# Patient Record
Sex: Male | Born: 1965 | Race: White | Hispanic: No | Marital: Single | Smoking: Former smoker
Health system: Southern US, Community
[De-identification: ages and names within clinical notes are randomized; demographics above are authoritative.]

## PROBLEM LIST (undated history)

## (undated) DIAGNOSIS — I10 Essential (primary) hypertension: Secondary | ICD-10-CM

## (undated) DIAGNOSIS — E119 Type 2 diabetes mellitus without complications: Secondary | ICD-10-CM

## (undated) DIAGNOSIS — J449 Chronic obstructive pulmonary disease, unspecified: Secondary | ICD-10-CM

## (undated) DIAGNOSIS — G4730 Sleep apnea, unspecified: Secondary | ICD-10-CM

## (undated) DIAGNOSIS — E785 Hyperlipidemia, unspecified: Secondary | ICD-10-CM

## (undated) DIAGNOSIS — K279 Peptic ulcer, site unspecified, unspecified as acute or chronic, without hemorrhage or perforation: Secondary | ICD-10-CM

## (undated) DIAGNOSIS — I2510 Atherosclerotic heart disease of native coronary artery without angina pectoris: Secondary | ICD-10-CM

## (undated) DIAGNOSIS — G629 Polyneuropathy, unspecified: Secondary | ICD-10-CM

## (undated) DIAGNOSIS — G5600 Carpal tunnel syndrome, unspecified upper limb: Secondary | ICD-10-CM

## (undated) HISTORY — DX: Type 2 diabetes mellitus without complications: E11.9

## (undated) HISTORY — DX: Hyperlipidemia, unspecified: E78.5

---

## 2004-11-05 ENCOUNTER — Emergency Department: Payer: Self-pay | Admitting: Emergency Medicine

## 2016-01-06 ENCOUNTER — Inpatient Hospital Stay: Payer: BLUE CROSS/BLUE SHIELD

## 2016-01-06 ENCOUNTER — Inpatient Hospital Stay
Admission: EM | Admit: 2016-01-06 | Discharge: 2016-01-09 | DRG: 300 | Disposition: A | Discharge status: Home / Self Care | Payer: BLUE CROSS/BLUE SHIELD | Attending: Internal Medicine | Admitting: Internal Medicine

## 2016-01-06 ENCOUNTER — Encounter: Payer: Self-pay | Admitting: Emergency Medicine

## 2016-01-06 DIAGNOSIS — L03116 Cellulitis of left lower limb: Secondary | ICD-10-CM | POA: Diagnosis present

## 2016-01-06 DIAGNOSIS — E785 Hyperlipidemia, unspecified: Secondary | ICD-10-CM | POA: Diagnosis present

## 2016-01-06 DIAGNOSIS — Z91013 Allergy to seafood: Secondary | ICD-10-CM

## 2016-01-06 DIAGNOSIS — Z6841 Body Mass Index (BMI) 40.0 and over, adult: Secondary | ICD-10-CM

## 2016-01-06 DIAGNOSIS — Z7951 Long term (current) use of inhaled steroids: Secondary | ICD-10-CM

## 2016-01-06 DIAGNOSIS — Z885 Allergy status to narcotic agent status: Secondary | ICD-10-CM | POA: Diagnosis not present

## 2016-01-06 DIAGNOSIS — Z833 Family history of diabetes mellitus: Secondary | ICD-10-CM

## 2016-01-06 DIAGNOSIS — Z8711 Personal history of peptic ulcer disease: Secondary | ICD-10-CM

## 2016-01-06 DIAGNOSIS — J449 Chronic obstructive pulmonary disease, unspecified: Secondary | ICD-10-CM | POA: Diagnosis present

## 2016-01-06 DIAGNOSIS — E662 Morbid (severe) obesity with alveolar hypoventilation: Secondary | ICD-10-CM | POA: Diagnosis present

## 2016-01-06 DIAGNOSIS — Z800 Family history of malignant neoplasm of digestive organs: Secondary | ICD-10-CM

## 2016-01-06 DIAGNOSIS — R609 Edema, unspecified: Secondary | ICD-10-CM | POA: Diagnosis not present

## 2016-01-06 DIAGNOSIS — Z7982 Long term (current) use of aspirin: Secondary | ICD-10-CM

## 2016-01-06 DIAGNOSIS — I10 Essential (primary) hypertension: Secondary | ICD-10-CM | POA: Diagnosis present

## 2016-01-06 DIAGNOSIS — I872 Venous insufficiency (chronic) (peripheral): Principal | ICD-10-CM | POA: Diagnosis present

## 2016-01-06 DIAGNOSIS — L0390 Cellulitis, unspecified: Secondary | ICD-10-CM

## 2016-01-06 DIAGNOSIS — R0600 Dyspnea, unspecified: Secondary | ICD-10-CM | POA: Diagnosis not present

## 2016-01-06 DIAGNOSIS — I2510 Atherosclerotic heart disease of native coronary artery without angina pectoris: Secondary | ICD-10-CM | POA: Diagnosis present

## 2016-01-06 DIAGNOSIS — F17210 Nicotine dependence, cigarettes, uncomplicated: Secondary | ICD-10-CM | POA: Diagnosis present

## 2016-01-06 HISTORY — DX: Chronic obstructive pulmonary disease, unspecified: J44.9

## 2016-01-06 HISTORY — DX: Atherosclerotic heart disease of native coronary artery without angina pectoris: I25.10

## 2016-01-06 HISTORY — DX: Essential (primary) hypertension: I10

## 2016-01-06 HISTORY — DX: Sleep apnea, unspecified: G47.30

## 2016-01-06 HISTORY — DX: Peptic ulcer, site unspecified, unspecified as acute or chronic, without hemorrhage or perforation: K27.9

## 2016-01-06 LAB — CBC WITH DIFFERENTIAL/PLATELET
Basophils Absolute: 0.1 10*3/uL (ref 0–0.1)
Basophils Relative: 1 %
EOS ABS: 0.3 10*3/uL (ref 0–0.7)
Eosinophils Relative: 3 %
HCT: 46.4 % (ref 40.0–52.0)
HEMOGLOBIN: 16 g/dL (ref 13.0–18.0)
LYMPHS ABS: 1.5 10*3/uL (ref 1.0–3.6)
Lymphocytes Relative: 14 %
MCH: 29.6 pg (ref 26.0–34.0)
MCHC: 34.4 g/dL (ref 32.0–36.0)
MCV: 86.2 fL (ref 80.0–100.0)
MONO ABS: 0.8 10*3/uL (ref 0.2–1.0)
MONOS PCT: 7 %
NEUTROS PCT: 75 %
Neutro Abs: 7.9 10*3/uL — ABNORMAL HIGH (ref 1.4–6.5)
Platelets: 218 10*3/uL (ref 150–440)
RBC: 5.39 MIL/uL (ref 4.40–5.90)
RDW: 14.6 % — AB (ref 11.5–14.5)
WBC: 10.5 10*3/uL (ref 3.8–10.6)

## 2016-01-06 LAB — MRSA PCR SCREENING: MRSA by PCR: NEGATIVE

## 2016-01-06 LAB — BASIC METABOLIC PANEL
Anion gap: 8 (ref 5–15)
BUN: 17 mg/dL (ref 6–20)
CALCIUM: 9.3 mg/dL (ref 8.9–10.3)
CHLORIDE: 102 mmol/L (ref 101–111)
CO2: 25 mmol/L (ref 22–32)
CREATININE: 0.96 mg/dL (ref 0.61–1.24)
GFR calc Af Amer: >60 mL/min (ref >60)
GFR calc non Af Amer: >60 mL/min (ref >60)
Glucose, Bld: 110 mg/dL — ABNORMAL HIGH (ref 65–99)
Potassium: 4.5 mmol/L (ref 3.5–5.1)
SODIUM: 135 mmol/L (ref 135–145)

## 2016-01-06 MED ORDER — MORPHINE SULFATE (PF) 2 MG/ML IV SOLN
2.0000 mg | INTRAVENOUS | Status: DC | PRN
Start: 2016-01-06 — End: 2016-01-08
  Administered 2016-01-06: 2 mg via INTRAVENOUS
  Filled 2016-01-06 (×2): qty 1

## 2016-01-06 MED ORDER — HEPARIN SODIUM (PORCINE) 5000 UNIT/ML IJ SOLN
5000.0000 [IU] | Freq: Three times a day (TID) | INTRAMUSCULAR | Status: DC
  Administered 2016-01-06 – 2016-01-07 (×3): 5000 [IU] via SUBCUTANEOUS
  Filled 2016-01-06 (×3): qty 1

## 2016-01-06 MED ORDER — LOSARTAN POTASSIUM 50 MG PO TABS
50.0000 mg | ORAL_TABLET | Freq: Every day | ORAL | Status: DC
  Administered 2016-01-07 – 2016-01-09 (×3): 50 mg via ORAL
  Filled 2016-01-06 (×3): qty 1

## 2016-01-06 MED ORDER — BUPROPION HCL ER (SR) 150 MG PO TB12
150.0000 mg | ORAL_TABLET | Freq: Two times a day (BID) | ORAL | Status: DC
  Administered 2016-01-06 – 2016-01-09 (×6): 150 mg via ORAL
  Filled 2016-01-06 (×8): qty 1

## 2016-01-06 MED ORDER — LOSARTAN POTASSIUM-HCTZ 50-12.5 MG PO TABS: 1.0000 | ORAL_TABLET | Freq: Every day | ORAL | Status: DC

## 2016-01-06 MED ORDER — ASPIRIN-ACETAMINOPHEN-CAFFEINE 250-250-65 MG PO TABS: 1.0000 | ORAL_TABLET | Freq: Four times a day (QID) | ORAL | Status: DC | PRN

## 2016-01-06 MED ORDER — ATORVASTATIN CALCIUM 20 MG PO TABS
20.0000 mg | ORAL_TABLET | Freq: Every day | ORAL | Status: DC
  Administered 2016-01-07 – 2016-01-09 (×3): 20 mg via ORAL
  Filled 2016-01-06 (×3): qty 1

## 2016-01-06 MED ORDER — OXYBUTYNIN CHLORIDE ER 5 MG PO TB24
10.0000 mg | ORAL_TABLET | Freq: Every day | ORAL | Status: DC
  Administered 2016-01-06 – 2016-01-08 (×3): 10 mg via ORAL
  Filled 2016-01-06 (×3): qty 2

## 2016-01-06 MED ORDER — PIPERACILLIN-TAZOBACTAM 3.375 G IVPB 30 MIN
3.3750 g | Freq: Once | INTRAVENOUS | Status: AC
Start: 2016-01-06 — End: 2016-01-06
  Administered 2016-01-06: 3.375 g via INTRAVENOUS
  Filled 2016-01-06: qty 50

## 2016-01-06 MED ORDER — UMECLIDINIUM BROMIDE 62.5 MCG/INH IN AEPB
1.0000 | INHALATION_SPRAY | Freq: Every day | RESPIRATORY_TRACT | Status: DC
  Administered 2016-01-06 – 2016-01-09 (×4): 1 via RESPIRATORY_TRACT
  Filled 2016-01-06: qty 7

## 2016-01-06 MED ORDER — LORATADINE 10 MG PO TABS
10.0000 mg | ORAL_TABLET | Freq: Every day | ORAL | Status: DC
  Administered 2016-01-07 – 2016-01-09 (×3): 10 mg via ORAL
  Filled 2016-01-06 (×3): qty 1

## 2016-01-06 MED ORDER — VANCOMYCIN HCL IN DEXTROSE 1-5 GM/200ML-% IV SOLN
1000.0000 mg | Freq: Once | INTRAVENOUS | Status: AC
  Administered 2016-01-06: 1000 mg via INTRAVENOUS
  Filled 2016-01-06: qty 200

## 2016-01-06 MED ORDER — MORPHINE SULFATE (PF) 4 MG/ML IV SOLN
4.0000 mg | Freq: Once | INTRAVENOUS | Status: AC
Start: 2016-01-06 — End: 2016-01-06
  Administered 2016-01-06: 4 mg via INTRAVENOUS
  Filled 2016-01-06: qty 1

## 2016-01-06 MED ORDER — PIPERACILLIN-TAZOBACTAM 3.375 G IVPB
3.3750 g | Freq: Three times a day (TID) | INTRAVENOUS | Status: DC
  Administered 2016-01-06 – 2016-01-07 (×2): 3.375 g via INTRAVENOUS
  Filled 2016-01-06 (×2): qty 50

## 2016-01-06 MED ORDER — HYDROCHLOROTHIAZIDE 12.5 MG PO CAPS
12.5000 mg | ORAL_CAPSULE | Freq: Every day | ORAL | Status: DC
  Administered 2016-01-07 – 2016-01-08 (×2): 12.5 mg via ORAL
  Filled 2016-01-06 (×2): qty 1

## 2016-01-06 MED ORDER — ONDANSETRON HCL 4 MG/2ML IJ SOLN
4.0000 mg | Freq: Once | INTRAMUSCULAR | Status: AC
  Administered 2016-01-06: 4 mg via INTRAVENOUS
  Filled 2016-01-06: qty 2

## 2016-01-06 MED ORDER — VANCOMYCIN HCL 10 G IV SOLR
1250.0000 mg | Freq: Three times a day (TID) | INTRAVENOUS | Status: DC
  Administered 2016-01-06 – 2016-01-07 (×2): 1250 mg via INTRAVENOUS
  Filled 2016-01-06 (×4): qty 1250

## 2016-01-06 MED ORDER — ASPIRIN EC 325 MG PO TBEC
325.0000 mg | DELAYED_RELEASE_TABLET | Freq: Every day | ORAL | Status: DC
  Administered 2016-01-07 – 2016-01-09 (×3): 325 mg via ORAL
  Filled 2016-01-06 (×3): qty 1

## 2016-01-06 MED ORDER — OXYCODONE-ACETAMINOPHEN 5-325 MG PO TABS
1.0000 | ORAL_TABLET | ORAL | Status: DC | PRN
  Administered 2016-01-06 – 2016-01-08 (×6): 1 via ORAL
  Filled 2016-01-06 (×6): qty 1

## 2016-01-06 MED ORDER — PANTOPRAZOLE SODIUM 40 MG PO TBEC
40.0000 mg | DELAYED_RELEASE_TABLET | Freq: Two times a day (BID) | ORAL | Status: DC
  Administered 2016-01-06 – 2016-01-09 (×6): 40 mg via ORAL
  Filled 2016-01-06 (×6): qty 1

## 2016-01-06 MED ORDER — ACETAMINOPHEN 325 MG PO TABS: 650.0000 mg | ORAL_TABLET | Freq: Four times a day (QID) | ORAL | Status: DC | PRN

## 2016-01-06 MED ORDER — MOMETASONE FURO-FORMOTEROL FUM 200-5 MCG/ACT IN AERO
2.0000 | INHALATION_SPRAY | Freq: Two times a day (BID) | RESPIRATORY_TRACT | Status: DC
  Administered 2016-01-06 – 2016-01-09 (×6): 2 via RESPIRATORY_TRACT
  Filled 2016-01-06: qty 8.8

## 2016-01-06 MED ORDER — MORPHINE SULFATE (PF) 2 MG/ML IV SOLN
INTRAVENOUS | Status: DC
Start: 2016-01-06 — End: 2016-01-06
  Filled 2016-01-06: qty 1

## 2016-01-06 NOTE — ED Provider Notes (Signed)
[facility] Emergency Department Provider Note  ____________________________________________   First MD Initiated Contact with Patient 01/06/16 1142     (approximate)  I have reviewed the triage vital signs and the nursing notes.   HISTORY  Chief Complaint Leg Swelling   HPI [name] is a 50 y.o. male With a history of CAD as well as obesity, hypertension and venous insufficiency was presenting with left lower extremity swelling over the past 3-4 days. He says that the issue started with weeping from an anterior and posterior wound 3 days ago. Since then he has had erythema which is expanded up past his knee on the left. He denies fever. He says that there is a burning type pain. Says that he has had swelling in his lower extremities for years but has never had an issue quite as bad as this before.   Past Medical History:  Diagnosis Date  . COPD (chronic obstructive pulmonary disease) ([place])   . Coronary artery disease   . Hypertension     There are no active problems to display for this patient.   History reviewed. No pertinent surgical history.  Prior to Admission medications   Not on File    Allergies Shellfish allergy and Codeine  History reviewed. No pertinent family history.  Social History Social History  Substance Use Topics  . Smoking status: Current Every Day Smoker    Packs/day: 1.00    Years: 30.00    Types: Cigarettes  . Smokeless tobacco: Never Used  . Alcohol use No    Review of Systems Constitutional: No fever/chills Eyes: No visual changes. ENT: No sore throat. Cardiovascular: Denies chest pain. Respiratory: Denies shortness of breath. Gastrointestinal: No abdominal pain.  No nausea, no vomiting.  No diarrhea.  No constipation. Genitourinary: Negative for dysuria. Musculoskeletal: Negative for back pain. Skin: as above Neurological: Negative for headaches, focal weakness or numbness.  10-point ROS  otherwise negative.  ____________________________________________   PHYSICAL EXAM:  VITAL SIGNS: ED Triage Vitals  Enc Vitals Group     BP 01/06/16 1146 (!) 177/117     Pulse Rate 01/06/16 1146 93     Resp 01/06/16 1146 (!) 21     Temp 01/06/16 1146 97.9 F (36.6 C)     Temp Source 01/06/16 1146 Oral     SpO2 01/06/16 1146 99 %     Weight 01/06/16 1145 (!) 316 lb (143.3 kg)     Height 01/06/16 1145 5\' 9"  (1.753 m)     Head Circumference --      Peak Flow --      Pain Score 01/06/16 1146 7     Pain Loc --      Pain Edu? --      Excl. in [place]? --     Constitutional: Alert and oriented. Well appearing and in no acute distress. Eyes: Conjunctivae are normal. PERRL. EOMI. Head: Atraumatic. Nose: No congestion/rhinnorhea. Mouth/Throat: Mucous membranes are moist.   Neck: No stridor.   Cardiovascular: Normal rate, regular rhythm. Grossly normal heart sounds.  Good peripheral circulation equal, intact bilateral dorsalis pedis pulses. Respiratory: Normal respiratory effort.  No retractions. Lungs CTAB. Gastrointestinal: Soft and nontender. No distention.  Musculoskeletal:  Moderate right lower extremity edema.Moderate to severe left lower extremity edema with erythema from the left foot to just proximal to left knee. There is a weeping patch of skin that appears to be weeping pus that is anterior over the tibia just proximal to the Right  lower extremity just proximal to the ankle and about 5 x 6 cm square. There is also a posterior patch that appears similarly that is 10 x 12 cm and also weeping. There is no crepitus. Pain is not out of proportion to exam. Neurologic:  Normal speech and language. No gross focal neurologic deficits are appreciated. No gait instability. Skin:  Skin is warm, dry and intact. No rash noted. Psychiatric: Mood and affect are normal. Speech and behavior are normal.  ____________________________________________   LABS (all labs ordered are listed, but only  abnormal results are displayed)  Labs Reviewed  CBC WITH DIFFERENTIAL/PLATELET - Abnormal; Notable for the following:       Result Value   RDW 14.6 (*)    Neutro Abs 7.9 (*)    All other components within normal limits  BASIC METABOLIC PANEL - Abnormal; Notable for the following:    Glucose, Bld 110 (*)    All other components within normal limits  CULTURE, BLOOD (ROUTINE X 2)  CULTURE, BLOOD (ROUTINE X 2)   ____________________________________________  EKG   ____________________________________________  RADIOLOGY   ____________________________________________   PROCEDURES  Procedure(s) performed:   Procedures  Critical Care performed:   ____________________________________________   INITIAL IMPRESSION / ASSESSMENT AND PLAN / ED COURSE  Pertinent labs & imaging results that were available during my care of the patient were reviewed by me and considered in my medical decision making (see chart for details).   Clinical Course   ----------------------------------------- 12:59 PM on 01/06/2016 -----------------------------------------  Patient signed out to Dr. Anselm Jungling. Likely cellulitis. However, normal white blood cell count. We  We'll test for blood clot as well. Patient were made for admission. Likely cellulitis is greater than 50% of the left lower extremity.   ____________________________________________   FINAL CLINICAL IMPRESSION(S) / ED DIAGNOSES  Cellulitis.    NEW MEDICATIONS STARTED DURING THIS VISIT:  New Prescriptions   No medications on file     Note:  This document was prepared using Dragon voice recognition software and may include unintentional dictation errors.    Orbie Pyo, MD 01/06/16 1300

## 2016-01-06 NOTE — ED Notes (Signed)
Patient transported to Ultrasound 

## 2016-01-06 NOTE — ED Notes (Signed)
This nurse attempted to start an IV x2 for patient unsuccessfully.

## 2016-01-06 NOTE — H&P (Addendum)
[facility] [place] NAME: [name]    MR#:  [id number]  DATE OF BIRTH:  Jun 12, 1965  DATE OF ADMISSION:  01/06/2016  PRIMARY CARE PHYSICIAN: No PCP Per Patient   REQUESTING/REFERRING PHYSICIAN: [place]  CHIEF COMPLAINT:   Chief Complaint  Patient presents with  . Leg Swelling    HISTORY OF PRESENT ILLNESS: [name]  is a 50 y.o. male with a known history of COPD, coronary artery disease, hypertension, peptic ulcer disease, sleep apnea- was having swelling on his legs for few days but for last 3 days his left leg started getting red and it was tender so he decided to come to emergency room with that. He was noted to have redness up to his knee on left leg and there was some superficial abscess on that leg so he is given his admission to hospitalist team.  PAST MEDICAL HISTORY:   Past Medical History:  Diagnosis Date  . COPD (chronic obstructive pulmonary disease) ([place])   . Coronary artery disease   . Hypertension   . Peptic ulcer   . Sleep apnea     PAST SURGICAL HISTORY: History reviewed. No pertinent surgical history.  SOCIAL HISTORY:  Social History  Substance Use Topics  . Smoking status: Current Every Day Smoker    Packs/day: 1.00    Years: 30.00    Types: Cigarettes  . Smokeless tobacco: Never Used  . Alcohol use No    FAMILY HISTORY:  Family History  Problem Relation Age of Onset  . Diabetes Mother   . Diabetes Father   . Stomach cancer Father     DRUG ALLERGIES:  Allergies  Allergen Reactions  . Shellfish Allergy Swelling  . Codeine Rash    REVIEW OF SYSTEMS:   CONSTITUTIONAL: No fever, fatigue or weakness.  EYES: No blurred or double vision.  EARS, NOSE, AND THROAT: No tinnitus or ear pain.  RESPIRATORY: No cough, shortness of breath, wheezing or hemoptysis.  CARDIOVASCULAR: No chest pain, orthopnea, edema.  GASTROINTESTINAL: No nausea, vomiting, diarrhea or abdominal pain.   GENITOURINARY: No dysuria, hematuria.  ENDOCRINE: No polyuria, nocturia,  HEMATOLOGY: No anemia, easy bruising or bleeding SKIN: No rash or lesion. MUSCULOSKELETAL: No joint pain or arthritis.  He has pain in left leg. NEUROLOGIC: No tingling, numbness, weakness.  PSYCHIATRY: No anxiety or depression.   MEDICATIONS AT HOME:  Prior to Admission medications   Medication Sig Start Date End Date Taking? Authorizing Provider  aspirin 325 MG EC tablet Take 325 mg by mouth daily.   Yes Historical Provider, MD  aspirin-acetaminophen-caffeine (EXCEDRIN MIGRAINE) [phone] MG tablet Take by mouth every 6 (six) hours as needed for headache.   Yes Historical Provider, MD  atorvastatin (LIPITOR) 20 MG tablet Take 20 mg by mouth daily.   Yes Historical Provider, MD  buPROPion (WELLBUTRIN SR) 150 MG 12 hr tablet Take 150 mg by mouth 2 (two) times daily.   Yes Historical Provider, MD  Fluticasone-Salmeterol (ADVAIR DISKUS) 250-50 MCG/DOSE AEPB Inhale 1 puff into the lungs 2 (two) times daily.   Yes Historical Provider, MD  loratadine (CLARITIN) 10 MG tablet Take 10 mg by mouth daily.   Yes Historical Provider, MD  losartan-hydrochlorothiazide (HYZAAR) 50-12.5 MG tablet Take 1 tablet by mouth daily.   Yes Historical Provider, MD  omeprazole (PRILOSEC) 20 MG capsule Take 20 mg by mouth 2 (two) times daily.   Yes Historical Provider, MD  oxybutynin (DITROPAN-XL) 10 MG 24 hr tablet Take  10 mg by mouth at bedtime.   Yes Historical Provider, MD  Potassium Gluconate 550 MG TABS Take 1 tablet by mouth daily.   Yes Historical Provider, MD  umeclidinium bromide (INCRUSE ELLIPTA) 62.5 MCG/INH AEPB Inhale 1 puff into the lungs daily.   Yes Historical Provider, MD      PHYSICAL EXAMINATION:   VITAL SIGNS: Blood pressure 137/78, pulse 96, temperature 97.8 F (36.6 C), temperature source Oral, resp. rate (!) 24, height 5\' 9"  (1.753 m), weight (!) 143.3 kg (316 lb), SpO2 97 %.  GENERAL:  50 y.o.-year-old Obese  patient lying in the bed with no acute distress.  EYES: Pupils equal, round, reactive to light and accommodation. No scleral icterus. Extraocular muscles intact.  HEENT: Head atraumatic, normocephalic. Oropharynx and nasopharynx clear.  NECK:  Supple, no jugular venous distention. No thyroid enlargement, no tenderness.  LUNGS: Normal breath sounds bilaterally, no wheezing, rales,rhonchi or crepitation. No use of accessory muscles of respiration.  CARDIOVASCULAR: S1, S2 normal. No murmurs, rubs, or gallops.  ABDOMEN: Soft, nontender, nondistended. Bowel sounds present. No organomegaly or mass.  EXTREMITIES: Bilateral pedal edema, no cyanosis, left leg has redness up to his knee with some superficial skin breakdown in anterior part of middle part of her leg with open ulcer-like area and superficial yellowish discharge in the size of 5 x 6 cm.  NEUROLOGIC: Cranial nerves II through XII are intact. Muscle strength 5/5 in all extremities. Sensation intact. Gait not checked.  PSYCHIATRIC: The patient is alert and oriented x 3.  SKIN: No obvious rash, lesion, or ulcer.   LABORATORY PANEL:   CBC  Recent Labs Lab 01/06/16 1205  WBC 10.5  HGB 16.0  HCT 46.4  PLT 218  MCV 86.2  MCH 29.6  MCHC 34.4  RDW 14.6*  LYMPHSABS 1.5  MONOABS 0.8  EOSABS 0.3  BASOSABS 0.1   ------------------------------------------------------------------------------------------------------------------  Chemistries   Recent Labs Lab 01/06/16 1205  NA 135  K 4.5  CL 102  CO2 25  GLUCOSE 110*  BUN 17  CREATININE 0.96  CALCIUM 9.3   ------------------------------------------------------------------------------------------------------------------ estimated creatinine clearance is 129.8 mL/min (by C-G formula based on SCr of 0.96 mg/dL). ------------------------------------------------------------------------------------------------------------------ No results for input(s): TSH, T4TOTAL, T3FREE, THYROIDAB  in the last 72 hours.  Invalid input(s): FREET3   Coagulation profile No results for input(s): INR, PROTIME in the last 168 hours. ------------------------------------------------------------------------------------------------------------------- No results for input(s): DDIMER in the last 72 hours. -------------------------------------------------------------------------------------------------------------------  Cardiac Enzymes No results for input(s): CKMB, TROPONINI, MYOGLOBIN in the last 168 hours.  Invalid input(s): CK ------------------------------------------------------------------------------------------------------------------ Invalid input(s): POCBNP  ---------------------------------------------------------------------------------------------------------------  Urinalysis No results found for: COLORURINE, APPEARANCEUR, LABSPEC, PHURINE, GLUCOSEU, HGBUR, BILIRUBINUR, KETONESUR, PROTEINUR, UROBILINOGEN, NITRITE, LEUKOCYTESUR   RADIOLOGY: US Venous Img Lower Unilateral Left  Result Date: 01/06/2016 CLINICAL DATA:  Left lower extremity swelling and erythema since last night EXAM: LEFT LOWER EXTREMITY VENOUS DUPLEX ULTRASOUND TECHNIQUE: Doppler venous assessment of the left lower extremity deep venous system was performed, including characterization of spectral flow, compressibility, and phasicity. COMPARISON:  None. FINDINGS: There is complete compressibility of the left common femoral, femoral, and popliteal veins. Doppler analysis demonstrates respiratory phasicity and augmentation of flow with calf compression. No obvious superficial vein or calf vein thrombosis. IMPRESSION: No evidence of left lower extremity DVT. Electronically Signed   By: Marybelle Killings M.D.   On: 01/06/2016 15:02    EKG: No orders found for this or any previous visit.  IMPRESSION AND PLAN:  * Cellulitis   IV vancomycin and  Zosyn for now.   Check for MRSA screen.    Check ultrasound DVT  studies.  *  COPD   No active exacerbation   Continue inhalers.  * Hypertension   Continue home medication.  * Hyperlipidemia   Continue atorvastatin.  * Active smoking   Counseled to quit smoking for 4 minutes and offered nicotine patch.  All the records are reviewed and case discussed with ED provider. Management plans discussed with the patient, family and they are in agreement.  CODE STATUS:Full code     Code Status Orders        Start     Ordered   01/06/16 1526  Full code  Continuous     01/06/16 1525    Code Status History    Date Active Date Inactive Code Status Order ID Comments User Context   This patient has a current code status but no historical code status.     Patient's sister was present in the room during my visit.   TOTAL TIME TAKING CARE OF THIS PATIENT: 50 min.    Vaughan Basta M.D on 01/06/2016   Between 7am to 6pm - Pager - [phone]  After 6pm go to [email or web address] - password [email or web address] [place] [facility]  Office  [phone]  CC: Primary care physician; No PCP Per Patient   Note: This dictation was prepared with Dragon dictation along with smaller phrase technology. Any transcriptional errors that result from this process are unintentional.

## 2016-01-06 NOTE — ED Triage Notes (Signed)
Pt presents to ED c/o left leg swelling, weeping since last night. Warm to touch with redness; oozing present from site. Denies fever.

## 2016-01-06 NOTE — Progress Notes (Signed)
Pharmacy Antibiotic Note  [name] is a 50 y.o. male admitted on 01/06/2016 with  cellulitis.  Pharmacy has been consulted for Vancomycin and Zosyn dosing. Patient received Vancomycin 1gm IV x1 and Zosyn 3.375 IV x 1 in ED.   Plan: DW: 100kg  Ke: 0.104  Vd:70   T1/2: 6.7  Will start patient on Vancomycin 1250mg  IV every 8 hours with 6 hours stack dosing. Calculated trough at Css 14. Trough ordered prior to 5th dose. Will monitor renal function and adjust dose as needed.    Will start patient on Zosyn 3.375 IV EI every 8 hours.   Height: 5\' 9"  ([id number] cm) Weight: (!) 316 lb (143.3 kg) IBW/kg (Calculated) : 70.7  Temp (24hrs), Avg:97.9 F (36.6 C), Min:97.9 F (36.6 C), Max:97.9 F (36.6 C)   Recent Labs Lab 01/06/16 1205  WBC 10.5  CREATININE 0.96    Estimated Creatinine Clearance: 129.8 mL/min (by C-G formula based on SCr of 0.96 mg/dL).    Allergies  Allergen Reactions  . Shellfish Allergy Swelling  . Codeine Rash    Antimicrobials this admission: 12/25 Vancomycin  >>  12/25 Zosyn  >>   Dose adjustments this admission:  Microbiology results: 12/25 BCx:   Thank you for allowing pharmacy to be a part of this patient's care.  Pernell Dupre, PharmD, BCPS Clinical Pharmacist 01/06/2016 2:28 PM

## 2016-01-07 LAB — BASIC METABOLIC PANEL
Anion gap: 6 (ref 5–15)
BUN: 14 mg/dL (ref 6–20)
CO2: 29 mmol/L (ref 22–32)
CREATININE: 1.15 mg/dL (ref 0.61–1.24)
Calcium: 9 mg/dL (ref 8.9–10.3)
Chloride: 103 mmol/L (ref 101–111)
GFR calc Af Amer: >60 mL/min (ref >60)
Glucose, Bld: 113 mg/dL — ABNORMAL HIGH (ref 65–99)
Potassium: 4.8 mmol/L (ref 3.5–5.1)
Sodium: 138 mmol/L (ref 135–145)

## 2016-01-07 LAB — CBC
HCT: 43.7 % (ref 40.0–52.0)
Hemoglobin: 14.8 g/dL (ref 13.0–18.0)
MCH: 29.1 pg (ref 26.0–34.0)
MCHC: 33.7 g/dL (ref 32.0–36.0)
MCV: 86.1 fL (ref 80.0–100.0)
PLATELETS: 209 10*3/uL (ref 150–440)
RBC: 5.08 MIL/uL (ref 4.40–5.90)
RDW: 14.8 % — AB (ref 11.5–14.5)
WBC: 9.2 10*3/uL (ref 3.8–10.6)

## 2016-01-07 MED ORDER — DEXTROSE 5 % IV SOLN
1.0000 g | INTRAVENOUS | Status: DC
  Filled 2016-01-07: qty 10

## 2016-01-07 MED ORDER — VANCOMYCIN HCL 10 G IV SOLR
1500.0000 mg | Freq: Two times a day (BID) | INTRAVENOUS | Status: DC
  Administered 2016-01-07 – 2016-01-08 (×2): 1500 mg via INTRAVENOUS
  Filled 2016-01-07 (×3): qty 1500

## 2016-01-07 MED ORDER — ASPIRIN-ACETAMINOPHEN-CAFFEINE 250-250-65 MG PO TABS
2.0000 | ORAL_TABLET | Freq: Four times a day (QID) | ORAL | Status: DC | PRN
  Administered 2016-01-07 – 2016-01-09 (×3): 2 via ORAL
  Filled 2016-01-07 (×6): qty 2

## 2016-01-07 MED ORDER — CEFTRIAXONE SODIUM-DEXTROSE 1-3.74 GM-% IV SOLR
1.0000 g | INTRAVENOUS | Status: DC
  Administered 2016-01-07: 1 g via INTRAVENOUS
  Filled 2016-01-07 (×2): qty 50

## 2016-01-07 MED ORDER — NICOTINE 21 MG/24HR TD PT24
21.0000 mg | MEDICATED_PATCH | Freq: Every day | TRANSDERMAL | Status: DC
  Administered 2016-01-07: 21 mg via TRANSDERMAL
  Filled 2016-01-07 (×2): qty 1

## 2016-01-07 MED ORDER — ENOXAPARIN SODIUM 40 MG/0.4ML ~~LOC~~ SOLN
40.0000 mg | Freq: Two times a day (BID) | SUBCUTANEOUS | Status: DC
  Administered 2016-01-07 – 2016-01-09 (×4): 40 mg via SUBCUTANEOUS
  Filled 2016-01-07 (×4): qty 0.4

## 2016-01-07 NOTE — Progress Notes (Signed)
Pharmacy Antibiotic Note  [name] is a 50 y.o. male admitted on 01/06/2016 with  cellulitis.  Pharmacy has been consulted for Vancomycin and Zosyn dosing. Patient received Vancomycin 1gm IV x1 and Zosyn 3.375 IV x 1 in ED.   Plan: DW: 100kg  Ke: 0.104  Vd:70   T1/2: 6.7 Will start patient on Vancomycin 1250mg  IV every 8 hours with 6 hours stack dosing. Calculated trough at Css 14. Trough ordered prior to 5th dose. Will monitor renal function and adjust dose as needed.     12/26: SCr trending up leading to significant change in CrCl, pt at risk for dose accumulation, and current dose suggests trough level >20. Will adjust dose to vancomycin 1500 mg IV q12h. Trough before 3rd dose 12/27 at 1630 to see where we are with pt previously receiving q8h dosing (will not be at Css). Ke 0.069, half life 10 h, Vd 70 L.    Will continue Zosyn 3.375 IV EI every 8 hours.    Height: 5\' 9"  (175.3 cm) Weight: (!) 316 lb (143.3 kg) IBW/kg (Calculated) : 70.7  Temp (24hrs), Avg:98.1 F (36.7 C), Min:97.8 F (36.6 C), Max:98.4 F (36.9 C)   Recent Labs Lab 01/06/16 1205 01/07/16 0500  WBC 10.5 9.2  CREATININE 0.96 1.15    Estimated Creatinine Clearance: 108.4 mL/min (by C-G formula based on SCr of 1.15 mg/dL).    Allergies  Allergen Reactions  . Shellfish Allergy Swelling  . Codeine Rash    Antimicrobials this admission: 12/25 Vancomycin  >>  12/25 Zosyn  >>   Dose adjustments this admission:  Microbiology results: 12/25 BCx: NGTD  Thank you for allowing pharmacy to be a part of this patient's care.  Rocky Morel, PharmD, BCPS Clinical Pharmacist 01/07/2016 11:04 AM

## 2016-01-07 NOTE — Progress Notes (Signed)
Patient ID: [name], male   DOB: Jun 05, 1965, 50 y.o.   MRN: [id number]  [employer] [facility] PROGRESS NOTE  SEVAG DIEBEL [id number] DOB: [birth year] DOA: 01/06/2016 PCP: No PCP Per Patient  HPI/Subjective: Patient feeling okay. Had severe burning and pain in his legs. He noticed his legs cracking and then infection happened in his left leg. He had a lot of drainage prior to coming into the hospital. This morning there was less drainage. His legs were covered with Unna boots when I saw him.  Objective: Vitals:   01/07/16 0501 01/07/16 1253  BP: 125/73 134/71  Pulse: 97 94  Resp:  (!) 21  Temp: 98.4 F (36.9 C) 98.3 F (36.8 C)    Filed Weights   01/06/16 1145  Weight: (!) 143.3 kg (316 lb)    ROS: Review of Systems  Constitutional: Negative for chills and fever.  Eyes: Negative for blurred vision.  Respiratory: Positive for shortness of breath. Negative for cough.   Cardiovascular: Negative for chest pain.  Gastrointestinal: Negative for abdominal pain, constipation, diarrhea, nausea and vomiting.  Genitourinary: Negative for dysuria.  Musculoskeletal: Positive for myalgias. Negative for joint pain.  Neurological: Negative for dizziness and headaches.   Exam: Physical Exam  Constitutional: He is oriented to person, place, and time.  HENT:  Nose: No mucosal edema.  Mouth/Throat: No oropharyngeal exudate or posterior oropharyngeal edema.  Eyes: Conjunctivae, EOM and lids are normal. Pupils are equal, round, and reactive to light.  Neck: No JVD present. Carotid bruit is not present. No edema present. No thyroid mass and no thyromegaly present.  Cardiovascular: S1 normal and S2 normal.  Exam reveals no gallop.   No murmur heard. Pulses:      Dorsalis pedis pulses are 2+ on the right side, and 2+ on the left side.  Respiratory: No respiratory distress. He has decreased breath sounds in the right lower field and the left lower field. He has no wheezes. He has no  rhonchi. He has no rales.  GI: Soft. Bowel sounds are normal. There is no tenderness.  Musculoskeletal:       Right ankle: He exhibits swelling.       Left ankle: He exhibits swelling.  Lymphadenopathy:    He has no cervical adenopathy.  Neurological: He is alert and oriented to person, place, and time. No cranial nerve deficit.  Skin: Skin is warm. Nails show no clubbing.  Legs covered by Louretta Parma boot  Psychiatric: He has a normal mood and affect.      Data Reviewed: Basic Metabolic Panel:  Recent Labs Lab 01/06/16 1205 01/07/16 0500  NA 135 138  K 4.5 4.8  CL 102 103  CO2 25 29  GLUCOSE 110* 113*  BUN 17 14  CREATININE 0.96 1.15  CALCIUM 9.3 9.0  CBC:  Recent Labs Lab 01/06/16 1205 01/07/16 0500  WBC 10.5 9.2  NEUTROABS 7.9*  --   HGB 16.0 14.8  HCT 46.4 43.7  MCV 86.2 86.1  PLT 218 209     Recent Results (from the past 240 hour(s))  Blood culture (routine x 2)     Status: None (Preliminary result)   Collection Time: 01/06/16 12:06 PM  Result Value Ref Range Status   Specimen Description BLOOD LEFT ANTECUBITAL  Final   Special Requests   Final    BOTTLES DRAWN AEROBIC AND ANAEROBIC  AER 10CC ANA 8CC   Culture NO GROWTH < 24 HOURS  Final   Report Status PENDING  Incomplete  Blood culture (routine x 2)     Status: None (Preliminary result)   Collection Time: 01/06/16 12:25 PM  Result Value Ref Range Status   Specimen Description BLOOD RIGHT HAND  Final   Special Requests   Final    BOTTLES DRAWN AEROBIC AND ANAEROBIC  AER [place] ANA 11CC   Culture NO GROWTH < 24 HOURS  Final   Report Status PENDING  Incomplete  MRSA PCR Screening     Status: None   Collection Time: 01/06/16  2:18 PM  Result Value Ref Range Status   MRSA by PCR NEGATIVE NEGATIVE Final    Comment:        The GeneXpert MRSA Assay (FDA approved for NASAL specimens only), is one component of a comprehensive MRSA colonization surveillance program. It is not intended to diagnose  MRSA infection nor to guide or monitor treatment for MRSA infections.      Studies: US Venous Img Lower Unilateral Left  Result Date: 01/06/2016 CLINICAL DATA:  Left lower extremity swelling and erythema since last night EXAM: LEFT LOWER EXTREMITY VENOUS DUPLEX ULTRASOUND TECHNIQUE: Doppler venous assessment of the left lower extremity deep venous system was performed, including characterization of spectral flow, compressibility, and phasicity. COMPARISON:  None. FINDINGS: There is complete compressibility of the left common femoral, femoral, and popliteal veins. Doppler analysis demonstrates respiratory phasicity and augmentation of flow with calf compression. No obvious superficial vein or calf vein thrombosis. IMPRESSION: No evidence of left lower extremity DVT. Electronically Signed   By: Marybelle Killings M.D.   On: 01/06/2016 15:02    Scheduled Meds: . aspirin  325 mg Oral Daily  . atorvastatin  20 mg Oral Daily  . buPROPion  150 mg Oral BID  . cefTRIAXone  1 g Intravenous Q24H  . enoxaparin (LOVENOX) injection  40 mg Subcutaneous Q12H  . losartan  50 mg Oral Daily   And  . hydrochlorothiazide  12.5 mg Oral Daily  . loratadine  10 mg Oral Daily  . mometasone-formoterol  2 puff Inhalation BID  . oxybutynin  10 mg Oral QHS  . pantoprazole  40 mg Oral BID  . umeclidinium bromide  1 puff Inhalation Daily  . vancomycin  1,500 mg Intravenous Q12H    Assessment/Plan:  1. 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 It's covered by Louretta Parma boot can even evaluate today. Patient on vancomycin and Zosyn and I will switch the Zosyn over to Rocephin. Need to evaluate this prior to disposition whether I take the Unna boots off tomorrow the next day will depend on clinical course. 2. History of COPD. Respiratory status stable 3. Essential hypertension continue usual medications 4. Morbid obesity weight loss needed  Code Status:     Code Status Orders        Start     Ordered    01/06/16 1526  Full code  Continuous     01/06/16 1525    Code Status History    Date Active Date Inactive Code Status Order ID Comments User Context   This patient has a current code status but no historical code status.      Disposition Plan: To be determined  Antibiotics:  Vancomycin  Rocephin  Time spent: 28 minutes  Loletha Grayer  [employer]

## 2016-01-07 NOTE — Consult Note (Signed)
[facility] Nurse wound consult note Reason for Consult: Cellulitis with edema from toes to below knee.  History of mixed venous and arterial disease. Positive pedal pulses present bilaterally.   Has worn compression garments in the past.  Will apply Unnas boots today to relieve edema.  Boil to right inner thigh near groin.  On vancomycin.  Will keep clean and covered.  Wound type:Infectious Pressure Ulcer POA: N/A Measurement:Right thigh 1 cm x 0.5 cm x 0.2 cm purulent drainage.  Left anterior lower leg 4 cm x 3 cm x 0.1 cm  LEft posterior leg 3 cm x 3 cm x 0.1 cm  Right anterior lower leg 2 cm x 2 cm blistering.  Wound [id number] discoloration to both legs.  Hemosiderin staining is present.  Drainage (amount, consistency, odor) Minimal serous weeping.  Periwound:Edema, erythema, tender to touch. Dry skin Dressing procedure/placement/frequency:Cleanse bilateral legs with soap and water.  Calcium alginate to open blistering.  ZInc layer secured with self adherent Coban.  Will remove prior to discharge.  Will not follow at this time.  Please re-consult if needed.  Domenic Moras RN BSN [place] Pager [phone]

## 2016-01-07 NOTE — Progress Notes (Signed)
Per Dr. Leslye Peer okay to place order for nicotine patch 21mg  daily

## 2016-01-07 NOTE — Progress Notes (Signed)
Pt alert and sitting up. 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 [facility] is available.   01/07/16 1130  Clinical Encounter Type  Visited With Patient  Visit Type Initial  Referral From Nurse  Spiritual Encounters  Spiritual Needs Emotional  Stress Factors  Patient Stress Factors Health changes

## 2016-01-07 NOTE — Plan of Care (Signed)
Problem: Safety: Goal: Ability to remain free from injury will improve Outcome: Progressing Re-enforced the need for the patient to call for assistance when getting out of bed, he indicated understanding

## 2016-01-08 ENCOUNTER — Inpatient Hospital Stay: Admit: 2016-01-08 | Payer: BLUE CROSS/BLUE SHIELD

## 2016-01-08 ENCOUNTER — Inpatient Hospital Stay (HOSPITAL_COMMUNITY)
Admit: 2016-01-08 | Discharge: 2016-01-08 | Disposition: A | Discharge status: Home / Self Care | Payer: BLUE CROSS/BLUE SHIELD | Attending: Internal Medicine | Admitting: Internal Medicine

## 2016-01-08 DIAGNOSIS — R0600 Dyspnea, unspecified: Secondary | ICD-10-CM

## 2016-01-08 MED ORDER — MORPHINE SULFATE (PF) 4 MG/ML IV SOLN
2.0000 mg | INTRAVENOUS | Status: DC | PRN
  Administered 2016-01-08 – 2016-01-09 (×2): 2 mg via INTRAVENOUS
  Filled 2016-01-08 (×2): qty 1

## 2016-01-08 MED ORDER — FUROSEMIDE 40 MG PO TABS
40.0000 mg | ORAL_TABLET | Freq: Every day | ORAL | Status: DC
  Administered 2016-01-08 – 2016-01-09 (×2): 40 mg via ORAL
  Filled 2016-01-08 (×2): qty 1

## 2016-01-08 MED ORDER — PERFLUTREN LIPID MICROSPHERE
1.0000 mL | INTRAVENOUS | Status: AC | PRN
  Administered 2016-01-08: 2 mL via INTRAVENOUS
  Filled 2016-01-08: qty 10

## 2016-01-08 MED ORDER — CEFAZOLIN IN D5W 1 GM/50ML IV SOLN
1.0000 g | Freq: Three times a day (TID) | INTRAVENOUS | Status: DC
  Administered 2016-01-08 – 2016-01-09 (×3): 1 g via INTRAVENOUS
  Filled 2016-01-08 (×7): qty 50

## 2016-01-08 NOTE — Progress Notes (Signed)
Pt alert and sitting in recliner. 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 [facility] is available.   01/08/16 1100  Clinical Encounter Type  Visited With Patient  Visit Type Follow-up  Referral From Chaplain  Spiritual Encounters  Spiritual Needs Emotional  Stress Factors  Patient Stress Factors None identified

## 2016-01-08 NOTE — Progress Notes (Signed)
Patient ID: [name], male   DOB: September 27, 1965, 50 y.o.   MRN: [id number]   [facility] PROGRESS NOTE  [name] [id number] DOB: July 15, 1965 DOA: 01/06/2016 PCP: No PCP Per Patient  HPI/Subjective: I had the nurse take off the Unna boots so I can evaluate his cellulitis. The patient states it started bleeding when they took it off on his left leg. He feels less pain in his calves at this point. Still has some redness on his left calf. He does have some chronic discoloration bilateral shins.  Objective: Vitals:   01/08/16 0755 01/08/16 1322  BP: (!) 119/51 138/75  Pulse: 91 84  Resp: 18 18  Temp:  98.3 F (36.8 C)    Filed Weights   01/06/16 1145  Weight: (!) 143.3 kg (316 lb)    ROS: Review of Systems  Constitutional: Negative for chills and fever.  Eyes: Negative for blurred vision.  Respiratory: Positive for shortness of breath. Negative for cough.   Cardiovascular: Negative for chest pain.  Gastrointestinal: Negative for abdominal pain, constipation, diarrhea, nausea and vomiting.  Genitourinary: Negative for dysuria.  Musculoskeletal: Positive for myalgias. Negative for joint pain.  Neurological: Negative for dizziness and headaches.   Exam: Physical Exam  Constitutional: He is oriented to person, place, and time.  HENT:  Nose: No mucosal edema.  Mouth/Throat: No oropharyngeal exudate or posterior oropharyngeal edema.  Eyes: Conjunctivae, EOM and lids are normal. Pupils are equal, round, and reactive to light.  Neck: No JVD present. Carotid bruit is not present. No edema present. No thyroid mass and no thyromegaly present.  Cardiovascular: S1 normal and S2 normal.  Exam reveals no gallop.   No murmur heard. Pulses:      Dorsalis pedis pulses are 2+ on the right side, and 2+ on the left side.  Respiratory: No respiratory distress. He has decreased breath sounds in the right lower field and the left lower field. He has no wheezes. He has no  rhonchi. He has no rales.  GI: Soft. Bowel sounds are normal. There is no tenderness.  Musculoskeletal:       Right ankle: He exhibits swelling.       Left ankle: He exhibits swelling.  Lymphadenopathy:    He has no cervical adenopathy.  Neurological: He is alert and oriented to person, place, and time. No cranial nerve deficit.  Skin: Skin is warm. Nails show no clubbing.  Right leg is all chronic lower extremity discoloration. Left leg has 2 areas of ulceration 1 in the back with slight irritation from bleeding. The one in the front is starting to form a scab with a greenish type discoloration. The patient does have surrounding erythema on his calf and shin. The patient thinks it's better than it was when he came in.  Psychiatric: He has a normal mood and affect.      Data Reviewed: Basic Metabolic Panel:  Recent Labs Lab 01/06/16 1205 01/07/16 0500  NA 135 138  K 4.5 4.8  CL 102 103  CO2 25 29  GLUCOSE 110* 113*  BUN 17 14  CREATININE 0.96 1.15  CALCIUM 9.3 9.0  CBC:  Recent Labs Lab 01/06/16 1205 01/07/16 0500  WBC 10.5 9.2  NEUTROABS 7.9*  --   HGB 16.0 14.8  HCT 46.4 43.7  MCV 86.2 86.1  PLT 218 209     Recent Results (from the past 240 hour(s))  Blood culture (routine x 2)     Status: None (Preliminary result)  Collection Time: 01/06/16 12:06 PM  Result Value Ref Range Status   Specimen Description BLOOD LEFT ANTECUBITAL  Final   Special Requests   Final    BOTTLES DRAWN AEROBIC AND ANAEROBIC  AER 10CC ANA 8CC   Culture NO GROWTH 2 DAYS  Final   Report Status PENDING  Incomplete  Blood culture (routine x 2)     Status: None (Preliminary result)   Collection Time: 01/06/16 12:25 PM  Result Value Ref Range Status   Specimen Description BLOOD RIGHT HAND  Final   Special Requests   Final    BOTTLES DRAWN AEROBIC AND ANAEROBIC  AER [place] ANA 11CC   Culture NO GROWTH 2 DAYS  Final   Report Status PENDING  Incomplete  MRSA PCR Screening     Status: None    Collection Time: 01/06/16  2:18 PM  Result Value Ref Range Status   MRSA by PCR NEGATIVE NEGATIVE Final    Comment:        The GeneXpert MRSA Assay (FDA approved for NASAL specimens only), is one component of a comprehensive MRSA colonization surveillance program. It is not intended to diagnose MRSA infection nor to guide or monitor treatment for MRSA infections.      Studies: US Venous Img Lower Unilateral Left  Result Date: 01/06/2016 CLINICAL DATA:  Left lower extremity swelling and erythema since last night EXAM: LEFT LOWER EXTREMITY VENOUS DUPLEX ULTRASOUND TECHNIQUE: Doppler venous assessment of the left lower extremity deep venous system was performed, including characterization of spectral flow, compressibility, and phasicity. COMPARISON:  None. FINDINGS: There is complete compressibility of the left common femoral, femoral, and popliteal veins. Doppler analysis demonstrates respiratory phasicity and augmentation of flow with calf compression. No obvious superficial vein or calf vein thrombosis. IMPRESSION: No evidence of left lower extremity DVT. Electronically Signed   By: Marybelle Killings M.D.   On: 01/06/2016 15:02    Scheduled Meds: . aspirin  325 mg Oral Daily  . atorvastatin  20 mg Oral Daily  . buPROPion  150 mg Oral BID  .  ceFAZolin (ANCEF) IV  1 g Intravenous Q8H  . enoxaparin (LOVENOX) injection  40 mg Subcutaneous Q12H  . furosemide  40 mg Oral Daily  . loratadine  10 mg Oral Daily  . losartan  50 mg Oral Daily  . mometasone-formoterol  2 puff Inhalation BID  . nicotine  21 mg Transdermal Daily  . oxybutynin  10 mg Oral QHS  . pantoprazole  40 mg Oral BID  . umeclidinium bromide  1 puff Inhalation Daily    Assessment/Plan:  1. 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 I changed antibiotic to Ancef. Reevaluate tomorrow for potential disposition 2. Lower extremity edema. Obtain an echocardiogram start low-dose Lasix. 3. History of COPD.  Respiratory status stable 4. Essential hypertension continue usual medications 5. Morbid obesity weight loss needed  Code Status:     Code Status Orders        Start     Ordered   01/06/16 1526  Full code  Continuous     01/06/16 1525    Code Status History    Date Active Date Inactive Code Status Order ID Comments User Context   This patient has a current code status but no historical code status.      Disposition Plan: To be determined  Antibiotics:  Ancef  Time spent: 24 minutes  Loletha Grayer  [employer]

## 2016-01-09 LAB — ECHOCARDIOGRAM COMPLETE
HEIGHTINCHES: 69 in
WEIGHTICAEL: 5056 [oz_av]

## 2016-01-09 MED ORDER — FUROSEMIDE 40 MG PO TABS: 40.0000 mg | ORAL_TABLET | Freq: Every day | ORAL | 0 refills | Status: DC

## 2016-01-09 MED ORDER — NICOTINE 21 MG/24HR TD PT24: 21.0000 mg | MEDICATED_PATCH | Freq: Every day | TRANSDERMAL | 0 refills | Status: DC

## 2016-01-09 MED ORDER — CEPHALEXIN 500 MG PO CAPS: 500.0000 mg | ORAL_CAPSULE | Freq: Four times a day (QID) | ORAL | 0 refills | Status: DC

## 2016-01-09 MED ORDER — LOSARTAN POTASSIUM 50 MG PO TABS: 50.0000 mg | ORAL_TABLET | Freq: Every day | ORAL | 0 refills | Status: DC

## 2016-01-09 MED ORDER — OXYCODONE-ACETAMINOPHEN 5-325 MG PO TABS: 1.0000 | ORAL_TABLET | Freq: Four times a day (QID) | ORAL | 0 refills | Status: DC | PRN

## 2016-01-09 NOTE — Progress Notes (Signed)
Patient ID: [name], male   DOB: 03-May-1965, 50 y.o.   MRN: [id number] [place] at [place] was admitted to the [facility] on 01/06/2016 and Discharged  01/09/2016 and should be excused from work/school   for 10 days starting 01/06/2016 , may return to work/school without any restrictions.  Loletha Grayer M.D on 01/09/2016,at 9:20 AM  [place] at [place]

## 2016-01-09 NOTE — Discharge Instructions (Signed)
Keep wound coverd with nonstick dressing and cover with kerlex  Cellulitis, Adult Introduction Cellulitis is a skin infection. The infected area is usually red and sore. This condition occurs most often in the arms and lower legs. It is very important to get treated for this condition. Follow these instructions at home:  Take over-the-counter and prescription medicines only as told by your doctor.  If you were prescribed an antibiotic medicine, take it as told by your doctor. Do not stop taking the antibiotic even if you start to feel better.  Drink enough fluid to keep your pee (urine) clear or pale yellow.  Do not touch or rub the infected area.  Raise (elevate) the infected area above the level of your heart while you are sitting or lying down.  Place warm or cold wet cloths (warm or cold compresses) on the infected area. Do this as told by your doctor.  Keep all follow-up visits as told by your doctor. This is important. These visits let your doctor make sure your infection is not getting worse. Contact a doctor if:  You have a fever.  Your symptoms do not get better after 1-2 days of treatment.  Your bone or joint under the infected area starts to hurt after the skin has healed.  Your infection comes back. This can happen in the same area or another area.  You have a swollen bump in the infected area.  You have new symptoms.  You feel ill and also have muscle aches and pains. Get help right away if:  Your symptoms get worse.  You feel very sleepy.  You throw up (vomit) or have watery poop (diarrhea) for a long time.  There are red streaks coming from the infected area.  Your red area gets larger.  Your red area turns darker. This information is not intended to replace advice given to you by your health care provider. Make sure you discuss any questions you have with your health care provider. Document Released: 06/17/2007 Document Revised: 06/06/2015 Document  Reviewed: 11/07/2014  2017 Elsevier

## 2016-01-09 NOTE — Discharge Summary (Signed)
[place] at [place] NAME: [name]    MR#:  [id number]  DATE OF BIRTH:  1965-08-02  DATE OF ADMISSION:  01/06/2016 ADMITTING PHYSICIAN: Vaughan Basta, MD  DATE OF DISCHARGE: 01/09/2016  1:43 PM  PRIMARY CARE PHYSICIAN: Out of State   ADMISSION DIAGNOSIS:  Swelling [R60.9] Cellulitis of left lower extremity [id number]  DISCHARGE DIAGNOSIS:  Principal Problem:   Cellulitis   SECONDARY DIAGNOSIS:   Past Medical History:  Diagnosis Date  . COPD (chronic obstructive pulmonary disease) ([place])   . Coronary artery disease   . Hypertension   . Peptic ulcer   . Sleep apnea     HOSPITAL COURSE:   1. 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 Patient initially was placed on aggressive antibiotics and Unna boots were placed. I changed antibiotics to Ancef yesterday. Erythema has settled down. Area in the front of his shin is starting to get smaller. Area posterior calf still had areas of bleeding where they placed an Haematologist. Must keep leg covered until healed. Referral to wound care center. The patient's PCP is in [place] but he does live here in [place]. 2. Lower extremity edema start low-dose Lasix. Echo shows a normal EF. 3. History of COPD respiratory status stable continue his usual medications 4. Essential hypertension continue losartan and switch hydrochlorothiazide over to Lasix 5. Morbid obesity weight loss needed 6. Tobacco abuse nicotine patch prescribed  DISCHARGE CONDITIONS:   Satisfactory  CONSULTS OBTAINED:   none  DRUG ALLERGIES:   Allergies  Allergen Reactions  . Shellfish Allergy Swelling  . Codeine Rash    DISCHARGE MEDICATIONS:   Discharge Medication List as of 01/09/2016 10:52 AM    START taking these medications   Details  cephALEXin (KEFLEX) 500 MG capsule Take 1 capsule (500 mg total) by mouth 4 (four) times daily., Starting Thu 01/09/2016, Print    furosemide (LASIX)  40 MG tablet Take 1 tablet (40 mg total) by mouth daily., Starting Thu 01/09/2016, Print    losartan (COZAAR) 50 MG tablet Take 1 tablet (50 mg total) by mouth daily., Starting Thu 01/09/2016, Print    nicotine (NICODERM CQ - DOSED IN MG/24 HOURS) 21 mg/24hr patch Place 1 patch (21 mg total) onto the skin daily., Starting Thu 01/09/2016, Print    oxyCODONE-acetaminophen (PERCOCET/ROXICET) 5-325 MG tablet Take 1 tablet by mouth every 6 (six) hours as needed for moderate pain., Starting Thu 01/09/2016, Print      CONTINUE these medications which have NOT CHANGED   Details  aspirin 325 MG EC tablet Take 325 mg by mouth daily., Historical Med    aspirin-acetaminophen-caffeine (EXCEDRIN MIGRAINE) 250-250-65 MG tablet Take by mouth every 6 (six) hours as needed for headache., Historical Med    atorvastatin (LIPITOR) 20 MG tablet Take 20 mg by mouth daily., Historical Med    buPROPion (WELLBUTRIN SR) 150 MG 12 hr tablet Take 150 mg by mouth 2 (two) times daily., Historical Med    Fluticasone-Salmeterol (ADVAIR DISKUS) 250-50 MCG/DOSE AEPB Inhale 1 puff into the lungs 2 (two) times daily., Historical Med    loratadine (CLARITIN) 10 MG tablet Take 10 mg by mouth daily., Historical Med    omeprazole (PRILOSEC) 20 MG capsule Take 20 mg by mouth 2 (two) times daily., Historical Med    oxybutynin (DITROPAN-XL) 10 MG 24 hr tablet Take 10 mg by mouth at bedtime., Historical Med    umeclidinium bromide (INCRUSE ELLIPTA) 62.5 MCG/INH AEPB Inhale 1 puff into  the lungs daily., Historical Med      STOP taking these medications     losartan-hydrochlorothiazide (HYZAAR) 50-12.5 MG tablet      Potassium Gluconate 550 MG TABS          DISCHARGE INSTRUCTIONS:   Follow-up with PMD as soon as he get back to [place] Follow-up with the wound care center here  If you experience worsening of your admission symptoms, develop shortness of breath, life threatening emergency, suicidal or homicidal  thoughts you must seek medical attention immediately by calling 911 or calling your MD immediately  if symptoms less severe.  You Must read complete instructions/literature along with all the possible adverse reactions/side effects for all the Medicines you take and that have been prescribed to you. Take any new Medicines after you have completely understood and accept all the possible adverse reactions/side effects.   Please note  You were cared for by a hospitalist during your hospital stay. If you have any questions about your discharge medications or the care you received while you were in the hospital after you are discharged, you can call the unit and asked to speak with the hospitalist on call if the hospitalist that took care of you is not available. Once you are discharged, your primary care physician will handle any further medical issues. Please note that NO REFILLS for any discharge medications will be authorized once you are discharged, as it is imperative that you return to your primary care physician (or establish a relationship with a primary care physician if you do not have one) for your aftercare needs so that they can reassess your need for medications and monitor your lab values.    Today   CHIEF COMPLAINT:   Chief Complaint  Patient presents with  . Leg Swelling    HISTORY OF PRESENT ILLNESS:  [name]  is a 50 y.o. male presented with leg swelling and some drainage   VITAL SIGNS:  Blood pressure (!) 143/81, pulse 90, temperature 97.8 F (36.6 C), temperature source Oral, resp. rate 16, height 5\' 9"  (1.753 m), weight (!) 143.3 kg (316 lb), SpO2 97 %.    PHYSICAL EXAMINATION:  GENERAL:  50 y.o.-year-old patient lying in the bed with no acute distress.  EYES: Pupils equal, round, reactive to light and accommodation. No scleral icterus. Extraocular muscles intact.  HEENT: Head atraumatic, normocephalic. Oropharynx and nasopharynx clear.  NECK:  Supple, no  jugular venous distention. No thyroid enlargement, no tenderness.  LUNGS: Normal breath sounds bilaterally, no wheezing, rales,rhonchi or crepitation. No use of accessory muscles of respiration.  CARDIOVASCULAR: S1, S2 normal. No murmurs, rubs, or gallops.  ABDOMEN: Soft, non-tender, non-distended. Bowel sounds present. No organomegaly or mass.  EXTREMITIES: 2+ pedal edema, cyanosis, or clubbing.  NEUROLOGIC: Cranial nerves II through XII are intact. Muscle strength 5/5 in all extremities. Sensation intact. Gait not checked.  PSYCHIATRIC: The patient is alert and oriented x 3.  SKIN: Bilateral chronic lower extremity shin erythema. Left lower extremity does have a large area of scab that starting to get smaller. On the posterior calf does have areas where bleeding where the Unna boot was taken off. The erythema has improved on the calf and leg.  DATA REVIEW:   CBC  Recent Labs Lab 01/07/16 0500  WBC 9.2  HGB 14.8  HCT 43.7  PLT 209    Chemistries   Recent Labs Lab 01/07/16 0500  NA 138  K 4.8  CL 103  CO2 29  GLUCOSE  113*  BUN 14  CREATININE 1.15  CALCIUM 9.0   Microbiology Results  Results for orders placed or performed during the hospital encounter of 01/06/16  Blood culture (routine x 2)     Status: None (Preliminary result)   Collection Time: 01/06/16 12:06 PM  Result Value Ref Range Status   Specimen Description BLOOD LEFT ANTECUBITAL  Final   Special Requests   Final    BOTTLES DRAWN AEROBIC AND ANAEROBIC  AER 10CC ANA 8CC   Culture NO GROWTH 3 DAYS  Final   Report Status PENDING  Incomplete  Blood culture (routine x 2)     Status: None (Preliminary result)   Collection Time: 01/06/16 12:25 PM  Result Value Ref Range Status   Specimen Description BLOOD RIGHT HAND  Final   Special Requests   Final    BOTTLES DRAWN AEROBIC AND ANAEROBIC  AER [place] ANA 11CC   Culture NO GROWTH 3 DAYS  Final   Report Status PENDING  Incomplete  MRSA PCR Screening     Status: None    Collection Time: 01/06/16  2:18 PM  Result Value Ref Range Status   MRSA by PCR NEGATIVE NEGATIVE Final    Comment:        The GeneXpert MRSA Assay (FDA approved for NASAL specimens only), is one component of a comprehensive MRSA colonization surveillance program. It is not intended to diagnose MRSA infection nor to guide or monitor treatment for MRSA infections.     Management plans discussed with the patient, And he is in agreement.  CODE STATUS:  Code Status History    Date Active Date Inactive Code Status Order ID Comments User Context   01/06/2016  3:25 PM 01/09/2016  4:48 PM Full Code [id number]  Vaughan Basta, MD Inpatient      TOTAL TIME TAKING CARE OF THIS PATIENT: 35 minutes.    Loletha Grayer M.D on 01/09/2016 at 5:49 PM  Between 7am to 6pm - Pager - [phone]  After 6pm go to [email or web address] - password [email or web address] [facility]  [facility] Office  [phone]  CC: Primary care physician; out of state

## 2016-01-09 NOTE — Progress Notes (Signed)
Patient A&O,VSS.  Medicated x2 for pain with good relief.  Dressing changed prior to discharge and teaching performed regarding same to patient and sister who will be assisting.  Discharge instructions reviewed with patient and sister.  Understanding was verbalized and all questions were answered.  Patient discharged home via wheelchair in stable condition escorted by nursing staff.

## 2016-01-11 LAB — CULTURE, BLOOD (ROUTINE X 2)
CULTURE: NO GROWTH
Culture: NO GROWTH

## 2016-01-16 ENCOUNTER — Encounter: Payer: BLUE CROSS/BLUE SHIELD | Attending: Surgery | Admitting: Surgery

## 2016-01-16 DIAGNOSIS — F329 Major depressive disorder, single episode, unspecified: Secondary | ICD-10-CM | POA: Diagnosis not present

## 2016-01-16 DIAGNOSIS — G4730 Sleep apnea, unspecified: Secondary | ICD-10-CM | POA: Insufficient documentation

## 2016-01-16 DIAGNOSIS — F17218 Nicotine dependence, cigarettes, with other nicotine-induced disorders: Secondary | ICD-10-CM | POA: Diagnosis not present

## 2016-01-16 DIAGNOSIS — I2510 Atherosclerotic heart disease of native coronary artery without angina pectoris: Secondary | ICD-10-CM | POA: Diagnosis not present

## 2016-01-16 DIAGNOSIS — I890 Lymphedema, not elsewhere classified: Secondary | ICD-10-CM | POA: Diagnosis not present

## 2016-01-16 DIAGNOSIS — I110 Hypertensive heart disease with heart failure: Secondary | ICD-10-CM | POA: Diagnosis not present

## 2016-01-16 DIAGNOSIS — G40909 Epilepsy, unspecified, not intractable, without status epilepticus: Secondary | ICD-10-CM | POA: Diagnosis not present

## 2016-01-16 DIAGNOSIS — J449 Chronic obstructive pulmonary disease, unspecified: Secondary | ICD-10-CM | POA: Insufficient documentation

## 2016-01-16 DIAGNOSIS — Z6841 Body Mass Index (BMI) 40.0 and over, adult: Secondary | ICD-10-CM | POA: Insufficient documentation

## 2016-01-16 DIAGNOSIS — Z885 Allergy status to narcotic agent status: Secondary | ICD-10-CM | POA: Diagnosis not present

## 2016-01-16 DIAGNOSIS — Z7982 Long term (current) use of aspirin: Secondary | ICD-10-CM | POA: Insufficient documentation

## 2016-01-16 DIAGNOSIS — I509 Heart failure, unspecified: Secondary | ICD-10-CM | POA: Insufficient documentation

## 2016-01-16 DIAGNOSIS — L97222 Non-pressure chronic ulcer of left calf with fat layer exposed: Secondary | ICD-10-CM | POA: Insufficient documentation

## 2016-01-17 NOTE — Progress Notes (Signed)
STEBAN, MADDEN ([id number]) Visit Report for 01/16/2016 Chief Complaint Document Details Patient Name: [name], [name] Date of Service: 01/16/2016 9:00 AM Medical Record Number: [id number] Patient Account Number: [id number] Date of Birth/Sex: Jan 01, 1966 (51 y.o. Male) Treating RN: Carolyne Fiscal, Debi Primary Care Physician: PATIENT, NO Other Clinician: Referring Physician: Loletha Grayer Treating Physician/Extender: Frann Rider in Treatment: 0 Information Obtained from: Patient Chief Complaint Patient presents to the wound care center for a consult due non healing wound for bilateral lower extremity edema which she's had for several months Electronic Signature(s) Signed: 01/16/2016 10:16:41 AM By: Christin Fudge MD, FACS Entered By: Christin Fudge on 01/16/2016 10:16:41 [name], [name] ([id number]) -------------------------------------------------------------------------------- HPI Details Patient Name: [name], [name] Date of Service: 01/16/2016 9:00 AM Medical Record Number: [id number] Patient Account Number: [id number] Date of Birth/Sex: December 21, 1965 (52 y.o. Male) Treating RN: Carolyne Fiscal, Debi Primary Care Physician: PATIENT, NO Other Clinician: Referring Physician: Loletha Grayer Treating Physician/Extender: Frann Rider in Treatment: 0 History of Present Illness Location: a lateral lower extremity edema with swelling and infection of the left lower extremity Quality: Patient reports experiencing a sharp pain to affected area(s). Severity: Patient states wound are getting worse. Duration: Patient has had the wound for > 3 months prior to seeking treatment at the wound center Timing: Pain in wound is constant (hurts all the time) Context: The wound appeared gradually over time Modifying Factors: Consults to this date include:was admitted to the hospital for IV antibiotics and treatment Associated Signs and Symptoms: Patient reports having increase  discharge. HPI Description: 51 year old gentleman who is a current every day smoker and smokes a pack of cigarettes a day for the last 30 years also is known to have had a recent admission to the hospital between December 25 and 01/09/2016 and was admitted for swelling and cellulitis of the left lower extremity. he has a past medical history of COPD, coronary artery disease, hypertension, peptic ulcer and sleep apnea. His admission he was known to have cellulitis the left lower extremity and bilateral lower extremity lymphedema, and was placed on aggressive antibiotics( vancomycin and Zosyn) and Unna boot and change to Ancef later. He was also treated for COPD, essential hypertension and nicotine abuse. An echo done and showed a normal ejection fraction and was started on low-dose Lasix. he was discharged home on Keflex. during the admission of a duplex study was done for possible DVT and there was no evidence of this found in the left lower extremity. 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 In the process of finding a PCP here Electronic Signature(s) Signed: 01/16/2016 10:17:50 AM By: Christin Fudge MD, FACS Previous Signature: 01/16/2016 9:44:31 AM Version By: Christin Fudge MD, FACS Previous Signature: 01/16/2016 9:44:21 AM Version By: Christin Fudge MD, FACS Entered By: Christin Fudge on 01/16/2016 10:17:49 [name] ([id number]) -------------------------------------------------------------------------------- Physical Exam Details Patient Name: [name], [name] Date of Service: 01/16/2016 9:00 AM Medical Record Number: [id number] Patient Account Number: [id number] Date of Birth/Sex: [birth year] (51 y.o. Male) Treating RN: Carolyne Fiscal, Debi Primary Care Physician: PATIENT, NO Other Clinician: Referring Physician: Loletha Grayer Treating Physician/Extender: Frann Rider in Treatment: 0 Constitutional . Pulse regular.  Respirations normal and unlabored. Afebrile. . Eyes Nonicteric. Reactive to light. Ears, Nose, Mouth, and Throat Lips, teeth, and gums WNL.Marland Kitchen Moist mucosa without lesions. Neck supple and nontender. No palpable supraclavicular or cervical adenopathy. Normal sized without goiter. Respiratory WNL. No retractions.. Cardiovascular Pedal Pulses  WNL. ABI on the left is 0.98 and on the right is 0.74. bilateral lymphedema left worse than right. Chest Breasts symmetical and no nipple discharge.. Breast tissue WNL, no masses, lumps, or tenderness.. Gastrointestinal (GI) Abdomen without masses or tenderness.. No liver or spleen enlargement or tenderness.. Lymphatic No adneopathy. No adenopathy. No adenopathy. Musculoskeletal Adexa without tenderness or enlargement.. Digits and nails w/o clubbing, cyanosis, infection, petechiae, ischemia, or inflammatory conditions.. Integumentary (Hair, Skin) No suspicious lesions. No crepitus or fluctuance. No peri-wound warmth or erythema. No masses.Marland Kitchen Psychiatric Judgement and insight Intact.. No evidence of depression, anxiety, or agitation.. Notes his right leg has stage II lymphedema but no open ulcerations. The left lower extremity has ulceration both anteriorly and posteriorly and they're weeping quite significantly. No sharp debridement was required today. Electronic Signature(s) Signed: 01/16/2016 10:18:41 AM By: Christin Fudge MD, FACS Entered By: Christin Fudge on 01/16/2016 10:18:41 [name] ([id number]) [name] ([id number]) -------------------------------------------------------------------------------- Physician Orders Details Patient Name: [name], [name] Date of Service: 01/16/2016 9:00 AM Medical Record Number: [id number] Patient Account Number: [id number] Date of Birth/Sex: [birth year] (51 y.o. Male) Treating RN: Carolyne Fiscal, Debi Primary Care Physician: PATIENT, NO Other Clinician: Referring Physician: Loletha Grayer Treating Physician/Extender: Frann Rider in Treatment: 0 Verbal / Phone Orders: Yes Clinician: Pinkerton, Debi Read Back and Verified: Yes Diagnosis Coding Wound Cleansing Wound #1 Left,Anterior Lower Leg o Clean wound with Normal Saline. o Cleanse wound with mild soap and water - when changing dressing Wound #2 Left,Posterior Lower Leg o Clean wound with Normal Saline. o Cleanse wound with mild soap and water - when changing dressing Anesthetic Wound #1 Left,Anterior Lower Leg o Topical Lidocaine 4% cream applied to wound bed prior to debridement - clinic use Wound #2 Left,Posterior Lower Leg o Topical Lidocaine 4% cream applied to wound bed prior to debridement - clinic use Primary Wound Dressing Wound #1 Left,Anterior Lower Leg o Aquacel Ag - or equivalent to Wound #2 Left,Posterior Lower Leg o Aquacel Ag - or equivalent to Secondary Dressing Wound #1 Left,Anterior Lower Leg o ABD pad o XtraSorb Wound #2 Left,Posterior Lower Leg o ABD pad o XtraSorb Dressing Change Frequency Wound #1 Left,Anterior Lower Leg o Three times weekly - HHRN to change Mondays and Wednesdays and pt will come to wound care clinic on Fridays. [name] ([id number]) Wound #2 Left,Posterior Lower Leg o Three times weekly - HHRN to change Mondays and Wednesdays and pt will come to wound care clinic on Fridays. Follow-up Appointments Wound #1 Left,Anterior Lower Leg o Return Appointment in 1 week. Wound #2 Left,Posterior Lower Leg o Return Appointment in 1 week. Edema Control Wound #1 Left,Anterior Lower Leg o 3 Layer Compression System - Left Lower Extremity - unna to achhor o Elevate legs to the level of the heart and pump ankles as often as possible Wound #2 Left,Posterior Lower Leg o 3 Layer Compression System - Left Lower Extremity - unna to achhor o Elevate legs to the level of the heart and pump ankles as often as  possible Additional Orders / Instructions Wound #1 Left,Anterior Lower Leg o Stop Smoking o Increase protein intake. o Other: - work note for missing work Wound #2 Left,Posterior Lower Leg o Stop Smoking o Increase protein intake. o Other: - work note for missing work [place] [facility] [place] for [place] may visit PRN to address patientos wound care needs. o FACE TO FACE ENCOUNTER: MEDICARE and  MEDICAID PATIENTS: I certify that this patient is under my care and that I had a face-to-face encounter that meets the physician face-to-face encounter requirements with this patient on this date. The encounter with the patient was in whole or in part for the following MEDICAL CONDITION: (primary reason for [place]) MEDICAL NECESSITY: I certify, that based on my findings, NURSING services are a medically necessary home health service. HOME BOUND STATUS: I certify that my clinical findings support that this patient is homebound (i.e., Due to illness or injury, pt requires aid of supportive devices such as crutches, cane, wheelchairs, walkers, the use of special transportation or the assistance of another person to leave their place of residence. There is a normal inability to leave the home and doing so requires considerable and taxing effort. Other absences are for medical reasons / religious services and are infrequent or of short duration when for other reasons). NICKALAUS, SHERFIELD ([id number]) o If current dressing causes regression in wound condition, may D/C ordered dressing product/s and apply Normal Saline Moist Dressing daily until next [place] / Other MD appointment. [place] of regression in wound condition at [phone]. o Please direct any NON-WOUND related issues/requests for orders to patient's Primary Care Physician Wound #2 [place] for [place] Nurse may visit PRN to address patientos wound care needs. o FACE TO FACE ENCOUNTER: MEDICARE and MEDICAID PATIENTS: I certify that this patient is under my care and that I had a face-to-face encounter that meets the physician face-to-face encounter requirements with this patient on this date. The encounter with the patient was in whole or in part for the following MEDICAL CONDITION: (primary reason for [place]) MEDICAL NECESSITY: I certify, that based on my findings, NURSING services are a medically necessary home health service. HOME BOUND STATUS: I certify that my clinical findings support that this patient is homebound (i.e., Due to illness or injury, pt requires aid of supportive devices such as crutches, cane, wheelchairs, walkers, the use of special transportation or the assistance of another person to leave their place of residence. There is a normal inability to leave the home and doing so requires considerable and taxing effort. Other absences are for medical reasons / religious services and are infrequent or of short duration when for other reasons). o If current dressing causes regression in wound condition, may D/C ordered dressing product/s and apply Normal Saline Moist Dressing daily until next [place] / Other MD appointment. [place] of regression in wound condition at [phone]. o Please direct any NON-WOUND related issues/requests for orders to patient's Primary Care Physician Medications-please add to medication list. Wound #1 Left,Anterior Lower Leg o Other: - Vitamin C, Zinc, Multivitamin Wound #2 Left,Posterior Lower Leg o Other: - Vitamin C, Zinc, Multivitamin Services and Therapies o Arterial Studies- Bilateral - AVVS o Venous Studies -Bilateral - AVVS Electronic Signature(s) Signed: 01/16/2016 3:27:10 PM By: Christin Fudge MD, FACS Signed:  01/16/2016 3:49:33 PM By: Alric Quan Entered By: Alric Quan on 01/16/2016 11:14:50 Kettles, [name] ([id number]) -------------------------------------------------------------------------------- Problem List Details Patient Name: Brizuela, Jerimyah P. Date of Service: 01/16/2016 9:00 AM Medical Record Number: [id number] Patient Account Number: [id number] Date of Birth/Sex: 1965/09/05 (51 y.o. 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 ([id number]) -------------------------------------------------------------------------------- Progress Note Details Patient Name: Brookens, Colburn P. Date of Service: 01/16/2016 9:00 AM Medical Record Number: [id number] Patient Account Number: [id number] Date of Birth/Sex: 01-26-1965 (51 y.o. Male) Treating RN: Carolyne Fiscal, Debi Primary Care Physician: PATIENT, NO Other Clinician: Referring Physician: Loletha Grayer Treating Physician/Extender: Frann Rider in Treatment: 0 Subjective Chief Complaint Information obtained from Patient Patient presents to the wound care center for a consult due non healing wound for bilateral lower extremity edema which she's had for several months History of Present Illness (HPI) The following HPI elements were  documented for the patient's wound: Location: a lateral lower extremity edema with swelling and infection of the left lower extremity Quality: Patient reports experiencing a sharp pain to affected area(s). Severity: Patient states wound are getting worse. Duration: Patient has had the wound for > 3 months prior to seeking treatment at the wound center Timing: Pain in wound is constant (hurts all the time) Context: The wound appeared gradually over time Modifying Factors: Consults to this date include:was admitted to the hospital for IV antibiotics and treatment Associated Signs and Symptoms: Patient reports having increase discharge. 51 year old gentleman who is a current every day smoker and smokes a pack of cigarettes a day for the last 30 years also is known to have had a recent admission to the hospital between December 25 and 01/09/2016 and was admitted for swelling and cellulitis of the left lower extremity. he has a past medical history of COPD, coronary artery disease, hypertension, peptic ulcer and sleep apnea. His admission he was known to have cellulitis the left lower extremity and bilateral lower extremity lymphedema, and was placed on aggressive antibiotics( vancomycin and Zosyn) and Unna boot and change to Ancef later. He was also treated for COPD, essential hypertension and nicotine abuse. An echo done and showed a normal ejection fraction and was started on low-dose Lasix. he was discharged home on Keflex. during the admission of a duplex study was done for possible DVT and there was no evidence of this found in the left lower extremity. 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 In the process of finding a PCP here Wound History Patient presents with 2 open wounds that have been present for approximately 3 weeks. Patient has been treating wounds in the following manner: neosporin. Laboratory tests have not been  performed in the last month. Patient reportedly has not tested positive for an antibiotic resistant organism. Patient reportedly has not tested positive for osteomyelitis. Patient reportedly has not had testing performed to evaluate circulation in the legs. Patient experiences the following problems associated with their wounds: infection, swelling. [name] ([id number]) Patient History Information obtained from Patient. Allergies codeine, lisinopril, sea food Family History Cancer - Father, Diabetes - Mother, Father, Hypertension - Mother, Kidney Disease - Maternal Grandparents, Lung Disease - Maternal Grandparents, Mother, No family history of Heart Disease, Hereditary Spherocytosis, Seizures, Stroke, Thyroid Problems, Tuberculosis. Social History Current some day smoker - less than a pack a day, Marital Status - Single, Alcohol Use - Never, Drug Use - No  History, Caffeine Use - Daily. Medical History Respiratory Patient has history of Chronic Obstructive Pulmonary Disease (COPD), Sleep Apnea Cardiovascular Patient has history of Congestive Heart Failure, Coronary Artery Disease Neurologic Patient has history of Seizure Disorder - pt states that he has seizures Review of Systems (ROS) Constitutional Symptoms (General Health) The patient has no complaints or symptoms. Eyes The patient has no complaints or symptoms. Ear/Nose/Mouth/Throat The patient has no complaints or symptoms. Hematologic/Lymphatic The patient has no complaints or symptoms. Gastrointestinal peptic ulcer Endocrine The patient has no complaints or symptoms. Genitourinary The patient has no complaints or symptoms. Immunological The patient has no complaints or symptoms. Integumentary (Skin) Complains or has symptoms of Wounds. Musculoskeletal The patient has no complaints or symptoms. Oncologic The patient has no complaints or symptoms. Psychiatric depression [name]  ([id number]) Medications aspirin 325 mg tablet oral tablet oral Excedrin Migraine 250 mg-250 mg-65 mg tablet oral tablet oral Advair Diskus 250 mcg-50 mcg/dose powder for inhalation inhalation blister with device inhalation Objective Constitutional Pulse regular. Respirations normal and unlabored. Afebrile. Vitals Time Taken: 9:17 AM, Height: 69 in, Source: Stated, Weight: 316 lbs, Source: Measured, BMI: 46.7, Temperature: 97.9 F, Pulse: 102 bpm, Respiratory Rate: 18 breaths/min, Blood Pressure: 139/84 mmHg. Eyes Nonicteric. Reactive to light. Ears, Nose, Mouth, and Throat Lips, teeth, and gums WNL.Marland Kitchen Moist mucosa without lesions. Neck supple and nontender. No palpable supraclavicular or cervical adenopathy. Normal sized without goiter. Respiratory WNL. No retractions.. Cardiovascular Pedal Pulses WNL. ABI on the left is 0.98 and on the right is 0.74. bilateral lymphedema left worse than right. Chest Breasts symmetical and no nipple discharge.. Breast tissue WNL, no masses, lumps, or tenderness.. Gastrointestinal (GI) Abdomen without masses or tenderness.. No liver or spleen enlargement or tenderness.. Lymphatic No adneopathy. No adenopathy. No adenopathy. Musculoskeletal Adexa without tenderness or enlargement.. Digits and nails w/o clubbing, cyanosis, infection, petechiae, [place], Tara P. ([id number]) ischemia, or inflammatory conditions.Marland Kitchen Psychiatric Judgement and insight Intact.. No evidence of depression, anxiety, or agitation.. General Notes: his right leg has stage II lymphedema but no open ulcerations. The left lower extremity has ulceration both anteriorly and posteriorly and they're weeping quite significantly. No sharp debridement was required today. Integumentary (Hair, Skin) No suspicious lesions. No crepitus or fluctuance. No peri-wound warmth or erythema. No masses.. Wound #1 status is Open. Original cause of wound was Gradually Appeared. The wound is located on  the Left,Anterior Lower Leg. The wound measures 6cm length x 5.5cm width x 0.1cm depth; 25.918cm^2 area and 2.592cm^3 volume. The wound is limited to skin breakdown. There is no tunneling or undermining noted. There is a large amount of serous drainage noted. The wound margin is distinct with the outline attached to the wound base. There is no granulation within the wound bed. There is a large (67-100%) amount of necrotic tissue within the wound bed including Adherent Slough. The periwound skin appearance exhibited: Localized Edema, Maceration, Moist, Erythema. The surrounding wound skin color is noted with erythema which is circumferential. Periwound temperature was noted as No Abnormality. The periwound has tenderness on palpation. Wound #2 status is Open. Original cause of wound was Gradually Appeared. The wound is located on the Left,Posterior Lower Leg. The wound measures 10.7cm length x 10.5cm width x 0.1cm depth; 88.239cm^2 area and 8.824cm^3 volume. The wound is limited to skin breakdown. There is no tunneling or undermining noted. There is a large amount of serous drainage noted. The wound margin is distinct with the outline attached to the wound base. There is  no granulation within the wound bed. There is a large (67-100%) amount of necrotic tissue within the wound bed including Adherent Slough. The periwound skin appearance exhibited: Localized Edema, Maceration, Moist, Erythema. The surrounding wound skin color is noted with erythema which is circumferential. Periwound temperature was noted as No Abnormality. The periwound has tenderness on palpation. Assessment Active Problems ICD-10 I89.0 - Lymphedema, not elsewhere classified L97.222 - Non-pressure chronic ulcer of left calf with fat layer exposed E66.01 - Morbid (severe) obesity due to excess calories F17.218 - Nicotine dependence, cigarettes, with other nicotine-induced disorders [name] ([id number]) 51 year old  morbidly obese gentleman with significant lymphedema recently diagnosed with cellulitis and treated appropriately in hospital. After review I have recommended: 1. Silver alginate, DrawTex and a 3 layer Profore wrap to the left lower extremity. 2. arterial duplex and venous duplex studies for reflux both lower extremities 3. If he is going to be following up here he needs to find a PCP to help control his medical issues 4. I have spent over 3 minutes discussing with him the need to completely give up smoking have discussed the risks benefits and alternatives and all the possible complications 5. Regular visits to the wound center Plan Wound Cleansing: Wound #1 Left,Anterior Lower Leg: Clean wound with Normal Saline. Cleanse wound with mild soap and water - when changing dressing Wound #2 Left,Posterior Lower Leg: Clean wound with Normal Saline. Cleanse wound with mild soap and water - when changing dressing Anesthetic: Wound #1 Left,Anterior Lower Leg: Topical Lidocaine 4% cream applied to wound bed prior to debridement - clinic use Wound #2 Left,Posterior Lower Leg: Topical Lidocaine 4% cream applied to wound bed prior to debridement - clinic use Primary Wound Dressing: Wound #1 Left,Anterior Lower Leg: Aquacel Ag - or equivalent to Wound #2 Left,Posterior Lower Leg: Aquacel Ag - or equivalent to Secondary Dressing: Wound #1 Left,Anterior Lower Leg: ABD pad XtraSorb Wound #2 Left,Posterior Lower Leg: ABD pad XtraSorb Dressing Change Frequency: Wound #1 Left,Anterior Lower Leg: Three times weekly - HHRN to change Mondays and Wednesdays and pt will come to wound care clinic on Fridays. Wound #2 Left,Posterior Lower Leg: Three times weekly - HHRN to change Mondays and Wednesdays and pt will come to wound care clinic on Fridays. Follow-up Appointments: Wound #1 Left,Anterior Lower Leg: Return Appointment in 1 week. Wound #2 Left,Posterior Lower Leg: Return Appointment in 1  week. Edema Control: Wound #1 Left,Anterior Lower Leg: [name]. ([id number]) 3 Layer Compression System - Left Lower Extremity - unna to achhor Elevate legs to the level of the heart and pump ankles as often as possible Wound #2 Left,Posterior Lower Leg: 3 Layer Compression System - Left Lower Extremity - unna to achhor Elevate legs to the level of the heart and pump ankles as often as possible Additional Orders / Instructions: Wound #1 Left,Anterior Lower Leg: Stop Smoking Increase protein intake. Other: - work note for missing work Wound #2 Left,Posterior Lower Leg: Stop Smoking Increase protein intake. Other: - work note for missing work Home Health: Wound #1 Left,Anterior Lower Leg: [place] for [place] Nurse may visit PRN to address patient s wound care needs. FACE TO FACE ENCOUNTER: MEDICARE and MEDICAID PATIENTS: I certify that this patient is under my care and that I had a face-to-face encounter that meets the physician face-to-face encounter requirements with this patient on this date. The encounter with the patient was in whole or in part for the following MEDICAL CONDITION: (primary reason  for Home Healthcare) MEDICAL NECESSITY: I certify, that based on my findings, NURSING services are a medically necessary home health service. HOME BOUND STATUS: I certify that my clinical findings support that this patient is homebound (i.e., Due to illness or injury, pt requires aid of supportive devices such as crutches, cane, wheelchairs, walkers, the use of special transportation or the assistance of another person to leave their place of residence. There is a normal inability to leave the home and doing so requires considerable and taxing effort. Other absences are for medical reasons / religious services and are infrequent or of short duration when for other reasons). If current dressing causes regression in wound condition, may D/C ordered  dressing product/s and apply Normal Saline Moist Dressing daily until next [place] / Other MD appointment. [place] of regression in wound condition at [phone]. Please direct any NON-WOUND related issues/requests for orders to patient's Primary Care Physician Wound #2 Left,Posterior Lower Leg: [place] for [place] Nurse may visit PRN to address patient s wound care needs. FACE TO FACE ENCOUNTER: MEDICARE and MEDICAID PATIENTS: I certify that this patient is under my care and that I had a face-to-face encounter that meets the physician face-to-face encounter requirements with this patient on this date. The encounter with the patient was in whole or in part for the following MEDICAL CONDITION: (primary reason for [place]) MEDICAL NECESSITY: I certify, that based on my findings, NURSING services are a medically necessary home health service. HOME BOUND STATUS: I certify that my clinical findings support that this patient is homebound (i.e., Due to illness or injury, pt requires aid of supportive devices such as crutches, cane, wheelchairs, walkers, the use of special transportation or the assistance of another person to leave their place of residence. There is a normal inability to leave the home and doing so requires considerable and taxing effort. Other absences are for medical reasons / religious services and are infrequent or of short duration when for other reasons). If current dressing causes regression in wound condition, may D/C ordered dressing product/s and apply Normal Saline Moist Dressing daily until next [place] / Other MD appointment. [place] of regression in wound condition at [phone]. Please direct any NON-WOUND related issues/requests for orders to patient's Primary Care Physician Medications-please add to medication list.: Wound #1 Left,Anterior Lower Leg: ONESIMUS, FOUSEK ([id number]) Other: - Vitamin C, Zinc, Multivitamin Wound #2 Left,Posterior Lower Leg: Other: - Vitamin C, Zinc, Multivitamin Services and Therapies ordered were: Arterial Studies- Bilateral - [facility], Venous Studies -Bilateral - [facility] 51 year old morbidly obese gentleman with significant lymphedema recently diagnosed with cellulitis and treated appropriately in hospital. After review I have recommended: 1. Silver alginate, DrawTex and a 3 layer Profore wrap to the left lower extremity. 2. arterial duplex and venous duplex studies for reflux both lower extremities 3. If he is going to be following up here he needs to find a PCP to help control his medical issues 4. I have spent over 3 minutes discussing with him the need to completely give up smoking have discussed the risks benefits and alternatives and all the possible complications 5. Regular visits to the wound center Electronic Signature(s) Signed: 01/16/2016 3:30:15 PM By: Christin Fudge MD, FACS Previous Signature: 01/16/2016 10:20:55 AM Version By: Christin Fudge MD, FACS Entered By: Christin Fudge on 01/16/2016 15:30:15 Cotterman, Malaky Mamie Bell ([id number]) -------------------------------------------------------------------------------- ROS/PFSH Details Patient Name: Cherne, Gerritt P. Date of Service: 01/16/2016  9:00 AM Medical Record Number: [id number] Patient Account Number: [id number] Date of Birth/Sex: [birth year] (51 y.o. Male) Treating RN: Carolyne Fiscal, Debi Primary Care Physician: PATIENT, NO Other Clinician: Referring Physician: Loletha Grayer Treating Physician/Extender: Frann Rider in Treatment: 0 Information Obtained From Patient Wound History Do you currently have one or more open woundso Yes How many open wounds do you currently haveo 2 Approximately how long have you had your woundso 3 weeks How have you been treating your wound(s) until nowo neosporin Has your wound(s) ever healed and then re-openedo No Have  you had any lab work done in the past montho No Have you tested positive for an antibiotic resistant organism (MRSA, VRE)o No Have you tested positive for osteomyelitis (bone infection)o No Have you had any tests for circulation on your legso No Have you had other problems associated with your woundso Infection, Swelling Integumentary (Skin) Complaints and Symptoms: Positive for: Wounds Constitutional Symptoms (General Health) Complaints and Symptoms: No Complaints or Symptoms Eyes Complaints and Symptoms: No Complaints or Symptoms Ear/Nose/Mouth/Throat Complaints and Symptoms: No Complaints or Symptoms Hematologic/Lymphatic Complaints and Symptoms: No Complaints or Symptoms Respiratory [place], Dimitris P. ([id number]) Medical History: Positive for: Chronic Obstructive Pulmonary Disease (COPD); Sleep Apnea Cardiovascular Medical History: Positive for: Congestive Heart Failure; Coronary Artery Disease Gastrointestinal Complaints and Symptoms: Review of System Notes: peptic ulcer Endocrine Complaints and Symptoms: No Complaints or Symptoms Genitourinary Complaints and Symptoms: No Complaints or Symptoms Immunological Complaints and Symptoms: No Complaints or Symptoms Musculoskeletal Complaints and Symptoms: No Complaints or Symptoms Neurologic Medical History: Positive for: Seizure Disorder - pt states that he has seizures Oncologic Complaints and Symptoms: No Complaints or Symptoms Psychiatric Complaints and Symptoms: Review of System Notes: depression Immunizations Pneumococcal Vaccine: BROEDY, MOGG ([id number]) Received Pneumococcal Vaccination: No Family and Social History Cancer: Yes - Father; Diabetes: Yes - Mother, Father; Heart Disease: No; Hereditary Spherocytosis: No; Hypertension: Yes - Mother; Kidney Disease: Yes - Maternal Grandparents; Lung Disease: Yes - Maternal Grandparents, Mother; Seizures: No; Stroke: No; Thyroid Problems: No;  Tuberculosis: No; Current some day smoker - less than a pack a day; Marital Status - Single; Alcohol Use: Never; Drug Use: No History; Caffeine Use: Daily; Financial Concerns: No; Food, Clothing or Shelter Needs: No; Support System Lacking: No; Transportation Concerns: No; Advanced Directives: No; Patient does not want information on Advanced Directives; Do not resuscitate: No; Living Will: No; Medical Power of Attorney: No Physician Affirmation I have reviewed and agree with the above information. Electronic Signature(s) Signed: 01/16/2016 3:27:10 PM By: Christin Fudge MD, FACS Signed: 01/16/2016 3:49:33 PM By: Alric Quan Entered By: Christin Fudge on 01/16/2016 10:15:06 Vlachos, Bufford P. ([id number]) -------------------------------------------------------------------------------- SuperBill Details Patient Name: Ucci, Lynk P. Date of Service: 01/16/2016 Medical Record Number: [id number] Patient Account Number: [id number] Date of Birth/Sex: [birth year] (51 y.o. Male) Treating RN: Carolyne Fiscal, Debi Primary Care Physician: PATIENT, NO Other Clinician: Referring Physician: Loletha Grayer Treating Physician/Extender: Frann Rider in Treatment: 0 Diagnosis Coding ICD-10 Codes Code Description I89.0 Lymphedema, not elsewhere classified L97.222 Non-pressure chronic ulcer of left calf with fat layer exposed E66.01 Morbid (severe) obesity due to excess calories F17.218 Nicotine dependence, cigarettes, with other nicotine-induced disorders Facility Procedures CPT4: Description Modifier Quantity Code [id number] - WOUND CARE VISIT-LEV 5 EST PT 1 CPT4: [id number]-[id number] CESSATION 3-10MINS 1 ICD-10 Description Diagnosis I89.0 Lymphedema, not elsewhere classified L97.222 Non-pressure chronic ulcer of left calf with fat layer exposed F17.218 Nicotine dependence, cigarettes, with other  nicotine-induced disorders E66.01 Morbid (severe) obesity due to excess  calories CPT4: [id number]  (Facility Use Only) [phone] - [place] LWR LT 1 LEG Physician Procedures CPT4 Code Description: [id number] - WC PHYS LEVEL 4 - NEW PT ICD-10 Description Diagnosis I89.0 Lymphedema, not elsewhere classified L97.222 Non-pressure chronic ulcer of left calf with fat l E66.01 Morbid (severe) obesity due to excess calories  F17.218 Nicotine dependence, cigarettes, with other nicoti Modifier: ayer exposed ne-induced di Quantity: 1 sorders CPT4 Code Description: [id number]- SMOKING CESSATION 3-10 MINS Description UWAIS, BOMMER ([id number]) Modifier: Quantity: 1 Electronic Signature(s) Signed: 01/16/2016 3:30:22 PM By: Christin Fudge MD, FACS Previous Signature: 01/16/2016 3:27:10 PM Version By: Christin Fudge MD, FACS Previous Signature: 01/16/2016 10:21:26 AM Version By: Christin Fudge MD, FACS Entered By: Christin Fudge on 01/16/2016 15:30:22

## 2016-01-17 NOTE — Progress Notes (Signed)
[name] ([id number]) Visit Report for 01/16/2016 Allergy List Details Patient Name: [name], [name] Date of Service: 01/16/2016 9:00 AM Medical Record Number: [id number] Patient Account Number: [id number] Date of Birth/Sex: 1965-08-30 (51 y.o. Male) Treating RN: Carolyne Fiscal, Debi Primary Care Physician: PATIENT, NO Other Clinician: Referring Physician: Loletha Grayer Treating Physician/Extender: Frann Rider in Treatment: 0 Allergies Active Allergies codeine lisinopril sea food Allergy Notes Electronic Signature(s) Signed: 01/16/2016 3:49:33 PM By: Alric Quan Entered By: Alric Quan on 01/16/2016 09:22:32 Smeal, Trashawn P. ([id number]) -------------------------------------------------------------------------------- Arrival Information Details Patient Name: [name], [name] Date of Service: 01/16/2016 9:00 AM Medical Record Number: [id number] Patient Account Number: [id number] Date of Birth/Sex: 26-Jul-1965 (51 y.o. Male) Treating RN: Carolyne Fiscal, Debi Primary Care Physician: PATIENT, NO Other Clinician: Referring Physician: Loletha Grayer Treating Physician/Extender: Frann Rider in Treatment: 0 Visit Information Patient Arrived: Ambulatory Arrival Time: 09:15 Accompanied By: sister Transfer Assistance: None Patient Identification Verified: Yes Secondary Verification Process Yes Completed: Patient Requires Transmission-Based No Precautions: Patient Has Alerts: Yes Patient Alerts: ASA Electronic Signature(s) Signed: 01/16/2016 3:49:33 PM By: Alric Quan Entered By: Alric Quan on 01/16/2016 09:17:01 Yoakum, [place]. ([id number]) -------------------------------------------------------------------------------- Clinic Level of Care Assessment Details Patient Name: [name], [name] Date of Service: 01/16/2016 9:00 AM Medical Record Number: [id number] Patient Account Number: [id number] Date of Birth/Sex: [birth year] (51 y.o.  Male) Treating RN: Carolyne Fiscal, Debi Primary Care Physician: PATIENT, NO Other Clinician: Referring Physician: Loletha Grayer Treating Physician/Extender: Frann Rider in Treatment: 0 Clinic Level of Care Assessment Items TOOL 2 Quantity Score X - Use when only an EandM is performed on the INITIAL visit 1 0 ASSESSMENTS - Nursing Assessment / Reassessment X - General Physical Exam (combine w/ comprehensive assessment (listed just 1 20 below) when performed on new pt. evals) X - Comprehensive Assessment (HX, ROS, Risk Assessments, Wounds Hx, etc.) 1 25 ASSESSMENTS - Wound and Skin Assessment / Reassessment []  - Simple Wound Assessment / Reassessment - one wound 0 X - Complex Wound Assessment / Reassessment - multiple wounds 2 5 []  - Dermatologic / Skin Assessment (not related to wound area) 0 ASSESSMENTS - Ostomy and/or Continence Assessment and Care []  - Incontinence Assessment and Management 0 []  - Ostomy Care Assessment and Management (repouching, etc.) 0 PROCESS - Coordination of Care []  - Simple Patient / Family Education for ongoing care 0 X - Complex (extensive) Patient / Family Education for ongoing care 1 20 X - Staff obtains Programmer, systems, Records, Test Results / Process Orders 1 10 X - Staff telephones HHA, Nursing Homes / Clarify orders / etc 1 10 []  - Routine Transfer to another Facility (non-emergent condition) 0 []  - Routine Hospital Admission (non-emergent condition) 0 X - New Admissions / Biomedical engineer / Ordering NPWT, Apligraf, etc. 1 15 []  - Emergency Hospital Admission (emergent condition) 0 X - Simple Discharge Coordination 1 10 Shed, Buford P. ([id number]) []  - Complex (extensive) Discharge Coordination 0 PROCESS - Special Needs []  - Pediatric / Minor Patient Management 0 []  - Isolation Patient Management 0 []  - Hearing / Language / Visual special needs 0 []  - Assessment of Community assistance (transportation, D/C planning, etc.) 0 []  -  Additional assistance / Altered mentation 0 []  - Support Surface(s) Assessment (bed, cushion, seat, etc.) 0 INTERVENTIONS - Wound Cleansing / Measurement X - Wound Imaging (photographs - any number of wounds) 1 5 []  - Wound Tracing (instead of photographs) 0 []  - Simple Wound Measurement - one wound 0 X - Complex Wound Measurement - multiple  wounds 2 5 []  - Simple Wound Cleansing - one wound 0 X - Complex Wound Cleansing - multiple wounds 2 5 INTERVENTIONS - Wound Dressings []  - Small Wound Dressing one or multiple wounds 0 []  - Medium Wound Dressing one or multiple wounds 0 X - Large Wound Dressing one or multiple wounds 1 20 []  - Application of Medications - injection 0 INTERVENTIONS - Miscellaneous []  - External ear exam 0 []  - Specimen Collection (cultures, biopsies, blood, body fluids, etc.) 0 []  - Specimen(s) / Culture(s) sent or taken to Lab for analysis 0 []  - Patient Transfer (multiple staff / Harrel Lemon Lift / Similar devices) 0 []  - Simple Staple / Suture removal (25 or less) 0 []  - Complex Staple / Suture removal (26 or more) 0 Jacko, Elridge P. ([id number]) []  - Hypo / Hyperglycemic Management (close monitor of Blood Glucose) 0 X - Ankle / Brachial Index (ABI) - do not check if billed separately 1 15 Has the patient been seen at the hospital within the last three years: Yes Total Score: 180 Level Of Care: New/Established - Level 5 Electronic Signature(s) Signed: 01/16/2016 3:49:33 PM By: Alric Quan Entered By: Alric Quan on 01/16/2016 15:06:28 Vanderzee, Benjie P. ([id number]) -------------------------------------------------------------------------------- Encounter Discharge Information Details Patient Name: [name], [name] Date of Service: 01/16/2016 9:00 AM Medical Record Number: [id number] Patient Account Number: [id number] Date of Birth/Sex: 1965-05-02 (51 y.o. Male) Treating RN: Carolyne Fiscal, Debi Primary Care Physician: PATIENT, NO Other Clinician: Referring  Physician: Loletha Grayer Treating Physician/Extender: Frann Rider in Treatment: 0 Encounter Discharge Information Items Discharge Pain Level: 3 Discharge Condition: Stable Ambulatory Status: Ambulatory Discharge Destination: Home Transportation: Private Auto Accompanied By: sister Schedule Follow-up Appointment: Yes Medication Reconciliation completed No and provided to Patient/Care Shalayah Beagley: Provided on Clinical Summary of Care: 01/16/2016 Form Type Recipient Electronic Patient RB Electronic Signature(s) Signed: 01/16/2016 10:37:23 AM By: Ruthine Dose Entered By: Ruthine Dose on 01/16/2016 10:37:23 Payette, Carsin Mamie Nick ([id number]) -------------------------------------------------------------------------------- Lower Extremity Assessment Details Patient Name: Grape, Sherman P. Date of Service: 01/16/2016 9:00 AM Medical Record Number: [id number] Patient Account Number: [id number] Date of Birth/Sex: 18-Aug-1965 (51 y.o. Male) Treating RN: Carolyne Fiscal, Debi Primary Care Physician: PATIENT, NO Other Clinician: Referring Physician: Loletha Grayer Treating Physician/Extender: Frann Rider in Treatment: 0 Edema Assessment Assessed: [Left: No] [Right: No] Edema: [Left: Yes] [Right: Yes] Calf Left: Right: Point of Measurement: 34 cm From Medial Instep 52.2 cm 47.8 cm Ankle Left: Right: Point of Measurement: 11 cm From Medial Instep 29.8 cm 28.4 cm Vascular Assessment Pulses: Dorsalis Pedis Palpable: [Left:Yes] [Right:Yes] Posterior Tibial Extremity colors, hair growth, and conditions: Extremity Color: [Left:Red] [Right:Red] Temperature of Extremity: [Left:Warm] [Right:Warm] Capillary Refill: [Left:< 3 seconds] [Right:< 3 seconds] Blood Pressure: Brachial: [Left:122] [Right:122] Dorsalis Pedis: 120 [Left:Dorsalis Pedis: 90] Ankle: Posterior Tibial: 118 [Left:Posterior Tibial: 90 0.98] [Right:0.74] Toe Nail Assessment Left: Right: Thick: Yes  Yes Discolored: Yes Yes Deformed: No No Improper Length and Hygiene: Yes Yes Electronic Signature(s) Signed: 01/16/2016 3:49:33 PM By: Tonita Phoenix, Wendie Chess ([id number]) Entered By: Alric Quan on 01/16/2016 09:50:05 Vanderschaaf, Rue P. ([id number]) -------------------------------------------------------------------------------- Multi Wound Chart Details Patient Name: Etsitty, Jhoan P. Date of Service: 01/16/2016 9:00 AM Medical Record Number: [id number] Patient Account Number: [id number] Date of Birth/Sex: [birth year] (51 y.o. Male) Treating RN: Carolyne Fiscal, Debi Primary Care Physician: PATIENT, NO Other Clinician: Referring Physician: Loletha Grayer Treating Physician/Extender: Frann Rider in Treatment: 0 Vital Signs Height(in): 69 Pulse(bpm): 102 Weight(lbs): 316 Blood Pressure 139/84 (mmHg): Body Mass Index(BMI): 47 Temperature(F): 97.9 Respiratory  Rate 18 (breaths/min): Photos: [1:No Photos] [2:No Photos] [N/A:N/A] Wound Location: [1:Left Lower Leg - Anterior] [2:Left Lower Leg - Posterior] [N/A:N/A] Wounding Event: [1:Gradually Appeared] [2:Gradually Appeared] [N/A:N/A] Primary Etiology: [1:Venous Leg Ulcer] [2:Venous Leg Ulcer] [N/A:N/A] Comorbid History: [1:Chronic Obstructive Pulmonary Disease (COPD), Sleep Apnea, Congestive Heart Failure, Coronary Artery Disease, Seizure Disorder] [2:Chronic Obstructive Pulmonary Disease (COPD), Sleep Apnea, Congestive Heart Failure, Coronary Artery  Disease, Seizure Disorder] [N/A:N/A] Date Acquired: [1:12/26/2015] [2:12/26/2015] [N/A:N/A] Weeks of Treatment: [1:0] [2:0] [N/A:N/A] Wound Status: [1:Open] [2:Open] [N/A:N/A] Measurements L x W x D 6x5.5x0.1 [2:10.7x10.5x0.1] [N/A:N/A] (cm) Area (cm) : [1:25.918] [2:88.239] [N/A:N/A] Volume (cm) : [1:2.592] [4:0.086] [N/A:N/A] Classification: [1:Partial Thickness] [2:Partial Thickness] [N/A:N/A] Exudate Amount: [1:Large] [2:Large] [N/A:N/A] Exudate Type:  [1:Serous] [2:Serous] [N/A:N/A] Exudate Color: [1:amber] [2:amber] [N/A:N/A] Wound Margin: [1:Distinct, outline attached] [2:Distinct, outline attached] [N/A:N/A] Granulation Amount: [1:None Present (0%)] [2:None Present (0%)] [N/A:N/A] Necrotic Amount: [1:Large (67-100%)] [2:Large (67-100%)] [N/A:N/A] Exposed Structures: [1:Fascia: No Fat: No Tendon: No Muscle: No Joint: No Bone: No] [2:Fascia: No Fat: No Tendon: No Muscle: No Joint: No Bone: No] [N/A:N/A] Limited to Skin Limited to Skin Breakdown Breakdown Epithelialization: None None N/A Periwound Skin Texture: Edema: Yes Edema: Yes N/A Periwound Skin Maceration: Yes Maceration: Yes N/A Moisture: Moist: Yes Moist: Yes Periwound Skin Color: Erythema: Yes Erythema: Yes N/A Erythema Location: Circumferential Circumferential N/A Temperature: No Abnormality No Abnormality N/A Tenderness on Yes Yes N/A Palpation: Wound Preparation: Ulcer Cleansing: Ulcer Cleansing: N/A Rinsed/Irrigated with Rinsed/Irrigated with Saline, Other: soap and Saline, Other: soap and water water Topical Anesthetic Topical Anesthetic Applied: Other: lidocaine Applied: Other: lidocaine 4% 4% Treatment Notes Electronic Signature(s) Signed: 01/16/2016 10:16:16 AM By: Christin Fudge MD, FACS Entered By: Christin Fudge on 01/16/2016 10:16:16 Zelaya, Heidi Mamie Nick ([id number]) -------------------------------------------------------------------------------- [place] Details Patient Name: Seats, Udell P. Date of Service: 01/16/2016 9:00 AM Medical Record Number: [id number] Patient Account Number: [id number] Date of Birth/Sex: Sep 24, 1965 (51 y.o. Male) Treating RN: Carolyne Fiscal, Debi Primary Care Physician: PATIENT, NO Other Clinician: Referring Physician: Loletha Grayer Treating Physician/Extender: Frann Rider in Treatment: 0 Active Inactive Abuse / Safety / Falls / Self Care Management Nursing Diagnoses: Potential for  falls Goals: Patient will remain injury free Date Initiated: 01/16/2016 Goal Status: Active Interventions: Assess fall risk on admission and as needed Assess self care needs on admission and as needed Notes: Nutrition Nursing Diagnoses: Imbalanced nutrition Potential for alteratiion in Nutrition/Potential for imbalanced nutrition Goals: Patient/caregiver agrees to and verbalizes understanding of need to use nutritional supplements and/or vitamins as prescribed Date Initiated: 01/16/2016 Goal Status: Active Interventions: Assess patient nutrition upon admission and as needed per policy Notes: Orientation to the Wound Care Program Nursing Diagnoses: Knowledge deficit related to the wound healing center program AKHIL, PISCOPO ([id number]) Goals: Patient/caregiver will verbalize understanding of the [place] Date Initiated: 01/16/2016 Goal Status: Active Interventions: Provide education on orientation to the wound center Notes: Pain, Acute or Chronic Nursing Diagnoses: Pain, acute or chronic: actual or potential Potential alteration in comfort, pain Goals: Patient will verbalize adequate pain control and receive pain control interventions during procedures as needed Date Initiated: 01/16/2016 Goal Status: Active Patient/caregiver will verbalize adequate pain control between visits Date Initiated: 01/16/2016 Goal Status: Active Patient/caregiver will verbalize comfort level met Date Initiated: 01/16/2016 Goal Status: Active Interventions: Assess comfort goal upon admission Complete pain assessment as per visit requirements Notes: Venous Leg Ulcer Nursing Diagnoses: Actual venous Insuffiency (use after diagnosis is confirmed) Knowledge deficit related to disease process and management Potential for venous  Insuffiency (use before diagnosis confirmed) Goals: Patient will maintain optimal edema control Date Initiated: 01/16/2016 Goal Status:  Active Patient/caregiver will verbalize understanding of disease process and disease management ZAVON, HYSON ([id number]) Date Initiated: 01/16/2016 Goal Status: Active Interventions: Assess peripheral edema status every visit. Provide education on venous insufficiency Notes: Wound/Skin Impairment Nursing Diagnoses: Impaired tissue integrity Knowledge deficit related to smoking impact on wound healing Knowledge deficit related to ulceration/compromised skin integrity Goals: Ulcer/skin breakdown will have a volume reduction of 30% by week 4 Date Initiated: 01/16/2016 Goal Status: Active Ulcer/skin breakdown will have a volume reduction of 50% by week 8 Date Initiated: 01/16/2016 Goal Status: Active Ulcer/skin breakdown will have a volume reduction of 80% by week 12 Date Initiated: 01/16/2016 Goal Status: Active Interventions: Assess patient/caregiver ability to perform ulcer/skin care regimen upon admission and as needed Assess ulceration(s) every visit Provide education on smoking Provide education on ulcer and skin care Notes: Electronic Signature(s) Signed: 01/16/2016 3:49:33 PM By: Alric Quan Entered By: Alric Quan on 01/16/2016 10:06:48 Liebman, Mikal P. ([id number]) -------------------------------------------------------------------------------- Pain Assessment Details Patient Name: Carre, Joahan P. Date of Service: 01/16/2016 9:00 AM Medical Record Number: [id number] Patient Account Number: [id number] Date of Birth/Sex: July 26, 1965 (51 y.o. Male) Treating RN: Carolyne Fiscal, Debi Primary Care Physician: PATIENT, NO Other Clinician: Referring Physician: Loletha Grayer Treating Physician/Extender: Frann Rider in Treatment: 0 Active Problems Location of Pain Severity and Description of Pain Patient Has Paino Yes Site Locations Pain Location: Pain in Ulcers With Dressing Change: Yes Duration of the Pain. Constant / Intermittento Constant Rate the  pain. Current Pain Level: 5 Worst Pain Level: 10 Least Pain Level: 4 Character of Pain Describe the Pain: Burning, Throbbing Pain Management and Medication Current Pain Management: Electronic Signature(s) Signed: 01/16/2016 3:49:33 PM By: Alric Quan Entered By: Alric Quan on 01/16/2016 09:17:36 Helseth, Tramayne Mamie Nick ([id number]) -------------------------------------------------------------------------------- Patient/Caregiver Education Details Patient Name: Ingalls, Swan P. Date of Service: 01/16/2016 9:00 AM Medical Record Number: [id number] Patient Account Number: [id number] Date of Birth/Gender: 1965/06/25 (51 y.o. Male) Treating RN: Carolyne Fiscal, Debi Primary Care Physician: PATIENT, NO Other Clinician: Referring Physician: Loletha Grayer Treating Physician/Extender: Frann Rider in Treatment: 0 Education Assessment Education Provided To: Patient Education Topics Provided Smoking and Wound Healing: Handouts: Smoking and Wound Healing Methods: Explain/Verbal Responses: State content correctly Venous: Handouts: Controlling Swelling with Multilayered Compression Wraps Methods: Demonstration, Explain/Verbal Responses: State content correctly Welcome To [place]: Handouts: Welcome To [place] Methods: Explain/Verbal Responses: State content correctly Wound/Skin Impairment: Handouts: Other: change dressing as ordered Methods: Demonstration, Explain/Verbal Responses: State content correctly Electronic Signature(s) Signed: 01/16/2016 3:49:33 PM By: Alric Quan Entered By: Alric Quan on 01/16/2016 10:49:26 Quach, Beau P. ([id number]) -------------------------------------------------------------------------------- Wound Assessment Details Patient Name: Arvizu, Camren P. Date of Service: 01/16/2016 9:00 AM Medical Record Number: [id number] Patient Account Number: [id number] Date of Birth/Sex: 1965/09/22 (51 y.o.  Male) Treating RN: Carolyne Fiscal, Debi Primary Care Physician: PATIENT, NO Other Clinician: Referring Physician: Loletha Grayer Treating Physician/Extender: Frann Rider in Treatment: 0 Wound Status Wound Number: 1 Primary Venous Leg Ulcer Etiology: Wound Location: Left Lower Leg - Anterior Wound Open Wounding Event: Gradually Appeared Status: Date Acquired: 12/26/2015 Comorbid Chronic Obstructive Pulmonary Disease Weeks Of Treatment: 0 History: (COPD), Sleep Apnea, Congestive Clustered Wound: No Heart Failure, Coronary Artery Disease, Seizure Disorder Photos Photo Uploaded By: Alric Quan on 01/16/2016 11:35:10 Wound Measurements Length: (cm) 6 Width: (cm) 5.5 Depth: (cm) 0.1 Area: (cm) 25.918 Volume: (cm) 2.592 % Reduction in Area: % Reduction  in Volume: Epithelialization: None Tunneling: No Undermining: No Wound Description Classification: Partial Thickness Wound Margin: Distinct, outline attached Exudate Amount: Large Exudate Type: Serous Exudate Color: amber Foul Odor After Cleansing: No Wound Bed Granulation Amount: None Present (0%) Exposed Structure Necrotic Amount: Large (67-100%) Fascia Exposed: No Necrotic Quality: Adherent Slough Fat Layer Exposed: No Lorincz, Ison P. ([id number]) Tendon Exposed: No Muscle Exposed: No Joint Exposed: No Bone Exposed: No Limited to Skin Breakdown Periwound Skin Texture Texture Color No Abnormalities Noted: No No Abnormalities Noted: No Localized Edema: Yes Erythema: Yes Erythema Location: Circumferential Moisture No Abnormalities Noted: No Temperature / Pain Maceration: Yes Temperature: No Abnormality Moist: Yes Tenderness on Palpation: Yes Wound Preparation Ulcer Cleansing: Rinsed/Irrigated with Saline, Other: soap and water, Topical Anesthetic Applied: Other: lidocaine 4%, Treatment Notes Wound #1 (Left, Anterior Lower Leg) 1. Cleansed with: Clean wound with Normal Saline Cleanse  wound with antibacterial soap and water 2. Anesthetic Topical Lidocaine 4% cream to wound bed prior to debridement 4. Dressing Applied: Aquacel Ag 5. Secondary Dressing Applied ABD Pad 7. Secured with Tape 3 Layer Compression System - Left Lower Extremity Notes xtrasorb, unna to Engineer, production) Signed: 01/16/2016 3:49:33 PM By: Alric Quan Entered By: Alric Quan on 01/16/2016 09:53:09 Wambold, Buren P. ([id number]) -------------------------------------------------------------------------------- Wound Assessment Details Patient Name: Mauger, Mikie P. Date of Service: 01/16/2016 9:00 AM Medical Record Number: [id number] Patient Account Number: [id number] Date of Birth/Sex: 1965-06-17 (51 y.o. Male) Treating RN: Carolyne Fiscal, Debi Primary Care Physician: PATIENT, NO Other Clinician: Referring Physician: Loletha Grayer Treating Physician/Extender: Frann Rider in Treatment: 0 Wound Status Wound Number: 2 Primary Venous Leg Ulcer Etiology: Wound Location: Left Lower Leg - Posterior Wound Open Wounding Event: Gradually Appeared Status: Date Acquired: 12/26/2015 Comorbid Chronic Obstructive Pulmonary Disease Weeks Of Treatment: 0 History: (COPD), Sleep Apnea, Congestive Clustered Wound: No Heart Failure, Coronary Artery Disease, Seizure Disorder Photos Photo Uploaded By: Alric Quan on 01/16/2016 11:35:10 Wound Measurements Length: (cm) 10.7 Width: (cm) 10.5 Depth: (cm) 0.1 Area: (cm) 88.239 Volume: (cm) 8.824 % Reduction in Area: % Reduction in Volume: Epithelialization: None Tunneling: No Undermining: No Wound Description Classification: Partial Thickness Wound Margin: Distinct, outline attached Exudate Amount: Large Exudate Type: Serous Exudate Color: amber Foul Odor After Cleansing: No Wound Bed Granulation Amount: None Present (0%) Exposed Structure Necrotic Amount: Large (67-100%) Fascia Exposed: No Necrotic  Quality: Adherent Slough Fat Layer Exposed: No Duba, Jancarlo P. ([id number]) Tendon Exposed: No Muscle Exposed: No Joint Exposed: No Bone Exposed: No Limited to Skin Breakdown Periwound Skin Texture Texture Color No Abnormalities Noted: No No Abnormalities Noted: No Localized Edema: Yes Erythema: Yes Erythema Location: Circumferential Moisture No Abnormalities Noted: No Temperature / Pain Maceration: Yes Temperature: No Abnormality Moist: Yes Tenderness on Palpation: Yes Wound Preparation Ulcer Cleansing: Rinsed/Irrigated with Saline, Other: soap and water, Topical Anesthetic Applied: Other: lidocaine 4%, Treatment Notes Wound #2 (Left, Posterior Lower Leg) 1. Cleansed with: Clean wound with Normal Saline Cleanse wound with antibacterial soap and water 2. Anesthetic Topical Lidocaine 4% cream to wound bed prior to debridement 4. Dressing Applied: Aquacel Ag 5. Secondary Dressing Applied ABD Pad 7. Secured with Tape 3 Layer Compression System - Left Lower Extremity Notes xtrasorb, unna to Engineer, production) Signed: 01/16/2016 3:49:33 PM By: Alric Quan Entered By: Alric Quan on 01/16/2016 09:56:11 Apachito, Rhet P. ([id number]) -------------------------------------------------------------------------------- Vitals Details Patient Name: Barrasso, Jheremy P. Date of Service: 01/16/2016 9:00 AM Medical Record Number: [id number] Patient Account Number: [id number] Date of Birth/Sex: 1965/07/11 (51 y.o. Male) Treating  RN: Ahmed Prima Primary Care Physician: PATIENT, NO Other Clinician: Referring Physician: Loletha Grayer Treating Physician/Extender: Frann Rider in Treatment: 0 Vital Signs Time Taken: 09:17 Temperature (F): 97.9 Height (in): 69 Pulse (bpm): 102 Source: Stated Respiratory Rate (breaths/min): 18 Weight (lbs): 316 Blood Pressure (mmHg): 139/84 Source: Measured Reference Range: 80 - 120 mg / dl Body Mass Index  (BMI): 46.7 Electronic Signature(s) Signed: 01/16/2016 3:49:33 PM By: Alric Quan Entered By: Alric Quan on 01/16/2016 09:19:53

## 2016-01-17 NOTE — Progress Notes (Signed)
DAR, BAILE ([id number]) Visit Report for 01/16/2016 Abuse/Suicide Risk Screen Details Patient Name: [name], [name] Date of Service: 01/16/2016 9:00 AM Medical Record Number: [id number] Patient Account Number: [id number] Date of Birth/Sex: 23-Sep-1965 (52 y.o. Male) Treating RN: Carolyne Fiscal, Debi Primary Care Physician: PATIENT, NO Other Clinician: Referring Physician: Loletha Grayer Treating Physician/Extender: Frann Rider in Treatment: 0 Abuse/Suicide Risk Screen Items Answer ABUSE/SUICIDE RISK SCREEN: Has anyone close to you tried to hurt or harm you recentlyo No Do you feel uncomfortable with anyone in your familyo No Has anyone forced you do things that you didnot want to doo No Do you have any thoughts of harming yourselfo No Patient displays signs or symptoms of abuse and/or neglect. No Electronic Signature(s) Signed: 01/16/2016 3:49:33 PM By: Alric Quan Entered By: Alric Quan on 01/16/2016 09:28:47 [name], [name] ([id number]) -------------------------------------------------------------------------------- Activities of Daily Living Details Patient Name: [name], [name] Date of Service: 01/16/2016 9:00 AM Medical Record Number: [id number] Patient Account Number: [id number] Date of Birth/Sex: September 21, 1965 (51 y.o. Male) Treating RN: Carolyne Fiscal, Debi Primary Care Physician: PATIENT, NO Other Clinician: Referring Physician: Loletha Grayer Treating Physician/Extender: Frann Rider in Treatment: 0 Activities of Daily Living Items Answer Activities of Daily Living (Please select one for each item) Drive Automobile Completely Able Take Medications Completely Able Use Telephone Completely Able Care for Appearance Completely Able Use Toilet Completely Able Bath / Shower Completely Able Dress Self Completely Able Feed Self Completely Able Walk Completely Able Get In / Out Bed Completely Able Housework Completely Able Prepare Meals  Completely Able Handle Money Completely Able Shop for Self Completely Able Electronic Signature(s) Signed: 01/16/2016 3:49:33 PM By: Alric Quan Entered By: Alric Quan on 01/16/2016 09:29:12 [name], [name] ([id number]) -------------------------------------------------------------------------------- Education Assessment Details Patient Name: [name], [name] Date of Service: 01/16/2016 9:00 AM Medical Record Number: [id number] Patient Account Number: [id number] Date of Birth/Sex: 1965/09/19 (51 y.o. Male) Treating RN: Carolyne Fiscal, Debi Primary Care Physician: PATIENT, NO Other Clinician: Referring Physician: Loletha Grayer Treating Physician/Extender: Frann Rider in Treatment: 0 Primary Learner Assessed: Patient Learning Preferences/Education Level/Primary Language Learning Preference: Explanation, Printed Material Highest Education Level: College or Above Preferred Language: English Cognitive Barrier Assessment/Beliefs Language Barrier: No Translator Needed: No Memory Deficit: No Emotional Barrier: No Cultural/Religious Beliefs Affecting Medical No Care: Physical Barrier Assessment Impaired Vision: No Impaired Hearing: No Decreased Hand dexterity: No Knowledge/Comprehension Assessment Knowledge Level: High Comprehension Level: High Ability to understand written High instructions: Ability to understand verbal High instructions: Motivation Assessment Anxiety Level: Calm Cooperation: Cooperative Education Importance: Acknowledges Need Interest in Health Problems: Asks Questions Perception: Coherent Willingness to Engage in Self- High Management Activities: Readiness to Engage in Self- High Management Activities: Electronic Signature(s) [name], [name] ([id number]) Signed: 01/16/2016 3:49:33 PM By: Alric Quan Entered By: Alric Quan on 01/16/2016 09:29:32 [name], [name]  ([id number]) -------------------------------------------------------------------------------- Fall Risk Assessment Details Patient Name: [name], [name] Date of Service: 01/16/2016 9:00 AM Medical Record Number: [id number] Patient Account Number: [id number] Date of Birth/Sex: 1965-12-16 (51 y.o. Male) Treating RN: Carolyne Fiscal, Debi Primary Care Physician: PATIENT, NO Other Clinician: Referring Physician: Loletha Grayer Treating Physician/Extender: Frann Rider in Treatment: 0 Fall Risk Assessment Items Have you had 2 or more falls in the last 12 monthso 0 No Have you had any fall that resulted in injury in the last 12 monthso 0 No FALL RISK ASSESSMENT: History of falling - immediate or within 3 months 0 No Secondary diagnosis 0 No Ambulatory aid None/bed rest/wheelchair/nurse 0 No Crutches/cane/walker 0 No  Furniture 0 No IV Access/Saline Lock 0 No Gait/Training Normal/bed rest/immobile 0 No Weak 10 Yes Impaired 0 No Mental Status Oriented to own ability 0 Yes Electronic Signature(s) Signed: 01/16/2016 3:49:33 PM By: Alric Quan Entered By: Alric Quan on 01/16/2016 09:30:00 [name], [name] ([id number]) -------------------------------------------------------------------------------- Foot Assessment Details Patient Name: [name], [name] Date of Service: 01/16/2016 9:00 AM Medical Record Number: [id number] Patient Account Number: [id number] Date of Birth/Sex: March 13, 1965 (51 y.o. Male) Treating RN: Carolyne Fiscal, Debi Primary Care Physician: PATIENT, NO Other Clinician: Referring Physician: Loletha Grayer Treating Physician/Extender: Frann Rider in Treatment: 0 Foot Assessment Items Site Locations + = Sensation present, - = Sensation absent, C = Callus, U = Ulcer R = Redness, W = Warmth, M = Maceration, PU = Pre-ulcerative lesion F = Fissure, S = Swelling, D = Dryness Assessment Right: Left: Other Deformity: No No Prior Foot Ulcer: No No Prior  Amputation: No No Charcot Joint: No No Ambulatory Status: Ambulatory Without Help Gait: Steady Electronic Signature(s) Signed: 01/16/2016 3:49:33 PM By: Alric Quan Entered By: Alric Quan on 01/16/2016 09:37:27 [name], [name] ([id number]) -------------------------------------------------------------------------------- Nutrition Risk Assessment Details Patient Name: [name], [name] Date of Service: 01/16/2016 9:00 AM Medical Record Number: [id number] Patient Account Number: [id number] Date of Birth/Sex: [birth year] (51 y.o. Male) Treating RN: Carolyne Fiscal, Debi Primary Care Physician: PATIENT, NO Other Clinician: Referring Physician: Loletha Grayer Treating Physician/Extender: Frann Rider in Treatment: 0 Height (in): 69 Weight (lbs): 316 Body Mass Index (BMI): 46.7 Nutrition Risk Assessment Items NUTRITION RISK SCREEN: I have an illness or condition that made me change the kind and/or 2 Yes amount of food I eat I eat fewer than two meals per day 0 No I eat few fruits and vegetables, or milk products 0 No I have three or more drinks of beer, liquor or wine almost every day 0 No I have tooth or mouth problems that make it hard for me to eat 0 No I don't always have enough money to buy the food I need 0 No I eat alone most of the time 1 Yes I take three or more different prescribed or over-the-counter drugs a 1 Yes day Without wanting to, I have lost or gained 10 pounds in the last six 0 No months I am not always physically able to shop, cook and/or feed myself 0 No Nutrition Protocols Good Risk Protocol Moderate Risk Protocol Electronic Signature(s) Signed: 01/16/2016 3:49:33 PM By: Alric Quan Entered By: Alric Quan on 01/16/2016 09:30:25

## 2016-01-23 ENCOUNTER — Encounter: Payer: BLUE CROSS/BLUE SHIELD | Admitting: Surgery

## 2016-01-23 DIAGNOSIS — I890 Lymphedema, not elsewhere classified: Secondary | ICD-10-CM | POA: Diagnosis not present

## 2016-01-23 NOTE — Progress Notes (Addendum)
DAKARRI, COMPHER ([id number]) Visit Report for 01/23/2016 Chief Complaint Document Details Patient Name: [name], [name] Date of Service: 01/23/2016 12:45 PM Medical Record Number: [id number] Patient Account Number: [id number] Date of Birth/Sex: [birth year] (51 y.o. Male) Treating RN: Carolyne Fiscal, Debi Primary Care Physician: PATIENT, NO Other Clinician: Referring Physician: Loletha Grayer Treating Physician/Extender: Frann Rider in Treatment: 1 Information Obtained from: Patient Chief Complaint Patient presents to the wound care center for a consult due non healing wound for bilateral lower extremity edema which she's had for several months Electronic Signature(s) Signed: 01/23/2016 1:14:40 PM By: Christin Fudge MD, FACS Entered By: Christin Fudge on 01/23/2016 13:14:39 [place], Treavor P. ([id number]) -------------------------------------------------------------------------------- HPI Details Patient Name: [name], [name] Date of Service: 01/23/2016 12:45 PM Medical Record Number: [id number] Patient Account Number: [id number] Date of Birth/Sex: [birth year] (51 y.o. Male) Treating RN: Carolyne Fiscal, Debi Primary Care Physician: PATIENT, NO Other Clinician: Referring Physician: Loletha Grayer Treating Physician/Extender: Frann Rider in Treatment: 1 History of Present Illness Location: a lateral lower extremity edema with swelling and infection of the left lower extremity Quality: Patient reports experiencing a sharp pain to affected area(s). Severity: Patient states wound are getting worse. Duration: Patient has had the wound for > 3 months prior to seeking treatment at the wound center Timing: Pain in wound is constant (hurts all the time) Context: The wound appeared gradually over time Modifying Factors: Consults to this date include:was admitted to the hospital for IV antibiotics and treatment Associated Signs and Symptoms: Patient reports having increase  discharge. HPI Description: 50 year old gentleman who is a current every day smoker and smokes a pack of cigarettes a day for the last 30 years also is known to have had a recent admission to the hospital between December 25 and 01/09/2016 and was admitted for swelling and cellulitis of the left lower extremity. he has a past medical history of COPD, coronary artery disease, hypertension, peptic ulcer and sleep apnea. His admission he was known to have cellulitis the left lower extremity and bilateral lower extremity lymphedema, and was placed on aggressive antibiotics( vancomycin and Zosyn) and Unna boot and change to Ancef later. He was also treated for COPD, essential hypertension and nicotine abuse. An echo done and showed a normal ejection fraction and was started on low-dose Lasix. he was discharged home on Keflex. during the admission of a duplex study was done for possible DVT and there was no evidence of this found in the left lower extremity. 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 In the process of finding a PCP here. 01/23/2016 -- his vascular workup is later this month and his PCP appointment is not too early February. Electronic Signature(s) Signed: 01/23/2016 1:15:09 PM By: Christin Fudge MD, FACS Entered By: Christin Fudge on 01/23/2016 13:15:09 [name], [name] ([id number]) -------------------------------------------------------------------------------- Physical Exam Details Patient Name: [name], [name] Date of Service: 01/23/2016 12:45 PM Medical Record Number: [id number] Patient Account Number: [id number] Date of Birth/Sex: [birth year] (51 y.o. Male) Treating RN: Carolyne Fiscal, Debi Primary Care Physician: PATIENT, NO Other Clinician: Referring Physician: Loletha Grayer Treating Physician/Extender: Frann Rider in Treatment: 1 Constitutional . Pulse regular. Respirations normal and unlabored. Afebrile.  . Eyes Nonicteric. Reactive to light. Ears, Nose, Mouth, and Throat Lips, teeth, and gums WNL.Marland Kitchen Moist mucosa without lesions. Neck supple and nontender. No palpable supraclavicular or cervical adenopathy. Normal sized without goiter. Respiratory WNL. No retractions.. Breath sounds WNL, No rubs, rales, rhonchi,  or wheeze.. Cardiovascular Heart rhythm and rate regular, no murmur or gallop.. Pedal Pulses WNL. No clubbing, cyanosis or edema. Chest Breasts symmetical and no nipple discharge.. Breast tissue WNL, no masses, lumps, or tenderness.. Lymphatic No adneopathy. No adenopathy. No adenopathy. Musculoskeletal Adexa without tenderness or enlargement.. Digits and nails w/o clubbing, cyanosis, infection, petechiae, ischemia, or inflammatory conditions.. Integumentary (Hair, Skin) No suspicious lesions. No crepitus or fluctuance. No peri-wound warmth or erythema. No masses.Marland Kitchen Psychiatric Judgement and insight Intact.. No evidence of depression, anxiety, or agitation.. Notes The left lower extremity continues to have significant amount of debris around the lower anterior lateral part, but the weeping and lymphedema is less. He has mild cellulitis and I will continue to treat him with local care and oral antibiotics Electronic Signature(s) Signed: 01/23/2016 1:16:28 PM By: Christin Fudge MD, FACS Entered By: Christin Fudge on 01/23/2016 13:16:28 [name], [name] ([id number]) -------------------------------------------------------------------------------- Physician Orders Details Patient Name: [name], [name] Date of Service: 01/23/2016 12:45 PM Medical Record Number: [id number] Patient Account Number: [id number] Date of Birth/Sex: 12/15/1965 (51 y.o. Male) Treating RN: Carolyne Fiscal, Debi Primary Care Physician: PATIENT, NO Other Clinician: Referring Physician: Loletha Grayer Treating Physician/Extender: Frann Rider in Treatment: 1 Verbal / Phone Orders: Yes Clinician:  Carolyne Fiscal, Debi Read Back and Verified: Yes Diagnosis Coding ICD-10 Coding Code Description I89.0 Lymphedema, not elsewhere classified L97.222 Non-pressure chronic ulcer of left calf with fat layer exposed E66.01 Morbid (severe) obesity due to excess calories F17.218 Nicotine dependence, cigarettes, with other nicotine-induced disorders L03.116 Cellulitis of left lower limb Wound Cleansing Wound #1 Left,Anterior Lower Leg o Clean wound with Normal Saline. o Cleanse wound with mild soap and water - HHRN to clean with soap and water when changing wraps. Wound #2 Left,Posterior Lower Leg o Clean wound with Normal Saline. o Cleanse wound with mild soap and water - HHRN to clean with soap and water when changing wraps. Anesthetic Wound #1 Left,Anterior Lower Leg o Topical Lidocaine 4% cream applied to wound bed prior to debridement - for clinic use Wound #2 Left,Posterior Lower Leg o Topical Lidocaine 4% cream applied to wound bed prior to debridement - for clinic use Primary Wound Dressing Wound #1 Left,Anterior Lower Leg o Aquacel Ag Wound #2 Left,Posterior Lower Leg o Aquacel Ag Secondary Dressing Wound #1 Left,Anterior Lower Leg [place], Jeyren P. ([id number]) o ABD pad o XtraSorb Wound #2 Left,Posterior Lower Leg o ABD pad o XtraSorb Dressing Change Frequency Wound #1 Left,Anterior Lower Leg o Three times weekly - HHRN to change Mondays and Wednesdays. Pt will come to wound care clinic on Fridays to have it changed there. Wound #2 Left,Posterior Lower Leg o Three times weekly - HHRN to change Mondays and Wednesdays. Pt will come to wound care clinic on Fridays to have it changed there. Follow-up Appointments Wound #1 Left,Anterior Lower Leg o Return Appointment in 1 week. Wound #2 Left,Posterior Lower Leg o Return Appointment in 1 week. Edema Control Wound #1 Left,Anterior Lower Leg o 3 Layer Compression System - Left Lower Extremity  - unna to anchor o Elevate legs to the level of the heart and pump ankles as often as possible Wound #2 Left,Posterior Lower Leg o 3 Layer Compression System - Left Lower Extremity - unna to anchor o Elevate legs to the level of the heart and pump ankles as often as possible Additional Orders / Instructions Wound #1 Left,Anterior Lower Leg o Stop Smoking o Increase protein intake. Wound #2 Left,Posterior Lower Leg o Stop Smoking o Increase protein intake.  Home Health Wound #1 [name] Visits - [facility] to change Mondays and Wednesdays. Pt will come to wound care clinic on Fridays to have it changed there. o Home Health Nurse may visit PRN to address patientos wound care needs. BYSON, GAYLORD ([id number]) o FACE TO FACE ENCOUNTER: MEDICARE and MEDICAID PATIENTS: I certify that this patient is under my care and that I had a face-to-face encounter that meets the physician face-to-face encounter requirements with this patient on this date. The encounter with the patient was in whole or in part for the following MEDICAL CONDITION: (primary reason for [place]) MEDICAL NECESSITY: I certify, that based on my findings, NURSING services are a medically necessary home health service. HOME BOUND STATUS: I certify that my clinical findings support that this patient is homebound (i.e., Due to illness or injury, pt requires aid of supportive devices such as crutches, cane, wheelchairs, walkers, the use of special transportation or the assistance of another person to leave their place of residence. There is a normal inability to leave the home and doing so requires considerable and taxing effort. Other absences are for medical reasons / religious services and are infrequent or of short duration when for other reasons). o If current dressing causes regression in wound condition, may D/C ordered dressing product/s and apply Normal Saline  Moist Dressing daily until next [place] / Other MD appointment. [place] of regression in wound condition at [phone]. o Please direct any NON-WOUND related issues/requests for orders to patient's Primary Care Physician Wound #2 [place] Visits - [facility] to change Mondays and Wednesdays. Pt will come to wound care clinic on Fridays to have it changed there. o Home Health Nurse may visit PRN to address patientos wound care needs. o FACE TO FACE ENCOUNTER: MEDICARE and MEDICAID PATIENTS: I certify that this patient is under my care and that I had a face-to-face encounter that meets the physician face-to-face encounter requirements with this patient on this date. The encounter with the patient was in whole or in part for the following MEDICAL CONDITION: (primary reason for [place]) MEDICAL NECESSITY: I certify, that based on my findings, NURSING services are a medically necessary home health service. HOME BOUND STATUS: I certify that my clinical findings support that this patient is homebound (i.e., Due to illness or injury, pt requires aid of supportive devices such as crutches, cane, wheelchairs, walkers, the use of special transportation or the assistance of another person to leave their place of residence. There is a normal inability to leave the home and doing so requires considerable and taxing effort. Other absences are for medical reasons / religious services and are infrequent or of short duration when for other reasons). o If current dressing causes regression in wound condition, may D/C ordered dressing product/s and apply Normal Saline Moist Dressing daily until next [place] / Other MD appointment. [place] of regression in wound condition at [phone]. o Please direct any NON-WOUND related issues/requests for orders to patient's Primary  Care Physician Medications-please add to medication list. Wound #1 Left,Anterior Lower Leg o BellO. Antibiotics - Doxycycline o Other: - Vitamin C, Zinc, Multivitamin Wound #2 Left,Posterior Lower Leg o BellO. Antibiotics - Doxycycline o Other: - Vitamin C, Zinc, Multivitamin Bodenheimer, [name] ([id number]) Patient Medications Allergies: codeine, lisinopril, sea food Notifications Medication Indication Start End doxycycline hyclate 01/23/2016 DOSE 1 - oral 100 mg capsule -  1 capsule oral bid Electronic Signature(s) Signed: 01/23/2016 3:54:10 PM By: Christin Fudge MD, FACS Signed: 01/23/2016 5:33:09 PM By: Alric Quan Previous Signature: 01/23/2016 1:19:14 PM Version By: Christin Fudge MD, FACS Entered By: Alric Quan on 01/23/2016 13:35:09 Pherigo, Phong P. ([id number]) -------------------------------------------------------------------------------- Problem List Details Patient Name: [name], [name] Date of Service: 01/23/2016 12:45 PM Medical Record Number: [id number] Patient Account Number: [id number] Date of Birth/Sex: Jul 24, 1965 (51 y.o. Male) Treating RN: Carolyne Fiscal, Debi Primary Care Physician: PATIENT, NO Other Clinician: Referring Physician: Loletha Grayer Treating Physician/Extender: Frann Rider in Treatment: 1 Active Problems ICD-10 Encounter Code Description Active Date Diagnosis I89.0 Lymphedema, not elsewhere classified 01/16/2016 Yes L97.222 Non-pressure chronic ulcer of left calf with fat layer 01/16/2016 Yes exposed E66.01 Morbid (severe) obesity due to excess calories 01/16/2016 Yes F17.218 Nicotine dependence, cigarettes, with other nicotine- 01/16/2016 Yes induced disorders L03.116 Cellulitis of left lower limb 01/23/2016 Yes Inactive Problems Resolved Problems Electronic Signature(s) Signed: 01/23/2016 1:14:26 PM By: Christin Fudge MD, FACS Entered By: Christin Fudge on 01/23/2016 13:14:26 Portell, [name]  ([id number]) -------------------------------------------------------------------------------- Progress Note Details Patient Name: [name], [name] Date of Service: 01/23/2016 12:45 PM Medical Record Number: [id number] Patient Account Number: [id number] Date of Birth/Sex: [birth year] (51 y.o. Male) Treating RN: Carolyne Fiscal, Debi Primary Care Physician: PATIENT, NO Other Clinician: Referring Physician: Loletha Grayer Treating Physician/Extender: Frann Rider in Treatment: 1 Subjective Chief Complaint Information obtained from Patient Patient presents to the wound care center for a consult due non healing wound for bilateral lower extremity edema which she's had for several months History of Present Illness (HPI) The following HPI elements were documented for the patient's wound: Location: a lateral lower extremity edema with swelling and infection of the left lower extremity Quality: Patient reports experiencing a sharp pain to affected area(s). Severity: Patient states wound are getting worse. Duration: Patient has had the wound for > 3 months prior to seeking treatment at the wound center Timing: Pain in wound is constant (hurts all the time) Context: The wound appeared gradually over time Modifying Factors: Consults to this date include:was admitted to the hospital for IV antibiotics and treatment Associated Signs and Symptoms: Patient reports having increase discharge. 51 year old gentleman who is a current every day smoker and smokes a pack of cigarettes a day for the last 30 years also is known to have had a recent admission to the hospital between December 25 and 01/09/2016 and was admitted for swelling and cellulitis of the left lower extremity. he has a past medical history of COPD, coronary artery disease, hypertension, peptic ulcer and sleep apnea. His admission he was known to have cellulitis the left lower extremity and bilateral lower extremity lymphedema, and was  placed on aggressive antibiotics( vancomycin and Zosyn) and Unna boot and change to Ancef later. He was also treated for COPD, essential hypertension and nicotine abuse. An echo done and showed a normal ejection fraction and was started on low-dose Lasix. he was discharged home on Keflex. during the admission of a duplex study was done for possible DVT and there was no evidence of this found in the left lower extremity. 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 In the process of finding a PCP here. 01/23/2016 -- his vascular workup is later this month and his PCP appointment is not too early February. Objective [name], [name] ([id number]) Constitutional Pulse regular. Respirations normal and unlabored. Afebrile. Vitals Time Taken: 12:46 PM, Height: 69  in, Weight: 316 lbs, BMI: 46.7, Temperature: 97.8 F, Pulse: 98 bpm, Respiratory Rate: 18 breaths/min, Blood Pressure: 160/82 mmHg. Eyes Nonicteric. Reactive to light. Ears, Nose, Mouth, and Throat Lips, teeth, and gums WNL.Marland Kitchen Moist mucosa without lesions. Neck supple and nontender. No palpable supraclavicular or cervical adenopathy. Normal sized without goiter. Respiratory WNL. No retractions.. Breath sounds WNL, No rubs, rales, rhonchi, or wheeze.. Cardiovascular Heart rhythm and rate regular, no murmur or gallop.. Pedal Pulses WNL. No clubbing, cyanosis or edema. Chest Breasts symmetical and no nipple discharge.. Breast tissue WNL, no masses, lumps, or tenderness.. Lymphatic No adneopathy. No adenopathy. No adenopathy. Musculoskeletal Adexa without tenderness or enlargement.. Digits and nails w/o clubbing, cyanosis, infection, petechiae, ischemia, or inflammatory conditions.Marland Kitchen Psychiatric Judgement and insight Intact.. No evidence of depression, anxiety, or agitation.. General Notes: The left lower extremity continues to have significant amount of debris around the  lower anterior lateral part, but the weeping and lymphedema is less. He has mild cellulitis and I will continue to treat him with local care and oral antibiotics Integumentary (Hair, Skin) No suspicious lesions. No crepitus or fluctuance. No peri-wound warmth or erythema. No masses.. Wound #1 status is Open. Original cause of wound was Gradually Appeared. The wound is located on the Left,Anterior Lower Leg. The wound measures 4cm length x 5cm width x 0.1cm depth; 15.708cm^2 area and 1.571cm^3 volume. The wound is limited to skin breakdown. There is no tunneling or undermining noted. There is a large amount of serous drainage noted. The wound margin is distinct with the outline attached to the wound base. There is no granulation within the wound bed. There is a large (67-100%) amount of necrotic tissue within the wound bed including Adherent Slough. The periwound skin appearance exhibited: Localized Edema, Maceration, Moist, Erythema. The surrounding wound skin color is noted with [name], [name] ([id number]) erythema which is circumferential. Periwound temperature was noted as No Abnormality. The periwound has tenderness on palpation. Wound #2 status is Open. Original cause of wound was Gradually Appeared. The wound is located on the Left,Posterior Lower Leg. The wound measures 9.5cm length x 12cm width x 0.1cm depth; 89.535cm^2 area and 8.954cm^3 volume. The wound is limited to skin breakdown. There is no tunneling or undermining noted. There is a large amount of serous drainage noted. The wound margin is distinct with the outline attached to the wound base. There is no granulation within the wound bed. There is a large (67-100%) amount of necrotic tissue within the wound bed including Adherent Slough. The periwound skin appearance exhibited: Localized Edema, Maceration, Moist, Erythema. The surrounding wound skin color is noted with erythema which is circumferential. Periwound temperature  was noted as No Abnormality. The periwound has tenderness on palpation. Assessment Active Problems ICD-10 I89.0 - Lymphedema, not elsewhere classified L97.222 - Non-pressure chronic ulcer of left calf with fat layer exposed E66.01 - Morbid (severe) obesity due to excess calories F17.218 - Nicotine dependence, cigarettes, with other nicotine-induced disorders L03.116 - Cellulitis of left lower limb Plan Wound Cleansing: Wound #1 Left,Anterior Lower Leg: Clean wound with Normal Saline. Cleanse wound with mild soap and water - HHRN to clean with soap and water when changing wraps. Wound #2 Left,Posterior Lower Leg: Clean wound with Normal Saline. Cleanse wound with mild soap and water - HHRN to clean with soap and water when changing wraps. Anesthetic: Wound #1 Left,Anterior Lower Leg: Topical Lidocaine 4% cream applied to wound bed prior to debridement - for clinic use Wound #2 Left,Posterior Lower Leg: Topical Lidocaine  4% cream applied to wound bed prior to debridement - for clinic use Primary Wound Dressing: Wound #1 Left,Anterior Lower Leg: Aquacel Ag Wound #2 Left,Posterior Lower Leg: [name], [name]. ([id number]) Aquacel Ag Secondary Dressing: Wound #1 Left,Anterior Lower Leg: ABD pad XtraSorb Wound #2 Left,Posterior Lower Leg: ABD pad XtraSorb Dressing Change Frequency: Wound #1 Left,Anterior Lower Leg: Three times weekly - HHRN to change Mondays and Wednesdays. Pt will come to wound care clinic on Fridays to have it changed there. Wound #2 Left,Posterior Lower Leg: Three times weekly - HHRN to change Mondays and Wednesdays. Pt will come to wound care clinic on Fridays to have it changed there. Follow-up Appointments: Wound #1 Left,Anterior Lower Leg: Return Appointment in 1 week. Wound #2 Left,Posterior Lower Leg: Return Appointment in 1 week. Edema Control: Wound #1 Left,Anterior Lower Leg: 3 Layer Compression System - Left Lower Extremity - unna to  anchor Elevate legs to the level of the heart and pump ankles as often as possible Wound #2 Left,Posterior Lower Leg: 3 Layer Compression System - Left Lower Extremity - unna to anchor Elevate legs to the level of the heart and pump ankles as often as possible Additional Orders / Instructions: Wound #1 Left,Anterior Lower Leg: Stop Smoking Increase protein intake. Wound #2 Left,Posterior Lower Leg: Stop Smoking Increase protein intake. Home Health: Wound #1 Left,Anterior Lower Leg: Continue Home Health Visits - [facility] to change Mondays and Wednesdays. Pt will come to wound care clinic on Fridays to have it changed there. Home Health Nurse may visit PRN to address patient s wound care needs. FACE TO FACE ENCOUNTER: MEDICARE and MEDICAID PATIENTS: I certify that this patient is under my care and that I had a face-to-face encounter that meets the physician face-to-face encounter requirements with this patient on this date. The encounter with the patient was in whole or in part for the following MEDICAL CONDITION: (primary reason for [place]) MEDICAL NECESSITY: I certify, that based on my findings, NURSING services are a medically necessary home health service. HOME BOUND STATUS: I certify that my clinical findings support that this patient is homebound (i.e., Due to illness or injury, pt requires aid of supportive devices such as crutches, cane, wheelchairs, walkers, the use of special transportation or the assistance of another person to leave their place of residence. There is a normal inability to leave the home and doing so requires considerable and taxing effort. Other absences are for medical reasons / religious services and are infrequent or of short duration when for other reasons). If current dressing causes regression in wound condition, may D/C ordered dressing product/s and apply Normal Saline Moist Dressing daily until next [place] / Other MD appointment.  Notify Wound Vollrath, Rorey P. ([id number]) [place] of regression in wound condition at [phone]. Please direct any NON-WOUND related issues/requests for orders to patient's Primary Care Physician Wound #2 Left,Posterior Lower Leg: [place] Visits - [facility] to change Mondays and Wednesdays. Pt will come to wound care clinic on Fridays to have it changed there. Home Health Nurse may visit PRN to address patient s wound care needs. FACE TO FACE ENCOUNTER: MEDICARE and MEDICAID PATIENTS: I certify that this patient is under my care and that I had a face-to-face encounter that meets the physician face-to-face encounter requirements with this patient on this date. The encounter with the patient was in whole or in part for the following MEDICAL CONDITION: (primary reason for [place]) MEDICAL NECESSITY: I certify, that based on  my findings, NURSING services are a medically necessary home health service. HOME BOUND STATUS: I certify that my clinical findings support that this patient is homebound (i.e., Due to illness or injury, pt requires aid of supportive devices such as crutches, cane, wheelchairs, walkers, the use of special transportation or the assistance of another person to leave their place of residence. There is a normal inability to leave the home and doing so requires considerable and taxing effort. Other absences are for medical reasons / religious services and are infrequent or of short duration when for other reasons). If current dressing causes regression in wound condition, may D/C ordered dressing product/s and apply Normal Saline Moist Dressing daily until next [facility] / Other MD appointment. [place] of regression in wound condition at [phone]. Please direct any NON-WOUND related issues/requests for orders to patient's Primary Care Physician Medications-please add to medication list.: Wound #1 Left,Anterior Lower  Leg: BellO. Antibiotics - Doxycycline Other: - Vitamin C, Zinc, Multivitamin Wound #2 Left,Posterior Lower Leg: BellO. Antibiotics - Doxycycline Other: - Vitamin C, Zinc, Multivitamin The following medication(s) was prescribed: doxycycline hyclate oral 100 mg capsule 1 1 capsule oral bid starting 01/23/2016 His antibiotic course is completed and he doesn't have his PCP appointment yet and hence I will prolong his course with some doxycycline. After review I have recommended: 1. Silver alginate, DrawTex and a 3 layer Profore wrap to the left lower extremity. 2. arterial duplex and venous duplex studies for reflux both lower extremities -- appointment pending 3. If he is going to be following up here he needs to find a PCP to help control his medical issues -- appointment in first week of February 4. Doxycycline 100 mg twice a day for 14 days. If he develops symptomatic cellulitis with signs of sepsis he should report to the ER immediately 5. I have spent over 3 minutes discussing with him the need to completely give up smoking have discussed the risks benefits and alternatives and all the possible complications 6. Regular visits to the wound center JOSEFRANCISCO, SHERROW ([id number]) Electronic Signature(s) Signed: 01/23/2016 3:57:04 PM By: Christin Fudge MD, FACS Previous Signature: 01/23/2016 1:20:45 PM Version By: Christin Fudge MD, FACS Entered By: Christin Fudge on 01/23/2016 15:57:04 Pricer, Kejuan P. ([id number]) -------------------------------------------------------------------------------- SuperBill Details Patient Name: Olmeda, Emmette P. Date of Service: 01/23/2016 Medical Record Number: [id number] Patient Account Number: [id number] Date of Birth/Sex: 1965-03-06 (51 y.o. Male) Treating RN: Carolyne Fiscal, Debi Primary Care Physician: PATIENT, NO Other Clinician: Referring Physician: Loletha Grayer Treating Physician/Extender: Frann Rider in Treatment: 1 Diagnosis Coding ICD-10  Codes Code Description I89.0 Lymphedema, not elsewhere classified L97.222 Non-pressure chronic ulcer of left calf with fat layer exposed E66.01 Morbid (severe) obesity due to excess calories F17.218 Nicotine dependence, cigarettes, with other nicotine-induced disorders L03.116 Cellulitis of left lower limb Facility Procedures CPT4 Code: [id number] Description: 99214 - WOUND CARE VISIT-LEV 4 EST PT Modifier: Quantity: 1 Physician Procedures CPT4 Code: [id number] Description: [id number] - WC PHYS LEVEL 3 - EST PT ICD-10 Description Diagnosis I89.0 Lymphedema, not elsewhere classified 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

## 2016-01-24 NOTE — Progress Notes (Signed)
BURTON, GAHAN ([id number]) Visit Report for 01/23/2016 Arrival Information Details Patient Name: [name], [name]. Date of Service: 01/23/2016 12:45 PM Medical Record Number: [id number] Patient Account Number: [id number] Date of Birth/Sex: February 08, 1965 (51 y.o. Male) Treating RN: Carolyne Fiscal, Debi Primary Care Physician: PATIENT, NO Other Clinician: Referring Physician: Loletha Grayer Treating Physician/Extender: Frann Rider in Treatment: 1 Visit Information History Since Last Visit All ordered tests and consults were completed: No Patient Arrived: Ambulatory Added or deleted any medications: No Arrival Time: 12:44 Any new allergies or adverse reactions: No Accompanied By: sister Had a fall or experienced change in No Transfer Assistance: None activities of daily living that may affect Patient Identification Verified: Yes risk of falls: Secondary Verification Process Yes Signs or symptoms of abuse/neglect since last No Completed: visito Patient Requires Transmission-Based No Hospitalized since last visit: No Precautions: Pain Present Now: Yes Patient Has Alerts: Yes Patient Alerts: ASA Electronic Signature(s) Signed: 01/23/2016 5:33:09 PM By: Alric Quan Entered By: Alric Quan on 01/23/2016 12:45:47 Herrman, Emile P. ([id number]) -------------------------------------------------------------------------------- Clinic Level of Care Assessment Details Patient Name: [name], [name] Date of Service: 01/23/2016 12:45 PM Medical Record Number: [id number] Patient Account Number: [id number] Date of Birth/Sex: 1965/11/21 (51 y.o. Male) Treating RN: Carolyne Fiscal, Debi Primary Care Physician: PATIENT, NO Other Clinician: Referring Physician: Loletha Grayer Treating Physician/Extender: Frann Rider in Treatment: 1 Clinic Level of Care Assessment Items TOOL 4 Quantity Score X - Use when only an EandM is performed on FOLLOW-UP visit 1 0 ASSESSMENTS -  Nursing Assessment / Reassessment X - Reassessment of Co-morbidities (includes updates in patient status) 1 10 X - Reassessment of Adherence to Treatment Plan 1 5 ASSESSMENTS - Wound and Skin Assessment / Reassessment _0  - Simple Wound Assessment / Reassessment - one wound 0 X - Complex Wound Assessment / Reassessment - multiple wounds 2 5 _1  - Dermatologic / Skin Assessment (not related to wound area) 0 ASSESSMENTS - Focused Assessment X - Circumferential Edema Measurements - multi extremities 1 5 _2  - Nutritional Assessment / Counseling / Intervention 0 _3  - Lower Extremity Assessment (monofilament, tuning fork, pulses) 0 _4  - Peripheral Arterial Disease Assessment (using hand held doppler) 0 ASSESSMENTS - Ostomy and/or Continence Assessment and Care _5  - Incontinence Assessment and Management 0 _6  - Ostomy Care Assessment and Management (repouching, etc.) 0 PROCESS - Coordination of Care _7  - Simple Patient / Family Education for ongoing care 0 X - Complex (extensive) Patient / Family Education for ongoing care 1 20 X - Staff obtains Programmer, systems, Records, Test Results / Process Orders 1 10 X - Staff telephones HHA, Nursing Homes / Clarify orders / etc 1 10 _8  - Routine Transfer to another Facility (non-emergent condition) 0 [name], [name] ([id number]) _9  - Routine Hospital Admission (non-emergent condition) 0 _10  - New Admissions / Biomedical engineer / Ordering NPWT, Apligraf, etc. 0 _11  - Emergency Hospital Admission (emergent condition) 0 X - Simple Discharge Coordination 1 10 _12  - Complex (extensive) Discharge Coordination 0 PROCESS - Special Needs _13  - Pediatric / Minor Patient Management 0 _14  - Isolation Patient Management 0 _15  - Hearing / Language / Visual special needs 0 _16  - Assessment of Community assistance (transportation, D/C planning, etc.) 0 _17  - Additional assistance / Altered mentation 0 _18  - Support Surface(s) Assessment (bed, cushion, seat, etc.)  0 INTERVENTIONS - Wound Cleansing / Measurement _19  - Simple Wound Cleansing - one wound 0 X - Complex Wound Cleansing - multiple wounds 2 5 X - Wound Imaging (photographs - any  number of wounds) 1 5 _0  - Wound Tracing (instead of photographs) 0 _1  - Simple Wound Measurement - one wound 0 X - Complex Wound Measurement - multiple wounds 2 5 INTERVENTIONS - Wound Dressings _2  - Small Wound Dressing one or multiple wounds 0 _3  - Medium Wound Dressing one or multiple wounds 0 X - Large Wound Dressing one or multiple wounds 1 20 X - Application of Medications - topical 1 5 <[id number]>_6<\/SAYTKZSWFUXNATFT>_7  - Application of Medications - injection 0 INTERVENTIONS - Miscellaneous _5  - External ear exam 0 Linnemann, Kiegan P. ([id number]) _6  - Specimen Collection (cultures, biopsies, blood, body fluids, etc.) 0 _7  - Specimen(s) / Culture(s) sent or taken to Lab for analysis 0 _8  - Patient Transfer (multiple staff / Harrel Lemon Lift / Similar devices) 0 _9  - Simple Staple / Suture removal (25 or less) 0 _10  - Complex Staple / Suture removal (26 or more) 0 _11  - Hypo / Hyperglycemic Management (close monitor of Blood Glucose) 0 _12  - Ankle / Brachial Index (ABI) - do not check if billed separately 0 X - Vital Signs 1 5 Has the patient been seen at the hospital within the last three years: Yes Total Score: 135 Level Of Care: New/Established - Level 4 Electronic Signature(s) Signed: 01/23/2016 5:33:09 PM By: Alric Quan Entered By: Alric Quan on 01/23/2016 15:54:40 Crompton, Kahmari P. ([id number]) -------------------------------------------------------------------------------- Encounter Discharge Information Details Patient Name: [name], [name] Date of Service: 01/23/2016 12:45 PM Medical Record Number: [id number] Patient Account Number: [id number] Date of Birth/Sex: [birth year] (51 y.o. Male) Treating RN: Carolyne Fiscal, Debi Primary Care Physician: PATIENT, NO Other Clinician: Referring Physician: Loletha Grayer Treating  Physician/Extender: Frann Rider in Treatment: 1 Encounter Discharge Information Items Discharge Pain Level: 7 Discharge Condition: Stable Ambulatory Status: Ambulatory Discharge Destination: Home Transportation: Private Auto Accompanied By: sister Schedule Follow-up Appointment: Yes Medication Reconciliation completed and provided to Patient/Care Yes Lindsay Straka: Provided on Clinical Summary of Care: 01/23/2016 Form Type Recipient Paper Patient RB Electronic Signature(s) Signed: 01/23/2016 5:33:09 PM By: Alric Quan Previous Signature: 01/23/2016 1:30:04 PM Version By: Ruthine Dose Entered By: Alric Quan on 01/23/2016 13:35:45 Tome, Erin P. ([id number]) -------------------------------------------------------------------------------- Lower Extremity Assessment Details Patient Name: [name], [name] Date of Service: 01/23/2016 12:45 PM Medical Record Number: [id number] Patient Account Number: [id number] Date of Birth/Sex: [birth year] (51 y.o. Male) Treating RN: Carolyne Fiscal, Debi Primary Care Physician: PATIENT, NO Other Clinician: Referring Physician: Loletha Grayer Treating Physician/Extender: Frann Rider in Treatment: 1 Edema Assessment Assessed: [Left: Yes] [Right: No] Edema: [Left: Ye] [Right: s] Calf Left: Right: Point of Measurement: 34 cm From Medial Instep 48.6 cm cm Ankle Left: Right: Point of Measurement: 11 cm From Medial Instep 28.7 cm cm Vascular Assessment Pulses: Dorsalis Pedis Palpable: [Left:Yes] Posterior Tibial Extremity colors, hair growth, and conditions: Extremity Color: [Left:Red] Temperature of Extremity: [Left:Warm] Capillary Refill: [Left:< 3 seconds] Toe Nail Assessment Left: Right: Thick: Yes Discolored: Yes Deformed: No Improper Length and Hygiene: Yes Electronic Signature(s) Signed: 01/23/2016 5:33:09 PM By: Alric Quan Entered By: Alric Quan on 01/23/2016 13:02:16 Dalgleish, Ciaran P.  ([id number]) -------------------------------------------------------------------------------- Multi Wound Chart Details Patient Name: [name], [name] Date of Service: 01/23/2016 12:45 PM Medical Record Number: [id number] Patient Account Number: [id number] Date of Birth/Sex: [birth year] (51 y.o. Male) Treating RN: Carolyne Fiscal, Debi Primary Care Physician: PATIENT, NO Other Clinician: Referring Physician: Loletha Grayer Treating Physician/Extender: Frann Rider in Treatment: 1 Vital Signs Height(in): 69 Pulse(bpm): 98 Weight(lbs): 316 Blood Pressure 160/82 (mmHg): Body Mass Index(BMI): 47 Temperature(F): 97.8 Respiratory Rate 18 (breaths/min):  Photos: [1:No Photos] [2:No Photos] [N/A:N/A] Wound Location: [1:Left Lower Leg - Anterior] [2:Left Lower Leg - Posterior] [N/A:N/A] Wounding Event: [1:Gradually Appeared] [2:Gradually Appeared] [N/A:N/A] Primary Etiology: [1:Venous Leg Ulcer] [2:Venous Leg Ulcer] [N/A:N/A] Comorbid History: [1:Chronic Obstructive Pulmonary Disease (COPD), Sleep Apnea, Congestive Heart Failure, Coronary Artery Disease, Seizure Disorder] [2:Chronic Obstructive Pulmonary Disease (COPD), Sleep Apnea, Congestive Heart Failure, Coronary Artery  Disease, Seizure Disorder] [N/A:N/A] Date Acquired: [1:12/26/2015] [2:12/26/2015] [N/A:N/A] Weeks of Treatment: [1:1] [2:1] [N/A:N/A] Wound Status: [1:Open] [2:Open] [N/A:N/A] Measurements L x W x D 4x5x0.1 [2:9.5x12x0.1] [N/A:N/A] (cm) Area (cm) : [1:15.708] [2:89.535] [N/A:N/A] Volume (cm) : [1:1.571] [1:9.758] [N/A:N/A] % Reduction in Area: [1:39.40%] [2:-1.50%] [N/A:N/A] % Reduction in Volume: 39.40% [2:-1.50%] [N/A:N/A] Classification: [1:Partial Thickness] [2:Partial Thickness] [N/A:N/A] Exudate Amount: [1:Large] [2:Large] [N/A:N/A] Exudate Type: [1:Serous] [2:Serous] [N/A:N/A] Exudate Color: [1:amber] [2:amber] [N/A:N/A] Wound Margin: [1:Distinct, outline attached] [2:Distinct, outline attached]  [N/A:N/A] Granulation Amount: [1:None Present (0%)] [2:None Present (0%)] [N/A:N/A] Necrotic Amount: [1:Large (67-100%)] [2:Large (67-100%)] [N/A:N/A] Exposed Structures: [1:Fascia: No Fat: No Tendon: No Muscle: No] [2:Fascia: No Fat: No Tendon: No Muscle: No] [N/A:N/A] Joint: No Joint: No Bone: No Bone: No Limited to Skin Limited to Skin Breakdown Breakdown Epithelialization: None None N/A Periwound Skin Texture: Edema: Yes Edema: Yes N/A Periwound Skin Maceration: Yes Maceration: Yes N/A Moisture: Moist: Yes Moist: Yes Periwound Skin Color: Erythema: Yes Erythema: Yes N/A Erythema Location: Circumferential Circumferential N/A Temperature: No Abnormality No Abnormality N/A Tenderness on Yes Yes N/A Palpation: Wound Preparation: Ulcer Cleansing: Ulcer Cleansing: N/A Rinsed/Irrigated with Rinsed/Irrigated with Saline, Other: soap and Saline, Other: soap and water water Topical Anesthetic Topical Anesthetic Applied: Other: lidocaine Applied: Other: lidocaine 4% 4% Treatment Notes Electronic Signature(s) Signed: 01/23/2016 1:14:33 PM By: Christin Fudge MD, FACS Entered By: Christin Fudge on 01/23/2016 13:14:33 Mormile, Erinn Mamie Bell ([id number]) -------------------------------------------------------------------------------- Multi-Disciplinary Care Plan Details Patient Name: [name], [name] Date of Service: 01/23/2016 12:45 PM Medical Record Number: [id number] Patient Account Number: [id number] Date of Birth/Sex: 01/20/1965 (51 y.o. Male) Treating RN: Carolyne Fiscal, Debi Primary Care Physician: PATIENT, NO Other Clinician: Referring Physician: Loletha Grayer Treating Physician/Extender: Frann Rider in Treatment: 1 Active Inactive Abuse / Safety / Falls / Self Care Management Nursing Diagnoses: Potential for falls Goals: Patient will remain injury free Date Initiated: 01/16/2016 Goal Status: Active Interventions: Assess fall risk on admission and as needed Assess  self care needs on admission and as needed Notes: Nutrition Nursing Diagnoses: Imbalanced nutrition Potential for alteratiion in Nutrition/Potential for imbalanced nutrition Goals: Patient/caregiver agrees to and verbalizes understanding of need to use nutritional supplements and/or vitamins as prescribed Date Initiated: 01/16/2016 Goal Status: Active Interventions: Assess patient nutrition upon admission and as needed per policy Notes: Orientation to the Wound Care Program Nursing Diagnoses: Knowledge deficit related to the wound healing center program [name], [name] ([id number]) Goals: Patient/caregiver will verbalize understanding of the [facility] Date Initiated: 01/16/2016 Goal Status: Active Interventions: Provide education on orientation to the wound center Notes: Pain, Acute or Chronic Nursing Diagnoses: Pain, acute or chronic: actual or potential Potential alteration in comfort, pain Goals: Patient will verbalize adequate pain control and receive pain control interventions during procedures as needed Date Initiated: 01/16/2016 Goal Status: Active Patient/caregiver will verbalize adequate pain control between visits Date Initiated: 01/16/2016 Goal Status: Active Patient/caregiver will verbalize comfort level met Date Initiated: 01/16/2016 Goal Status: Active Interventions: Assess comfort goal upon admission Complete pain assessment as per visit requirements Notes: Venous Leg Ulcer Nursing Diagnoses: Actual venous Insuffiency (use after diagnosis is confirmed)  Knowledge deficit related to disease process and management Potential for venous Insuffiency (use before diagnosis confirmed) Goals: Patient will maintain optimal edema control Date Initiated: 01/16/2016 Goal Status: Active Patient/caregiver will verbalize understanding of disease process and disease management [name], [name] ([id number]) Date Initiated: 01/16/2016 Goal Status:  Active Interventions: Assess peripheral edema status every visit. Provide education on venous insufficiency Notes: Wound/Skin Impairment Nursing Diagnoses: Impaired tissue integrity Knowledge deficit related to smoking impact on wound healing Knowledge deficit related to ulceration/compromised skin integrity Goals: Ulcer/skin breakdown will have a volume reduction of 30% by week 4 Date Initiated: 01/16/2016 Goal Status: Active Ulcer/skin breakdown will have a volume reduction of 50% by week 8 Date Initiated: 01/16/2016 Goal Status: Active Ulcer/skin breakdown will have a volume reduction of 80% by week 12 Date Initiated: 01/16/2016 Goal Status: Active Interventions: Assess patient/caregiver ability to perform ulcer/skin care regimen upon admission and as needed Assess ulceration(s) every visit Provide education on smoking Provide education on ulcer and skin care Notes: Electronic Signature(s) Signed: 01/23/2016 5:33:09 PM By: Alric Quan Entered By: Alric Quan on 01/23/2016 13:10:55 [name], [name] ([id number]) -------------------------------------------------------------------------------- Pain Assessment Details Patient Name: [name], [name] Date of Service: 01/23/2016 12:45 PM Medical Record Number: [id number] Patient Account Number: [id number] Date of Birth/Sex: 1965/10/07 (51 y.o. Male) Treating RN: Carolyne Fiscal, Debi Primary Care Physician: PATIENT, NO Other Clinician: Referring Physician: Loletha Grayer Treating Physician/Extender: Frann Rider in Treatment: 1 Active Problems Location of Pain Severity and Description of Pain Patient Has Paino Yes Site Locations Pain Location: Pain in Ulcers With Dressing Change: Yes Duration of the Pain. Constant / Intermittento Constant Rate the pain. Current Pain Level: 9 Worst Pain Level: 10 Least Pain Level: 6 Character of Pain Describe the Pain: Aching, Burning Pain Management and Medication Current  Pain Management: Electronic Signature(s) Signed: 01/23/2016 5:33:09 PM By: Alric Quan Entered By: Alric Quan on 01/23/2016 12:46:11 [name], [name] ([id number]) -------------------------------------------------------------------------------- Patient/Caregiver Education Details Patient Name: Hidrogo, Malcomb P. Date of Service: 01/23/2016 12:45 PM Medical Record Number: [id number] Patient Account Number: [id number] Date of Birth/Gender: 10/15/1965 (51 y.o. Male) Treating RN: Carolyne Fiscal, Debi Primary Care Physician: PATIENT, NO Other Clinician: Referring Physician: Loletha Grayer Treating Physician/Extender: Frann Rider in Treatment: 1 Education Assessment Education Provided To: Patient Education Topics Provided Wound/Skin Impairment: Handouts: Other: change dressing as ordered, do not get wrap wet Methods: Demonstration, Explain/Verbal Responses: State content correctly Electronic Signature(s) Signed: 01/23/2016 5:33:09 PM By: Alric Quan Entered By: Alric Quan on 01/23/2016 13:36:03 [name], Tahjae P. ([id number]) -------------------------------------------------------------------------------- Wound Assessment Details Patient Name: [place], Grier P. Date of Service: 01/23/2016 12:45 PM Medical Record Number: [id number] Patient Account Number: [id number] Date of Birth/Sex: January 22, 1965 (51 y.o. Male) Treating RN: Carolyne Fiscal, Debi Primary Care Physician: PATIENT, NO Other Clinician: Referring Physician: Loletha Grayer Treating Physician/Extender: Frann Rider in Treatment: 1 Wound Status Wound Number: 1 Primary Venous Leg Ulcer Etiology: Wound Location: Left Lower Leg - Anterior Wound Open Wounding Event: Gradually Appeared Status: Date Acquired: 12/26/2015 Comorbid Chronic Obstructive Pulmonary Disease Weeks Of Treatment: 1 History: (COPD), Sleep Apnea, Congestive Clustered Wound: No Heart Failure, Coronary Artery Disease, Seizure  Disorder Photos Photo Uploaded By: Alric Quan on 01/23/2016 15:57:50 Wound Measurements Length: (cm) 4 Width: (cm) 5 Depth: (cm) 0.1 Area: (cm) 15.708 Volume: (cm) 1.571 % Reduction in Area: 39.4% % Reduction in Volume: 39.4% Epithelialization: None Tunneling: No Undermining: No Wound Description Classification: Partial Thickness Wound Margin: Distinct, outline attached Exudate Amount: Large Exudate Type: Serous Exudate Color: amber Foul Odor After Cleansing:  No Wound Bed Granulation Amount: None Present (0%) Exposed Structure Necrotic Amount: Large (67-100%) Fascia Exposed: No Necrotic Quality: Adherent Slough Fat Layer Exposed: No Sabala, Adnan P. ([id number]) Tendon Exposed: No Muscle Exposed: No Joint Exposed: No Bone Exposed: No Limited to Skin Breakdown Periwound Skin Texture Texture Color No Abnormalities Noted: No No Abnormalities Noted: No Localized Edema: Yes Erythema: Yes Erythema Location: Circumferential Moisture No Abnormalities Noted: No Temperature / Pain Maceration: Yes Temperature: No Abnormality Moist: Yes Tenderness on Palpation: Yes Wound Preparation Ulcer Cleansing: Rinsed/Irrigated with Saline, Other: soap and water, Topical Anesthetic Applied: Other: lidocaine 4%, Treatment Notes Wound #1 (Left, Anterior Lower Leg) 1. Cleansed with: Clean wound with Normal Saline Cleanse wound with antibacterial soap and water 2. Anesthetic Topical Lidocaine 4% cream to wound bed prior to debridement 4. Dressing Applied: Aquacel Ag 5. Secondary Dressing Applied ABD Pad 7. Secured with Tape 3 Layer Compression System - Left Lower Extremity Notes xtrasorb, unna to Engineer, production) Signed: 01/23/2016 5:33:09 PM By: Alric Quan Entered By: Alric Quan on 01/23/2016 12:58:17 Alicea, Darshawn P. ([id number]) -------------------------------------------------------------------------------- Wound Assessment  Details Patient Name: Basich, Esias P. Date of Service: 01/23/2016 12:45 PM Medical Record Number: [id number] Patient Account Number: [id number] Date of Birth/Sex: 1965/03/10 (51 y.o. Male) Treating RN: Carolyne Fiscal, Debi Primary Care Physician: PATIENT, NO Other Clinician: Referring Physician: Loletha Grayer Treating Physician/Extender: Frann Rider in Treatment: 1 Wound Status Wound Number: 2 Primary Venous Leg Ulcer Etiology: Wound Location: Left Lower Leg - Posterior Wound Open Wounding Event: Gradually Appeared Status: Date Acquired: 12/26/2015 Comorbid Chronic Obstructive Pulmonary Disease Weeks Of Treatment: 1 History: (COPD), Sleep Apnea, Congestive Clustered Wound: No Heart Failure, Coronary Artery Disease, Seizure Disorder Photos Photo Uploaded By: Alric Quan on 01/23/2016 15:57:51 Wound Measurements Length: (cm) 9.5 Width: (cm) 12 Depth: (cm) 0.1 Area: (cm) 89.535 Volume: (cm) 8.954 % Reduction in Area: -1.5% % Reduction in Volume: -1.5% Epithelialization: None Tunneling: No Undermining: No Wound Description Classification: Partial Thickness Wound Margin: Distinct, outline attached Exudate Amount: Large Exudate Type: Serous Exudate Color: amber Foul Odor After Cleansing: No Wound Bed Granulation Amount: None Present (0%) Exposed Structure Necrotic Amount: Large (67-100%) Fascia Exposed: No Necrotic Quality: Adherent Slough Fat Layer Exposed: No Wiley, Westly P. ([id number]) Tendon Exposed: No Muscle Exposed: No Joint Exposed: No Bone Exposed: No Limited to Skin Breakdown Periwound Skin Texture Texture Color No Abnormalities Noted: No No Abnormalities Noted: No Localized Edema: Yes Erythema: Yes Erythema Location: Circumferential Moisture No Abnormalities Noted: No Temperature / Pain Maceration: Yes Temperature: No Abnormality Moist: Yes Tenderness on Palpation: Yes Wound Preparation Ulcer Cleansing: Rinsed/Irrigated  with Saline, Other: soap and water, Topical Anesthetic Applied: Other: lidocaine 4%, Treatment Notes Wound #2 (Left, Posterior Lower Leg) 1. Cleansed with: Clean wound with Normal Saline Cleanse wound with antibacterial soap and water 2. Anesthetic Topical Lidocaine 4% cream to wound bed prior to debridement 4. Dressing Applied: Aquacel Ag 5. Secondary Dressing Applied ABD Pad 7. Secured with Tape 3 Layer Compression System - Left Lower Extremity Notes xtrasorb, unna to Engineer, production) Signed: 01/23/2016 5:33:09 PM By: Alric Quan Entered By: Alric Quan on 01/23/2016 12:59:13 Whisner, Shakeel P. ([id number]) -------------------------------------------------------------------------------- Vitals Details Patient Name: Hillery, Jaivon P. Date of Service: 01/23/2016 12:45 PM Medical Record Number: [id number] Patient Account Number: [id number] Date of Birth/Sex: 01/13/1965 (51 y.o. Male) Treating RN: Carolyne Fiscal, Debi Primary Care Physician: PATIENT, NO Other Clinician: Referring Physician: Loletha Grayer Treating Physician/Extender: Frann Rider in Treatment: 1 Vital Signs Time Taken: 12:46 Temperature (F):  97.8 Height (in): 69 Pulse (bpm): 98 Weight (lbs): 316 Respiratory Rate (breaths/min): 18 Body Mass Index (BMI): 46.7 Blood Pressure (mmHg): 160/82 Reference Range: 80 - 120 mg / dl Electronic Signature(s) Signed: 01/23/2016 5:33:09 PM By: Alric Quan Entered By: Alric Quan on 01/23/2016 12:49:19

## 2016-01-31 ENCOUNTER — Encounter: Payer: BLUE CROSS/BLUE SHIELD | Admitting: Surgery

## 2016-01-31 DIAGNOSIS — I890 Lymphedema, not elsewhere classified: Secondary | ICD-10-CM | POA: Diagnosis not present

## 2016-02-01 NOTE — Progress Notes (Signed)
[name], [name] ([id number]) Visit Report for 01/31/2016 Chief Complaint Document Details Patient Name: [name], [name] Date of Service: 01/31/2016 2:30 PM Medical Record Number: [id number] Patient Account Number: [id number] Date of Birth/Sex: Mar 30, 1965 (51 y.o. Male) Treating RN: Carolyne Fiscal, Debi Primary Care Physician: PATIENT, NO Other Clinician: Referring Physician: Loletha Grayer Treating Physician/Extender: Frann Rider in Treatment: 2 Information Obtained from: Patient Chief Complaint Patient presents to the wound care center for a consult due non healing wound for bilateral lower extremity edema which she's had for several months Electronic Signature(s) Signed: 01/31/2016 3:18:21 PM By: Christin Fudge MD, FACS Entered By: Christin Fudge on 01/31/2016 15:18:21 [name], [name] ([id number]) -------------------------------------------------------------------------------- HPI Details Patient Name: [name], [name] Date of Service: 01/31/2016 2:30 PM Medical Record Number: [id number] Patient Account Number: [id number] Date of Birth/Sex: 1965-01-28 (51 y.o. Male) Treating RN: Carolyne Fiscal, Debi Primary Care Physician: PATIENT, NO Other Clinician: Referring Physician: Loletha Grayer Treating Physician/Extender: Frann Rider in Treatment: 2 History of Present Illness Location: a lateral lower extremity edema with swelling and infection of the left lower extremity Quality: Patient reports experiencing a sharp pain to affected area(s). Severity: Patient states wound are getting worse. Duration: Patient has had the wound for > 3 months prior to seeking treatment at the wound center Timing: Pain in wound is constant (hurts all the time) Context: The wound appeared gradually over time Modifying Factors: Consults to this date include:was admitted to the hospital for IV antibiotics and treatment Associated Signs and Symptoms: Patient reports having increase  discharge. HPI Description: 51 year old gentleman who is a current every day smoker and smokes a pack of cigarettes a day for the last 30 years also is known to have had a recent admission to the hospital between December 25 and 01/09/2016 and was admitted for swelling and cellulitis of the left lower extremity. he has a past medical history of COPD, coronary artery disease, hypertension, peptic ulcer and sleep apnea. His admission he was known to have cellulitis the left lower extremity and bilateral lower extremity lymphedema, and was placed on aggressive antibiotics( vancomycin and Zosyn) and Unna boot and change to Ancef later. He was also treated for COPD, essential hypertension and nicotine abuse. An echo done and showed a normal ejection fraction and was started on low-dose Lasix. he was discharged home on Keflex. during the admission of a duplex study was done for possible DVT and there was no evidence of this found in the left lower extremity. 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 In the process of finding a PCP here. 01/23/2016 -- his vascular workup is later this month and his PCP appointment is not too early February. Electronic Signature(s) Signed: 01/31/2016 3:18:30 PM By: Christin Fudge MD, FACS Entered By: Christin Fudge on 01/31/2016 15:18:28 [name], [name] ([id number]) -------------------------------------------------------------------------------- Physical Exam Details Patient Name: [name], [name] Date of Service: 01/31/2016 2:30 PM Medical Record Number: [id number] Patient Account Number: [id number] Date of Birth/Sex: 03-Apr-1965 (51 y.o. Male) Treating RN: Carolyne Fiscal, Debi Primary Care Physician: PATIENT, NO Other Clinician: Referring Physician: Loletha Grayer Treating Physician/Extender: Frann Rider in Treatment: 2 Constitutional . Pulse regular. Respirations normal and unlabored. Afebrile.  . Eyes Nonicteric. Reactive to light. Ears, Nose, Mouth, and Throat Lips, teeth, and gums WNL.Marland Kitchen Moist mucosa without lesions. Neck supple and nontender. No palpable supraclavicular or cervical adenopathy. Normal sized without goiter. Respiratory WNL. No retractions.. Cardiovascular Pedal Pulses WNL. No clubbing, cyanosis  or edema. Lymphatic No adneopathy. No adenopathy. No adenopathy. Musculoskeletal Adexa without tenderness or enlargement.. Digits and nails w/o clubbing, cyanosis, infection, petechiae, ischemia, or inflammatory conditions.. Integumentary (Hair, Skin) No suspicious lesions. No crepitus or fluctuance. No peri-wound warmth or erythema. No masses.Marland Kitchen Psychiatric Judgement and insight Intact.. No evidence of depression, anxiety, or agitation.. Notes the lymphedema and the weeping from the posterior part of his wound continues but overall there has been improvement. Electronic Signature(s) Signed: 01/31/2016 3:19:06 PM By: Christin Fudge MD, FACS Entered By: Christin Fudge on 01/31/2016 15:19:06 [name], [name] ([id number]) -------------------------------------------------------------------------------- Physician Orders Details Patient Name: [name], [name] Date of Service: 01/31/2016 2:30 PM Medical Record Number: [id number] Patient Account Number: [id number] Date of Birth/Sex: 08-Aug-1965 (51 y.o. Male) Treating RN: Cornell Barman Primary Care Physician: PATIENT, NO Other Clinician: Referring Physician: Loletha Grayer Treating Physician/Extender: Frann Rider in Treatment: 2 Verbal / Phone Orders: No Diagnosis Coding Wound Cleansing Wound #1 Left,Anterior Lower Leg o Clean wound with Normal Saline. o Cleanse wound with mild soap and water - HHRN to clean with soap and water when changing wraps. Wound #2 Left,Posterior Lower Leg o Clean wound with Normal Saline. o Cleanse wound with mild soap and water - HHRN to clean with soap and water when  changing wraps. Primary Wound Dressing Wound #1 Left,Anterior Lower Leg o Aquacel Ag - or Equal Wound #2 Left,Posterior Lower Leg o Aquacel Ag - or Equal Secondary Dressing Wound #1 Left,Anterior Lower Leg o ABD pad Wound #2 Left,Posterior Lower Leg o ABD pad Dressing Change Frequency Wound #1 Left,Anterior Lower Leg o Three times weekly - HHRN to change Mondays and Wednesdays. Pt will come to wound care clinic on Fridays to have it changed there. Wound #2 Left,Posterior Lower Leg o Three times weekly - HHRN to change Mondays and Wednesdays. Pt will come to wound care clinic on Fridays to have it changed there. Follow-up Appointments Wound #1 Left,Anterior Lower Leg [place], Alejandro P. ([id number]) o Return Appointment in 1 week. Wound #2 Left,Posterior Lower Leg o Return Appointment in 1 week. Edema Control Wound #1 Left,Anterior Lower Leg o 3 Layer Compression System - Left Lower Extremity - Wrap from base of toes to 3cm below knee. o Elevate legs to the level of the heart and pump ankles as often as possible Wound #2 Left,Posterior Lower Leg o 3 Layer Compression System - Left Lower Extremity - Wrap from base of toes to 3cm below knee. o Elevate legs to the level of the heart and pump ankles as often as possible Additional Orders / Instructions Wound #1 Left,Anterior Lower Leg o Stop Smoking o Increase protein intake. Wound #2 Left,Posterior Lower Leg o Stop Smoking o Increase protein intake. Home Health Wound #1 [place] Visits - [facility] to change Mondays and Wednesdays. Pt will come to wound care clinic on Fridays to have it changed there. o Home Health Nurse may visit PRN to address patientos wound care needs. o FACE TO FACE ENCOUNTER: MEDICARE and MEDICAID PATIENTS: I certify that this patient is under my care and that I had a face-to-face encounter that meets the physician  face-to-face encounter requirements with this patient on this date. The encounter with the patient was in whole or in part for the following MEDICAL CONDITION: (primary reason for [place]) MEDICAL NECESSITY: I certify, that based on my findings, NURSING services are a medically necessary home health service. HOME BOUND STATUS: I certify that my clinical findings support that  this patient is homebound (i.e., Due to illness or injury, pt requires aid of supportive devices such as crutches, cane, wheelchairs, walkers, the use of special transportation or the assistance of another person to leave their place of residence. There is a normal inability to leave the home and doing so requires considerable and taxing effort. Other absences are for medical reasons / religious services and are infrequent or of short duration when for other reasons). o If current dressing causes regression in wound condition, may D/C ordered dressing product/s and apply Normal Saline Moist Dressing daily until next [place] / Other MD appointment. [place] of regression in wound condition at [phone]. o Please direct any NON-WOUND related issues/requests for orders to patient's Primary Care Physician Wound #2 Left,Posterior Lower Leg TEL, CATTON ([id number]) o [place] Visits - [facility] to change Mondays and Wednesdays. Pt will come to wound care clinic on Fridays to have it changed there. o Home Health Nurse may visit PRN to address patientos wound care needs. o FACE TO FACE ENCOUNTER: MEDICARE and MEDICAID PATIENTS: I certify that this patient is under my care and that I had a face-to-face encounter that meets the physician face-to-face encounter requirements with this patient on this date. The encounter with the patient was in whole or in part for the following MEDICAL CONDITION: (primary reason for [place]) MEDICAL NECESSITY: I certify, that  based on my findings, NURSING services are a medically necessary home health service. HOME BOUND STATUS: I certify that my clinical findings support that this patient is homebound (i.e., Due to illness or injury, pt requires aid of supportive devices such as crutches, cane, wheelchairs, walkers, the use of special transportation or the assistance of another person to leave their place of residence. There is a normal inability to leave the home and doing so requires considerable and taxing effort. Other absences are for medical reasons / religious services and are infrequent or of short duration when for other reasons). o If current dressing causes regression in wound condition, may D/C ordered dressing product/s and apply Normal Saline Moist Dressing daily until next [place] / Other MD appointment. [place] of regression in wound condition at [phone]. o Please direct any NON-WOUND related issues/requests for orders to patient's Primary Care Physician Medications-please add to medication list. Wound #1 Left,Anterior Lower Leg o BellO. Antibiotics - Complete Doxycycline o Other: - Vitamin C, Zinc, Multivitamin Wound #2 Left,Posterior Lower Leg o BellO. Antibiotics - Complete Doxycycline o Other: - Vitamin C, Zinc, Multivitamin Electronic Signature(s) Signed: 01/31/2016 3:30:06 PM By: Christin Fudge MD, FACS Signed: 01/31/2016 4:17:53 PM By: Gretta Cool RN, BSN, Kim RN, BSN Entered By: Gretta Cool, RN, BSN, Kim on 01/31/2016 15:15:27 Fannin, Suhas PMarland Kitchen ([id number]) -------------------------------------------------------------------------------- Problem List Details Patient Name: Winfield, Deric P. Date of Service: 01/31/2016 2:30 PM Medical Record Number: [id number] Patient Account Number: [id number] Date of Birth/Sex: [birth year] (51 y.o. 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 Date of Service: 01/31/2016 2:30 PM Medical Record Number: [id number] Patient Account Number: [id number] Date of Birth/Sex: May 15, 1965 (51 y.o. Male) Treating RN: Carolyne Fiscal, Debi Primary Care Physician: PATIENT, NO Other Clinician: Referring Physician: Loletha Grayer Treating Physician/Extender: Frann Rider in Treatment: 2 Subjective Chief Complaint Information obtained from Patient Patient presents to the wound care center for a consult due non healing wound for bilateral lower extremity edema which she's had for several months History of Present Illness (HPI) The following HPI elements were documented for the patient's wound: Location: a lateral lower extremity edema with swelling and infection of the left lower extremity Quality: Patient reports experiencing a sharp pain to affected area(s). Severity: Patient states wound are getting worse. Duration: Patient has had the wound for > 3 months prior to seeking treatment at the wound center Timing: Pain in wound is constant (hurts all the time) Context: The wound appeared gradually over time Modifying  Factors: Consults to this date include:was admitted to the hospital for IV antibiotics and treatment Associated Signs and Symptoms: Patient reports having increase discharge. 51 year old gentleman who is a current every day smoker and smokes a pack of cigarettes a day for the last 30 years also is known to have had a recent admission to the hospital between December 25 and 01/09/2016 and was admitted for swelling and cellulitis of the left lower extremity. he has a past medical history of COPD, coronary artery disease, hypertension, peptic ulcer and sleep apnea. His admission he was known to have cellulitis the left lower extremity and bilateral lower extremity lymphedema, and was placed on aggressive antibiotics( vancomycin and Zosyn) and Unna boot and change to Ancef later. He was also treated for COPD, essential hypertension and nicotine abuse. An echo done and showed a normal ejection fraction and was started on low-dose Lasix. he was discharged home on Keflex. during the admission of a duplex study was done for possible DVT and there was no evidence of this found in the left lower extremity. 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 In the process of finding a PCP here. 01/23/2016 -- his vascular workup is later this month and his PCP appointment is not too early February. Objective [name], [name] ([id number]) Constitutional Pulse regular. Respirations normal and unlabored. Afebrile. Vitals Time Taken: 2:46 PM, Height: 69 in, Weight: 316 lbs, BMI: 46.7, Temperature: 97.9 F, Pulse: 98 bpm, Respiratory Rate: 16 breaths/min, Blood Pressure: 128/83 mmHg. Eyes Nonicteric. Reactive to light. Ears, Nose, Mouth, and Throat Lips, teeth, and gums WNL.Marland Kitchen Moist mucosa without lesions. Neck supple and nontender. No palpable supraclavicular or cervical adenopathy. Normal sized without goiter. Respiratory WNL. No  retractions.. Cardiovascular Pedal Pulses WNL. No clubbing, cyanosis or edema. Lymphatic No adneopathy. No adenopathy. No adenopathy. Musculoskeletal Adexa without tenderness or enlargement.. Digits and nails w/o clubbing, cyanosis, infection, petechiae, ischemia, or inflammatory conditions.Marland Kitchen Psychiatric Judgement and insight Intact.. No evidence of depression, anxiety, or agitation.. General Notes: the lymphedema and the weeping from the posterior part of his wound continues but overall there has been improvement. Integumentary (Hair, Skin) No suspicious lesions. No crepitus or fluctuance. No peri-wound warmth or erythema. No masses.. Wound #1 status is Open. Original cause of wound was Gradually Appeared. The wound is located on the Left,Anterior Lower Leg. The wound measures 4cm length x  4.5cm width x 0.1cm depth; 14.137cm^2 area and 1.414cm^3 volume. Wound #2 status is Open. Original cause of wound was Gradually Appeared. The wound is located on the Left,Posterior Lower Leg. The wound measures 7.5cm length x 10cm width x 0.1cm depth; 58.905cm^2 area and 5.89cm^3 volume. [name], [name] ([id number]) Assessment Active Problems ICD-10 I89.0 - Lymphedema, not elsewhere classified L97.222 - Non-pressure chronic ulcer of left calf with fat layer exposed E66.01 - Morbid (severe) obesity due to excess calories F17.218 - Nicotine dependence, cigarettes, with other nicotine-induced disorders L03.116 - Cellulitis of left lower limb Procedures Wound #1 Wound #1 is a Venous Leg Ulcer located on the Left,Anterior Lower Leg . There was a Three Layer Compression Therapy Procedure with a pre-treatment ABI of 1 by Cornell Barman, RN. Post procedure Diagnosis Wound #1: Same as Pre-Procedure Plan Wound Cleansing: Wound #1 Left,Anterior Lower Leg: Clean wound with Normal Saline. Cleanse wound with mild soap and water - HHRN to clean with soap and water when changing wraps. Wound #2 Left,Posterior  Lower Leg: Clean wound with Normal Saline. Cleanse wound with mild soap and water - HHRN to clean with soap and water when changing wraps. Primary Wound Dressing: Wound #1 Left,Anterior Lower Leg: Aquacel Ag - or Equal Wound #2 Left,Posterior Lower Leg: Aquacel Ag - or Equal Secondary Dressing: Wound #1 Left,Anterior Lower Leg: ABD pad Wound #2 Left,Posterior Lower Leg: ABD pad Dressing Change Frequency: Wound #1 Left,Anterior Lower Leg: [name], DEBAKER. ([id number]) Three times weekly - HHRN to change Mondays and Wednesdays. Pt will come to wound care clinic on Fridays to have it changed there. Wound #2 Left,Posterior Lower Leg: Three times weekly - HHRN to change Mondays and Wednesdays. Pt will come to wound care clinic on Fridays to have it changed there. Follow-up Appointments: Wound #1 Left,Anterior Lower Leg: Return Appointment in 1 week. Wound #2 Left,Posterior Lower Leg: Return Appointment in 1 week. Edema Control: Wound #1 Left,Anterior Lower Leg: 3 Layer Compression System - Left Lower Extremity - Wrap from base of toes to 3cm below knee. Elevate legs to the level of the heart and pump ankles as often as possible Wound #2 Left,Posterior Lower Leg: 3 Layer Compression System - Left Lower Extremity - Wrap from base of toes to 3cm below knee. Elevate legs to the level of the heart and pump ankles as often as possible Additional Orders / Instructions: Wound #1 Left,Anterior Lower Leg: Stop Smoking Increase protein intake. Wound #2 Left,Posterior Lower Leg: Stop Smoking Increase protein intake. Home Health: Wound #1 Left,Anterior Lower Leg: Continue Home Health Visits - [facility] to change Mondays and Wednesdays. Pt will come to wound care clinic on Fridays to have it changed there. Home Health Nurse may visit PRN to address patient s wound care needs. FACE TO FACE ENCOUNTER: MEDICARE and MEDICAID PATIENTS: I certify that this patient is under my care and that I had a  face-to-face encounter that meets the physician face-to-face encounter requirements with this patient on this date. The encounter with the patient was in whole or in part for the following MEDICAL CONDITION: (primary reason for [place]) MEDICAL NECESSITY: I certify, that based on my findings, NURSING services are a medically necessary home health service. HOME BOUND STATUS: I certify that my clinical findings support that this patient is homebound (i.e., Due to illness or injury, pt requires aid of supportive devices such as crutches, cane, wheelchairs, walkers, the use of special transportation or the assistance of another person to leave their place of  residence. There is a normal inability to leave the home and doing so requires considerable and taxing effort. Other absences are for medical reasons / religious services and are infrequent or of short duration when for other reasons). If current dressing causes regression in wound condition, may D/C ordered dressing product/s and apply Normal Saline Moist Dressing daily until next [place] / Other MD appointment. [place] of regression in wound condition at [phone]. Please direct any NON-WOUND related issues/requests for orders to patient's Primary Care Physician Wound #2 Left,Posterior Lower Leg: [place] Visits - [facility] to change Mondays and Wednesdays. Pt will come to wound care clinic on Fridays to have it changed there. Home Health Nurse may visit PRN to address patient s wound care needs. FACE TO FACE ENCOUNTER: MEDICARE and MEDICAID PATIENTS: I certify that this patient is under my care and that I had a face-to-face encounter that meets the physician face-to-face encounter requirements with this patient on this date. The encounter with the patient was in whole or in part for the following MEDICAL CONDITION: (primary reason for [place]) MEDICAL NECESSITY: I certify, [place], [place] ([id number]) that based on my findings, NURSING services are a medically necessary home health service. HOME BOUND STATUS: I certify that my clinical findings support that this patient is homebound (i.e., Due to illness or injury, pt requires aid of supportive devices such as crutches, cane, wheelchairs, walkers, the use of special transportation or the assistance of another person to leave their place of residence. There is a normal inability to leave the home and doing so requires considerable and taxing effort. Other absences are for medical reasons / religious services and are infrequent or of short duration when for other reasons). If current dressing causes regression in wound condition, may D/C ordered dressing product/s and apply Normal Saline Moist Dressing daily until next [place] / Other MD appointment. [place] of regression in wound condition at [phone]. Please direct any NON-WOUND related issues/requests for orders to patient's Primary Care Physician Medications-please add to medication list.: Wound #1 Left,Anterior Lower Leg: BellO. Antibiotics - Complete Doxycycline Other: - Vitamin C, Zinc, Multivitamin Wound #2 Left,Posterior Lower Leg: BellO. Antibiotics - Complete Doxycycline Other: - Vitamin C, Zinc, Multivitamin I have recommended: 1. Silver alginate, DrawTex and a 3 layer Profore wrap to the left lower extremity. 2. arterial duplex and venous duplex studies for reflux both lower extremities -- appointment pending 3. If he is going to be following up here he needs to find a PCP to help control his medical issues -- appointment in first week of February 4. again discussed the need to completely give up smoking have discussed the risks benefits and alternatives and all the possible complications 5. Regular visits to the wound center Electronic Signature(s) Signed: 01/31/2016 3:20:11 PM By: Christin Fudge MD, FACS Entered By:  Christin Fudge on 01/31/2016 15:20:11 Esther, Sevan P. ([id number]) -------------------------------------------------------------------------------- SuperBill Details Patient Name: Moorer, Yahmir P. Date of Service: 01/31/2016 Medical Record Number: [id number] Patient Account Number: [id number] Date of Birth/Sex: 1965/05/16 (51 y.o. Male) Treating RN: Cornell Barman Primary Care Physician: PATIENT, NO Other Clinician: Referring Physician: Loletha Grayer Treating Physician/Extender: Frann Rider in Treatment: 2 Diagnosis Coding ICD-10 Codes Code Description I89.0 Lymphedema, not elsewhere classified L97.222 Non-pressure chronic ulcer of left calf with fat layer exposed E66.01 Morbid (severe) obesity due to excess calories F17.218 Nicotine dependence, cigarettes, with other nicotine-induced disorders L03.116 Cellulitis of left lower limb  Facility Procedures CPT4: Description Modifier Quantity Code [id number] (Facility Use Only) [phone] - [place] [facility] LT 1 LEG Physician Procedures CPT4 Code: [id number] Description: [id number] - WC PHYS LEVEL 3 - EST PT ICD-10 Description Diagnosis I89.0 Lymphedema, not elsewhere classified 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

## 2016-02-01 NOTE — Progress Notes (Signed)
[name] ([id number]) Visit Report for 01/31/2016 Arrival Information Details Patient Name: [name], [name]. Date of Service: 01/31/2016 2:30 PM Medical Record Number: [id number] Patient Account Number: [id number] Date of Birth/Sex: 07/16/1965 (51 y.o. Male) Treating RN: Cornell Barman Primary Care Physician: PATIENT, NO Other Clinician: Referring Physician: Loletha Grayer Treating Physician/Extender: Frann Rider in Treatment: 2 Visit Information History Since Last Visit Added or deleted any medications: No Patient Arrived: Ambulatory Any new allergies or adverse reactions: No Arrival Time: 14:45 Had a fall or experienced change in No Accompanied By: sister activities of daily living that may affect Transfer Assistance: None risk of falls: Patient Identification Verified: Yes Signs or symptoms of abuse/neglect since last No Secondary Verification Process Yes visito Completed: Hospitalized since last visit: No Patient Requires Transmission-Based No Has Dressing in Place as Prescribed: Yes Precautions: Has Compression in Place as Prescribed: Yes Patient Has Alerts: Yes Pain Present Now: No Patient Alerts: ASA Electronic Signature(s) Signed: 01/31/2016 4:17:53 PM By: Gretta Cool, RN, BSN, Kim RN, BSN Entered By: Gretta Cool, RN, BSN, Kim on 01/31/2016 14:46:07 [name] ([id number]) -------------------------------------------------------------------------------- Compression Therapy Details Patient Name: [name], [name] Date of Service: 01/31/2016 2:30 PM Medical Record Number: [id number] Patient Account Number: [id number] Date of Birth/Sex: [birth year] (51 y.o. Male) Treating RN: Cornell Barman Primary Care Physician: PATIENT, NO Other Clinician: Referring Physician: Loletha Grayer Treating Physician/Extender: Frann Rider in Treatment: 2 Compression Therapy Performed for Wound Wound #1 Left,Anterior Lower Leg Assessment: Performed By: Clinician Cornell Barman, RN Compression Type: Three Layer Pre Treatment ABI: 1 Post Procedure Diagnosis Same as Pre-procedure Electronic Signature(s) Signed: 01/31/2016 4:17:53 PM By: Gretta Cool, RN, BSN, Kim RN, BSN Entered By: Gretta Cool, RN, BSN, Kim on 01/31/2016 15:13:25 [name] ([id number]) -------------------------------------------------------------------------------- Encounter Discharge Information Details Patient Name: [name], [name] Date of Service: 01/31/2016 2:30 PM Medical Record Number: [id number] Patient Account Number: [id number] Date of Birth/Sex: 07/04/1965 (51 y.o. Male) Treating RN: Carolyne Fiscal, Debi Primary Care Physician: PATIENT, NO Other Clinician: Referring Physician: Loletha Grayer Treating Physician/Extender: Frann Rider in Treatment: 2 Encounter Discharge Information Items Discharge Pain Level: 3 Discharge Condition: Stable Ambulatory Status: Ambulatory Discharge Destination: Home Transportation: Private Auto Accompanied By: self Schedule Follow-up Appointment: Yes Medication Reconciliation completed and provided to Patient/Care Yes : Provided on Clinical Summary of Care: 01/31/2016 Form Type Recipient Paper Patient RB Electronic Signature(s) Signed: 01/31/2016 4:17:53 PM By: Gretta Cool RN, BSN, Kim RN, BSN Previous Signature: 01/31/2016 3:17:50 PM Version By: Ruthine Dose Entered By: Gretta Cool RN, BSN, Kim on 01/31/2016 15:22:45 [name] ([id number]) -------------------------------------------------------------------------------- Lower Extremity Assessment Details Patient Name: [name], [name] Date of Service: 01/31/2016 2:30 PM Medical Record Number: [id number] Patient Account Number: [id number] Date of Birth/Sex: 1965/08/03 (51 y.o. Male) Treating RN: Cornell Barman Primary Care Physician: PATIENT, NO Other Clinician: Referring Physician: Loletha Grayer Treating Physician/Extender: Frann Rider in Treatment: 2 Edema  Assessment Assessed: [Left: No] [Right: No] E[Left: dema] [Right: :] Calf Left: Right: Point of Measurement: 34 cm From Medial Instep 52.5 cm cm Ankle Left: Right: Point of Measurement: 11 cm From Medial Instep 28.8 cm cm Vascular Assessment Claudication: Claudication Assessment [Left:None] Pulses: Dorsalis Pedis Palpable: [Left:Yes] Posterior Tibial Extremity colors, hair growth, and conditions: Extremity Color: [Left:Red] Hair Growth on Extremity: [Left:Yes] Temperature of Extremity: [Left:Warm] Capillary Refill: [Left:< 3 seconds] Dependent Rubor: [Left:Yes] Blanched when Elevated: [Left:No] Lipodermatosclerosis: [Left:No] Toe Nail Assessment Left: Right: Thick: Yes Discolored: Yes Deformed: Yes Improper Length and Hygiene: Yes [name] ([id number]) Electronic Signature(s) Signed: 01/31/2016  4:17:53 PM By: Gretta Cool, RN, BSN, Kim RN, BSN Entered By: Gretta Cool, RN, BSN, Kim on 01/31/2016 15:00:48 [name] ([id number]) -------------------------------------------------------------------------------- Multi Wound Chart Details Patient Name: [name], [name] Date of Service: 01/31/2016 2:30 PM Medical Record Number: [id number] Patient Account Number: [id number] Date of Birth/Sex: Jun 03, 1965 (51 y.o. Male) Treating RN: Cornell Barman Primary Care Physician: PATIENT, NO Other Clinician: Referring Physician: Loletha Grayer Treating Physician/Extender: Frann Rider in Treatment: 2 Vital Signs Height(in): 69 Pulse(bpm): 98 Weight(lbs): 316 Blood Pressure 128/83 (mmHg): Body Mass Index(BMI): 47 Temperature(F): 97.9 Respiratory Rate 16 (breaths/min): Photos: [1:No Photos] [2:No Photos] [N/A:N/A] Wound Location: [1:Left, Anterior Lower Leg] [2:Left, Posterior Lower Leg] [N/A:N/A] Wounding Event: [1:Gradually Appeared] [2:Gradually Appeared] [N/A:N/A] Primary Etiology: [1:Venous Leg Ulcer] [2:Venous Leg Ulcer] [N/A:N/A] Date Acquired: [1:12/26/2015]  [2:12/26/2015] [N/A:N/A] Weeks of Treatment: [1:2] [2:2] [N/A:N/A] Wound Status: [1:Open] [2:Open] [N/A:N/A] Measurements L x W x D 4x4.5x0.1 [2:7.5x10x0.1] [N/A:N/A] (cm) Area (cm) : [1:14.137] [2:58.905] [N/A:N/A] Volume (cm) : [1:1.414] [2:5.89] [N/A:N/A] % Reduction in Area: [1:45.50%] [2:33.20%] [N/A:N/A] % Reduction in Volume: 45.40% [2:33.30%] [N/A:N/A] Classification: [1:Partial Thickness] [2:Partial Thickness] [N/A:N/A] Periwound Skin Texture: No Abnormalities Noted [2:No Abnormalities Noted] [N/A:N/A] Periwound Skin [1:No Abnormalities Noted] [2:No Abnormalities Noted] [N/A:N/A] Moisture: Periwound Skin Color: No Abnormalities Noted [2:No Abnormalities Noted] [N/A:N/A] Tenderness on [1:No] [2:No] [N/A:N/A] Palpation: Procedures Performed: Compression Therapy [2:N/A] [N/A:N/A] Treatment Notes Wound #1 (Left, Anterior Lower Leg) 1. Cleansed with: Clean wound with Normal Saline 2. Anesthetic Topical Lidocaine 4% cream to wound bed prior to debridement 4. Dressing Applied: NEFTALY, SWISS. ([id number]) Aquacel Ag 5. Secondary Dressing Applied ABD Pad 7. Secured with 3 Layer Compression System - Left Lower Extremity Wound #2 (Left, Posterior Lower Leg) 1. Cleansed with: Clean wound with Normal Saline 2. Anesthetic Topical Lidocaine 4% cream to wound bed prior to debridement 4. Dressing Applied: Aquacel Ag 5. Secondary Dressing Applied ABD Pad 7. Secured with 3 Layer Compression System - Left Lower Extremity Electronic Signature(s) Signed: 01/31/2016 3:18:16 PM By: Christin Fudge MD, FACS Entered By: Christin Fudge on 01/31/2016 15:18:16 Scheurich, Jahlon Mamie [name] ([id number]) -------------------------------------------------------------------------------- [place] Details Patient Name: [name], [name] Date of Service: 01/31/2016 2:30 PM Medical Record Number: [id number] Patient Account Number: [id number] Date of Birth/Sex: 1965/03/09 (51 y.o.  Male) Treating RN: Cornell Barman Primary Care Physician: PATIENT, NO Other Clinician: Referring Physician: Loletha Grayer Treating Physician/Extender: Frann Rider in Treatment: 2 Active Inactive Abuse / Safety / Falls / Self Care Management Nursing Diagnoses: Potential for falls Goals: Patient will remain injury free Date Initiated: 01/16/2016 Goal Status: Active Interventions: Assess fall risk on admission and as needed Assess self care needs on admission and as needed Notes: Nutrition Nursing Diagnoses: Imbalanced nutrition Potential for alteratiion in Nutrition/Potential for imbalanced nutrition Goals: Patient/caregiver agrees to and verbalizes understanding of need to use nutritional supplements and/or vitamins as prescribed Date Initiated: 01/16/2016 Goal Status: Active Interventions: Assess patient nutrition upon admission and as needed per policy Notes: Orientation to the Wound Care Program Nursing Diagnoses: Knowledge deficit related to the wound healing center program [name], [name] ([id number]) Goals: Patient/caregiver will verbalize understanding of the [place] Date Initiated: 01/16/2016 Goal Status: Active Interventions: Provide education on orientation to the wound center Notes: Pain, Acute or Chronic Nursing Diagnoses: Pain, acute or chronic: actual or potential Potential alteration in comfort, pain Goals: Patient will verbalize adequate pain control and receive pain control interventions during procedures as needed Date Initiated: 01/16/2016 Goal Status: Active Patient/caregiver will verbalize adequate pain control  between visits Date Initiated: 01/16/2016 Goal Status: Active Patient/caregiver will verbalize comfort level met Date Initiated: 01/16/2016 Goal Status: Active Interventions: Assess comfort goal upon admission Complete pain assessment as per visit requirements Notes: Venous Leg Ulcer Nursing Diagnoses: Actual  venous Insuffiency (use after diagnosis is confirmed) Knowledge deficit related to disease process and management Potential for venous Insuffiency (use before diagnosis confirmed) Goals: Patient will maintain optimal edema control Date Initiated: 01/16/2016 Goal Status: Active Patient/caregiver will verbalize understanding of disease process and disease management [name], [name] ([id number]) Date Initiated: 01/16/2016 Goal Status: Active Interventions: Assess peripheral edema status every visit. Provide education on venous insufficiency Notes: Wound/Skin Impairment Nursing Diagnoses: Impaired tissue integrity Knowledge deficit related to smoking impact on wound healing Knowledge deficit related to ulceration/compromised skin integrity Goals: Ulcer/skin breakdown will have a volume reduction of 30% by week 4 Date Initiated: 01/16/2016 Goal Status: Active Ulcer/skin breakdown will have a volume reduction of 50% by week 8 Date Initiated: 01/16/2016 Goal Status: Active Ulcer/skin breakdown will have a volume reduction of 80% by week 12 Date Initiated: 01/16/2016 Goal Status: Active Interventions: Assess patient/caregiver ability to perform ulcer/skin care regimen upon admission and as needed Assess ulceration(s) every visit Provide education on smoking Provide education on ulcer and skin care Notes: Electronic Signature(s) Signed: 01/31/2016 4:17:53 PM By: Gretta Cool, RN, BSN, Kim RN, BSN Entered By: Gretta Cool, RN, BSN, Kim on 01/31/2016 14:58:47 Mose, Hewitt Mamie [name] ([id number]) -------------------------------------------------------------------------------- Pain Assessment Details Patient Name: [name], [name] Date of Service: 01/31/2016 2:30 PM Medical Record Number: [id number] Patient Account Number: [id number] Date of Birth/Sex: August 16, 1965 (51 y.o. Male) Treating RN: Cornell Barman Primary Care Physician: PATIENT, NO Other Clinician: Referring Physician: Loletha Grayer Treating  Physician/Extender: Frann Rider in Treatment: 2 Active Problems Location of Pain Severity and Description of Pain Patient Has Paino No Site Locations With Dressing Change: No Pain Management and Medication Current Pain Management: Electronic Signature(s) Signed: 01/31/2016 4:17:53 PM By: Gretta Cool, RN, BSN, Kim RN, BSN Entered By: Gretta Cool, RN, BSN, Kim on 01/31/2016 14:46:15 Beers, Wendie [name] ([id number]) -------------------------------------------------------------------------------- Patient/Caregiver Education Details Patient Name: [name], [name] Date of Service: 01/31/2016 2:30 PM Medical Record Number: [id number] Patient Account Number: [id number] Date of Birth/Gender: 03/17/1965 (51 y.o. Male) Treating RN: Cornell Barman Primary Care Physician: PATIENT, NO Other Clinician: Referring Physician: Loletha Grayer Treating Physician/Extender: Frann Rider in Treatment: 2 Education Assessment Education Provided To: Caregiver Education Topics Provided Wound/Skin Impairment: Handouts: Caring for Your Ulcer Methods: Demonstration, Explain/Verbal Responses: State content correctly Electronic Signature(s) Signed: 01/31/2016 4:17:53 PM By: Gretta Cool, RN, BSN, Kim RN, BSN Entered By: Gretta Cool, RN, BSN, Kim on 01/31/2016 15:23:00 Eckerman, Rodolphe Mamie [name] ([id number]) -------------------------------------------------------------------------------- Wound Assessment Details Patient Name: [name], [name] Date of Service: 01/31/2016 2:30 PM Medical Record Number: [id number] Patient Account Number: [id number] Date of Birth/Sex: [birth year] (51 y.o. Male) Treating RN: Cornell Barman Primary Care Physician: PATIENT, NO Other Clinician: Referring Physician: Loletha Grayer Treating Physician/Extender: Frann Rider in Treatment: 2 Wound Status Wound Number: 1 Primary Etiology: Venous Leg Ulcer Wound Location: Left, Anterior Lower Leg Wound Status: Open Wounding Event: Gradually  Appeared Date Acquired: 12/26/2015 Weeks Of Treatment: 2 Clustered Wound: No Wound Measurements Length: (cm) 4 Width: (cm) 4.5 Depth: (cm) 0.1 Area: (cm) 14.137 Volume: (cm) 1.414 % Reduction in Area: 45.5% % Reduction in Volume: 45.4% Wound Description Classification: Partial Thickness Periwound Skin Texture Texture Color No Abnormalities Noted: No No Abnormalities Noted: No Moisture No Abnormalities Noted: No Treatment Notes Wound #1 (Left, Anterior Lower Leg) 1. Cleansed  with: Clean wound with Normal Saline 2. Anesthetic Topical Lidocaine 4% cream to wound bed prior to debridement 4. Dressing Applied: Aquacel Ag 5. Secondary Dressing Applied ABD Pad 7. Secured with 3 Layer Compression System - Left Lower Extremity Electronic Signature(s) BRAYDON, KULLMAN ([id number]) Signed: 01/31/2016 4:17:53 PM By: Gretta Cool, RN, BSN, Kim RN, BSN Entered By: Gretta Cool, RN, BSN, Kim on 01/31/2016 14:56:21 Gashi, Wendie [name] ([id number]) -------------------------------------------------------------------------------- Wound Assessment Details Patient Name: [name], [name] Date of Service: 01/31/2016 2:30 PM Medical Record Number: [id number] Patient Account Number: [id number] Date of Birth/Sex: [birth year] (51 y.o. Male) Treating RN: Cornell Barman Primary Care Physician: PATIENT, NO Other Clinician: Referring Physician: Loletha Grayer Treating Physician/Extender: Frann Rider in Treatment: 2 Wound Status Wound Number: 2 Primary Etiology: Venous Leg Ulcer Wound Location: Left, Posterior Lower Leg Wound Status: Open Wounding Event: Gradually Appeared Date Acquired: 12/26/2015 Weeks Of Treatment: 2 Clustered Wound: No Wound Measurements Length: (cm) 7.5 Width: (cm) 10 Depth: (cm) 0.1 Area: (cm) 58.905 Volume: (cm) 5.89 % Reduction in Area: 33.2% % Reduction in Volume: 33.3% Wound Description Classification: Partial Thickness Periwound Skin Texture Texture  Color No Abnormalities Noted: No No Abnormalities Noted: No Moisture No Abnormalities Noted: No Treatment Notes Wound #2 (Left, Posterior Lower Leg) 1. Cleansed with: Clean wound with Normal Saline 2. Anesthetic Topical Lidocaine 4% cream to wound bed prior to debridement 4. Dressing Applied: Aquacel Ag 5. Secondary Dressing Applied ABD Pad 7. Secured with 3 Layer Compression System - Left Lower Extremity Electronic Signature(s) FAYEZ, STURGELL ([id number]) Signed: 01/31/2016 4:17:53 PM By: Gretta Cool, RN, BSN, Kim RN, BSN Entered By: Gretta Cool, RN, BSN, Kim on 01/31/2016 14:56:22 Punt, Wendie [name] ([id number]) -------------------------------------------------------------------------------- Vitals Details Patient Name: [name], [name] Date of Service: 01/31/2016 2:30 PM Medical Record Number: [id number] Patient Account Number: [id number] Date of Birth/Sex: [birth year] (51 y.o. Male) Treating RN: Cornell Barman Primary Care Physician: PATIENT, NO Other Clinician: Referring Physician: Loletha Grayer Treating Physician/Extender: Frann Rider in Treatment: 2 Vital Signs Time Taken: 14:46 Temperature (F): 97.9 Height (in): 69 Pulse (bpm): 98 Weight (lbs): 316 Respiratory Rate (breaths/min): 16 Body Mass Index (BMI): 46.7 Blood Pressure (mmHg): 128/83 Reference Range: 80 - 120 mg / dl Electronic Signature(s) Signed: 01/31/2016 4:17:53 PM By: Gretta Cool, RN, BSN, Kim RN, BSN Entered By: Gretta Cool, RN, BSN, Kim on 01/31/2016 14:48:58

## 2016-02-06 ENCOUNTER — Other Ambulatory Visit (INDEPENDENT_AMBULATORY_CARE_PROVIDER_SITE_OTHER): Payer: BLUE CROSS/BLUE SHIELD

## 2016-02-06 ENCOUNTER — Encounter (INDEPENDENT_AMBULATORY_CARE_PROVIDER_SITE_OTHER): Payer: Self-pay

## 2016-02-06 ENCOUNTER — Ambulatory Visit: Payer: BLUE CROSS/BLUE SHIELD | Admitting: Surgery

## 2016-02-06 ENCOUNTER — Other Ambulatory Visit: Payer: Self-pay | Admitting: Surgery

## 2016-02-06 DIAGNOSIS — L97222 Non-pressure chronic ulcer of left calf with fat layer exposed: Secondary | ICD-10-CM | POA: Diagnosis not present

## 2016-02-07 ENCOUNTER — Encounter: Payer: BLUE CROSS/BLUE SHIELD | Admitting: Surgery

## 2016-02-07 DIAGNOSIS — I890 Lymphedema, not elsewhere classified: Secondary | ICD-10-CM | POA: Diagnosis not present

## 2016-02-08 NOTE — Progress Notes (Signed)
Jonathan, [name] ([id number]) Visit Report for 02/07/2016 Arrival Information Details Patient Name: [name], [name]. Date of Service: 02/07/2016 3:30 PM Medical Record Number: [id number] Patient Account Number: [id number] Date of Birth/Sex: January 30, 1965 (51 y.o. Male) Treating RN: Carolyne Fiscal, Debi Primary Care : PATIENT, NO Other Clinician: Referring : Loletha Grayer Treating /Extender: Frann Rider in Treatment: 3 Visit Information History Since Last Visit All ordered tests and consults were completed: No Patient Arrived: Ambulatory Added or deleted any medications: No Arrival Time: 15:39 Any new allergies or adverse reactions: No Accompanied By: sister Had a fall or experienced change in No Transfer Assistance: None activities of daily living that may affect Patient Identification Verified: Yes risk of falls: Secondary Verification Process Yes Signs or symptoms of abuse/neglect since last No Completed: visito Patient Requires Transmission-Based No Hospitalized since last visit: No Precautions: Has Dressing in Place as Prescribed: Yes Patient Has Alerts: Yes Has Compression in Place as Prescribed: Yes Patient Alerts: ASA Pain Present Now: Yes Electronic Signature(s) Signed: 02/07/2016 4:32:35 PM By: Alric Quan Entered By: Alric Quan on 02/07/2016 15:39:43 Lindquist, Davone P. ([id number]) -------------------------------------------------------------------------------- Encounter Discharge Information Details Patient Name: Jonathan, Lovelle P. Date of Service: 02/07/2016 3:30 PM Medical Record Number: [id number] Patient Account Number: [id number] Date of Birth/Sex: 02/03/1965 (51 y.o. Male) Treating RN: Carolyne Fiscal, Debi Primary Care : PATIENT, NO Other Clinician: Referring : Loletha Grayer Treating /Extender: Frann Rider in Treatment: 3 Encounter Discharge Information Items Discharge Pain Level:  0 Discharge Condition: Stable Ambulatory Status: Ambulatory Discharge Destination: Home Private Transportation: Auto Accompanied By: daughter Schedule Follow-up Appointment: Yes Medication Reconciliation completed and No provided to Patient/Care : Clinical Summary of Care: Electronic Signature(s) Signed: 02/07/2016 4:32:35 PM By: Alric Quan Previous Signature: 02/07/2016 4:05:58 PM Version By: Ruthine Dose Entered By: Alric Quan on 02/07/2016 16:06:15 Mandelbaum, Aidon P. ([id number]) -------------------------------------------------------------------------------- Lower Extremity Assessment Details Patient Name: [name], [name] Date of Service: 02/07/2016 3:30 PM Medical Record Number: [id number] Patient Account Number: [id number] Date of Birth/Sex: 06-23-1965 (51 y.o. Male) Treating RN: Carolyne Fiscal, Debi Primary Care : PATIENT, NO Other Clinician: Referring : Loletha Grayer Treating /Extender: Frann Rider in Treatment: 3 Edema Assessment Assessed: [Left: No] [Right: No] E[Left: dema] [Right: :] Calf Left: Right: Point of Measurement: 34 cm From Medial Instep 52 cm cm Ankle Left: Right: Point of Measurement: 11 cm From Medial Instep 28.4 cm cm Vascular Assessment Pulses: Dorsalis Pedis Palpable: [Left:Yes] Doppler Audible: [Left:Yes] Posterior Tibial Extremity colors, hair growth, and conditions: Extremity Color: [Left:Hyperpigmented] Hair Growth on Extremity: [Left:No] Temperature of Extremity: [Left:Warm] Capillary Refill: [Left:< 3 seconds] Blood Pressure: Brachial: [Right:110] Dorsalis Pedis: [Left:Dorsalis Pedis: 116] Ankle: Posterior Tibial: [Left:Posterior Tibial: 120] [Right:1.09] Toe Nail Assessment Left: Right: Thick: Yes Discolored: Yes Deformed: No Improper Length and Hygiene: Yes Electronic Signature(s) KAULIN, CHAVES ([id number]) Signed: 02/07/2016 4:32:35 PM By: Alric Quan Entered  By: Alric Quan on 02/07/2016 15:47:59 Stfleur, Anselm P. ([id number]) -------------------------------------------------------------------------------- Multi Wound Chart Details Patient Name: [name], [name] Date of Service: 02/07/2016 3:30 PM Medical Record Number: [id number] Patient Account Number: [id number] Date of Birth/Sex: [birth year] (51 y.o. Male) Treating RN: Carolyne Fiscal, Debi Primary Care : PATIENT, NO Other Clinician: Referring : Loletha Grayer Treating /Extender: Frann Rider in Treatment: 3 Vital Signs Height(in): 69 Pulse(bpm): 93 Weight(lbs): 316 Blood Pressure 119/54 (mmHg): Body Mass Index(BMI): 47 Temperature(F): 98.1 Respiratory Rate 18 (breaths/min): Photos: [1:No Photos] [2:No Photos] [N/A:N/A] Wound Location: [1:Left Lower Leg - Anterior] [2:Left Lower Leg - Posterior] [N/A:N/A] Wounding Event: [1:Gradually Appeared] [2:Gradually Appeared] [  N/A:N/A] Primary Etiology: [1:Venous Leg Ulcer] [2:Venous Leg Ulcer] [N/A:N/A] Comorbid History: [1:Chronic Obstructive Pulmonary Disease (COPD), Sleep Apnea, Congestive Heart Failure, Coronary Artery Disease, Seizure Disorder] [2:Chronic Obstructive Pulmonary Disease (COPD), Sleep Apnea, Congestive Heart Failure, Coronary Artery  Disease, Seizure Disorder] [N/A:N/A] Date Acquired: [1:12/26/2015] [2:12/26/2015] [N/A:N/A] Weeks of Treatment: [1:3] [2:3] [N/A:N/A] Wound Status: [1:Open] [2:Open] [N/A:N/A] Measurements L x W x D 3.3x4.3x0.1 [2:2x2x0.1] [N/A:N/A] (cm) Area (cm) : [1:11.145] [2:3.142] [N/A:N/A] Volume (cm) : [1:1.114] [2:0.314] [N/A:N/A] % Reduction in Area: [1:57.00%] [2:96.40%] [N/A:N/A] % Reduction in Volume: 57.00% [2:96.40%] [N/A:N/A] Classification: [1:Partial Thickness] [2:Partial Thickness] [N/A:N/A] Exudate Amount: [1:Large] [2:Large] [N/A:N/A] Exudate Type: [1:Serous] [2:Serous] [N/A:N/A] Exudate Color: [1:amber] [2:amber] [N/A:N/A] Wound Margin:  [1:Flat and Intact] [2:Flat and Intact] [N/A:N/A] Granulation Amount: [1:Medium (34-66%)] [2:Large (67-100%)] [N/A:N/A] Granulation Quality: [1:Pink] [2:Red] [N/A:N/A] Necrotic Amount: [1:Medium (34-66%)] [2:None Present (0%)] [N/A:N/A] Exposed Structures: [1:Fascia: No Fat Layer (Subcutaneous Tissue) Exposed: No] [2:Fascia: No Fat Layer (Subcutaneous Tissue) Exposed: No] [N/A:N/A] Tendon: No Tendon: No Muscle: No Muscle: No Joint: No Joint: No Bone: No Bone: No Limited to Skin Limited to Skin Breakdown Breakdown Epithelialization: Small (1-33%) Large (67-100%) N/A Periwound Skin Texture: Excoriation: No Excoriation: No N/A Induration: No Induration: No Callus: No Callus: No Crepitus: No Crepitus: No Rash: No Rash: No Scarring: No Scarring: No Periwound Skin Maceration: No Maceration: No N/A Moisture: Dry/Scaly: No Dry/Scaly: No Periwound Skin Color: Atrophie Blanche: No Atrophie Blanche: No N/A Cyanosis: No Cyanosis: No Ecchymosis: No Ecchymosis: No Erythema: No Erythema: No Hemosiderin Staining: No Hemosiderin Staining: No Mottled: No Mottled: No Pallor: No Pallor: No Rubor: No Rubor: No Temperature: No Abnormality No Abnormality N/A Tenderness on Yes Yes N/A Palpation: Wound Preparation: Ulcer Cleansing: Other: Ulcer Cleansing: Other: N/A soap and water soap and water Topical Anesthetic Topical Anesthetic Applied: Other: lidocaine Applied: Other: lidocaine 4% 4% Treatment Notes Electronic Signature(s) Signed: 02/07/2016 4:01:34 PM By: Christin Fudge MD, FACS Entered By: Christin Fudge on 02/07/2016 16:01:34 Cimini, Rudi Mamie Nick ([id number]) -------------------------------------------------------------------------------- [place] Details Patient Name: Hari, Donivin P. Date of Service: 02/07/2016 3:30 PM Medical Record Number: [id number] Patient Account Number: [id number] Date of Birth/Sex: 26-Jun-1965 (51 y.o. Male) Treating RN:  Carolyne Fiscal, Debi Primary Care : PATIENT, NO Other Clinician: Referring : Loletha Grayer Treating /Extender: Frann Rider in Treatment: 3 Active Inactive ` Abuse / Safety / Falls / Self Care Management Nursing Diagnoses: Potential for falls Goals: Patient will remain injury free Date Initiated: 01/16/2016 Target Resolution Date: 03/11/2016 Goal Status: Active Interventions: Assess fall risk on admission and as needed Assess self care needs on admission and as needed Notes: ` Nutrition Nursing Diagnoses: Imbalanced nutrition Potential for alteratiion in Nutrition/Potential for imbalanced nutrition Goals: Patient/caregiver agrees to and verbalizes understanding of need to use nutritional supplements and/or vitamins as prescribed Date Initiated: 01/16/2016 Target Resolution Date: 03/11/2016 Goal Status: Active Interventions: Assess patient nutrition upon admission and as needed per policy Notes: ` Orientation to the [place], Hyatt P. ([id number]) Nursing Diagnoses: Knowledge deficit related to the wound healing center program Goals: Patient/caregiver will verbalize understanding of the [facility] Date Initiated: 01/16/2016 Target Resolution Date: 03/11/2016 Goal Status: Active Interventions: Provide education on orientation to the wound center Notes: ` Pain, Acute or Chronic Nursing Diagnoses: Pain, acute or chronic: actual or potential Potential alteration in comfort, pain Goals: Patient will verbalize adequate pain control and receive pain control interventions during procedures as needed Date Initiated: 01/16/2016 Target Resolution Date: 03/11/2016 Goal Status: Active Patient/caregiver will verbalize  adequate pain control between visits Date Initiated: 01/16/2016 Target Resolution Date: 03/11/2016 Goal Status: Active Patient/caregiver will verbalize comfort level met Date Initiated: 01/16/2016 Target  Resolution Date: 03/11/2016 Goal Status: Active Interventions: Assess comfort goal upon admission Complete pain assessment as per visit requirements Notes: ` Venous Leg Ulcer Nursing Diagnoses: Actual venous Insuffiency (use after diagnosis is confirmed) Knowledge deficit related to disease process and management Potential for venous Insuffiency (use before diagnosis confirmed) ALEXES, LAMARQUE ([id number]) Goals: Patient will maintain optimal edema control Date Initiated: 01/16/2016 Target Resolution Date: 03/11/2016 Goal Status: Active Patient/caregiver will verbalize understanding of disease process and disease management Date Initiated: 01/16/2016 Target Resolution Date: 03/11/2016 Goal Status: Active Interventions: Assess peripheral edema status every visit. Provide education on venous insufficiency Notes: ` Wound/Skin Impairment Nursing Diagnoses: Impaired tissue integrity Knowledge deficit related to smoking impact on wound healing Knowledge deficit related to ulceration/compromised skin integrity Goals: Ulcer/skin breakdown will have a volume reduction of 30% by week 4 Date Initiated: 01/16/2016 Target Resolution Date: 03/11/2016 Goal Status: Active Ulcer/skin breakdown will have a volume reduction of 50% by week 8 Date Initiated: 01/16/2016 Target Resolution Date: 03/11/2016 Goal Status: Active Ulcer/skin breakdown will have a volume reduction of 80% by week 12 Date Initiated: 01/16/2016 Target Resolution Date: 03/11/2016 Goal Status: Active Interventions: Assess patient/caregiver ability to perform ulcer/skin care regimen upon admission and as needed Assess ulceration(s) every visit Provide education on smoking Provide education on ulcer and skin care Notes: Electronic Signature(s) Signed: 02/07/2016 4:32:35 PM By: Alric Quan Entered By: Alric Quan on 02/07/2016 15:54:29 Emond, Fredrico P. ([id number]) Song, Arafat P.  ([id number]) -------------------------------------------------------------------------------- Pain Assessment Details Patient Name: Preis, Serge P. Date of Service: 02/07/2016 3:30 PM Medical Record Number: [id number] Patient Account Number: [id number] Date of Birth/Sex: 1965/04/24 (51 y.o. Male) Treating RN: Carolyne Fiscal, Debi Primary Care : PATIENT, NO Other Clinician: Referring : Loletha Grayer Treating /Extender: Frann Rider in Treatment: 3 Active Problems Location of Pain Severity and Description of Pain Patient Has Paino Yes Site Locations With Dressing Change: Yes Rate the pain. Current Pain Level: 3 Character of Pain Describe the Pain: Aching, Burning Pain Management and Medication Current Pain Management: Electronic Signature(s) Signed: 02/07/2016 4:32:35 PM By: Alric Quan Entered By: Alric Quan on 02/07/2016 15:39:57 Hakanson, Toben P. ([id number]) -------------------------------------------------------------------------------- Patient/Caregiver Education Details Patient Name: Schmierer, Matther P. Date of Service: 02/07/2016 3:30 PM Medical Record Number: [id number] Patient Account Number: [id number] Date of Birth/Gender: February 26, 1965 (51 y.o. Male) Treating RN: Carolyne Fiscal, Debi Primary Care Physician: PATIENT, NO Other Clinician: Referring Physician: Loletha Grayer Treating Physician/Extender: Frann Rider in Treatment: 3 Education Assessment Education Provided To: Patient Education Topics Provided Wound/Skin Impairment: Handouts: Other: do not get wrap wet Methods: Demonstration, Explain/Verbal Responses: State content correctly Electronic Signature(s) Signed: 02/07/2016 4:32:35 PM By: Alric Quan Entered By: Alric Quan on 02/07/2016 16:06:30 Robak, Brycen P. ([id number]) -------------------------------------------------------------------------------- Wound Assessment Details Patient Name: Tokarczyk,  Eleuterio P. Date of Service: 02/07/2016 3:30 PM Medical Record Number: [id number] Patient Account Number: [id number] Date of Birth/Sex: 06-Apr-1965 (51 y.o. Male) Treating RN: Carolyne Fiscal, Debi Primary Care : PATIENT, NO Other Clinician: Referring : Loletha Grayer Treating /Extender: Frann Rider in Treatment: 3 Wound Status Wound Number: 1 Primary Venous Leg Ulcer Etiology: Wound Location: Left Lower Leg - Anterior Wound Open Wounding Event: Gradually Appeared Status: Date Acquired: 12/26/2015 Comorbid Chronic Obstructive Pulmonary Disease Weeks Of Treatment: 3 History: (COPD), Sleep Apnea, Congestive Clustered Wound: No Heart Failure, Coronary Artery Disease, Seizure Disorder Wound Measurements Length: (cm) 3.3 Width: (  cm) 4.3 Depth: (cm) 0.1 Area: (cm) 11.145 Volume: (cm) 1.114 % Reduction in Area: 57% % Reduction in Volume: 57% Epithelialization: Small (1-33%) Tunneling: No Undermining: No Wound Description Classification: Partial Thickness Wound Margin: Flat and Intact Exudate Amount: Large Exudate Type: Serous Exudate Color: amber Foul Odor After Cleansing: No Slough/Fibrino Yes Wound Bed Granulation Amount: Medium (34-66%) Exposed Structure Granulation Quality: Pink Fascia Exposed: No Necrotic Amount: Medium (34-66%) Fat Layer (Subcutaneous Tissue) Exposed: No Necrotic Quality: Adherent Slough Tendon Exposed: No Muscle Exposed: No Joint Exposed: No Bone Exposed: No Limited to Skin Breakdown Periwound Skin Texture Texture Color No Abnormalities Noted: No No Abnormalities Noted: No Callus: No Atrophie Blanche: No Crepitus: No Cyanosis: No Bertsch, Wilmer P. ([id number]) Excoriation: No Ecchymosis: No Induration: No Erythema: No Rash: No Hemosiderin Staining: No Scarring: No Mottled: No Pallor: No Moisture Rubor: No No Abnormalities Noted: No Dry / Scaly: No Temperature / Pain Maceration: No Temperature: No  Abnormality Tenderness on Palpation: Yes Wound Preparation Ulcer Cleansing: Other: soap and water, Topical Anesthetic Applied: Other: lidocaine 4%, Treatment Notes Wound #1 (Left, Anterior Lower Leg) 1. Cleansed with: Clean wound with Normal Saline Cleanse wound with antibacterial soap and water 2. Anesthetic Topical Lidocaine 4% cream to wound bed prior to debridement 4. Dressing Applied: Aquacel Ag 5. Secondary Dressing Applied ABD Pad 7. Secured with Tape 3 Layer Compression System - Left Lower Extremity Electronic Signature(s) Signed: 02/07/2016 4:32:35 PM By: Alric Quan Entered By: Alric Quan on 02/07/2016 15:52:48 Schneider, Karsyn P. ([id number]) -------------------------------------------------------------------------------- Wound Assessment Details Patient Name: Crochet, Tadarrius P. Date of Service: 02/07/2016 3:30 PM Medical Record Number: [id number] Patient Account Number: [id number] Date of Birth/Sex: 1965/02/07 (51 y.o. Male) Treating RN: Carolyne Fiscal, Debi Primary Care Shawni Volkov: PATIENT, NO Other Clinician: Referring Candis Kabel: Loletha Grayer Treating Sabriya Yono/Extender: Frann Rider in Treatment: 3 Wound Status Wound Number: 2 Primary Venous Leg Ulcer Etiology: Wound Location: Left Lower Leg - Posterior Wound Open Wounding Event: Gradually Appeared Status: Date Acquired: 12/26/2015 Comorbid Chronic Obstructive Pulmonary Disease Weeks Of Treatment: 3 History: (COPD), Sleep Apnea, Congestive Clustered Wound: No Heart Failure, Coronary Artery Disease, Seizure Disorder Wound Measurements Length: (cm) 2 Width: (cm) 2 Depth: (cm) 0.1 Area: (cm) 3.142 Volume: (cm) 0.314 % Reduction in Area: 96.4% % Reduction in Volume: 96.4% Epithelialization: Large (67-100%) Tunneling: No Undermining: No Wound Description Classification: Partial Thickness Wound Margin: Flat and Intact Exudate Amount: Large Exudate Type: Serous Exudate Color:  amber Foul Odor After Cleansing: No Slough/Fibrino No Wound Bed Granulation Amount: Large (67-100%) Exposed Structure Granulation Quality: Red Fascia Exposed: No Necrotic Amount: None Present (0%) Fat Layer (Subcutaneous Tissue) Exposed: No Tendon Exposed: No Muscle Exposed: No Joint Exposed: No Bone Exposed: No Limited to Skin Breakdown Periwound Skin Texture Texture Color No Abnormalities Noted: No No Abnormalities Noted: No Callus: No Atrophie Blanche: No Crepitus: No Cyanosis: No Sainz, Aimar P. ([id number]) Excoriation: No Ecchymosis: No Induration: No Erythema: No Rash: No Hemosiderin Staining: No Scarring: No Mottled: No Pallor: No Moisture Rubor: No No Abnormalities Noted: No Dry / Scaly: No Temperature / Pain Maceration: No Temperature: No Abnormality Tenderness on Palpation: Yes Wound Preparation Ulcer Cleansing: Other: soap and water, Topical Anesthetic Applied: Other: lidocaine 4%, Treatment Notes Wound #2 (Left, Posterior Lower Leg) 1. Cleansed with: Clean wound with Normal Saline Cleanse wound with antibacterial soap and water 2. Anesthetic Topical Lidocaine 4% cream to wound bed prior to debridement 4. Dressing Applied: Aquacel Ag 5. Secondary Dressing Applied ABD Pad 7. Secured with Tape 3 Layer Compression  System - Left Lower Extremity Electronic Signature(s) Signed: 02/07/2016 4:32:35 PM By: Alric Quan Entered By: Alric Quan on 02/07/2016 15:53:36 Thompson, Elijah P. ([id number]) -------------------------------------------------------------------------------- Vitals Details Patient Name: Matlock, Saahil P. Date of Service: 02/07/2016 3:30 PM Medical Record Number: [id number] Patient Account Number: [id number] Date of Birth/Sex: [birth year] (51 y.o. Male) Treating RN: Carolyne Fiscal, Debi Primary Care : PATIENT, NO Other Clinician: Referring : Loletha Grayer Treating /Extender: Frann Rider in  Treatment: 3 Vital Signs Time Taken: 15:40 Temperature (F): 98.1 Height (in): 69 Pulse (bpm): 93 Weight (lbs): 316 Respiratory Rate (breaths/min): 18 Body Mass Index (BMI): 46.7 Blood Pressure (mmHg): 119/54 Reference Range: 80 - 120 mg / dl Electronic Signature(s) Signed: 02/07/2016 4:32:35 PM By: Alric Quan Entered By: Alric Quan on 02/07/2016 15:41:13

## 2016-02-08 NOTE — Progress Notes (Signed)
MAXAMUS, PERIC ([id number]) Visit Report for 02/07/2016 Chief Complaint Document Details Patient Name: [name] [name] [name] [name]. Date of Service: 02/07/2016 3:30 PM Medical Record Number: [id number] Patient Account Number: [id number] Date of Birth/Sex: 27-Jan-1965 (51 y.o. Male) Treating RN: [name] [name], [name] Primary Care Provider: PATIENT, NO Other Clinician: Referring Provider: Loletha [name] Treating Provider/Extender: [name] [name] in Treatment: 3 Information Obtained from: Patient Chief Complaint Patient presents to the wound care center for a consult due non healing wound for bilateral lower extremity edema which she's had for several months Electronic Signature(s) Signed: 02/07/2016 4:01:39 PM By: [name] Fudge MD, FACS Entered By: [name] [name] on 02/07/2016 16:01:39 [name] [name] [name]. ([id number]) -------------------------------------------------------------------------------- HPI Details Patient Name: [name] [name] [name]. Date of Service: 02/07/2016 3:30 PM Medical Record Number: [id number] Patient Account Number: [id number] Date of Birth/Sex: Dec 15, 1965 (51 y.o. Male) Treating RN: [name] [name], [name] Primary Care Provider: PATIENT, NO Other Clinician: Referring Provider: Loletha [name] Treating Provider/Extender: [name] [name] in Treatment: 3 History of Present Illness Location: a lateral lower extremity edema with swelling and infection of the left lower extremity Quality: Patient reports experiencing a sharp pain to affected area(s). Severity: Patient states wound are getting worse. Duration: Patient has had the wound for > 3 months prior to seeking treatment at the wound center Timing: Pain in wound is constant (hurts all the time) Context: The wound appeared gradually over time Modifying Factors: Consults to this date include:was admitted to the hospital for IV antibiotics and treatment Associated Signs and Symptoms: Patient reports having increase discharge. HPI  Description: 51 year old gentleman who is a current every day smoker and smokes a pack of cigarettes a day for the last 30 years also is known to have had a recent admission to the hospital between December 25 and 01/09/2016 and was admitted for swelling and cellulitis of the left lower extremity. he has a past medical history of COPD, coronary artery disease, hypertension, peptic ulcer and sleep apnea. His admission he was known to have cellulitis the left lower extremity and bilateral lower extremity lymphedema, and was placed on aggressive antibiotics( vancomycin and Zosyn) and Unna boot and change to Ancef later. He was also treated for COPD, essential hypertension and nicotine abuse. An echo done and showed a normal ejection fraction and was started on low-dose Lasix. he was discharged home on Keflex. during the admission of a duplex study was done for possible DVT and there was no evidence of this found in the left lower extremity. 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 In the process of finding a PCP here. 01/23/2016 -- his vascular workup is later this month and his PCP appointment is not too early February. 02/07/2016 -- venous duplex study was done yesterday and has his arterial workup to be done this coming Tuesday. Electronic Signature(s) Signed: 02/07/2016 4:02:02 PM By: [name] Fudge MD, FACS Entered By: [name] [name] on 02/07/2016 16:02:02 [name] [name] ([id number]) -------------------------------------------------------------------------------- Physical Exam Details Patient Name: [name] [name] [name]. Date of Service: 02/07/2016 3:30 PM Medical Record Number: [id number] Patient Account Number: [id number] Date of Birth/Sex: 09/13/1965 (51 y.o. Male) Treating RN: [name] [name] Primary Care Provider: PATIENT, NO Other Clinician: Referring Provider: Loletha [name] Treating Provider/Extender: [name] [name]  in Treatment: 3 Constitutional . Pulse regular. Respirations normal and unlabored. Afebrile. . Eyes Nonicteric. Reactive to light. Ears, Nose, Mouth, and Throat Lips, teeth, and gums WNL.Marland Kitchen Moist mucosa without lesions. Neck supple and nontender. No palpable  supraclavicular or cervical adenopathy. Normal sized without goiter. Respiratory WNL. No retractions.. Breath sounds WNL, No rubs, rales, rhonchi, or wheeze.. Cardiovascular Heart rhythm and rate regular, no murmur or gallop.. Pedal Pulses WNL. No clubbing, cyanosis or edema. Chest Breasts symmetical and no nipple discharge.. Breast tissue WNL, no masses, lumps, or tenderness.. Lymphatic No adneopathy. No adenopathy. No adenopathy. Musculoskeletal Adexa without tenderness or enlargement.. Digits and nails w/o clubbing, cyanosis, infection, petechiae, ischemia, or inflammatory conditions.. Integumentary (Hair, Skin) No suspicious lesions. No crepitus or fluctuance. No peri-wound warmth or erythema. No masses.Marland Kitchen Psychiatric Judgement and insight Intact.. No evidence of depression, anxiety, or agitation.. Notes the lymphedema continues to improve but he still has open ulceration and minimal wheezing from the left lower extremity Electronic Signature(s) Signed: 02/07/2016 4:02:25 PM By: [name] Fudge MD, FACS Entered By: [name] [name] on 02/07/2016 16:02:24 [name] [name] ([id number]) -------------------------------------------------------------------------------- Physician Orders Details Patient Name: [name] [name] [name]. Date of Service: 02/07/2016 3:30 PM Medical Record Number: [id number] Patient Account Number: [id number] Date of Birth/Sex: Sep 28, 1965 (51 y.o. Male) Treating RN: [name] [name], [name] Primary Care Provider: PATIENT, NO Other Clinician: Referring Provider: Loletha [name] Treating Provider/Extender: [name] [name] in Treatment: 3 Verbal / Phone Orders: Yes Clinician: Pinkerton, [name] Read Back and Verified:  Yes Diagnosis Coding Wound Cleansing Wound #1 Left,Anterior Lower Leg o Clean wound with Normal Saline. o Cleanse wound with mild soap and water - HHRN to clean with soap and water when changing wraps. Wound #2 Left,Posterior Lower Leg o Clean wound with Normal Saline. o Cleanse wound with mild soap and water - HHRN to clean with soap and water when changing wraps. Primary Wound Dressing Wound #1 Left,Anterior Lower Leg o Aquacel Ag - or Equal Wound #2 Left,Posterior Lower Leg o Aquacel Ag - or Equal Secondary Dressing Wound #1 Left,Anterior Lower Leg o ABD pad Wound #2 Left,Posterior Lower Leg o ABD pad Dressing Change Frequency Wound #1 Left,Anterior Lower Leg o Three times weekly - HHRN to change Mondays and Wednesdays. Pt will come to wound care clinic on Fridays to have it changed there. Wound #2 Left,Posterior Lower Leg o Three times weekly - HHRN to change Mondays and Wednesdays. Pt will come to wound care clinic on Fridays to have it changed there. Follow-up Appointments Wound #1 Left,Anterior Lower Leg [name] [name] [name]. ([id number]) o Return Appointment in 1 week. Wound #2 Left,Posterior Lower Leg o Return Appointment in 1 week. Edema Control Wound #1 Left,Anterior Lower Leg o 3 Layer Compression System - Left Lower Extremity - Wrap from base of toes to 3cm below knee. o Elevate legs to the level of the heart and pump ankles as often as possible Wound #2 Left,Posterior Lower Leg o 3 Layer Compression System - Left Lower Extremity - Wrap from base of toes to 3cm below knee. o Elevate legs to the level of the heart and pump ankles as often as possible Additional Orders / Instructions Wound #1 Left,Anterior Lower Leg o Stop Smoking o Increase protein intake. Wound #2 Left,Posterior Lower Leg o Stop Smoking o Increase protein intake. Home Health Wound #1 [name] [name] to  change Mondays and Wednesdays. Pt will come to wound care clinic on Fridays to have it changed there. o Home Health Nurse may visit PRN to address patientos wound care needs. o FACE TO FACE ENCOUNTER: MEDICARE and MEDICAID PATIENTS: I certify that this patient is under my care and that I had a face-to-face encounter that meets the  physician face-to-face encounter requirements with this patient on this date. The encounter with the patient was in whole or in part for the following MEDICAL CONDITION: (primary reason for [place]) MEDICAL NECESSITY: I certify, that based on my findings, NURSING services are a medically necessary home health service. HOME BOUND STATUS: I certify that my clinical findings support that this patient is homebound (i.e., Due to illness or injury, pt requires aid of supportive devices such as crutches, cane, wheelchairs, walkers, the use of special transportation or the assistance of another person to leave their place of residence. There is a normal inability to leave the home and doing so requires considerable and taxing effort. Other absences are for medical reasons / religious services and are infrequent or of short duration when for other reasons). o If current dressing causes regression in wound condition, may D/C ordered dressing product/s and apply Normal Saline Moist Dressing daily until next [place] / Other MD appointment. [place] of regression in wound condition at [phone]. o Please direct any NON-WOUND related issues/requests for orders to patient's Primary Care Physician Wound #2 Left,Posterior Lower Leg [name] [name] [name] [name] ([id number]) o [place] [facility]its - [facility] to change Mondays and Wednesdays. Pt will come to wound care clinic on Fridays to have it changed there. o Home Health Nurse may visit PRN to address patientos wound care needs. o FACE TO FACE ENCOUNTER: MEDICARE and MEDICAID  PATIENTS: I certify that this patient is under my care and that I had a face-to-face encounter that meets the physician face-to-face encounter requirements with this patient on this date. The encounter with the patient was in whole or in part for the following MEDICAL CONDITION: (primary reason for [place]) MEDICAL NECESSITY: I certify, that based on my findings, NURSING services are a medically necessary home health service. HOME BOUND STATUS: I certify that my clinical findings support that this patient is homebound (i.e., Due to illness or injury, pt requires aid of supportive devices such as crutches, cane, wheelchairs, walkers, the use of special transportation or the assistance of another person to leave their place of residence. There is a normal inability to leave the home and doing so requires considerable and taxing effort. Other absences are for medical reasons / religious services and are infrequent or of short duration when for other reasons). o If current dressing causes regression in wound condition, may D/C ordered dressing product/s and apply Normal Saline Moist Dressing daily until next [place] / Other MD appointment. [place] of regression in wound condition at [phone]. o Please direct any NON-WOUND related issues/requests for orders to patient's Primary Care Physician Medications-please add to medication list. Wound #1 Left,Anterior Lower Leg o [name].O. Antibiotics - Complete Doxycycline o Other: - Vitamin C, Zinc, Multivitamin Wound #2 Left,Posterior Lower Leg o [name].O. Antibiotics - Complete Doxycycline o Other: - Vitamin C, Zinc, Multivitamin Electronic Signature(s) Signed: 02/07/2016 4:12:12 PM By: [name] Fudge MD, FACS Signed: 02/07/2016 4:32:35 PM By: Alric Quan Entered By: Alric Quan on 02/07/2016 16:00:24 [name] [name] [name].  ([id number]) -------------------------------------------------------------------------------- Problem List Details Patient Name: Cunningham, Teshaun [name]. Date of Service: 02/07/2016 3:30 PM Medical Record Number: [id number] Patient Account Number: [id number] Date of Birth/Sex: [birth year] (51 y.o. 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 ([id number]) -------------------------------------------------------------------------------- Progress Note Details Patient Name: Vincent, [name] [name]. Date of Service: 02/07/2016 3:30 PM Medical Record Number: [id number] Patient Account Number: [id number] Date of Birth/Sex: [birth year] (51 y.o. Male) Treating RN: [name] [name], [name] Primary Care Provider: PATIENT, NO Other Clinician: Referring Provider: Loletha [name] Treating Provider/Extender: [name] [name] in Treatment: 3 Subjective Chief Complaint Information obtained from Patient Patient presents to the wound care center for a consult due non healing wound for bilateral lower extremity edema which she's had for several months History of Present Illness (HPI) The following HPI elements were documented for the patient's wound: Location: a lateral lower  extremity edema with swelling and infection of the left lower extremity Quality: Patient reports experiencing a sharp pain to affected area(s). Severity: Patient states wound are getting worse. Duration: Patient has had the wound for > 3 months prior to seeking treatment at the wound center Timing: Pain in wound is constant (hurts all the time) Context: The wound appeared gradually over time Modifying Factors: Consults to this date include:was admitted to the hospital for IV antibiotics and treatment Associated Signs and Symptoms: Patient reports having increase discharge. 51 year old gentleman who is a current every day smoker and smokes a pack of cigarettes a day for the last 30 years also is known to have had a recent admission to the hospital between December 25 and 01/09/2016 and was admitted for swelling and cellulitis of the left lower extremity. he has a past medical history of COPD, coronary artery disease, hypertension, peptic ulcer and sleep apnea. His admission he was known to have cellulitis the left lower extremity and bilateral lower extremity lymphedema, and was placed on aggressive antibiotics( vancomycin and Zosyn) and Unna boot and change to Ancef later. He was also treated for COPD, essential hypertension and nicotine abuse. An echo done and showed a normal ejection fraction and was started on low-dose Lasix. he was discharged home on Keflex. during the admission of a duplex study was done for possible DVT and there was no evidence of this found in the left lower extremity. 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 In the process of finding a PCP here. 01/23/2016 -- his vascular workup is later this month and his PCP appointment is not too early February. 02/07/2016 -- venous duplex study was done yesterday and has his arterial workup to be done this coming Tuesday. [name] [name] [name] [name]  ([id number]) Objective Constitutional Pulse regular. Respirations normal and unlabored. Afebrile. Vitals Time Taken: 3:40 PM, Height: 69 in, Weight: 316 lbs, BMI: 46.7, Temperature: 98.1 F, Pulse: 93 bpm, Respiratory Rate: 18 breaths/min, Blood Pressure: 119/54 mmHg. Eyes Nonicteric. Reactive to light. Ears, Nose, Mouth, and Throat Lips, teeth, and gums WNL.Marland Kitchen Moist mucosa without lesions. Neck supple and nontender. No palpable supraclavicular or cervical adenopathy. Normal sized without goiter. Respiratory WNL. No retractions.. Breath sounds WNL, No rubs, rales, rhonchi, or wheeze.. Cardiovascular Heart rhythm and rate regular, no murmur or gallop.. Pedal Pulses WNL. No clubbing, cyanosis or edema. Chest Breasts symmetical and no nipple discharge.. Breast tissue WNL, no  masses, lumps, or tenderness.. Lymphatic No adneopathy. No adenopathy. No adenopathy. Musculoskeletal Adexa without tenderness or enlargement.. Digits and nails w/o clubbing, cyanosis, infection, petechiae, ischemia, or inflammatory conditions.Marland Kitchen Psychiatric Judgement and insight Intact.. No evidence of depression, anxiety, or agitation.. General Notes: the lymphedema continues to improve but he still has open ulceration and minimal wheezing from the left lower extremity Integumentary (Hair, Skin) No suspicious lesions. No crepitus or fluctuance. No peri-wound warmth or erythema. No masses.. Wound #1 status is Open. Original cause of wound was Gradually Appeared. The wound is located on the Left,Anterior Lower Leg. The wound measures 3.3cm length x 4.3cm width x 0.1cm depth; 11.145cm^2 area and 1.114cm^3 volume. The wound is limited to skin breakdown. There is no tunneling or undermining noted. There is a large amount of serous drainage noted. The wound margin is flat and intact. There is medium (34-66%) pink granulation within the wound bed. There is a medium (34-66%) amount of necrotic Dougal, Ivie [name].  ([id number]) tissue within the wound bed including Adherent Slough. The periwound skin appearance did not exhibit: Callus, Crepitus, Excoriation, Induration, Rash, Scarring, Dry/Scaly, Maceration, Atrophie Blanche, Cyanosis, Ecchymosis, Hemosiderin Staining, Mottled, Pallor, Rubor, Erythema. Periwound temperature was noted as No Abnormality. The periwound has tenderness on palpation. Wound #2 status is Open. Original cause of wound was Gradually Appeared. The wound is located on the Left,Posterior Lower Leg. The wound measures 2cm length x 2cm width x 0.1cm depth; 3.142cm^2 area and 0.314cm^3 volume. The wound is limited to skin breakdown. There is no tunneling or undermining noted. There is a large amount of serous drainage noted. The wound margin is flat and intact. There is large (67-100%) red granulation within the wound bed. There is no necrotic tissue within the wound bed. The periwound skin appearance did not exhibit: Callus, Crepitus, Excoriation, Induration, Rash, Scarring, Dry/Scaly, Maceration, Atrophie Blanche, Cyanosis, Ecchymosis, Hemosiderin Staining, Mottled, Pallor, Rubor, Erythema. Periwound temperature was noted as No Abnormality. The periwound has tenderness on palpation. Assessment Active Problems ICD-10 I89.0 - Lymphedema, not elsewhere classified L97.222 - Non-pressure chronic ulcer of left calf with fat layer exposed E66.01 - Morbid (severe) obesity due to excess calories F17.218 - Nicotine dependence, cigarettes, with other nicotine-induced disorders L03.116 - Cellulitis of left lower limb Plan Wound Cleansing: Wound #1 Left,Anterior Lower Leg: Clean wound with Normal Saline. Cleanse wound with mild soap and water - HHRN to clean with soap and water when changing wraps. Wound #2 Left,Posterior Lower Leg: Clean wound with Normal Saline. Cleanse wound with mild soap and water - HHRN to clean with soap and water when changing wraps. Primary Wound Dressing: Wound #1  Left,Anterior Lower Leg: Aquacel Ag - or Equal Wound #2 Left,Posterior Lower Leg: Aquacel Ag - or Equal Secondary Dressing: Wound #1 Left,Anterior Lower Leg: Stemler, Samaj [name]. ([id number]) ABD pad Wound #2 Left,Posterior Lower Leg: ABD pad Dressing Change Frequency: Wound #1 Left,Anterior Lower Leg: Three times weekly - HHRN to change Mondays and Wednesdays. Pt will come to wound care clinic on Fridays to have it changed there. Wound #2 Left,Posterior Lower Leg: Three times weekly - HHRN to change Mondays and Wednesdays. Pt will come to wound care clinic on Fridays to have it changed there. Follow-up Appointments: Wound #1 Left,Anterior Lower Leg: Return Appointment in 1 week. Wound #2 Left,Posterior Lower Leg: Return Appointment in 1 week. Edema Control: Wound #1 Left,Anterior Lower Leg: 3 Layer Compression System - Left Lower Extremity - Wrap from base of toes to 3cm below knee. Elevate legs to  the level of the heart and pump ankles as often as possible Wound #2 Left,Posterior Lower Leg: 3 Layer Compression System - Left Lower Extremity - Wrap from base of toes to 3cm below knee. Elevate legs to the level of the heart and pump ankles as often as possible Additional Orders / Instructions: Wound #1 Left,Anterior Lower Leg: Stop Smoking Increase protein intake. Wound #2 Left,Posterior Lower Leg: Stop Smoking Increase protein intake. Home Health: Wound #1 Left,Anterior Lower Leg: Continue Home Health Visits - [facility] to change Mondays and Wednesdays. Pt will come to wound care clinic on Fridays to have it changed there. Home Health Nurse may visit PRN to address patient s wound care needs. FACE TO FACE ENCOUNTER: MEDICARE and MEDICAID PATIENTS: I certify that this patient is under my care and that I had a face-to-face encounter that meets the physician face-to-face encounter requirements with this patient on this date. The encounter with the patient was in whole or in part for  the following MEDICAL CONDITION: (primary reason for [place]) MEDICAL NECESSITY: I certify, that based on my findings, NURSING services are a medically necessary home health service. HOME BOUND STATUS: I certify that my clinical findings support that this patient is homebound (i.e., Due to illness or injury, pt requires aid of supportive devices such as crutches, cane, wheelchairs, walkers, the use of special transportation or the assistance of another person to leave their place of residence. There is a normal inability to leave the home and doing so requires considerable and taxing effort. Other absences are for medical reasons / religious services and are infrequent or of short duration when for other reasons). If current dressing causes regression in wound condition, may D/C ordered dressing product/s and apply Normal Saline Moist Dressing daily until next [place] / Other MD appointment. [place] of regression in wound condition at [phone]. Please direct any NON-WOUND related issues/requests for orders to patient's Primary Care Physician Wound #2 Left,Posterior Lower Leg: [place] Visits - [facility] to change Mondays and Wednesdays. Pt will come to wound care clinic on Fridays to have it changed there. TYRRELL, PASZTOR ([id number]) Home Health Nurse may visit PRN to address patient s wound care needs. FACE TO FACE ENCOUNTER: MEDICARE and MEDICAID PATIENTS: I certify that this patient is under my care and that I had a face-to-face encounter that meets the physician face-to-face encounter requirements with this patient on this date. The encounter with the patient was in whole or in part for the following MEDICAL CONDITION: (primary reason for [place]) MEDICAL NECESSITY: I certify, that based on my findings, NURSING services are a medically necessary home health service. HOME BOUND STATUS: I certify that my clinical findings support  that this patient is homebound (i.e., Due to illness or injury, pt requires aid of supportive devices such as crutches, cane, wheelchairs, walkers, the use of special transportation or the assistance of another person to leave their place of residence. There is a normal inability to leave the home and doing so requires considerable and taxing effort. Other absences are for medical reasons / religious services and are infrequent or of short duration when for other reasons). If current dressing causes regression in wound condition, may D/C ordered dressing product/s and apply Normal Saline Moist Dressing daily until next [place] / Other MD appointment. [place] of regression in wound condition at [phone]. Please direct any NON-WOUND related issues/requests for orders to patient's Primary Care Physician  Medications-please add to medication list.: Wound #1 Left,Anterior Lower Leg: [name].O. Antibiotics - Complete Doxycycline Other: - Vitamin C, Zinc, Multivitamin Wound #2 Left,Posterior Lower Leg: [name].O. Antibiotics - Complete Doxycycline Other: - Vitamin C, Zinc, Multivitamin I have recommended: 1. Silver alginate, DrawTex and a 3 layer Profore wrap to the left lower extremity. 2. arterial duplex and venous duplex studies for reflux both lower extremities -- reports pending 3. If he is going to be following up here he needs to find a PCP to help control his medical issues -- appointment in first week of February 4. again discussed the need to completely give up smoking have discussed the risks benefits and alternatives and all the possible complications 5. Regular visits to the wound center Electronic Signature(s) Signed: 02/07/2016 4:02:56 PM By: [name] Fudge MD, FACS Entered By: [name] [name] on 02/07/2016 16:02:56 Madore, Cy [name]. ([id number]) -------------------------------------------------------------------------------- SuperBill Details Patient Name:  Macwilliams, Eaden [name]. Date of Service: 02/07/2016 Medical Record Number: [id number] Patient Account Number: [id number] Date of Birth/Sex: Sep 09, 1965 (52 y.o. Male) Treating RN: [name] [name], [name] Primary Care Provider: PATIENT, NO Other Clinician: Referring Provider: Loletha [name] Treating Provider/Extender: [name] [name] in Treatment: 3 Diagnosis Coding ICD-10 Codes Code Description I89.0 Lymphedema, not elsewhere classified L97.222 Non-pressure chronic ulcer of left calf with fat layer exposed E66.01 Morbid (severe) obesity due to excess calories F17.218 Nicotine dependence, cigarettes, with other nicotine-induced disorders L03.116 Cellulitis of left lower limb Physician Procedures CPT4 Code Description: [id number] - WC PHYS LEVEL 3 - EST PT ICD-10 Description Diagnosis I89.0 Lymphedema, not elsewhere classified L97.222 Non-pressure chronic ulcer of left calf with fat E66.01 Morbid (severe) obesity due to excess calories F17.218  Nicotine dependence, cigarettes, with other nicot Modifier: layer exposed ine-induced di Quantity: 1 sorders Electronic Signature(s) Signed: 02/07/2016 4:03:12 PM By: [name] Fudge MD, FACS Entered By: [name] [name] on 02/07/2016 16:03:12

## 2016-02-11 ENCOUNTER — Ambulatory Visit (INDEPENDENT_AMBULATORY_CARE_PROVIDER_SITE_OTHER): Payer: BLUE CROSS/BLUE SHIELD

## 2016-02-11 ENCOUNTER — Ambulatory Visit (INDEPENDENT_AMBULATORY_CARE_PROVIDER_SITE_OTHER): Payer: BLUE CROSS/BLUE SHIELD | Admitting: Vascular Surgery

## 2016-02-11 ENCOUNTER — Other Ambulatory Visit: Payer: Self-pay | Admitting: Surgery

## 2016-02-11 ENCOUNTER — Encounter (INDEPENDENT_AMBULATORY_CARE_PROVIDER_SITE_OTHER): Payer: Self-pay

## 2016-02-11 ENCOUNTER — Encounter (INDEPENDENT_AMBULATORY_CARE_PROVIDER_SITE_OTHER): Payer: Self-pay | Admitting: Vascular Surgery

## 2016-02-11 VITALS — BP 160/97 | HR 87 | Resp 18 | Ht 69.0 in | Wt 322.6 lb

## 2016-02-11 DIAGNOSIS — L97222 Non-pressure chronic ulcer of left calf with fat layer exposed: Secondary | ICD-10-CM | POA: Diagnosis not present

## 2016-02-11 DIAGNOSIS — I890 Lymphedema, not elsewhere classified: Secondary | ICD-10-CM | POA: Diagnosis not present

## 2016-02-11 DIAGNOSIS — I10 Essential (primary) hypertension: Secondary | ICD-10-CM | POA: Diagnosis not present

## 2016-02-11 DIAGNOSIS — Z6841 Body Mass Index (BMI) 40.0 and over, adult: Secondary | ICD-10-CM

## 2016-02-11 DIAGNOSIS — F17200 Nicotine dependence, unspecified, uncomplicated: Secondary | ICD-10-CM | POA: Diagnosis not present

## 2016-02-11 DIAGNOSIS — L97322 Non-pressure chronic ulcer of left ankle with fat layer exposed: Secondary | ICD-10-CM | POA: Diagnosis not present

## 2016-02-11 DIAGNOSIS — E785 Hyperlipidemia, unspecified: Secondary | ICD-10-CM | POA: Diagnosis not present

## 2016-02-11 DIAGNOSIS — E669 Obesity, unspecified: Secondary | ICD-10-CM | POA: Insufficient documentation

## 2016-02-11 DIAGNOSIS — IMO0001 Reserved for inherently not codable concepts without codable children: Secondary | ICD-10-CM

## 2016-02-11 NOTE — Assessment & Plan Note (Signed)
We had a discussion for approximately 3-4 minutes regarding the absolute need for smoking cessation due to the deleterious nature of tobacco on the vascular system. We discussed the tobacco use would diminish patency of any intervention, and likely significantly worsen progressio of disease. We discussed multiple agents for quitting including replacement therapy or medications to reduce cravings such as Chantix. The patient voices their understanding of the importance of smoking cessation.  

## 2016-02-11 NOTE — Assessment & Plan Note (Signed)
To evaluate his blood flow for wound healing both arterial and venous studies have been performed in our office recently. A venous duplex was performed which demonstrated no evidence of deep venous thrombosis, superficial thrombophlebitis, or significant superficial venous reflux in either lower extremity. ABIs were performed today which demonstrated normal triphasic waveforms throughout both lower extremities with a right ABI of 1.06 and a left ABI of 1.13 consistent with no arterial insufficiency. It does not appear that vascular causes are likely a primary issue for his nonhealing wound. Continue to get the excellent local wound care he has been receiving

## 2016-02-11 NOTE — Assessment & Plan Note (Signed)
Discussed that weight loss would be very beneficial for wound healing and leg swelling.

## 2016-02-11 NOTE — Progress Notes (Signed)
Patient ID: [name], male   DOB: Sep 13, 1965, 51 y.o.   MRN: [id number]  Chief Complaint  Patient presents with  . New Patient (Initial Visit)    HPI [name] is a 51 y.o. male.  I am asked to see the patient by Dr. Con Memos for evaluation of non-healing wounds.  The patient reports Bilateral lower extremity swelling which has been present for years. He reports an episode of cellulitis and ulceration on the right leg a couple of years ago after trying to wear compression stockings. His ulcer this side is on his left calf and ankle area. This has been present for 2-3 months now. There was no clear inciting event or causative factor that started the symptoms. He has been getting excellent local wound care under the direction of the wound care center for about 4-5 weeks now. Nonetheless, the ulcer persists. It is locally painful and his legs are heavy and swollen pretty much all of the time. Despite the [employer], his leg is quite swollen today. He continues to smoke and is morbidly obese. He does not currently have any fevers or chills or signs systemic infection. To evaluate his blood flow for wound healing both arterial and venous studies have been performed in our office recently. A venous duplex was performed which demonstrated no evidence of deep venous thrombosis, superficial thrombophlebitis, or significant superficial venous reflux in either lower extremity. ABIs were performed today which demonstrated normal triphasic waveforms throughout both lower extremities with a right ABI of 1.06 and a left ABI of 1.13 consistent with no arterial insufficiency.   Past Medical History:  Diagnosis Date  . COPD (chronic obstructive pulmonary disease) ([place])   . Coronary artery disease   . Hypertension   . Peptic ulcer   . Sleep apnea     No past surgical history on file.  Family History  Problem Relation Age of Onset  . Diabetes Mother   . Diabetes Father   . Stomach cancer  Father   No bleeding or clotting disorders  Social History Social History  Substance Use Topics  . Smoking status: Current Every Day Smoker    Packs/day: 1.00    Years: 30.00    Types: Cigarettes  . Smokeless tobacco: Never Used  . Alcohol use No  No IVDU  Allergies  Allergen Reactions  . Shellfish Allergy Swelling  . Codeine Rash  . Lisinopril Cough    Current Outpatient Prescriptions  Medication Sig Dispense Refill  . aspirin 325 MG EC tablet Take 325 mg by mouth daily.    Marland Kitchen aspirin-acetaminophen-caffeine (EXCEDRIN MIGRAINE) 250-250-65 MG tablet Take by mouth every 6 (six) hours as needed for headache.    Marland Kitchen atorvastatin (LIPITOR) 20 MG tablet Take 20 mg by mouth daily.    Marland Kitchen buPROPion (WELLBUTRIN SR) 150 MG 12 hr tablet Take 150 mg by mouth 2 (two) times daily.    . Fluticasone-Salmeterol (ADVAIR DISKUS) 250-50 MCG/DOSE AEPB Inhale 1 puff into the lungs 2 (two) times daily.    . furosemide (LASIX) 40 MG tablet Take 1 tablet (40 mg total) by mouth daily. 30 tablet 0  . loratadine (CLARITIN) 10 MG tablet Take 10 mg by mouth daily.    Marland Kitchen losartan (COZAAR) 50 MG tablet Take 1 tablet (50 mg total) by mouth daily. 30 tablet 0  . nicotine (NICODERM CQ - DOSED IN MG/24 HOURS) 21 mg/24hr patch Place 1 patch (21 mg total) onto the skin daily. 28 patch 0  .  omeprazole (PRILOSEC) 20 MG capsule Take 20 mg by mouth 2 (two) times daily.    Marland Kitchen oxybutynin (DITROPAN-XL) 10 MG 24 hr tablet Take 10 mg by mouth at bedtime.    Marland Kitchen umeclidinium bromide (INCRUSE ELLIPTA) 62.5 MCG/INH AEPB Inhale 1 puff into the lungs daily.    . cephALEXin (KEFLEX) 500 MG capsule Take 1 capsule (500 mg total) by mouth 4 (four) times daily. (Patient not taking: Reported on 02/11/2016) 40 capsule 0  . oxyCODONE-acetaminophen (PERCOCET/ROXICET) 5-325 MG tablet Take 1 tablet by mouth every 6 (six) hours as needed for moderate pain. (Patient not taking: Reported on 02/11/2016) 15 tablet 0   No current facility-administered  medications for this visit.       REVIEW OF SYSTEMS (Negative unless checked)  Constitutional: [] Weight loss  [] Fever  [] Chills Cardiac: [] Chest pain   [] Chest pressure   [] Palpitations   [] Shortness of breath when laying flat   [] Shortness of breath at rest   [x] Shortness of breath with exertion. Vascular:  [] Pain in legs with walking   [x] Pain in legs at rest   [] Pain in legs when laying flat   [] Claudication   [] Pain in feet when walking  [] Pain in feet at rest  [] Pain in feet when laying flat   [] History of DVT   [] Phlebitis   [x] Swelling in legs   [] Varicose veins   [x] Non-healing ulcers Pulmonary:   [] Uses home oxygen   [] Productive cough   [] Hemoptysis   [] Wheeze  [x] COPD   [] Asthma Neurologic:  [] Dizziness  [] Blackouts   [] Seizures   [] History of stroke   [] History of TIA  [] Aphasia   [] Temporary blindness   [] Dysphagia   [] Weakness or numbness in arms   [] Weakness or numbness in legs Musculoskeletal:  [] Arthritis   [] Joint swelling   [x] Joint pain   [] Low back pain Hematologic:  [] Easy bruising  [] Easy bleeding   [] Hypercoagulable state   [] Anemic  [] Hepatitis Gastrointestinal:  [] Blood in stool   [] Vomiting blood  [] Gastroesophageal reflux/heartburn   [] Abdominal pain Genitourinary:  [] Chronic kidney disease   [] Difficult urination  [] Frequent urination  [] Burning with urination   [] Hematuria Skin:  [] Rashes   [x] Ulcers   [x] Wounds Psychological:  [] History of anxiety   []  History of major depression.    Physical Exam BP (!) 160/97   Pulse 87   Resp 18   Ht 5' 9"  (1.753 m)   Wt (!) 322 lb 9.6 oz (146.3 kg)   BMI 47.64 kg/m  Gen:  WD/WN, NAD. Morbidly obese Head: [place]/AT, No temporalis wasting. Prominent temp pulse not noted. Ear/Nose/Throat: Hearing grossly intact, nares w/o erythema or drainage, oropharynx w/o Erythema/Exudate Eyes: Conjunctiva clear, sclera non-icteric  Neck: trachea midline.  No JVD.  Pulmonary:  Good air movement, respirations not labored, no use of  accessory muscles.  Cardiac: RRR, normal S1, S2. Vascular:  Vessel Right Left  Radial Palpable Palpable  Ulnar Palpable Palpable  Brachial Palpable Palpable  Carotid Palpable, without bruit Palpable, without bruit  Aorta Not palpable N/A  Femoral Palpable Palpable  Popliteal Not Palpable Not Palpable  PT Not Palpable Not Palpable  DP Palpable 1+ Palpable   Gastrointestinal: soft, non-tender/non-distended. No guarding/reflex. No masses, surgical incisions, or scars. Musculoskeletal: M/S 5/5 throughout. Marked stasis changes are present on the right leg. Left leg is wrapped in an Haematologist.  No deformity or atrophy. 1-2+ right lower extremity edema, 3+ left lower extremity edema. Neurologic: Sensation grossly intact in extremities.  Symmetrical.  Speech is fluent.  Motor exam as listed above. Psychiatric: Judgment intact, Mood & affect appropriate for pt's clinical situation. Dermatologic: Wound left medial ankle currently dressed Lymph : No Cervical, Axillary, or Inguinal lymphadenopathy.   Radiology No results found.  Labs Recent Results (from the past 2160 hour(s))  CBC with Differential     Status: Abnormal   Collection Time: 01/06/16 12:05 PM  Result Value Ref Range   WBC 10.5 3.8 - 10.6 K/uL   RBC 5.39 4.40 - 5.90 MIL/uL   Hemoglobin 16.0 13.0 - 18.0 g/dL   HCT 46.4 40.0 - 52.0 %   MCV 86.2 80.0 - 100.0 fL   MCH 29.6 26.0 - 34.0 pg   MCHC 34.4 32.0 - 36.0 g/dL   RDW 14.6 (H) 11.5 - 14.5 %   Platelets 218 150 - 440 K/uL   Neutrophils Relative % 75 %   Neutro Abs 7.9 (H) 1.4 - 6.5 K/uL   Lymphocytes Relative 14 %   Lymphs Abs 1.5 1.0 - 3.6 K/uL   Monocytes Relative 7 %   Monocytes Absolute 0.8 0.2 - 1.0 K/uL   Eosinophils Relative 3 %   Eosinophils Absolute 0.3 0 - 0.7 K/uL   Basophils Relative 1 %   Basophils Absolute 0.1 0 - 0.1 K/uL  Basic metabolic panel     Status: Abnormal   Collection Time: 01/06/16 12:05 PM  Result Value Ref Range   Sodium 135 135 - 145  mmol/L   Potassium 4.5 3.5 - 5.1 mmol/L   Chloride 102 101 - 111 mmol/L   CO2 25 22 - 32 mmol/L   Glucose, Bld 110 (H) 65 - 99 mg/dL   BUN 17 6 - 20 mg/dL   Creatinine, Ser 0.96 0.61 - 1.24 mg/dL   Calcium 9.3 8.9 - 10.3 mg/dL   GFR calc non Af Amer >60 >60 mL/min   GFR calc Af Amer >60 >60 mL/min    Comment: (NOTE) The eGFR has been calculated using the CKD EPI equation. This calculation has not been validated in all clinical situations. eGFR's persistently <60 mL/min signify possible Chronic Kidney Disease.    Anion gap 8 5 - 15  Blood culture (routine x 2)     Status: None   Collection Time: 01/06/16 12:06 PM  Result Value Ref Range   Specimen Description BLOOD LEFT ANTECUBITAL    Special Requests      BOTTLES DRAWN AEROBIC AND ANAEROBIC  AER 10CC ANA [place]   Culture NO GROWTH 5 DAYS    Report Status 01/11/2016 FINAL   Blood culture (routine x 2)     Status: None   Collection Time: 01/06/16 12:25 PM  Result Value Ref Range   Specimen Description BLOOD RIGHT HAND    Special Requests      BOTTLES DRAWN AEROBIC AND ANAEROBIC  AER [place] ANA 11CC   Culture NO GROWTH 5 DAYS    Report Status 01/11/2016 FINAL   MRSA PCR Screening     Status: None   Collection Time: 01/06/16  2:18 PM  Result Value Ref Range   MRSA by PCR NEGATIVE NEGATIVE    Comment:        The GeneXpert MRSA Assay (FDA approved for NASAL specimens only), is one component of a comprehensive MRSA colonization surveillance program. It is not intended to diagnose MRSA infection nor to guide or monitor treatment for MRSA infections.   Basic metabolic panel     Status: Abnormal   Collection Time: 01/07/16  5:00 AM  Result Value Ref Range   Sodium 138 135 - 145 mmol/L   Potassium 4.8 3.5 - 5.1 mmol/L   Chloride 103 101 - 111 mmol/L   CO2 29 22 - 32 mmol/L   Glucose, Bld 113 (H) 65 - 99 mg/dL   BUN 14 6 - 20 mg/dL   Creatinine, Ser 1.15 0.61 - 1.24 mg/dL   Calcium 9.0 8.9 - 10.3 mg/dL   GFR calc non Af Amer  >60 >60 mL/min   GFR calc Af Amer >60 >60 mL/min    Comment: (NOTE) The eGFR has been calculated using the CKD EPI equation. This calculation has not been validated in all clinical situations. eGFR's persistently <60 mL/min signify possible Chronic Kidney Disease.    Anion gap 6 5 - 15  CBC     Status: Abnormal   Collection Time: 01/07/16  5:00 AM  Result Value Ref Range   WBC 9.2 3.8 - 10.6 K/uL   RBC 5.08 4.40 - 5.90 MIL/uL   Hemoglobin 14.8 13.0 - 18.0 g/dL   HCT 43.7 40.0 - 52.0 %   MCV 86.1 80.0 - 100.0 fL   MCH 29.1 26.0 - 34.0 pg   MCHC 33.7 32.0 - 36.0 g/dL   RDW 14.8 (H) 11.5 - 14.5 %   Platelets 209 150 - 440 K/uL  ECHOCARDIOGRAM COMPLETE     Status: None   Collection Time: 01/08/16  6:55 PM  Result Value Ref Range   Weight 5,056 oz   Height 69 in   BP 138/75 mmHg    Assessment/Plan:  Essential hypertension, benign blood pressure control important in reducing the progression of atherosclerotic disease. On appropriate oral medications.   Hyperlipidemia lipid control important in reducing the progression of atherosclerotic disease. Continue statin therapy   Tobacco dependence We had a discussion for approximately 3-4 minutes regarding the absolute need for smoking cessation due to the deleterious nature of tobacco on the vascular system. We discussed the tobacco use would diminish patency of any intervention, and likely significantly worsen progressio of disease. We discussed multiple agents for quitting including replacement therapy or medications to reduce cravings such as Chantix. The patient voices their understanding of the importance of smoking cessation.   Obesity Discussed that weight loss would be very beneficial for wound healing and leg swelling.  Lower limb ulcer, ankle, left, with fat layer exposed ([place]) To evaluate his blood flow for wound healing both arterial and venous studies have been performed in our office recently. A venous duplex was  performed which demonstrated no evidence of deep venous thrombosis, superficial thrombophlebitis, or significant superficial venous reflux in either lower extremity. ABIs were performed today which demonstrated normal triphasic waveforms throughout both lower extremities with a right ABI of 1.06 and a left ABI of 1.13 consistent with no arterial insufficiency. It does not appear that vascular causes are likely a primary issue for his nonhealing wound. Continue to get the excellent local wound care he has been receiving  Lymphedema I believe the patient has developed lymphedema from chronic scarring of lymphatic channels after his previous episodes of cellulitis and his nonhealing wound on the left leg. Once his wound has healed, I think considering him for a lymphedema pump would be a good adjuvant therapy. He needs to begin some sort of compression stockings as well as elevating his legs, losing weight, and increasing his exercise level. I will see him back in 2-3 months to reassess the situation and potentially plan a lymphedema pump.  Leotis Pain 02/11/2016, 11:03 AM   This note was created with Dragon medical transcription system.  Any errors from dictation are unintentional.

## 2016-02-11 NOTE — Assessment & Plan Note (Signed)
blood pressure control important in reducing the progression of atherosclerotic disease. On appropriate oral medications.  

## 2016-02-11 NOTE — Assessment & Plan Note (Signed)
I believe the patient has developed lymphedema from chronic scarring of lymphatic channels after his previous episodes of cellulitis and his nonhealing wound on the left leg. Once his wound has healed, I think considering him for a lymphedema pump would be a good adjuvant therapy. He needs to begin some sort of compression stockings as well as elevating his legs, losing weight, and increasing his exercise level. I will see him back in 2-3 months to reassess the situation and potentially plan a lymphedema pump.

## 2016-02-11 NOTE — Assessment & Plan Note (Signed)
lipid control important in reducing the progression of atherosclerotic disease. Continue statin therapy  

## 2016-02-13 ENCOUNTER — Encounter: Payer: BLUE CROSS/BLUE SHIELD | Attending: Surgery | Admitting: Surgery

## 2016-02-13 DIAGNOSIS — L03116 Cellulitis of left lower limb: Secondary | ICD-10-CM | POA: Diagnosis not present

## 2016-02-13 DIAGNOSIS — Z7982 Long term (current) use of aspirin: Secondary | ICD-10-CM | POA: Diagnosis not present

## 2016-02-13 DIAGNOSIS — I2510 Atherosclerotic heart disease of native coronary artery without angina pectoris: Secondary | ICD-10-CM | POA: Diagnosis not present

## 2016-02-13 DIAGNOSIS — F17218 Nicotine dependence, cigarettes, with other nicotine-induced disorders: Secondary | ICD-10-CM | POA: Insufficient documentation

## 2016-02-13 DIAGNOSIS — F329 Major depressive disorder, single episode, unspecified: Secondary | ICD-10-CM | POA: Insufficient documentation

## 2016-02-13 DIAGNOSIS — I509 Heart failure, unspecified: Secondary | ICD-10-CM | POA: Diagnosis not present

## 2016-02-13 DIAGNOSIS — L97222 Non-pressure chronic ulcer of left calf with fat layer exposed: Secondary | ICD-10-CM | POA: Diagnosis not present

## 2016-02-13 DIAGNOSIS — I110 Hypertensive heart disease with heart failure: Secondary | ICD-10-CM | POA: Diagnosis not present

## 2016-02-13 DIAGNOSIS — Z6841 Body Mass Index (BMI) 40.0 and over, adult: Secondary | ICD-10-CM | POA: Diagnosis not present

## 2016-02-13 DIAGNOSIS — G40909 Epilepsy, unspecified, not intractable, without status epilepticus: Secondary | ICD-10-CM | POA: Diagnosis not present

## 2016-02-13 DIAGNOSIS — Z885 Allergy status to narcotic agent status: Secondary | ICD-10-CM | POA: Diagnosis not present

## 2016-02-13 DIAGNOSIS — J449 Chronic obstructive pulmonary disease, unspecified: Secondary | ICD-10-CM | POA: Diagnosis not present

## 2016-02-13 DIAGNOSIS — I890 Lymphedema, not elsewhere classified: Secondary | ICD-10-CM | POA: Insufficient documentation

## 2016-02-13 DIAGNOSIS — G4730 Sleep apnea, unspecified: Secondary | ICD-10-CM | POA: Insufficient documentation

## 2016-02-14 NOTE — Progress Notes (Signed)
ZYMIER, RODGERS ([id number]) Visit Report for 02/13/2016 Arrival Information Details Patient Name: MONG, NEAL. Date of Service: 02/13/2016 1:30 PM Medical Record Number: [id number] Patient Account Number: [id number] Date of Birth/Sex: 01-14-1965 (51 y.o. Male) Treating RN: Carolyne Fiscal, Debi Primary Care : PATIENT, NO Other Clinician: Referring : Loletha Grayer Treating /Extender: Frann Rider in Treatment: 4 Visit Information History Since Last Visit All ordered tests and consults were completed: No Patient Arrived: Ambulatory Added or deleted any medications: No Arrival Time: 13:23 Any new allergies or adverse reactions: No Accompanied By: sister Had a fall or experienced change in No Transfer Assistance: None activities of daily living that may affect Patient Identification Verified: Yes risk of falls: Secondary Verification Process Yes Signs or symptoms of abuse/neglect since last No Completed: visito Patient Requires Transmission-Based No Hospitalized since last visit: No Precautions: Has Dressing in Place as Prescribed: Yes Patient Has Alerts: Yes Has Compression in Place as Prescribed: Yes Patient Alerts: ASA Pain Present Now: Yes Electronic Signature(s) Signed: 02/13/2016 2:38:46 PM By: Alric Quan Entered By: Alric Quan on 02/13/2016 13:24:14 Stander, Azeem P. ([id number]) -------------------------------------------------------------------------------- Encounter Discharge Information Details Patient Name: Capers, Delbert P. Date of Service: 02/13/2016 1:30 PM Medical Record Number: [id number] Patient Account Number: [id number] Date of Birth/Sex: 07-08-1965 (51 y.o. Male) Treating RN: Carolyne Fiscal, Debi Primary Care : PATIENT, NO Other Clinician: Referring : Loletha Grayer Treating /Extender: Frann Rider in Treatment: 4 Encounter Discharge Information Items Discharge Pain Level:  0 Discharge Condition: Stable Ambulatory Status: Ambulatory Discharge Destination: Home Transportation: Private Auto Accompanied By: self Schedule Follow-up Appointment: Yes Medication Reconciliation completed and provided to Patient/Care No : Provided on Clinical Summary of Care: 02/13/2016 Form Type Recipient Paper Patient RB Electronic Signature(s) Signed: 02/13/2016 1:52:50 PM By: Ruthine Dose Entered By: Ruthine Dose on 02/13/2016 13:52:50 Hase, Tadeusz P. ([id number]) -------------------------------------------------------------------------------- Lower Extremity Assessment Details Patient Name: Cherney, Colie P. Date of Service: 02/13/2016 1:30 PM Medical Record Number: [id number] Patient Account Number: [id number] Date of Birth/Sex: 02/17/1965 (51 y.o. Male) Treating RN: Carolyne Fiscal, Debi Primary Care : PATIENT, NO Other Clinician: Referring : Loletha Grayer Treating /Extender: Frann Rider in Treatment: 4 Edema Assessment Assessed: [Left: No] [Right: No] E[Left: dema] [Right: :] Calf Left: Right: Point of Measurement: 34 cm From Medial Instep 52 cm cm Ankle Left: Right: Point of Measurement: 11 cm From Medial Instep 28.4 cm cm Vascular Assessment Pulses: Dorsalis Pedis Palpable: [Left:Yes] Posterior Tibial Extremity colors, hair growth, and conditions: Extremity Color: [Left:Red] Temperature of Extremity: [Left:Warm] Capillary Refill: [Left:< 3 seconds] Electronic Signature(s) Signed: 02/13/2016 2:38:46 PM By: Alric Quan Entered By: Alric Quan on 02/13/2016 13:33:52 Hoecker, Parker P. ([id number]) -------------------------------------------------------------------------------- Multi Wound Chart Details Patient Name: Baldyga, Nickolus P. Date of Service: 02/13/2016 1:30 PM Medical Record Number: [id number] Patient Account Number: [id number] Date of Birth/Sex: 08-Aug-1965 (51 y.o. Male) Treating RN: Carolyne Fiscal,  Debi Primary Care : PATIENT, NO Other Clinician: Referring : Loletha Grayer Treating /Extender: Frann Rider in Treatment: 4 Vital Signs Height(in): 69 Pulse(bpm): 95 Weight(lbs): 316 Blood Pressure 184/91 (mmHg): Body Mass Index(BMI): 47 Temperature(F): 97.7 Respiratory Rate 18 (breaths/min): Photos: [1:No Photos] [2:No Photos] [N/A:N/A] Wound Location: [1:Left Lower Leg - Anterior] [2:Left Lower Leg - Posterior] [N/A:N/A] Wounding Event: [1:Gradually Appeared] [2:Gradually Appeared] [N/A:N/A] Primary Etiology: [1:Venous Leg Ulcer] [2:Venous Leg Ulcer] [N/A:N/A] Comorbid History: [1:Chronic Obstructive Pulmonary Disease (COPD), Sleep Apnea, Congestive Heart Failure, Coronary Artery Disease, Seizure Disorder] [2:Chronic Obstructive Pulmonary Disease (COPD), Sleep Apnea, Congestive Heart Failure, Coronary Artery  Disease, Seizure Disorder] [N/A:N/A]  Date Acquired: [1:12/26/2015] [2:12/26/2015] [N/A:N/A] Weeks of Treatment: [1:4] [2:4] [N/A:N/A] Wound Status: [1:Open] [2:Healed - Epithelialized] [N/A:N/A] Measurements L x W x D 3.5x2x0.1 [2:0x0x0] [N/A:N/A] (cm) Area (cm) : [1:5.498] [2:0] [N/A:N/A] Volume (cm) : [1:0.55] [2:0] [N/A:N/A] % Reduction in Area: [1:78.80%] [2:100.00%] [N/A:N/A] % Reduction in Volume: 78.80% [2:100.00%] [N/A:N/A] Classification: [1:Partial Thickness] [2:Partial Thickness] [N/A:N/A] Exudate Amount: [1:Large] [2:None Present] [N/A:N/A] Exudate Type: [1:Serous] [2:N/A] [N/A:N/A] Exudate Color: [1:amber] [2:N/A] [N/A:N/A] Wound Margin: [1:Flat and Intact] [2:Distinct, outline attached] [N/A:N/A] Granulation Amount: [1:None Present (0%)] [2:None Present (0%)] [N/A:N/A] Necrotic Amount: [1:Large (67-100%)] [2:None Present (0%)] [N/A:N/A] Necrotic Tissue: [1:Eschar, Adherent Slough] [2:N/A] [N/A:N/A] Exposed Structures: [1:Fascia: No Fat Layer (Subcutaneous Tissue) Exposed: No] [2:N/A] [N/A:N/A] Tendon: No Muscle:  No Joint: No Bone: No Limited to Skin Breakdown Epithelialization: Small (1-33%) Large (67-100%) N/A Periwound Skin Texture: Excoriation: No No Abnormalities Noted N/A Induration: No Callus: No Crepitus: No Rash: No Scarring: No Periwound Skin Maceration: No No Abnormalities Noted N/A Moisture: Dry/Scaly: No Periwound Skin Color: Atrophie Blanche: No No Abnormalities Noted N/A Cyanosis: No Ecchymosis: No Erythema: No Hemosiderin Staining: No Mottled: No Pallor: No Rubor: No Temperature: No Abnormality No Abnormality N/A Tenderness on Yes Yes N/A Palpation: Wound Preparation: Ulcer Cleansing: Other: Ulcer Cleansing: N/A soap and water Rinsed/Irrigated with Saline Topical Anesthetic Applied: Other: lidocaine Topical Anesthetic 4% Applied: None Treatment Notes Wound #1 (Left, Anterior Lower Leg) 1. Cleansed with: Clean wound with Normal Saline 2. Anesthetic Topical Lidocaine 4% cream to wound bed prior to debridement 4. Dressing Applied: Aquacel Ag 5. Secondary Dressing Applied ABD Pad 7. Secured with Tape 3 Layer Compression System - Left Lower Extremity KRYSTOPHER, KUENZEL ([id number]) Electronic Signature(s) Signed: 02/13/2016 1:45:32 PM By: Christin Fudge MD, FACS Previous Signature: 02/13/2016 1:36:36 PM Version By: Christin Fudge MD, FACS Entered By: Christin Fudge on 02/13/2016 13:45:32 Inclan, Governor Mamie Nick ([id number]) -------------------------------------------------------------------------------- [place] Details Patient Name: Fujiwara, Azlaan P. Date of Service: 02/13/2016 1:30 PM Medical Record Number: [id number] Patient Account Number: [id number] Date of Birth/Sex: 07-30-1965 (51 y.o. Male) Treating RN: Carolyne Fiscal, Debi Primary Care Jeree Delcid: PATIENT, NO Other Clinician: Referring Tyrone Pautsch: Loletha Grayer Treating Jaire Pinkham/Extender: Frann Rider in Treatment: 4 Active Inactive ` Abuse / Safety / Falls / Self Care  Management Nursing Diagnoses: Potential for falls Goals: Patient will remain injury free Date Initiated: 01/16/2016 Target Resolution Date: 03/11/2016 Goal Status: Active Interventions: Assess fall risk on admission and as needed Assess self care needs on admission and as needed Notes: ` Nutrition Nursing Diagnoses: Imbalanced nutrition Potential for alteratiion in Nutrition/Potential for imbalanced nutrition Goals: Patient/caregiver agrees to and verbalizes understanding of need to use nutritional supplements and/or vitamins as prescribed Date Initiated: 01/16/2016 Target Resolution Date: 03/11/2016 Goal Status: Active Interventions: Assess patient nutrition upon admission and as needed per policy Notes: ` Orientation to the [place], Jasaun P. ([id number]) Nursing Diagnoses: Knowledge deficit related to the wound healing center program Goals: Patient/caregiver will verbalize understanding of the [place] Date Initiated: 01/16/2016 Target Resolution Date: 03/11/2016 Goal Status: Active Interventions: Provide education on orientation to the wound center Notes: ` Pain, Acute or Chronic Nursing Diagnoses: Pain, acute or chronic: actual or potential Potential alteration in comfort, pain Goals: Patient will verbalize adequate pain control and receive pain control interventions during procedures as needed Date Initiated: 01/16/2016 Target Resolution Date: 03/11/2016 Goal Status: Active Patient/caregiver will verbalize adequate pain control between visits Date Initiated: 01/16/2016 Target Resolution Date: 03/11/2016 Goal Status: Active Patient/caregiver will verbalize comfort level  met Date Initiated: 01/16/2016 Target Resolution Date: 03/11/2016 Goal Status: Active Interventions: Assess comfort goal upon admission Complete pain assessment as per visit requirements Notes: ` Venous Leg Ulcer Nursing Diagnoses: Actual venous Insuffiency (use  after diagnosis is confirmed) Knowledge deficit related to disease process and management Potential for venous Insuffiency (use before diagnosis confirmed) LAYMOND, POSTLE ([id number]) Goals: Patient will maintain optimal edema control Date Initiated: 01/16/2016 Target Resolution Date: 03/11/2016 Goal Status: Active Patient/caregiver will verbalize understanding of disease process and disease management Date Initiated: 01/16/2016 Target Resolution Date: 03/11/2016 Goal Status: Active Interventions: Assess peripheral edema status every visit. Provide education on venous insufficiency Notes: ` Wound/Skin Impairment Nursing Diagnoses: Impaired tissue integrity Knowledge deficit related to smoking impact on wound healing Knowledge deficit related to ulceration/compromised skin integrity Goals: Ulcer/skin breakdown will have a volume reduction of 30% by week 4 Date Initiated: 01/16/2016 Target Resolution Date: 03/11/2016 Goal Status: Active Ulcer/skin breakdown will have a volume reduction of 50% by week 8 Date Initiated: 01/16/2016 Target Resolution Date: 03/11/2016 Goal Status: Active Ulcer/skin breakdown will have a volume reduction of 80% by week 12 Date Initiated: 01/16/2016 Target Resolution Date: 03/11/2016 Goal Status: Active Interventions: Assess patient/caregiver ability to perform ulcer/skin care regimen upon admission and as needed Assess ulceration(s) every visit Provide education on smoking Provide education on ulcer and skin care Notes: Electronic Signature(s) Signed: 02/13/2016 2:38:46 PM By: Alric Quan Entered By: Alric Quan on 02/13/2016 13:36:55 Bartek, Beauford P. ([id number]) Drohan, Trevon P. ([id number]) -------------------------------------------------------------------------------- Pain Assessment Details Patient Name: Mutschler, Rodgers P. Date of Service: 02/13/2016 1:30 PM Medical Record Number: [id number] Patient Account Number: [id number] Date of  Birth/Sex: 1965/08/06 (51 y.o. Male) Treating RN: Carolyne Fiscal, Debi Primary Care : PATIENT, NO Other Clinician: Referring : Loletha Grayer Treating /Extender: Frann Rider in Treatment: 4 Active Problems Location of Pain Severity and Description of Pain Patient Has Paino Yes Site Locations Pain Location: Pain in Ulcers With Dressing Change: Yes Rate the pain. Current Pain Level: 4 Character of Pain Describe the Pain: Burning Pain Management and Medication Current Pain Management: Electronic Signature(s) Signed: 02/13/2016 2:38:46 PM By: Alric Quan Entered By: Alric Quan on 02/13/2016 13:24:32 Troy, Kiaan Mamie Nick ([id number]) -------------------------------------------------------------------------------- Patient/Caregiver Education Details Patient Name: Amescua, Jahkai P. Date of Service: 02/13/2016 1:30 PM Medical Record Number: [id number] Patient Account Number: [id number] Date of Birth/Gender: 07-11-1965 (51 y.o. Male) Treating RN: Carolyne Fiscal, Debi Primary Care Physician: PATIENT, NO Other Clinician: Referring Physician: Loletha Grayer Treating Physician/Extender: Frann Rider in Treatment: 4 Education Assessment Education Provided To: Patient Education Topics Provided Wound/Skin Impairment: Handouts: Other: change dressing as ordered and do not get dressings wet Methods: Demonstration, Explain/Verbal Responses: State content correctly Electronic Signature(s) Signed: 02/13/2016 2:38:46 PM By: Alric Quan Entered By: Alric Quan on 02/13/2016 13:37:37 Bachus, Zaylan P. ([id number]) -------------------------------------------------------------------------------- Wound Assessment Details Patient Name: Trang, Rochelle P. Date of Service: 02/13/2016 1:30 PM Medical Record Number: [id number] Patient Account Number: [id number] Date of Birth/Sex: 1965-03-29 (51 y.o. Male) Treating RN: Carolyne Fiscal, Debi Primary Care  : PATIENT, NO Other Clinician: Referring : Loletha Grayer Treating /Extender: Frann Rider in Treatment: 4 Wound Status Wound Number: 1 Primary Venous Leg Ulcer Etiology: Wound Location: Left Lower Leg - Anterior Wound Open Wounding Event: Gradually Appeared Status: Date Acquired: 12/26/2015 Comorbid Chronic Obstructive Pulmonary Disease Weeks Of Treatment: 4 History: (COPD), Sleep Apnea, Congestive Clustered Wound: No Heart Failure, Coronary Artery Disease, Seizure Disorder Photos Photo Uploaded By: Alric Quan on 02/13/2016 14:00:49 Wound Measurements Length: (cm) 3.5 Width: (cm) 2  Depth: (cm) 0.1 Area: (cm) 5.498 Volume: (cm) 0.55 % Reduction in Area: 78.8% % Reduction in Volume: 78.8% Epithelialization: Small (1-33%) Tunneling: No Undermining: No Wound Description Classification: Partial Thickness Foul Odor After Wound Margin: Flat and Intact Slough/Fibrino Exudate Amount: Large Exudate Type: Serous Exudate Color: amber Cleansing: No Yes Wound Bed Granulation Amount: None Present (0%) Exposed Structure Necrotic Amount: Large (67-100%) Fascia Exposed: No Necrotic Quality: Eschar, Adherent Slough Fat Layer (Subcutaneous Tissue) Exposed: No Kinn, Yuvin P. ([id number]) Tendon Exposed: No Muscle Exposed: No Joint Exposed: No Bone Exposed: No Limited to Skin Breakdown Periwound Skin Texture Texture Color No Abnormalities Noted: No No Abnormalities Noted: No Callus: No Atrophie Blanche: No Crepitus: No Cyanosis: No Excoriation: No Ecchymosis: No Induration: No Erythema: No Rash: No Hemosiderin Staining: No Scarring: No Mottled: No Pallor: No Moisture Rubor: No No Abnormalities Noted: No Dry / Scaly: No Temperature / Pain Maceration: No Temperature: No Abnormality Tenderness on Palpation: Yes Wound Preparation Ulcer Cleansing: Other: soap and water, Topical Anesthetic Applied: Other: lidocaine  4%, Treatment Notes Wound #1 (Left, Anterior Lower Leg) 1. Cleansed with: Clean wound with Normal Saline 2. Anesthetic Topical Lidocaine 4% cream to wound bed prior to debridement 4. Dressing Applied: Aquacel Ag 5. Secondary Dressing Applied ABD Pad 7. Secured with Tape 3 Layer Compression System - Left Lower Extremity Electronic Signature(s) Signed: 02/13/2016 2:38:46 PM By: Alric Quan Entered By: Alric Quan on 02/13/2016 13:32:14 Vavrek, Harriet P. ([id number]) -------------------------------------------------------------------------------- Wound Assessment Details Patient Name: Teixeira, Rashun P. Date of Service: 02/13/2016 1:30 PM Medical Record Number: [id number] Patient Account Number: [id number] Date of Birth/Sex: 09-30-1965 (51 y.o. Male) Treating RN: Carolyne Fiscal, Debi Primary Care : PATIENT, NO Other Clinician: Referring : Loletha Grayer Treating /Extender: Frann Rider in Treatment: 4 Wound Status Wound Number: 2 Primary Venous Leg Ulcer Etiology: Wound Location: Left Lower Leg - Posterior Wound Healed - Epithelialized Wounding Event: Gradually Appeared Status: Date Acquired: 12/26/2015 Comorbid Chronic Obstructive Pulmonary Disease Weeks Of Treatment: 4 History: (COPD), Sleep Apnea, Congestive Clustered Wound: No Heart Failure, Coronary Artery Disease, Seizure Disorder Photos Photo Uploaded By: Alric Quan on 02/13/2016 14:00:49 Wound Measurements Length: (cm) 0 % Reductio Width: (cm) 0 % Reductio Depth: (cm) 0 Epithelial Area: (cm) 0 Tunneling Volume: (cm) 0 Undermini n in Area: 100% n in Volume: 100% ization: Large (67-100%) : No ng: No Wound Description Classification: Partial Thickness Wound Margin: Distinct, outline attached Exudate Amount: None Present Foul Odor After Cleansing: No Slough/Fibrino No Wound Bed Granulation Amount: None Present (0%) Necrotic Amount: None Present (0%) Periwound  Skin Texture Texture Color Melhorn, Kaid P. ([id number]) No Abnormalities Noted: No No Abnormalities Noted: No Moisture Temperature / Pain No Abnormalities Noted: No Temperature: No Abnormality Tenderness on Palpation: Yes Wound Preparation Ulcer Cleansing: Rinsed/Irrigated with Saline Topical Anesthetic Applied: None Electronic Signature(s) Signed: 02/13/2016 2:38:46 PM By: Alric Quan Entered By: Alric Quan on 02/13/2016 13:32:44 Piltz, Jaki P. ([id number]) -------------------------------------------------------------------------------- Vitals Details Patient Name: Kuhrt, Nagee P. Date of Service: 02/13/2016 1:30 PM Medical Record Number: [id number] Patient Account Number: [id number] Date of Birth/Sex: [birth year] (51 y.o. Male) Treating RN: Carolyne Fiscal, Debi Primary Care : PATIENT, NO Other Clinician: Referring : Loletha Grayer Treating /Extender: Frann Rider in Treatment: 4 Vital Signs Time Taken: 13:24 Temperature (F): 97.7 Height (in): 69 Pulse (bpm): 95 Weight (lbs): 316 Respiratory Rate (breaths/min): 18 Body Mass Index (BMI): 46.7 Blood Pressure (mmHg): 184/91 Reference Range: 80 - 120 mg / dl Electronic Signature(s) Signed: 02/13/2016 2:38:46 PM By: Alric Quan Entered  By: Alric Quan on 02/13/2016 13:24:52

## 2016-02-14 NOTE — Progress Notes (Addendum)
NEKO, KOMISAR ([id number]) Visit Report for 02/13/2016 Chief Complaint Document Details Patient Name: [name], [name]. Date of Service: 02/13/2016 1:30 PM Medical Record Number: [id number] Patient Account Number: [id number] Date of Birth/Sex: 1965/05/15 (51 y.o. Male) Treating RN: Carolyne Fiscal, Debi Primary Care Provider: PATIENT, NO Other Clinician: Referring Provider: Loletha Grayer Treating Provider/Extender: Frann Rider in Treatment: 4 Information Obtained from: Patient Chief Complaint Patient presents to the wound care center for a consult due non healing wound for bilateral lower extremity edema which she's had for several months Electronic Signature(s) Signed: 02/13/2016 1:45:39 PM By: Christin Fudge MD, FACS Previous Signature: 02/13/2016 1:36:42 PM Version By: Christin Fudge MD, FACS Entered By: Christin Fudge on 02/13/2016 13:45:39 [name], [name] ([id number]) -------------------------------------------------------------------------------- HPI Details Patient Name: [name], [name] Date of Service: 02/13/2016 1:30 PM Medical Record Number: [id number] Patient Account Number: [id number] Date of Birth/Sex: 08-20-1965 (51 y.o. Male) Treating RN: Carolyne Fiscal, Debi Primary Care Provider: PATIENT, NO Other Clinician: Referring Provider: Loletha Grayer Treating Provider/Extender: Frann Rider in Treatment: 4 History of Present Illness Location: a lateral lower extremity edema with swelling and infection of the left lower extremity Quality: Patient reports experiencing a sharp pain to affected area(s). Severity: Patient states wound are getting worse. Duration: Patient has had the wound for > 3 months prior to seeking treatment at the wound center Timing: Pain in wound is constant (hurts all the time) Context: The wound appeared gradually over time Modifying Factors: Consults to this date include:was admitted to the hospital for IV antibiotics  and treatment Associated Signs and Symptoms: Patient reports having increase discharge. HPI Description: 51 year old gentleman who is a current every day smoker and smokes a pack of cigarettes a day for the last 30 years also is known to have had a recent admission to the hospital between December 25 and 01/09/2016 and was admitted for swelling and cellulitis of the left lower extremity. he has a past medical history of COPD, coronary artery disease, hypertension, peptic ulcer and sleep apnea. His admission he was known to have cellulitis the left lower extremity and bilateral lower extremity lymphedema, and was placed on aggressive antibiotics( vancomycin and Zosyn) and Unna boot and change to Ancef later. He was also treated for COPD, essential hypertension and nicotine abuse. An echo done and showed a normal ejection fraction and was started on low-dose Lasix. he was discharged home on Keflex. during the admission of a duplex study was done for possible DVT and there was no evidence of this found in the left lower extremity. 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 In the process of finding a PCP here. 01/23/2016 -- his vascular workup is later this month and his PCP appointment is not too early February. 02/07/2016 -- venous duplex study was done yesterday and has his arterial workup to be done this coming Tuesday. 02/13/2016 -- had a lower extremity venous reflux examination done and reviewed by Dr. Lucky Cowboy. There was no DVT or SVT bilaterally. There was no incompetence of bilateral greater saphenous and small saphenous veins. Moderate interstitial edema of the left calf noted. ABI was done and the right ABI was 1.05 left was 1.11 and it was a triphasic flow. The right TBI was 0.68 and left was 0.63. Atrial duplex was not done due to normal ABIs Was seen by Dr. Leotis Pain on 02/11/2016. After reviewing him he did not see any  vascular causes which are causing  him the nonhealing wounds and requested he continue with wound care. His wounds have healed he would consider him for a lymphedema pump Electronic Signature(s) Signed: 02/13/2016 1:45:45 PM By: Christin Fudge MD, FACS Previous Signature: 02/13/2016 1:37:26 PM Version By: Christin Fudge MD, FACS [name], [name] ([id number]) Entered By: Christin Fudge on 02/13/2016 13:45:45 [name], [name] ([id number]) -------------------------------------------------------------------------------- Physical Exam Details Patient Name: Brede, Graysyn P. Date of Service: 02/13/2016 1:30 PM Medical Record Number: [id number] Patient Account Number: [id number] Date of Birth/Sex: May 16, 1965 (51 y.o. Male) Treating RN: Ahmed Prima Primary Care Provider: PATIENT, NO Other Clinician: Referring Provider: Loletha Grayer Treating Provider/Extender: Frann Rider in Treatment: 4 Constitutional . Pulse regular. Respirations normal and unlabored. Afebrile. . Eyes Nonicteric. Reactive to light. Ears, Nose, Mouth, and Throat Lips, teeth, and gums WNL.Marland Kitchen Moist mucosa without lesions. Neck supple and nontender. No palpable supraclavicular or cervical adenopathy. Normal sized without goiter. Respiratory WNL. No retractions.. Breath sounds WNL, No rubs, rales, rhonchi, or wheeze.. Cardiovascular Heart rhythm and rate regular, no murmur or gallop.. Pedal Pulses WNL. No clubbing, cyanosis or edema. Chest Breasts symmetical and no nipple discharge.. Breast tissue WNL, no masses, lumps, or tenderness.. Lymphatic No adneopathy. No adenopathy. No adenopathy. Musculoskeletal Adexa without tenderness or enlargement.. Digits and nails w/o clubbing, cyanosis, infection, petechiae, ischemia, or inflammatory conditions.. Integumentary (Hair, Skin) No suspicious lesions. No crepitus or fluctuance. No peri-wound warmth or erythema. No masses.Marland Kitchen Psychiatric Judgement and insight Intact.. No  evidence of depression, anxiety, or agitation.. Notes the ulceration on the anterior lateral part of his leg still persist but other than that the lymphedema is looking better. He had some eschar which was washed out with moist saline gauze and no sharp debridement was required today Electronic Signature(s) Signed: 02/13/2016 1:46:33 PM By: Christin Fudge MD, FACS Entered By: Christin Fudge on 02/13/2016 13:46:33 [name], [name] ([id number]) -------------------------------------------------------------------------------- Physician Orders Details Patient Name: Lauder, Bensyn P. Date of Service: 02/13/2016 1:30 PM Medical Record Number: [id number] Patient Account Number: [id number] Date of Birth/Sex: [birth year] (51 y.o. Male) Treating RN: Carolyne Fiscal, Debi Primary Care Provider: PATIENT, NO Other Clinician: Referring Provider: Loletha Grayer Treating Provider/Extender: Frann Rider in Treatment: 4 Verbal / Phone Orders: Yes Clinician: Carolyne Fiscal, Debi Read Back and Verified: Yes Diagnosis Coding ICD-10 Coding Code Description I89.0 Lymphedema, not elsewhere classified L97.222 Non-pressure chronic ulcer of left calf with fat layer exposed E66.01 Morbid (severe) obesity due to excess calories F17.218 Nicotine dependence, cigarettes, with other nicotine-induced disorders L03.116 Cellulitis of left lower limb Wound Cleansing Wound #1 Left,Anterior Lower Leg o Clean wound with Normal Saline. o Cleanse wound with mild soap and water - HHRN to clean with soap and water when changing wraps. Primary Wound Dressing Wound #1 Left,Anterior Lower Leg o Aquacel Ag - or Equal Secondary Dressing Wound #1 Left,Anterior Lower Leg o ABD pad Dressing Change Frequency Wound #1 Left,Anterior Lower Leg o Three times weekly - HHRN to change Mondays and Wednesdays. Pt will come to wound care clinic on Fridays to have it changed there. Follow-up Appointments Wound #1 Left,Anterior  Lower Leg o Return Appointment in 1 week. Edema Control Wound #1 Left,Anterior Lower Leg o 3 Layer Compression System - Left Lower Extremity - Wrap from base of toes to 3cm below knee. ANTERRIO, RANDELL ([id number]) o Elevate legs to the level of the heart and pump ankles as often as possible Additional Orders / Instructions Wound #1 Left,Anterior Lower Leg o Stop Smoking o Increase protein intake. Home Health Wound #1 Left,Anterior  Lower Leg o [place] Visits - [facility] [address] to change Mondays and Wednesdays. Pt will come to wound care clinic on Fridays to have it changed there. o Home Health Nurse may visit PRN to address patientos wound care needs. o FACE TO FACE ENCOUNTER: MEDICARE and MEDICAID PATIENTS: I certify that this patient is under my care and that I had a face-to-face encounter that meets the physician face-to-face encounter requirements with this patient on this date. The encounter with the patient was in whole or in part for the following MEDICAL CONDITION: (primary reason for [place]) MEDICAL NECESSITY: I certify, that based on my findings, NURSING services are a medically necessary home health service. HOME BOUND STATUS: I certify that my clinical findings support that this patient is homebound (i.e., Due to illness or injury, pt requires aid of supportive devices such as crutches, cane, wheelchairs, walkers, the use of special transportation or the assistance of another person to leave their place of residence. There is a normal inability to leave the home and doing so requires considerable and taxing effort. Other absences are for medical reasons / religious services and are infrequent or of short duration when for other reasons). o If current dressing causes regression in wound condition, may D/C ordered dressing product/s and apply Normal Saline Moist Dressing daily until next [place] / Other MD appointment. [place] of regression in wound condition at [phone]. o Please direct any NON-WOUND related issues/requests for orders to patient's Primary Care Physician Medications-please add to medication list. Wound #1 Left,Anterior Lower Leg o BellO. Antibiotics - Complete Doxycycline o Other: - Vitamin C, Zinc, Multivitamin Electronic Signature(s) Signed: 02/13/2016 3:11:09 PM By: Christin Fudge MD, FACS Entered By: Christin Fudge on 02/13/2016 13:46:44 [name], [name] ([id number]) -------------------------------------------------------------------------------- Problem List Details Patient Name: Gawthrop, Yaziel P. Date of Service: 02/13/2016 1:30 PM Medical Record Number: [id number] Patient Account Number: [id number] Date of Birth/Sex: 1965/02/17 (51 y.o. Male) Treating RN: Carolyne Fiscal, Debi Primary Care Provider: PATIENT, NO Other Clinician: Referring Provider: Loletha Grayer Treating Provider/Extender: Frann Rider in Treatment: 4 Active Problems ICD-10 Encounter Code Description Active Date Diagnosis I89.0 Lymphedema, not elsewhere classified 01/16/2016 Yes L97.222 Non-pressure chronic ulcer of left calf with fat layer 01/16/2016 Yes exposed E66.01 Morbid (severe) obesity due to excess calories 01/16/2016 Yes F17.218 Nicotine dependence, cigarettes, with other nicotine- 01/16/2016 Yes induced disorders L03.116 Cellulitis of left lower limb 01/23/2016 Yes Inactive Problems Resolved Problems Electronic Signature(s) Signed: 02/13/2016 1:45:27 PM By: Christin Fudge MD, FACS Previous Signature: 02/13/2016 1:36:32 PM Version By: Christin Fudge MD, FACS Entered By: Christin Fudge on 02/13/2016 13:45:27 [name], [name] ([id number]) -------------------------------------------------------------------------------- Progress Note Details Patient Name: [name], [name] Date of Service: 02/13/2016 1:30 PM Medical Record Number: [id number] Patient Account Number: [id number] Date of  Birth/Sex: 1965/06/13 (51 y.o. Male) Treating RN: Carolyne Fiscal, Debi Primary Care Provider: PATIENT, NO Other Clinician: Referring Provider: Loletha Grayer Treating Provider/Extender: Frann Rider in Treatment: 4 Subjective Chief Complaint Information obtained from Patient Patient presents to the wound care center for a consult due non healing wound for bilateral lower extremity edema which she's had for several months History of Present Illness (HPI) The following HPI elements were documented for the patient's wound: Location: a lateral lower extremity edema with swelling and infection of the left lower extremity Quality: Patient reports experiencing a sharp pain to affected area(s). Severity: Patient states wound are getting worse. Duration: Patient has had the wound for > 3 months prior to seeking  treatment at the wound center Timing: Pain in wound is constant (hurts all the time) Context: The wound appeared gradually over time Modifying Factors: Consults to this date include:was admitted to the hospital for IV antibiotics and treatment Associated Signs and Symptoms: Patient reports having increase discharge. 51 year old gentleman who is a current every day smoker and smokes a pack of cigarettes a day for the last 30 years also is known to have had a recent admission to the hospital between December 25 and 01/09/2016 and was admitted for swelling and cellulitis of the left lower extremity. he has a past medical history of COPD, coronary artery disease, hypertension, peptic ulcer and sleep apnea. His admission he was known to have cellulitis the left lower extremity and bilateral lower extremity lymphedema, and was placed on aggressive antibiotics( vancomycin and Zosyn) and Unna boot and change to Ancef later. He was also treated for COPD, essential hypertension and nicotine abuse. An echo done and showed a normal ejection fraction and was started on low-dose Lasix. he was  discharged home on Keflex. during the admission of a duplex study was done for possible DVT and there was no evidence of this found in the left lower extremity. 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 In the process of finding a PCP here. 01/23/2016 -- his vascular workup is later this month and his PCP appointment is not too early February. 02/07/2016 -- venous duplex study was done yesterday and has his arterial workup to be done this coming Tuesday. 02/13/2016 -- had a lower extremity venous reflux examination done and reviewed by Dr. Lucky Cowboy. There was no DVT or SVT bilaterally. There was no incompetence of bilateral greater saphenous and small saphenous veins. Moderate interstitial edema of the left calf noted. ABI was done and the right ABI was 1.05 left was 1.11 and it was a triphasic flow. The right TBI was 0.68 Gadea, Fausto P. ([id number]) and left was 0.63. Atrial duplex was not done due to normal ABIs Was seen by Dr. Leotis Pain on 02/11/2016. After reviewing him he did not see any vascular causes which are causing him the nonhealing wounds and requested he continue with wound care. His wounds have healed he would consider him for a lymphedema pump Objective Constitutional Pulse regular. Respirations normal and unlabored. Afebrile. Vitals Time Taken: 1:24 PM, Height: 69 in, Weight: 316 lbs, BMI: 46.7, Temperature: 97.7 F, Pulse: 95 bpm, Respiratory Rate: 18 breaths/min, Blood Pressure: 184/91 mmHg. Eyes Nonicteric. Reactive to light. Ears, Nose, Mouth, and Throat Lips, teeth, and gums WNL.Marland Kitchen Moist mucosa without lesions. Neck supple and nontender. No palpable supraclavicular or cervical adenopathy. Normal sized without goiter. Respiratory WNL. No retractions.. Breath sounds WNL, No rubs, rales, rhonchi, or wheeze.. Cardiovascular Heart rhythm and rate regular, no murmur or gallop.. Pedal Pulses WNL. No clubbing,  cyanosis or edema. Chest Breasts symmetical and no nipple discharge.. Breast tissue WNL, no masses, lumps, or tenderness.. Lymphatic No adneopathy. No adenopathy. No adenopathy. Musculoskeletal Adexa without tenderness or enlargement.. Digits and nails w/o clubbing, cyanosis, infection, petechiae, ischemia, or inflammatory conditions.Marland Kitchen Psychiatric Judgement and insight Intact.. No evidence of depression, anxiety, or agitation.. General Notes: the ulceration on the anterior lateral part of his leg still persist but other than that the lymphedema is looking better. He had some eschar which was washed out with moist saline gauze and no [name], [name] ([id number]) sharp debridement was required today Integumentary (Hair, Skin)  No suspicious lesions. No crepitus or fluctuance. No peri-wound warmth or erythema. No masses.. Wound #1 status is Open. Original cause of wound was Gradually Appeared. The wound is located on the Left,Anterior Lower Leg. The wound measures 3.5cm length x 2cm width x 0.1cm depth; 5.498cm^2 area and 0.55cm^3 volume. The wound is limited to skin breakdown. There is no tunneling or undermining noted. There is a large amount of serous drainage noted. The wound margin is flat and intact. There is no granulation within the wound bed. There is a large (67-100%) amount of necrotic tissue within the wound bed including Eschar and Adherent Slough. The periwound skin appearance did not exhibit: Callus, Crepitus, Excoriation, Induration, Rash, Scarring, Dry/Scaly, Maceration, Atrophie Blanche, Cyanosis, Ecchymosis, Hemosiderin Staining, Mottled, Pallor, Rubor, Erythema. Periwound temperature was noted as No Abnormality. The periwound has tenderness on palpation. Wound #2 status is Healed - Epithelialized. Original cause of wound was Gradually Appeared. The wound is located on the Left,Posterior Lower Leg. The wound measures 0cm length x 0cm width x 0cm depth; 0cm^2 area and 0cm^3  volume. There is no tunneling or undermining noted. There is a none present amount of drainage noted. The wound margin is distinct with the outline attached to the wound base. There is no granulation within the wound bed. There is no necrotic tissue within the wound bed. Periwound temperature was noted as No Abnormality. The periwound has tenderness on palpation. Assessment Active Problems ICD-10 I89.0 - Lymphedema, not elsewhere classified L97.222 - Non-pressure chronic ulcer of left calf with fat layer exposed E66.01 - Morbid (severe) obesity due to excess calories F17.218 - Nicotine dependence, cigarettes, with other nicotine-induced disorders L03.116 - Cellulitis of left lower limb Plan Wound Cleansing: Wound #1 Left,Anterior Lower Leg: Clean wound with Normal Saline. Cleanse wound with mild soap and water - HHRN to clean with soap and water when changing wraps. Primary Wound Dressing: Wound #1 Left,Anterior Lower Leg: Aquacel Ag - or Equal [name], [name] ([id number]) Secondary Dressing: Wound #1 Left,Anterior Lower Leg: ABD pad Dressing Change Frequency: Wound #1 Left,Anterior Lower Leg: Three times weekly - HHRN to change Mondays and Wednesdays. Pt will come to wound care clinic on Fridays to have it changed there. Follow-up Appointments: Wound #1 Left,Anterior Lower Leg: Return Appointment in 1 week. Edema Control: Wound #1 Left,Anterior Lower Leg: 3 Layer Compression System - Left Lower Extremity - Wrap from base of toes to 3cm below knee. Elevate legs to the level of the heart and pump ankles as often as possible Additional Orders / Instructions: Wound #1 Left,Anterior Lower Leg: Stop Smoking Increase protein intake. Home Health: Wound #1 Left,Anterior Lower Leg: Continue Home Health Visits - [facility] to change Mondays and Wednesdays. Pt will come to wound care clinic on Fridays to have it changed there. Home Health Nurse may visit PRN to address patient s wound  care needs. FACE TO FACE ENCOUNTER: MEDICARE and MEDICAID PATIENTS: I certify that this patient is under my care and that I had a face-to-face encounter that meets the physician face-to-face encounter requirements with this patient on this date. The encounter with the patient was in whole or in part for the following MEDICAL CONDITION: (primary reason for [place]) MEDICAL NECESSITY: I certify, that based on my findings, NURSING services are a medically necessary home health service. HOME BOUND STATUS: I certify that my clinical findings support that this patient is homebound (i.e., Due to illness or injury, pt requires aid of supportive devices such as crutches, cane, wheelchairs,  walkers, the use of special transportation or the assistance of another person to leave their place of residence. There is a normal inability to leave the home and doing so requires considerable and taxing effort. Other absences are for medical reasons / religious services and are infrequent or of short duration when for other reasons). If current dressing causes regression in wound condition, may D/C ordered dressing product/s and apply Normal Saline Moist Dressing daily until next [place] / Other MD appointment. [place] of regression in wound condition at [phone]. Please direct any NON-WOUND related issues/requests for orders to patient's Primary Care Physician Medications-please add to medication list.: Wound #1 Left,Anterior Lower Leg: BellO. Antibiotics - Complete Doxycycline Other: - Vitamin C, Zinc, Multivitamin I have recommended: 1. Silver alginate, DrawTex and a 3 layer Profore wrap to the left lower extremity. GRAEME, HIATT ([id number]) 2. arterial duplex and venous duplex studies for reflux both lower extremities -- reports reviewed and discussed with him. 3. Vascular consultation reviewed 4. If he is going to be following up here he needs to find a PCP to  help control his medical issues -- appointment in first week of February 5. again discussed the need to completely give up smoking have discussed the risks benefits and alternatives and all the possible complications 6. Regular visits to the wound center Electronic Signature(s) Signed: 02/13/2016 1:47:59 PM By: Christin Fudge MD, FACS Entered By: Christin Fudge on 02/13/2016 13:47:59 Deihl, Stelios P. ([id number]) -------------------------------------------------------------------------------- SuperBill Details Patient Name: Jabs, Cort P. Date of Service: 02/13/2016 Medical Record Number: [id number] Patient Account Number: [id number] Date of Birth/Sex: September 11, 1965 (51 y.o. Male) Treating RN: Carolyne Fiscal, Debi Primary Care Provider: PATIENT, NO Other Clinician: Referring Provider: Loletha Grayer Treating Provider/Extender: Frann Rider in Treatment: 4 Diagnosis Coding ICD-10 Codes Code Description I89.0 Lymphedema, not elsewhere classified L97.222 Non-pressure chronic ulcer of left calf with fat layer exposed E66.01 Morbid (severe) obesity due to excess calories F17.218 Nicotine dependence, cigarettes, with other nicotine-induced disorders L03.116 Cellulitis of left lower limb Facility Procedures CPT4: Description Modifier Quantity Code [id number] (Facility Use Only) [id number] - [place] [id number] LWR LT 1 LEG Physician Procedures CPT4 Code Description: [id number] - WC PHYS LEVEL 3 - EST PT ICD-10 Description Diagnosis I89.0 Lymphedema, not elsewhere classified L97.222 Non-pressure chronic ulcer of left calf with fat E66.01 Morbid (severe) obesity due to excess calories F17.218  Nicotine dependence, cigarettes, with other nicot Modifier: layer exposed ine-induced di Quantity: 1 sorders Electronic Signature(s) Signed: 02/13/2016 2:38:46 PM By: Alric Quan Signed: 02/13/2016 3:11:09 PM By: Christin Fudge MD, FACS Previous Signature: 02/13/2016 1:48:16 PM Version By: Christin Fudge MD, FACS Entered By: Alric Quan on 02/13/2016 13:58:14

## 2016-02-20 ENCOUNTER — Encounter: Payer: BLUE CROSS/BLUE SHIELD | Admitting: Surgery

## 2016-02-20 DIAGNOSIS — I890 Lymphedema, not elsewhere classified: Secondary | ICD-10-CM | POA: Diagnosis not present

## 2016-02-21 NOTE — Progress Notes (Signed)
BASIL, GADISON ([id number]) Visit Report for 02/20/2016 Chief Complaint Document Details Patient Name: [name], [name]. Date of Service: 02/20/2016 2:30 PM Medical Record Number: [id number] Patient Account Number: [id number] Date of Birth/Sex: April 18, 1965 (51 y.o. Male) Treating RN: Cornell Barman Primary Care Provider: PATIENT, NO Other Clinician: Referring Provider: Loletha Grayer Treating Provider/Extender: Frann Rider in Treatment: 5 Information Obtained from: Patient Chief Complaint Patient presents to the wound care center for a consult due non healing wound for bilateral lower extremity edema which she's had for several months Electronic Signature(s) Signed: 02/20/2016 3:00:32 PM By: Christin Fudge MD, FACS Entered By: Christin Fudge on 02/20/2016 15:00:32 Essman, Ayan P. ([id number]) -------------------------------------------------------------------------------- HPI Details Patient Name: [name], [name] Date of Service: 02/20/2016 2:30 PM Medical Record Number: [id number] Patient Account Number: [id number] Date of Birth/Sex: 19-Jul-1965 (51 y.o. Male) Treating RN: Cornell Barman Primary Care Provider: PATIENT, NO Other Clinician: Referring Provider: Loletha Grayer Treating Provider/Extender: Frann Rider in Treatment: 5 History of Present Illness Location: a lateral lower extremity edema with swelling and infection of the left lower extremity Quality: Patient reports experiencing a sharp pain to affected area(s). Severity: Patient states wound are getting worse. Duration: Patient has had the wound for > 3 months prior to seeking treatment at the wound center Timing: Pain in wound is constant (hurts all the time) Context: The wound appeared gradually over time Modifying Factors: Consults to this date include:was admitted to the hospital for IV antibiotics and treatment Associated Signs and Symptoms: Patient reports having increase discharge. HPI Description:  51 year old gentleman who is a current every day smoker and smokes a pack of cigarettes a day for the last 30 years also is known to have had a recent admission to the hospital between December 25 and 01/09/2016 and was admitted for swelling and cellulitis of the left lower extremity. he has a past medical history of COPD, coronary artery disease, hypertension, peptic ulcer and sleep apnea. His admission he was known to have cellulitis the left lower extremity and bilateral lower extremity lymphedema, and was placed on aggressive antibiotics( vancomycin and Zosyn) and Unna boot and change to Ancef later. He was also treated for COPD, essential hypertension and nicotine abuse. An echo done and showed a normal ejection fraction and was started on low-dose Lasix. he was discharged home on Keflex. during the admission of a duplex study was done for possible DVT and there was no evidence of this found in the left lower extremity. 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 In the process of finding a PCP here. 01/23/2016 -- his vascular workup is later this month and his PCP appointment is not too early February. 02/07/2016 -- venous duplex study was done yesterday and has his arterial workup to be done this coming Tuesday. 02/13/2016 -- had a lower extremity venous reflux examination done and reviewed by Dr. Lucky Cowboy. There was no DVT or SVT bilaterally. There was no incompetence of bilateral greater saphenous and small saphenous veins. Moderate interstitial edema of the left calf noted. ABI was done and the right ABI was 1.05 left was 1.11 and it was a triphasic flow. The right TBI was 0.68 and left was 0.63. Atrial duplex was not done due to normal ABIs Was seen by Dr. Leotis Pain on 02/11/2016. After reviewing him he did not see any vascular causes which are causing him the nonhealing wounds and requested he continue with wound care. His  wounds  have healed he would consider him for a lymphedema pump Electronic Signature(s) Signed: 02/20/2016 3:00:37 PM By: Christin Fudge MD, FACS [name], [name] ([id number]) Entered By: Christin Fudge on 02/20/2016 15:00:36 [name], [name] ([id number]) -------------------------------------------------------------------------------- Physical Exam Details Patient Name: Mikels, Zymir P. Date of Service: 02/20/2016 2:30 PM Medical Record Number: [id number] Patient Account Number: [id number] Date of Birth/Sex: 05-12-1965 (51 y.o. Male) Treating RN: Cornell Barman Primary Care Provider: PATIENT, NO Other Clinician: Referring Provider: Loletha Grayer Treating Provider/Extender: Frann Rider in Treatment: 5 Constitutional . Pulse regular. Respirations normal and unlabored. Afebrile. . Eyes Nonicteric. Reactive to light. Ears, Nose, Mouth, and Throat Lips, teeth, and gums WNL.Marland Kitchen Moist mucosa without lesions. Neck supple and nontender. No palpable supraclavicular or cervical adenopathy. Normal sized without goiter. Respiratory WNL. No retractions.. Breath sounds WNL, No rubs, rales, rhonchi, or wheeze.. Cardiovascular Heart rhythm and rate regular, no murmur or gallop.. Pedal Pulses WNL. No clubbing, cyanosis or edema. Chest Breasts symmetical and no nipple discharge.. Breast tissue WNL, no masses, lumps, or tenderness.. Lymphatic No adneopathy. No adenopathy. No adenopathy. Musculoskeletal Adexa without tenderness or enlargement.. Digits and nails w/o clubbing, cyanosis, infection, petechiae, ischemia, or inflammatory conditions.. Integumentary (Hair, Skin) No suspicious lesions. No crepitus or fluctuance. No peri-wound warmth or erythema. No masses.Marland Kitchen Psychiatric Judgement and insight Intact.. No evidence of depression, anxiety, or agitation.. Notes overall improvement has been significant and his lymphedema is down. The weeping is also less and the ulceration is very superficial. No  sharp debridement was required today. Electronic Signature(s) Signed: 02/20/2016 3:01:08 PM By: Christin Fudge MD, FACS Entered By: Christin Fudge on 02/20/2016 15:01:08 Odoherty, Wendie Chess ([id number]) -------------------------------------------------------------------------------- Physician Orders Details Patient Name: [name], [name] Date of Service: 02/20/2016 2:30 PM Medical Record Number: [id number] Patient Account Number: [id number] Date of Birth/Sex: 12-Sep-1965 (51 y.o. Male) Treating RN: Cornell Barman Primary Care Provider: PATIENT, NO Other Clinician: Referring Provider: Loletha Grayer Treating Provider/Extender: Frann Rider in Treatment: 5 Verbal / Phone Orders: Yes Clinician: Cornell Barman Read Back and Verified: Yes Diagnosis Coding Wound Cleansing Wound #1 Left,Anterior Lower Leg o Clean wound with Normal Saline. o May Shower, gently pat wound dry prior to applying new dressing. - Remove wrap and shower 1 hour before appointment. Anesthetic Wound #1 Left,Anterior Lower Leg o Topical Lidocaine 4% cream applied to wound bed prior to debridement Skin Barriers/Peri-Wound Care Wound #1 Left,Anterior Lower Leg o Barrier cream Primary Wound Dressing Wound #1 Left,Anterior Lower Leg o Aquacel Ag Secondary Dressing Wound #1 Left,Anterior Lower Leg o ABD pad Dressing Change Frequency Wound #1 Left,Anterior Lower Leg o Change dressing every week Follow-up Appointments Wound #1 Left,Anterior Lower Leg o Return Appointment in 1 week. Edema Control Wound #1 Left,Anterior Lower Leg o 3 Layer Compression System - Left Lower Extremity - Wrap from base of toes to 3cm below knee. o Elevate legs to the level of the heart and pump ankles as often as possible Deriso, Crescencio P. ([id number]) Additional Orders / Instructions Wound #1 Left,Anterior Lower Leg o Stop Smoking o Increase protein intake. Home Health Wound #1 Left,Anterior Lower Leg o D/C  Home Health Services Medications-please add to medication list. Wound #1 Left,Anterior Lower Leg o Other: - Vitamin C, Zinc, Multivitamin Electronic Signature(s) Signed: 02/20/2016 4:21:54 PM By: Christin Fudge MD, FACS Entered By: Christin Fudge on 02/20/2016 15:01:23 Wetmore, Bobbyjoe P. ([id number]) -------------------------------------------------------------------------------- Problem List Details Patient Name: [name], [name] Date of Service: 02/20/2016 2:30 PM Medical Record Number: [id number] Patient Account Number: [id number]  Date of Birth/Sex: 1965-12-19 (51 y.o. 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 ([id number]) -------------------------------------------------------------------------------- Progress Note Details Patient Name: [name], [name] Date of Service: 02/20/2016 2:30 PM Medical Record Number: [id number] Patient Account Number: [id number] Date of Birth/Sex: [birth year] (51 y.o. Male) Treating RN: Cornell Barman Primary Care Provider: PATIENT, NO Other Clinician: Referring Provider: Loletha Grayer Treating Provider/Extender: Frann Rider in Treatment: 5 Subjective Chief Complaint Information obtained from  Patient Patient presents to the wound care center for a consult due non healing wound for bilateral lower extremity edema which she's had for several months History of Present Illness (HPI) The following HPI elements were documented for the patient's wound: Location: a lateral lower extremity edema with swelling and infection of the left lower extremity Quality: Patient reports experiencing a sharp pain to affected area(s). Severity: Patient states wound are getting worse. Duration: Patient has had the wound for > 3 months prior to seeking treatment at the wound center Timing: Pain in wound is constant (hurts all the time) Context: The wound appeared gradually over time Modifying Factors: Consults to this date include:was admitted to the hospital for IV antibiotics and treatment Associated Signs and Symptoms: Patient reports having increase discharge. 51 year old gentleman who is a current every day smoker and smokes a pack of cigarettes a day for the last 30 years also is known to have had a recent admission to the hospital between December 25 and 01/09/2016 and was admitted for swelling and cellulitis of the left lower extremity. he has a past medical history of COPD, coronary artery disease, hypertension, peptic ulcer and sleep apnea. His admission he was known to have cellulitis the left lower extremity and bilateral lower extremity lymphedema, and was placed on aggressive antibiotics( vancomycin and Zosyn) and Unna boot and change to Ancef later. He was also treated for COPD, essential hypertension and nicotine abuse. An echo done and showed a normal ejection fraction and was started on low-dose Lasix. he was discharged home on Keflex. during the admission of a duplex study was done for possible DVT and there was no evidence of this found in the left lower extremity. 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 In the process of  finding a PCP here. 01/23/2016 -- his vascular workup is later this month and his PCP appointment is not too early February. 02/07/2016 -- venous duplex study was done yesterday and has his arterial workup to be done this coming Tuesday. 02/13/2016 -- had a lower extremity venous reflux examination done and reviewed by Dr. Lucky Cowboy. There was no DVT or SVT bilaterally. There was no incompetence of bilateral greater saphenous and small saphenous veins. Moderate interstitial edema of the left calf noted. ABI was done and the right ABI was 1.05 left was 1.11 and it was a triphasic flow. The right TBI was 0.68 [name], [name] (  [id number]) and left was 0.63. Atrial duplex was not done due to normal ABIs Was seen by Dr. Leotis Pain on 02/11/2016. After reviewing him he did not see any vascular causes which are causing him the nonhealing wounds and requested he continue with wound care. His wounds have healed he would consider him for a lymphedema pump Objective Constitutional Pulse regular. Respirations normal and unlabored. Afebrile. Vitals Time Taken: 2:38 PM, Height: 69 in, Weight: 316 lbs, BMI: 46.7, Temperature: 98.1 F, Pulse: 81 bpm, Respiratory Rate: 16 breaths/min, Blood Pressure: 172/86 mmHg. Eyes Nonicteric. Reactive to light. Ears, Nose, Mouth, and Throat Lips, teeth, and gums WNL.Marland Kitchen Moist mucosa without lesions. Neck supple and nontender. No palpable supraclavicular or cervical adenopathy. Normal sized without goiter. Respiratory WNL. No retractions.. Breath sounds WNL, No rubs, rales, rhonchi, or wheeze.. Cardiovascular Heart rhythm and rate regular, no murmur or gallop.. Pedal Pulses WNL. No clubbing, cyanosis or edema. Chest Breasts symmetical and no nipple discharge.. Breast tissue WNL, no masses, lumps, or tenderness.. Lymphatic No adneopathy. No adenopathy. No adenopathy. Musculoskeletal Adexa without tenderness or enlargement.. Digits and nails w/o clubbing, cyanosis,  infection, petechiae, ischemia, or inflammatory conditions.Marland Kitchen Psychiatric Judgement and insight Intact.. No evidence of depression, anxiety, or agitation.. General Notes: overall improvement has been significant and his lymphedema is down. The weeping is also less and the ulceration is very superficial. No sharp debridement was required today. [name], [name] ([id number]) Integumentary (Hair, Skin) No suspicious lesions. No crepitus or fluctuance. No peri-wound warmth or erythema. No masses.. Wound #1 status is Open. Original cause of wound was Gradually Appeared. The wound is located on the Left,Anterior Lower Leg. The wound measures 2.8cm length x 1.8cm width x 0.1cm depth; 3.958cm^2 area and 0.396cm^3 volume. The wound is limited to skin breakdown. There is no tunneling or undermining noted. There is a large amount of serous drainage noted. The wound margin is flat and intact. There is medium (34-66%) granulation within the wound bed. There is a small (1-33%) amount of necrotic tissue within the wound bed including Adherent Slough. The periwound skin appearance exhibited: Excoriation. The periwound skin appearance did not exhibit: Callus, Crepitus, Induration, Rash, Scarring, Dry/Scaly, Maceration, Atrophie Blanche, Cyanosis, Ecchymosis, Hemosiderin Staining, Mottled, Pallor, Rubor, Erythema. Periwound temperature was noted as No Abnormality. The periwound has tenderness on palpation. Assessment Active Problems ICD-10 I89.0 - Lymphedema, not elsewhere classified L97.222 - Non-pressure chronic ulcer of left calf with fat layer exposed E66.01 - Morbid (severe) obesity due to excess calories F17.218 - Nicotine dependence, cigarettes, with other nicotine-induced disorders L03.116 - Cellulitis of left lower limb Plan Wound Cleansing: Wound #1 Left,Anterior Lower Leg: Clean wound with Normal Saline. May Shower, gently pat wound dry prior to applying new dressing. - Remove wrap and shower  1 hour before appointment. Anesthetic: Wound #1 Left,Anterior Lower Leg: Topical Lidocaine 4% cream applied to wound bed prior to debridement Skin Barriers/Peri-Wound Care: Wound #1 Left,Anterior Lower Leg: Barrier cream Primary Wound Dressing: Wound #1 Left,Anterior Lower Leg: Aquacel Ag [name], [name] ([id number]) Secondary Dressing: Wound #1 Left,Anterior Lower Leg: ABD pad Dressing Change Frequency: Wound #1 Left,Anterior Lower Leg: Change dressing every week Follow-up Appointments: Wound #1 Left,Anterior Lower Leg: Return Appointment in 1 week. Edema Control: Wound #1 Left,Anterior Lower Leg: 3 Layer Compression System - Left Lower Extremity - Wrap from base of toes to 3cm below knee. Elevate legs to the level of the heart and pump ankles as often as possible Additional Orders / Instructions: Wound #1 Left,Anterior Lower Leg:  Stop Smoking Increase protein intake. Home Health: Wound #1 Left,Anterior Lower Leg: D/C Home Health Services Medications-please add to medication list.: Wound #1 Left,Anterior Lower Leg: Other: - Vitamin C, Zinc, Multivitamin I have recommended: 1. Silver alginate, DrawTex and a 3 layer Profore wrap to the left lower extremity. 2. If he is going to be following up here he needs to find a PCP to help control his medical issues -- appointment rescheduled for the last week of February 3. again discussed the need to completely give up smoking have discussed the risks benefits and alternatives and all the possible complications 4. Regular visits to the wound center Electronic Signature(s) Signed: 02/20/2016 3:02:10 PM By: Christin Fudge MD, FACS Entered By: Christin Fudge on 02/20/2016 15:02:10 [name], [name] ([id number]) -------------------------------------------------------------------------------- SuperBill Details Patient Name: [name], [name] Date of Service: 02/20/2016 Medical Record Number: [id number] Patient Account Number:  [id number] Date of Birth/Sex: Apr 11, 1965 (51 y.o. Male) Treating RN: Cornell Barman Primary Care Provider: PATIENT, NO Other Clinician: Referring Provider: Loletha Grayer Treating Provider/Extender: Christin Fudge Service Line: Outpatient Weeks in Treatment: 5 Diagnosis Coding ICD-10 Codes Code Description I89.0 Lymphedema, not elsewhere classified L97.222 Non-pressure chronic ulcer of left calf with fat layer exposed E66.01 Morbid (severe) obesity due to excess calories F17.218 Nicotine dependence, cigarettes, with other nicotine-induced disorders L03.116 Cellulitis of left lower limb Facility Procedures CPT4: Description Modifier Quantity Code [id number] (Facility Use Only) [phone] - [place] [id number] LWR LT 1 LEG Physician Procedures CPT4 Code: [id number] Description: [id number] - WC PHYS LEVEL 3 - EST PT ICD-10 Description Diagnosis I89.0 Lymphedema, not elsewhere classified L97.222 Non-pressure chronic ulcer of left calf with fat E66.01 Morbid (severe) obesity due to excess calories Modifier: layer exposed Quantity: 1 Electronic Signature(s) Signed: 02/20/2016 3:02:24 PM By: Christin Fudge MD, FACS Entered By: Christin Fudge on 02/20/2016 15:02:23

## 2016-02-22 NOTE — Progress Notes (Signed)
VANDER, KUEKER ([id number]) Visit Report for 02/20/2016 Arrival Information Details Patient Name: [name], [name]. Date of Service: 02/20/2016 2:30 PM Medical Record Number: [id number] Patient Account Number: [id number] Date of Birth/Sex: 09-Oct-1965 (50 y.o. Male) Treating RN: Cornell Barman Primary Care Arneta Mahmood: PATIENT, NO Other Clinician: Referring Amedee Cerrone: Loletha Grayer Treating Avyonna Wagoner/Extender: Frann Rider in Treatment: 5 Visit Information History Since Last Visit Added or deleted any medications: No Patient Arrived: Ambulatory Any new allergies or adverse reactions: No Arrival Time: 14:35 Had a fall or experienced change in No Accompanied By: sister activities of daily living that may affect Transfer Assistance: None risk of falls: Patient Identification Verified: Yes Signs or symptoms of abuse/neglect since last No Secondary Verification Process Yes visito Completed: Has Dressing in Place as Prescribed: Yes Patient Requires Transmission-Based No Has Compression in Place as Prescribed: Yes Precautions: Pain Present Now: No Patient Has Alerts: Yes Patient Alerts: ASA Electronic Signature(s) Signed: 02/20/2016 6:04:53 PM By: Gretta Cool, RN, BSN, Kim RN, BSN Entered By: Gretta Cool, RN, BSN, Kim on 02/20/2016 14:37:49 Bashore, Oryn Mamie Nick ([id number]) -------------------------------------------------------------------------------- Encounter Discharge Information Details Patient Name: Bell, [name] Date of Service: 02/20/2016 2:30 PM Medical Record Number: [id number] Patient Account Number: [id number] Date of Birth/Sex: 02/24/1965 (51 y.o. Male) Treating RN: Cornell Barman Primary Care Mayanna Garlitz: PATIENT, NO Other Clinician: Referring Arick Mareno: Loletha Grayer Treating Rilea Arutyunyan/Extender: Frann Rider in Treatment: 5 Encounter Discharge Information Items Discharge Pain Level: 0 Discharge Condition: Stable Ambulatory Status: Ambulatory Discharge Destination:  Home Transportation: Private Auto Accompanied By: sister Schedule Follow-up Appointment: Yes Medication Reconciliation completed and provided to Patient/Care Yes Zenobia Kuennen: Provided on Clinical Summary of Care: 02/20/2016 Form Type Recipient Paper Patient RB Electronic Signature(s) Signed: 02/20/2016 3:09:51 PM By: Ruthine Dose Entered By: Ruthine Dose on 02/20/2016 15:09:51 Heydt, Tyson P. ([id number]) -------------------------------------------------------------------------------- Lower Extremity Assessment Details Patient Name: Bell, [name] Date of Service: 02/20/2016 2:30 PM Medical Record Number: [id number] Patient Account Number: [id number] Date of Birth/Sex: 1965/04/18 (51 y.o. Male) Treating RN: Cornell Barman Primary Care Kristjan Derner: PATIENT, NO Other Clinician: Referring Bryse Blanchette: Loletha Grayer Treating Chezney Huether/Extender: Frann Rider in Treatment: 5 Edema Assessment Assessed: [Left: No] [Right: No] E[Left: dema] [Right: :] Calf Left: Right: Point of Measurement: 34 cm From Medial Instep 48.2 cm cm Ankle Left: Right: Point of Measurement: 11 cm From Medial Instep 28.5 cm cm Vascular Assessment Claudication: Claudication Assessment [Left:None] Pulses: Dorsalis Pedis Palpable: [Left:Yes] Posterior Tibial Extremity colors, hair growth, and conditions: Extremity Color: [Left:Red] Hair Growth on Extremity: [Left:Yes] Temperature of Extremity: [Left:Warm] Capillary Refill: [Left:< 3 seconds] Dependent Rubor: [Left:No] Blanched when Elevated: [Left:No] Lipodermatosclerosis: [Left:No] Toe Nail Assessment Left: Right: Thick: Yes Discolored: Yes Deformed: No Improper Length and Hygiene: No BIJON, MINEER ([id number]) Electronic Signature(s) Signed: 02/20/2016 6:04:53 PM By: Gretta Cool, RN, BSN, Kim RN, BSN Entered By: Gretta Cool, RN, BSN, Kim on 02/20/2016 14:49:40 Sheek, Oluwadamilola P.  ([id number]) -------------------------------------------------------------------------------- Multi Wound Chart Details Patient Name: Bell, [name] Date of Service: 02/20/2016 2:30 PM Medical Record Number: [id number] Patient Account Number: [id number] Date of Birth/Sex: 1965-05-30 (51 y.o. Male) Treating RN: Cornell Barman Primary Care Banita Lehn: PATIENT, NO Other Clinician: Referring Alphonse Asbridge: Loletha Grayer Treating Anaya Bovee/Extender: Frann Rider in Treatment: 5 Vital Signs Height(in): 69 Pulse(bpm): 81 Weight(lbs): 316 Blood Pressure 172/86 (mmHg): Body Mass Index(BMI): 47 Temperature(F): 98.1 Respiratory Rate 16 (breaths/min): Photos: [N/A:N/A] Wound Location: Left Lower Leg - Anterior N/A N/A Wounding Event: Gradually Appeared N/A N/A Primary Etiology: Venous Leg Ulcer N/A N/A Comorbid History: Chronic Obstructive N/A N/A Pulmonary Disease (  COPD), Sleep Apnea, Congestive Heart Failure, Coronary Artery Disease, Seizure Disorder Date Acquired: 12/26/2015 N/A N/A Weeks of Treatment: 5 N/A N/A Wound Status: Open N/A N/A Measurements L x W x D 2.8x1.8x0.1 N/A N/A (cm) Area (cm) : 3.958 N/A N/A Volume (cm) : 0.396 N/A N/A % Reduction in Area: 84.70% N/A N/A % Reduction in Volume: 84.70% N/A N/A Classification: Partial Thickness N/A N/A Exudate Amount: Large N/A N/A Exudate Type: Serous N/A N/A Exudate Color: amber N/A N/A Wound Margin: Flat and Intact N/A N/A Granulation Amount: Medium (34-66%) N/A N/A Mccroskey, Benjamim P. ([id number]) Necrotic Amount: Small (1-33%) N/A N/A Exposed Structures: Fascia: No N/A N/A Fat Layer (Subcutaneous Tissue) Exposed: No Tendon: No Muscle: No Joint: No Bone: No Limited to Skin Breakdown Epithelialization: Small (1-33%) N/A N/A Periwound Skin Texture: Excoriation: Yes N/A N/A Induration: No Callus: No Crepitus: No Rash: No Scarring: No Periwound Skin Maceration: No N/A N/A Moisture: Dry/Scaly:  No Periwound Skin Color: Atrophie Blanche: No N/A N/A Cyanosis: No Ecchymosis: No Erythema: No Hemosiderin Staining: No Mottled: No Pallor: No Rubor: No Temperature: No Abnormality N/A N/A Tenderness on Yes N/A N/A Palpation: Wound Preparation: Ulcer Cleansing: Other: N/A N/A soap and water Topical Anesthetic Applied: Other: lidocaine 4% Treatment Notes Wound #1 (Left, Anterior Lower Leg) 1. Cleansed with: May Shower, gently pat wound dry prior to applying new dressing. 2. Anesthetic Topical Lidocaine 4% cream to wound bed prior to debridement 3. Peri-wound Care: Barrier cream 4. Dressing Applied: Aquacel Ag 5. Secondary Dressing Applied ABD Pad Araiza, Zaide P. ([id number]) 7. Secured with 3 Layer Compression System - Left Lower Extremity Electronic Signature(s) Signed: 02/20/2016 3:00:26 PM By: Christin Fudge MD, FACS Entered By: Christin Fudge on 02/20/2016 15:00:26 Bueso, Trajon Mamie Nick ([id number]) -------------------------------------------------------------------------------- Multi-Disciplinary Care Plan Details Patient Name: Googe, Jame P. Date of Service: 02/20/2016 2:30 PM Medical Record Number: [id number] Patient Account Number: [id number] Date of Birth/Sex: 08-11-1965 (51 y.o. Male) Treating RN: Cornell Barman Primary Care Ramon Brant: PATIENT, NO Other Clinician: Referring Micco Bourbeau: Loletha Grayer Treating Levi Klaiber/Extender: Frann Rider in Treatment: 5 Active Inactive ` Abuse / Safety / Falls / Self Care Management Nursing Diagnoses: Potential for falls Goals: Patient will remain injury free Date Initiated: 01/16/2016 Target Resolution Date: 03/11/2016 Goal Status: Active Interventions: Assess fall risk on admission and as needed Assess self care needs on admission and as needed Notes: ` Nutrition Nursing Diagnoses: Imbalanced nutrition Potential for alteratiion in Nutrition/Potential for imbalanced nutrition Goals: Patient/caregiver agrees  to and verbalizes understanding of need to use nutritional supplements and/or vitamins as prescribed Date Initiated: 01/16/2016 Target Resolution Date: 03/11/2016 Goal Status: Active Interventions: Assess patient nutrition upon admission and as needed per policy Notes: ` Orientation to the [place], Braeson P. ([id number]) Nursing Diagnoses: Knowledge deficit related to the wound healing center program Goals: Patient/caregiver will verbalize understanding of the [facility] Date Initiated: 01/16/2016 Target Resolution Date: 03/11/2016 Goal Status: Active Interventions: Provide education on orientation to the wound center Notes: ` Pain, Acute or Chronic Nursing Diagnoses: Pain, acute or chronic: actual or potential Potential alteration in comfort, pain Goals: Patient will verbalize adequate pain control and receive pain control interventions during procedures as needed Date Initiated: 01/16/2016 Target Resolution Date: 03/11/2016 Goal Status: Active Patient/caregiver will verbalize adequate pain control between visits Date Initiated: 01/16/2016 Target Resolution Date: 03/11/2016 Goal Status: Active Patient/caregiver will verbalize comfort level met Date Initiated: 01/16/2016 Target Resolution Date: 03/11/2016 Goal Status: Active Interventions: Assess comfort goal upon admission Complete pain assessment  as per visit requirements Notes: ` Venous Leg Ulcer Nursing Diagnoses: Actual venous Insuffiency (use after diagnosis is confirmed) Knowledge deficit related to disease process and management Potential for venous Insuffiency (use before diagnosis confirmed) TAMARA, KENYON. ([id number]) Goals: Patient will maintain optimal edema control Date Initiated: 01/16/2016 Target Resolution Date: 03/11/2016 Goal Status: Active Patient/caregiver will verbalize understanding of disease process and disease management Date Initiated: 01/16/2016 Target Resolution  Date: 03/11/2016 Goal Status: Active Interventions: Assess peripheral edema status every visit. Provide education on venous insufficiency Notes: ` Wound/Skin Impairment Nursing Diagnoses: Impaired tissue integrity Knowledge deficit related to smoking impact on wound healing Knowledge deficit related to ulceration/compromised skin integrity Goals: Ulcer/skin breakdown will have a volume reduction of 30% by week 4 Date Initiated: 01/16/2016 Target Resolution Date: 03/11/2016 Goal Status: Active Ulcer/skin breakdown will have a volume reduction of 50% by week 8 Date Initiated: 01/16/2016 Target Resolution Date: 03/11/2016 Goal Status: Active Ulcer/skin breakdown will have a volume reduction of 80% by week 12 Date Initiated: 01/16/2016 Target Resolution Date: 03/11/2016 Goal Status: Active Interventions: Assess patient/caregiver ability to perform ulcer/skin care regimen upon admission and as needed Assess ulceration(s) every visit Provide education on smoking Provide education on ulcer and skin care Notes: Electronic Signature(s) Signed: 02/20/2016 6:04:53 PM By: Gretta Cool, RN, BSN, Kim RN, BSN Entered By: Gretta Cool, RN, BSN, Kim on 02/20/2016 14:49:53 Grisanti, Dusan P. ([id number]) Shoaf, Younis P. ([id number]) -------------------------------------------------------------------------------- Pain Assessment Details Patient Name: Nurse, Aiyden P. Date of Service: 02/20/2016 2:30 PM Medical Record Number: [id number] Patient Account Number: [id number] Date of Birth/Sex: 01-16-1965 (51 y.o. Male) Treating RN: Cornell Barman Primary Care Lakiya Cottam: PATIENT, NO Other Clinician: Referring Crystin Lechtenberg: Loletha Grayer Treating Lilyana Lippman/Extender: Frann Rider in Treatment: 5 Active Problems Location of Pain Severity and Description of Pain Patient Has Paino No Site Locations With Dressing Change: No Pain Management and Medication Current Pain Management: Goals for Pain Management Topical or  injectable lidocaine is offered to patient for acute pain when surgical debridement is performed. If needed, Patient is instructed to use over the counter pain medication for the following 24-48 hours after debridement. Wound care MDs do not prescribed pain medications. Patient has chronic pain or uncontrolled pain. Patient has been instructed to make an appointment with their Primary Care Physician for pain management. Electronic Signature(s) Signed: 02/20/2016 6:04:53 PM By: Gretta Cool, RN, BSN, Kim RN, BSN Entered By: Gretta Cool, RN, BSN, Kim on 02/20/2016 14:37:58 Appling, Wendie Chess ([id number]) -------------------------------------------------------------------------------- Patient/Caregiver Education Details Patient Name: Urbanek, Suresh P. Date of Service: 02/20/2016 2:30 PM Medical Record Number: [id number] Patient Account Number: [id number] Date of Birth/Gender: 03/10/1965 (51 y.o. Male) Treating RN: Cornell Barman Primary Care Physician: PATIENT, NO Other Clinician: Referring Physician: Loletha Grayer Treating Physician/Extender: Frann Rider in Treatment: 5 Education Assessment Education Provided To: Patient Education Topics Provided Venous: Handouts: Controlling Swelling with Multilayered Compression Wraps Methods: Demonstration, Explain/Verbal Responses: State content correctly Electronic Signature(s) Signed: 02/20/2016 6:04:53 PM By: Gretta Cool, RN, BSN, Kim RN, BSN Entered By: Gretta Cool, RN, BSN, Kim on 02/20/2016 14:57:34 Wilhelmi, Alvy Mamie Nick ([id number]) -------------------------------------------------------------------------------- Wound Assessment Details Patient Name: Kritikos, Rylon P. Date of Service: 02/20/2016 2:30 PM Medical Record Number: [id number] Patient Account Number: [id number] Date of Birth/Sex: Jun 20, 1965 (51 y.o. Male) Treating RN: Cornell Barman Primary Care Balthazar Dooly: PATIENT, NO Other Clinician: Referring Thomas Mabry: Loletha Grayer Treating Kasyn Rolph/Extender: Frann Rider in Treatment: 5 Wound Status Wound Number: 1 Primary Venous Leg Ulcer Etiology: Wound Location: Left Lower Leg - Anterior Wound Open Wounding Event: Gradually Appeared Status: Date  Acquired: 12/26/2015 Comorbid Chronic Obstructive Pulmonary Disease Weeks Of Treatment: 5 History: (COPD), Sleep Apnea, Congestive Clustered Wound: No Heart Failure, Coronary Artery Disease, Seizure Disorder Photos Wound Measurements Length: (cm) 2.8 Width: (cm) 1.8 Depth: (cm) 0.1 Area: (cm) 3.958 Volume: (cm) 0.396 % Reduction in Area: 84.7% % Reduction in Volume: 84.7% Epithelialization: Small (1-33%) Tunneling: No Undermining: No Wound Description Classification: Partial Thickness Wound Margin: Flat and Intact Exudate Amount: Large Exudate Type: Serous Exudate Color: amber Foul Odor After Cleansing: No Slough/Fibrino Yes Wound Bed Granulation Amount: Medium (34-66%) Exposed Structure Necrotic Amount: Small (1-33%) Fascia Exposed: No Necrotic Quality: Adherent Slough Fat Layer (Subcutaneous Tissue) Exposed: No Tendon Exposed: No Muscle Exposed: No Joint Exposed: No Devol, Khalin P. ([id number]) Bone Exposed: No Limited to Skin Breakdown Periwound Skin Texture Texture Color No Abnormalities Noted: No No Abnormalities Noted: No Callus: No Atrophie Blanche: No Crepitus: No Cyanosis: No Excoriation: Yes Ecchymosis: No Induration: No Erythema: No Rash: No Hemosiderin Staining: No Scarring: No Mottled: No Pallor: No Moisture Rubor: No No Abnormalities Noted: No Dry / Scaly: No Temperature / Pain Maceration: No Temperature: No Abnormality Tenderness on Palpation: Yes Wound Preparation Ulcer Cleansing: Other: soap and water, Topical Anesthetic Applied: Other: lidocaine 4%, Treatment Notes Wound #1 (Left, Anterior Lower Leg) 1. Cleansed with: May Shower, gently pat wound dry prior to applying new dressing. 2. Anesthetic Topical Lidocaine 4% cream  to wound bed prior to debridement 3. Peri-wound Care: Barrier cream 4. Dressing Applied: Aquacel Ag 5. Secondary Dressing Applied ABD Pad 7. Secured with 3 Layer Compression System - Left Lower Extremity Electronic Signature(s) Signed: 02/20/2016 6:04:53 PM By: Gretta Cool, RN, BSN, Kim RN, BSN Entered By: Gretta Cool, RN, BSN, Kim on 02/20/2016 14:45:52 Colt, Nyxon Mamie Nick ([id number]) -------------------------------------------------------------------------------- Vitals Details Patient Name: Hollick, Tandy P. Date of Service: 02/20/2016 2:30 PM Medical Record Number: [id number] Patient Account Number: [id number] Date of Birth/Sex: 05/30/1965 (51 y.o. Male) Treating RN: Cornell Barman Primary Care Layken Beg: PATIENT, NO Other Clinician: Referring Becker Christopher: Loletha Grayer Treating Scarlette Hogston/Extender: Frann Rider in Treatment: 5 Vital Signs Time Taken: 14:38 Temperature (F): 98.1 Height (in): 69 Pulse (bpm): 81 Weight (lbs): 316 Respiratory Rate (breaths/min): 16 Body Mass Index (BMI): 46.7 Blood Pressure (mmHg): 172/86 Reference Range: 80 - 120 mg / dl Electronic Signature(s) Signed: 02/20/2016 6:04:53 PM By: Gretta Cool, RN, BSN, Kim RN, BSN Entered By: Gretta Cool, RN, BSN, Kim on 02/20/2016 14:38:20

## 2016-02-27 ENCOUNTER — Encounter: Payer: BLUE CROSS/BLUE SHIELD | Admitting: Surgery

## 2016-02-27 DIAGNOSIS — I890 Lymphedema, not elsewhere classified: Secondary | ICD-10-CM | POA: Diagnosis not present

## 2016-02-28 NOTE — Progress Notes (Signed)
[name], [name] ([id number]) Visit Report for 02/27/2016 Arrival Information Details Patient Name: [name], [name]. Date of Service: 02/27/2016 2:30 PM Medical Record Number: [id number] Patient Account Number: [id number] Date of Birth/Sex: 1965-04-04 (51 y.o. Male) Treating RN: Montey Hora Primary Care Kobyn Kray: PATIENT, NO Other Clinician: Referring Leven Hoel: Loletha Grayer Treating Liviya Santini/Extender: Frann Rider in Treatment: 6 Visit Information History Since Last Visit Added or deleted any medications: No Patient Arrived: Ambulatory Any new allergies or adverse reactions: No Arrival Time: 14:47 Had a fall or experienced change in No Accompanied By: self activities of daily living that may affect Transfer Assistance: None risk of falls: Patient Identification Verified: Yes Signs or symptoms of abuse/neglect since last No Secondary Verification Process Yes visito Completed: Hospitalized since last visit: No Patient Requires Transmission-Based No Has Dressing in Place as Prescribed: No Precautions: Has Compression in Place as Prescribed: No Patient Has Alerts: Yes Pain Present Now: No Patient Alerts: ASA Electronic Signature(s) Signed: 02/27/2016 5:12:01 PM By: Montey Hora Entered By: Montey Hora on 02/27/2016 14:48:01 [name], [name]. ([id number]) -------------------------------------------------------------------------------- Encounter Discharge Information Details Patient Name: [name], [name]. Date of Service: 02/27/2016 2:30 PM Medical Record Number: [id number] Patient Account Number: [id number] Date of Birth/Sex: 1965/09/10 (51 y.o. Male) Treating RN: Montey Hora Primary Care Jahaan Vanwagner: PATIENT, NO Other Clinician: Referring Ashaz Robling: Loletha Grayer Treating Tammala Weider/Extender: Frann Rider in Treatment: 6 Encounter Discharge Information Items Discharge Pain Level: 0 Discharge Condition: Stable Ambulatory Status:  Ambulatory Discharge Destination: Home Transportation: Private Auto Accompanied By: self Schedule Follow-up Appointment: Yes Medication Reconciliation completed and provided to Patient/Care No Debhora Titus: Provided on Clinical Summary of Care: 02/27/2016 Form Type Recipient Paper Patient RB Electronic Signature(s) Signed: 02/27/2016 4:48:38 PM By: Montey Hora Previous Signature: 02/27/2016 3:29:19 PM Version By: Ruthine Dose Entered By: Montey Hora on 02/27/2016 16:48:38 [name], [name]. ([id number]) -------------------------------------------------------------------------------- Lower Extremity Assessment Details Patient Name: [name], [name]. Date of Service: 02/27/2016 2:30 PM Medical Record Number: [id number] Patient Account Number: [id number] Date of Birth/Sex: Nov 07, 1965 (51 y.o. Male) Treating RN: Montey Hora Primary Care Kashari Chalmers: PATIENT, NO Other Clinician: Referring Tyheem Boughner: Loletha Grayer Treating Jasiri Hanawalt/Extender: Frann Rider in Treatment: 6 Edema Assessment Assessed: [Left: No] [Right: No] Edema: [Left: Ye] [Right: s] Calf Left: Right: Point of Measurement: 34 cm From Medial Instep 48.6 cm cm Ankle Left: Right: Point of Measurement: 11 cm From Medial Instep 28.4 cm cm Vascular Assessment Pulses: Dorsalis Pedis Palpable: [Left:Yes] Posterior Tibial Extremity colors, hair growth, and conditions: Extremity Color: [Left:Hyperpigmented] Hair Growth on Extremity: [Left:No] Temperature of Extremity: [Left:Warm] Capillary Refill: [Left:< 3 seconds] Electronic Signature(s) Signed: 02/27/2016 5:12:01 PM By: Montey Hora Entered By: Montey Hora on 02/27/2016 14:53:48 [name], [name]. ([id number]) -------------------------------------------------------------------------------- Multi Wound Chart Details Patient Name: [name], [name]. Date of Service: 02/27/2016 2:30 PM Medical Record Number: [id number] Patient Account Number:  [id number] Date of Birth/Sex: [birth year] (51 y.o. Male) Treating RN: Montey Hora Primary Care Athalia Setterlund: PATIENT, NO Other Clinician: Referring Seana Underwood: Loletha Grayer Treating Sheree Lalla/Extender: Frann Rider in Treatment: 6 Vital Signs Height(in): 69 Pulse(bpm): 90 Weight(lbs): 316 Blood Pressure 160/86 (mmHg): Body Mass Index(BMI): 47 Temperature(F): 98.3 Respiratory Rate 16 (breaths/min): Photos: [N/A:N/A] Wound Location: Left Lower Leg - Anterior N/A N/A Wounding Event: Gradually Appeared N/A N/A Primary Etiology: Venous Leg Ulcer N/A N/A Comorbid History: Chronic Obstructive N/A N/A Pulmonary Disease (COPD), Sleep Apnea, Congestive Heart Failure, Coronary Artery Disease, Seizure Disorder Date Acquired: 12/26/2015 N/A N/A Weeks of Treatment: 6 N/A N/A Wound Status: Open N/A N/A Measurements L x  W x D 2.7x1.2x0.1 N/A N/A (cm) Area (cm) : 2.545 N/A N/A Volume (cm) : 0.254 N/A N/A % Reduction in Area: 90.20% N/A N/A % Reduction in Volume: 90.20% N/A N/A Classification: Partial Thickness N/A N/A Exudate Amount: Large N/A N/A Exudate Type: Serous N/A N/A Exudate Color: amber N/A N/A Wound Margin: Flat and Intact N/A N/A [name], [name]. ([id number]) Granulation Amount: Large (67-100%) N/A N/A Granulation Quality: Red N/A N/A Necrotic Amount: None Present (0%) N/A N/A Exposed Structures: Fascia: No N/A N/A Fat Layer (Subcutaneous Tissue) Exposed: No Tendon: No Muscle: No Joint: No Bone: No Limited to Skin Breakdown Epithelialization: Medium (34-66%) N/A N/A Periwound Skin Texture: Excoriation: Yes N/A N/A Induration: No Callus: No Crepitus: No Rash: No Scarring: No Periwound Skin Maceration: No N/A N/A Moisture: Dry/Scaly: No Periwound Skin Color: Atrophie Blanche: No N/A N/A Cyanosis: No Ecchymosis: No Erythema: No Hemosiderin Staining: No Mottled: No Pallor: No Rubor: No Temperature: No Abnormality N/A N/A Tenderness on Yes  N/A N/A Palpation: Wound Preparation: Ulcer Cleansing: Other: N/A N/A soap and water Topical Anesthetic Applied: Other: lidocaine 4% Treatment Notes Electronic Signature(s) Signed: 02/27/2016 3:39:42 PM By: Christin Fudge MD, FACS Entered By: Christin Fudge on 02/27/2016 15:39:42 [name], [name] ([id number]) -------------------------------------------------------------------------------- [place] Details Patient Name: [name], [name]. Date of Service: 02/27/2016 2:30 PM Medical Record Number: [id number] Patient Account Number: [id number] Date of Birth/Sex: 12-10-1965 (51 y.o. Male) Treating RN: Montey Hora Primary Care Levon Penning: PATIENT, NO Other Clinician: Referring Arlana Canizales: Loletha Grayer Treating Colin Ellers/Extender: Frann Rider in Treatment: 6 Active Inactive ` Abuse / Safety / Falls / Self Care Management Nursing Diagnoses: Potential for falls Goals: Patient will remain injury free Date Initiated: 01/16/2016 Target Resolution Date: 03/11/2016 Goal Status: Active Interventions: Assess fall risk on admission and as needed Assess self care needs on admission and as needed Notes: ` Nutrition Nursing Diagnoses: Imbalanced nutrition Potential for alteratiion in Nutrition/Potential for imbalanced nutrition Goals: Patient/caregiver agrees to and verbalizes understanding of need to use nutritional supplements and/or vitamins as prescribed Date Initiated: 01/16/2016 Target Resolution Date: 03/11/2016 Goal Status: Active Interventions: Assess patient nutrition upon admission and as needed per policy Notes: ` Orientation to the [place], Seeley Bell. ([id number]) Nursing Diagnoses: Knowledge deficit related to the wound healing center program Goals: Patient/caregiver will verbalize understanding of the [place] Date Initiated: 01/16/2016 Target Resolution Date: 03/11/2016 Goal Status:  Active Interventions: Provide education on orientation to the wound center Notes: ` Pain, Acute or Chronic Nursing Diagnoses: Pain, acute or chronic: actual or potential Potential alteration in comfort, pain Goals: Patient will verbalize adequate pain control and receive pain control interventions during procedures as needed Date Initiated: 01/16/2016 Target Resolution Date: 03/11/2016 Goal Status: Active Patient/caregiver will verbalize adequate pain control between visits Date Initiated: 01/16/2016 Target Resolution Date: 03/11/2016 Goal Status: Active Patient/caregiver will verbalize comfort level met Date Initiated: 01/16/2016 Target Resolution Date: 03/11/2016 Goal Status: Active Interventions: Assess comfort goal upon admission Complete pain assessment as per visit requirements Notes: ` Venous Leg Ulcer Nursing Diagnoses: Actual venous Insuffiency (use after diagnosis is confirmed) Knowledge deficit related to disease process and management Potential for venous Insuffiency (use before diagnosis confirmed) [name], [name] ([id number]) Goals: Patient will maintain optimal edema control Date Initiated: 01/16/2016 Target Resolution Date: 03/11/2016 Goal Status: Active Patient/caregiver will verbalize understanding of disease process and disease management Date Initiated: 01/16/2016 Target Resolution Date: 03/11/2016 Goal Status: Active Interventions: Assess peripheral edema status every visit. Provide education on venous  insufficiency Notes: ` Wound/Skin Impairment Nursing Diagnoses: Impaired tissue integrity Knowledge deficit related to smoking impact on wound healing Knowledge deficit related to ulceration/compromised skin integrity Goals: Ulcer/skin breakdown will have a volume reduction of 30% by week 4 Date Initiated: 01/16/2016 Target Resolution Date: 03/11/2016 Goal Status: Active Ulcer/skin breakdown will have a volume reduction of 50% by week 8 Date Initiated:  01/16/2016 Target Resolution Date: 03/11/2016 Goal Status: Active Ulcer/skin breakdown will have a volume reduction of 80% by week 12 Date Initiated: 01/16/2016 Target Resolution Date: 03/11/2016 Goal Status: Active Interventions: Assess patient/caregiver ability to perform ulcer/skin care regimen upon admission and as needed Assess ulceration(s) every visit Provide education on smoking Provide education on ulcer and skin care Notes: Electronic Signature(s) Signed: 02/27/2016 5:12:01 PM By: Montey Hora Entered By: Montey Hora on 02/27/2016 14:54:21 [name], [name]. ([id number]) [name], [name]. ([id number]) -------------------------------------------------------------------------------- Pain Assessment Details Patient Name: Billick, Syaire Bell. Date of Service: 02/27/2016 2:30 PM Medical Record Number: [id number] Patient Account Number: [id number] Date of Birth/Sex: Mar 23, 1965 (51 y.o. Male) Treating RN: Montey Hora Primary Care Aerik Polan: PATIENT, NO Other Clinician: Referring Monica Zahler: Loletha Grayer Treating Kimberely Mccannon/Extender: Frann Rider in Treatment: 6 Active Problems Location of Pain Severity and Description of Pain Patient Has Paino No Site Locations Pain Management and Medication Current Pain Management: Notes Topical or injectable lidocaine is offered to patient for acute pain when surgical debridement is performed. If needed, Patient is instructed to use over the counter pain medication for the following 24-48 hours after debridement. Wound care MDs do not prescribed pain medications. Patient has chronic pain or uncontrolled pain. Patient has been instructed to make an appointment with their Primary Care Physician for pain management. Electronic Signature(s) Signed: 02/27/2016 5:12:01 PM By: Montey Hora Entered By: Montey Hora on 02/27/2016 14:48:07 [name], [name]  ([id number]) -------------------------------------------------------------------------------- Patient/Caregiver Education Details Patient Name: Spisak, Latonya Bell. Date of Service: 02/27/2016 2:30 PM Medical Record Number: [id number] Patient Account Number: [id number] Date of Birth/Gender: 07-08-1965 (51 y.o. Male) Treating RN: Montey Hora Primary Care Physician: PATIENT, NO Other Clinician: Referring Physician: Loletha Grayer Treating Physician/Extender: Frann Rider in Treatment: 6 Education Assessment Education Provided To: Patient and Caregiver Education Topics Provided Wound/Skin Impairment: Handouts: Other: need for compression hose daily for both legs Methods: Explain/Verbal Responses: State content correctly Electronic Signature(s) Signed: 02/27/2016 5:12:01 PM By: Montey Hora Entered By: Montey Hora on 02/27/2016 16:49:04 [name], [name]. ([id number]) -------------------------------------------------------------------------------- Wound Assessment Details Patient Name: Amaker, Shown Bell. Date of Service: 02/27/2016 2:30 PM Medical Record Number: [id number] Patient Account Number: [id number] Date of Birth/Sex: 03/04/1965 (51 y.o. Male) Treating RN: Montey Hora Primary Care Anne Boltz: PATIENT, NO Other Clinician: Referring Courvoisier Hamblen: Loletha Grayer Treating Ane Conerly/Extender: Frann Rider in Treatment: 6 Wound Status Wound Number: 1 Primary Venous Leg Ulcer Etiology: Wound Location: Left Lower Leg - Anterior Wound Open Wounding Event: Gradually Appeared Status: Date Acquired: 12/26/2015 Comorbid Chronic Obstructive Pulmonary Disease Weeks Of Treatment: 6 History: (COPD), Sleep Apnea, Congestive Clustered Wound: No Heart Failure, Coronary Artery Disease, Seizure Disorder Photos Wound Measurements Length: (cm) 2.7 Width: (cm) 1.2 Depth: (cm) 0.1 Area: (cm) 2.545 Volume: (cm) 0.254 % Reduction in Area: 90.2% % Reduction in  Volume: 90.2% Epithelialization: Medium (34-66%) Tunneling: No Undermining: No Wound Description Classification: Partial Thickness Wound Margin: Flat and Intact Exudate Amount: Large Exudate Type: Serous Exudate Color: amber Foul Odor After Cleansing: No Slough/Fibrino Yes Wound Bed Granulation Amount: Large (67-100%) Exposed Structure Granulation Quality: Red Fascia Exposed: No Necrotic Amount: None Present (0%) Fat  Layer (Subcutaneous Tissue) Exposed: No Tendon Exposed: No [place], Treg Bell. ([id number]) Muscle Exposed: No Joint Exposed: No Bone Exposed: No Limited to Skin Breakdown Periwound Skin Texture Texture Color No Abnormalities Noted: No No Abnormalities Noted: No Callus: No Atrophie Blanche: No Crepitus: No Cyanosis: No Excoriation: Yes Ecchymosis: No Induration: No Erythema: No Rash: No Hemosiderin Staining: No Scarring: No Mottled: No Pallor: No Moisture Rubor: No No Abnormalities Noted: No Dry / Scaly: No Temperature / Pain Maceration: No Temperature: No Abnormality Tenderness on Palpation: Yes Wound Preparation Ulcer Cleansing: Other: soap and water, Topical Anesthetic Applied: Other: lidocaine 4%, Treatment Notes Wound #1 (Left, Anterior Lower Leg) 1. Cleansed with: Clean wound with Normal Saline 2. Anesthetic Topical Lidocaine 4% cream to wound bed prior to debridement 4. Dressing Applied: Foam 5. Secondary Dressing Applied ABD Pad 7. Secured with 4-Layer Compression System - Left Lower Extremity Electronic Signature(s) Signed: 02/27/2016 5:12:01 PM By: Montey Hora Entered By: Montey Hora on 02/27/2016 14:52:25 Zheng, Obrian Bell. ([id number]) -------------------------------------------------------------------------------- Vitals Details Patient Name: Amico, Keante Bell. Date of Service: 02/27/2016 2:30 PM Medical Record Number: [id number] Patient Account Number: [id number] Date of Birth/Sex: 1965-11-17 (51 y.o. Male) Treating  RN: Montey Hora Primary Care Briston Lax: PATIENT, NO Other Clinician: Referring Spencer Peterkin: Loletha Grayer Treating Chemika Nightengale/Extender: Frann Rider in Treatment: 6 Vital Signs Time Taken: 14:58 Temperature (F): 98.3 Height (in): 69 Pulse (bpm): 90 Weight (lbs): 316 Respiratory Rate (breaths/min): 16 Body Mass Index (BMI): 46.7 Blood Pressure (mmHg): 160/86 Reference Range: 80 - 120 mg / dl Electronic Signature(s) Signed: 02/27/2016 5:12:01 PM By: Montey Hora Entered By: Montey Hora on 02/27/2016 14:48:29

## 2016-02-28 NOTE — Progress Notes (Signed)
WALLY, SHEAN ([id number]) Visit Report for 02/27/2016 Chief Complaint Document Details Patient Name: [name], [name]. Date of Service: 02/27/2016 2:30 PM Medical Record Number: [id number] Patient Account Number: [id number] Date of Birth/Sex: September 12, 1965 (51 y.o. Male) Treating RN: Montey Hora Primary Care Provider: PATIENT, NO Other Clinician: Referring Provider: Loletha Grayer Treating Provider/Extender: Frann Rider in Treatment: 6 Information Obtained from: Patient Chief Complaint Patient presents to the wound care center for a consult due non healing wound for bilateral lower extremity edema which she's had for several months Electronic Signature(s) Signed: 02/27/2016 3:39:49 PM By: Christin Fudge MD, FACS Entered By: Christin Fudge on 02/27/2016 15:39:48 [name], [name] ([id number]) -------------------------------------------------------------------------------- HPI Details Patient Name: [name], [name] Date of Service: 02/27/2016 2:30 PM Medical Record Number: [id number] Patient Account Number: [id number] Date of Birth/Sex: [birth year] (51 y.o. Male) Treating RN: Montey Hora Primary Care Provider: PATIENT, NO Other Clinician: Referring Provider: Loletha Grayer Treating Provider/Extender: Frann Rider in Treatment: 6 History of Present Illness Location: a lateral lower extremity edema with swelling and infection of the left lower extremity Quality: Patient reports experiencing a sharp pain to affected area(s). Severity: Patient states wound are getting worse. Duration: Patient has had the wound for > 3 months prior to seeking treatment at the wound center Timing: Pain in wound is constant (hurts all the time) Context: The wound appeared gradually over time Modifying Factors: Consults to this date include:was admitted to the hospital for IV antibiotics and treatment Associated Signs and Symptoms: Patient reports having increase discharge. HPI  Description: 51 year old gentleman who is a current every day smoker and smokes a pack of cigarettes a day for the last 30 years also is known to have had a recent admission to the hospital between December 25 and 01/09/2016 and was admitted for swelling and cellulitis of the left lower extremity. he has a past medical history of COPD, coronary artery disease, hypertension, peptic ulcer and sleep apnea. His admission he was known to have cellulitis the left lower extremity and bilateral lower extremity lymphedema, and was placed on aggressive antibiotics( vancomycin and Zosyn) and Unna boot and change to Ancef later. He was also treated for COPD, essential hypertension and nicotine abuse. An echo done and showed a normal ejection fraction and was started on low-dose Lasix. he was discharged home on Keflex. during the admission of a duplex study was done for possible DVT and there was no evidence of this found in the left lower extremity. 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 In the process of finding a PCP here. 01/23/2016 -- his vascular workup is later this month and his PCP appointment is not too early February. 02/07/2016 -- venous duplex study was done yesterday and has his arterial workup to be done this coming Tuesday. 02/13/2016 -- had a lower extremity venous reflux examination done and reviewed by Dr. Lucky Cowboy. There was no DVT or SVT bilaterally. There was no incompetence of bilateral greater saphenous and small saphenous veins. Moderate interstitial edema of the left calf noted. ABI was done and the right ABI was 1.05 left was 1.11 and it was a triphasic flow. The right TBI was 0.68 and left was 0.63. Atrial duplex was not done due to normal ABIs Was seen by Dr. Leotis Pain on 02/11/2016. After reviewing him he did not see any vascular causes which are causing him the nonhealing wounds and requested he continue with wound  care.  His wounds have healed he would consider him for a lymphedema pump Electronic Signature(s) Signed: 02/27/2016 3:39:53 PM By: Christin Fudge MD, FACS [name], [name] ([id number]) Entered By: Christin Fudge on 02/27/2016 15:39:53 [name], [name] ([id number]) -------------------------------------------------------------------------------- Physical Exam Details Patient Name: [name], [name] Date of Service: 02/27/2016 2:30 PM Medical Record Number: [id number] Patient Account Number: [id number] Date of Birth/Sex: [birth year] (51 y.o. Male) Treating RN: Montey Hora Primary Care Provider: PATIENT, NO Other Clinician: Referring Provider: Loletha Grayer Treating Provider/Extender: Frann Rider in Treatment: 6 Constitutional . Pulse regular. Respirations normal and unlabored. Afebrile. . Eyes Nonicteric. Reactive to light. Ears, Nose, Mouth, and Throat Lips, teeth, and gums WNL.Marland Kitchen Moist mucosa without lesions. Neck supple and nontender. No palpable supraclavicular or cervical adenopathy. Normal sized without goiter. Respiratory WNL. No retractions.. Breath sounds WNL, No rubs, rales, rhonchi, or wheeze.. Cardiovascular Heart rhythm and rate regular, no murmur or gallop.. Pedal Pulses WNL. No clubbing, cyanosis or edema. Chest Breasts symmetical and no nipple discharge.. Breast tissue WNL, no masses, lumps, or tenderness.. Lymphatic No adneopathy. No adenopathy. No adenopathy. Musculoskeletal Adexa without tenderness or enlargement.. Digits and nails w/o clubbing, cyanosis, infection, petechiae, ischemia, or inflammatory conditions.. Integumentary (Hair, Skin) No suspicious lesions. No crepitus or fluctuance. No peri-wound warmth or erythema. No masses.Marland Kitchen Psychiatric Judgement and insight Intact.. No evidence of depression, anxiety, or agitation.. Notes there is excellent resolution of the lymphedema and the wound shows good epithelialization and a minimal open  areas Electronic Signature(s) Signed: 02/27/2016 3:40:18 PM By: Christin Fudge MD, FACS Entered By: Christin Fudge on 02/27/2016 15:40:16 [name], [name] ([id number]) -------------------------------------------------------------------------------- Physician Orders Details Patient Name: [name], [name] Date of Service: 02/27/2016 2:30 PM Medical Record Number: [id number] Patient Account Number: [id number] Date of Birth/Sex: October 31, 1965 (51 y.o. Male) Treating RN: Montey Hora Primary Care Provider: PATIENT, NO Other Clinician: Referring Provider: Loletha Grayer Treating Provider/Extender: Frann Rider in Treatment: 6 Verbal / Phone Orders: Yes Clinician: Montey Hora Read Back and Verified: Yes Diagnosis Coding Wound Cleansing Wound #1 Left,Anterior Lower Leg o Clean wound with Normal Saline. o May Shower, gently pat wound dry prior to applying new dressing. - Remove wrap and shower 1 hour before appointment. Anesthetic Wound #1 Left,Anterior Lower Leg o Topical Lidocaine 4% cream applied to wound bed prior to debridement Skin Barriers/Peri-Wound Care Wound #1 Left,Anterior Lower Leg o Barrier cream Primary Wound Dressing Wound #1 Left,Anterior Lower Leg o Foam Secondary Dressing Wound #1 Left,Anterior Lower Leg o ABD pad Dressing Change Frequency Wound #1 Left,Anterior Lower Leg o Change dressing every week Follow-up Appointments Wound #1 Left,Anterior Lower Leg o Return Appointment in 1 week. Edema Control Wound #1 Left,Anterior Lower Leg o 3 Layer Compression System - Left Lower Extremity - Wrap from base of toes to 3cm below knee. o Elevate legs to the level of the heart and pump ankles as often as possible Ranieri, Pawan P. ([id number]) Additional Orders / Instructions Wound #1 Left,Anterior Lower Leg o Stop Smoking o Increase protein intake. Medications-please add to medication list. Wound #1 Left,Anterior Lower Leg o  Other: - Vitamin C, Zinc, Multivitamin Electronic Signature(s) Signed: 02/27/2016 4:07:08 PM By: Christin Fudge MD, FACS Signed: 02/27/2016 5:12:01 PM By: Montey Hora Entered By: Montey Hora on 02/27/2016 15:07:33 [place], Abrham P. ([id number]) -------------------------------------------------------------------------------- Problem List Details Patient Name: [name], [name] Date of Service: 02/27/2016 2:30 PM Medical Record Number: [id number] Patient Account Number: [id number] Date of Birth/Sex: 11-01-1965 (51 y.o. 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 ([id number]) -------------------------------------------------------------------------------- Progress Note Details Patient Name: [name], [name] Date of Service: 02/27/2016 2:30 PM Medical Record Number: [id number] Patient Account Number: [id number] Date of Birth/Sex: [birth year] (51 y.o. Male) Treating RN: Montey Hora Primary Care Provider: PATIENT, NO Other Clinician: Referring Provider: Loletha Grayer Treating Provider/Extender: Frann Rider in Treatment: 6 Subjective Chief Complaint Information obtained from Patient Patient presents to the wound care center  for a consult due non healing wound for bilateral lower extremity edema which she's had for several months History of Present Illness (HPI) The following HPI elements were documented for the patient's wound: Location: a lateral lower extremity edema with swelling and infection of the left lower extremity Quality: Patient reports experiencing a sharp pain to affected area(s). Severity: Patient states wound are getting worse. Duration: Patient has had the wound for > 3 months prior to seeking treatment at the wound center Timing: Pain in wound is constant (hurts all the time) Context: The wound appeared gradually over time Modifying Factors: Consults to this date include:was admitted to the hospital for IV antibiotics and treatment Associated Signs and Symptoms: Patient reports having increase discharge. 51 year old gentleman who is a current every day smoker and smokes a pack of cigarettes a day for the last 30 years also is known to have had a recent admission to the hospital between December 25 and 01/09/2016 and was admitted for swelling and cellulitis of the left lower extremity. he has a past medical history of COPD, coronary artery disease, hypertension, peptic ulcer and sleep apnea. His admission he was known to have cellulitis the left lower extremity and bilateral lower extremity lymphedema, and was placed on aggressive antibiotics( vancomycin and Zosyn) and Unna boot and change to Ancef later. He was also treated for COPD, essential hypertension and nicotine abuse. An echo done and showed a normal ejection fraction and was started on low-dose Lasix. he was discharged home on Keflex. during the admission of a duplex study was done for possible DVT and there was no evidence of this found in the left lower extremity. 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 In the process of finding a PCP here. 01/23/2016 -- his vascular  workup is later this month and his PCP appointment is not too early February. 02/07/2016 -- venous duplex study was done yesterday and has his arterial workup to be done this coming Tuesday. 02/13/2016 -- had a lower extremity venous reflux examination done and reviewed by Dr. Lucky Cowboy. There was no DVT or SVT bilaterally. There was no incompetence of bilateral greater saphenous and small saphenous veins. Moderate interstitial edema of the left calf noted. ABI was done and the right ABI was 1.05 left was 1.11 and it was a triphasic flow. The right TBI was 0.68 [name], [name] ([id number]) and left was 0.63. Atrial duplex was not done due to normal ABIs Was  seen by Dr. Leotis Pain on 02/11/2016. After reviewing him he did not see any vascular causes which are causing him the nonhealing wounds and requested he continue with wound care. His wounds have healed he would consider him for a lymphedema pump Objective Constitutional Pulse regular. Respirations normal and unlabored. Afebrile. Vitals Time Taken: 2:58 PM, Height: 69 in, Weight: 316 lbs, BMI: 46.7, Temperature: 98.3 F, Pulse: 90 bpm, Respiratory Rate: 16 breaths/min, Blood Pressure: 160/86 mmHg. Eyes Nonicteric. Reactive to light. Ears, Nose, Mouth, and Throat Lips, teeth, and gums WNL.Marland Kitchen Moist mucosa without lesions. Neck supple and nontender. No palpable supraclavicular or cervical adenopathy. Normal sized without goiter. Respiratory WNL. No retractions.. Breath sounds WNL, No rubs, rales, rhonchi, or wheeze.. Cardiovascular Heart rhythm and rate regular, no murmur or gallop.. Pedal Pulses WNL. No clubbing, cyanosis or edema. Chest Breasts symmetical and no nipple discharge.. Breast tissue WNL, no masses, lumps, or tenderness.. Lymphatic No adneopathy. No adenopathy. No adenopathy. Musculoskeletal Adexa without tenderness or enlargement.. Digits and nails w/o clubbing, cyanosis, infection, petechiae, ischemia, or inflammatory  conditions.Marland Kitchen Psychiatric Judgement and insight Intact.. No evidence of depression, anxiety, or agitation.. General Notes: there is excellent resolution of the lymphedema and the wound shows good epithelialization and a minimal open areas [name], [name] ([id number]) Integumentary (Hair, Skin) No suspicious lesions. No crepitus or fluctuance. No peri-wound warmth or erythema. No masses.. Wound #1 status is Open. Original cause of wound was Gradually Appeared. The wound is located on the Left,Anterior Lower Leg. The wound measures 2.7cm length x 1.2cm width x 0.1cm depth; 2.545cm^2 area and 0.254cm^3 volume. The wound is limited to skin breakdown. There is no tunneling or undermining noted. There is a large amount of serous drainage noted. The wound margin is flat and intact. There is large (67-100%) red granulation within the wound bed. There is no necrotic tissue within the wound bed. The periwound skin appearance exhibited: Excoriation. The periwound skin appearance did not exhibit: Callus, Crepitus, Induration, Rash, Scarring, Dry/Scaly, Maceration, Atrophie Blanche, Cyanosis, Ecchymosis, Hemosiderin Staining, Mottled, Pallor, Rubor, Erythema. Periwound temperature was noted as No Abnormality. The periwound has tenderness on palpation. Assessment Active Problems ICD-10 I89.0 - Lymphedema, not elsewhere classified L97.222 - Non-pressure chronic ulcer of left calf with fat layer exposed E66.01 - Morbid (severe) obesity due to excess calories F17.218 - Nicotine dependence, cigarettes, with other nicotine-induced disorders L03.116 - Cellulitis of left lower limb Plan Wound Cleansing: Wound #1 Left,Anterior Lower Leg: Clean wound with Normal Saline. May Shower, gently pat wound dry prior to applying new dressing. - Remove wrap and shower 1 hour before appointment. Anesthetic: Wound #1 Left,Anterior Lower Leg: Topical Lidocaine 4% cream applied to wound bed prior to debridement Skin  Barriers/Peri-Wound Care: Wound #1 Left,Anterior Lower Leg: Barrier cream Primary Wound Dressing: Wound #1 Left,Anterior Lower Leg: Foam Secondary Dressing: DINNIS, JANOWIAK. ([id number]) Wound #1 Left,Anterior Lower Leg: ABD pad Dressing Change Frequency: Wound #1 Left,Anterior Lower Leg: Change dressing every week Follow-up Appointments: Wound #1 Left,Anterior Lower Leg: Return Appointment in 1 week. Edema Control: Wound #1 Left,Anterior Lower Leg: 3 Layer Compression System - Left Lower Extremity - Wrap from base of toes to 3cm below knee. Elevate legs to the level of the heart and pump ankles as often as possible Additional Orders / Instructions: Wound #1 Left,Anterior Lower Leg: Stop Smoking Increase protein intake. Medications-please add to medication list.: Wound #1 Left,Anterior Lower Leg: Other: - Vitamin C, Zinc, Multivitamin I have recommended: 1. foam and a 4 layer Profore  wrap to the left lower extremity. 2. If he is going to be following up here, he needs to find a PCP to help control his medical issues -- appointment rescheduled for the last week of February 3. again discussed the need to completely give up smoking have discussed the risks benefits and alternatives and all the possible complications 4. I have recommended compression stockings of the 20-30 mm variety to be ordered in the duodenal layer variety. 5. Regular visits to the wound center Electronic Signature(s) Signed: 02/27/2016 3:41:43 PM By: Christin Fudge MD, FACS Entered By: Christin Fudge on 02/27/2016 15:41:43 [name], [name] ([id number]) -------------------------------------------------------------------------------- SuperBill Details Patient Name: [name], [name] Date of Service: 02/27/2016 Medical Record Number: [id number] Patient Account Number: [id number] Date of Birth/Sex: 1965-04-13 (51 y.o. Male) Treating RN: Montey Hora Primary Care Provider: PATIENT, NO Other  Clinician: Referring Provider: Loletha Grayer Treating Provider/Extender: Christin Fudge Service Line: Outpatient Weeks in Treatment: 6 Diagnosis Coding ICD-10 Codes Code Description I89.0 Lymphedema, not elsewhere classified L97.222 Non-pressure chronic ulcer of left calf with fat layer exposed E66.01 Morbid (severe) obesity due to excess calories F17.218 Nicotine dependence, cigarettes, with other nicotine-induced disorders L03.116 Cellulitis of left lower limb Facility Procedures CPT4: Description Modifier Quantity Code [id number] (Facility Use Only) [phone] - [place] [id number] LWR LT 1 LEG Physician Procedures CPT4 Code Description: [id number] - WC PHYS LEVEL 3 - EST PT ICD-10 Description Diagnosis I89.0 Lymphedema, not elsewhere classified L97.222 Non-pressure chronic ulcer of left calf with fat E66.01 Morbid (severe) obesity due to excess calories F17.218  Nicotine dependence, cigarettes, with other nicot Modifier: layer exposed ine-induced di Quantity: 1 sorders Electronic Signature(s) Signed: 02/27/2016 4:47:44 PM By: Montey Hora Previous Signature: 02/27/2016 3:42:17 PM Version By: Christin Fudge MD, FACS Entered By: Montey Hora on 02/27/2016 16:47:43

## 2016-03-06 ENCOUNTER — Encounter: Payer: BLUE CROSS/BLUE SHIELD | Admitting: Surgery

## 2016-03-06 DIAGNOSIS — I890 Lymphedema, not elsewhere classified: Secondary | ICD-10-CM | POA: Diagnosis not present

## 2016-03-07 NOTE — Progress Notes (Signed)
Jonathan, [name] ([id number]) Visit Report for 03/06/2016 Arrival Information Details Patient Name: [name], [name]. Date of Service: 03/06/2016 2:30 PM Medical Record Number: [id number] Patient Account Number: [id number] Date of Birth/Sex: 05-13-1965 (51 y.o. Male) Treating RN: Montey Hora Primary Care Swara Donze: PATIENT, NO Other Clinician: Referring Miasha Emmons: Loletha Grayer Treating Imunique Samad/Extender: Frann Rider in Treatment: 7 Visit Information History Since Last Visit Added or deleted any medications: No Patient Arrived: Ambulatory Any new allergies or adverse reactions: No Arrival Time: 14:28 Had a fall or experienced change in No Accompanied By: self activities of daily living that may affect Transfer Assistance: None risk of falls: Patient Identification Verified: Yes Signs or symptoms of abuse/neglect since last No Secondary Verification Process Yes visito Completed: Hospitalized since last visit: No Patient Requires Transmission-Based No Has Dressing in Place as Prescribed: Yes Precautions: Has Compression in Place as Prescribed: Yes Patient Has Alerts: Yes Pain Present Now: No Patient Alerts: ASA Electronic Signature(s) Signed: 03/06/2016 5:08:09 PM By: Montey Hora Entered By: Montey Hora on 03/06/2016 14:31:43 [name], [name] ([id number]) -------------------------------------------------------------------------------- Encounter Discharge Information Details Patient Name: [name], [name] Date of Service: 03/06/2016 2:30 PM Medical Record Number: [id number] Patient Account Number: [id number] Date of Birth/Sex: Apr 12, 1965 (51 y.o. Male) Treating RN: Montey Hora Primary Care Ozzie Knobel: PATIENT, NO Other Clinician: Referring Kriya Westra: Loletha Grayer Treating Yann Biehn/Extender: Frann Rider in Treatment: 7 Encounter Discharge Information Items Discharge Pain Level: 0 Discharge Condition: Stable Ambulatory Status:  Ambulatory Discharge Destination: Home Transportation: Private Auto Accompanied By: self Schedule Follow-up Appointment: Yes Medication Reconciliation completed and provided to Patient/Care No Dazja Houchin: Provided on Clinical Summary of Care: 03/06/2016 Form Type Recipient Paper Patient RB Electronic Signature(s) Signed: 03/06/2016 3:32:17 PM By: Montey Hora Previous Signature: 03/06/2016 3:29:40 PM Version By: Ruthine Dose Entered By: Montey Hora on 03/06/2016 15:32:17 [name], [name] ([id number]) -------------------------------------------------------------------------------- Lower Extremity Assessment Details Patient Name: [name], [name] Date of Service: 03/06/2016 2:30 PM Medical Record Number: [id number] Patient Account Number: [id number] Date of Birth/Sex: 02-08-1965 (51 y.o. Male) Treating RN: Montey Hora Primary Care Yanci Bachtell: PATIENT, NO Other Clinician: Referring Aixa Corsello: Loletha Grayer Treating Ariz Terrones/Extender: Frann Rider in Treatment: 7 Edema Assessment Assessed: [Left: No] [Right: No] Edema: [Left: Ye] [Right: s] Calf Left: Right: Point of Measurement: 34 cm From Medial Instep 46.5 cm cm Ankle Left: Right: Point of Measurement: 11 cm From Medial Instep 27.3 cm cm Vascular Assessment Pulses: Dorsalis Pedis Palpable: [Left:Yes] Posterior Tibial Extremity colors, hair growth, and conditions: Extremity Color: [Left:Hyperpigmented] Hair Growth on Extremity: [Left:No] Temperature of Extremity: [Left:Warm] Capillary Refill: [Left:< 3 seconds] Electronic Signature(s) Signed: 03/06/2016 5:08:09 PM By: Montey Hora Entered By: Montey Hora on 03/06/2016 14:41:21 [name], [name] ([id number]) -------------------------------------------------------------------------------- Multi Wound Chart Details Patient Name: [name], [name] Date of Service: 03/06/2016 2:30 PM Medical Record Number: [id number] Patient Account Number:  [id number] Date of Birth/Sex: June 23, 1965 (51 y.o. Male) Treating RN: Montey Hora Primary Care Deja Kaigler: PATIENT, NO Other Clinician: Referring Tkeyah Burkman: Loletha Grayer Treating Elric Tirado/Extender: Frann Rider in Treatment: 7 Vital Signs Height(in): 69 Pulse(bpm): 87 Weight(lbs): 316 Blood Pressure 154/75 (mmHg): Body Mass Index(BMI): 47 Temperature(F): 98.1 Respiratory Rate 16 (breaths/min): Photos: [N/A:N/A] Wound Location: Left Lower Leg - Anterior N/A N/A Wounding Event: Gradually Appeared N/A N/A Primary Etiology: Venous Leg Ulcer N/A N/A Comorbid History: Chronic Obstructive N/A N/A Pulmonary Disease (COPD), Sleep Apnea, Congestive Heart Failure, Coronary Artery Disease, Seizure Disorder Date Acquired: 12/26/2015 N/A N/A Weeks of Treatment: 7 N/A N/A Wound Status: Open N/A N/A Measurements L x  W x D 0.2x0.2x0.1 N/A N/A (cm) Area (cm) : 0.031 N/A N/A Volume (cm) : 0.003 N/A N/A % Reduction in Area: 99.90% N/A N/A % Reduction in Volume: 99.90% N/A N/A Classification: Partial Thickness N/A N/A Exudate Amount: Large N/A N/A Exudate Type: Serous N/A N/A Exudate Color: amber N/A N/A Wound Margin: Flat and Intact N/A N/A [name], [name] ([id number]) Granulation Amount: Large (67-100%) N/A N/A Granulation Quality: Red N/A N/A Necrotic Amount: None Present (0%) N/A N/A Exposed Structures: Fascia: No N/A N/A Fat Layer (Subcutaneous Tissue) Exposed: No Tendon: No Muscle: No Joint: No Bone: No Limited to Skin Breakdown Epithelialization: Large (67-100%) N/A N/A Periwound Skin Texture: Excoriation: Yes N/A N/A Induration: No Callus: No Crepitus: No Rash: No Scarring: No Periwound Skin Maceration: No N/A N/A Moisture: Dry/Scaly: No Periwound Skin Color: Atrophie Blanche: No N/A N/A Cyanosis: No Ecchymosis: No Erythema: No Hemosiderin Staining: No Mottled: No Pallor: No Rubor: No Temperature: No Abnormality N/A N/A Tenderness on Yes  N/A N/A Palpation: Wound Preparation: Ulcer Cleansing: Other: N/A N/A soap and water Topical Anesthetic Applied: None Treatment Notes Wound #1 (Left, Anterior Lower Leg) 1. Cleansed with: Cleanse wound with antibacterial soap and water 4. Dressing Applied: Foam 7. Secured with 4-Layer Compression System - Left Lower Extremity Electronic Signature(s) ASHVIN, ADELSON ([id number]) Signed: 03/06/2016 4:09:24 PM By: Christin Fudge MD, FACS Entered By: Christin Fudge on 03/06/2016 16:09:24 [name], [name] ([id number]) -------------------------------------------------------------------------------- Multi-Disciplinary Care Plan Details Patient Name: [name], [name] Date of Service: 03/06/2016 2:30 PM Medical Record Number: [id number] Patient Account Number: [id number] Date of Birth/Sex: Sep 23, 1965 (51 y.o. Male) Treating RN: Montey Hora Primary Care Honestii Marton: PATIENT, NO Other Clinician: Referring Takima Encina: Loletha Grayer Treating Miko Markwood/Extender: Frann Rider in Treatment: 7 Active Inactive ` Abuse / Safety / Falls / Self Care Management Nursing Diagnoses: Potential for falls Goals: Patient will remain injury free Date Initiated: 01/16/2016 Target Resolution Date: 03/11/2016 Goal Status: Active Interventions: Assess fall risk on admission and as needed Assess self care needs on admission and as needed Notes: ` Nutrition Nursing Diagnoses: Imbalanced nutrition Potential for alteratiion in Nutrition/Potential for imbalanced nutrition Goals: Patient/caregiver agrees to and verbalizes understanding of need to use nutritional supplements and/or vitamins as prescribed Date Initiated: 01/16/2016 Target Resolution Date: 03/11/2016 Goal Status: Active Interventions: Assess patient nutrition upon admission and as needed per policy Notes: ` Orientation to the [place], Luchiano P. ([id number]) Nursing Diagnoses: Knowledge deficit related to the  wound healing center program Goals: Patient/caregiver will verbalize understanding of the [place] Date Initiated: 01/16/2016 Target Resolution Date: 03/11/2016 Goal Status: Active Interventions: Provide education on orientation to the wound center Notes: ` Pain, Acute or Chronic Nursing Diagnoses: Pain, acute or chronic: actual or potential Potential alteration in comfort, pain Goals: Patient will verbalize adequate pain control and receive pain control interventions during procedures as needed Date Initiated: 01/16/2016 Target Resolution Date: 03/11/2016 Goal Status: Active Patient/caregiver will verbalize adequate pain control between visits Date Initiated: 01/16/2016 Target Resolution Date: 03/11/2016 Goal Status: Active Patient/caregiver will verbalize comfort level met Date Initiated: 01/16/2016 Target Resolution Date: 03/11/2016 Goal Status: Active Interventions: Assess comfort goal upon admission Complete pain assessment as per visit requirements Notes: ` Venous Leg Ulcer Nursing Diagnoses: Actual venous Insuffiency (use after diagnosis is confirmed) Knowledge deficit related to disease process and management Potential for venous Insuffiency (use before diagnosis confirmed) JARL, SELLITTO ([id number]) Goals: Patient will maintain optimal edema control Date Initiated: 01/16/2016 Target Resolution Date: 03/11/2016 Goal Status:  Active Patient/caregiver will verbalize understanding of disease process and disease management Date Initiated: 01/16/2016 Target Resolution Date: 03/11/2016 Goal Status: Active Interventions: Assess peripheral edema status every visit. Provide education on venous insufficiency Notes: ` Wound/Skin Impairment Nursing Diagnoses: Impaired tissue integrity Knowledge deficit related to smoking impact on wound healing Knowledge deficit related to ulceration/compromised skin integrity Goals: Ulcer/skin breakdown will have a volume  reduction of 30% by week 4 Date Initiated: 01/16/2016 Target Resolution Date: 03/11/2016 Goal Status: Active Ulcer/skin breakdown will have a volume reduction of 50% by week 8 Date Initiated: 01/16/2016 Target Resolution Date: 03/11/2016 Goal Status: Active Ulcer/skin breakdown will have a volume reduction of 80% by week 12 Date Initiated: 01/16/2016 Target Resolution Date: 03/11/2016 Goal Status: Active Interventions: Assess patient/caregiver ability to perform ulcer/skin care regimen upon admission and as needed Assess ulceration(s) every visit Provide education on smoking Provide education on ulcer and skin care Notes: Electronic Signature(s) Signed: 03/06/2016 5:08:09 PM By: Montey Hora Entered By: Montey Hora on 03/06/2016 14:48:33 [name], [name] ([id number]) [name], [name] ([id number]) -------------------------------------------------------------------------------- Pain Assessment Details Patient Name: [name], [name] Date of Service: 03/06/2016 2:30 PM Medical Record Number: [id number] Patient Account Number: [id number] Date of Birth/Sex: January 10, 1966 (51 y.o. Male) Treating RN: Montey Hora Primary Care Vedha Tercero: PATIENT, NO Other Clinician: Referring Alann Avey: Loletha Grayer Treating Alesa Echevarria/Extender: Frann Rider in Treatment: 7 Active Problems Location of Pain Severity and Description of Pain Patient Has Paino No Site Locations Pain Management and Medication Current Pain Management: Notes Topical or injectable lidocaine is offered to patient for acute pain when surgical debridement is performed. If needed, Patient is instructed to use over the counter pain medication for the following 24-48 hours after debridement. Wound care MDs do not prescribed pain medications. Patient has chronic pain or uncontrolled pain. Patient has been instructed to make an appointment with their Primary Care Physician for pain management. Electronic Signature(s) Signed:  03/06/2016 5:08:09 PM By: Montey Hora Entered By: Montey Hora on 03/06/2016 14:31:55 [name], Jonathan Mamie Nick ([id number]) -------------------------------------------------------------------------------- Patient/Caregiver Education Details Patient Name: Odem, Karl P. Date of Service: 03/06/2016 2:30 PM Medical Record Number: [id number] Patient Account Number: [id number] Date of Birth/Gender: 11-04-1965 (51 y.o. Male) Treating RN: Montey Hora Primary Care Physician: PATIENT, NO Other Clinician: Referring Physician: Loletha Grayer Treating Physician/Extender: Frann Rider in Treatment: 7 Education Assessment Education Provided To: Patient Education Topics Provided Venous: Handouts: Other: bring compression hose next visit Methods: Explain/Verbal Responses: State content correctly Electronic Signature(s) Signed: 03/06/2016 5:08:09 PM By: Montey Hora Entered By: Montey Hora on 03/06/2016 15:32:42 [name], [name] ([id number]) -------------------------------------------------------------------------------- Wound Assessment Details Patient Name: [name], [name] Date of Service: 03/06/2016 2:30 PM Medical Record Number: [id number] Patient Account Number: [id number] Date of Birth/Sex: February 09, 1965 (51 y.o. Male) Treating RN: Montey Hora Primary Care Minka Knight: PATIENT, NO Other Clinician: Referring Aliegha Paullin: Loletha Grayer Treating Ugo Thoma/Extender: Frann Rider in Treatment: 7 Wound Status Wound Number: 1 Primary Venous Leg Ulcer Etiology: Wound Location: Left Lower Leg - Anterior Wound Open Wounding Event: Gradually Appeared Status: Date Acquired: 12/26/2015 Comorbid Chronic Obstructive Pulmonary Disease Weeks Of Treatment: 7 History: (COPD), Sleep Apnea, Congestive Clustered Wound: No Heart Failure, Coronary Artery Disease, Seizure Disorder Photos Wound Measurements Length: (cm) 0.2 Width: (cm) 0.2 Depth: (cm) 0.1 Area: (cm)  0.031 Volume: (cm) 0.003 % Reduction in Area: 99.9% % Reduction in Volume: 99.9% Epithelialization: Large (67-100%) Tunneling: No Undermining: No Wound Description Classification: Partial Thickness Wound Margin: Flat and Intact Exudate Amount: Large Exudate Type: Serous Exudate Color: amber Foul  Odor After Cleansing: No Slough/Fibrino Yes Wound Bed Granulation Amount: Large (67-100%) Exposed Structure Granulation Quality: Red Fascia Exposed: No Necrotic Amount: None Present (0%) Fat Layer (Subcutaneous Tissue) Exposed: No Tendon Exposed: No Deitrick, Cabell P. ([id number]) Muscle Exposed: No Joint Exposed: No Bone Exposed: No Limited to Skin Breakdown Periwound Skin Texture Texture Color No Abnormalities Noted: No No Abnormalities Noted: No Callus: No Atrophie Blanche: No Crepitus: No Cyanosis: No Excoriation: Yes Ecchymosis: No Induration: No Erythema: No Rash: No Hemosiderin Staining: No Scarring: No Mottled: No Pallor: No Moisture Rubor: No No Abnormalities Noted: No Dry / Scaly: No Temperature / Pain Maceration: No Temperature: No Abnormality Tenderness on Palpation: Yes Wound Preparation Ulcer Cleansing: Other: soap and water, Topical Anesthetic Applied: None Treatment Notes Wound #1 (Left, Anterior Lower Leg) 1. Cleansed with: Cleanse wound with antibacterial soap and water 4. Dressing Applied: Foam 7. Secured with 4-Layer Compression System - Left Lower Extremity Electronic Signature(s) Signed: 03/06/2016 5:08:09 PM By: Montey Hora Entered By: Montey Hora on 03/06/2016 14:48:20 Maniscalco, Randell P. ([id number]) -------------------------------------------------------------------------------- Vitals Details Patient Name: Horsey, Nyzaiah P. Date of Service: 03/06/2016 2:30 PM Medical Record Number: [id number] Patient Account Number: [id number] Date of Birth/Sex: 04-10-1965 (50 y.o. Male) Treating RN: Montey Hora Primary Care Cedrica Brune:  PATIENT, NO Other Clinician: Referring Sharnika Binney: Loletha Grayer Treating Miriana Gaertner/Extender: Frann Rider in Treatment: 7 Vital Signs Time Taken: 14:32 Temperature (F): 98.1 Height (in): 69 Pulse (bpm): 87 Weight (lbs): 316 Respiratory Rate (breaths/min): 16 Body Mass Index (BMI): 46.7 Blood Pressure (mmHg): 154/75 Reference Range: 80 - 120 mg / dl Electronic Signature(s) Signed: 03/06/2016 5:08:09 PM By: Montey Hora Entered By: Montey Hora on 03/06/2016 14:32:29

## 2016-03-07 NOTE — Progress Notes (Signed)
EDI, HEITZMANN ([id number]) Visit Report for 03/06/2016 Chief Complaint Document Details Patient Name: [name], [name]. Date of Service: 03/06/2016 2:30 PM Medical Record Number: [id number] Patient Account Number: [id number] Date of Birth/Sex: [birth year] (51 y.o. Male) Treating RN: Montey Hora Primary Care Provider: PATIENT, NO Other Clinician: Referring Provider: Loletha Grayer Treating Provider/Extender: Frann Rider in Treatment: 7 Information Obtained from: Patient Chief Complaint Patient presents to the wound care center for a consult due non healing wound for bilateral lower extremity edema which she's had for several months Electronic Signature(s) Signed: 03/06/2016 4:09:31 PM By: Christin Fudge MD, FACS Entered By: Christin Fudge on 03/06/2016 16:09:31 Amsden, Janmichael P. ([id number]) -------------------------------------------------------------------------------- HPI Details Patient Name: [name], [name] Date of Service: 03/06/2016 2:30 PM Medical Record Number: [id number] Patient Account Number: [id number] Date of Birth/Sex: Jun 01, 1965 (51 y.o. Male) Treating RN: Montey Hora Primary Care Provider: PATIENT, NO Other Clinician: Referring Provider: Loletha Grayer Treating Provider/Extender: Frann Rider in Treatment: 7 History of Present Illness Location: a lateral lower extremity edema with swelling and infection of the left lower extremity Quality: Patient reports experiencing a sharp pain to affected area(s). Severity: Patient states wound are getting worse. Duration: Patient has had the wound for > 3 months prior to seeking treatment at the wound center Timing: Pain in wound is constant (hurts all the time) Context: The wound appeared gradually over time Modifying Factors: Consults to this date include:was admitted to the hospital for IV antibiotics and treatment Associated Signs and Symptoms: Patient reports having increase discharge. HPI  Description: 51 year old gentleman who is a current every day smoker and smokes a pack of cigarettes a day for the last 30 years also is known to have had a recent admission to the hospital between December 25 and 01/09/2016 and was admitted for swelling and cellulitis of the left lower extremity. he has a past medical history of COPD, coronary artery disease, hypertension, peptic ulcer and sleep apnea. His admission he was known to have cellulitis the left lower extremity and bilateral lower extremity lymphedema, and was placed on aggressive antibiotics( vancomycin and Zosyn) and Unna boot and change to Ancef later. He was also treated for COPD, essential hypertension and nicotine abuse. An echo done and showed a normal ejection fraction and was started on low-dose Lasix. he was discharged home on Keflex. during the admission of a duplex study was done for possible DVT and there was no evidence of this found in the left lower extremity. 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 In the process of finding a PCP here. 01/23/2016 -- his vascular workup is later this month and his PCP appointment is not too early February. 02/07/2016 -- venous duplex study was done yesterday and has his arterial workup to be done this coming Tuesday. 02/13/2016 -- had a lower extremity venous reflux examination done and reviewed by Dr. Lucky Cowboy. There was no DVT or SVT bilaterally. There was no incompetence of bilateral greater saphenous and small saphenous veins. Moderate interstitial edema of the left calf noted. ABI was done and the right ABI was 1.05 left was 1.11 and it was a triphasic flow. The right TBI was 0.68 and left was 0.63. Atrial duplex was not done due to normal ABIs Was seen by Dr. Leotis Pain on 02/11/2016. After reviewing him he did not see any vascular causes which are causing him the nonhealing wounds and requested he continue with wound  care.  His wounds have healed he would consider him for a lymphedema pump Electronic Signature(s) Signed: 03/06/2016 4:09:35 PM By: Christin Fudge MD, FACS CAETANO, HERSHNER ([id number]) Entered By: Christin Fudge on 03/06/2016 16:09:35 Glantz, Blu Mamie Nick ([id number]) -------------------------------------------------------------------------------- Physical Exam Details Patient Name: [name], [name] Date of Service: 03/06/2016 2:30 PM Medical Record Number: [id number] Patient Account Number: [id number] Date of Birth/Sex: September 06, 1965 (51 y.o. Male) Treating RN: Montey Hora Primary Care Provider: PATIENT, NO Other Clinician: Referring Provider: Loletha Grayer Treating Provider/Extender: Frann Rider in Treatment: 7 Constitutional . Pulse regular. Respirations normal and unlabored. Afebrile. . Eyes Nonicteric. Reactive to light. Ears, Nose, Mouth, and Throat Lips, teeth, and gums WNL.Marland Kitchen Moist mucosa without lesions. Neck supple and nontender. No palpable supraclavicular or cervical adenopathy. Normal sized without goiter. Respiratory WNL. No retractions.. Breath sounds WNL, No rubs, rales, rhonchi, or wheeze.. Cardiovascular Heart rhythm and rate regular, no murmur or gallop.. Pedal Pulses WNL. No clubbing, cyanosis or edema. Chest Breasts symmetical and no nipple discharge.. Breast tissue WNL, no masses, lumps, or tenderness.. Lymphatic No adneopathy. No adenopathy. No adenopathy. Musculoskeletal Adexa without tenderness or enlargement.. Digits and nails w/o clubbing, cyanosis, infection, petechiae, ischemia, or inflammatory conditions.. Integumentary (Hair, Skin) No suspicious lesions. No crepitus or fluctuance. No peri-wound warmth or erythema. No masses.Marland Kitchen Psychiatric Judgement and insight Intact.. No evidence of depression, anxiety, or agitation.. Notes his left leg is looking excellent with minimal open areas and microperforations which are oozing minimally. We will  continue to use local care and a 4-layer compression to this leg. Electronic Signature(s) Signed: 03/06/2016 4:10:09 PM By: Christin Fudge MD, FACS Entered By: Christin Fudge on 03/06/2016 16:10:08 Mccown, Kaylum Mamie Nick ([id number]) -------------------------------------------------------------------------------- Physician Orders Details Patient Name: Gange, Josedaniel P. Date of Service: 03/06/2016 2:30 PM Medical Record Number: [id number] Patient Account Number: [id number] Date of Birth/Sex: 06/04/1965 (51 y.o. Male) Treating RN: Montey Hora Primary Care Provider: PATIENT, NO Other Clinician: Referring Provider: Loletha Grayer Treating Provider/Extender: Frann Rider in Treatment: 7 Verbal / Phone Orders: No Diagnosis Coding Wound Cleansing Wound #1 Left,Anterior Lower Leg o Clean wound with Normal Saline. o May Shower, gently pat wound dry prior to applying new dressing. - Remove wrap and shower 1 hour before appointment. Anesthetic Wound #1 Left,Anterior Lower Leg o Topical Lidocaine 4% cream applied to wound bed prior to debridement Skin Barriers/Peri-Wound Care Wound #1 Left,Anterior Lower Leg o Barrier cream Primary Wound Dressing Wound #1 Left,Anterior Lower Leg o Foam Dressing Change Frequency Wound #1 Left,Anterior Lower Leg o Change dressing every week Follow-up Appointments Wound #1 Left,Anterior Lower Leg o Return Appointment in 1 week. Edema Control Wound #1 Left,Anterior Lower Leg o 4-Layer Compression System - Left Lower Extremity o Elevate legs to the level of the heart and pump ankles as often as possible Additional Orders / Instructions Wound #1 Left,Anterior Lower Leg o Stop Smoking o Increase protein intake. RHEN, LORDAN ([id number]) Medications-please add to medication list. Wound #1 Left,Anterior Lower Leg o Other: - Vitamin C, Zinc, Multivitamin Electronic Signature(s) Signed: 03/06/2016 4:26:35 PM By: Christin Fudge MD, FACS Signed: 03/06/2016 5:08:09 PM By: Montey Hora Entered By: Montey Hora on 03/06/2016 15:12:33 Folds, Breck P. ([id number]) -------------------------------------------------------------------------------- Problem List Details Patient Name: Laury, Malcomb P. Date of Service: 03/06/2016 2:30 PM Medical Record Number: [id number] Patient Account Number: [id number] Date of Birth/Sex: 1965-12-11 (51 y.o. 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 ([id number]) -------------------------------------------------------------------------------- Progress Note Details Patient Name: Bartelson, Burlin P. Date of Service: 03/06/2016 2:30 PM Medical Record Number: [id number] Patient Account Number: [id number] Date of Birth/Sex: April 13, 1965 (51 y.o. Male) Treating RN: Montey Hora Primary Care Provider: PATIENT, NO Other Clinician: Referring Provider: Loletha Grayer Treating Provider/Extender: Frann Rider in Treatment: 7 Subjective Chief Complaint Information obtained from Patient Patient presents to the wound care center for a consult due non healing wound for bilateral lower extremity edema which she's had for several  months History of Present Illness (HPI) The following HPI elements were documented for the patient's wound: Location: a lateral lower extremity edema with swelling and infection of the left lower extremity Quality: Patient reports experiencing a sharp pain to affected area(s). Severity: Patient states wound are getting worse. Duration: Patient has had the wound for > 3 months prior to seeking treatment at the wound center Timing: Pain in wound is constant (hurts all the time) Context: The wound appeared gradually over time Modifying Factors: Consults to this date include:was admitted to the hospital for IV antibiotics and treatment Associated Signs and Symptoms: Patient reports having increase discharge. 51 year old gentleman who is a current every day smoker and smokes a pack of cigarettes a day for the last 30 years also is known to have had a recent admission to the hospital between December 25 and 01/09/2016 and was admitted for swelling and cellulitis of the left lower extremity. he has a past medical history of COPD, coronary artery disease, hypertension, peptic ulcer and sleep apnea. His admission he was known to have cellulitis the left lower extremity and bilateral lower extremity lymphedema, and was placed on aggressive antibiotics( vancomycin and Zosyn) and Unna boot and change to Ancef later. He was also treated for COPD, essential hypertension and nicotine abuse. An echo done and showed a normal ejection fraction and was started on low-dose Lasix. he was discharged home on Keflex. during the admission of a duplex study was done for possible DVT and there was no evidence of this found in the left lower extremity. 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 In the process of finding a PCP here. 01/23/2016 -- his vascular workup is later this month and his PCP appointment is not too early February. 02/07/2016 -- venous  duplex study was done yesterday and has his arterial workup to be done this coming Tuesday. 02/13/2016 -- had a lower extremity venous reflux examination done and reviewed by Dr. Lucky Cowboy. There was no DVT or SVT bilaterally. There was no incompetence of bilateral greater saphenous and small saphenous veins. Moderate interstitial edema of the left calf noted. ABI was done and the right ABI was 1.05 left was 1.11 and it was a triphasic flow. The right TBI was 0.68 Sanjurjo, Prateek P. ([id number]) and left was 0.63. Atrial duplex was not done due to normal ABIs Was seen by Dr. Leotis Pain on 02/11/2016. After reviewing him he did not see any vascular causes  which are causing him the nonhealing wounds and requested he continue with wound care. His wounds have healed he would consider him for a lymphedema pump Objective Constitutional Pulse regular. Respirations normal and unlabored. Afebrile. Vitals Time Taken: 2:32 PM, Height: 69 in, Weight: 316 lbs, BMI: 46.7, Temperature: 98.1 F, Pulse: 87 bpm, Respiratory Rate: 16 breaths/min, Blood Pressure: 154/75 mmHg. Eyes Nonicteric. Reactive to light. Ears, Nose, Mouth, and Throat Lips, teeth, and gums WNL.Marland Kitchen Moist mucosa without lesions. Neck supple and nontender. No palpable supraclavicular or cervical adenopathy. Normal sized without goiter. Respiratory WNL. No retractions.. Breath sounds WNL, No rubs, rales, rhonchi, or wheeze.. Cardiovascular Heart rhythm and rate regular, no murmur or gallop.. Pedal Pulses WNL. No clubbing, cyanosis or edema. Chest Breasts symmetical and no nipple discharge.. Breast tissue WNL, no masses, lumps, or tenderness.. Lymphatic No adneopathy. No adenopathy. No adenopathy. Musculoskeletal Adexa without tenderness or enlargement.. Digits and nails w/o clubbing, cyanosis, infection, petechiae, ischemia, or inflammatory conditions.Marland Kitchen Psychiatric Judgement and insight Intact.. No evidence of depression, anxiety, or  agitation.. General Notes: his left leg is looking excellent with minimal open areas and microperforations which are oozing minimally. We will continue to use local care and a 4-layer compression to this leg. BAILEY, WALDECKER ([id number]) Integumentary (Hair, Skin) No suspicious lesions. No crepitus or fluctuance. No peri-wound warmth or erythema. No masses.. Wound #1 status is Open. Original cause of wound was Gradually Appeared. The wound is located on the Left,Anterior Lower Leg. The wound measures 0.2cm length x 0.2cm width x 0.1cm depth; 0.031cm^2 area and 0.003cm^3 volume. The wound is limited to skin breakdown. There is no tunneling or undermining noted. There is a large amount of serous drainage noted. The wound margin is flat and intact. There is large (67-100%) red granulation within the wound bed. There is no necrotic tissue within the wound bed. The periwound skin appearance exhibited: Excoriation. The periwound skin appearance did not exhibit: Callus, Crepitus, Induration, Rash, Scarring, Dry/Scaly, Maceration, Atrophie Blanche, Cyanosis, Ecchymosis, Hemosiderin Staining, Mottled, Pallor, Rubor, Erythema. Periwound temperature was noted as No Abnormality. The periwound has tenderness on palpation. Assessment Active Problems ICD-10 I89.0 - Lymphedema, not elsewhere classified L97.222 - Non-pressure chronic ulcer of left calf with fat layer exposed E66.01 - Morbid (severe) obesity due to excess calories F17.218 - Nicotine dependence, cigarettes, with other nicotine-induced disorders L03.116 - Cellulitis of left lower limb Plan Wound Cleansing: Wound #1 Left,Anterior Lower Leg: Clean wound with Normal Saline. May Shower, gently pat wound dry prior to applying new dressing. - Remove wrap and shower 1 hour before appointment. Anesthetic: Wound #1 Left,Anterior Lower Leg: Topical Lidocaine 4% cream applied to wound bed prior to debridement Skin Barriers/Peri-Wound Care: Wound  #1 Left,Anterior Lower Leg: Barrier cream Primary Wound Dressing: Wound #1 Left,Anterior Lower Leg: Foam Dressing Change Frequency: CIRILO, MORITA. ([id number]) Wound #1 Left,Anterior Lower Leg: Change dressing every week Follow-up Appointments: Wound #1 Left,Anterior Lower Leg: Return Appointment in 1 week. Edema Control: Wound #1 Left,Anterior Lower Leg: 4-Layer Compression System - Left Lower Extremity Elevate legs to the level of the heart and pump ankles as often as possible Additional Orders / Instructions: Wound #1 Left,Anterior Lower Leg: Stop Smoking Increase protein intake. Medications-please add to medication list.: Wound #1 Left,Anterior Lower Leg: Other: - Vitamin C, Zinc, Multivitamin I have recommended: 1. foam and a 4 layer Profore wrap to the left lower extremity. 2. I have recommended compression stockings of the 20-30 mm variety to be ordered in the dual layer variety.  3. Regular visits to the wound center Electronic Signature(s) Signed: 03/06/2016 4:11:01 PM By: Christin Fudge MD, FACS Entered By: Christin Fudge on 03/06/2016 16:11:00 Breach, Mustafa P. ([id number]) -------------------------------------------------------------------------------- SuperBill Details Patient Name: Pella, [name] P. Date of Service: 03/06/2016 Medical Record Number: [id number] Patient Account Number: [id number] Date of Birth/Sex: 1965/08/23 (51 y.o. Male) Treating RN: Montey Hora Primary Care Provider: PATIENT, NO Other Clinician: Referring Provider: Loletha Grayer Treating Provider/Extender: Christin Fudge Service Line: Outpatient Weeks in Treatment: 7 Diagnosis Coding ICD-10 Codes Code Description I89.0 Lymphedema, not elsewhere classified L97.222 Non-pressure chronic ulcer of left calf with fat layer exposed E66.01 Morbid (severe) obesity due to excess calories F17.218 Nicotine dependence, cigarettes, with other nicotine-induced disorders L03.116 Cellulitis of  left lower limb Facility Procedures CPT4: Description Modifier Quantity Code [id number] (Facility Use Only) [phone] - [place] [id number] LWR LT 1 LEG Physician Procedures CPT4 Code: [id number] Description: [id number] - WC PHYS LEVEL 3 - EST PT ICD-10 Description Diagnosis I89.0 Lymphedema, not elsewhere classified L97.222 Non-pressure chronic ulcer of left calf with fat E66.01 Morbid (severe) obesity due to excess calories Modifier: layer exposed Quantity: 1 Electronic Signature(s) Signed: 03/06/2016 4:19:11 PM By: Montey Hora Signed: 03/06/2016 4:26:35 PM By: Christin Fudge MD, FACS Previous Signature: 03/06/2016 4:12:37 PM Version By: Christin Fudge MD, FACS Previous Signature: 03/06/2016 4:11:18 PM Version By: Christin Fudge MD, FACS Entered By: Montey Hora on 03/06/2016 16:19:11

## 2016-03-12 ENCOUNTER — Encounter: Payer: BLUE CROSS/BLUE SHIELD | Attending: Surgery | Admitting: Surgery

## 2016-03-12 DIAGNOSIS — I2510 Atherosclerotic heart disease of native coronary artery without angina pectoris: Secondary | ICD-10-CM | POA: Diagnosis not present

## 2016-03-12 DIAGNOSIS — F329 Major depressive disorder, single episode, unspecified: Secondary | ICD-10-CM | POA: Diagnosis not present

## 2016-03-12 DIAGNOSIS — Z7982 Long term (current) use of aspirin: Secondary | ICD-10-CM | POA: Diagnosis not present

## 2016-03-12 DIAGNOSIS — F17218 Nicotine dependence, cigarettes, with other nicotine-induced disorders: Secondary | ICD-10-CM | POA: Insufficient documentation

## 2016-03-12 DIAGNOSIS — Z885 Allergy status to narcotic agent status: Secondary | ICD-10-CM | POA: Insufficient documentation

## 2016-03-12 DIAGNOSIS — I890 Lymphedema, not elsewhere classified: Secondary | ICD-10-CM | POA: Diagnosis not present

## 2016-03-12 DIAGNOSIS — L03116 Cellulitis of left lower limb: Secondary | ICD-10-CM | POA: Diagnosis not present

## 2016-03-12 DIAGNOSIS — G4730 Sleep apnea, unspecified: Secondary | ICD-10-CM | POA: Diagnosis not present

## 2016-03-12 DIAGNOSIS — Z6841 Body Mass Index (BMI) 40.0 and over, adult: Secondary | ICD-10-CM | POA: Insufficient documentation

## 2016-03-12 DIAGNOSIS — I509 Heart failure, unspecified: Secondary | ICD-10-CM | POA: Diagnosis not present

## 2016-03-12 DIAGNOSIS — J449 Chronic obstructive pulmonary disease, unspecified: Secondary | ICD-10-CM | POA: Insufficient documentation

## 2016-03-12 DIAGNOSIS — I110 Hypertensive heart disease with heart failure: Secondary | ICD-10-CM | POA: Diagnosis not present

## 2016-03-12 DIAGNOSIS — G40909 Epilepsy, unspecified, not intractable, without status epilepticus: Secondary | ICD-10-CM | POA: Diagnosis not present

## 2016-03-12 DIAGNOSIS — L97222 Non-pressure chronic ulcer of left calf with fat layer exposed: Secondary | ICD-10-CM | POA: Insufficient documentation

## 2016-03-13 NOTE — Progress Notes (Signed)
CLOYCE, GNEITING ([id number]) Visit Report for 03/12/2016 Chief Complaint Document Details Patient Name: Epps, [name] Date of Service: 03/12/2016 3:30 PM Medical Record Number: [id number] Patient Account Number: [id number] Date of Birth/Sex: [birth year] (51 y.o. Male) Treating RN: Montey Hora Primary Care Provider: PATIENT, NO Other Clinician: Referring Provider: Loletha Grayer Treating Provider/Extender: Frann Rider in Treatment: 8 Information Obtained from: Patient Chief Complaint Patient presents to the wound care center for a consult due non healing wound for bilateral lower extremity edema which she's had for several months Electronic Signature(s) Signed: 03/12/2016 4:00:28 PM By: Christin Fudge MD, FACS Entered By: Christin Fudge on 03/12/2016 16:00:27 Udell, Rachid P. ([id number]) -------------------------------------------------------------------------------- HPI Details Patient Name: Geigle, Chaynce P. Date of Service: 03/12/2016 3:30 PM Medical Record Number: [id number] Patient Account Number: [id number] Date of Birth/Sex: 1965/11/05 (51 y.o. Male) Treating RN: Montey Hora Primary Care Provider: PATIENT, NO Other Clinician: Referring Provider: Loletha Grayer Treating Provider/Extender: Frann Rider in Treatment: 8 History of Present Illness Location: a lateral lower extremity edema with swelling and infection of the left lower extremity Quality: Patient reports experiencing a sharp pain to affected area(s). Severity: Patient states wound are getting worse. Duration: Patient has had the wound for > 3 months prior to seeking treatment at the wound center Timing: Pain in wound is constant (hurts all the time) Context: The wound appeared gradually over time Modifying Factors: Consults to this date include:was admitted to the hospital for IV antibiotics and treatment Associated Signs and Symptoms: Patient reports having increase discharge. HPI  Description: 51 year old gentleman who is a current every day smoker and smokes a pack of cigarettes a day for the last 30 years also is known to have had a recent admission to the hospital between December 25 and 01/09/2016 and was admitted for swelling and cellulitis of the left lower extremity. he has a past medical history of COPD, coronary artery disease, hypertension, peptic ulcer and sleep apnea. His admission he was known to have cellulitis the left lower extremity and bilateral lower extremity lymphedema, and was placed on aggressive antibiotics( vancomycin and Zosyn) and Unna boot and change to Ancef later. He was also treated for COPD, essential hypertension and nicotine abuse. An echo done and showed a normal ejection fraction and was started on low-dose Lasix. he was discharged home on Keflex. during the admission of a duplex study was done for possible DVT and there was no evidence of this found in the left lower extremity. 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 In the process of finding a PCP here. 01/23/2016 -- his vascular workup is later this month and his PCP appointment is not too early February. 02/07/2016 -- venous duplex study was done yesterday and has his arterial workup to be done this coming Tuesday. 02/13/2016 -- had a lower extremity venous reflux examination done and reviewed by Dr. Lucky Cowboy. There was no DVT or SVT bilaterally. There was no incompetence of bilateral greater saphenous and small saphenous veins. Moderate interstitial edema of the left calf noted. ABI was done and the right ABI was 1.05 left was 1.11 and it was a triphasic flow. The right TBI was 0.68 and left was 0.63. Atrial duplex was not done due to normal ABIs Was seen by Dr. Leotis Pain on 02/11/2016. After reviewing him he did not see any vascular causes which are causing him the nonhealing wounds and requested he continue with wound  care.  His wounds have healed he would consider him for a lymphedema pump Electronic Signature(s) Signed: 03/12/2016 4:00:43 PM By: Christin Fudge MD, FACS SAN, MANSBERGER ([id number]) Entered By: Christin Fudge on 03/12/2016 16:00:43 Bruemmer, Malyk Mamie Nick ([id number]) -------------------------------------------------------------------------------- Physical Exam Details Patient Name: Lejeune, Christerpher P. Date of Service: 03/12/2016 3:30 PM Medical Record Number: [id number] Patient Account Number: [id number] Date of Birth/Sex: [birth year] (51 y.o. Male) Treating RN: Montey Hora Primary Care Provider: PATIENT, NO Other Clinician: Referring Provider: Loletha Grayer Treating Provider/Extender: Frann Rider in Treatment: 8 Constitutional . Pulse regular. Respirations normal and unlabored. Afebrile. . Eyes Nonicteric. Reactive to light. Ears, Nose, Mouth, and Throat Lips, teeth, and gums WNL.Marland Kitchen Moist mucosa without lesions. Neck supple and nontender. No palpable supraclavicular or cervical adenopathy. Normal sized without goiter. Respiratory WNL. No retractions.. Cardiovascular Pedal Pulses WNL. No clubbing, cyanosis or edema. Lymphatic No adneopathy. No adenopathy. No adenopathy. Musculoskeletal Adexa without tenderness or enlargement.. Digits and nails w/o clubbing, cyanosis, infection, petechiae, ischemia, or inflammatory conditions.. Integumentary (Hair, Skin) No suspicious lesions. No crepitus or fluctuance. No peri-wound warmth or erythema. No masses.Marland Kitchen Psychiatric Judgement and insight Intact.. No evidence of depression, anxiety, or agitation.. Notes wound is healed completely and there are no open wounds Electronic Signature(s) Signed: 03/12/2016 4:01:50 PM By: Christin Fudge MD, FACS Entered By: Christin Fudge on 03/12/2016 16:01:50 Bise, Hilbert Mamie Nick ([id number]) -------------------------------------------------------------------------------- Physician Orders  Details Patient Name: Etherington, Byrant P. Date of Service: 03/12/2016 3:30 PM Medical Record Number: [id number] Patient Account Number: [id number] Date of Birth/Sex: 05-12-1965 (51 y.o. Male) Treating RN: Montey Hora Primary Care Provider: PATIENT, NO Other Clinician: Referring Provider: Loletha Grayer Treating Provider/Extender: Frann Rider in Treatment: 8 Verbal / Phone Orders: No Diagnosis Coding Discharge From [facility] Services o Discharge from [place] Signature(s) Signed: 03/12/2016 4:14:08 PM By: Christin Fudge MD, FACS Signed: 03/12/2016 4:53:39 PM By: Montey Hora Entered By: Montey Hora on 03/12/2016 15:56:52 Dettore, Cleven P. ([id number]) -------------------------------------------------------------------------------- Problem List Details Patient Name: Vessell, Theodis P. Date of Service: 03/12/2016 3:30 PM Medical Record Number: [id number] Patient Account Number: [id number] Date of Birth/Sex: 09-01-1965 (51 y.o. 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 ([id number]) -------------------------------------------------------------------------------- Progress Note Details Patient Name: Girouard, Gearld  P. Date of Service: 03/12/2016 3:30 PM Medical Record Number: [id number] Patient Account Number: [id number] Date of Birth/Sex: 09/03/1965 (52 y.o. Male) Treating RN: Montey Hora Primary Care Provider: PATIENT, NO Other Clinician: Referring Provider: Loletha Grayer Treating Provider/Extender: Frann Rider in Treatment: 8 Subjective Chief Complaint Information obtained from Patient Patient presents to the wound care center for a consult due non healing wound for bilateral lower extremity edema which she's had for several months History of Present Illness (HPI) The following HPI elements were documented for the patient's wound: Location: a lateral lower extremity edema with swelling and infection of the left lower extremity Quality: Patient reports experiencing a sharp pain to affected area(s). Severity: Patient states  wound are getting worse. Duration: Patient has had the wound for > 3 months prior to seeking treatment at the wound center Timing: Pain in wound is constant (hurts all the time) Context: The wound appeared gradually over time Modifying Factors: Consults to this date include:was admitted to the hospital for IV antibiotics and treatment Associated Signs and Symptoms: Patient reports having increase discharge. 51 year old gentleman who is a current every day smoker and smokes a pack of cigarettes a day for the last 30 years also is known to have had a recent admission to the hospital between December 25 and 01/09/2016 and was admitted for swelling and cellulitis of the left lower extremity. he has a past medical history of COPD, coronary artery disease, hypertension, peptic ulcer and sleep apnea. His admission he was known to have cellulitis the left lower extremity and bilateral lower extremity lymphedema, and was placed on aggressive antibiotics( vancomycin and Zosyn) and Unna boot and change to Ancef later. He was also treated for COPD, essential hypertension and  nicotine abuse. An echo done and showed a normal ejection fraction and was started on low-dose Lasix. he was discharged home on Keflex. during the admission of a duplex study was done for possible DVT and there was no evidence of this found in the left lower extremity. 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 In the process of finding a PCP here. 01/23/2016 -- his vascular workup is later this month and his PCP appointment is not too early February. 02/07/2016 -- venous duplex study was done yesterday and has his arterial workup to be done this coming Tuesday. 02/13/2016 -- had a lower extremity venous reflux examination done and reviewed by Dr. Lucky Cowboy. There was no DVT or SVT bilaterally. There was no incompetence of bilateral greater saphenous and small saphenous veins. Moderate interstitial edema of the left calf noted. ABI was done and the right ABI was 1.05 left was 1.11 and it was a triphasic flow. The right TBI was 0.68 Esters, Foch P. ([id number]) and left was 0.63. Atrial duplex was not done due to normal ABIs Was seen by Dr. Leotis Pain on 02/11/2016. After reviewing him he did not see any vascular causes which are causing him the nonhealing wounds and requested he continue with wound care. His wounds have healed he would consider him for a lymphedema pump Objective Constitutional Pulse regular. Respirations normal and unlabored. Afebrile. Vitals Time Taken: 3:26 PM, Height: 69 in, Weight: 316 lbs, BMI: 46.7, Temperature: 98.0 F, Pulse: 97 bpm, Respiratory Rate: 18 breaths/min, Blood Pressure: 149/74 mmHg. Eyes Nonicteric. Reactive to light. Ears, Nose, Mouth, and Throat Lips, teeth, and gums WNL.Marland Kitchen Moist mucosa without lesions. Neck supple and nontender. No palpable supraclavicular or cervical adenopathy. Normal sized without goiter. Respiratory WNL. No retractions.. Cardiovascular Pedal Pulses WNL. No clubbing,  cyanosis or edema. Lymphatic No adneopathy. No adenopathy. No adenopathy. Musculoskeletal Adexa without tenderness or enlargement.. Digits and nails w/o clubbing, cyanosis, infection, petechiae, ischemia, or inflammatory conditions.Marland Kitchen Psychiatric Judgement and insight Intact.. No evidence of depression, anxiety, or agitation.. General Notes: wound is healed completely and there are no open wounds Integumentary (Hair, Skin) No suspicious lesions. No crepitus or fluctuance. No peri-wound warmth or erythema. No masses.Marland Kitchen TOKIO, SOBALVARRO ([id number]) Wound #1 status is Healed - Epithelialized. Original cause of wound was Gradually Appeared. The wound is located on the Left,Anterior Lower Leg. The wound measures 0cm length x 0cm width x  0cm depth; 0cm^2 area and 0cm^3 volume. Assessment Active Problems ICD-10 I89.0 - Lymphedema, not elsewhere classified L97.222 - Non-pressure chronic ulcer of left calf with fat layer exposed E66.01 - Morbid (severe) obesity due to excess calories F17.218 - Nicotine dependence, cigarettes, with other nicotine-induced disorders L03.116 - Cellulitis of left lower limb Plan Discharge From [facility] Services: Discharge from [facility] having healed the wounds completely and knowing that he has no vascular intervention to be done I have recommended that he is discharged from the wound care services with careful attention to: 1. Using his compression stockings of the 20-30 mm varietyall day except for bed time. 2. elevation and exercise has been discussed with him in great detail 3. again reminded him about the importance of giving up smoking 4. He will be seen back only if necessary Electronic Signature(s) Signed: 03/12/2016 4:03:55 PM By: Christin Fudge MD, FACS Entered By: Christin Fudge on 03/12/2016 16:03:54 Luthi, Feliberto P. ([id number]) -------------------------------------------------------------------------------- SuperBill Details Patient Name:  Belitz, Jerelle P. Date of Service: 03/12/2016 Medical Record Number: [id number] Patient Account Number: [id number] Date of Birth/Sex: 1965-05-23 (51 y.o. Male) Treating RN: Montey Hora Primary Care Provider: PATIENT, NO Other Clinician: Referring Provider: Loletha Grayer Treating Provider/Extender: Christin Fudge Service Line: Outpatient Weeks in Treatment: 8 Diagnosis Coding ICD-10 Codes Code Description I89.0 Lymphedema, not elsewhere classified L97.222 Non-pressure chronic ulcer of left calf with fat layer exposed E66.01 Morbid (severe) obesity due to excess calories F17.218 Nicotine dependence, cigarettes, with other nicotine-induced disorders L03.116 Cellulitis of left lower limb Facility Procedures CPT4 Code: [id number] Description: [id number] - WOUND CARE VISIT-LEV 2 EST PT Modifier: Quantity: 1 Physician Procedures CPT4 Code Description: [id number] - WC PHYS LEVEL 2 - EST PT ICD-10 Description Diagnosis I89.0 Lymphedema, not elsewhere classified L97.222 Non-pressure chronic ulcer of left calf with fat E66.01 Morbid (severe) obesity due to excess calories F17.218  Nicotine dependence, cigarettes, with other nicot Modifier: layer exposed ine-induced di Quantity: 1 sorders Electronic Signature(s) Signed: 03/12/2016 4:10:45 PM By: Montey Hora Signed: 03/12/2016 4:14:08 PM By: Christin Fudge MD, FACS Previous Signature: 03/12/2016 4:04:07 PM Version By: Christin Fudge MD, FACS Entered By: Montey Hora on 03/12/2016 16:10:44

## 2016-03-13 NOTE — Progress Notes (Signed)
KASHEEM, MAZZARELLA ([id number]) Visit Report for 03/12/2016 Arrival Information Details Patient Name: [name], [name]. Date of Service: 03/12/2016 3:30 PM Medical Record Number: [id number] Patient Account Number: [id number] Date of Birth/Sex: 1965-10-31 (51 y.o. Male) Treating RN: Montey Hora Primary Care Rachelle Edwards: PATIENT, NO Other Clinician: Referring Bralynn Donado: Loletha Grayer Treating Christy Friede/Extender: Frann Rider in Treatment: 8 Visit Information History Since Last Visit Added or deleted any medications: No Patient Arrived: Ambulatory Any new allergies or adverse reactions: No Arrival Time: 15:25 Had a fall or experienced change in No Accompanied By: self activities of daily living that may affect Transfer Assistance: None risk of falls: Patient Identification Verified: Yes Signs or symptoms of abuse/neglect since last No Secondary Verification Process Yes visito Completed: Hospitalized since last visit: No Patient Requires Transmission-Based No Has Dressing in Place as Prescribed: Yes Precautions: Has Compression in Place as Prescribed: Yes Patient Has Alerts: Yes Pain Present Now: No Patient Alerts: ASA Electronic Signature(s) Signed: 03/12/2016 4:53:39 PM By: Montey Hora Entered By: Montey Hora on 03/12/2016 15:25:26 Mote, [place]. ([id number]) -------------------------------------------------------------------------------- Clinic Level of Care Assessment Details Patient Name: [name], [name] Date of Service: 03/12/2016 3:30 PM Medical Record Number: [id number] Patient Account Number: [id number] Date of Birth/Sex: 1965/03/23 (52 y.o. Male) Treating RN: Montey Hora Primary Care Caleigh Rabelo: PATIENT, NO Other Clinician: Referring Leta Bucklin: Loletha Grayer Treating Willistine Ferrall/Extender: Frann Rider in Treatment: 8 Clinic Level of Care Assessment Items TOOL 4 Quantity Score []  - Use when only an EandM is performed on FOLLOW-UP visit  0 ASSESSMENTS - Nursing Assessment / Reassessment X - Reassessment of Co-morbidities (includes updates in patient status) 1 10 X - Reassessment of Adherence to Treatment Plan 1 5 ASSESSMENTS - Wound and Skin Assessment / Reassessment X - Simple Wound Assessment / Reassessment - one wound 1 5 []  - Complex Wound Assessment / Reassessment - multiple wounds 0 []  - Dermatologic / Skin Assessment (not related to wound area) 0 ASSESSMENTS - Focused Assessment X - Circumferential Edema Measurements - multi extremities 1 5 []  - Nutritional Assessment / Counseling / Intervention 0 X - Lower Extremity Assessment (monofilament, tuning fork, pulses) 1 5 []  - Peripheral Arterial Disease Assessment (using hand held doppler) 0 ASSESSMENTS - Ostomy and/or Continence Assessment and Care []  - Incontinence Assessment and Management 0 []  - Ostomy Care Assessment and Management (repouching, etc.) 0 PROCESS - Coordination of Care X - Simple Patient / Family Education for ongoing care 1 15 []  - Complex (extensive) Patient / Family Education for ongoing care 0 []  - Staff obtains Programmer, systems, Records, Test Results / Process Orders 0 []  - Staff telephones HHA, Nursing Homes / Clarify orders / etc 0 []  - Routine Transfer to another Facility (non-emergent condition) 0 Kemler, Tayron P. ([id number]) []  - Routine Hospital Admission (non-emergent condition) 0 []  - New Admissions / Biomedical engineer / Ordering NPWT, Apligraf, etc. 0 []  - Emergency Hospital Admission (emergent condition) 0 X - Simple Discharge Coordination 1 10 []  - Complex (extensive) Discharge Coordination 0 PROCESS - Special Needs []  - Pediatric / Minor Patient Management 0 []  - Isolation Patient Management 0 []  - Hearing / Language / Visual special needs 0 []  - Assessment of Community assistance (transportation, D/C planning, etc.) 0 []  - Additional assistance / Altered mentation 0 []  - Support Surface(s) Assessment (bed, cushion, seat,  etc.) 0 INTERVENTIONS - Wound Cleansing / Measurement X - Simple Wound Cleansing - one wound 1 5 []  - Complex Wound Cleansing - multiple wounds 0 X - Wound  Imaging (photographs - any number of wounds) 1 5 []  - Wound Tracing (instead of photographs) 0 X - Simple Wound Measurement - one wound 1 5 []  - Complex Wound Measurement - multiple wounds 0 INTERVENTIONS - Wound Dressings []  - Small Wound Dressing one or multiple wounds 0 []  - Medium Wound Dressing one or multiple wounds 0 []  - Large Wound Dressing one or multiple wounds 0 []  - Application of Medications - topical 0 []  - Application of Medications - injection 0 INTERVENTIONS - Miscellaneous []  - External ear exam 0 Bar, Naethan P. ([id number]) []  - Specimen Collection (cultures, biopsies, blood, body fluids, etc.) 0 []  - Specimen(s) / Culture(s) sent or taken to Lab for analysis 0 []  - Patient Transfer (multiple staff / Harrel Lemon Lift / Similar devices) 0 []  - Simple Staple / Suture removal (25 or less) 0 []  - Complex Staple / Suture removal (26 or more) 0 []  - Hypo / Hyperglycemic Management (close monitor of Blood Glucose) 0 []  - Ankle / Brachial Index (ABI) - do not check if billed separately 0 X - Vital Signs 1 5 Has the patient been seen at the hospital within the last three years: Yes Total Score: 75 Level Of Care: New/Established - Level 2 Electronic Signature(s) Signed: 03/12/2016 4:53:39 PM By: Montey Hora Entered By: Montey Hora on 03/12/2016 16:10:33 Dobbins, Jewelz P. ([id number]) -------------------------------------------------------------------------------- Encounter Discharge Information Details Patient Name: [name], [name] Date of Service: 03/12/2016 3:30 PM Medical Record Number: [id number] Patient Account Number: [id number] Date of Birth/Sex: 12-12-1965 (51 y.o. Male) Treating RN: Montey Hora Primary Care Billyjack Trompeter: PATIENT, NO Other Clinician: Referring Trentin Knappenberger: Loletha Grayer Treating  Abbagail Scaff/Extender: Frann Rider in Treatment: 8 Encounter Discharge Information Items Discharge Pain Level: 0 Discharge Condition: Stable Ambulatory Status: Ambulatory Discharge Destination: Home Transportation: Private Auto Accompanied By: self Schedule Follow-up Appointment: No Medication Reconciliation completed and provided to Patient/Care No Charli Halle: Provided on Clinical Summary of Care: 03/12/2016 Form Type Recipient Paper Patient RB Electronic Signature(s) Signed: 03/12/2016 4:11:08 PM By: Montey Hora Previous Signature: 03/12/2016 3:57:41 PM Version By: Ruthine Dose Entered By: Montey Hora on 03/12/2016 16:11:08 Crill, Reardan. ([id number]) -------------------------------------------------------------------------------- Lower Extremity Assessment Details Patient Name: [name], [name] Date of Service: 03/12/2016 3:30 PM Medical Record Number: [id number] Patient Account Number: [id number] Date of Birth/Sex: [birth year] (51 y.o. Male) Treating RN: Montey Hora Primary Care Terence Bart: PATIENT, NO Other Clinician: Referring Mycheal Veldhuizen: Loletha Grayer Treating Kataryna Mcquilkin/Extender: Frann Rider in Treatment: 8 Edema Assessment Assessed: [Left: No] [Right: No] Edema: [Left: Ye] [Right: s] Calf Left: Right: Point of Measurement: 34 cm From Medial Instep 45.7 cm cm Ankle Left: Right: Point of Measurement: 11 cm From Medial Instep 26.5 cm cm Vascular Assessment Pulses: Dorsalis Pedis Palpable: [Left:Yes] Posterior Tibial Extremity colors, hair growth, and conditions: Extremity Color: [Left:Hyperpigmented] Hair Growth on Extremity: [Left:No] Temperature of Extremity: [Left:Warm] Capillary Refill: [Left:< 3 seconds] Electronic Signature(s) Signed: 03/12/2016 4:53:39 PM By: Montey Hora Entered By: Montey Hora on 03/12/2016 15:31:53 Reichard, Quame P.  ([id number]) -------------------------------------------------------------------------------- Multi Wound Chart Details Patient Name: [name], [name] Date of Service: 03/12/2016 3:30 PM Medical Record Number: [id number] Patient Account Number: [id number] Date of Birth/Sex: [birth year] (51 y.o. Male) Treating RN: Montey Hora Primary Care Alexanderjames Berg: PATIENT, NO Other Clinician: Referring Kartel Wolbert: Loletha Grayer Treating Rissa Turley/Extender: Frann Rider in Treatment: 8 Vital Signs Height(in): 69 Pulse(bpm): 97 Weight(lbs): 316 Blood Pressure 149/74 (mmHg): Body Mass Index(BMI): 47 Temperature(F): 98.0 Respiratory Rate 18 (breaths/min): Photos: [N/A:N/A] Wound Location: Left, Anterior Lower Leg N/A  N/A Wounding Event: Gradually Appeared N/A N/A Primary Etiology: Venous Leg Ulcer N/A N/A Date Acquired: 12/26/2015 N/A N/A Weeks of Treatment: 8 N/A N/A Wound Status: Healed - Epithelialized N/A N/A Measurements L x W x D 0x0x0 N/A N/A (cm) Area (cm) : 0 N/A N/A Volume (cm) : 0 N/A N/A % Reduction in Area: 100.00% N/A N/A % Reduction in Volume: 100.00% N/A N/A Classification: Partial Thickness N/A N/A Periwound Skin Texture: No Abnormalities Noted N/A N/A Periwound Skin No Abnormalities Noted N/A N/A Moisture: Periwound Skin Color: No Abnormalities Noted N/A N/A Tenderness on No N/A N/A Palpation: Treatment Notes TRINI, WILEMAN ([id number]) Electronic Signature(s) Signed: 03/12/2016 4:00:19 PM By: Christin Fudge MD, FACS Entered By: Christin Fudge on 03/12/2016 16:00:18 Nee, Jawaun Mamie Nick ([id number]) -------------------------------------------------------------------------------- Multi-Disciplinary Care Plan Details Patient Name: [name], [name] Date of Service: 03/12/2016 3:30 PM Medical Record Number: [id number] Patient Account Number: [id number] Date of Birth/Sex: 03/02/1965 (51 y.o. Male) Treating RN: Montey Hora Primary Care Leng Montesdeoca: PATIENT,  NO Other Clinician: Referring Emilygrace Grothe: Loletha Grayer Treating Jaskiran Pata/Extender: Frann Rider in Treatment: 8 Active Inactive Electronic Signature(s) Signed: 03/12/2016 4:09:32 PM By: Montey Hora Entered By: Montey Hora on 03/12/2016 16:09:32 Schiff, Mackie P. ([id number]) -------------------------------------------------------------------------------- Pain Assessment Details Patient Name: [name], [name] Date of Service: 03/12/2016 3:30 PM Medical Record Number: [id number] Patient Account Number: [id number] Date of Birth/Sex: 13-May-1965 (51 y.o. Male) Treating RN: Montey Hora Primary Care Jessiah Steinhart: PATIENT, NO Other Clinician: Referring Tymar Polyak: Loletha Grayer Treating Tamecka Milham/Extender: Frann Rider in Treatment: 8 Active Problems Location of Pain Severity and Description of Pain Patient Has Paino No Site Locations Pain Management and Medication Current Pain Management: Notes Topical or injectable lidocaine is offered to patient for acute pain when surgical debridement is performed. If needed, Patient is instructed to use over the counter pain medication for the following 24-48 hours after debridement. Wound care MDs do not prescribed pain medications. Patient has chronic pain or uncontrolled pain. Patient has been instructed to make an appointment with their Primary Care Physician for pain management. Electronic Signature(s) Signed: 03/12/2016 4:53:39 PM By: Montey Hora Entered By: Montey Hora on 03/12/2016 15:25:36 Tayler, Zeno Mamie Nick ([id number]) -------------------------------------------------------------------------------- Patient/Caregiver Education Details Patient Name: [name], [name] Date of Service: 03/12/2016 3:30 PM Medical Record Number: [id number] Patient Account Number: [id number] Date of Birth/Gender: 06-Mar-1965 (51 y.o. Male) Treating RN: Montey Hora Primary Care Physician: PATIENT, NO Other Clinician: Referring  Physician: Loletha Grayer Treating Physician/Extender: Frann Rider in Treatment: 8 Education Assessment Education Provided To: Patient Education Topics Provided Venous: Handouts: Other: wear compression daily from morning until bedtime Methods: Explain/Verbal Responses: State content correctly Electronic Signature(s) Signed: 03/12/2016 4:53:39 PM By: Montey Hora Entered By: Montey Hora on 03/12/2016 16:12:02 Harlacher, Obryan P. ([id number]) -------------------------------------------------------------------------------- Wound Assessment Details Patient Name: [name], [name] Date of Service: 03/12/2016 3:30 PM Medical Record Number: [id number] Patient Account Number: [id number] Date of Birth/Sex: 04/16/1965 (51 y.o. Male) Treating RN: Montey Hora Primary Care Zealand Boyett: PATIENT, NO Other Clinician: Referring Tadd Holtmeyer: Loletha Grayer Treating Okechukwu Regnier/Extender: Frann Rider in Treatment: 8 Wound Status Wound Number: 1 Primary Etiology: Venous Leg Ulcer Wound Location: Left, Anterior Lower Leg Wound Status: Healed - Epithelialized Wounding Event: Gradually Appeared Date Acquired: 12/26/2015 Weeks Of Treatment: 8 Clustered Wound: No Photos Photo Uploaded By: Montey Hora on 03/12/2016 15:59:13 Wound Measurements Length: (cm) 0 % Reduction Width: (cm) 0 % Reduction Depth: (cm) 0 Area: (cm) 0 Volume: (cm) 0 in Area: 100% in Volume: 100% Wound Description Classification: Partial Thickness Periwound  Skin Texture Texture Color No Abnormalities Noted: No No Abnormalities Noted: No Moisture No Abnormalities Noted: No Electronic Signature(s) Signed: 03/12/2016 4:53:39 PM By: Karrie Doffing, Wendie Chess ([id number]) Entered By: Montey Hora on 03/12/2016 15:46:23 Scoggin, Pearl P. ([id number]) -------------------------------------------------------------------------------- Vitals Details Patient Name: [name], [name] Date of  Service: 03/12/2016 3:30 PM Medical Record Number: [id number] Patient Account Number: [id number] Date of Birth/Sex: 1965/02/13 (51 y.o. Male) Treating RN: Montey Hora Primary Care Keontre Defino: PATIENT, NO Other Clinician: Referring Cal Gindlesperger: Loletha Grayer Treating Tashanti Dalporto/Extender: Frann Rider in Treatment: 8 Vital Signs Time Taken: 15:26 Temperature (F): 98.0 Height (in): 69 Pulse (bpm): 97 Weight (lbs): 316 Respiratory Rate (breaths/min): 18 Body Mass Index (BMI): 46.7 Blood Pressure (mmHg): 149/74 Reference Range: 80 - 120 mg / dl Electronic Signature(s) Signed: 03/12/2016 4:53:39 PM By: Montey Hora Entered By: Montey Hora on 03/12/2016 15:27:51

## 2016-03-24 ENCOUNTER — Encounter (INDEPENDENT_AMBULATORY_CARE_PROVIDER_SITE_OTHER): Payer: Self-pay | Admitting: Vascular Surgery

## 2016-03-24 ENCOUNTER — Ambulatory Visit (INDEPENDENT_AMBULATORY_CARE_PROVIDER_SITE_OTHER): Payer: BLUE CROSS/BLUE SHIELD | Admitting: Vascular Surgery

## 2016-03-24 VITALS — BP 172/97 | HR 96 | Resp 16 | Wt 336.0 lb

## 2016-03-24 DIAGNOSIS — Z6841 Body Mass Index (BMI) 40.0 and over, adult: Secondary | ICD-10-CM | POA: Diagnosis not present

## 2016-03-24 DIAGNOSIS — E669 Obesity, unspecified: Secondary | ICD-10-CM | POA: Diagnosis not present

## 2016-03-24 DIAGNOSIS — I890 Lymphedema, not elsewhere classified: Secondary | ICD-10-CM

## 2016-03-24 DIAGNOSIS — I10 Essential (primary) hypertension: Secondary | ICD-10-CM | POA: Diagnosis not present

## 2016-03-24 DIAGNOSIS — IMO0001 Reserved for inherently not codable concepts without codable children: Secondary | ICD-10-CM

## 2016-03-24 DIAGNOSIS — F17200 Nicotine dependence, unspecified, uncomplicated: Secondary | ICD-10-CM | POA: Diagnosis not present

## 2016-03-24 DIAGNOSIS — E785 Hyperlipidemia, unspecified: Secondary | ICD-10-CM

## 2016-03-24 NOTE — Progress Notes (Signed)
MRN : [id number]  [name] is a 51 y.o. ([birth year]) male who presents with chief complaint of  Chief Complaint  Patient presents with  . Follow-up  .  History of Present Illness: Patient returns today in follow up of Leg swelling and ulceration. His ulceration has healed under the therapy from the wound care center. They provided excellent wound care for several weeks for him. He has been placed in compression stockings which she is wearing daily. This swelling is still significant but a little bit better than it was at his last visit. His legs are still painful and heavy.       Past Medical History:  Diagnosis Date  . COPD (chronic obstructive pulmonary disease) ([place])   . Coronary artery disease   . Hypertension   . Peptic ulcer   . Sleep apnea     No past surgical history on file.       Family History  Problem Relation Age of Onset  . Diabetes Mother   . Diabetes Father   . Stomach cancer Father   No bleeding or clotting disorders  Social History      Social History  Substance Use Topics  . Smoking status: Current Every Day Smoker    Packs/day: 1.00    Years: 30.00    Types: Cigarettes  . Smokeless tobacco: Never Used  . Alcohol use No  No IVDU      Allergies  Allergen Reactions  . Shellfish Allergy Swelling  . Codeine Rash  . Lisinopril Cough          Current Outpatient Prescriptions  Medication Sig Dispense Refill  . aspirin 325 MG EC tablet Take 325 mg by mouth daily.    Marland Kitchen aspirin-acetaminophen-caffeine (EXCEDRIN MIGRAINE) 250-250-65 MG tablet Take by mouth every 6 (six) hours as needed for headache.    Marland Kitchen atorvastatin (LIPITOR) 20 MG tablet Take 20 mg by mouth daily.    Marland Kitchen buPROPion (WELLBUTRIN SR) 150 MG 12 hr tablet Take 150 mg by mouth 2 (two) times daily.    . Fluticasone-Salmeterol (ADVAIR DISKUS) 250-50 MCG/DOSE AEPB Inhale 1 puff into the lungs 2 (two) times daily.    . furosemide (LASIX) 40 MG tablet  Take 1 tablet (40 mg total) by mouth daily. 30 tablet 0  . loratadine (CLARITIN) 10 MG tablet Take 10 mg by mouth daily.    Marland Kitchen losartan (COZAAR) 50 MG tablet Take 1 tablet (50 mg total) by mouth daily. 30 tablet 0  . nicotine (NICODERM CQ - DOSED IN MG/24 HOURS) 21 mg/24hr patch Place 1 patch (21 mg total) onto the skin daily. 28 patch 0  . omeprazole (PRILOSEC) 20 MG capsule Take 20 mg by mouth 2 (two) times daily.    Marland Kitchen oxybutynin (DITROPAN-XL) 10 MG 24 hr tablet Take 10 mg by mouth at bedtime.    Marland Kitchen umeclidinium bromide (INCRUSE ELLIPTA) 62.5 MCG/INH AEPB Inhale 1 puff into the lungs daily.    . cephALEXin (KEFLEX) 500 MG capsule Take 1 capsule (500 mg total) by mouth 4 (four) times daily. (Patient not taking: Reported on 02/11/2016) 40 capsule 0  . oxyCODONE-acetaminophen (PERCOCET/ROXICET) 5-325 MG tablet Take 1 tablet by mouth every 6 (six) hours as needed for moderate pain. (Patient not taking: Reported on 02/11/2016) 15 tablet 0   No current facility-administered medications for this visit.       REVIEW OF SYSTEMS (Negative unless checked)  Constitutional: [] Weight loss  [] Fever  [] Chills Cardiac: [] Chest pain   []   Chest pressure   [] Palpitations   [] Shortness of breath when laying flat   [] Shortness of breath at rest   [x] Shortness of breath with exertion. Vascular:  [] Pain in legs with walking   [x] Pain in legs at rest   [] Pain in legs when laying flat   [] Claudication   [] Pain in feet when walking  [] Pain in feet at rest  [] Pain in feet when laying flat   [] History of DVT   [] Phlebitis   [x] Swelling in legs   [] Varicose veins   [x] Non-healing ulcers Pulmonary:   [] Uses home oxygen   [] Productive cough   [] Hemoptysis   [] Wheeze  [x] COPD   [] Asthma Neurologic:  [] Dizziness  [] Blackouts   [] Seizures   [] History of stroke   [] History of TIA  [] Aphasia   [] Temporary blindness   [] Dysphagia   [] Weakness or numbness in arms   [] Weakness or numbness in legs Musculoskeletal:   [] Arthritis   [] Joint swelling   [x] Joint pain   [] Low back pain Hematologic:  [] Easy bruising  [] Easy bleeding   [] Hypercoagulable state   [] Anemic  [] Hepatitis Gastrointestinal:  [] Blood in stool   [] Vomiting blood  [] Gastroesophageal reflux/heartburn   [] Abdominal pain Genitourinary:  [] Chronic kidney disease   [] Difficult urination  [] Frequent urination  [] Burning with urination   [] Hematuria Skin:  [] Rashes   [x] Ulcers   [x] Wounds Psychological:  [] History of anxiety   []  History of major depression.    Physical Exam BP (!) 160/97   Pulse 87   Resp 18   Ht 5' 9"  (1.753 m)   Wt (!) 322 lb 9.6 oz (146.3 kg)   BMI 47.64 kg/m  Gen:  WD/WN, NAD. Morbidly obese Head: [place]/AT, No temporalis wasting. Prominent temp pulse not noted. Ear/Nose/Throat: Hearing grossly intact, nares w/o erythema or drainage, oropharynx w/o Erythema/Exudate Eyes: Conjunctiva clear, sclera non-icteric  Neck: trachea midline.  No JVD.  Pulmonary:  Good air movement, respirations not labored, no use of accessory muscles.  Cardiac: RRR, normal S1, S2. Vascular:  Vessel Right Left  Radial Palpable Palpable  Ulnar Palpable Palpable  Brachial Palpable Palpable  Carotid Palpable, without bruit Palpable, without bruit  Aorta Not palpable N/A  Femoral Palpable Palpable  Popliteal Not Palpable Not Palpable  PT Not Palpable Not Palpable  DP Palpable 1+ Palpable   Gastrointestinal: soft, non-tender/non-distended. No guarding/reflex. No masses, surgical incisions, or scars. Musculoskeletal: M/S 5/5 throughout. Marked stasis changes are present on the right leg. Moderate stasis changes present in the left leg.  No deformity or atrophy. 1-2+ right lower extremity edema, 2+ left lower extremity edema. Neurologic: Sensation grossly intact in extremities.  Symmetrical.  Speech is fluent. Motor exam as listed above. Psychiatric: Judgment intact, Mood & affect appropriate for pt's clinical situation. Dermatologic: Wounds  have currently healed and skin is intact. Lymph : No Cervical, Axillary, or Inguinal lymphadenopathy.    Labs Recent Results (from the past 2160 hour(s))  CBC with Differential     Status: Abnormal   Collection Time: 01/06/16 12:05 PM  Result Value Ref Range   WBC 10.5 3.8 - 10.6 K/uL   RBC 5.39 4.40 - 5.90 MIL/uL   Hemoglobin 16.0 13.0 - 18.0 g/dL   HCT 46.4 40.0 - 52.0 %   MCV 86.2 80.0 - 100.0 fL   MCH 29.6 26.0 - 34.0 pg   MCHC 34.4 32.0 - 36.0 g/dL   RDW 14.6 (H) 11.5 - 14.5 %   Platelets 218 150 - 440 K/uL   Neutrophils Relative %  75 %   Neutro Abs 7.9 (H) 1.4 - 6.5 K/uL   Lymphocytes Relative 14 %   Lymphs Abs 1.5 1.0 - 3.6 K/uL   Monocytes Relative 7 %   Monocytes Absolute 0.8 0.2 - 1.0 K/uL   Eosinophils Relative 3 %   Eosinophils Absolute 0.3 0 - 0.7 K/uL   Basophils Relative 1 %   Basophils Absolute 0.1 0 - 0.1 K/uL  Basic metabolic panel     Status: Abnormal   Collection Time: 01/06/16 12:05 PM  Result Value Ref Range   Sodium 135 135 - 145 mmol/L   Potassium 4.5 3.5 - 5.1 mmol/L   Chloride 102 101 - 111 mmol/L   CO2 25 22 - 32 mmol/L   Glucose, Bld 110 (H) 65 - 99 mg/dL   BUN 17 6 - 20 mg/dL   Creatinine, Ser 0.96 0.61 - 1.24 mg/dL   Calcium 9.3 8.9 - 10.3 mg/dL   GFR calc non Af Amer >60 >60 mL/min   GFR calc Af Amer >60 >60 mL/min    Comment: (NOTE) The eGFR has been calculated using the CKD EPI equation. This calculation has not been validated in all clinical situations. eGFR's persistently <60 mL/min signify possible Chronic Kidney Disease.    Anion gap 8 5 - 15  Blood culture (routine x 2)     Status: None   Collection Time: 01/06/16 12:06 PM  Result Value Ref Range   Specimen Description BLOOD LEFT ANTECUBITAL    Special Requests      BOTTLES DRAWN AEROBIC AND ANAEROBIC  AER 10CC ANA [place]   Culture NO GROWTH 5 DAYS    Report Status 01/11/2016 FINAL   Blood culture (routine x 2)     Status: None   Collection Time: 01/06/16 12:25 PM  Result  Value Ref Range   Specimen Description BLOOD RIGHT HAND    Special Requests      BOTTLES DRAWN AEROBIC AND ANAEROBIC  AER [place] ANA 11CC   Culture NO GROWTH 5 DAYS    Report Status 01/11/2016 FINAL   MRSA PCR Screening     Status: None   Collection Time: 01/06/16  2:18 PM  Result Value Ref Range   MRSA by PCR NEGATIVE NEGATIVE    Comment:        The GeneXpert MRSA Assay (FDA approved for NASAL specimens only), is one component of a comprehensive MRSA colonization surveillance program. It is not intended to diagnose MRSA infection nor to guide or monitor treatment for MRSA infections.   Basic metabolic panel     Status: Abnormal   Collection Time: 01/07/16  5:00 AM  Result Value Ref Range   Sodium 138 135 - 145 mmol/L   Potassium 4.8 3.5 - 5.1 mmol/L   Chloride 103 101 - 111 mmol/L   CO2 29 22 - 32 mmol/L   Glucose, Bld 113 (H) 65 - 99 mg/dL   BUN 14 6 - 20 mg/dL   Creatinine, Ser 1.15 0.61 - 1.24 mg/dL   Calcium 9.0 8.9 - 10.3 mg/dL   GFR calc non Af Amer >60 >60 mL/min   GFR calc Af Amer >60 >60 mL/min    Comment: (NOTE) The eGFR has been calculated using the CKD EPI equation. This calculation has not been validated in all clinical situations. eGFR's persistently <60 mL/min signify possible Chronic Kidney Disease.    Anion gap 6 5 - 15  CBC     Status: Abnormal   Collection Time: 01/07/16  5:00 AM  Result Value Ref Range   WBC 9.2 3.8 - 10.6 K/uL   RBC 5.08 4.40 - 5.90 MIL/uL   Hemoglobin 14.8 13.0 - 18.0 g/dL   HCT 43.7 40.0 - 52.0 %   MCV 86.1 80.0 - 100.0 fL   MCH 29.1 26.0 - 34.0 pg   MCHC 33.7 32.0 - 36.0 g/dL   RDW 14.8 (H) 11.5 - 14.5 %   Platelets 209 150 - 440 K/uL  ECHOCARDIOGRAM COMPLETE     Status: None   Collection Time: 01/08/16  6:55 PM  Result Value Ref Range   Weight 5,056 oz   Height 69 in   BP 138/75 mmHg    Radiology No results found.   Assessment/Plan  Essential hypertension, benign blood pressure control important in reducing  the progression of atherosclerotic disease. On appropriate oral medications.   Hyperlipidemia lipid control important in reducing the progression of atherosclerotic disease. Continue statin therapy   Tobacco dependence We had a discussion for approximately 3 minutes regarding the absolute need for smoking cessation due to the deleterious nature of tobacco on the vascular system. We discussed the tobacco use would diminish patency of any intervention, and likely significantly worsen progressio of disease. We discussed multiple agents for quitting including replacement therapy or medications to reduce cravings such as Chantix. The patient voices their understanding of the importance of smoking cessation.   Obesity Discussed that weight loss would be very beneficial for wound healing and leg swelling.  Lymphedema The patient's wounds have healed. He does still have significant swelling. He is now wearing compression stockings daily and this has helped the swelling a little bit. I do believe he has lymphedema from chronic scarring of lymphatic channels. This is likely secondary lymphedema from cellulitis/ulceration in an obese patient with chronic leg swelling. 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 At this point, I believe the lymphedema pump be an excellent adjuvant therapy to improve his swelling. This would be in addition to compression stockings, weight loss, exercise, and elevation which he has already been advised of. We will try to obtain the lymphedema pump in the near future.    Leotis Pain, MD  03/24/2016 10:37 AM    This note was created with Dragon medical transcription system.  Any errors from dictation are purely unintentional

## 2016-03-24 NOTE — Patient Instructions (Signed)
Lymphedema Lymphedema is swelling that is caused by the abnormal collection of lymph under the skin. Lymph is fluid from the tissues in your body that travels in the lymphatic system. This system is part of the immune system and includes lymph nodes and lymph vessels. The lymph vessels collect and carry the excess fluid, fats, proteins, and wastes from the tissues of the body to the bloodstream. This system also works to clean and remove bacteria and waste products from the body. Lymphedema occurs when the lymphatic system is blocked. When the lymph vessels or lymph nodes are blocked or damaged, lymph does not drain properly, causing an abnormal buildup of lymph. This leads to swelling in the arms or legs. Lymphedema cannot be cured by medicines, but various methods can be used to help reduce the swelling. What are the causes? There are two types of lymphedema. Primary lymphedema is caused by the absence or abnormality of the lymph vessel at birth. Secondary lymphedema is more common. It occurs when the lymph vessel is damaged or blocked. Common causes of lymph vessel blockage include:  Skin infection, such as cellulitis.  Infection by parasites (filariasis).  Injury.  Cancer.  Radiation therapy.  Formation of scar tissue.  Surgery. What are the signs or symptoms? Symptoms of this condition include:  Swelling of the arm or leg.  A heavy or tight feeling in the arm or leg.  Swelling of the feet, toes, or fingers. Shoes or rings may fit more tightly than before.  Redness of the skin over the affected area.  Limited movement of the affected limb.  Sensitivity to touch or discomfort in the affected limb. How is this diagnosed? This condition may be diagnosed with:  A physical exam.  Medical history.  Bioimpedance spectroscopy. In this test, painless electrical currents are used to measure fluid levels in your body.  Imaging tests, such as:  Lymphoscintigraphy. In this test, a  low dose of a radioactive substance is injected to trace the flow of lymph through the lymph vessels.  MRI.  CT scan.  Duplex ultrasound. This test uses sound waves to produce images of the vessels and the blood flow on a screen.  Lymphangiography. In this test, a contrast dye is injected into the lymph vessel to help show blockages. How is this treated? Treatment for this condition may depend on the cause. Treatment may include:  Exercise. Certain exercises can help fluid move out of the affected limb.  Massage. Gentle massage of the affected limb can help move the fluid out of the area.  Compression. Various methods may be used to apply pressure to the affected limb in order to reduce the swelling.  Wearing compression stockings or sleeves on the affected limb.  Bandaging the affected limb.  Using an external pump that is attached to a sleeve that alternates between applying pressure and releasing pressure.  Surgery. This is usually only done for severe cases. For example, surgery may be done if you have trouble moving the limb or if the swelling does not get better with other treatments. If an underlying condition is causing the lymphedema, treatment for that condition is needed. For example, antibiotic medicines may be used to treat an infection. Follow these instructions at home: Activities   Exercise regularly as directed by your health care provider.  Do not sit with your legs crossed.  When possible, keep the affected limb raised (elevated) above the level of your heart.  Avoid carrying things with an arm that is   affected by lymphedema.  Remember that the affected area is more likely to become injured or infected.  Take these steps to help prevent infection:  Keep the affected area clean and dry.  Protect your skin from cuts. For example, you should use gloves while cooking or gardening. Do not walk barefoot. If you shave the affected area, use an electric  razor. General instructions   Take medicines only as directed by your health care provider.  Eat a healthy diet that includes a lot of fruits and vegetables.  Do not wear tight clothes, shoes, or jewelry.  Do not use heating pads over the affected area.  Avoid having blood pressure checked on the affected limb.  Keep all follow-up visits as directed by your health care provider. This is important. Contact a health care provider if:  You continue to have swelling in your limb.  You have a fever.  You have a cut that does not heal.  You have redness or pain in the affected area.  You have new swelling in your limb that comes on suddenly.  You develop purplish spots or sores (lesions) on your limb. Get help right away if:  You have a skin rash.  You have chills or sweats.  You have shortness of breath. This information is not intended to replace advice given to you by your health care provider. Make sure you discuss any questions you have with your health care provider. Document Released: 10/26/2006 Document Revised: 09/05/2015 Document Reviewed: 12/06/2013 Elsevier Interactive Patient Education  2017 [employer]  

## 2016-03-24 NOTE — Assessment & Plan Note (Signed)
The patient's wounds have healed. He does still have significant swelling. He is now wearing compression stockings daily and this has helped the swelling a little bit. I do believe he has lymphedema from chronic scarring of lymphatic channels. This is likely secondary lymphedema from cellulitis/ulceration in an obese patient with chronic leg swelling. 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 At this point, I believe the lymphedema pump be an excellent adjuvant therapy to improve his swelling. This would be in addition to compression stockings, weight loss, exercise, and elevation which he has already been advised of. We will try to obtain the lymphedema pump in the near future.

## 2016-06-05 ENCOUNTER — Telehealth (INDEPENDENT_AMBULATORY_CARE_PROVIDER_SITE_OTHER): Payer: Self-pay | Admitting: Vascular Surgery

## 2016-06-05 NOTE — Telephone Encounter (Signed)
Patient left a voicemail stating that his short term disability is being terminated. He said that the company has been requesting updated information from [place] and they haven't received anything.

## 2016-06-05 NOTE — Telephone Encounter (Signed)
Left message for patient to call back to the office;  

## 2016-06-10 NOTE — Telephone Encounter (Signed)
I spoke with the patient and inform him to call the company and have them to fax the paper work to the office.

## 2016-06-25 NOTE — Telephone Encounter (Signed)
Patient states his employer has not received the proper paperwork and they faxed the information on 5/30. He states their fax number [phone]. Please advise.

## 2016-06-26 NOTE — Telephone Encounter (Signed)
The fax was receive and the paper work is on the doctor desk to filled out

## 2016-07-07 ENCOUNTER — Ambulatory Visit (INDEPENDENT_AMBULATORY_CARE_PROVIDER_SITE_OTHER): Payer: Medicaid Other | Admitting: Vascular Surgery

## 2016-07-14 ENCOUNTER — Ambulatory Visit (INDEPENDENT_AMBULATORY_CARE_PROVIDER_SITE_OTHER): Payer: Medicaid Other | Admitting: Vascular Surgery

## 2016-07-17 ENCOUNTER — Encounter (INDEPENDENT_AMBULATORY_CARE_PROVIDER_SITE_OTHER): Payer: Self-pay | Admitting: Vascular Surgery

## 2016-07-17 ENCOUNTER — Encounter (INDEPENDENT_AMBULATORY_CARE_PROVIDER_SITE_OTHER): Payer: Self-pay

## 2016-07-17 ENCOUNTER — Ambulatory Visit (INDEPENDENT_AMBULATORY_CARE_PROVIDER_SITE_OTHER): Payer: Medicaid Other | Admitting: Vascular Surgery

## 2016-07-17 VITALS — BP 175/92 | HR 94 | Resp 16 | Wt 326.0 lb

## 2016-07-17 DIAGNOSIS — I890 Lymphedema, not elsewhere classified: Secondary | ICD-10-CM | POA: Diagnosis not present

## 2016-07-17 NOTE — Assessment & Plan Note (Signed)
His swelling is reasonably well controlled but not dramatically better than his last visit. He is using the lymphedema pump regularly wearing his stockings. He has lost 10 pounds and overall his legs are slightly better although not dramatically so. We discussed this is a maintenance disease at this point he will need to continue all of his conservative measures to keep his leg swelling down. I plan to see him back in about 6 months.

## 2016-07-17 NOTE — Patient Instructions (Signed)

## 2016-07-17 NOTE — Progress Notes (Signed)
MRN : [id number]  [name] is a 51 y.o. ([birth year]) male who presents with chief complaint of  Chief Complaint  Patient presents with  . Follow-up  .  History of Present Illness: Patient returns today in follow up of Lymphedema and leg swelling. He has been regularly using his compression pump, compression stockings, and has lost 10 pounds. He is eating better and is trying to fulfill all the conservative therapy requirements of severe chronic lymphedema. The pump has helped some but not dramatically so. He still has discomfort and swelling in his legs although it is stable to slightly improved. No new ulceration or infection.  Current Outpatient Prescriptions  Medication Sig Dispense Refill  . aspirin 325 MG EC tablet Take 325 mg by mouth daily.    Marland Kitchen aspirin-acetaminophen-caffeine (EXCEDRIN MIGRAINE) 250-250-65 MG tablet Take by mouth every 6 (six) hours as needed for headache.    Marland Kitchen atorvastatin (LIPITOR) 20 MG tablet Take 20 mg by mouth daily.    Marland Kitchen buPROPion (WELLBUTRIN SR) 150 MG 12 hr tablet Take 150 mg by mouth 2 (two) times daily.    . Fluticasone-Salmeterol (ADVAIR DISKUS) 250-50 MCG/DOSE AEPB Inhale 1 puff into the lungs 2 (two) times daily.    . furosemide (LASIX) 40 MG tablet Take 1 tablet (40 mg total) by mouth daily. 30 tablet 0  . hydrochlorothiazide (MICROZIDE) 12.5 MG capsule TK 1 C PO D FOR HIGH BP  0  . loratadine (CLARITIN) 10 MG tablet Take 10 mg by mouth daily.    Marland Kitchen losartan (COZAAR) 50 MG tablet Take 1 tablet (50 mg total) by mouth daily. 30 tablet 0  . metFORMIN (GLUCOPHAGE) 1000 MG tablet Take 1,000 mg by mouth 2 (two) times daily with a meal.    . omeprazole (PRILOSEC) 20 MG capsule Take 20 mg by mouth 2 (two) times daily.    Marland Kitchen oxybutynin (DITROPAN-XL) 10 MG 24 hr tablet Take 10 mg by mouth at bedtime.    . cephALEXin (KEFLEX) 500 MG capsule Take 1 capsule (500 mg total) by mouth 4 (four) times daily. (Patient not taking: Reported on 07/17/2016) 40 capsule 0   . nicotine (NICODERM CQ - DOSED IN MG/24 HOURS) 21 mg/24hr patch Place 1 patch (21 mg total) onto the skin daily. (Patient not taking: Reported on 07/17/2016) 28 patch 0  . oxyCODONE-acetaminophen (PERCOCET/ROXICET) 5-325 MG tablet Take 1 tablet by mouth every 6 (six) hours as needed for moderate pain. (Patient not taking: Reported on 03/24/2016) 15 tablet 0  . umeclidinium bromide (INCRUSE ELLIPTA) 62.5 MCG/INH AEPB Inhale 1 puff into the lungs daily.     No current facility-administered medications for this visit.     Past Medical History:  Diagnosis Date  . COPD (chronic obstructive pulmonary disease) ([place])   . Coronary artery disease   . Diabetes mellitus without complication ([place])   . Hypertension   . Peptic ulcer   . Sleep apnea     History reviewed. No pertinent surgical history.  Family History  Problem Relation Age of Onset  . Diabetes Mother   . Diabetes Father   . Stomach cancer Father   No bleeding or clotting disorders  Social History      Social History  Substance Use Topics  . Smoking status: Current Every Day Smoker    Packs/day: 1.00    Years: 30.00    Types: Cigarettes  . Smokeless tobacco: Never Used  . Alcohol use No  No IVDU  Allergies  Allergen Reactions  . Shellfish Allergy Swelling  . Codeine Rash  . Lisinopril Cough    No current facility-administered medications for this visit.       REVIEW OF SYSTEMS(Negative unless checked)  Constitutional: [] Weight loss[] Fever[] Chills Cardiac:[] Chest pain[] Chest pressure[] Palpitations [] Shortness of breath when laying flat [] Shortness of breath at rest [x] Shortness of breath with exertion. Vascular: [] Pain in legs with walking[x] Pain in legsat rest[] Pain in legs when laying flat [] Claudication [] Pain in feet when walking [] Pain in feet at rest [] Pain in feet when laying flat [] History of DVT [] Phlebitis [x] Swelling in legs [] Varicose  veins [x] Non-healing ulcers Pulmonary: [] Uses home oxygen [] Productive cough[] Hemoptysis [] Wheeze [x] COPD [] Asthma Neurologic: [] Dizziness [] Blackouts [] Seizures [] History of stroke [] History of TIA[] Aphasia [] Temporary blindness[] Dysphagia [] Weaknessor numbness in arms [] Weakness or numbnessin legs Musculoskeletal: [] Arthritis [] Joint swelling [x] Joint pain [] Low back pain Hematologic:[] Easy bruising[] Easy bleeding [] Hypercoagulable state [] Anemic [] Hepatitis Gastrointestinal:[] Blood in stool[] Vomiting blood[] Gastroesophageal reflux/heartburn[] Abdominal pain Genitourinary: [] Chronic kidney disease [] Difficulturination [] Frequenturination [] Burning with urination[] Hematuria Skin: [] Rashes [x] Ulcers [x] Wounds Psychological: [] History of anxiety[] History of major depression.     Physical Examination  BP (!) 175/92   Pulse 94   Resp 16   Wt (!) 326 lb (147.9 kg)   BMI 48.14 kg/m  Gen:  WD/WN, NAD. Obese Head: [place]/AT, No temporalis wasting. Ear/Nose/Throat: Hearing grossly intact, nares w/o erythema or drainage, trachea midline Eyes: Conjunctiva clear. Sclera non-icteric Neck: Supple.  No JVD.  Pulmonary:  Good air movement, no use of accessory muscles.  Cardiac: RRR, normal S1, S2 Vascular:  Vessel Right Left  Radial Palpable Palpable  Ulnar Palpable Palpable  Brachial Palpable Palpable  Carotid Palpable, without bruit Palpable, without bruit  Aorta Not palpable N/A  Femoral Palpable Palpable  Popliteal Palpable Palpable  PT 1+ Palpable Not Palpable  DP Palpable Palpable    Musculoskeletal: M/S 5/5 throughout.  No deformity or atrophy. 1+ right lower extremity edema, 2+ left lower extremity edema. Neurologic: Sensation grossly intact in extremities.  Symmetrical.  Speech is fluent.  Psychiatric: Judgment intact, Mood & affect appropriate for pt's clinical situation. Dermatologic: No rashes or  ulcers noted.  No cellulitis or open wounds.       Labs No results found for this or any previous visit (from the past 2160 hour(s)).  Radiology No results found.    Assessment/Plan Obesity Discussed that weight loss would be very beneficial for wound healing and leg swelling.  Essential hypertension, benign blood pressure control important in reducing the progression of atherosclerotic disease. On appropriate oral medications.   Hyperlipidemia lipid control important in reducing the progression of atherosclerotic disease. Continue statin therapy  Lymphedema His swelling is reasonably well controlled but not dramatically better than his last visit. He is using the lymphedema pump regularly wearing his stockings. He has lost 10 pounds and overall his legs are slightly better although not dramatically so. We discussed this is a maintenance disease at this point he will need to continue all of his conservative measures to keep his leg swelling down. I plan to see him back in about 6 months.    Leotis Pain, MD  07/17/2016 4:55 PM    This note was created with Dragon medical transcription system.  Any errors from dictation are purely unintentional

## 2016-09-22 ENCOUNTER — Ambulatory Visit (INDEPENDENT_AMBULATORY_CARE_PROVIDER_SITE_OTHER): Payer: Medicaid Other | Admitting: Vascular Surgery

## 2016-09-22 ENCOUNTER — Encounter (INDEPENDENT_AMBULATORY_CARE_PROVIDER_SITE_OTHER): Payer: Medicaid Other

## 2016-10-14 DIAGNOSIS — Z1211 Encounter for screening for malignant neoplasm of colon: Secondary | ICD-10-CM | POA: Insufficient documentation

## 2016-11-09 ENCOUNTER — Ambulatory Visit: Payer: Medicaid Other | Admitting: Certified Registered Nurse Anesthetist

## 2016-11-09 ENCOUNTER — Encounter
Admission: RE | Disposition: A | Discharge status: Home / Self Care | Payer: Self-pay | Source: Ambulatory Visit | Attending: Gastroenterology

## 2016-11-09 ENCOUNTER — Ambulatory Visit
Admission: RE | Admit: 2016-11-09 | Discharge: 2016-11-09 | Disposition: A | Discharge status: Home / Self Care | Payer: Medicaid Other | Source: Ambulatory Visit | Attending: Gastroenterology | Admitting: Gastroenterology

## 2016-11-09 DIAGNOSIS — I2510 Atherosclerotic heart disease of native coronary artery without angina pectoris: Secondary | ICD-10-CM | POA: Diagnosis not present

## 2016-11-09 DIAGNOSIS — K259 Gastric ulcer, unspecified as acute or chronic, without hemorrhage or perforation: Secondary | ICD-10-CM | POA: Diagnosis not present

## 2016-11-09 DIAGNOSIS — G4730 Sleep apnea, unspecified: Secondary | ICD-10-CM | POA: Diagnosis not present

## 2016-11-09 DIAGNOSIS — Z1211 Encounter for screening for malignant neoplasm of colon: Secondary | ICD-10-CM | POA: Diagnosis not present

## 2016-11-09 DIAGNOSIS — J449 Chronic obstructive pulmonary disease, unspecified: Secondary | ICD-10-CM | POA: Diagnosis not present

## 2016-11-09 DIAGNOSIS — Z1389 Encounter for screening for other disorder: Secondary | ICD-10-CM | POA: Diagnosis not present

## 2016-11-09 DIAGNOSIS — E119 Type 2 diabetes mellitus without complications: Secondary | ICD-10-CM | POA: Insufficient documentation

## 2016-11-09 DIAGNOSIS — K2270 Barrett's esophagus without dysplasia: Secondary | ICD-10-CM | POA: Diagnosis not present

## 2016-11-09 DIAGNOSIS — K449 Diaphragmatic hernia without obstruction or gangrene: Secondary | ICD-10-CM | POA: Diagnosis not present

## 2016-11-09 DIAGNOSIS — Z79899 Other long term (current) drug therapy: Secondary | ICD-10-CM | POA: Diagnosis not present

## 2016-11-09 DIAGNOSIS — I10 Essential (primary) hypertension: Secondary | ICD-10-CM | POA: Insufficient documentation

## 2016-11-09 DIAGNOSIS — F17200 Nicotine dependence, unspecified, uncomplicated: Secondary | ICD-10-CM | POA: Diagnosis not present

## 2016-11-09 DIAGNOSIS — Z7984 Long term (current) use of oral hypoglycemic drugs: Secondary | ICD-10-CM | POA: Insufficient documentation

## 2016-11-09 DIAGNOSIS — Z7982 Long term (current) use of aspirin: Secondary | ICD-10-CM | POA: Insufficient documentation

## 2016-11-09 DIAGNOSIS — K2950 Unspecified chronic gastritis without bleeding: Secondary | ICD-10-CM | POA: Insufficient documentation

## 2016-11-09 HISTORY — PX: COLONOSCOPY WITH PROPOFOL: SHX5780

## 2016-11-09 HISTORY — PX: ESOPHAGOGASTRODUODENOSCOPY (EGD) WITH PROPOFOL: SHX5813

## 2016-11-09 LAB — GLUCOSE, CAPILLARY: Glucose-Capillary: 122 mg/dL — ABNORMAL HIGH (ref 65–99)

## 2016-11-09 SURGERY — ESOPHAGOGASTRODUODENOSCOPY (EGD) WITH PROPOFOL
Anesthesia: General

## 2016-11-09 MED ORDER — SODIUM CHLORIDE 0.9 % IV SOLN
INTRAVENOUS | Status: DC
  Administered 2016-11-09: 14:00:00 via INTRAVENOUS

## 2016-11-09 MED ORDER — LIDOCAINE HCL 2 % EX GEL
CUTANEOUS | Status: AC
  Filled 2016-11-09: qty 5

## 2016-11-09 MED ORDER — PROPOFOL 10 MG/ML IV BOLUS
INTRAVENOUS | Status: DC | PRN
  Administered 2016-11-09: 70 mg via INTRAVENOUS
  Administered 2016-11-09: 30 mg via INTRAVENOUS
  Administered 2016-11-09: 50 mg via INTRAVENOUS
  Administered 2016-11-09 (×2): 30 mg via INTRAVENOUS

## 2016-11-09 MED ORDER — SODIUM CHLORIDE 0.9 % IV SOLN
INTRAVENOUS | Status: DC
  Administered 2016-11-09: 13:00:00 via INTRAVENOUS

## 2016-11-09 MED ORDER — PROPOFOL 500 MG/50ML IV EMUL
INTRAVENOUS | Status: DC | PRN
  Administered 2016-11-09: 100 ug/kg/min via INTRAVENOUS

## 2016-11-09 MED ORDER — PROPOFOL 500 MG/50ML IV EMUL
INTRAVENOUS | Status: AC
  Filled 2016-11-09: qty 50

## 2016-11-09 MED ORDER — PROPOFOL 10 MG/ML IV BOLUS
INTRAVENOUS | Status: AC
  Filled 2016-11-09: qty 40

## 2016-11-09 NOTE — H&P (Signed)
Outpatient short stay form Pre-procedure 11/09/2016 1:56 PM Lollie Sails MD  Primary Physician: Dr. Elyse Jarvis Revelo  Reason for visit:  EGD and colonoscopy  History of present illness:  Patient is a 51 year old male presenting today for EGD and colonoscopy. He has a history of possibly Barretts esophagus. He was unsure he also has however history of gastric ulcer. He has been having some left-sided abdominal pain. He has never had a colonoscopy and we are also going to do a colon screening today. Tolerated his prep well. He does take aspirin 325 mg, but none in the past week. He takes no blood thinning agent otherwise.    Current Facility-Administered Medications:  .  0.9 %  sodium chloride infusion, , Intravenous, Continuous, Lollie Sails, MD, Last Rate: 20 mL/hr at 11/09/16 1305 .  0.9 %  sodium chloride infusion, , Intravenous, Continuous, Lollie Sails, MD  Prescriptions Prior to Admission  Medication Sig Dispense Refill Last Dose  . atorvastatin (LIPITOR) 20 MG tablet Take 20 mg by mouth daily.   11/08/2016 at Unknown time  . buPROPion (WELLBUTRIN SR) 150 MG 12 hr tablet Take 150 mg by mouth 2 (two) times daily.   11/08/2016 at Unknown time  . Fluticasone-Salmeterol (ADVAIR DISKUS) 250-50 MCG/DOSE AEPB Inhale 1 puff into the lungs 2 (two) times daily.   11/09/2016 at Unknown time  . furosemide (LASIX) 40 MG tablet Take 1 tablet (40 mg total) by mouth daily. 30 tablet 0 11/08/2016 at Unknown time  . hydrochlorothiazide (MICROZIDE) 12.5 MG capsule TK 1 C PO D FOR HIGH BP  0 11/08/2016 at Unknown time  . loratadine (CLARITIN) 10 MG tablet Take 10 mg by mouth daily.   11/08/2016 at Unknown time  . losartan (COZAAR) 50 MG tablet Take 1 tablet (50 mg total) by mouth daily. 30 tablet 0 11/08/2016 at Unknown time  . omeprazole (PRILOSEC) 20 MG capsule Take 20 mg by mouth 2 (two) times daily.   11/08/2016 at Unknown time  . oxybutynin (DITROPAN-XL) 10 MG 24 hr tablet Take  10 mg by mouth at bedtime.   11/08/2016 at Unknown time  . umeclidinium bromide (INCRUSE ELLIPTA) 62.5 MCG/INH AEPB Inhale 1 puff into the lungs daily.   11/09/2016 at Unknown time  . aspirin 325 MG EC tablet Take 325 mg by mouth daily.   11/07/2016  . aspirin-acetaminophen-caffeine (EXCEDRIN MIGRAINE) 250-250-65 MG tablet Take by mouth every 6 (six) hours as needed for headache.   11/05/2016  . cephALEXin (KEFLEX) 500 MG capsule Take 1 capsule (500 mg total) by mouth 4 (four) times daily. (Patient not taking: Reported on 07/17/2016) 40 capsule 0 Not Taking  . metFORMIN (GLUCOPHAGE) 1000 MG tablet Take 1,000 mg by mouth 2 (two) times daily with a meal.   11/07/2016  . nicotine (NICODERM CQ - DOSED IN MG/24 HOURS) 21 mg/24hr patch Place 1 patch (21 mg total) onto the skin daily. (Patient not taking: Reported on 07/17/2016) 28 patch 0 Not Taking  . oxyCODONE-acetaminophen (PERCOCET/ROXICET) 5-325 MG tablet Take 1 tablet by mouth every 6 (six) hours as needed for moderate pain. (Patient not taking: Reported on 03/24/2016) 15 tablet 0 Not Taking     Allergies  Allergen Reactions  . Shellfish Allergy Swelling  . Codeine Rash  . Lisinopril Cough     Past Medical History:  Diagnosis Date  . COPD (chronic obstructive pulmonary disease) ([place])   . Coronary artery disease   . Diabetes mellitus without complication ([place])   .  Hypertension   . Peptic ulcer   . Sleep apnea     Review of systems:      Physical Exam    Heart and lungs: Regular rate and rhythm without rub or gallop, lungs are bilaterally clear.    HEENT: Normocephalic atraumatic eyes are anicteric    Other:     Pertinant exam for procedure: Protuberant/soft, mild discomfort palpation left lower quadrant. Bowel sounds positive normoactive. There are no masses or rebound.    Planned proceedures: EGD, colonoscopy and indicated procedures. I have discussed the risks benefits and complications of procedures to include not limited to  bleeding, infection, perforation and the risk of sedation and the patient wishes to proceed.    Lollie Sails, MD Gastroenterology 11/09/2016  1:56 PM

## 2016-11-09 NOTE — Transfer of Care (Signed)
Immediate Anesthesia Transfer of Care Note  Patient: [name]  Procedure(s) Performed: ESOPHAGOGASTRODUODENOSCOPY (EGD) WITH PROPOFOL (N/A ) COLONOSCOPY WITH PROPOFOL (N/A )  Patient Location: PACU and Endoscopy Unit  Anesthesia Type:General  Level of Consciousness: sedated and responds to stimulation  Airway & Oxygen Therapy: Patient Spontanous Breathing and Patient connected to nasal cannula oxygen  Post-op Assessment: Report given to RN and Post -op Vital signs reviewed and stable  Post vital signs: Reviewed and stable  Last Vitals:  Vitals:   11/09/16 1240 11/09/16 1449  BP: (!) 179/84 121/71  Pulse: 98 98  Resp: 18 16  Temp: 37.1 C 36.7 C  SpO2: 95% 96%    Last Pain:  Vitals:   11/09/16 1449  TempSrc: Tympanic  PainSc: Asleep         Complications: No apparent anesthesia complications

## 2016-11-09 NOTE — OR Nursing (Signed)
Poor Prep procedure aborted.

## 2016-11-09 NOTE — Op Note (Signed)
[facility] Gastroenterology Patient Name: [name] Procedure Date: 11/09/2016 2:03 PM MRN: [id number] Account #: [id number] Date of Birth: [birth year] Admit Type: Outpatient Age: 51 Room: [facility] ENDO ROOM 3 Gender: Male Note Status: Finalized Procedure:            Upper GI endoscopy Indications:          Follow-up of Barrett's esophagus, Follow-up of gastric                        ulcer Providers:            Lollie Sails, MD Referring MD:         Dyke Maes. Mancheno Revelo (Referring MD) Medicines:            Monitored Anesthesia Care Complications:        No immediate complications. Procedure:            Pre-Anesthesia Assessment:                       - ASA Grade Assessment: III - A patient with severe                        systemic disease.                       After obtaining informed consent, the endoscope was                        passed under direct vision. Throughout the procedure,                        the patient's blood pressure, pulse, and oxygen                        saturations were monitored continuously. The Endoscope                        was introduced through the mouth, and advanced to the                        third part of duodenum. The upper GI endoscopy was                        unusually difficult due to the patient's body habitus                        and the patient's position intolerance in the setting                        of OSA. The patient tolerated the procedure. Findings:      There were esophageal mucosal changes consistent with long-segment       Barrett's esophagus present in the distal esophagus. The maximum       longitudinal extent of these mucosal changes was 5 cm in length. Mucosa       was biopsied with a cold forceps for histology randomly at intervals of       2 cm at 36, 38 and 40 cm from the incisors. A total of 3 specimen       bottles were sent to pathology.  Three non-bleeding cratered  gastric ulcers with no stigmata of bleeding       were found on the greater curvature of the gastric antrum and on the       posterior wall of the gastric antrum. The largest lesion was 3 mm in       largest dimension. Biopsies were taken with a cold forceps for histology.      The cardia and gastric fundus were normal on retroflexion.      A small hiatal hernia was found. The Z-line was a variable distance from       incisors; the hiatal hernia was sliding.      The examined duodenum was normal. Impression:           - Esophageal mucosal changes consistent with                        long-segment Barrett's esophagus. Biopsied.                       - Non-bleeding gastric ulcers with no stigmata of                        bleeding. Biopsied.                       - Small hiatal hernia.                       - Normal examined duodenum. Recommendation:       - Await pathology results.                       - Use Protonix (pantoprazole) 40 mg PO BID for 2 months.                       - Repeat upper endoscopy in 8 weeks to check healing. Procedure Code(s):    --- Professional ---                       [phone], Esophagogastroduodenoscopy, flexible, transoral;                        with biopsy, single or multiple CPT copyright 2016 American Medical Association. All rights reserved. The codes documented in this report are preliminary and upon coder review may  be revised to meet current compliance requirements. Lollie Sails, MD 11/09/2016 2:36:50 PM This report has been signed electronically. Number of Addenda: 0 Note Initiated On: 11/09/2016 2:03 PM      [facility]

## 2016-11-09 NOTE — Anesthesia Preprocedure Evaluation (Signed)
Anesthesia Evaluation  Patient identified by MRN, date of birth, ID band Patient awake    Reviewed: Allergy & Precautions, NPO status , Patient's Chart, lab work & pertinent test results  Airway Mallampati: III  TM Distance: >3 FB     Dental  (+) Upper Dentures, Partial Lower   Pulmonary sleep apnea , COPD,  COPD inhaler, Current Smoker,     + decreased breath sounds      Cardiovascular Exercise Tolerance: Poor hypertension, Pt. on medications + CAD   Rhythm:Regular     Neuro/Psych negative neurological ROS     GI/Hepatic Neg liver ROS, PUD,   Endo/Other  diabetes, Type 2, Oral Hypoglycemic Agents  Renal/GU      Musculoskeletal   Abdominal (+) + obese,   Peds negative pediatric ROS (+)  Hematology negative hematology ROS (+)   Anesthesia Other Findings   Reproductive/Obstetrics                             Anesthesia Physical Anesthesia Plan  ASA: III  Anesthesia Plan: General   Post-op Pain Management:    Induction: Intravenous  PONV Risk Score and Plan: 0  Airway Management Planned: Natural Airway and Nasal Cannula  Additional Equipment:   Intra-op Plan:   Post-operative Plan:   Informed Consent: I have reviewed the patients History and Physical, chart, labs and discussed the procedure including the risks, benefits and alternatives for the proposed anesthesia with the patient or authorized representative who has indicated his/her understanding and acceptance.     Plan Discussed with: CRNA  Anesthesia Plan Comments:         Anesthesia Quick Evaluation

## 2016-11-09 NOTE — Anesthesia Post-op Follow-up Note (Signed)
Anesthesia QCDR form completed.        

## 2016-11-09 NOTE — Op Note (Signed)
[facility] Gastroenterology Patient Name: [name] Procedure Date: 11/09/2016 2:03 PM MRN: [id number] Account #: [id number] Date of Birth: 08-15-1965 Admit Type: Outpatient Age: 51 Room: [facility] ENDO ROOM 3 Gender: Male Note Status: Finalized Procedure:            Colonoscopy Indications:          Screening for colorectal malignant neoplasm Providers:            Lollie Sails, MD Referring MD:         Dyke Maes. Mancheno Revelo (Referring MD) Medicines:            Monitored Anesthesia Care Complications:        No immediate complications. Procedure:            Pre-Anesthesia Assessment:                       - ASA Grade Assessment: III - A patient with severe                        systemic disease.                       After obtaining informed consent, the colonoscope was                        passed under direct vision. Throughout the procedure,                        the patient's blood pressure, pulse, and oxygen                        saturations were monitored continuously. The                        Colonoscope was introduced through the anus with the                        intention of advancing to the cecum. The scope was                        advanced to the descending colon before the procedure                        was aborted. Medications were given. The quality of the                        bowel preparation was poor. Findings:      A large amount of semi-liquid stool was found in the sigmoid colon and       in the descending colon, precluding visualization.      The digital rectal exam was normal. Impression:           - Preparation of the colon was poor.                       - Stool in the sigmoid colon and in the descending                        colon.                       -  No specimens collected. Recommendation:       - Discharge patient to home.                       - reprep and reschedule. Procedure Code(s):    ---  Professional ---                       [phone], 68, Colonoscopy, flexible; diagnostic, including                        collection of specimen(s) by brushing or washing, when                        performed (separate procedure) Diagnosis Code(s):    --- Professional ---                       Z12.11, Encounter for screening for malignant neoplasm                        of colon CPT copyright 2016 American Medical Association. All rights reserved. The codes documented in this report are preliminary and upon coder review may  be revised to meet current compliance requirements. Lollie Sails, MD 11/09/2016 2:48:38 PM This report has been signed electronically. Number of Addenda: 0 Note Initiated On: 11/09/2016 2:03 PM      [facility]

## 2016-11-09 NOTE — Anesthesia Postprocedure Evaluation (Signed)
Anesthesia Post Note  Patient: [name]  Procedure(s) Performed: ESOPHAGOGASTRODUODENOSCOPY (EGD) WITH PROPOFOL (N/A ) COLONOSCOPY WITH PROPOFOL (N/A )  Patient location during evaluation: PACU Anesthesia Type: General Level of consciousness: awake Pain management: pain level controlled Vital Signs Assessment: post-procedure vital signs reviewed and stable Respiratory status: spontaneous breathing Cardiovascular status: stable Anesthetic complications: no     Last Vitals:  Vitals:   11/09/16 1240 11/09/16 1449  BP: (!) 179/84 121/71  Pulse: 98 98  Resp: 18 16  Temp: 37.1 C 36.7 C  SpO2: 95% 96%    Last Pain:  Vitals:   11/09/16 1449  TempSrc: Tympanic  PainSc: Asleep                 VAN STAVEREN,Theadore Blunck

## 2016-11-11 ENCOUNTER — Encounter: Payer: Self-pay | Admitting: Gastroenterology

## 2016-11-11 LAB — SURGICAL PATHOLOGY

## 2016-12-18 ENCOUNTER — Ambulatory Visit (INDEPENDENT_AMBULATORY_CARE_PROVIDER_SITE_OTHER): Payer: Medicaid Other | Admitting: Vascular Surgery

## 2016-12-18 ENCOUNTER — Encounter (INDEPENDENT_AMBULATORY_CARE_PROVIDER_SITE_OTHER): Payer: Self-pay | Admitting: Vascular Surgery

## 2016-12-18 VITALS — BP 159/95 | HR 107 | Resp 18 | Ht 70.0 in | Wt 316.0 lb

## 2016-12-18 DIAGNOSIS — L03116 Cellulitis of left lower limb: Secondary | ICD-10-CM

## 2016-12-18 DIAGNOSIS — I890 Lymphedema, not elsewhere classified: Secondary | ICD-10-CM | POA: Diagnosis not present

## 2016-12-18 DIAGNOSIS — L97322 Non-pressure chronic ulcer of left ankle with fat layer exposed: Secondary | ICD-10-CM | POA: Diagnosis not present

## 2016-12-18 MED ORDER — CEPHALEXIN 500 MG PO CAPS: 500.0000 mg | ORAL_CAPSULE | Freq: Four times a day (QID) | ORAL | 0 refills | Status: DC

## 2016-12-18 NOTE — Progress Notes (Signed)
Subjective:    Patient ID: [name], male    DOB: Jun 07, 1965, 51 y.o.   MRN: [id number] Chief Complaint  Patient presents with  . Follow-up    5-6 month f/u   Patient presents with a chief complaint of a left calf ulceration. The patient noticed the ulcer approximately 3 days ago.  The patient has a known past medical history of lymphedema and does engage in conservative therapy including wearing medical grade 1 compression stockings, elevating his legs and using his lymphedema pump on a daily basis.  The patient denies any trauma or recent surgery to the left lower extremity.  The patient does note the ulcer is very "sore".  There is no drainage from the ulceration.  The patient also notes redness to the left lower calf.  The patient's most recent ABI from January 2018 with triphasic tibials and no evidence of lower extremity arterial disease.  Venous duplex without any venous incompetence.  Patient denies any fever, nausea vomiting.   Review of Systems  Constitutional: Negative.   HENT: Negative.   Eyes: Negative.   Respiratory: Negative.   Cardiovascular: Positive for leg swelling.  Gastrointestinal: Negative.   Endocrine: Negative.   Genitourinary: Negative.   Musculoskeletal: Negative.   Skin: Positive for wound.  Allergic/Immunologic: Negative.   Neurological: Negative.   Hematological: Negative.   Psychiatric/Behavioral: Negative.       Objective:   Physical Exam  Constitutional: He is oriented to person, place, and time. He appears well-developed and well-nourished. No distress.  HENT:  Head: Normocephalic and atraumatic.  Eyes: Conjunctivae are normal. Pupils are equal, round, and reactive to light.  Neck: Normal range of motion.  Cardiovascular: Normal rate, regular rhythm, normal heart sounds and intact distal pulses.  Pulses:      Radial pulses are 2+ on the right side, and 2+ on the left side.  Hard to palpate pedal pulses due to body habitus and edema  however his bilateral feet are warm  Pulmonary/Chest: Effort normal and breath sounds normal.  Musculoskeletal: Normal range of motion. He exhibits edema (Right lower extremity: Mild to moderate nonpitting edema.  Left lower extremity: Moderate nonpitting edema noted).  Neurological: He is alert and oriented to person, place, and time.  Skin: He is not diaphoretic.  Left calf: There is a 2 cm x 2 cm shallow noninfected ulceration noted to the back of the calf.  There is no drainage.  There is mild cellulitis noted to the left calf.  Psychiatric: He has a normal mood and affect. His behavior is normal. Judgment and thought content normal.  Vitals reviewed.  BP (!) 159/95 (BP Location: Right Arm, Patient Position: Sitting)   Pulse (!) 107   Resp 18   Ht 5\' 10"  (1.778 m)   Wt (!) 316 lb (143.3 kg)   BMI 45.34 kg/m   Past Medical History:  Diagnosis Date  . COPD (chronic obstructive pulmonary disease) ([place])   . Coronary artery disease   . Diabetes mellitus without complication ([place])   . Hypertension   . Peptic ulcer   . Sleep apnea    Social History   Socioeconomic History  . Marital status: Single    Spouse name: Not on file  . Number of children: Not on file  . Years of education: Not on file  . Highest education level: Not on file  Social Needs  . Financial resource strain: Not on file  . Food insecurity - worry: Not on file  .  Food insecurity - inability: Not on file  . Transportation needs - medical: Not on file  . Transportation needs - non-medical: Not on file  Occupational History  . Not on file  Tobacco Use  . Smoking status: Current Every Day Smoker    Packs/day: 1.00    Years: 30.00    Pack years: 30.00    Types: Cigarettes  . Smokeless tobacco: Never Used  Substance and Sexual Activity  . Alcohol use: No  . Drug use: No  . Sexual activity: No  Other Topics Concern  . Not on file  Social History Narrative  . Not on file   Past Surgical History:    Procedure Laterality Date  . COLONOSCOPY WITH PROPOFOL N/A 11/09/2016   Procedure: COLONOSCOPY WITH PROPOFOL;  Surgeon: Lollie Sails, MD;  Location[facility] ENDOSCOPY;  Service: Endoscopy;  Laterality: N/A;  . ESOPHAGOGASTRODUODENOSCOPY (EGD) WITH PROPOFOL N/A 11/09/2016   Procedure: ESOPHAGOGASTRODUODENOSCOPY (EGD) WITH PROPOFOL;  Surgeon: Lollie Sails, MD;  Location: [facility] ENDOSCOPY;  Service: Endoscopy;  Laterality: N/A;   Family History  Problem Relation Age of Onset  . Diabetes Mother   . Diabetes Father   . Stomach cancer Father    Allergies  Allergen Reactions  . Shellfish Allergy Swelling  . Codeine Rash  . Lisinopril Cough      Assessment & Plan:  Patient presents with a chief complaint of a left calf ulceration. The patient noticed the ulcer approximately 3 days ago.  The patient has a known past medical history of lymphedema and does engage in conservative therapy including wearing medical grade 1 compression stockings, elevating his legs and using his lymphedema pump on a daily basis.  The patient denies any trauma or recent surgery to the left lower extremity.  The patient does note the ulcer is very "sore".  There is no drainage from the ulceration.  The patient also notes redness to the left lower calf.  The patient's most recent ABI from January 2018 with triphasic tibials and no evidence of lower extremity arterial disease.  Venous duplex without any venous incompetence.  Patient denies any fever, nausea vomiting.  1. Lymphedema - Stable Left lower extremity lymphedema exacerbation with ulcer formation. Mild cellulitis to the left lower extremity as well. Recommend left lower extremity 3 layer zinc oxide Unna wrap to treat edema and help the ulceration heel Keflex 500 mg 1 tab every 6 hours for 10 days #40 prescribed as well for mild cellulitis. Patient to follow up in 1 week to assess cellulitis and ulceration and undergo weekly Unna wrap change If the patient  experiences any fever, nausea vomiting or worsening erythema to the left extremity he must seek medical attention over the weekend. Patient to continue wearing medical grade 1 compression stockings to the right lower extremity. Patient to continue using his lymphedema pump at least once a day for an hour I encouraged him to use it at least twice a day. Expresses understanding  2. Cellulitis of left lower extremity - New As above  3. Lower limb ulcer, ankle, left, with fat layer exposed ([place]) - New As above  Current Outpatient Medications on File Prior to Visit  Medication Sig Dispense Refill  . albuterol (PROVENTIL HFA) 108 (90 Base) MCG/ACT inhaler INHALE 2 PUFFS Q 4 TO 6 H PRF WHEEZING    . aspirin 325 MG EC tablet Take 325 mg by mouth daily.    Marland Kitchen aspirin-acetaminophen-caffeine (EXCEDRIN MIGRAINE) 250-250-65 MG tablet Take by mouth every 6 (  six) hours as needed for headache.    Marland Kitchen atorvastatin (LIPITOR) 20 MG tablet Take 20 mg by mouth daily.    Marland Kitchen buPROPion (WELLBUTRIN SR) 150 MG 12 hr tablet Take 150 mg by mouth 2 (two) times daily.    . Fluticasone-Salmeterol (ADVAIR DISKUS) 250-50 MCG/DOSE AEPB Inhale 1 puff into the lungs 2 (two) times daily.    . furosemide (LASIX) 40 MG tablet Take 1 tablet (40 mg total) by mouth daily. 30 tablet 0  . hydrochlorothiazide (MICROZIDE) 12.5 MG capsule TK 1 C PO D FOR HIGH BP  0  . loratadine (CLARITIN) 10 MG tablet Take 10 mg by mouth daily.    Marland Kitchen losartan (COZAAR) 50 MG tablet Take 1 tablet (50 mg total) by mouth daily. 30 tablet 0  . metFORMIN (GLUCOPHAGE) 1000 MG tablet Take 1,000 mg by mouth 2 (two) times daily with a meal.    . nicotine (NICODERM CQ - DOSED IN MG/24 HOURS) 21 mg/24hr patch Place 1 patch (21 mg total) onto the skin daily. 28 patch 0  . omeprazole (PRILOSEC) 20 MG capsule Take 20 mg by mouth 2 (two) times daily.    Marland Kitchen oxybutynin (DITROPAN-XL) 10 MG 24 hr tablet Take 10 mg by mouth at bedtime.    Marland Kitchen oxyCODONE-acetaminophen  (PERCOCET/ROXICET) 5-325 MG tablet Take 1 tablet by mouth every 6 (six) hours as needed for moderate pain. 15 tablet 0  . pantoprazole (PROTONIX) 40 MG tablet Take by mouth.    . polyethylene glycol-electrolytes (NULYTELY/GOLYTELY) 420 g solution Take by mouth.    . tiotropium (SPIRIVA HANDIHALER) 18 MCG inhalation capsule INL THE CONTENTS OF 1 C VIA HANDIHALER PO D FOR COPD    . umeclidinium bromide (INCRUSE ELLIPTA) 62.5 MCG/INH AEPB Inhale 1 puff into the lungs daily.     No current facility-administered medications on file prior to visit.    There are no Patient Instructions on file for this visit. No Follow-up on file.  Agustina Witzke A Paulette Rockford, PA-C

## 2016-12-25 ENCOUNTER — Ambulatory Visit (INDEPENDENT_AMBULATORY_CARE_PROVIDER_SITE_OTHER): Payer: Medicaid Other | Admitting: Vascular Surgery

## 2016-12-25 ENCOUNTER — Encounter (INDEPENDENT_AMBULATORY_CARE_PROVIDER_SITE_OTHER): Payer: Self-pay

## 2016-12-25 VITALS — BP 158/87 | HR 91 | Resp 17

## 2016-12-25 DIAGNOSIS — I890 Lymphedema, not elsewhere classified: Secondary | ICD-10-CM

## 2016-12-25 NOTE — Progress Notes (Signed)
History of Present Illness  There is no documented history at this time  Assessments & Plan   There are no diagnoses linked to this encounter.    Additional instructions  Subjective:  Patient presents with venous ulcer of the Left lower extremity.    Procedure:  3 layer unna wrap was placed Left lower extremity.   Plan:   Follow up in one week.  

## 2017-01-01 ENCOUNTER — Encounter (INDEPENDENT_AMBULATORY_CARE_PROVIDER_SITE_OTHER): Payer: Self-pay

## 2017-01-01 ENCOUNTER — Ambulatory Visit (INDEPENDENT_AMBULATORY_CARE_PROVIDER_SITE_OTHER): Payer: Medicaid Other | Admitting: Vascular Surgery

## 2017-01-01 VITALS — BP 153/83 | HR 94 | Resp 17 | Wt 323.0 lb

## 2017-01-01 DIAGNOSIS — I890 Lymphedema, not elsewhere classified: Secondary | ICD-10-CM

## 2017-01-01 NOTE — Progress Notes (Signed)
History of Present Illness  There is no documented history at this time  Assessments & Plan   There are no diagnoses linked to this encounter.    Additional instructions  Subjective:  Patient presents with venous ulcer of the Left lower extremity.    Procedure:  3 layer unna wrap was placed Left lower extremity.   Plan:   Follow up in one week.  

## 2017-01-08 ENCOUNTER — Ambulatory Visit (INDEPENDENT_AMBULATORY_CARE_PROVIDER_SITE_OTHER): Payer: Medicaid Other | Admitting: Vascular Surgery

## 2017-01-08 ENCOUNTER — Encounter (INDEPENDENT_AMBULATORY_CARE_PROVIDER_SITE_OTHER): Payer: Self-pay

## 2017-01-08 VITALS — BP 143/89 | HR 92 | Resp 18

## 2017-01-08 DIAGNOSIS — I890 Lymphedema, not elsewhere classified: Secondary | ICD-10-CM

## 2017-01-08 NOTE — Progress Notes (Signed)
History of Present Illness  There is no documented history at this time  Assessments & Plan   There are no diagnoses linked to this encounter.    Additional instructions  Subjective:  Patient presents with venous ulcer of the Left lower extremity.    Procedure:  3 layer unna wrap was placed Left lower extremity.   Plan:   Follow up in one week.  

## 2017-01-15 ENCOUNTER — Ambulatory Visit (INDEPENDENT_AMBULATORY_CARE_PROVIDER_SITE_OTHER): Payer: Medicaid Other | Admitting: Vascular Surgery

## 2017-01-15 ENCOUNTER — Encounter (INDEPENDENT_AMBULATORY_CARE_PROVIDER_SITE_OTHER): Payer: Self-pay | Admitting: Vascular Surgery

## 2017-01-15 VITALS — BP 197/111 | HR 88 | Resp 19 | Ht 70.0 in | Wt 311.0 lb

## 2017-01-15 DIAGNOSIS — E785 Hyperlipidemia, unspecified: Secondary | ICD-10-CM | POA: Diagnosis not present

## 2017-01-15 DIAGNOSIS — I10 Essential (primary) hypertension: Secondary | ICD-10-CM

## 2017-01-15 DIAGNOSIS — Z6841 Body Mass Index (BMI) 40.0 and over, adult: Secondary | ICD-10-CM

## 2017-01-15 DIAGNOSIS — I890 Lymphedema, not elsewhere classified: Secondary | ICD-10-CM

## 2017-01-15 NOTE — Progress Notes (Signed)
MRN : [id number]  JEEVAN KALLA is a 52 y.o. (03/12/1965) male who presents with chief complaint of  Chief Complaint  Patient presents with  . Follow-up    Unna boot check  .  History of Present Illness: Patient returns today in follow up of leg swelling.  He has been in [employer] now for about a month.  His ulceration on the back of his left leg has healed.  His swelling is better but not entirely resolved.  The cellulitis and redness that was in the leg does seem to be improved..  Current Outpatient Prescriptions  Medication Sig Dispense Refill  . aspirin 325 MG EC tablet Take 325 mg by mouth daily.    Marland Kitchen aspirin-acetaminophen-caffeine (EXCEDRIN MIGRAINE) 250-250-65 MG tablet Take by mouth every 6 (six) hours as needed for headache.    Marland Kitchen atorvastatin (LIPITOR) 20 MG tablet Take 20 mg by mouth daily.    Marland Kitchen buPROPion (WELLBUTRIN SR) 150 MG 12 hr tablet Take 150 mg by mouth 2 (two) times daily.    . Fluticasone-Salmeterol (ADVAIR DISKUS) 250-50 MCG/DOSE AEPB Inhale 1 puff into the lungs 2 (two) times daily.    . furosemide (LASIX) 40 MG tablet Take 1 tablet (40 mg total) by mouth daily. 30 tablet 0  . hydrochlorothiazide (MICROZIDE) 12.5 MG capsule TK 1 C PO D FOR HIGH BP  0  . loratadine (CLARITIN) 10 MG tablet Take 10 mg by mouth daily.    Marland Kitchen losartan (COZAAR) 50 MG tablet Take 1 tablet (50 mg total) by mouth daily. 30 tablet 0  . metFORMIN (GLUCOPHAGE) 1000 MG tablet Take 1,000 mg by mouth 2 (two) times daily with a meal.    . omeprazole (PRILOSEC) 20 MG capsule Take 20 mg by mouth 2 (two) times daily.    Marland Kitchen oxybutynin (DITROPAN-XL) 10 MG 24 hr tablet Take 10 mg by mouth at bedtime.    . cephALEXin (KEFLEX) 500 MG capsule Take 1 capsule (500 mg total) by mouth 4 (four) times daily. (Patient not taking: Reported on 07/17/2016) 40 capsule 0  . nicotine (NICODERM CQ - DOSED IN MG/24 HOURS) 21 mg/24hr patch Place 1 patch (21 mg total) onto the skin daily. (Patient not  taking: Reported on 07/17/2016) 28 patch 0  . oxyCODONE-acetaminophen (PERCOCET/ROXICET) 5-325 MG tablet Take 1 tablet by mouth every 6 (six) hours as needed for moderate pain. (Patient not taking: Reported on 03/24/2016) 15 tablet 0  . umeclidinium bromide (INCRUSE ELLIPTA) 62.5 MCG/INH AEPB Inhale 1 puff into the lungs daily.     No current facility-administered medications for this visit.         Past Medical History:  Diagnosis Date  . COPD (chronic obstructive pulmonary disease) ([place])   . Coronary artery disease   . Diabetes mellitus without complication ([place])   . Hypertension   . Peptic ulcer   . Sleep apnea     History reviewed. No pertinent surgical history.       Family History  Problem Relation Age of Onset  . Diabetes Mother   . Diabetes Father   . Stomach cancer Father   No bleeding or clotting disorders  Social History      Social History  Substance Use Topics  . Smoking status: Current Every Day Smoker    Packs/day: 1.00    Years: 30.00    Types: Cigarettes  . Smokeless tobacco: Never Used  . Alcohol use No  No IVDU      Allergies  Allergen Reactions  . Shellfish Allergy Swelling  . Codeine Rash  . Lisinopril Cough    No current facility-administered medications for this visit.       REVIEW OF SYSTEMS(Negative unless checked)  Constitutional: [] Weight loss[] Fever[] Chills Cardiac:[] Chest pain[] Chest pressure[] Palpitations [] Shortness of breath when laying flat [] Shortness of breath at rest [x] Shortness of breath with exertion. Vascular: [] Pain in legs with walking[x] Pain in legsat rest[] Pain in legs when laying flat [] Claudication [] Pain in feet when walking [] Pain in feet at rest [] Pain in feet when laying flat [] History of DVT [] Phlebitis [x] Swelling in legs [] Varicose veins [x] Non-healing ulcers Pulmonary: [] Uses home oxygen [] Productive cough[] Hemoptysis  [] Wheeze [x] COPD [] Asthma Neurologic: [] Dizziness [] Blackouts [] Seizures [] History of stroke [] History of TIA[] Aphasia [] Temporary blindness[] Dysphagia [] Weaknessor numbness in arms [] Weakness or numbnessin legs Musculoskeletal: [] Arthritis [] Joint swelling [x] Joint pain [] Low back pain Hematologic:[] Easy bruising[] Easy bleeding [] Hypercoagulable state [] Anemic [] Hepatitis Gastrointestinal:[] Blood in stool[] Vomiting blood[] Gastroesophageal reflux/heartburn[] Abdominal pain Genitourinary: [] Chronic kidney disease [] Difficulturination [] Frequenturination [] Burning with urination[] Hematuria Skin: [] Rashes [x] Ulcers [x] Wounds Psychological: [] History of anxiety[] History of major depression.       Physical Examination  BP (!) 197/111 (BP Location: Right Arm, Patient Position: Sitting)   Pulse 88   Resp 19   Ht 5\' 10"  (1.778 m)   Wt (!) 141.1 kg (311 lb)   BMI 44.62 kg/m  Gen:  WD/WN, NAD.  Obese. Head: [place]/AT, No temporalis wasting. Ear/Nose/Throat: Hearing grossly intact, nares w/o erythema or drainage, trachea midline Eyes: Conjunctiva clear. Sclera non-icteric Neck: Supple.  No JVD.  Pulmonary:  Good air movement, no use of accessory muscles.  Cardiac: RRR, normal S1, S2 Vascular:  Vessel Right Left  Radial Palpable Palpable                                    Musculoskeletal: M/S 5/5 throughout.  No deformity or atrophy.  1-2+ right lower extremity edema, 2+ left lower extremity edema. Neurologic: Sensation grossly intact in extremities.  Symmetrical.  Speech is fluent.  Psychiatric: Judgment intact, Mood & affect appropriate for pt's clinical situation. Dermatologic: Previous left posterior calf ulceration has healed.      Labs Recent Results (from the past 2160 hour(s))  Glucose, capillary     Status: Abnormal   Collection Time: 11/09/16 12:53 PM  Result Value Ref Range   Glucose-Capillary  122 (H) 65 - 99 mg/dL  Surgical pathology     Status: None   Collection Time: 11/09/16  2:32 PM  Result Value Ref Range   SURGICAL PATHOLOGY      Surgical Pathology CASE: [id number] PATIENT: Conlin Haacke Surgical Pathology Report     SPECIMEN SUBMITTED: A. Stomach, antrum and body; cbx B. Esophagus, 40 cm; cbx C. Esophagus, 38 cm; cbx D. Esophagus, 36 cm; cbx  CLINICAL HISTORY: None provided  PRE-OPERATIVE DIAGNOSIS: Gastric ulcer, colon cancer screening  POST-OPERATIVE DIAGNOSIS: Hiatal hernia, gastric ulcers, Barrett's, gastritis     DIAGNOSIS: A.  STOMACH, ANTRUM AND BODY; COLD BIOPSY: - ANTRAL MUCOSA WITH MINIMAL CHRONIC GASTRITIS AND FEATURES CONSISTENT WITH HEALING EROSION. - OXYNTIC MUCOSA WITH MINIMAL CHRONIC GASTRITIS. - NEGATIVE FOR H. PYLORI, DYSPLASIA AND MALIGNANCY.  B.  ESOPHAGUS, 40 CM; COLD BIOPSY: - GASTRIC TYPE MUCOSA WITH MINIMAL CHRONIC INFLAMMATION. - MINUTE FRAGMENT OF SQUAMOUS MUCOSA. - NEGATIVE FOR GOBLET CELLS, DYSPLASIA AND MALIGNANCY.  C.  ESOPHAGUS, 38 CM; COLD BIOPSY: - SQUAMOCOLUMNAR MUCOSA WITH INTESTINAL METAPLASIA, CONSISTENT WITH BARRETT'S ESOPHAGUS G IVEN ENDOSCOPIC FINDINGS. - NEGATIVE FOR DYSPLASIA AND MALIGNANCY.  D.  ESOPHAGUS, 36 CM; COLD BIOPSY: - SQUAMOCOLUMNAR MUCOSA WITH INTESTINAL METAPLASIA, CONSISTENT WITH BARRETT'S ESOPHAGUS GIVEN ENDOSCOPIC FINDINGS. - NEGATIVE FOR DYSPLASIA AND MALIGNANCY.    GROSS DESCRIPTION:  A. Labeled: C BX antrum and body of stomach  Tissue fragment(s): 3  Size: 0.2-0.4 cm  Description: tan fragments  Entirely submitted in 1 cassette(s).  B. Labeled: C BX 40 cm esophagus  Tissue fragment(s): 2  Size: 0.3-0.4 cm  Description: tan fragments  Entirely submitted in 1 cassette(s).  C. Labeled: C BX 38 cm esophagus  Tissue fragment(s): 2  Size: 0.1 and 0.3 cm  Description: Tan fragments  Entirely submitted in 1 cassette(s).  D. Labeled: C BX 36 cm  esophagus  Tissue fragment(s): multiple  Size: aggregate, 0.9 x 0.3 x 0.1 cm  Description: tan fragments  Entirely submitted in one cassette(s).    Final Diagnosis performed by Delorse Lek, MD.  Tandy Gaw cally signed 11/11/2016 10:16:39AM    The electronic signature indicates that the named Attending Pathologist has evaluated the specimen  Technical component performed at [facility], [address], [place], [place] [address] Lab: [phone] Dir: Darrick Penna. Evette Doffing, MD  Professional component performed at [facility], [facility], [place], [place], [place] [address] Lab: [phone] Dir: Dellia Nims. Reuel Derby, MD      Radiology No results found.    Assessment/Plan Obesity Discussed that weight loss would be very beneficial for wound healing and leg swelling.  Essential hypertension, benign blood pressure control important in reducing the progression of atherosclerotic disease. On appropriate oral medications.   Hyperlipidemia lipid control important in reducing the progression of atherosclerotic disease. Continue statin therapy   Lymphedema His wounds have healed and we are going to try to come out of the Unna boots today.  Wear compression stockings daily and elevate his legs.  Continue to walk.  return to clinic in 1 month.    Leotis Pain, MD  01/15/2017 11:52 AM    This note was created with Dragon medical transcription system.  Any errors from dictation are purely unintentional

## 2017-01-15 NOTE — Assessment & Plan Note (Signed)
His wounds have healed and we are going to try to come out of the [employer] today.  Wear compression stockings daily and elevate his legs.  Continue to walk.  return to clinic in 1 month.

## 2017-02-12 ENCOUNTER — Ambulatory Visit (INDEPENDENT_AMBULATORY_CARE_PROVIDER_SITE_OTHER): Payer: Medicaid Other | Admitting: Vascular Surgery

## 2017-03-10 DIAGNOSIS — K2270 Barrett's esophagus without dysplasia: Secondary | ICD-10-CM | POA: Insufficient documentation

## 2017-03-19 ENCOUNTER — Ambulatory Visit (INDEPENDENT_AMBULATORY_CARE_PROVIDER_SITE_OTHER): Payer: Medicaid Other | Admitting: Vascular Surgery

## 2017-03-19 ENCOUNTER — Encounter (INDEPENDENT_AMBULATORY_CARE_PROVIDER_SITE_OTHER): Payer: Self-pay | Admitting: Vascular Surgery

## 2017-03-19 VITALS — BP 141/91 | HR 99 | Resp 17 | Ht 69.0 in | Wt 318.0 lb

## 2017-03-19 DIAGNOSIS — I890 Lymphedema, not elsewhere classified: Secondary | ICD-10-CM | POA: Diagnosis not present

## 2017-03-19 DIAGNOSIS — E785 Hyperlipidemia, unspecified: Secondary | ICD-10-CM

## 2017-03-19 DIAGNOSIS — I10 Essential (primary) hypertension: Secondary | ICD-10-CM

## 2017-03-19 DIAGNOSIS — Z6841 Body Mass Index (BMI) 40.0 and over, adult: Secondary | ICD-10-CM

## 2017-03-19 NOTE — Progress Notes (Signed)
MRN : [id number]  [name] is a 52 y.o. (03-07-1965) male who presents with chief complaint of  Chief Complaint  Patient presents with  . Follow-up  .  History of Present Illness: Patient returns today in follow up of leg swelling and lymphedema.  He is using his compression stockings and elevating his legs.  His legs are stable and he has had no recurrent ulceration or infection.  His pain is mild at this point.  He has no fever or chills  Current Outpatient Prescriptions  Medication Sig Dispense Refill  . aspirin 325 MG EC tablet Take 325 mg by mouth daily.    Marland Kitchen aspirin-acetaminophen-caffeine (EXCEDRIN MIGRAINE) 250-250-65 MG tablet Take by mouth every 6 (six) hours as needed for headache.    Marland Kitchen atorvastatin (LIPITOR) 20 MG tablet Take 20 mg by mouth daily.    Marland Kitchen buPROPion (WELLBUTRIN SR) 150 MG 12 hr tablet Take 150 mg by mouth 2 (two) times daily.    . Fluticasone-Salmeterol (ADVAIR DISKUS) 250-50 MCG/DOSE AEPB Inhale 1 puff into the lungs 2 (two) times daily.    . furosemide (LASIX) 40 MG tablet Take 1 tablet (40 mg total) by mouth daily. 30 tablet 0  . hydrochlorothiazide (MICROZIDE) 12.5 MG capsule TK 1 C PO D FOR HIGH BP  0  . loratadine (CLARITIN) 10 MG tablet Take 10 mg by mouth daily.    Marland Kitchen losartan (COZAAR) 50 MG tablet Take 1 tablet (50 mg total) by mouth daily. 30 tablet 0  . metFORMIN (GLUCOPHAGE) 1000 MG tablet Take 1,000 mg by mouth 2 (two) times daily with a meal.    . omeprazole (PRILOSEC) 20 MG capsule Take 20 mg by mouth 2 (two) times daily.    Marland Kitchen oxybutynin (DITROPAN-XL) 10 MG 24 hr tablet Take 10 mg by mouth at bedtime.    . cephALEXin (KEFLEX) 500 MG capsule Take 1 capsule (500 mg total) by mouth 4 (four) times daily. (Patient not taking: Reported on 07/17/2016) 40 capsule 0  . nicotine (NICODERM CQ - DOSED IN MG/24 HOURS) 21 mg/24hr patch Place 1 patch (21 mg total) onto the skin daily. (Patient not taking: Reported on 07/17/2016) 28 patch 0    . oxyCODONE-acetaminophen (PERCOCET/ROXICET) 5-325 MG tablet Take 1 tablet by mouth every 6 (six) hours as needed for moderate pain. (Patient not taking: Reported on 03/24/2016) 15 tablet 0  . umeclidinium bromide (INCRUSE ELLIPTA) 62.5 MCG/INH AEPB Inhale 1 puff into the lungs daily.     No current facility-administered medications for this visit.        Past Medical History:  Diagnosis Date  . COPD (chronic obstructive pulmonary disease) ([place])   . Coronary artery disease   . Diabetes mellitus without complication ([place])   . Hypertension   . Peptic ulcer   . Sleep apnea     History reviewed. No pertinent surgical history.       Family History  Problem Relation Age of Onset  . Diabetes Mother   . Diabetes Father   . Stomach cancer Father   No bleeding or clotting disorders  Social History      Social History  Substance Use Topics  . Smoking status: Current Every Day Smoker    Packs/day: 1.00    Years: 30.00    Types: Cigarettes  . Smokeless tobacco: Never Used  . Alcohol use No  No IVDU      Allergies  Allergen Reactions  . Shellfish Allergy Swelling  . Codeine Rash  .  Lisinopril Cough    No current facility-administered medications for this visit.       REVIEW OF SYSTEMS(Negative unless checked)  Constitutional: [] Weight loss[] Fever[] Chills Cardiac:[] Chest pain[] Chest pressure[] Palpitations [] Shortness of breath when laying flat [] Shortness of breath at rest [x] Shortness of breath with exertion. Vascular: [] Pain in legs with walking[x] Pain in legsat rest[] Pain in legs when laying flat [] Claudication [] Pain in feet when walking [] Pain in feet at rest [] Pain in feet when laying flat [] History of DVT [] Phlebitis [x] Swelling in legs [] Varicose veins [x] Non-healing ulcers Pulmonary: [] Uses home oxygen [] Productive cough[] Hemoptysis [] Wheeze [x] COPD  [] Asthma Neurologic: [] Dizziness [] Blackouts [] Seizures [] History of stroke [] History of TIA[] Aphasia [] Temporary blindness[] Dysphagia [] Weaknessor numbness in arms [] Weakness or numbnessin legs Musculoskeletal: [] Arthritis [] Joint swelling [x] Joint pain [] Low back pain Hematologic:[] Easy bruising[] Easy bleeding [] Hypercoagulable state [] Anemic [] Hepatitis Gastrointestinal:[] Blood in stool[] Vomiting blood[] Gastroesophageal reflux/heartburn[] Abdominal pain Genitourinary: [] Chronic kidney disease [] Difficulturination [] Frequenturination [] Burning with urination[] Hematuria Skin: [] Rashes [x] Ulcers [x] Wounds Psychological: [] History of anxiety[] History of major depression.      Physical Examination  BP (!) 141/91   Pulse 99   Resp 17   Ht 5\' 9"  (1.753 m)   Wt (!) 318 lb (144.2 kg)   BMI 46.96 kg/m  Gen:  WD/WN, NAD.  Obese Head: [place]/AT, No temporalis wasting. Ear/Nose/Throat: Hearing grossly intact, nares w/o erythema or drainage, trachea midline Eyes: Conjunctiva clear. Sclera non-icteric Neck: Supple.  No JVD.  Pulmonary:  Good air movement, no use of accessory muscles.  Cardiac: RRR, normal S1, S2 Vascular:  Vessel Right Left  Radial Palpable Palpable                                    Musculoskeletal: M/S 5/5 throughout.  No deformity or atrophy.  1+ bilateral lower extremity edema.  Moderate stasis dermatitis changes present Neurologic: Sensation grossly intact in extremities.  Symmetrical.  Speech is fluent.  Psychiatric: Judgment intact, Mood & affect appropriate for pt's clinical situation. Dermatologic: No rashes or ulcers noted.  No cellulitis or open wounds.       Labs No results found for this or any previous visit (from the past 2160 hour(s)).  Radiology No results found.  Assessment/Plan Obesity Discussed that weight loss would be very beneficial for wound healing and leg  swelling.  Essential hypertension, benign blood pressure control important in reducing the progression of atherosclerotic disease. On appropriate oral medications.   Hyperlipidemia lipid control important in reducing the progression of atherosclerotic disease. Continue statin therapy   Lymphedema Symptoms are currently stable and well controlled with compression and elevation.  No intervention planned at this time.  Recheck in several months in the office or sooner if problems develop in the interim.    Leotis Pain, MD  03/19/2017 4:01 PM    This note was created with Dragon medical transcription system.  Any errors from dictation are purely unintentional

## 2017-03-19 NOTE — Assessment & Plan Note (Signed)
Symptoms are currently stable and well controlled with compression and elevation.  No intervention planned at this time.  Recheck in several months in the office or sooner if problems develop in the interim.

## 2017-03-19 NOTE — Patient Instructions (Signed)

## 2017-05-04 ENCOUNTER — Ambulatory Visit: Admission: RE | Admit: 2017-05-04 | Payer: Medicaid Other | Source: Ambulatory Visit | Admitting: Gastroenterology

## 2017-05-04 ENCOUNTER — Encounter: Admission: RE | Payer: Self-pay | Source: Ambulatory Visit

## 2017-05-04 SURGERY — COLONOSCOPY WITH PROPOFOL
Anesthesia: General

## 2017-06-17 ENCOUNTER — Other Ambulatory Visit: Payer: Self-pay | Admitting: Family Medicine

## 2017-06-17 DIAGNOSIS — G8929 Other chronic pain: Secondary | ICD-10-CM

## 2017-06-17 DIAGNOSIS — M545 Low back pain: Principal | ICD-10-CM

## 2017-06-18 ENCOUNTER — Ambulatory Visit (INDEPENDENT_AMBULATORY_CARE_PROVIDER_SITE_OTHER): Payer: Medicaid Other | Admitting: Vascular Surgery

## 2017-06-22 ENCOUNTER — Ambulatory Visit (INDEPENDENT_AMBULATORY_CARE_PROVIDER_SITE_OTHER): Payer: Medicaid Other | Admitting: Vascular Surgery

## 2017-07-06 ENCOUNTER — Ambulatory Visit: Payer: Medicaid Other | Admitting: Anesthesiology

## 2017-07-06 ENCOUNTER — Other Ambulatory Visit: Payer: Self-pay

## 2017-07-06 ENCOUNTER — Ambulatory Visit
Admission: RE | Admit: 2017-07-06 | Discharge: 2017-07-06 | Disposition: A | Discharge status: Home / Self Care | Payer: Medicaid Other | Source: Ambulatory Visit | Attending: Gastroenterology | Admitting: Gastroenterology

## 2017-07-06 ENCOUNTER — Encounter
Admission: RE | Disposition: A | Discharge status: Home / Self Care | Payer: Self-pay | Source: Ambulatory Visit | Attending: Gastroenterology

## 2017-07-06 ENCOUNTER — Encounter: Payer: Self-pay | Admitting: Anesthesiology

## 2017-07-06 DIAGNOSIS — Z79899 Other long term (current) drug therapy: Secondary | ICD-10-CM | POA: Diagnosis not present

## 2017-07-06 DIAGNOSIS — Z8711 Personal history of peptic ulcer disease: Secondary | ICD-10-CM | POA: Diagnosis not present

## 2017-07-06 DIAGNOSIS — Z1211 Encounter for screening for malignant neoplasm of colon: Secondary | ICD-10-CM | POA: Diagnosis not present

## 2017-07-06 DIAGNOSIS — Z888 Allergy status to other drugs, medicaments and biological substances status: Secondary | ICD-10-CM | POA: Diagnosis not present

## 2017-07-06 DIAGNOSIS — Z8719 Personal history of other diseases of the digestive system: Secondary | ICD-10-CM | POA: Diagnosis not present

## 2017-07-06 DIAGNOSIS — G4730 Sleep apnea, unspecified: Secondary | ICD-10-CM | POA: Diagnosis not present

## 2017-07-06 DIAGNOSIS — K3189 Other diseases of stomach and duodenum: Secondary | ICD-10-CM | POA: Diagnosis not present

## 2017-07-06 DIAGNOSIS — F17200 Nicotine dependence, unspecified, uncomplicated: Secondary | ICD-10-CM | POA: Diagnosis not present

## 2017-07-06 DIAGNOSIS — E119 Type 2 diabetes mellitus without complications: Secondary | ICD-10-CM | POA: Insufficient documentation

## 2017-07-06 DIAGNOSIS — Z7984 Long term (current) use of oral hypoglycemic drugs: Secondary | ICD-10-CM | POA: Insufficient documentation

## 2017-07-06 DIAGNOSIS — I10 Essential (primary) hypertension: Secondary | ICD-10-CM | POA: Diagnosis not present

## 2017-07-06 DIAGNOSIS — Z91013 Allergy to seafood: Secondary | ICD-10-CM | POA: Diagnosis not present

## 2017-07-06 DIAGNOSIS — K2270 Barrett's esophagus without dysplasia: Secondary | ICD-10-CM | POA: Insufficient documentation

## 2017-07-06 DIAGNOSIS — J449 Chronic obstructive pulmonary disease, unspecified: Secondary | ICD-10-CM | POA: Diagnosis not present

## 2017-07-06 DIAGNOSIS — K621 Rectal polyp: Secondary | ICD-10-CM | POA: Insufficient documentation

## 2017-07-06 DIAGNOSIS — K635 Polyp of colon: Secondary | ICD-10-CM | POA: Diagnosis not present

## 2017-07-06 DIAGNOSIS — K5730 Diverticulosis of large intestine without perforation or abscess without bleeding: Secondary | ICD-10-CM | POA: Insufficient documentation

## 2017-07-06 DIAGNOSIS — Z885 Allergy status to narcotic agent status: Secondary | ICD-10-CM | POA: Insufficient documentation

## 2017-07-06 DIAGNOSIS — K209 Esophagitis, unspecified: Secondary | ICD-10-CM | POA: Diagnosis not present

## 2017-07-06 DIAGNOSIS — I2510 Atherosclerotic heart disease of native coronary artery without angina pectoris: Secondary | ICD-10-CM | POA: Insufficient documentation

## 2017-07-06 DIAGNOSIS — Z7982 Long term (current) use of aspirin: Secondary | ICD-10-CM | POA: Insufficient documentation

## 2017-07-06 HISTORY — PX: COLONOSCOPY WITH PROPOFOL: SHX5780

## 2017-07-06 HISTORY — PX: ESOPHAGOGASTRODUODENOSCOPY (EGD) WITH PROPOFOL: SHX5813

## 2017-07-06 LAB — GLUCOSE, CAPILLARY: Glucose-Capillary: 108 mg/dL — ABNORMAL HIGH (ref 70–99)

## 2017-07-06 SURGERY — COLONOSCOPY WITH PROPOFOL
Anesthesia: General

## 2017-07-06 MED ORDER — MIDAZOLAM HCL 2 MG/2ML IJ SOLN
INTRAMUSCULAR | Status: AC
  Filled 2017-07-06: qty 2

## 2017-07-06 MED ORDER — LIDOCAINE HCL (PF) 2 % IJ SOLN
INTRAMUSCULAR | Status: AC
  Filled 2017-07-06: qty 10

## 2017-07-06 MED ORDER — SODIUM CHLORIDE 0.9 % IV SOLN
INTRAVENOUS | Status: DC
  Administered 2017-07-06: 10:00:00 via INTRAVENOUS

## 2017-07-06 MED ORDER — PROPOFOL 500 MG/50ML IV EMUL
INTRAVENOUS | Status: AC
  Filled 2017-07-06: qty 50

## 2017-07-06 MED ORDER — LIDOCAINE HCL (CARDIAC) PF 100 MG/5ML IV SOSY
PREFILLED_SYRINGE | INTRAVENOUS | Status: DC | PRN
  Administered 2017-07-06: 50 mg via INTRAVENOUS

## 2017-07-06 MED ORDER — PHENYLEPHRINE HCL 10 MG/ML IJ SOLN
INTRAMUSCULAR | Status: AC
  Filled 2017-07-06: qty 1

## 2017-07-06 MED ORDER — PHENYLEPHRINE HCL 10 MG/ML IJ SOLN
INTRAMUSCULAR | Status: DC | PRN
  Administered 2017-07-06 (×3): 50 ug via INTRAVENOUS

## 2017-07-06 MED ORDER — IPRATROPIUM-ALBUTEROL 0.5-2.5 (3) MG/3ML IN SOLN
RESPIRATORY_TRACT | Status: AC
  Filled 2017-07-06: qty 3

## 2017-07-06 MED ORDER — FENTANYL CITRATE (PF) 100 MCG/2ML IJ SOLN
INTRAMUSCULAR | Status: DC | PRN
  Administered 2017-07-06 (×2): 25 ug via INTRAVENOUS
  Administered 2017-07-06: 50 ug via INTRAVENOUS

## 2017-07-06 MED ORDER — FENTANYL CITRATE (PF) 100 MCG/2ML IJ SOLN
INTRAMUSCULAR | Status: AC
  Filled 2017-07-06: qty 2

## 2017-07-06 MED ORDER — IPRATROPIUM-ALBUTEROL 0.5-2.5 (3) MG/3ML IN SOLN: 3.0000 mL | Freq: Once | RESPIRATORY_TRACT | Status: DC

## 2017-07-06 MED ORDER — MIDAZOLAM HCL 2 MG/2ML IJ SOLN
INTRAMUSCULAR | Status: DC | PRN
  Administered 2017-07-06: 2 mg via INTRAVENOUS

## 2017-07-06 MED ORDER — PROPOFOL 500 MG/50ML IV EMUL
INTRAVENOUS | Status: DC | PRN
  Administered 2017-07-06: 140 ug/kg/min via INTRAVENOUS

## 2017-07-06 NOTE — H&P (Signed)
Outpatient short stay form Pre-procedure 07/06/2017 9:43 AM Jonathan Sails MD  Primary Physician: Dr. Maylene Roes  Reason for visit: EGD and colonoscopy  History of present illness: Personal history of Barrett's esophagus, follow-up gastric ulcer, colon cancer screening area patient is a 52 year old male presenting today as above.  He has a EGD and colonoscopy in October 2018.  The EGD showed him on biopsy of Barrett's esophagus as well as multiple small gastric ulcers.  Ever his colonoscopy was not complete due to bad prep.  Presents today for recheck on a gastric ulcer and completion of the colonoscopy.  He denies taking any aspirin products with the exception of daily 81 mg aspirin this been held for couple of days.  He does take Excedrin migraines frequently and denies taking those for about 2 weeks.  He takes no other aspirin products or blood thinning agents.  He tolerated his prep well.    Current Facility-Administered Medications:  .  0.9 %  sodium chloride infusion, , Intravenous, Continuous, Jonathan Sails, MD .  0.9 %  sodium chloride infusion, , Intravenous, Continuous, Jonathan Sails, MD .  ipratropium-albuterol (DUONEB) 0.5-2.5 (3) MG/3ML nebulizer solution 3 mL, 3 mL, Nebulization, Once, Piscitello, Precious Haws, MD .  ipratropium-albuterol (DUONEB) 0.5-2.5 (3) MG/3ML nebulizer solution, , , ,   Medications Prior to Admission  Medication Sig Dispense Refill Last Dose  . albuterol (PROVENTIL HFA) 108 (90 Base) MCG/ACT inhaler INHALE 2 PUFFS Q 4 TO 6 H PRF WHEEZING   07/05/2017 at 1000  . aspirin 325 MG EC tablet Take 325 mg by mouth daily.   Past Week at Unknown time  . aspirin EC 81 MG tablet Take 81 mg by mouth daily.   Past Week at Unknown time  . aspirin-acetaminophen-caffeine (EXCEDRIN MIGRAINE) 250-250-65 MG tablet Take by mouth every 6 (six) hours as needed for headache.   Past Week at Unknown time  . atorvastatin (LIPITOR) 20 MG tablet Take 20 mg by mouth daily.    07/05/2017 at Unknown time  . buPROPion (WELLBUTRIN SR) 150 MG 12 hr tablet Take 150 mg by mouth 2 (two) times daily.   07/05/2017 at 1000  . Fluticasone-Salmeterol (ADVAIR DISKUS) 250-50 MCG/DOSE AEPB Inhale 1 puff into the lungs 2 (two) times daily.   Past Month at Unknown time  . furosemide (LASIX) 40 MG tablet Take 1 tablet (40 mg total) by mouth daily. 30 tablet 0 07/05/2017 at 1000  . hydrochlorothiazide (MICROZIDE) 12.5 MG capsule TK 1 C PO D FOR HIGH BP  0 07/05/2017 at 1000  . loratadine (CLARITIN) 10 MG tablet Take 10 mg by mouth daily.   07/05/2017 at 1000  . losartan (COZAAR) 50 MG tablet Take 1 tablet (50 mg total) by mouth daily. 30 tablet 0 07/05/2017 at 10000  . metFORMIN (GLUCOPHAGE) 1000 MG tablet Take 1,000 mg by mouth 2 (two) times daily with a meal.   Past Week at Unknown time  . omeprazole (PRILOSEC) 20 MG capsule Take 20 mg by mouth 2 (two) times daily.   07/05/2017 at 1000  . oxybutynin (DITROPAN-XL) 10 MG 24 hr tablet Take 10 mg by mouth at bedtime.   07/05/2017 at 1000  . pantoprazole (PROTONIX) 40 MG tablet Take by mouth.   07/05/2017 at 1000  . tiotropium (SPIRIVA HANDIHALER) 18 MCG inhalation capsule INL THE CONTENTS OF 1 C VIA HANDIHALER PO D FOR COPD   07/05/2017 at 1000  . cephALEXin (KEFLEX) 500 MG capsule Take 1 capsule (500  mg total) by mouth 4 (four) times daily. (Patient not taking: Reported on 03/19/2017) 40 capsule 0 Completed Course at Unknown time  . nicotine (NICODERM CQ - DOSED IN MG/24 HOURS) 21 mg/24hr patch Place 1 patch (21 mg total) onto the skin daily. (Patient not taking: Reported on 03/19/2017) 28 patch 0 Not Taking at Unknown time  . oxyCODONE-acetaminophen (PERCOCET/ROXICET) 5-325 MG tablet Take 1 tablet by mouth every 6 (six) hours as needed for moderate pain. (Patient not taking: Reported on 07/06/2017) 15 tablet 0 Completed Course at Unknown time  . polyethylene glycol-electrolytes (NULYTELY/GOLYTELY) 420 g solution Take by mouth.   Not Taking at Unknown time   . umeclidinium bromide (INCRUSE ELLIPTA) 62.5 MCG/INH AEPB Inhale 1 puff into the lungs daily.   Not Taking at Unknown time     Allergies  Allergen Reactions  . Shellfish Allergy Swelling  . Codeine Rash  . Lisinopril Cough     Past Medical History:  Diagnosis Date  . COPD (chronic obstructive pulmonary disease) ([place])   . Coronary artery disease   . Diabetes mellitus without complication ([place])   . Hypertension   . Peptic ulcer   . Sleep apnea     Review of systems:      Physical Exam    Heart and lungs: Regular rate and rhythm without rub or gallop, lungs are bilaterally clear.    HEENT: Normocephalic atraumatic eyes are anicteric    Other:    Pertinant exam for procedure: Soft nontender nondistended bowel sounds positive normoactive.    Planned proceedures: EGD, colonoscopy and indicated procedures. I have discussed the risks benefits and complications of procedures to include not limited to bleeding, infection, perforation and the risk of sedation and the patient wishes to proceed.  Jonathan Sails, MD Gastroenterology 07/06/2017  9:43 AM

## 2017-07-06 NOTE — Op Note (Signed)
[facility] Gastroenterology Patient Name: [name] Procedure Date: 07/06/2017 9:33 AM MRN: [id number] Account #: [id number] Date of Birth: 02-Feb-1965 Admit Type: Outpatient Age: 52 Room: [facility] ENDO ROOM 3 Gender: Male Note Status: Finalized Procedure:            Upper GI endoscopy Indications:          Follow-up of Barrett's esophagus, Follow-up of acute                        gastric ulcer Providers:            Lollie Sails, MD Medicines:            Monitored Anesthesia Care Complications:        No immediate complications. Procedure:            Pre-Anesthesia Assessment:                       - ASA Grade Assessment: III - A patient with severe                        systemic disease.                       After obtaining informed consent, the endoscope was                        passed under direct vision. Throughout the procedure,                        the patient's blood pressure, pulse, and oxygen                        saturations were monitored continuously. The Endoscope                        was introduced through the mouth, and advanced to the                        third part of duodenum. The upper GI endoscopy was                        accomplished without difficulty. The patient tolerated                        the procedure well. Findings:      There were esophageal mucosal changes consistent with long-segment       Barrett's esophagus present in the lower third of the esophagus. The       maximum longitudinal extent of these mucosal changes was 8 cm in length.       Mucosa was biopsied with a cold forceps for histology in 4 quadrants at       intervals of 2 cm at 36, 38, 40, 42 and 44 cm from the incisors. A total       of 5 specimen bottles were sent to pathology.      Patchy mild inflammation characterized by congestion (edema), erosions       and erythema was found in the gastric antrum. Biopsies were taken with a       cold  forceps for histology. Biopsies were taken with a cold forceps  for       Helicobacter pylori testing. Previously noted ulcedr are healed       otherwise.      The cardia and gastric fundus were normal on retroflexion otherwise.      Diffuse mild mucosal variance characterized by smoothness was found in       the entire duodenum. Biopsies were taken with a cold forceps for       histology. Impression:           - Esophageal mucosal changes consistent with                        long-segment Barrett's esophagus. Biopsied.                       - Erosive gastritis. Biopsied.                       - Mucosal variant in the duodenum. Biopsied. Recommendation:       - Use Protonix (pantoprazole) 40 mg PO daily daily.                       - Await pathology results.                       - Return to GI clinic in 1 month. Procedure Code(s):    --- Professional ---                       [phone], Esophagogastroduodenoscopy, flexible, transoral;                        with biopsy, single or multiple Diagnosis Code(s):    --- Professional ---                       K22.70, Barrett's esophagus without dysplasia                       K29.60, Other gastritis without bleeding                       K31.89, Other diseases of stomach and duodenum                       K25.3, Acute gastric ulcer without hemorrhage or                        perforation CPT copyright 2017 American Medical Association. All rights reserved. The codes documented in this report are preliminary and upon coder review may  be revised to meet current compliance requirements. Lollie Sails, MD 07/06/2017 10:29:40 AM This report has been signed electronically. Number of Addenda: 0 Note Initiated On: 07/06/2017 9:33 AM      [facility]

## 2017-07-06 NOTE — Anesthesia Preprocedure Evaluation (Signed)
Anesthesia Evaluation  Patient identified by MRN, date of birth, ID band Patient awake    Reviewed: Allergy & Precautions, H&P , NPO status , Patient's Chart, lab work & pertinent test results  History of Anesthesia Complications Negative for: history of anesthetic complications  Airway Mallampati: III  TM Distance: <3 FB Neck ROM: full    Dental  (+) Chipped, Poor Dentition, Missing, Edentulous Upper   Pulmonary neg shortness of breath, sleep apnea , COPD, Current Smoker,           Cardiovascular Exercise Tolerance: Good hypertension, (-) angina+ CAD       Neuro/Psych negative neurological ROS  negative psych ROS   GI/Hepatic Neg liver ROS, PUD,   Endo/Other  diabetes, Type 2  Renal/GU negative Renal ROS  negative genitourinary   Musculoskeletal   Abdominal   Peds  Hematology negative hematology ROS (+)   Anesthesia Other Findings Past Medical History: No date: COPD (chronic obstructive pulmonary disease) ([facility]) No date: Coronary artery disease No date: Diabetes mellitus without complication ([facility]) No date: Hypertension No date: Peptic ulcer No date: Sleep apnea  Past Surgical History: 11/09/2016: COLONOSCOPY WITH PROPOFOL; N/A     Comment:  Procedure: COLONOSCOPY WITH PROPOFOL;  Surgeon:               Lollie Sails, MD;  Location: [facility] ENDOSCOPY;                Service: Endoscopy;  Laterality: N/A; 11/09/2016: ESOPHAGOGASTRODUODENOSCOPY (EGD) WITH PROPOFOL; N/A     Comment:  Procedure: ESOPHAGOGASTRODUODENOSCOPY (EGD) WITH               PROPOFOL;  Surgeon: Lollie Sails, MD;  Location:               [facility] ENDOSCOPY;  Service: Endoscopy;  Laterality: N/A;  BMI    Body Mass Index:  46.67 kg/m      Reproductive/Obstetrics negative OB ROS                             Anesthesia Physical Anesthesia Plan  ASA: III  Anesthesia Plan: General   Post-op Pain Management:     Induction: Intravenous  PONV Risk Score and Plan: Propofol infusion and TIVA  Airway Management Planned: Natural Airway and Nasal Cannula  Additional Equipment:   Intra-op Plan:   Post-operative Plan:   Informed Consent: I have reviewed the patients History and Physical, chart, labs and discussed the procedure including the risks, benefits and alternatives for the proposed anesthesia with the patient or authorized representative who has indicated his/her understanding and acceptance.   Dental Advisory Given  Plan Discussed with: Anesthesiologist, CRNA and Surgeon  Anesthesia Plan Comments: (Patient consented for risks of anesthesia including but not limited to:  - adverse reactions to medications - risk of intubation if required - damage to teeth, lips or other oral mucosa - sore throat or hoarseness - Damage to heart, brain, lungs or loss of life  Patient voiced understanding.)        Anesthesia Quick Evaluation

## 2017-07-06 NOTE — Anesthesia Post-op Follow-up Note (Signed)
Anesthesia QCDR form completed.        

## 2017-07-06 NOTE — Op Note (Signed)
[facility] Gastroenterology Patient Name: [name] Procedure Date: 07/06/2017 9:32 AM MRN: [id number] Account #: [id number] Date of Birth: 09-06-1965 Admit Type: Outpatient Age: 52 Room: [facility] ENDO ROOM 3 Gender: Male Note Status: Finalized Procedure:            Colonoscopy Indications:          Screening for colorectal malignant neoplasm Providers:            Lollie Sails, MD Medicines:            Monitored Anesthesia Care Complications:        No immediate complications. Procedure:            Pre-Anesthesia Assessment:                       - ASA Grade Assessment: III - A patient with severe                        systemic disease.                       After obtaining informed consent, the colonoscope was                        passed under direct vision. Throughout the procedure,                        the patient's blood pressure, pulse, and oxygen                        saturations were monitored continuously. The                        Colonoscope was introduced through the anus and                        advanced to the the terminal ileum. The colonoscopy was                        performed without difficulty. The patient tolerated the                        procedure well. The quality of the bowel preparation                        was fair. Findings:      Two sessile polyps were found in the mid sigmoid colon. The polyps were       5 to 8 mm in size. These polyps were removed with a cold snare.       Resection and retrieval were complete.      A 3 mm polyp was found in the rectum. The polyp was sessile. The polyp       was removed with a cold biopsy forceps. Resection and retrieval were       complete.      Multiple medium-mouthed diverticula were found in the sigmoid colon and       descending colon.      The terminal ileum appeared normal. Biopsies were taken with a cold       forceps for histology.      Patient with multiple fistula  appearinbg openings in the  gluteal fold       posterior to the anal orifice. No evidence of filtula noted in the       colonic pathway.      The exam was otherwise without abnormality.Biopsies for histology were       taken with a cold forceps from the right colon and left colon for       evaluation of microscopic colitis. Impression:           - Preparation of the colon was fair.                       - Two 5 to 8 mm polyps in the mid sigmoid colon,                        removed with a cold snare. Resected and retrieved.                       - One 3 mm polyp in the rectum, removed with a cold                        biopsy forceps. Resected and retrieved.                       - Diverticulosis in the sigmoid colon and in the                        descending colon.                       - The examined portion of the ileum was normal.                        Biopsied.                       - The examination was otherwise normal. Recommendation:       - Discharge patient to home.                       - Soft diet today, then advance as tolerated to advance                        diet as tolerated.                       - Return to GI clinic in 1 month. Procedure Code(s):    --- Professional ---                       [id number], Colonoscopy, flexible; with removal of tumor(s),                        polyp(s), or other lesion(s) by snare technique                       54008, 24, Colonoscopy, flexible; with biopsy, single                        or multiple Diagnosis Code(s):    --- Professional ---  Z12.11, Encounter for screening for malignant neoplasm                        of colon                       D12.5, Benign neoplasm of sigmoid colon                       K62.1, Rectal polyp                       K57.30, Diverticulosis of large intestine without                        perforation or abscess without bleeding CPT copyright 2017 American Medical Association. All  rights reserved. The codes documented in this report are preliminary and upon coder review may  be revised to meet current compliance requirements. Lollie Sails, MD 07/06/2017 10:58:28 AM This report has been signed electronically. Number of Addenda: 0 Note Initiated On: 07/06/2017 9:32 AM Scope Withdrawal Time: 0 hours 12 minutes 37 seconds  Total Procedure Duration: 0 hours 17 minutes 57 seconds       [facility]

## 2017-07-06 NOTE — Transfer of Care (Signed)
Immediate Anesthesia Transfer of Care Note  Patient: [name]  Procedure(s) Performed: COLONOSCOPY WITH PROPOFOL (N/A ) ESOPHAGOGASTRODUODENOSCOPY (EGD) WITH PROPOFOL (N/A )  Patient Location: PACU  Anesthesia Type:General  Level of Consciousness: awake and sedated  Airway & Oxygen Therapy: Patient Spontanous Breathing and Patient connected to face mask oxygen  Post-op Assessment: Report given to RN and Post -op Vital signs reviewed and stable  Post vital signs: Reviewed and stable  Last Vitals:  Vitals Value Taken Time  BP    Temp    Pulse    Resp    SpO2      Last Pain:  Vitals:   07/06/17 0927  TempSrc: Tympanic  PainSc: 0-No pain         Complications: No apparent anesthesia complications

## 2017-07-06 NOTE — Anesthesia Postprocedure Evaluation (Signed)
Anesthesia Post Note  Patient: [name]  Procedure(s) Performed: COLONOSCOPY WITH PROPOFOL (N/A ) ESOPHAGOGASTRODUODENOSCOPY (EGD) WITH PROPOFOL (N/A )  Patient location during evaluation: Endoscopy Anesthesia Type: General Level of consciousness: awake and alert Pain management: pain level controlled Vital Signs Assessment: post-procedure vital signs reviewed and stable Respiratory status: spontaneous breathing, nonlabored ventilation, respiratory function stable and patient connected to nasal cannula oxygen Cardiovascular status: blood pressure returned to baseline and stable Postop Assessment: no apparent nausea or vomiting Anesthetic complications: no     Last Vitals:  Vitals:   07/06/17 1120 07/06/17 1130  BP:  105/68  Pulse: 87 81  Resp: 20 20  Temp:    SpO2: 98% 96%    Last Pain:  Vitals:   07/06/17 1057  TempSrc: Tympanic  PainSc:                  Precious Haws Shervin Cypert

## 2017-07-06 NOTE — Anesthesia Procedure Notes (Signed)
Performed by: Vaughan Sine Pre-anesthesia Checklist: Patient identified, Emergency Drugs available, Suction available, Patient being monitored and Timeout performed Patient Re-evaluated:Patient Re-evaluated prior to induction Oxygen Delivery Method: Nasal cannula and Simple face mask Preoxygenation: Pre-oxygenation with 100% oxygen Induction Type: IV induction Ventilation: Nasal airway inserted- appropriate to patient size Airway Equipment and Method: Bite block Placement Confirmation: positive ETCO2 and CO2 detector

## 2017-07-08 LAB — SURGICAL PATHOLOGY

## 2017-07-09 ENCOUNTER — Encounter: Payer: Self-pay | Admitting: Gastroenterology

## 2017-07-20 ENCOUNTER — Ambulatory Visit
Admission: RE | Admit: 2017-07-20 | Discharge: 2017-07-20 | Disposition: A | Discharge status: Home / Self Care | Payer: Medicaid Other | Source: Ambulatory Visit | Attending: Family Medicine | Admitting: Family Medicine

## 2017-07-20 ENCOUNTER — Encounter (INDEPENDENT_AMBULATORY_CARE_PROVIDER_SITE_OTHER): Payer: Self-pay

## 2017-07-20 DIAGNOSIS — M48061 Spinal stenosis, lumbar region without neurogenic claudication: Secondary | ICD-10-CM | POA: Diagnosis not present

## 2017-07-20 DIAGNOSIS — M5127 Other intervertebral disc displacement, lumbosacral region: Secondary | ICD-10-CM | POA: Diagnosis not present

## 2017-07-20 DIAGNOSIS — G8929 Other chronic pain: Secondary | ICD-10-CM | POA: Insufficient documentation

## 2017-07-20 DIAGNOSIS — M5126 Other intervertebral disc displacement, lumbar region: Secondary | ICD-10-CM | POA: Insufficient documentation

## 2017-07-20 DIAGNOSIS — M545 Low back pain: Secondary | ICD-10-CM | POA: Diagnosis present

## 2017-09-07 ENCOUNTER — Ambulatory Visit: Payer: Medicaid Other | Admitting: Nurse Practitioner

## 2017-09-30 ENCOUNTER — Ambulatory Visit: Payer: Medicaid Other | Admitting: Nurse Practitioner

## 2017-10-07 ENCOUNTER — Ambulatory Visit: Payer: Medicaid Other | Attending: Nurse Practitioner | Admitting: Nurse Practitioner

## 2017-10-07 ENCOUNTER — Encounter: Payer: Self-pay | Admitting: Nurse Practitioner

## 2017-10-07 DIAGNOSIS — Z833 Family history of diabetes mellitus: Secondary | ICD-10-CM | POA: Insufficient documentation

## 2017-10-07 DIAGNOSIS — E669 Obesity, unspecified: Secondary | ICD-10-CM | POA: Insufficient documentation

## 2017-10-07 DIAGNOSIS — Z800 Family history of malignant neoplasm of digestive organs: Secondary | ICD-10-CM | POA: Diagnosis not present

## 2017-10-07 DIAGNOSIS — M5442 Lumbago with sciatica, left side: Secondary | ICD-10-CM | POA: Diagnosis not present

## 2017-10-07 DIAGNOSIS — E119 Type 2 diabetes mellitus without complications: Secondary | ICD-10-CM | POA: Insufficient documentation

## 2017-10-07 DIAGNOSIS — G894 Chronic pain syndrome: Secondary | ICD-10-CM | POA: Insufficient documentation

## 2017-10-07 DIAGNOSIS — F17210 Nicotine dependence, cigarettes, uncomplicated: Secondary | ICD-10-CM | POA: Insufficient documentation

## 2017-10-07 DIAGNOSIS — Z7984 Long term (current) use of oral hypoglycemic drugs: Secondary | ICD-10-CM | POA: Diagnosis not present

## 2017-10-07 DIAGNOSIS — M79605 Pain in left leg: Secondary | ICD-10-CM

## 2017-10-07 DIAGNOSIS — E785 Hyperlipidemia, unspecified: Secondary | ICD-10-CM | POA: Diagnosis not present

## 2017-10-07 DIAGNOSIS — M79604 Pain in right leg: Secondary | ICD-10-CM | POA: Diagnosis not present

## 2017-10-07 DIAGNOSIS — Z789 Other specified health status: Secondary | ICD-10-CM | POA: Insufficient documentation

## 2017-10-07 DIAGNOSIS — I890 Lymphedema, not elsewhere classified: Secondary | ICD-10-CM | POA: Insufficient documentation

## 2017-10-07 DIAGNOSIS — Z6841 Body Mass Index (BMI) 40.0 and over, adult: Secondary | ICD-10-CM | POA: Diagnosis not present

## 2017-10-07 DIAGNOSIS — M545 Low back pain: Secondary | ICD-10-CM | POA: Diagnosis present

## 2017-10-07 DIAGNOSIS — I10 Essential (primary) hypertension: Secondary | ICD-10-CM | POA: Diagnosis not present

## 2017-10-07 DIAGNOSIS — I2510 Atherosclerotic heart disease of native coronary artery without angina pectoris: Secondary | ICD-10-CM | POA: Insufficient documentation

## 2017-10-07 DIAGNOSIS — M5441 Lumbago with sciatica, right side: Secondary | ICD-10-CM | POA: Insufficient documentation

## 2017-10-07 DIAGNOSIS — Z79899 Other long term (current) drug therapy: Secondary | ICD-10-CM

## 2017-10-07 DIAGNOSIS — J449 Chronic obstructive pulmonary disease, unspecified: Secondary | ICD-10-CM | POA: Diagnosis not present

## 2017-10-07 DIAGNOSIS — Z8711 Personal history of peptic ulcer disease: Secondary | ICD-10-CM | POA: Diagnosis not present

## 2017-10-07 DIAGNOSIS — M899 Disorder of bone, unspecified: Secondary | ICD-10-CM

## 2017-10-07 DIAGNOSIS — G4730 Sleep apnea, unspecified: Secondary | ICD-10-CM | POA: Insufficient documentation

## 2017-10-07 DIAGNOSIS — G8929 Other chronic pain: Secondary | ICD-10-CM

## 2017-10-07 DIAGNOSIS — Z7951 Long term (current) use of inhaled steroids: Secondary | ICD-10-CM | POA: Diagnosis not present

## 2017-10-07 NOTE — Progress Notes (Signed)
Patient's Name: [name]  MRN: [id number]  Referring Provider: Theotis Burrow*  DOB: 1965-02-13  PCP: Theotis Burrow, MD  DOS: 10/07/2017  Note by: Dionisio David NP  Service setting: Ambulatory outpatient  Specialty: Interventional Pain Management  Location: [facility] (AMB) Pain Management Facility    Patient type: New Patient    Primary Reason(s) for Visit: Initial Patient Evaluation CC: Back Pain (lumbar bilateral ) and Leg Pain (bilateral, edema and cellultitis )  HPI  Mr. Damiano is a 52 y.o. year old, male patient, who comes today for an initial evaluation. He has Cellulitis; Essential hypertension, benign; Hyperlipidemia; Tobacco dependence; Obesity; Lower limb ulcer, ankle, left, with fat layer exposed ([place]); Lymphedema; Barrett's esophagus without dysplasia; Colon cancer screening; Chronic bilateral low back pain with bilateral sciatica (Primary Area of Pain) (R>L); Chronic pain of both lower extremities (Secondary Area of Pain) (R>L); Chronic pain syndrome; Pharmacologic therapy; Disorder of skeletal system; and Problems influencing health status on their problem list.. His primarily concern today is the Back Pain (lumbar bilateral ) and Leg Pain (bilateral, edema and cellultitis )  Pain Assessment: Location: Lower, Left, Right Back Radiating: down both legs to the toes  Onset: More than a month ago Duration: Chronic pain Quality: Discomfort, Constant, Throbbing(when legs are swollen it is difficult to get out of bed, feels like feet are going to split open ) Severity: 9 /10 (subjective, self-reported pain score)  Note: Reported level is compatible with observation. Clinically the patient looks like a 2/10 A 2/10 is viewed as "Mild to Moderate" and described as noticeable and distracting. Impossible to hide from other people. More frequent flare-ups. Still possible to adapt and function close to normal. It can be very annoying and may have occasional stronger flare-ups.  With discipline, patients may get used to it and adapt. Information on the proper use of the pain scale provided to the patient today. When using our objective Pain Scale, levels between 6 and 10/10 are said to belong in an emergency room, as it progressively worsens from a 6/10, described as severely limiting, requiring emergency care not usually available at an outpatient pain management facility. At a 6/10 level, communication becomes difficult and requires great effort. Assistance to reach the emergency department may be required. Facial flushing and profuse sweating along with potentially dangerous increases in heart rate and blood pressure will be evident. Effect on ADL: difficult to get shopping done or household chores done.  Timing: Constant Modifying factors: rest BP: (!) 158/91  HR: 95  Onset and Duration: Gradual and Date of onset: 04/11 Cause of pain: Work related accident or event Severity: Getting worse, NAS-11 at its worse: 10/10, NAS-11 at its best: 8/10, NAS-11 now: 8/10 and NAS-11 on the average: 8/10 Timing: Not influenced by the time of the day and During activity or exercise Aggravating Factors: Prolonged standing and Walking Alleviating Factors: Lying down and Resting Associated Problems: Day-time cramps, Night-time cramps, Depression, Fatigue, Inability to control bladder (urine), Nausea, Numbness, Sadness, Swelling and Weakness Quality of Pain: Aching, Disabling, Exhausting and Throbbing Previous Examinations or Tests: CT scan and MRI scan Previous Treatments: Relaxation therapy  The patient comes into the clinics today for the first time for a chronic pain management evaluation.  According to the patient his primary area pain is in his lower back.  He admits that this has been going on since 2011 52 since he suffered a fall down 9 semen steps.  He admits the pain has gotten worse.  He admits the pain is across his back and is worse with standing.  He describes as a throbbing  pain.  He denies any previous surgery or interventional therapy.  He admits that he did have physical therapy in the past somewhat effective.  He has had a recent MRI.  His second area of pain is in his legs.  He admits that the leg pain is twofold.  He has pain that goes down his the backs of his legs to his heels when his back pain is worse.  He also admits that he has generalized leg pain from the knee down secondary to recurrent cellulitis and swelling. He denies any previous surgeries. He admits that he has had a recent ultrasound.    Today I took the time to provide the patient with information regarding this pain practice. The patient was informed that the practice is divided into two sections: an interventional pain management section, as well as a completely separate and distinct medication management section. I explained that there are procedure days for interventional therapies, and evaluation days for follow-ups and medication management. Because of the amount of documentation required during both, they are kept separated. This means that there is the possibility that hemay be scheduled for a procedure on one day, and medication management the next. I have also informed him that because of staffing and facility limitations, this practice will no longer take patients for medication management only. To illustrate the reasons for this, I gave the patient the example of surgeons, and how inappropriate it would be to refer a patient to his/her care, just to write for the post-surgical antibiotics on a surgery done by a different surgeon.   Because interventional pain management is part of the board-certified specialty for the doctors, the patient was informed that joining this practice means that they are open to any and all interventional therapies. I made it clear that this does not mean that they will be forced to have any procedures done. What this means is that I believe interventional therapies to  be essential part of the diagnosis and proper management of chronic pain conditions. Therefore, patients not interested in these interventional alternatives will be better served under the care of a different practitioner.  The patient was also made aware of my Comprehensive Pain Management Safety Guidelines where by joining this practice, they limit all of their nerve blocks and joint injections to those done by our practice, for as long as we are retained to manage their care. Historic Controlled Substance Pharmacotherapy Review  PMP and historical list of controlled substances: Oxycodone/acetaminophen 5/325 mg  Highest opioid analgesic regimen found: Oxycodone/acetaminophen 5/325 mg 1 tablet 3 times daily (fill date 01/09/2016) oxycodone 15 mg/day Most recent opioid analgesic: None Current opioid analgesics: None Highest recorded MME/day: 28 mg/day MME/day: 0 mg/day Medications: The patient did not bring the medication(s) to the appointment, as requested in our "New Patient Package" Pharmacodynamics: Desired effects: Analgesia: The patient reports >50% benefit. Reported improvement in function: The patient reports medication allows him to accomplish basic ADLs. Clinically meaningful improvement in function (CMIF): Sustained CMIF goals met Perceived effectiveness: Described as relatively effective, allowing for increase in activities of daily living (ADL) Undesirable effects: Side-effects or Adverse reactions: None reported Historical Monitoring: The patient  reports that he does not use drugs. List of all UDS Test(s): No results found for: MDMA, COCAINSCRNUR, PCPSCRNUR, PCPQUANT, CANNABQUANT, THCU, [place] List of all Serum Drug Screening Test(s):  No results found for: AMPHSCRSER,  BARBSCRSER, BENZOSCRSER, COCAINSCRSER, PCPSCRSER, PCPQUANT, THCSCRSER, CANNABQUANT, OPIATESCRSER, [place], [place] Historical Background Evaluation: [place] PDMP: Six (6) year initial data search conducted.              [place] Department of public safety, offender search: Editor, commissioning Information) Non-contributory Risk Assessment Profile: Aberrant behavior: None observed or detected today Risk factors for fatal opioid overdose: age 71-74 years old, caucasian, male gender and signs of non-medical use of Opioids Fatal overdose hazard ratio (HR): Calculation deferred Non-fatal overdose hazard ratio (HR): Calculation deferred Risk of opioid abuse or dependence: 0.7-3.0% with doses ? 36 MME/day and 6.1-26% with doses ? 120 MME/day. Substance use disorder (SUD) risk level: Pending results of Medical Psychology Evaluation for SUD Opioid risk tool (ORT) (Total Score):    ORT Scoring interpretation table:  Score <3 = Low Risk for SUD  Score between 4-7 = Moderate Risk for SUD  Score >8 = High Risk for Opioid Abuse   PHQ-2 Depression Scale:  Total score: 6  PHQ-2 Scoring interpretation table: (Score and probability of major depressive disorder)  Score 0 = No depression  Score 1 = 15.4% Probability  Score 2 = 21.1% Probability  Score 3 = 38.4% Probability  Score 4 = 45.5% Probability  Score 5 = 56.4% Probability  Score 6 = 78.6% Probability   PHQ-9 Depression Scale:  Total score: 27  PHQ-9 Scoring interpretation table:  Score 0-4 = No depression  Score 5-9 = Mild depression  Score 10-14 = Moderate depression  Score 15-19 = Moderately severe depression  Score 20-27 = Severe depression (2.4 times higher risk of SUD and 2.89 times higher risk of overuse)   Pharmacologic Plan: Pending ordered tests and/or consults  Meds  The patient has a current medication list which includes the following prescription(s): albuterol, atorvastatin, bupropion, fluticasone-salmeterol, furosemide, hydrochlorothiazide, loratadine, losartan, metformin, oxybutynin, pantoprazole, and tiotropium.  Current Outpatient Medications on File Prior to Visit  Medication Sig  . albuterol (VENTOLIN HFA) 108 (90 Base) MCG/ACT inhaler Inhale  2 puffs into the lungs every 6 (six) hours as needed for wheezing or shortness of breath.  Marland Kitchen atorvastatin (LIPITOR) 20 MG tablet Take 20 mg by mouth daily.  Marland Kitchen buPROPion (WELLBUTRIN SR) 150 MG 12 hr tablet Take 150 mg by mouth 2 (two) times daily.  . Fluticasone-Salmeterol (ADVAIR DISKUS) 250-50 MCG/DOSE AEPB Inhale 1 puff into the lungs 2 (two) times daily.  . furosemide (LASIX) 40 MG tablet Take 1 tablet (40 mg total) by mouth daily.  . hydrochlorothiazide (MICROZIDE) 12.5 MG capsule TK 1 C PO D FOR HIGH BP  . loratadine (CLARITIN) 10 MG tablet Take 10 mg by mouth daily.  Marland Kitchen losartan (COZAAR) 50 MG tablet Take 1 tablet (50 mg total) by mouth daily.  . metFORMIN (GLUCOPHAGE) 1000 MG tablet Take 1,000 mg by mouth 2 (two) times daily with a meal.  . oxybutynin (DITROPAN-XL) 10 MG 24 hr tablet Take 10 mg by mouth at bedtime.  . pantoprazole (PROTONIX) 40 MG tablet Take by mouth.  . tiotropium (SPIRIVA HANDIHALER) 18 MCG inhalation capsule INL THE CONTENTS OF 1 C VIA HANDIHALER PO D FOR COPD   No current facility-administered medications on file prior to visit.    Imaging Review  Lumbosacral Imaging: Lumbar MR wo contrast:  Results for orders placed during the hospital encounter of 07/20/17  MR LUMBAR SPINE WO CONTRAST   Narrative CLINICAL DATA:  Chronic back pain radiating into both legs with weakness since falling 8 years ago. No previous relevant surgery.  EXAM: MRI LUMBAR SPINE WITHOUT CONTRAST  TECHNIQUE: Multiplanar, multisequence MR imaging of the lumbar spine was performed. No intravenous contrast was administered.  COMPARISON:  None.  FINDINGS: Segmentation: Conventional anatomy assumed, with the last open disc space designated L5-S1.  Alignment:  Normal.  Vertebrae: No worrisome osseous lesion, acute fracture or pars defect. 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 The visualized sacroiliac joints appear unremarkable.  Conus medullaris: Extends to  the L1 level and appears normal.  Paraspinal and other soft tissues: No significant paraspinal findings.  Disc levels:  From T12-L1 through L3-4, the discs are well hydrated with maintained height. There is no disc herniation, spinal stenosis or nerve root encroachment. Mild facet hypertrophy is present at L3-4.  L4-5: Disc desiccation with minimal loss of height. There is a small central disc protrusion with mild facet and ligamentous hypertrophy. Superimposed on congenital factors, there is resulting mild spinal stenosis and mild narrowing of the lateral recesses, left greater than right. The foramina are sufficiently patent.  L5-S1: Disc desiccation with mild loss of height. There is a moderate size central disc extrusion with mass effect on the thecal sac and probable encroachment on both S1 nerve roots in the lateral recesses. There is mild bilateral facet hypertrophy. There is mild left foraminal narrowing.  IMPRESSION: 1. Moderate size central disc extrusion at L5-S1 with mass effect on the thecal sac and probable encroachment on both S1 nerve roots in the lateral recesses. 2. Small central disc protrusion at L4-5. Superimposed on congenital factors, there is mild resulting spinal stenosis and mild narrowing of the lateral recesses, left greater than right. Findings at this level could be symptomatic as well. 3. No other significant acquired findings. 4. Congenitally short pedicles.   Electronically Signed   By: Richardean Sale M.D.   On: 07/20/2017 14:23    Note: Available results from prior imaging studies were reviewed.        ROS  Cardiovascular History: Daily Aspirin intake and High blood pressure Pulmonary or Respiratory History: Lung problems, Shortness of breath, Smoking, Snoring  and Temporary stoppage of breathing during sleep Neurological History: No reported neurological signs or symptoms such as seizures, abnormal skin sensations, urinary and/or fecal  incontinence, being born with an abnormal open spine and/or a tethered spinal cord Review of Past Neurological Studies: No results found for this or any previous visit. Psychological-Psychiatric History: Anxiousness, Depressed, Prone to panicking, Suicidal ideations, Attempted suicide, History of abuse and Difficulty sleeping and or falling asleep Gastrointestinal History: Vomiting blood (Ulcers) and Reflux or heatburn Genitourinary History: Peeing blood Hematological History: No reported hematological signs or symptoms such as prolonged bleeding, low or poor functioning platelets, bruising or bleeding easily, hereditary bleeding problems, low energy levels due to low hemoglobin or being anemic Endocrine History: High blood sugar controlled without the use of insulin (NIDDM) Rheumatologic History: No reported rheumatological signs and symptoms such as fatigue, joint pain, tenderness, swelling, redness, heat, stiffness, decreased range of motion, with or without associated rash Musculoskeletal History: Negative for myasthenia gravis, muscular dystrophy, multiple sclerosis or malignant hyperthermia Work History: Disabled and Out of work due to pain  Allergies  Mr. Gregson is allergic to shellfish allergy; codeine; and lisinopril.  Laboratory Chemistry  Inflammation Markers No results found for: CRP, ESRSEDRATE (CRP: Acute Phase) (ESR: Chronic Phase) Renal Function Markers Lab Results  Component Value Date   BUN 14 01/07/2016   CREATININE 1.15 01/07/2016   GFRAA >60 01/07/2016   GFRNONAA >60 01/07/2016  Hepatic Function Markers No results found for: AST, ALT, ALBUMIN, ALKPHOS, HCVAB Electrolytes Lab Results  Component Value Date   NA 138 01/07/2016   K 4.8 01/07/2016   CL 103 01/07/2016   CALCIUM 9.0 01/07/2016   Neuropathy Markers No results found for: [id number] Bone Pathology Markers Lab Results  Component Value Date   CALCIUM 9.0 01/07/2016   Coagulation Parameters Lab  Results  Component Value Date   PLT 209 01/07/2016   Cardiovascular Markers Lab Results  Component Value Date   HGB 14.8 01/07/2016   HCT 43.7 01/07/2016   Note: Lab results reviewed.  [place]  Drug: Mr. Hewitt  reports that he does not use drugs. Alcohol:  reports that he does not drink alcohol. Tobacco:  reports that he has been smoking cigarettes. He has a 30.00 pack-year smoking history. He has never used smokeless tobacco. Medical:  has a past medical history of COPD (chronic obstructive pulmonary disease) ([place]), Coronary artery disease, Diabetes mellitus without complication ([place]), Hypertension, Peptic ulcer, and Sleep apnea. Family: family history includes Diabetes in his father and mother; Stomach cancer in his father.  Past Surgical History:  Procedure Laterality Date  . COLONOSCOPY WITH PROPOFOL N/A 11/09/2016   Procedure: COLONOSCOPY WITH PROPOFOL;  Surgeon: Lollie Sails, MD;  Location: [facility] ENDOSCOPY;  Service: Endoscopy;  Laterality: N/A;  . COLONOSCOPY WITH PROPOFOL N/A 07/06/2017   Procedure: COLONOSCOPY WITH PROPOFOL;  Surgeon: Lollie Sails, MD;  Location: [facility] ENDOSCOPY;  Service: Endoscopy;  Laterality: N/A;  . ESOPHAGOGASTRODUODENOSCOPY (EGD) WITH PROPOFOL N/A 11/09/2016   Procedure: ESOPHAGOGASTRODUODENOSCOPY (EGD) WITH PROPOFOL;  Surgeon: Lollie Sails, MD;  Location: [facility] ENDOSCOPY;  Service: Endoscopy;  Laterality: N/A;  . ESOPHAGOGASTRODUODENOSCOPY (EGD) WITH PROPOFOL N/A 07/06/2017   Procedure: ESOPHAGOGASTRODUODENOSCOPY (EGD) WITH PROPOFOL;  Surgeon: Lollie Sails, MD;  Location: [facility] ENDOSCOPY;  Service: Endoscopy;  Laterality: N/A;   Active Ambulatory Problems    Diagnosis Date Noted  . Cellulitis 01/06/2016  . Essential hypertension, benign 02/11/2016  . Hyperlipidemia 02/11/2016  . Tobacco dependence 02/11/2016  . Obesity 02/11/2016  . Lower limb ulcer, ankle, left, with fat layer exposed ([place]) 02/11/2016  . Lymphedema 02/11/2016   . Barrett's esophagus without dysplasia 03/10/2017  . Colon cancer screening 10/14/2016  . Chronic bilateral low back pain with bilateral sciatica (Primary Area of Pain) (R>L) 10/07/2017  . Chronic pain of both lower extremities (Secondary Area of Pain) (R>L) 10/07/2017  . Chronic pain syndrome 10/07/2017  . Pharmacologic therapy 10/07/2017  . Disorder of skeletal system 10/07/2017  . Problems influencing health status 10/07/2017   Resolved Ambulatory Problems    Diagnosis Date Noted  . No Resolved Ambulatory Problems   Past Medical History:  Diagnosis Date  . COPD (chronic obstructive pulmonary disease) ([place])   . Coronary artery disease   . Diabetes mellitus without complication ([place])   . Hypertension   . Peptic ulcer   . Sleep apnea    Constitutional Exam  General appearance: Well nourished, well developed, and well hydrated. In no apparent acute distress Vitals:   10/07/17 1405  BP: (!) 158/91  Pulse: 95  Resp: 16  Temp: 97.9 F (36.6 C)  TempSrc: Oral  SpO2: 97%  Weight: (!) 326 lb (147.9 kg)  Height: _0  (1.753 m)   BMI Assessment: Estimated body mass index is 48.14 kg/m as calculated from the following:   Height as of this encounter: _1  (1.753 m).   Weight as of this encounter: 326 lb (147.9 kg).  BMI interpretation table: BMI level Category Range association with higher incidence of chronic pain  <18 kg/m2 Underweight   18.5-24.9 kg/m2 Ideal body weight   25-29.9 kg/m2 Overweight Increased incidence by 20%  30-34.9 kg/m2 Obese (Class I) Increased incidence by 68%  35-39.9 kg/m2 Severe obesity (Class II) Increased incidence by 136%  >40 kg/m2 Extreme obesity (Class III) Increased incidence by 254%   BMI Readings from Last 4 Encounters:  10/07/17 48.14 kg/m  07/06/17 46.67 kg/m  03/19/17 46.96 kg/m  01/15/17 44.62 kg/m   Wt Readings from Last 4 Encounters:  10/07/17 (!) 326 lb (147.9 kg)  07/06/17 (!) 316 lb (143.3 kg)  03/19/17 (!) 318 lb  (144.2 kg)  01/15/17 (!) 311 lb (141.1 kg)  Psych/Mental status: Alert, oriented x 3 (person, place, & time)       Eyes: PERLA Respiratory: No evidence of acute respiratory distress  Cervical Spine Exam  Inspection: No masses, redness, or swelling Alignment: Symmetrical Functional ROM: Unrestricted ROM      Stability: No instability detected Muscle strength & Tone: Functionally intact Sensory: Unimpaired Palpation: No palpable anomalies              Upper Extremity (UE) Exam    Side: Right upper extremity  Side: Left upper extremity  Inspection: No masses, redness, swelling, or asymmetry. No contractures  Inspection: No masses, redness, swelling, or asymmetry. No contractures  Functional ROM: Unrestricted ROM          Functional ROM: Unrestricted ROM          Muscle strength & Tone: Functionally intact  Muscle strength & Tone: Functionally intact  Sensory: Unimpaired  Sensory: Unimpaired  Palpation: No palpable anomalies              Palpation: No palpable anomalies              Specialized Test(s): Deferred         Specialized Test(s): Deferred          Thoracic Spine Exam  Inspection: No masses, redness, or swelling Alignment: Symmetrical Functional ROM: Unrestricted ROM Stability: No instability detected Sensory: Unimpaired Muscle strength & Tone: No palpable anomalies  Lumbar Spine Exam  Inspection: No masses, redness, or swelling Alignment: Symmetrical Functional ROM: Adequate ROM      Stability: No instability detected Muscle strength & Tone: Functionally intact Sensory: Unimpaired Palpation: Non-tender       Provocative Tests: Lumbar Hyperextension and rotation test: Positive bilaterally for facet joint pain. Patrick's Maneuver: Negative                    Gait & Posture Assessment  Ambulation: Unassisted Gait: Relatively normal for age and body habitus Posture: WNL   Lower Extremity Exam    Side: Right lower extremity  Side: Left lower extremity  Inspection:  Venous stasis edema with discoloration  Inspection: Venous stasis edema with discoloration  Functional ROM: Unrestricted ROM          Functional ROM: Unrestricted ROM          Muscle strength & Tone: Functionally intact  Muscle strength & Tone: Functionally intact  Sensory: Unimpaired  Sensory: Unimpaired  Palpation: No palpable anomalies  Palpation: No palpable anomalies   Assessment  Primary Diagnosis & Pertinent Problem List: Diagnoses of Chronic bilateral low back pain with bilateral sciatica (Primary Area of Pain) (R>L), Chronic pain of both lower extremities (Secondary Area of Pain) (R>L), Chronic pain syndrome, Pharmacologic therapy, Disorder of skeletal system,  and Problems influencing health status were pertinent to this visit.  Visit Diagnosis: 1. Chronic bilateral low back pain with bilateral sciatica (Primary Area of Pain) (R>L)   2. Chronic pain of both lower extremities (Secondary Area of Pain) (R>L)   3. Chronic pain syndrome   4. Pharmacologic therapy   5. Disorder of skeletal system   6. Problems influencing health status    Plan of Care  Initial treatment plan:  Please be advised that as per protocol, today's visit has been an evaluation only. We have not taken over the patient's controlled substance management.  Problem-specific plan: No problem-specific Assessment & Plan notes found for this encounter.  Ordered Lab-work, Procedure(s), Referral(s), & Consult(s): Orders Placed This Encounter  Procedures  . Compliance Drug Analysis, Ur  . Comp. Metabolic Panel (12)  . Magnesium  . Vitamin B12  . Sedimentation rate  . 25-Hydroxyvitamin D Lcms D2+D3  . C-reactive protein   Pharmacotherapy: Medications ordered:  No orders of the defined types were placed in this encounter.  Medications administered during this visit: [name] had no medications administered during this visit.   Pharmacotherapy under consideration:  Opioid Analgesics: The patient was  informed that there is no guarantee that he would be a candidate for opioid analgesics. The decision will be made following CDC guidelines. This decision will be based on the results of diagnostic studies, as well as Mr. Pellecchia risk profile.  Membrane stabilizer: To be determined at a later time Muscle relaxant: To be determined at a later time NSAID: To be determined at a later time Other analgesic(s): To be determined at a later time   Interventional therapies under consideration: Mr. Markuson was informed that there is no guarantee that he would be a candidate for interventional therapies. The decision will be based on the results of diagnostic studies, as well as Mr. Blew risk profile.  Possible procedure(s): Diagnostic bilateral lumbar epidural steroid injection Diagnostic bilateral lumbar facet nerve block Possible bilateral lumbar facet radiofrequency ablation   Provider-requested follow-up: Return for 2nd Visit, w/ Dr. Dossie Arbour.  Future Appointments  Date Time Provider [place]  11/03/2017 11:30 AM Milinda Pointer, MD [facility] None    Primary Care Physician: Theotis Burrow, MD Location: [facility] Note by:  Date: 10/07/2017; Time: 3:49 PM  Pain Score Disclaimer: We use the NRS-11 scale. This is a self-reported, subjective measurement of pain severity with only modest accuracy. It is used primarily to identify changes within a particular patient. It must be understood that outpatient pain scales are significantly less accurate that those used for research, where they can be applied under ideal controlled circumstances with minimal exposure to variables. In reality, the score is likely to be a combination of pain intensity and pain affect, where pain affect describes the degree of emotional arousal or changes in action readiness caused by the sensory experience of pain. Factors such as social and work situation, setting, emotional  state, anxiety levels, expectation, and prior pain experience may influence pain perception and show large inter-individual differences that may also be affected by time variables.  Patient instructions provided during this appointment: Patient Instructions   ____________________________________________________________________________________________  Appointment Policy Summary  It is our goal and responsibility to provide the medical community with assistance in the evaluation and management of patients with chronic pain. Unfortunately our resources are limited. Because we do not have an unlimited amount of time, or available appointments, we are required to closely monitor and manage  their use. The following rules exist to maximize their use:  Patient's responsibilities: 1. Punctuality:  At what time should I arrive? You should be physically present in our office 30 minutes before your scheduled appointment. Your scheduled appointment is with your assigned healthcare provider. However, it takes 5-10 minutes to be "checked-in", and another 15 minutes for the nurses to do the admission. If you arrive to our office at the time you were given for your appointment, you will end up being at least 20-25 minutes late to your appointment with the provider. 2. Tardiness:  What happens if I arrive only a few minutes after my scheduled appointment time? You will need to reschedule your appointment. The cutoff is your appointment time. This is why it is so important that you arrive at least 30 minutes before that appointment. If you have an appointment scheduled for 10:00 AM and you arrive at 10:01, you will be required to reschedule your appointment.  3. Plan ahead:  Always assume that you will encounter traffic on your way in. Plan for it. If you are dependent on a driver, make sure they understand these rules and the need to arrive early. 4. Other appointments and responsibilities:  Avoid scheduling any  other appointments before or after your pain clinic appointments.  5. Be prepared:  Write down everything that you need to discuss with your healthcare provider and give this information to the admitting nurse. Write down the medications that you will need refilled. Bring your pills and bottles (even the empty ones), to all of your appointments, except for those where a procedure is scheduled. 6. No children or pets:  Find someone to take care of them. It is not appropriate to bring them in. 7. Scheduling changes:  We request "advanced notification" of any changes or cancellations. 8. Advanced notification:  Defined as a time period of more than 24 hours prior to the originally scheduled appointment. This allows for the appointment to be offered to other patients. 9. Rescheduling:  When a visit is rescheduled, it will require the cancellation of the original appointment. For this reason they both fall within the category of "Cancellations".  10. Cancellations:  They require advanced notification. Any cancellation less than 24 hours before the  appointment will be recorded as a "No Show". 11. No Show:  Defined as an unkept appointment where the patient failed to notify or declare to the practice their intention or inability to keep the appointment.  Corrective process for repeat offenders:  1. Tardiness: Three (3) episodes of rescheduling due to late arrivals will be recorded as one (1) "No Show". 2. Cancellation or reschedule: Three (3) cancellations or rescheduling will be recorded as one (1) "No Show". 3. "No Shows": Three (3) "No Shows" within a 12 month period will result in discharge from the practice. ____________________________________________________________________________________________  ____________________________________________________________________________________________  Pain Scale  Introduction: The pain score used by this practice is the Verbal Numerical Rating Scale  (VNRS-11). This is an 11-point scale. It is for adults and children 10 years or older. There are significant differences in how the pain score is reported, used, and applied. Forget everything you learned in the past and learn this scoring system.  General Information: The scale should reflect your current level of pain. Unless you are specifically asked for the level of your worst pain, or your average pain. If you are asked for one of these two, then it should be understood that it is over the past 24 hours.  Basic Activities of Daily Living (ADL): Personal hygiene, dressing, eating, transferring, and using restroom.  Instructions: Most patients tend to report their level of pain as a combination of two factors, their physical pain and their psychosocial pain. This last one is also known as "suffering" and it is reflection of how physical pain affects you socially and psychologically. From now on, report them separately. From this point on, when asked to report your pain level, report only your physical pain. Use the following table for reference.  Pain Clinic Pain Levels (0-5/10)  Pain Level Score  Description  No Pain 0   Mild pain 1 Nagging, annoying, but does not interfere with basic activities of daily living (ADL). Patients are able to eat, bathe, get dressed, toileting (being able to get on and off the toilet and perform personal hygiene functions), transfer (move in and out of bed or a chair without assistance), and maintain continence (able to control bladder and bowel functions). Blood pressure and heart rate are unaffected. A normal heart rate for a healthy adult ranges from 60 to 100 bpm (beats per minute).   Mild to moderate pain 2 Noticeable and distracting. Impossible to hide from other people. More frequent flare-ups. Still possible to adapt and function close to normal. It can be very annoying and may have occasional stronger flare-ups. With discipline, patients may get used to it and  adapt.   Moderate pain 3 Interferes significantly with activities of daily living (ADL). It becomes difficult to feed, bathe, get dressed, get on and off the toilet or to perform personal hygiene functions. Difficult to get in and out of bed or a chair without assistance. Very distracting. With effort, it can be ignored when deeply involved in activities.   Moderately severe pain 4 Impossible to ignore for more than a few minutes. With effort, patients may still be able to manage work or participate in some social activities. Very difficult to concentrate. Signs of autonomic nervous system discharge are evident: dilated pupils (mydriasis); mild sweating (diaphoresis); sleep interference. Heart rate becomes elevated (>115 bpm). Diastolic blood pressure (lower number) rises above 100 mmHg. Patients find relief in laying down and not moving.   Severe pain 5 Intense and extremely unpleasant. Associated with frowning face and frequent crying. Pain overwhelms the senses.  Ability to do any activity or maintain social relationships becomes significantly limited. Conversation becomes difficult. Pacing back and forth is common, as getting into a comfortable position is nearly impossible. Pain wakes you up from deep sleep. Physical signs will be obvious: pupillary dilation; increased sweating; goosebumps; brisk reflexes; cold, clammy hands and feet; nausea, vomiting or dry heaves; loss of appetite; significant sleep disturbance with inability to fall asleep or to remain asleep. When persistent, significant weight loss is observed due to the complete loss of appetite and sleep deprivation.  Blood pressure and heart rate becomes significantly elevated. Caution: If elevated blood pressure triggers a pounding headache associated with blurred vision, then the patient should immediately seek attention at an urgent or emergency care unit, as these may be signs of an impending stroke.    Emergency Department Pain Levels  (6-10/10)  Emergency Room Pain 6 Severely limiting. Requires emergency care and should not be seen or managed at an outpatient pain management facility. Communication becomes difficult and requires great effort. Assistance to reach the emergency department may be required. Facial flushing and profuse sweating along with potentially dangerous increases in heart rate and blood pressure will be evident.  Distressing pain 7 Self-care is very difficult. Assistance is required to transport, or use restroom. Assistance to reach the emergency department will be required. Tasks requiring coordination, such as bathing and getting dressed become very difficult.   Disabling pain 8 Self-care is no longer possible. At this level, pain is disabling. The individual is unable to do even the most "basic" activities such as walking, eating, bathing, dressing, transferring to a bed, or toileting. Fine motor skills are lost. It is difficult to think clearly.   Incapacitating pain 9 Pain becomes incapacitating. Thought processing is no longer possible. Difficult to remember your own name. Control of movement and coordination are lost.   The worst pain imaginable 10 At this level, most patients pass out from pain. When this level is reached, collapse of the autonomic nervous system occurs, leading to a sudden drop in blood pressure and heart rate. This in turn results in a temporary and dramatic drop in blood flow to the brain, leading to a loss of consciousness. Fainting is one of the body's self defense mechanisms. Passing out puts the brain in a calmed state and causes it to shut down for a while, in order to begin the healing process.    Summary: 1. Refer to this scale when providing [place] with your pain level. 2. Be accurate and careful when reporting your pain level. This will help with your care. 3. Over-reporting your pain level will lead to loss of credibility. 4. Even a level of 1/10 means that there is pain and  will be treated at our facility. 5. High, inaccurate reporting will be documented as "Symptom Exaggeration", leading to loss of credibility and suspicions of possible secondary gains such as obtaining more narcotics, or wanting to appear disabled, for fraudulent reasons. 6. Only pain levels of 5 or below will be seen at our facility. 7. Pain levels of 6 and above will be sent to the Emergency Department and the appointment cancelled. ____________________________________________________________________________________________    BMI Assessment: Estimated body mass index is 48.14 kg/m as calculated from the following:   Height as of this encounter: _0  (1.753 m).   Weight as of this encounter: 326 lb (147.9 kg).  BMI interpretation table: BMI level Category Range association with higher incidence of chronic pain  <18 kg/m2 Underweight   18.5-24.9 kg/m2 Ideal body weight   25-29.9 kg/m2 Overweight Increased incidence by 20%  30-34.9 kg/m2 Obese (Class I) Increased incidence by 68%  35-39.9 kg/m2 Severe obesity (Class II) Increased incidence by 136%  >40 kg/m2 Extreme obesity (Class III) Increased incidence by 254%   BMI Readings from Last 4 Encounters:  10/07/17 48.14 kg/m  07/06/17 46.67 kg/m  03/19/17 46.96 kg/m  01/15/17 44.62 kg/m   Wt Readings from Last 4 Encounters:  10/07/17 (!) 326 lb (147.9 kg)  07/06/17 (!) 316 lb (143.3 kg)  03/19/17 (!) 318 lb (144.2 kg)  01/15/17 (!) 311 lb (141.1 kg)

## 2017-10-07 NOTE — Patient Instructions (Addendum)
____________________________________________________________________________________________  Appointment Policy Summary  It is our goal and responsibility to provide the medical community with assistance in the evaluation and management of patients with chronic pain. Unfortunately our resources are limited. Because we do not have an unlimited amount of time, or available appointments, we are required to closely monitor and manage their use. The following rules exist to maximize their use:  Patient's responsibilities: 1. Punctuality:  At what time should I arrive? You should be physically present in our office 30 minutes before your scheduled appointment. Your scheduled appointment is with your assigned healthcare provider. However, it takes 5-10 minutes to be "checked-in", and another 15 minutes for the nurses to do the admission. If you arrive to our office at the time you were given for your appointment, you will end up being at least 20-25 minutes late to your appointment with the provider. 2. Tardiness:  What happens if I arrive only a few minutes after my scheduled appointment time? You will need to reschedule your appointment. The cutoff is your appointment time. This is why it is so important that you arrive at least 30 minutes before that appointment. If you have an appointment scheduled for 10:00 AM and you arrive at 10:01, you will be required to reschedule your appointment.  3. Plan ahead:  Always assume that you will encounter traffic on your way in. Plan for it. If you are dependent on a driver, make sure they understand these rules and the need to arrive early. 4. Other appointments and responsibilities:  Avoid scheduling any other appointments before or after your pain clinic appointments.  5. Be prepared:  Write down everything that you need to discuss with your healthcare provider and give this information to the admitting nurse. Write down the medications that you will need  refilled. Bring your pills and bottles (even the empty ones), to all of your appointments, except for those where a procedure is scheduled. 6. No children or pets:  Find someone to take care of them. It is not appropriate to bring them in. 7. Scheduling changes:  We request "advanced notification" of any changes or cancellations. 8. Advanced notification:  Defined as a time period of more than 24 hours prior to the originally scheduled appointment. This allows for the appointment to be offered to other patients. 9. Rescheduling:  When a visit is rescheduled, it will require the cancellation of the original appointment. For this reason they both fall within the category of "Cancellations".  10. Cancellations:  They require advanced notification. Any cancellation less than 24 hours before the  appointment will be recorded as a "No Show". 11. No Show:  Defined as an unkept appointment where the patient failed to notify or declare to the practice their intention or inability to keep the appointment.  Corrective process for repeat offenders:  1. Tardiness: Three (3) episodes of rescheduling due to late arrivals will be recorded as one (1) "No Show". 2. Cancellation or reschedule: Three (3) cancellations or rescheduling will be recorded as one (1) "No Show". 3. "No Shows": Three (3) "No Shows" within a 12 month period will result in discharge from the practice. ____________________________________________________________________________________________  ____________________________________________________________________________________________  Pain Scale  Introduction: The pain score used by this practice is the Verbal Numerical Rating Scale (VNRS-11). This is an 11-point scale. It is for adults and children 10 years or older. There are significant differences in how the pain score is reported, used, and applied. Forget everything you learned in the past and learn  this scoring system.  General  Information: The scale should reflect your current level of pain. Unless you are specifically asked for the level of your worst pain, or your average pain. If you are asked for one of these two, then it should be understood that it is over the past 24 hours.  Basic Activities of Daily Living (ADL): Personal hygiene, dressing, eating, transferring, and using restroom.  Instructions: Most patients tend to report their level of pain as a combination of two factors, their physical pain and their psychosocial pain. This last one is also known as "suffering" and it is reflection of how physical pain affects you socially and psychologically. From now on, report them separately. From this point on, when asked to report your pain level, report only your physical pain. Use the following table for reference.  Pain Clinic Pain Levels (0-5/10)  Pain Level Score  Description  No Pain 0   Mild pain 1 Nagging, annoying, but does not interfere with basic activities of daily living (ADL). Patients are able to eat, bathe, get dressed, toileting (being able to get on and off the toilet and perform personal hygiene functions), transfer (move in and out of bed or a chair without assistance), and maintain continence (able to control bladder and bowel functions). Blood pressure and heart rate are unaffected. A normal heart rate for a healthy adult ranges from 60 to 100 bpm (beats per minute).   Mild to moderate pain 2 Noticeable and distracting. Impossible to hide from other people. More frequent flare-ups. Still possible to adapt and function close to normal. It can be very annoying and may have occasional stronger flare-ups. With discipline, patients may get used to it and adapt.   Moderate pain 3 Interferes significantly with activities of daily living (ADL). It becomes difficult to feed, bathe, get dressed, get on and off the toilet or to perform personal hygiene functions. Difficult to get in and out of bed or a chair  without assistance. Very distracting. With effort, it can be ignored when deeply involved in activities.   Moderately severe pain 4 Impossible to ignore for more than a few minutes. With effort, patients may still be able to manage work or participate in some social activities. Very difficult to concentrate. Signs of autonomic nervous system discharge are evident: dilated pupils (mydriasis); mild sweating (diaphoresis); sleep interference. Heart rate becomes elevated (>115 bpm). Diastolic blood pressure (lower number) rises above 100 mmHg. Patients find relief in laying down and not moving.   Severe pain 5 Intense and extremely unpleasant. Associated with frowning face and frequent crying. Pain overwhelms the senses.  Ability to do any activity or maintain social relationships becomes significantly limited. Conversation becomes difficult. Pacing back and forth is common, as getting into a comfortable position is nearly impossible. Pain wakes you up from deep sleep. Physical signs will be obvious: pupillary dilation; increased sweating; goosebumps; brisk reflexes; cold, clammy hands and feet; nausea, vomiting or dry heaves; loss of appetite; significant sleep disturbance with inability to fall asleep or to remain asleep. When persistent, significant weight loss is observed due to the complete loss of appetite and sleep deprivation.  Blood pressure and heart rate becomes significantly elevated. Caution: If elevated blood pressure triggers a pounding headache associated with blurred vision, then the patient should immediately seek attention at an urgent or emergency care unit, as these may be signs of an impending stroke.    Emergency Department Pain Levels (6-10/10)  Emergency Room Pain 6 Severely   limiting. Requires emergency care and should not be seen or managed at an outpatient pain management facility. Communication becomes difficult and requires great effort. Assistance to reach the emergency department  may be required. Facial flushing and profuse sweating along with potentially dangerous increases in heart rate and blood pressure will be evident.   Distressing pain 7 Self-care is very difficult. Assistance is required to transport, or use restroom. Assistance to reach the emergency department will be required. Tasks requiring coordination, such as bathing and getting dressed become very difficult.   Disabling pain 8 Self-care is no longer possible. At this level, pain is disabling. The individual is unable to do even the most "basic" activities such as walking, eating, bathing, dressing, transferring to a bed, or toileting. Fine motor skills are lost. It is difficult to think clearly.   Incapacitating pain 9 Pain becomes incapacitating. Thought processing is no longer possible. Difficult to remember your own name. Control of movement and coordination are lost.   The worst pain imaginable 10 At this level, most patients pass out from pain. When this level is reached, collapse of the autonomic nervous system occurs, leading to a sudden drop in blood pressure and heart rate. This in turn results in a temporary and dramatic drop in blood flow to the brain, leading to a loss of consciousness. Fainting is one of the body's self defense mechanisms. Passing out puts the brain in a calmed state and causes it to shut down for a while, in order to begin the healing process.    Summary: 1. Refer to this scale when providing [place] with your pain level. 2. Be accurate and careful when reporting your pain level. This will help with your care. 3. Over-reporting your pain level will lead to loss of credibility. 4. Even a level of 1/10 means that there is pain and will be treated at our facility. 5. High, inaccurate reporting will be documented as "Symptom Exaggeration", leading to loss of credibility and suspicions of possible secondary gains such as obtaining more narcotics, or wanting to appear disabled, for  fraudulent reasons. 6. Only pain levels of 5 or below will be seen at our facility. 7. Pain levels of 6 and above will be sent to the Emergency Department and the appointment cancelled. ____________________________________________________________________________________________    BMI Assessment: Estimated body mass index is 48.14 kg/m as calculated from the following:   Height as of this encounter: 5\' 9"  (1.753 m).   Weight as of this encounter: 326 lb (147.9 kg).  BMI interpretation table: BMI level Category Range association with higher incidence of chronic pain  <18 kg/m2 Underweight   18.5-24.9 kg/m2 Ideal body weight   25-29.9 kg/m2 Overweight Increased incidence by 20%  30-34.9 kg/m2 Obese (Class I) Increased incidence by 68%  35-39.9 kg/m2 Severe obesity (Class II) Increased incidence by 136%  >40 kg/m2 Extreme obesity (Class III) Increased incidence by 254%   BMI Readings from Last 4 Encounters:  10/07/17 48.14 kg/m  07/06/17 46.67 kg/m  03/19/17 46.96 kg/m  01/15/17 44.62 kg/m   Wt Readings from Last 4 Encounters:  10/07/17 (!) 326 lb (147.9 kg)  07/06/17 (!) 316 lb (143.3 kg)  03/19/17 (!) 318 lb (144.2 kg)  01/15/17 (!) 311 lb (141.1 kg)

## 2017-10-07 NOTE — Progress Notes (Signed)
Safety precautions to be maintained throughout the outpatient stay will include: orient to surroundings, keep bed in low position, maintain call bell within reach at all times, provide assistance with transfer out of bed and ambulation.  

## 2017-10-11 LAB — COMP. METABOLIC PANEL (12)
A/G RATIO: 1.6 (ref 1.2–2.2)
AST: 18 IU/L (ref 0–40)
Albumin: 4.1 g/dL (ref 3.5–5.5)
Alkaline Phosphatase: 94 IU/L (ref 39–117)
BUN/Creatinine Ratio: 9 (ref 9–20)
BUN: 10 mg/dL (ref 6–24)
Bilirubin Total: 0.3 mg/dL (ref 0.0–1.2)
CALCIUM: 9.9 mg/dL (ref 8.7–10.2)
CHLORIDE: 98 mmol/L (ref 96–106)
CREATININE: 1.1 mg/dL (ref 0.76–1.27)
GFR, EST AFRICAN AMERICAN: 89 mL/min/{1.73_m2} (ref >59)
GFR, EST NON AFRICAN AMERICAN: 77 mL/min/{1.73_m2} (ref >59)
GLUCOSE: 80 mg/dL (ref 65–99)
Globulin, Total: 2.5 g/dL (ref 1.5–4.5)
Potassium: 4.4 mmol/L (ref 3.5–5.2)
Sodium: 140 mmol/L (ref 134–144)
Total Protein: 6.6 g/dL (ref 6.0–8.5)

## 2017-10-11 LAB — SEDIMENTATION RATE: SED RATE: 23 mm/h (ref 0–30)

## 2017-10-11 LAB — 25-HYDROXYVITAMIN D LCMS D2+D3: 25-HYDROXY, VITAMIN D: 29 ng/mL — AB

## 2017-10-11 LAB — MAGNESIUM: Magnesium: 1.7 mg/dL (ref 1.6–2.3)

## 2017-10-11 LAB — 25-HYDROXY VITAMIN D LCMS D2+D3: 25-Hydroxy, Vitamin D-3: 29 ng/mL

## 2017-10-11 LAB — C-REACTIVE PROTEIN: CRP: 5 mg/L (ref 0–10)

## 2017-10-11 LAB — VITAMIN B12: Vitamin B-12: 466 pg/mL (ref 232–1245)

## 2017-10-13 LAB — COMPLIANCE DRUG ANALYSIS, UR

## 2017-10-15 ENCOUNTER — Encounter: Payer: Self-pay | Admitting: Emergency Medicine

## 2017-10-15 ENCOUNTER — Emergency Department
Admission: EM | Admit: 2017-10-15 | Discharge: 2017-10-18 | Disposition: A | Discharge status: Home / Self Care | Payer: Medicaid Other | Attending: Emergency Medicine | Admitting: Emergency Medicine

## 2017-10-15 ENCOUNTER — Other Ambulatory Visit: Payer: Self-pay

## 2017-10-15 DIAGNOSIS — F4325 Adjustment disorder with mixed disturbance of emotions and conduct: Secondary | ICD-10-CM | POA: Diagnosis not present

## 2017-10-15 DIAGNOSIS — L03115 Cellulitis of right lower limb: Secondary | ICD-10-CM | POA: Insufficient documentation

## 2017-10-15 DIAGNOSIS — J449 Chronic obstructive pulmonary disease, unspecified: Secondary | ICD-10-CM | POA: Diagnosis not present

## 2017-10-15 DIAGNOSIS — R45851 Suicidal ideations: Secondary | ICD-10-CM | POA: Diagnosis not present

## 2017-10-15 DIAGNOSIS — Z79899 Other long term (current) drug therapy: Secondary | ICD-10-CM | POA: Insufficient documentation

## 2017-10-15 DIAGNOSIS — E119 Type 2 diabetes mellitus without complications: Secondary | ICD-10-CM | POA: Insufficient documentation

## 2017-10-15 DIAGNOSIS — I10 Essential (primary) hypertension: Secondary | ICD-10-CM | POA: Diagnosis not present

## 2017-10-15 DIAGNOSIS — F329 Major depressive disorder, single episode, unspecified: Secondary | ICD-10-CM | POA: Diagnosis present

## 2017-10-15 DIAGNOSIS — F17210 Nicotine dependence, cigarettes, uncomplicated: Secondary | ICD-10-CM | POA: Insufficient documentation

## 2017-10-15 DIAGNOSIS — I2510 Atherosclerotic heart disease of native coronary artery without angina pectoris: Secondary | ICD-10-CM | POA: Diagnosis not present

## 2017-10-15 DIAGNOSIS — G894 Chronic pain syndrome: Secondary | ICD-10-CM | POA: Diagnosis present

## 2017-10-15 DIAGNOSIS — F4321 Adjustment disorder with depressed mood: Secondary | ICD-10-CM

## 2017-10-15 LAB — CBC
HCT: 43.1 % (ref 40.0–52.0)
HEMOGLOBIN: 14.6 g/dL (ref 13.0–18.0)
MCH: 28.9 pg (ref 26.0–34.0)
MCHC: 33.8 g/dL (ref 32.0–36.0)
MCV: 85.6 fL (ref 80.0–100.0)
Platelets: 200 10*3/uL (ref 150–440)
RBC: 5.04 MIL/uL (ref 4.40–5.90)
RDW: 14.1 % (ref 11.5–14.5)
WBC: 8.7 10*3/uL (ref 3.8–10.6)

## 2017-10-15 LAB — COMPREHENSIVE METABOLIC PANEL
ALBUMIN: 4 g/dL (ref 3.5–5.0)
ALK PHOS: 85 U/L (ref 38–126)
ALT: 23 U/L (ref 0–44)
ANION GAP: 8 (ref 5–15)
AST: 21 U/L (ref 15–41)
BUN: 12 mg/dL (ref 6–20)
CALCIUM: 9.2 mg/dL (ref 8.9–10.3)
CO2: 28 mmol/L (ref 22–32)
Chloride: 105 mmol/L (ref 98–111)
Creatinine, Ser: 1.04 mg/dL (ref 0.61–1.24)
GFR calc Af Amer: >60 mL/min (ref >60)
GFR calc non Af Amer: >60 mL/min (ref >60)
GLUCOSE: 100 mg/dL — AB (ref 70–99)
POTASSIUM: 4.3 mmol/L (ref 3.5–5.1)
SODIUM: 141 mmol/L (ref 135–145)
Total Bilirubin: 0.6 mg/dL (ref 0.3–1.2)
Total Protein: 7.2 g/dL (ref 6.5–8.1)

## 2017-10-15 LAB — ETHANOL: Alcohol, Ethyl (B): <10 mg/dL (ref <10)

## 2017-10-15 LAB — ACETAMINOPHEN LEVEL

## 2017-10-15 LAB — SALICYLATE LEVEL: Salicylate Lvl: <7 mg/dL (ref 2.8–30.0)

## 2017-10-15 MED ORDER — BUPROPION HCL ER (SR) 100 MG PO TB12
200.0000 mg | ORAL_TABLET | Freq: Two times a day (BID) | ORAL | Status: DC
  Administered 2017-10-16 – 2017-10-18 (×6): 200 mg via ORAL
  Filled 2017-10-15 (×8): qty 2

## 2017-10-15 MED ORDER — ALBUTEROL SULFATE HFA 108 (90 BASE) MCG/ACT IN AERS
2.0000 | INHALATION_SPRAY | Freq: Four times a day (QID) | RESPIRATORY_TRACT | Status: DC | PRN
  Administered 2017-10-15 – 2017-10-17 (×3): 2 via RESPIRATORY_TRACT
  Filled 2017-10-15 (×2): qty 6.7

## 2017-10-15 MED ORDER — CEPHALEXIN 500 MG PO CAPS
500.0000 mg | ORAL_CAPSULE | Freq: Two times a day (BID) | ORAL | Status: DC
  Administered 2017-10-15 – 2017-10-18 (×6): 500 mg via ORAL
  Filled 2017-10-15 (×8): qty 1

## 2017-10-15 MED ORDER — TRAZODONE HCL 50 MG PO TABS
50.0000 mg | ORAL_TABLET | Freq: Every evening | ORAL | Status: DC | PRN
  Administered 2017-10-16: 50 mg via ORAL
  Filled 2017-10-15: qty 1

## 2017-10-15 NOTE — ED Notes (Signed)
SOC called and report given.
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
## 2017-10-15 NOTE — ED Notes (Signed)
SOC called back to report, he recomends inpatient services.
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
## 2017-10-15 NOTE — ED Notes (Signed)

## 2017-10-16 LAB — GLUCOSE, CAPILLARY: GLUCOSE-CAPILLARY: 114 mg/dL — AB (ref 70–99)

## 2017-10-16 MED ORDER — IBUPROFEN 600 MG PO TABS
600.0000 mg | ORAL_TABLET | Freq: Three times a day (TID) | ORAL | Status: DC | PRN
  Administered 2017-10-16 (×3): 600 mg via ORAL
  Filled 2017-10-16 (×3): qty 1

## 2017-10-16 MED ORDER — LOSARTAN POTASSIUM 50 MG PO TABS
50.0000 mg | ORAL_TABLET | Freq: Every day | ORAL | Status: DC
  Administered 2017-10-16 – 2017-10-18 (×3): 50 mg via ORAL
  Filled 2017-10-16 (×4): qty 1

## 2017-10-16 MED ORDER — HYDROCHLOROTHIAZIDE 12.5 MG PO CAPS
12.5000 mg | ORAL_CAPSULE | Freq: Every day | ORAL | Status: DC
  Administered 2017-10-16 – 2017-10-18 (×3): 12.5 mg via ORAL
  Filled 2017-10-16 (×4): qty 1

## 2017-10-16 NOTE — ED Provider Notes (Signed)
-----------------------------------------   5:45 AM on 10/16/2017 -----------------------------------------   Blood pressure (!) 176/96, pulse 90, temperature 98.3 F (36.8 C), temperature source Oral, resp. rate 20, height 5\' 9"  (1.753 m), weight (!) 147.3 kg, SpO2 96 %.  The patient had no acute events since last update.  Calm and cooperative at this time.  Disposition is pending Psychiatry     Harvest Dark, MD 10/16/17 [phone]

## 2017-10-16 NOTE — Progress Notes (Signed)
Pt awake in bed watching TV at this time. A & O X4. Denies SI, HI, AVH and pain when assessed "not right now". Presents with depressed affect and mood on initial contact, became more congruent as shift progressed. Pt started on home medications (Cozaar & HCTZ) due to elevated BPs and headaches. Compliant with medications and BP monitoring as shift progressed. Denies adverse drug reactions when assessed. Pt declined shower despite multiple attempts from nursing staff "no, not this moment". Scheduled and PRN medications given per order with verbal education and effects monitored. Emotional support and encouragement offered. Safety checks maintained without self harm gestures or outburst to note thus far.

## 2017-10-16 NOTE — ED Notes (Signed)
Pt given snack tray. 

## 2017-10-16 NOTE — BHH Counselor (Signed)
Spoke with Maudie Mercury at [facility] who indicated Staff would review patient's file for consideration for admission  Spoke with Page at [facility] who requested patient's information as they have open beds  [facility] indicated they have beds open and information was faxed

## 2017-10-16 NOTE — ED Notes (Signed)

## 2017-10-17 MED ORDER — ASPIRIN-ACETAMINOPHEN-CAFFEINE 250-250-65 MG PO TABS
2.0000 | ORAL_TABLET | ORAL | Status: AC
  Administered 2017-10-17: 2 via ORAL
  Filled 2017-10-17 (×2): qty 2

## 2017-10-17 NOTE — ED Notes (Signed)
Hourly rounding reveals patient in room. No complaints, stable, in no acute distress. Q15 minute rounds and monitoring via Security Cameras to continue. 

## 2017-10-17 NOTE — ED Notes (Signed)
Pt's sister Margreta Journey called. She did not have code, so she was advised of privacy rules. Pt was given the code to give her when he called her.

## 2017-10-17 NOTE — ED Notes (Signed)
When asked, pt declined to shower.
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
## 2017-10-17 NOTE — BHH Counselor (Signed)
Patient was denied at [facility] due to medical issues

## 2017-10-17 NOTE — ED Notes (Signed)
Spoke with ED MD regarding patient's preference for Excedrin for HA and reportedly chronic BLE swelling/discoloration. Order received for Excedrin Migraine; will give once it is available. Pt appears in no acute distress at moment.

## 2017-10-17 NOTE — ED Notes (Signed)
Report to include Situation, Background, Assessment, and Recommendations received from [employer]. Patient alert and oriented, warm and dry, in no acute distress. Patient denies SI, HI, AVH and pain. Patient made aware of Q15 minute rounds and security cameras for their safety. Patient instructed to come to me with needs or concerns.

## 2017-10-17 NOTE — ED Provider Notes (Signed)
-----------------------------------------   7:43 AM on 10/17/2017 -----------------------------------------   Blood pressure 125/72, pulse 96, temperature 98 F (36.7 C), temperature source Oral, resp. rate 20, height 5\' 9"  (1.753 m), weight (!) 147.3 kg, SpO2 95 %.  The patient had no acute events since last update.  Calm and cooperative at this time.  Disposition is pending Psychiatry/Behavioral Medicine team recommendations.     Orbie Pyo, MD 10/17/17 [phone]

## 2017-10-18 DIAGNOSIS — F4321 Adjustment disorder with depressed mood: Secondary | ICD-10-CM

## 2017-10-18 DIAGNOSIS — F4325 Adjustment disorder with mixed disturbance of emotions and conduct: Secondary | ICD-10-CM

## 2017-10-18 MED ORDER — METFORMIN HCL 500 MG PO TABS
1000.0000 mg | ORAL_TABLET | Freq: Two times a day (BID) | ORAL | Status: DC
  Administered 2017-10-18: 1000 mg via ORAL
  Filled 2017-10-18: qty 2

## 2017-10-18 MED ORDER — CEPHALEXIN 500 MG PO CAPS: 500.0000 mg | ORAL_CAPSULE | Freq: Two times a day (BID) | ORAL | 0 refills | Status: DC

## 2017-10-18 NOTE — ED Notes (Signed)
Hourly rounding reveals patient sleeping in room. No complaints, stable, in no acute distress. Q15 minute rounds and monitoring via Security Cameras to continue. 

## 2017-10-18 NOTE — ED Provider Notes (Signed)
Cleared for discharge by Dr. Bethena Midget, MD 10/18/17 1450

## 2017-10-18 NOTE — ED Provider Notes (Signed)
-----------------------------------------   6:51 AM on 10/18/2017 -----------------------------------------   Blood pressure (!) 159/68, pulse 93, temperature 98.9 F (37.2 C), temperature source Oral, resp. rate 18, height 5\' 9"  (1.753 m), weight (!) 147.3 kg, SpO2 96 %.  The patient had no acute events since last update.  Calm and cooperative at this time.  Disposition is pending Psychiatry/Behavioral Medicine team recommendations.     Paulette Blanch, MD 10/18/17 [phone]
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
## 2017-10-18 NOTE — ED Notes (Signed)
Hourly rounding reveals patient in room. No complaints, stable, in no acute distress. Q15 minute rounds and monitoring via Security Cameras to continue. 

## 2017-10-18 NOTE — Consult Note (Signed)
[facility] Consult   Reason for Consult: Consult for this 52 year old man who came into the emergency room at the beginning of the weekend after being referred by [facility] because of reported suicidal and homicidal thought Referring Physician: Corky Downs Patient Identification: [name] MRN:  [id number] Principal Diagnosis: Adjustment disorder with mixed disturbance of emotions and conduct Diagnosis:   Patient Active Problem List   Diagnosis Date Noted  . Adjustment disorder with mixed disturbance of emotions and conduct [F43.25] 10/18/2017  . Grief [F43.21] 10/18/2017  . Chronic bilateral low back pain with bilateral sciatica (Primary Area of Pain) (R>L) [M54.42, M54.41, G89.29] 10/07/2017  . Chronic pain of both lower extremities (Secondary Area of Pain) (R>L) [id number], M79.605, G89.29] 10/07/2017  . Chronic pain syndrome [G89.4] 10/07/2017  . Pharmacologic therapy [Z79.899] 10/07/2017  . Disorder of skeletal system [M89.9] 10/07/2017  . Problems influencing health status [Z78.9] 10/07/2017  . Barrett's esophagus without dysplasia [K22.70] 03/10/2017  . Colon cancer screening [Z12.11] 10/14/2016  . Essential hypertension, benign [I10] 02/11/2016  . Hyperlipidemia [E78.5] 02/11/2016  . Tobacco dependence [F17.200] 02/11/2016  . Obesity [E66.9] 02/11/2016  . Lower limb ulcer, ankle, left, with fat layer exposed ([place]) [L97.322] 02/11/2016  . Lymphedema [I89.0] 02/11/2016  . Cellulitis [L03.90] 01/06/2016    Total Time spent with patient: 1 hour  Subjective:   [name] is a 52 y.o. male patient admitted with "I am a lot better than when I came in".  HPI: Patient seen chart reviewed.  52 year old man came to the emergency room in the evening of Friday.  He had gone to his primary care doctor for an unrelated need and spontaneously started talking about how upset he was that his nephew had died earlier that week.  He made statements that he wanted to kill and  torture the person who had murdered his nephew and then likely would kill himself.  This of course prompted them to call the authorities who took the patient to [place] where he repeated the same story and then was brought to our emergency room.  Since being here the patient has been calm and cooperative.  Has not shown signs of psychosis.  Has been cooperative with the whole procedure and not aggressive.  I interviewed the patient today and he repeated the story that his nephew had been murdered this past Monday.  It sounds like that is something that really happened.  Apparently the perpetrator is known but has not been located yet.  Patient was feeling angry and frustrated about this.  Admits that he had thoughts about killing but that it was all pretty much fantasy.  Did not have any real capacity or specific plan to do any of that.  Patient statements about suicidality were largely just related to what he thought would be the logical next step after killing someone else.  Also has chronic pain.  On interview today the patient totally denies any suicidal or homicidal ideation.  He says he has calm down since Friday.  He is rational and understands that the police are working on the case and that the perpetrator will be dealt with by the law.  Patient does have some chronic stress from chronic pain and several other medical problems.  Has some sleep difficulties at times.  Denies any psychotic symptoms.  Denies alcohol or drug abuse.  Social history: Lives on his own.  He has family around but says that they have no longer been able to financially support him.  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  He is in the process of applying for disability.  Medical history: Obesity.  Hypertension dyslipidemia episodes of cellulitis related to edema in the lower legs Barrett's esophagus.  Chronic pain syndrome  Substance abuse history: Patient smokes  heavily but says he does not drink and does not use any drugs.  Past Psychiatric History: No past psychiatric history.  Never seen by a psychiatrist or mental health provider.  No history of psychiatric hospitalization.  He tells me that the night that his nephew died he did make a halfhearted attempt to hang himself but it sounds like it was very halfhearted obviously he did not do himself any damage.  No other history of suicide attempts  Risk to Self: Suicidal Ideation: Yes-Currently Present Suicidal Intent: Yes-Currently Present Suicidal Plan?: Yes-Currently Present Specify Current Suicidal Plan: shoot himself or hang himself Access to Means: Yes Specify Access to Suicidal Means: has access to ropes What has been your use of drugs/alcohol within the last 12 months?: denied use How many times?: 2 Other Self Harm Risks: denied Triggers for Past Attempts: Unknown Intentional Self Injurious Behavior: None Risk to Others: Homicidal Ideation: Yes-Currently Present Thoughts of Harm to Others: Yes-Currently Present Comment - Thoughts of Harm to Others: wants to harm nephews killer Current Homicidal Intent: Yes-Currently Present Current Homicidal Plan: Yes-Currently Present Describe Current Homicidal Plan: Torture and kill nephew's killer Access to Homicidal Means: No Identified Victim: Unknown name, nephews killer History of harm to others?: No Assessment of Violence: None Noted Does patient have access to weapons?: Yes (Comment)(Knives) Criminal Charges Pending?: No Does patient have a court date: No Prior Inpatient Therapy: Prior Inpatient Therapy: No Prior Outpatient Therapy: Prior Outpatient Therapy: No Does patient have an ACCT team?: No Does patient have Intensive In-House Services?  : No Does patient have [facility] services? : No Does patient have P4CC services?: No  Past Medical History:  Past Medical History:  Diagnosis Date  . COPD (chronic obstructive pulmonary disease)  ([place])   . Coronary artery disease   . Diabetes mellitus without complication ([place])   . Hypertension   . Peptic ulcer   . Sleep apnea     Past Surgical History:  Procedure Laterality Date  . COLONOSCOPY WITH PROPOFOL N/A 11/09/2016   Procedure: COLONOSCOPY WITH PROPOFOL;  Surgeon: Lollie Sails, MD;  Location: [facility] ENDOSCOPY;  Service: Endoscopy;  Laterality: N/A;  . COLONOSCOPY WITH PROPOFOL N/A 07/06/2017   Procedure: COLONOSCOPY WITH PROPOFOL;  Surgeon: Lollie Sails, MD;  Location: [facility] ENDOSCOPY;  Service: Endoscopy;  Laterality: N/A;  . ESOPHAGOGASTRODUODENOSCOPY (EGD) WITH PROPOFOL N/A 11/09/2016   Procedure: ESOPHAGOGASTRODUODENOSCOPY (EGD) WITH PROPOFOL;  Surgeon: Lollie Sails, MD;  Location: [facility] ENDOSCOPY;  Service: Endoscopy;  Laterality: N/A;  . ESOPHAGOGASTRODUODENOSCOPY (EGD) WITH PROPOFOL N/A 07/06/2017   Procedure: ESOPHAGOGASTRODUODENOSCOPY (EGD) WITH PROPOFOL;  Surgeon: Lollie Sails, MD;  Location: [facility] ENDOSCOPY;  Service: Endoscopy;  Laterality: N/A;   Family History:  Family History  Problem Relation Age of Onset  . Diabetes Mother   . Diabetes Father   . Stomach cancer Father    Family Psychiatric  History: He believes there is probably a family history of some depression and alcohol abuse Social History:  Social History   Substance and Sexual Activity  Alcohol Use No     Social History   Substance and Sexual Activity  Drug  Use No    Social History   Socioeconomic History  . Marital status: Single    Spouse name: Not on file  . Number of children: Not on file  . Years of education: Not on file  . Highest education level: Not on file  Occupational History  . Not on file  Social Needs  . Financial resource strain: Not on file  . Food insecurity:    Worry: Not on file    Inability: Not on file  . Transportation needs:    Medical: Not on file    Non-medical: Not on file  Tobacco Use  . Smoking status: Current Every Day  Smoker    Packs/day: 1.00    Years: 30.00    Pack years: 30.00    Types: Cigarettes  . Smokeless tobacco: Never Used  Substance and Sexual Activity  . Alcohol use: No  . Drug use: No  . Sexual activity: Never  Lifestyle  . Physical activity:    Days per week: Not on file    Minutes per session: Not on file  . Stress: Not on file  Relationships  . Social connections:    Talks on phone: Not on file    Gets together: Not on file    Attends religious service: Not on file    Active member of club or organization: Not on file    Attends meetings of clubs or organizations: Not on file    Relationship status: Not on file  Other Topics Concern  . Not on file  Social History Narrative  . Not on file   Additional Social History:    Allergies:   Allergies  Allergen Reactions  . Shellfish Allergy Swelling  . Codeine Rash  . Lisinopril Cough    Labs: No results found for this or any previous visit (from the past 48 hour(s)).  Current Facility-Administered Medications  Medication Dose Route Frequency Provider Last Rate Last Dose  . albuterol (PROVENTIL HFA;VENTOLIN HFA) 108 (90 Base) MCG/ACT inhaler 2 puff  2 puff Inhalation Q6H PRN Lavonia Drafts, MD   2 puff at 10/17/17 0622  . buPROPion (WELLBUTRIN SR) 12 hr tablet 200 mg  200 mg Oral BID Lavonia Drafts, MD   200 mg at 10/18/17 0903  . cephALEXin (KEFLEX) capsule 500 mg  500 mg Oral Q12H Lavonia Drafts, MD   500 mg at 10/18/17 0902  . hydrochlorothiazide (MICROZIDE) capsule 12.5 mg  12.5 mg Oral Daily Lisa Roca, MD   12.5 mg at 10/18/17 0904  . ibuprofen (ADVIL,MOTRIN) tablet 600 mg  600 mg Oral Q8H PRN Lisa Roca, MD   600 mg at 10/16/17 2146  . losartan (COZAAR) tablet 50 mg  50 mg Oral Daily Lisa Roca, MD   50 mg at 10/18/17 0904  . metFORMIN (GLUCOPHAGE) tablet 1,000 mg  1,000 mg Oral BID WC Lavonia Drafts, MD   1,000 mg at 10/18/17 1120  . traZODone (DESYREL) tablet 50 mg  50 mg Oral QHS PRN Lavonia Drafts, MD    50 mg at 10/16/17 2146   Current Outpatient Medications  Medication Sig Dispense Refill  . albuterol (VENTOLIN HFA) 108 (90 Base) MCG/ACT inhaler Inhale 2 puffs into the lungs every 6 (six) hours as needed for wheezing or shortness of breath.    Marland Kitchen atorvastatin (LIPITOR) 20 MG tablet Take 20 mg by mouth at bedtime.     Marland Kitchen buPROPion (WELLBUTRIN SR) 150 MG 12 hr tablet Take 150 mg by mouth 2 (two) times daily.    Marland Kitchen  Fluticasone-Salmeterol (ADVAIR DISKUS) 100-50 MCG/DOSE AEPB Inhale 1 puff into the lungs 2 (two) times daily.     . hydrochlorothiazide (MICROZIDE) 12.5 MG capsule Take 12.5 mg by mouth daily.   0  . loratadine (CLARITIN) 10 MG tablet Take 10 mg by mouth daily.    . metFORMIN (GLUCOPHAGE) 1000 MG tablet Take 1,000 mg by mouth 2 (two) times daily with a meal.    . oxybutynin (DITROPAN) 5 MG tablet Take 5 mg by mouth every 12 (twelve) hours.    . pantoprazole (PROTONIX) 40 MG tablet Take 40 mg by mouth 2 (two) times daily.     . cephALEXin (KEFLEX) 500 MG capsule Take 1 capsule (500 mg total) by mouth 2 (two) times daily. 14 capsule 0  . furosemide (LASIX) 40 MG tablet Take 1 tablet (40 mg total) by mouth daily. (Patient not taking: Reported on 10/18/2017) 30 tablet 0  . losartan (COZAAR) 50 MG tablet Take 1 tablet (50 mg total) by mouth daily. (Patient not taking: Reported on 10/18/2017) 30 tablet 0    Musculoskeletal: Strength & Muscle Tone: within normal limits Gait & Station: normal Patient leans: N/A  Psychiatric Specialty Exam: Physical Exam  Nursing note and vitals reviewed. Constitutional: He appears well-developed and well-nourished.  HENT:  Head: Normocephalic and atraumatic.  Eyes: Pupils are equal, round, and reactive to light. Conjunctivae are normal.  Neck: Normal range of motion.  Cardiovascular: Regular rhythm and normal heart sounds.  Respiratory: Effort normal. No respiratory distress.  GI: Soft.  Musculoskeletal: Normal range of motion.  Neurological: He is  alert.  Skin: Skin is warm and dry. There is erythema.  Psychiatric: He has a normal mood and affect. His behavior is normal. Judgment and thought content normal.    Review of Systems  Constitutional: Negative.   HENT: Negative.   Eyes: Negative.   Respiratory: Negative.   Cardiovascular: Negative.   Gastrointestinal: Negative.   Musculoskeletal: Positive for falls.  Skin: Negative.   Neurological: Negative.   Psychiatric/Behavioral: Negative for depression, hallucinations, memory loss, substance abuse and suicidal ideas. The patient is not nervous/anxious and does not have insomnia.     Blood pressure (!) 149/90, pulse 95, temperature 98.9 F (37.2 C), temperature source Oral, resp. rate 18, height 5\' 9"  (1.753 m), weight (!) 147.3 kg, SpO2 97 %.Body mass index is 47.96 kg/m.  General Appearance: Disheveled  Eye Contact:  Good  Speech:  Clear and Coherent  Volume:  Normal  Mood:  Euthymic  Affect:  Congruent  Thought Process:  Goal Directed  Orientation:  Full (Time, Place, and Person)  Thought Content:  Logical  Suicidal Thoughts:  No  Homicidal Thoughts:  No  Memory:  Immediate;   Fair Recent;   Fair Remote;   Fair  Judgement:  Fair  Insight:  Fair  Psychomotor Activity:  Normal  Concentration:  Concentration: Fair  Recall:  [employer] of Knowledge:  Fair  Language:  Fair  Akathisia:  No  Handed:  Right  AIMS (if indicated):     Assets:  Desire for Improvement Housing  ADL's:  Intact  Cognition:  WNL  Sleep:        Treatment Plan Summary: Plan 52 year old man who made some statements about killing someone else and killing himself.  He has no major past psychiatric history and he has been calm and appropriate since coming to the emergency room.  Completely denies now any suicidal or homicidal thoughts.  Appears to be completely rational and  able to understand the consequences of his behavior.  Does not meet criteria for major depression.  Not psychotic.  Patient  does not appear to require inpatient hospitalization nor to meet criteria for IV C.  No need for any medication changes.  Discontinue IVC.  He will be given referral information for [facility] and can be released from the emergency room.  Disposition: No evidence of imminent risk to self or others at present.   Patient does not meet criteria for psychiatric inpatient admission. Supportive therapy provided about ongoing stressors.  Alethia Berthold, MD 10/18/2017 3:05 PM

## 2017-10-18 NOTE — ED Notes (Signed)
Pt will be discharged today per psychiatry.

## 2017-10-18 NOTE — ED Notes (Signed)
Pt discharged to lobby to wait for a friend to pick him up. VS stable. Pt denies SI/HI and AVH. All belongings returned to patient (including his wallet,  cell phone and inhaler).  Discharge paperwork and prescritpion reviewed with patient / patient signed for discharge.

## 2017-10-27 ENCOUNTER — Ambulatory Visit: Payer: Medicaid Other | Admitting: Pain Medicine

## 2017-11-02 DIAGNOSIS — M5136 Other intervertebral disc degeneration, lumbar region: Secondary | ICD-10-CM | POA: Insufficient documentation

## 2017-11-02 DIAGNOSIS — E559 Vitamin D deficiency, unspecified: Secondary | ICD-10-CM | POA: Insufficient documentation

## 2017-11-02 DIAGNOSIS — M48061 Spinal stenosis, lumbar region without neurogenic claudication: Secondary | ICD-10-CM | POA: Insufficient documentation

## 2017-11-02 DIAGNOSIS — M47816 Spondylosis without myelopathy or radiculopathy, lumbar region: Secondary | ICD-10-CM | POA: Insufficient documentation

## 2017-11-02 NOTE — Progress Notes (Signed)
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 ([place])   11. Disorder of skeletal system   12. Pharmacologic therapy   13. Problems influencing health status   14. Tobacco dependence   15. Vitamin D insufficiency   16. Neurogenic bladder   17. Spondylosis without myelopathy or radiculopathy, lumbar region   18. Lumbar facet syndrome (Bilateral) (R>L)    Problems updated and reviewed during this visit: Problem  Spondylosis Without Myelopathy Or Radiculopathy, Lumbar Region  Lumbar facet syndrome (Bilateral) (R>L)  Neurogenic Bladder  Dm2 (Diabetes Mellitus, Type 2) ([facility])    Plan of Care  Pharmacotherapy (Medications Ordered): Meds ordered this encounter  Medications  . ergocalciferol (VITAMIN D2) 50000 units capsule    Sig: Take 1 capsule (50,000 Units total) by mouth 2 (two) times a week. X 6 weeks.    Dispense:  12 capsule    Refill:  0    Do not add this medication to the electronic  "Automatic Refill" notification system. Patient may have prescription filled one day early if pharmacy is closed on scheduled refill date.  . Cholecalciferol (VITAMIN D3) 5000 units CAPS    Sig: Take 1 capsule (5,000 Units total) by mouth daily with breakfast. Take along with calcium and magnesium.    Dispense:  30 capsule    Refill:  5    Do not place medication on "Automatic Refill".  May substitute with similar over-the-counter product.  . Magnesium 500 MG CAPS    Sig: Take 1 capsule (500 mg total) by mouth 2 (two) times daily at 8 am and 10 pm.    Dispense:  60 capsule    Refill:  5    Do not place medication on "Automatic Refill".  The patient may use similar over-the-counter product.  . Calcium Carbonate-Vit D-Min (GNP CALCIUM 1200) 1200-1000 MG-UNIT CHEW    Sig: Chew 1,200 mg by mouth daily with breakfast. Take in combination with vitamin D and magnesium.    Dispense:  30 tablet    Refill:  5    Do not place medication on "Automatic Refill".  May substitute with similar over-the-counter product.    Procedure Orders     LUMBAR FACET(MEDIAL BRANCH NERVE BLOCK) [facility] Lab Orders  No laboratory test(s) ordered today   Imaging Orders  No imaging studies ordered today   Referral Orders  No referral(s) requested today    Pharmacological management options:  Opioid Analgesics: I will not be prescribing any opioids at this time Membrane stabilizer: We have discussed the possibility of optimizing this mode of therapy, if tolerated Muscle relaxant: We have discussed the possibility of a trial NSAID: We have discussed the possibility of a trial Other analgesic(s): To  be determined at a later time   Interventional management options: Planned, scheduled, and/or pending:    Diagnostic bilateral lumbar facet block #1 under fluoroscopic guidance and IV sedation   Considering:   Diagnostic bilateral lumbar epidural steroid injection  Diagnostic bilateral lumbar facet nerve block   Possible bilateral lumbar facet RFA    PRN Procedures:   None at this time   Provider-requested follow-up: Return for Procedure (w/ sedation): (B) L-FCT BLK #1.  No future appointments.  Primary Care Physician: Theotis Burrow, MD Location: [facility] Note by: Gaspar Cola, MD Date: 11/03/2017; Time: 12:42 PM

## 2017-11-03 ENCOUNTER — Other Ambulatory Visit: Payer: Self-pay

## 2017-11-03 ENCOUNTER — Ambulatory Visit: Payer: Medicaid Other | Attending: Pain Medicine | Admitting: Pain Medicine

## 2017-11-03 ENCOUNTER — Encounter: Payer: Self-pay | Admitting: Pain Medicine

## 2017-11-03 VITALS — BP 165/102 | HR 105 | Temp 98.1°F | Ht 69.0 in | Wt 316.0 lb

## 2017-11-03 DIAGNOSIS — F329 Major depressive disorder, single episode, unspecified: Secondary | ICD-10-CM | POA: Insufficient documentation

## 2017-11-03 DIAGNOSIS — M5442 Lumbago with sciatica, left side: Secondary | ICD-10-CM | POA: Diagnosis not present

## 2017-11-03 DIAGNOSIS — Z789 Other specified health status: Secondary | ICD-10-CM

## 2017-11-03 DIAGNOSIS — M5126 Other intervertebral disc displacement, lumbar region: Secondary | ICD-10-CM | POA: Diagnosis not present

## 2017-11-03 DIAGNOSIS — I2510 Atherosclerotic heart disease of native coronary artery without angina pectoris: Secondary | ICD-10-CM | POA: Insufficient documentation

## 2017-11-03 DIAGNOSIS — N319 Neuromuscular dysfunction of bladder, unspecified: Secondary | ICD-10-CM | POA: Diagnosis not present

## 2017-11-03 DIAGNOSIS — M5136 Other intervertebral disc degeneration, lumbar region: Secondary | ICD-10-CM | POA: Diagnosis not present

## 2017-11-03 DIAGNOSIS — K2270 Barrett's esophagus without dysplasia: Secondary | ICD-10-CM | POA: Insufficient documentation

## 2017-11-03 DIAGNOSIS — E669 Obesity, unspecified: Secondary | ICD-10-CM | POA: Diagnosis not present

## 2017-11-03 DIAGNOSIS — E785 Hyperlipidemia, unspecified: Secondary | ICD-10-CM | POA: Insufficient documentation

## 2017-11-03 DIAGNOSIS — G4730 Sleep apnea, unspecified: Secondary | ICD-10-CM | POA: Insufficient documentation

## 2017-11-03 DIAGNOSIS — F4320 Adjustment disorder, unspecified: Secondary | ICD-10-CM | POA: Insufficient documentation

## 2017-11-03 DIAGNOSIS — M79605 Pain in left leg: Secondary | ICD-10-CM | POA: Diagnosis not present

## 2017-11-03 DIAGNOSIS — M549 Dorsalgia, unspecified: Secondary | ICD-10-CM | POA: Diagnosis present

## 2017-11-03 DIAGNOSIS — M48061 Spinal stenosis, lumbar region without neurogenic claudication: Secondary | ICD-10-CM

## 2017-11-03 DIAGNOSIS — Z79899 Other long term (current) drug therapy: Secondary | ICD-10-CM | POA: Insufficient documentation

## 2017-11-03 DIAGNOSIS — L97322 Non-pressure chronic ulcer of left ankle with fat layer exposed: Secondary | ICD-10-CM

## 2017-11-03 DIAGNOSIS — I890 Lymphedema, not elsewhere classified: Secondary | ICD-10-CM | POA: Insufficient documentation

## 2017-11-03 DIAGNOSIS — Z1211 Encounter for screening for malignant neoplasm of colon: Secondary | ICD-10-CM | POA: Insufficient documentation

## 2017-11-03 DIAGNOSIS — M79604 Pain in right leg: Secondary | ICD-10-CM | POA: Diagnosis not present

## 2017-11-03 DIAGNOSIS — M47816 Spondylosis without myelopathy or radiculopathy, lumbar region: Secondary | ICD-10-CM | POA: Diagnosis not present

## 2017-11-03 DIAGNOSIS — E559 Vitamin D deficiency, unspecified: Secondary | ICD-10-CM | POA: Insufficient documentation

## 2017-11-03 DIAGNOSIS — I10 Essential (primary) hypertension: Secondary | ICD-10-CM | POA: Insufficient documentation

## 2017-11-03 DIAGNOSIS — M899 Disorder of bone, unspecified: Secondary | ICD-10-CM

## 2017-11-03 DIAGNOSIS — M5127 Other intervertebral disc displacement, lumbosacral region: Secondary | ICD-10-CM | POA: Insufficient documentation

## 2017-11-03 DIAGNOSIS — E11621 Type 2 diabetes mellitus with foot ulcer: Secondary | ICD-10-CM | POA: Diagnosis not present

## 2017-11-03 DIAGNOSIS — F17210 Nicotine dependence, cigarettes, uncomplicated: Secondary | ICD-10-CM | POA: Insufficient documentation

## 2017-11-03 DIAGNOSIS — Z8711 Personal history of peptic ulcer disease: Secondary | ICD-10-CM | POA: Insufficient documentation

## 2017-11-03 DIAGNOSIS — G8929 Other chronic pain: Secondary | ICD-10-CM

## 2017-11-03 DIAGNOSIS — J449 Chronic obstructive pulmonary disease, unspecified: Secondary | ICD-10-CM | POA: Insufficient documentation

## 2017-11-03 DIAGNOSIS — F17200 Nicotine dependence, unspecified, uncomplicated: Secondary | ICD-10-CM

## 2017-11-03 DIAGNOSIS — E119 Type 2 diabetes mellitus without complications: Secondary | ICD-10-CM | POA: Insufficient documentation

## 2017-11-03 DIAGNOSIS — Z7984 Long term (current) use of oral hypoglycemic drugs: Secondary | ICD-10-CM | POA: Insufficient documentation

## 2017-11-03 DIAGNOSIS — M5441 Lumbago with sciatica, right side: Secondary | ICD-10-CM | POA: Insufficient documentation

## 2017-11-03 DIAGNOSIS — G894 Chronic pain syndrome: Secondary | ICD-10-CM | POA: Diagnosis not present

## 2017-11-03 DIAGNOSIS — Z888 Allergy status to other drugs, medicaments and biological substances status: Secondary | ICD-10-CM | POA: Insufficient documentation

## 2017-11-03 DIAGNOSIS — Z885 Allergy status to narcotic agent status: Secondary | ICD-10-CM | POA: Insufficient documentation

## 2017-11-03 MED ORDER — ERGOCALCIFEROL 1.25 MG (50000 UT) PO CAPS: 50000.0000 [IU] | ORAL_CAPSULE | ORAL | 0 refills | Status: AC

## 2017-11-03 MED ORDER — VITAMIN D3 125 MCG (5000 UT) PO CAPS: 1.0000 | ORAL_CAPSULE | Freq: Every day | ORAL | 5 refills | Status: AC

## 2017-11-03 MED ORDER — GNP CALCIUM 1200 1200-1000 MG-UNIT PO CHEW: 1200.0000 mg | CHEWABLE_TABLET | Freq: Every day | ORAL | 5 refills | Status: DC

## 2017-11-03 MED ORDER — MAGNESIUM 500 MG PO CAPS: 500.0000 mg | ORAL_CAPSULE | Freq: Two times a day (BID) | ORAL | 5 refills | Status: DC

## 2017-11-03 NOTE — Patient Instructions (Addendum)
____________________________________________________________________________________________  Pain Scale  Introduction: The pain score used by this practice is the Verbal Numerical Rating Scale (VNRS-11). This is an 11-point scale. It is for adults and children 10 years or older. There are significant differences in how the pain score is reported, used, and applied. Forget everything you learned in the past and learn this scoring system.  General Information: The scale should reflect your current level of pain. Unless you are specifically asked for the level of your worst pain, or your average pain. If you are asked for one of these two, then it should be understood that it is over the past 24 hours.  Basic Activities of Daily Living (ADL): Personal hygiene, dressing, eating, transferring, and using restroom.  Instructions: Most patients tend to report their level of pain as a combination of two factors, their physical pain and their psychosocial pain. This last one is also known as "suffering" and it is reflection of how physical pain affects you socially and psychologically. From now on, report them separately. From this point on, when asked to report your pain level, report only your physical pain. Use the following table for reference.  Pain Clinic Pain Levels (0-5/10)  Pain Level Score  Description  No Pain 0   Mild pain 1 Nagging, annoying, but does not interfere with basic activities of daily living (ADL). Patients are able to eat, bathe, get dressed, toileting (being able to get on and off the toilet and perform personal hygiene functions), transfer (move in and out of bed or a chair without assistance), and maintain continence (able to control bladder and bowel functions). Blood pressure and heart rate are unaffected. A normal heart rate for a healthy adult ranges from 60 to 100 bpm (beats per minute).   Mild to moderate pain 2 Noticeable and distracting. Impossible to hide from other  people. More frequent flare-ups. Still possible to adapt and function close to normal. It can be very annoying and may have occasional stronger flare-ups. With discipline, patients may get used to it and adapt.   Moderate pain 3 Interferes significantly with activities of daily living (ADL). It becomes difficult to feed, bathe, get dressed, get on and off the toilet or to perform personal hygiene functions. Difficult to get in and out of bed or a chair without assistance. Very distracting. With effort, it can be ignored when deeply involved in activities.   Moderately severe pain 4 Impossible to ignore for more than a few minutes. With effort, patients may still be able to manage work or participate in some social activities. Very difficult to concentrate. Signs of autonomic nervous system discharge are evident: dilated pupils (mydriasis); mild sweating (diaphoresis); sleep interference. Heart rate becomes elevated (>115 bpm). Diastolic blood pressure (lower number) rises above 100 mmHg. Patients find relief in laying down and not moving.   Severe pain 5 Intense and extremely unpleasant. Associated with frowning face and frequent crying. Pain overwhelms the senses.  Ability to do any activity or maintain social relationships becomes significantly limited. Conversation becomes difficult. Pacing back and forth is common, as getting into a comfortable position is nearly impossible. Pain wakes you up from deep sleep. Physical signs will be obvious: pupillary dilation; increased sweating; goosebumps; brisk reflexes; cold, clammy hands and feet; nausea, vomiting or dry heaves; loss of appetite; significant sleep disturbance with inability to fall asleep or to remain asleep. When persistent, significant weight loss is observed due to the complete loss of appetite and sleep deprivation.  Blood   pressure and heart rate becomes significantly elevated. Caution: If elevated blood pressure triggers a pounding headache  associated with blurred vision, then the patient should immediately seek attention at an urgent or emergency care unit, as these may be signs of an impending stroke.    Emergency Department Pain Levels (6-10/10)  Emergency Room Pain 6 Severely limiting. Requires emergency care and should not be seen or managed at an outpatient pain management facility. Communication becomes difficult and requires great effort. Assistance to reach the emergency department may be required. Facial flushing and profuse sweating along with potentially dangerous increases in heart rate and blood pressure will be evident.   Distressing pain 7 Self-care is very difficult. Assistance is required to transport, or use restroom. Assistance to reach the emergency department will be required. Tasks requiring coordination, such as bathing and getting dressed become very difficult.   Disabling pain 8 Self-care is no longer possible. At this level, pain is disabling. The individual is unable to do even the most "basic" activities such as walking, eating, bathing, dressing, transferring to a bed, or toileting. Fine motor skills are lost. It is difficult to think clearly.   Incapacitating pain 9 Pain becomes incapacitating. Thought processing is no longer possible. Difficult to remember your own name. Control of movement and coordination are lost.   The worst pain imaginable 10 At this level, most patients pass out from pain. When this level is reached, collapse of the autonomic nervous system occurs, leading to a sudden drop in blood pressure and heart rate. This in turn results in a temporary and dramatic drop in blood flow to the brain, leading to a loss of consciousness. Fainting is one of the body's self defense mechanisms. Passing out puts the brain in a calmed state and causes it to shut down for a while, in order to begin the healing process.    Summary: 1. Refer to this scale when providing us with your pain level. 2. Be  accurate and careful when reporting your pain level. This will help with your care. 3. Over-reporting your pain level will lead to loss of credibility. 4. Even a level of 1/10 means that there is pain and will be treated at our facility. 5. High, inaccurate reporting will be documented as "Symptom Exaggeration", leading to loss of credibility and suspicions of possible secondary gains such as obtaining more narcotics, or wanting to appear disabled, for fraudulent reasons. 6. Only pain levels of 5 or below will be seen at our facility. 7. Pain levels of 6 and above will be sent to the Emergency Department and the appointment cancelled. ____________________________________________________________________________________________   ____________________________________________________________________________________________  Preparing for Procedure with Sedation  Instructions: . Oral Intake: Do not eat or drink anything for at least 8 hours prior to your procedure. . Transportation: Public transportation is not allowed. Bring an adult driver. The driver must be physically present in our waiting room before any procedure can be started. . Physical Assistance: Bring an adult physically capable of assisting you, in the event you need help. This adult should keep you company at home for at least 6 hours after the procedure. . Blood Pressure Medicine: Take your blood pressure medicine with a sip of water the morning of the procedure. . Blood thinners: Notify our staff if you are taking any blood thinners. Depending on which one you take, there will be specific instructions on how and when to stop it. . Diabetics on insulin: Notify the staff so that you can be   scheduled 1st case in the morning. If your diabetes requires high dose insulin, take only  of your normal insulin dose the morning of the procedure and notify the staff that you have done so. . Preventing infections: Shower with an antibacterial soap  the morning of your procedure. . Build-up your immune system: Take 1000 mg of Vitamin C with every meal (3 times a day) the day prior to your procedure. Marland Kitchen Antibiotics: Inform the staff if you have a condition or reason that requires you to take antibiotics before dental procedures. . Pregnancy: If you are pregnant, call and cancel the procedure. . Sickness: If you have a cold, fever, or any active infections, call and cancel the procedure. . Arrival: You must be in the facility at least 30 minutes prior to your scheduled procedure. . Children: Do not bring children with you. . Dress appropriately: Bring dark clothing that you would not mind if they get stained. . Valuables: Do not bring any jewelry or valuables.  Procedure appointments are reserved for interventional treatments only. Marland Kitchen No Prescription Refills. . No medication changes will be discussed during procedure appointments. . No disability issues will be discussed.  Reasons to call and reschedule or cancel your procedure: (Following these recommendations will minimize the risk of a serious complication.) . Surgeries: Avoid having procedures within 2 weeks of any surgery. (Avoid for 2 weeks before or after any surgery). . Flu Shots: Avoid having procedures within 2 weeks of a flu shots or . (Avoid for 2 weeks before or after immunizations). . Barium: Avoid having a procedure within 7-10 days after having had a radiological study involving the use of radiological contrast. (Myelograms, Barium swallow or enema study). . Heart attacks: Avoid any elective procedures or surgeries for the initial 6 months after a "Myocardial Infarction" (Heart Attack). . Blood thinners: It is imperative that you stop these medications before procedures. Let us know if you if you take any blood thinner.  . Infection: Avoid procedures during or within two weeks of an infection (including chest colds or gastrointestinal problems). Symptoms associated with  infections include: Localized redness, fever, chills, night sweats or profuse sweating, burning sensation when voiding, cough, congestion, stuffiness, runny nose, sore throat, diarrhea, nausea, vomiting, cold or Flu symptoms, recent or current infections. It is specially important if the infection is over the area that we intend to treat. Marland Kitchen Heart and lung problems: Symptoms that may suggest an active cardiopulmonary problem include: cough, chest pain, breathing difficulties or shortness of breath, dizziness, ankle swelling, uncontrolled high or unusually low blood pressure, and/or palpitations. If you are experiencing any of these symptoms, cancel your procedure and contact your primary care physician for an evaluation.  Remember:  Regular Business hours are:  Monday to Thursday 8:00 AM to 4:00 PM  Provider's Schedule: Milinda Pointer, MD:  Procedure days: Tuesday and Thursday 7:30 AM to 4:00 PM  Gillis Santa, MD:  Procedure days: Monday and Wednesday 7:30 AM to 4:00 PM ____________________________________________________________________________________________   ____________________________________________________________________________________________  Weight Management Required  URGENT: Your weight has been found to be adversely affecting your health.  Dear [name]:  Your current Body mass index is 46.67 kg/m.Marland Kitchen Estimated body mass index is 46.67 kg/m as calculated from the following:   Height as of this encounter: 5\' 9"  (1.753 m).   Weight as of this encounter: 316 lb (143.3 kg).  Your last four (4) weight and BMI calculations are as follows: Wt Readings from Last 4 Encounters:  11/03/17 Marland Kitchen)  316 lb (143.3 kg)  10/15/17 (!) 324 lb 11.8 oz (147.3 kg)  10/07/17 (!) 326 lb (147.9 kg)  07/06/17 (!) 316 lb (143.3 kg)   BMI Readings from Last 4 Encounters:  11/03/17 46.67 kg/m  10/15/17 47.96 kg/m  10/07/17 48.14 kg/m  07/06/17 46.67 kg/m    Calculations estimate  your ideal body weight to be: Ideal body weight: 70.7 kg (155 lb 13.8 oz) Adjusted ideal body weight: 99.8 kg (219 lb 14.7 oz)  Please use the table below to identify your weight category and associated incidence of chronic pain, secondary to your weight.  BMI interpretation table: BMI level Category Associated incidence of chronic pain  <18 kg/m2 Underweight   18.5-24.9 kg/m2 Ideal body weight   25-29.9 kg/m2 Overweight  20%  30-34.9 kg/m2 Obese (Class I)  68%  35-39.9 kg/m2 Severe obesity (Class II)  136%  >40 kg/m2 Extreme obesity (Class III)  254%   In addition: You will be considered "Morbidly Obese", if your BMI is above 30 and you have one or more of the following conditions that are directly associated with obesity: 1. Diabetes 2. Asthma 3. High blood pressure 4. Acid reflux  5. Sleep apnea 6. Low back pain (Lumbar Facet Syndrome) 7. Hip pain (Osteoarthritis of hip) 8. Knee pain (Osteoarthritis of knee) 9. Certain types of cancer  Recommendation: At this point it is urgent that you take a step back and concentrate in loosing weight. Because most chronic pain patients do have difficulty exercising secondary to their pain, you must rely on proper nutrition and dieting in order to lose the weight. If your BMI is above 40, you should seriously consider bariatric surgery. A realistic goal is to lose 10% of your body weight over a period of 12 months.  If over time you have unsuccessfully try to lose weight, then it is time for you to seek professional help and to enter a medically supervised weight management program.  Pain management considerations:  1. Pharmacological Problems: Be advised that the use of opioid analgesics has been associated with decreased metabolism and weight gain.  For this reason, should we see that you are unable to lose weight while taking these medications, it may become necessary for [place] to taper down and indefinitely discontinue these medicines.   2. Technical Problems: The incidence of successful interventional therapies decreases as the patient's BMI increases. It is much more difficult to accomplish a safe and effective interventional therapy on a patient with a BMI above 35. Yours is Body mass index is 46.67 kg/m.Marland Kitchen 3. Radiation Exposure Problems: The x-rays machine, used to accomplish injection therapies, will automatically increase their x-ray output in order to capture an appropriate bone image. This means that radiation exposure increases exponentially with the patient's BMI. (The higher the BMI, the higher the radiation exposure.) Although the level of radiation used at a given time is still safe to the patient, it is not for the physician and/or assisting staff. Unfortunately, radiation exposure is accumulative. Because physicians and the staff have to do procedures and be exposed on a daily basis, this can result in health problems such as cancer and radiation burns. Radiation exposure to the staff is monitored by the radiation batches that they wear. The exposure levels are reported back to the staff on a quarterly basis. Depending on levels of exposure, physicians and staff may be obligated by law to decrease this exposure. This means that they have the right and obligation to refuse providing therapies where  they may be overexposed to radiation. For this reason, physicians may decline to offer therapies such as radiofrequency ablation or implants to patients with a BMI above 40. ____________________________________________________________________________________________

## 2017-11-04 ENCOUNTER — Telehealth: Payer: Self-pay | Admitting: *Deleted

## 2017-11-04 NOTE — Telephone Encounter (Signed)
Clarified prescription with pharmacist.  Switched softgel with chewable.

## 2017-11-18 ENCOUNTER — Ambulatory Visit: Payer: Medicaid Other | Admitting: Pain Medicine

## 2017-11-18 ENCOUNTER — Ambulatory Visit
Admission: RE | Admit: 2017-11-18 | Discharge: 2017-11-18 | Disposition: A | Discharge status: Home / Self Care | Payer: Medicaid Other | Source: Ambulatory Visit | Attending: Pain Medicine | Admitting: Pain Medicine

## 2017-11-18 ENCOUNTER — Other Ambulatory Visit: Payer: Self-pay

## 2017-11-18 VITALS — BP 140/68 | HR 92 | Temp 97.8°F | Resp 14 | Ht 69.0 in | Wt 316.0 lb

## 2017-11-18 DIAGNOSIS — M5442 Lumbago with sciatica, left side: Secondary | ICD-10-CM | POA: Insufficient documentation

## 2017-11-18 DIAGNOSIS — M5441 Lumbago with sciatica, right side: Secondary | ICD-10-CM | POA: Diagnosis present

## 2017-11-18 DIAGNOSIS — G8929 Other chronic pain: Secondary | ICD-10-CM | POA: Diagnosis present

## 2017-11-18 DIAGNOSIS — M8938 Hypertrophy of bone, other site: Secondary | ICD-10-CM | POA: Diagnosis not present

## 2017-11-18 DIAGNOSIS — Z7984 Long term (current) use of oral hypoglycemic drugs: Secondary | ICD-10-CM | POA: Diagnosis not present

## 2017-11-18 DIAGNOSIS — Z888 Allergy status to other drugs, medicaments and biological substances status: Secondary | ICD-10-CM | POA: Insufficient documentation

## 2017-11-18 DIAGNOSIS — M5136 Other intervertebral disc degeneration, lumbar region: Secondary | ICD-10-CM | POA: Diagnosis not present

## 2017-11-18 DIAGNOSIS — Z79899 Other long term (current) drug therapy: Secondary | ICD-10-CM | POA: Insufficient documentation

## 2017-11-18 DIAGNOSIS — M47816 Spondylosis without myelopathy or radiculopathy, lumbar region: Secondary | ICD-10-CM

## 2017-11-18 MED ORDER — LIDOCAINE HCL 2 % IJ SOLN
20.0000 mL | Freq: Once | INTRAMUSCULAR | Status: AC
  Administered 2017-11-18: 400 mg
  Filled 2017-11-18: qty 40

## 2017-11-18 MED ORDER — MIDAZOLAM HCL 5 MG/5ML IJ SOLN
1.0000 mg | INTRAMUSCULAR | Status: DC | PRN
  Administered 2017-11-18: 3 mg via INTRAVENOUS
  Filled 2017-11-18: qty 5

## 2017-11-18 MED ORDER — FENTANYL CITRATE (PF) 100 MCG/2ML IJ SOLN
25.0000 ug | INTRAMUSCULAR | Status: DC | PRN
  Administered 2017-11-18: 50 ug via INTRAVENOUS
  Filled 2017-11-18: qty 2

## 2017-11-18 MED ORDER — LACTATED RINGERS IV SOLN
1000.0000 mL | Freq: Once | INTRAVENOUS | Status: AC
  Administered 2017-11-18: 1000 mL via INTRAVENOUS

## 2017-11-18 MED ORDER — TRIAMCINOLONE ACETONIDE 40 MG/ML IJ SUSP
80.0000 mg | Freq: Once | INTRAMUSCULAR | Status: AC
  Administered 2017-11-18: 80 mg
  Filled 2017-11-18: qty 2

## 2017-11-18 MED ORDER — ROPIVACAINE HCL 2 MG/ML IJ SOLN
18.0000 mL | Freq: Once | INTRAMUSCULAR | Status: AC
  Administered 2017-11-18: 18 mL via PERINEURAL
  Filled 2017-11-18: qty 20

## 2017-11-18 NOTE — Patient Instructions (Signed)

## 2017-11-18 NOTE — Progress Notes (Signed)
Patient's Name: [name]  MRN: [id number]  Referring Provider: Theotis Burrow*  DOB: 02/21/1965  PCP: Theotis Burrow, MD  DOS: 11/18/2017  Note by: Gaspar Cola, MD  Service setting: Ambulatory outpatient  Specialty: Interventional Pain Management  Patient type: Established  Location: [facility] (AMB) Pain Management Facility  Visit type: Interventional Procedure   Primary Reason for Visit: Interventional Pain Management Treatment. CC: Back Pain (lower)  Procedure:          Anesthesia, Analgesia, Anxiolysis:  Type: Lumbar Facet, Medial Branch Block(s) #1  Primary Purpose: Diagnostic Region: Posterolateral Lumbosacral Spine Level: L2, L3, L4, L5, & S1 Medial Branch Level(s). Injecting these levels blocks the L3-4, L4-5, and L5-S1 lumbar facet joints. Laterality: Bilateral  Type: Moderate (Conscious) Sedation combined with Local Anesthesia Indication(s): Analgesia and Anxiety Route: Intravenous (IV) IV Access: Secured Sedation: Meaningful verbal contact was maintained at all times during the procedure  Local Anesthetic: Lidocaine 1-2%  Position: Prone   Indications: 1. Spondylosis without myelopathy or radiculopathy, lumbar region   2. Lumbar facet syndrome (Bilateral) (R>L)   3. Lumbar facet hypertrophy (Bilateral)   4. DDD (degenerative disc disease), lumbar   5. Chronic low back pain (Primary Area of Pain) (Bilateral) (R>L) w/ sciatica (Bilateral)    Pain Score: Pre-procedure: 4 /10 Post-procedure: 0-No pain/10  Pre-op Assessment:  [name] is a 52 y.o. (year old), male patient, seen today for interventional treatment. He  has a past surgical history that includes Esophagogastroduodenoscopy (egd) with propofol (N/A, 11/09/2016); Colonoscopy with propofol (N/A, 11/09/2016); Colonoscopy with propofol (N/A, 07/06/2017); and Esophagogastroduodenoscopy (egd) with propofol (N/A, 07/06/2017). [name] has a current medication list which includes the following  prescription(s): albuterol, atorvastatin, bupropion, gnp calcium 1200, vitamin d3, ergocalciferol, fluticasone-salmeterol, furosemide, hydrochlorothiazide, loratadine, losartan, magnesium, metformin, oxybutynin, and pantoprazole, and the following Facility-Administered Medications: fentanyl and midazolam. His primarily concern today is the Back Pain (lower)  Initial Vital Signs:  Pulse/HCG Rate: 93ECG Heart Rate: 93 Temp: 98.5 F (36.9 C) Resp: 20 BP: (!) 148/92 SpO2: 97 %  BMI: Estimated body mass index is 46.67 kg/m as calculated from the following:   Height as of this encounter: 5\' 9"  (1.753 m).   Weight as of this encounter: 316 lb (143.3 kg).  Risk Assessment: Allergies: Reviewed. He is allergic to shellfish allergy; codeine; and lisinopril.  Allergy Precautions: None required Coagulopathies: Reviewed. None identified.  Blood-thinner therapy: None at this time Active Infection(s): Reviewed. None identified. [name] is afebrile  Site Confirmation: [name] was asked to confirm the procedure and laterality before marking the site Procedure checklist: Completed Consent: Before the procedure and under the influence of no sedative(s), amnesic(s), or anxiolytics, the patient was informed of the treatment options, risks and possible complications. To fulfill our ethical and legal obligations, as recommended by the American Medical Association's Code of Ethics, I have informed the patient of my clinical impression; the nature and purpose of the treatment or procedure; the risks, benefits, and possible complications of the intervention; the alternatives, including doing nothing; the risk(s) and benefit(s) of the alternative treatment(s) or procedure(s); and the risk(s) and benefit(s) of doing nothing. The patient was provided information about the general risks and possible complications associated with the procedure. These may include, but are not limited to: failure to achieve desired  goals, infection, bleeding, organ or nerve damage, allergic reactions, paralysis, and death. In addition, the patient was informed of those risks and complications associated to Spine-related procedures, such as failure to decrease  pain; infection (i.e.: Meningitis, epidural or intraspinal abscess); bleeding (i.e.: epidural hematoma, subarachnoid hemorrhage, or any other type of intraspinal or peri-dural bleeding); organ or nerve damage (i.e.: Any type of peripheral nerve, nerve root, or spinal cord injury) with subsequent damage to sensory, motor, and/or autonomic systems, resulting in permanent pain, numbness, and/or weakness of one or several areas of the body; allergic reactions; (i.e.: anaphylactic reaction); and/or death. Furthermore, the patient was informed of those risks and complications associated with the medications. These include, but are not limited to: allergic reactions (i.e.: anaphylactic or anaphylactoid reaction(s)); adrenal axis suppression; blood sugar elevation that in diabetics may result in ketoacidosis or comma; water retention that in patients with history of congestive heart failure may result in shortness of breath, pulmonary edema, and decompensation with resultant heart failure; weight gain; swelling or edema; medication-induced neural toxicity; particulate matter embolism and blood vessel occlusion with resultant organ, and/or nervous system infarction; and/or aseptic necrosis of one or more joints. Finally, the patient was informed that Medicine is not an exact science; therefore, there is also the possibility of unforeseen or unpredictable risks and/or possible complications that may result in a catastrophic outcome. The patient indicated having understood very clearly. We have given the patient no guarantees and we have made no promises. Enough time was given to the patient to ask questions, all of which were answered to the patient's satisfaction. [name] has indicated that  he wanted to continue with the procedure. Attestation: I, the ordering provider, attest that I have discussed with the patient the benefits, risks, side-effects, alternatives, likelihood of achieving goals, and potential problems during recovery for the procedure that I have provided informed consent. Date  Time: 11/18/2017 12:29 PM  Pre-Procedure Preparation:  Monitoring: As per clinic protocol. Respiration, ETCO2, SpO2, BP, heart rate and rhythm monitor placed and checked for adequate function Safety Precautions: Patient was assessed for positional comfort and pressure points before starting the procedure. Time-out: I initiated and conducted the "Time-out" before starting the procedure, as per protocol. The patient was asked to participate by confirming the accuracy of the "Time Out" information. Verification of the correct person, site, and procedure were performed and confirmed by me, the nursing staff, and the patient. "Time-out" conducted as per Joint Commission's Universal Protocol (UP.01.01.01). Time: 1305  Description of Procedure:          Laterality: Bilateral. The procedure was performed in identical fashion on both sides. Levels:  L2, L3, L4, L5, & S1 Medial Branch Level(s) Area Prepped: Posterior Lumbosacral Region Prepping solution: ChloraPrep (2% chlorhexidine gluconate and 70% isopropyl alcohol) Safety Precautions: Aspiration looking for blood return was conducted prior to all injections. At no point did we inject any substances, as a needle was being advanced. Before injecting, the patient was told to immediately notify me if he was experiencing any new onset of "ringing in the ears, or metallic taste in the mouth". No attempts were made at seeking any paresthesias. Safe injection practices and needle disposal techniques used. Medications properly checked for expiration dates. SDV (single dose vial) medications used. After the completion of the procedure, all disposable equipment  used was discarded in the proper designated medical waste containers. Local Anesthesia: Protocol guidelines were followed. The patient was positioned over the fluoroscopy table. The area was prepped in the usual manner. The time-out was completed. The target area was identified using fluoroscopy. A 12-in long, straight, sterile hemostat was used with fluoroscopic guidance to locate the targets for each level blocked.  Once located, the skin was marked with an approved surgical skin marker. Once all sites were marked, the skin (epidermis, dermis, and hypodermis), as well as deeper tissues (fat, connective tissue and muscle) were infiltrated with a small amount of a short-acting local anesthetic, loaded on a 10cc syringe with a 25G, 1.5-in  Needle. An appropriate amount of time was allowed for local anesthetics to take effect before proceeding to the next step. Local Anesthetic: Lidocaine 2.0% The unused portion of the local anesthetic was discarded in the proper designated containers. Technical explanation of process:  L2 Medial Branch Nerve Block (MBB): The target area for the L2 medial branch is at the junction of the postero-lateral aspect of the superior articular process and the superior, posterior, and medial edge of the transverse process of L3. Under fluoroscopic guidance, a Quincke needle was inserted until contact was made with os over the superior postero-lateral aspect of the pedicular shadow (target area). After negative aspiration for blood, 0.5 mL of the nerve block solution was injected without difficulty or complication. The needle was removed intact. L3 Medial Branch Nerve Block (MBB): The target area for the L3 medial branch is at the junction of the postero-lateral aspect of the superior articular process and the superior, posterior, and medial edge of the transverse process of L4. Under fluoroscopic guidance, a Quincke needle was inserted until contact was made with os over the superior  postero-lateral aspect of the pedicular shadow (target area). After negative aspiration for blood, 0.5 mL of the nerve block solution was injected without difficulty or complication. The needle was removed intact. L4 Medial Branch Nerve Block (MBB): The target area for the L4 medial branch is at the junction of the postero-lateral aspect of the superior articular process and the superior, posterior, and medial edge of the transverse process of L5. Under fluoroscopic guidance, a Quincke needle was inserted until contact was made with os over the superior postero-lateral aspect of the pedicular shadow (target area). After negative aspiration for blood, 0.5 mL of the nerve block solution was injected without difficulty or complication. The needle was removed intact. L5 Medial Branch Nerve Block (MBB): The target area for the L5 medial branch is at the junction of the postero-lateral aspect of the superior articular process and the superior, posterior, and medial edge of the sacral ala. Under fluoroscopic guidance, a Quincke needle was inserted until contact was made with os over the superior postero-lateral aspect of the pedicular shadow (target area). After negative aspiration for blood, 0.5 mL of the nerve block solution was injected without difficulty or complication. The needle was removed intact. S1 Medial Branch Nerve Block (MBB): The target area for the S1 medial branch is at the posterior and inferior 6 o'clock position of the L5-S1 facet joint. Under fluoroscopic guidance, the Quincke needle inserted for the L5 MBB was redirected until contact was made with os over the inferior and postero aspect of the sacrum, at the 6 o' clock position under the L5-S1 facet joint (Target area). After negative aspiration for blood, 0.5 mL of the nerve block solution was injected without difficulty or complication. The needle was removed intact. Procedural Needles: 22-gauge, 3.5-inch, Quincke needles used for all  levels. Nerve block solution: 0.2% PF-Ropivacaine + Triamcinolone (40 mg/mL) diluted to a final concentration of 4 mg of Triamcinolone/mL of Ropivacaine The unused portion of the solution was discarded in the proper designated containers.  Once the entire procedure was completed, the treated area was cleaned, making sure to  leave some of the prepping solution back to take advantage of its long term bactericidal properties.   Illustration of the posterior view of the lumbar spine and the posterior neural structures. Laminae of L2 through S1 are labeled. DPRL5, dorsal primary ramus of L5; DPRS1, dorsal primary ramus of S1; DPR3, dorsal primary ramus of L3; FJ, facet (zygapophyseal) joint L3-L4; I, inferior articular process of L4; LB1, lateral branch of dorsal primary ramus of L1; IAB, inferior articular branches from L3 medial branch (supplies L4-L5 facet joint); IBP, intermediate branch plexus; MB3, medial branch of dorsal primary ramus of L3; NR3, third lumbar nerve root; S, superior articular process of L5; SAB, superior articular branches from L4 (supplies L4-5 facet joint also); TP3, transverse process of L3.  Vitals:   11/18/17 1320 11/18/17 1327 11/18/17 1338 11/18/17 1348  BP: 136/80 135/81 (!) 151/92 140/68  Pulse: 92     Resp: 16 18 14 14   Temp:  98.8 F (37.1 C)  97.8 F (36.6 C)  TempSrc:  Temporal  Temporal  SpO2: 98% 97% 99% 99%  Weight:      Height:         Start Time: 1305 hrs. End Time: 1319 hrs.  Imaging Guidance (Spinal):          Type of Imaging Technique: Fluoroscopy Guidance (Spinal) Indication(s): Assistance in needle guidance and placement for procedures requiring needle placement in or near specific anatomical locations not easily accessible without such assistance. Exposure Time: Please see nurses notes. Contrast: None used. Fluoroscopic Guidance: I was personally present during the use of fluoroscopy. "Tunnel Vision Technique" used to obtain the best possible  view of the target area. Parallax error corrected before commencing the procedure. "Direction-depth-direction" technique used to introduce the needle under continuous pulsed fluoroscopy. Once target was reached, antero-posterior, oblique, and lateral fluoroscopic projection used confirm needle placement in all planes. Images permanently stored in EMR. Interpretation: No contrast injected. I personally interpreted the imaging intraoperatively. Adequate needle placement confirmed in multiple planes. Permanent images saved into the patient's record.  Antibiotic Prophylaxis:   Anti-infectives (From admission, onward)   None     Indication(s): None identified  Post-operative Assessment:  Post-procedure Vital Signs:  Pulse/HCG Rate: 9294 Temp: 97.8 F (36.6 C) Resp: 14 BP: 140/68 SpO2: 99 %  EBL: None  Complications: No immediate post-treatment complications observed by team, or reported by patient.  Note: The patient tolerated the entire procedure well. A repeat set of vitals were taken after the procedure and the patient was kept under observation following institutional policy, for this type of procedure. Post-procedural neurological assessment was performed, showing return to baseline, prior to discharge. The patient was provided with post-procedure discharge instructions, including a section on how to identify potential problems. Should any problems arise concerning this procedure, the patient was given instructions to immediately contact us, at any time, without hesitation. In any case, we plan to contact the patient by telephone for a follow-up status report regarding this interventional procedure.  Comments:  No additional relevant information.  Plan of Care    Imaging Orders     DG C-Arm 1-60 Min-No Report  Procedure Orders     LUMBAR FACET(MEDIAL BRANCH NERVE BLOCK) MBNB  Medications ordered for procedure: Meds ordered this encounter  Medications  . lidocaine (XYLOCAINE) 2  % (with pres) injection 400 mg  . midazolam (VERSED) 5 MG/5ML injection 1-2 mg    Make sure Flumazenil is available in the pyxis when using this medication. If oversedation  occurs, administer 0.2 mg IV over 15 sec. If after 45 sec no response, administer 0.2 mg again over 1 min; may repeat at 1 min intervals; not to exceed 4 doses (1 mg)  . fentaNYL (SUBLIMAZE) injection 25-50 mcg    Make sure Narcan is available in the pyxis when using this medication. In the event of respiratory depression (RR< 8/min): Titrate NARCAN (naloxone) in increments of 0.1 to 0.2 mg IV at 2-3 minute intervals, until desired degree of reversal.  . lactated ringers infusion 1,000 mL  . ropivacaine (PF) 2 mg/mL (0.2%) (NAROPIN) injection 18 mL  . triamcinolone acetonide (KENALOG-40) injection 80 mg   Medications administered: We administered lidocaine, midazolam, fentaNYL, lactated ringers, ropivacaine (PF) 2 mg/mL (0.2%), and triamcinolone acetonide.  See the medical record for exact dosing, route, and time of administration.  Disposition: Discharge home  Discharge Date & Time: 11/18/2017; 1348 hrs.   Physician-requested Follow-up: Return for post-procedure eval (2 wks), w/ Dr. Dossie Arbour.  Future Appointments  Date Time Provider [place]  12/06/2017  1:00 PM Milinda Pointer, MD [facility] None   Primary Care Physician: Theotis Burrow, MD Location: [facility] Note by: Gaspar Cola, MD Date: 11/18/2017; Time: 3:29 PM  Disclaimer:  Medicine is not an Chief Strategy Officer. The only guarantee in medicine is that nothing is guaranteed. It is important to note that the decision to proceed with this intervention was based on the information collected from the patient. The Data and conclusions were drawn from the patient's questionnaire, the interview, and the physical examination. Because the information was provided in large part by the patient, it cannot be guaranteed that  it has not been purposely or unconsciously manipulated. Every effort has been made to obtain as much relevant data as possible for this evaluation. It is important to note that the conclusions that lead to this procedure are derived in large part from the available data. Always take into account that the treatment will also be dependent on availability of resources and existing treatment guidelines, considered by other Pain Management Practitioners as being common knowledge and practice, at the time of the intervention. For Medico-Legal purposes, it is also important to point out that variation in procedural techniques and pharmacological choices are the acceptable norm. The indications, contraindications, technique, and results of the above procedure should only be interpreted and judged by a Board-Certified Interventional Pain Specialist with extensive familiarity and expertise in the same exact procedure and technique.

## 2017-11-18 NOTE — Progress Notes (Signed)
Safety precautions to be maintained throughout the outpatient stay will include: orient to surroundings, keep bed in low position, maintain call bell within reach at all times, provide assistance with transfer out of bed and ambulation.  

## 2017-11-19 ENCOUNTER — Telehealth: Payer: Self-pay

## 2017-11-19 NOTE — Telephone Encounter (Signed)
Post procedure phone call. Patient states he is doing well.  

## 2017-11-26 ENCOUNTER — Ambulatory Visit (INDEPENDENT_AMBULATORY_CARE_PROVIDER_SITE_OTHER): Payer: Medicaid Other | Admitting: Vascular Surgery

## 2017-11-26 ENCOUNTER — Encounter (INDEPENDENT_AMBULATORY_CARE_PROVIDER_SITE_OTHER): Payer: Self-pay | Admitting: Vascular Surgery

## 2017-11-26 VITALS — BP 167/102 | HR 93 | Resp 19 | Ht 70.5 in | Wt 313.0 lb

## 2017-11-26 DIAGNOSIS — L97221 Non-pressure chronic ulcer of left calf limited to breakdown of skin: Secondary | ICD-10-CM | POA: Insufficient documentation

## 2017-11-26 DIAGNOSIS — R600 Localized edema: Secondary | ICD-10-CM

## 2017-11-26 DIAGNOSIS — I10 Essential (primary) hypertension: Secondary | ICD-10-CM

## 2017-11-26 DIAGNOSIS — F17210 Nicotine dependence, cigarettes, uncomplicated: Secondary | ICD-10-CM

## 2017-11-26 DIAGNOSIS — E119 Type 2 diabetes mellitus without complications: Secondary | ICD-10-CM

## 2017-11-26 DIAGNOSIS — Z6841 Body Mass Index (BMI) 40.0 and over, adult: Secondary | ICD-10-CM

## 2017-11-26 DIAGNOSIS — L97211 Non-pressure chronic ulcer of right calf limited to breakdown of skin: Secondary | ICD-10-CM | POA: Diagnosis not present

## 2017-11-26 DIAGNOSIS — M7989 Other specified soft tissue disorders: Secondary | ICD-10-CM | POA: Insufficient documentation

## 2017-11-26 NOTE — Assessment & Plan Note (Signed)
A 3 layer [place] boot was placed on the right lower extremity today.  This will be changed weekly.

## 2017-11-26 NOTE — Patient Instructions (Signed)
Edema Edema is when you have too much fluid in your body or under your skin. Edema may make your legs, feet, and ankles swell up. Swelling is also common in looser tissues, like around your eyes. This is a common condition. It gets more common as you get older. There are many possible causes of edema. Eating too much salt (sodium) and being on your feet or sitting for a long time can cause edema in your legs, feet, and ankles. Hot weather may make edema worse. Edema is usually painless. Your skin may look swollen or shiny. Follow these instructions at home:  Keep the swollen body part raised (elevated) above the level of your heart when you are sitting or lying down.  Do not sit still or stand for a long time.  Do not wear tight clothes. Do not wear garters on your upper legs.  Exercise your legs. This can help the swelling go down.  Wear elastic bandages or support stockings as told by your doctor.  Eat a low-salt (low-sodium) diet to reduce fluid as told by your doctor.  Depending on the cause of your swelling, you may need to limit how much fluid you drink (fluid restriction).  Take over-the-counter and prescription medicines only as told by your doctor. Contact a doctor if:  Treatment is not working.  You have heart, liver, or kidney disease and have symptoms of edema.  You have sudden and unexplained weight gain. Get help right away if:  You have shortness of breath or chest pain.  You cannot breathe when you lie down.  You have pain, redness, or warmth in the swollen areas.  You have heart, liver, or kidney disease and get edema all of a sudden.  You have a fever and your symptoms get worse all of a sudden. Summary  Edema is when you have too much fluid in your body or under your skin.  Edema may make your legs, feet, and ankles swell up. Swelling is also common in looser tissues, like around your eyes.  Raise (elevate) the swollen body part above the level of your  heart when you are sitting or lying down.  Follow your doctor's instructions about diet and how much fluid you can drink (fluid restriction). This information is not intended to replace advice given to you by your health care provider. Make sure you discuss any questions you have with your health care provider. Document Released: 06/17/2007 Document Revised: 01/17/2016 Document Reviewed: 01/17/2016 [employer] Interactive Patient Education  2017 [employer]  

## 2017-11-26 NOTE — Progress Notes (Signed)
MRN : [id number]  [name] is a 52 y.o. (December 17, 1965) male who presents with chief complaint of  Chief Complaint  Patient presents with  . Follow-up    Bilateral Leg swelling   .  History of Present Illness: Patient returns today in follow up of for worsening symptoms of leg swelling and now ulceration and weeping on the right leg.  He has a bone spur in the right foot which is kept him from wearing compression stockings now for several weeks on the right leg.  He has been wearing compression stockings on the left leg.  There is a marked difference between the 2 legs.  Although the left leg remains somewhat swollen, the skin is reasonably healthy and intact and only mildly dry.  On the right, the skin is very dry with open ulcerations and scab formation laterally and posteriorly.  There is also some mild surrounding erythema.  This has been having weeping and drainage.  No fevers or chills or signs of systemic infection.  Current Outpatient Medications  Medication Sig Dispense Refill  . albuterol (VENTOLIN HFA) 108 (90 Base) MCG/ACT inhaler Inhale 2 puffs into the lungs every 6 (six) hours as needed for wheezing or shortness of breath.    Marland Kitchen atorvastatin (LIPITOR) 20 MG tablet Take 20 mg by mouth at bedtime.     Marland Kitchen buPROPion (WELLBUTRIN SR) 150 MG 12 hr tablet Take 150 mg by mouth 2 (two) times daily.    . Calcium Carbonate-Vit D-Min (GNP CALCIUM 1200) 1200-1000 MG-UNIT CHEW Chew 1,200 mg by mouth daily with breakfast. Take in combination with vitamin D and magnesium. 30 tablet 5  . Cholecalciferol (VITAMIN D3) 5000 units CAPS Take 1 capsule (5,000 Units total) by mouth daily with breakfast. Take along with calcium and magnesium. 30 capsule 5  . ergocalciferol (VITAMIN D2) 50000 units capsule Take 1 capsule (50,000 Units total) by mouth 2 (two) times a week. X 6 weeks. 12 capsule 0  . Fluticasone-Salmeterol (ADVAIR DISKUS) 100-50 MCG/DOSE AEPB Inhale 1 puff into the lungs 2 (two) times  daily.     . furosemide (LASIX) 40 MG tablet Take 1 tablet (40 mg total) by mouth daily. 30 tablet 0  . hydrochlorothiazide (MICROZIDE) 12.5 MG capsule Take 12.5 mg by mouth daily.   0  . loratadine (CLARITIN) 10 MG tablet Take 10 mg by mouth daily.    Marland Kitchen losartan (COZAAR) 50 MG tablet Take 1 tablet (50 mg total) by mouth daily. 30 tablet 0  . Magnesium 500 MG CAPS Take 1 capsule (500 mg total) by mouth 2 (two) times daily at 8 am and 10 pm. 60 capsule 5  . metFORMIN (GLUCOPHAGE) 1000 MG tablet Take 1,000 mg by mouth 2 (two) times daily with a meal.    . oxybutynin (DITROPAN) 5 MG tablet Take 5 mg by mouth every 12 (twelve) hours.    . pantoprazole (PROTONIX) 40 MG tablet Take 40 mg by mouth 2 (two) times daily.      No current facility-administered medications for this visit.     Past Medical History:  Diagnosis Date  . COPD (chronic obstructive pulmonary disease) ([place])   . Coronary artery disease   . Diabetes mellitus without complication ([place])   . Hypertension   . Peptic ulcer   . Sleep apnea     Past Surgical History:  Procedure Laterality Date  . COLONOSCOPY WITH PROPOFOL N/A 11/09/2016   Procedure: COLONOSCOPY WITH PROPOFOL;  Surgeon: Lollie Sails, MD;  Location: [facility] ENDOSCOPY;  Service: Endoscopy;  Laterality: N/A;  . COLONOSCOPY WITH PROPOFOL N/A 07/06/2017   Procedure: COLONOSCOPY WITH PROPOFOL;  Surgeon: Lollie Sails, MD;  Location: [facility] ENDOSCOPY;  Service: Endoscopy;  Laterality: N/A;  . ESOPHAGOGASTRODUODENOSCOPY (EGD) WITH PROPOFOL N/A 11/09/2016   Procedure: ESOPHAGOGASTRODUODENOSCOPY (EGD) WITH PROPOFOL;  Surgeon: Lollie Sails, MD;  Location: [facility] ENDOSCOPY;  Service: Endoscopy;  Laterality: N/A;  . ESOPHAGOGASTRODUODENOSCOPY (EGD) WITH PROPOFOL N/A 07/06/2017   Procedure: ESOPHAGOGASTRODUODENOSCOPY (EGD) WITH PROPOFOL;  Surgeon: Lollie Sails, MD;  Location: [facility] ENDOSCOPY;  Service: Endoscopy;  Laterality: N/A;   Family History  Problem  Relation Age of Onset  . Diabetes Mother   . Diabetes Father   . Stomach cancer Father   No bleeding or clotting disorders  Social History      Social History  Substance Use Topics  . Smoking status: Current Every Day Smoker    Packs/day: 1.00    Years: 30.00    Types: Cigarettes  . Smokeless tobacco: Never Used  . Alcohol use No  No IVDU      Allergies  Allergen Reactions  . Shellfish Allergy Swelling  . Codeine Rash  . Lisinopril Cough    No current facility-administered medications for this visit.       REVIEW OF SYSTEMS(Negative unless checked)  Constitutional: [] Weight loss[] Fever[] Chills Cardiac:[] Chest Bell[] Chest pressure[] Palpitations [] Shortness of breath when laying flat [] Shortness of breath at rest [x] Shortness of breath with exertion. Vascular: [] Bell in legs with walking[x] Bell in legsat rest[] Bell in legs when laying flat [] Claudication [] Bell in feet when walking [] Bell in feet at rest [] Bell in feet when laying flat [] History of DVT [] Phlebitis [x] Swelling in legs [] Varicose veins [x] Non-healing ulcers Pulmonary: [] Uses home oxygen [] Productive cough[] Hemoptysis [] Wheeze [x] COPD [] Asthma Neurologic: [] Dizziness [] Blackouts [] Seizures [] History of stroke [] History of TIA[] Aphasia [] Temporary blindness[] Dysphagia [] Weaknessor numbness in arms [] Weakness or numbnessin legs Musculoskeletal: [] Arthritis [] Joint swelling [x] Joint Bell [] Low back Bell Hematologic:[] Easy bruising[] Easy bleeding [] Hypercoagulable state [] Anemic [] Hepatitis Gastrointestinal:[] Blood in stool[] Vomiting blood[] Gastroesophageal reflux/heartburn[] Abdominal Bell Genitourinary: [] Chronic kidney disease [] Difficulturination [] Frequenturination [] Burning with urination[] Hematuria Skin: [] Rashes [x] Ulcers [x] Wounds Psychological: [] History of  anxiety[] History of major depression.     Physical Examination  BP (!) 167/102 (BP Location: Right Arm, Patient Position: Sitting)   Pulse 93   Resp 19   Ht 5' 10.5" (1.791 m)   Wt (!) 313 lb (142 kg)   BMI 44.28 kg/m  Gen:  WD/WN, NAD.  Obese Head: [place]/AT, No temporalis wasting. Ear/Nose/Throat: Hearing grossly intact, nares w/o erythema or drainage Eyes: Conjunctiva clear. Sclera non-icteric Neck: Supple.  Trachea midline Pulmonary:  Good air movement, no use of accessory muscles.  Cardiac: RRR, no JVD Vascular:  Vessel Right Left  Radial Palpable Palpable                                   Gastrointestinal: soft, non-tender/non-distended.  Musculoskeletal: M/S 5/5 throughout.  No deformity or atrophy. 2-3+ RLE edema, 1-2+ LLE edema.  Calves with skin breakdown are present on the right lower extremity. Neurologic: Sensation grossly intact in extremities.  Symmetrical.  Speech is fluent.  Psychiatric: Judgment intact, Mood & affect appropriate for pt's clinical situation. Dermatologic: Scabs with superficial skin ulcerations are present on the right lower extremity laterally and posteriorly.  No open wounds are present on the left but stasis dermatitis changes are marked bilaterally.       Labs Recent Results (from the past 2160 hour(s))  Compliance Drug Analysis, Ur     Status: None   Collection Time: 10/07/17  4:00 PM  Result Value Ref Range   Summary FINAL     Comment: ==================================================================== [facility]RE COMP DRUG ANALYSIS,UR ==================================================================== Test                             Result       Flag       Units Drug Present and Declared for Prescription Verification   Bupropion                      PRESENT      EXPECTED   Hydroxybupropion               PRESENT      EXPECTED    Hydroxybupropion is an expected metabolite of bupropion. Drug Present not Declared for  Prescription Verification   Acetaminophen                  PRESENT      UNEXPECTED ==================================================================== Test                      Result    Flag   Units      Ref Range   Creatinine              157              mg/dL      >=20 ==================================================================== Declared Medications:  The flagging and interpretation on this report are based on the  following declared medications.  Unexpected results may arise f rom  inaccuracies in the declared medications.  **Note: The testing scope of this panel includes these medications:  Bupropion  **Note: The testing scope of this panel does not include following  reported medications:  Albuterol  Atorvastatin  Fluticasone (Advair)  Furosemide (Lasix)  Hydrochlorothiazide (Microzide)  Loratadine (Claritin)  Losartan (Cozaar)  Metformin  Oxybutynin (Ditropan)  Pantoprazole (Protonix)  Salmeterol (Advair)  Tiotropium (Spiriva) ==================================================================== For clinical consultation, please call [phone]. ====================================================================   Comp. Metabolic Panel (12)     Status: None   Collection Time: 10/07/17  4:04 PM  Result Value Ref Range   Glucose 80 65 - 99 mg/dL   BUN 10 6 - 24 mg/dL   Creatinine, Ser 1.10 0.76 - 1.27 mg/dL   GFR calc non Af Amer 77 >59 mL/min/1.73   GFR calc Af Amer 89 >59 mL/min/1.73   BUN/Creatinine Ratio 9 9 - 20   Sodium 140 134 - 144 mmol/L   Potassium 4.4 3.5 - 5.2 mmol/L   Chloride 98 96 - 106 mmol/L   Calcium 9.9 8.7 - 10.2 mg/dL   Total Protein 6.6 6.0 - 8.5 g/dL   Albumin 4.1 3.5 - 5.5 g/dL   Globulin, Total 2.5 1.5 - 4.5 g/dL   Albumin/Globulin Ratio 1.6 1.2 - 2.2   Bilirubin Total 0.3 0.0 - 1.2 mg/dL   Alkaline Phosphatase 94 39 - 117 IU/L   AST 18 0 - 40 IU/L  Magnesium     Status: None   Collection Time: 10/07/17  4:04 PM  Result  Value Ref Range   Magnesium 1.7 1.6 - 2.3 mg/dL  Vitamin B12     Status: None   Collection Time: 10/07/17  4:04 PM  Result Value Ref Range   Vitamin B-12 466 232 - 1,245 pg/mL  Sedimentation rate  Status: None   Collection Time: 10/07/17  4:04 PM  Result Value Ref Range   Sed Rate 23 0 - 30 mm/hr  25-Hydroxyvitamin D Lcms D2+D3     Status: Abnormal   Collection Time: 10/07/17  4:04 PM  Result Value Ref Range   25-Hydroxy, Vitamin D 29 (L) ng/mL    Comment: Reference Range: All Ages: Target levels 30 - 100    25-Hydroxy, Vitamin D-2 <1.0 ng/mL   25-Hydroxy, Vitamin D-3 29 ng/mL  C-reactive protein     Status: None   Collection Time: 10/07/17  4:04 PM  Result Value Ref Range   CRP 5 0 - 10 mg/L  Comprehensive metabolic panel     Status: Abnormal   Collection Time: 10/15/17  7:53 PM  Result Value Ref Range   Sodium 141 135 - 145 mmol/L   Potassium 4.3 3.5 - 5.1 mmol/L   Chloride 105 98 - 111 mmol/L   CO2 28 22 - 32 mmol/L   Glucose, Bld 100 (H) 70 - 99 mg/dL   BUN 12 6 - 20 mg/dL   Creatinine, Ser 1.04 0.61 - 1.24 mg/dL   Calcium 9.2 8.9 - 10.3 mg/dL   Total Protein 7.2 6.5 - 8.1 g/dL   Albumin 4.0 3.5 - 5.0 g/dL   AST 21 15 - 41 U/L   ALT 23 0 - 44 U/L   Alkaline Phosphatase 85 38 - 126 U/L   Total Bilirubin 0.6 0.3 - 1.2 mg/dL   GFR calc non Af Amer >60 >60 mL/min   GFR calc Af Amer >60 >60 mL/min    Comment: (NOTE) The eGFR has been calculated using the CKD EPI equation. This calculation has not been validated in all clinical situations. eGFR's persistently <60 mL/min signify possible Chronic Kidney Disease.    Anion gap 8 5 - 15    Comment: Performed at [facility], [address] [place], [place] [address]  Ethanol     Status: None   Collection Time: 10/15/17  7:53 PM  Result Value Ref Range   Alcohol, Ethyl (B) <10 <10 mg/dL    Comment: (NOTE) Lowest detectable limit for serum alcohol is 10 mg/dL. For medical purposes only. Performed at  [facility], [address] [place], [place] [address]   Salicylate level     Status: None   Collection Time: 10/15/17  7:53 PM  Result Value Ref Range   Salicylate Lvl <8.3 2.8 - 30.0 mg/dL    Comment: Performed at [facility], [address] [place], [place] [address]  Acetaminophen level     Status: Abnormal   Collection Time: 10/15/17  7:53 PM  Result Value Ref Range   Acetaminophen (Tylenol), Serum <10 (L) 10 - 30 ug/mL    Comment: (NOTE) Therapeutic concentrations vary significantly. A range of 10-30 ug/mL  may be an effective concentration for many patients. However, some  are best treated at concentrations outside of this range. Acetaminophen concentrations >150 ug/mL at 4 hours after ingestion  and >50 ug/mL at 12 hours after ingestion are often associated with  toxic reactions. Performed at [facility], [address] [place], [place] [address]   cbc     Status: None   Collection Time: 10/15/17  7:53 PM  Result Value Ref Range   WBC 8.7 3.8 - 10.6 K/uL   RBC 5.04 4.40 - 5.90 MIL/uL   Hemoglobin 14.6 13.0 - 18.0 g/dL   HCT 43.1 40.0 - 52.0 %  MCV 85.6 80.0 - 100.0 fL   MCH 28.9 26.0 - 34.0 pg   MCHC 33.8 32.0 - 36.0 g/dL   RDW 14.1 11.5 - 14.5 %   Platelets 200 150 - 440 K/uL    Comment: Performed at [facility], [address] [place], [place] [address]  Glucose, capillary     Status: Abnormal   Collection Time: 10/16/17  8:04 AM  Result Value Ref Range   Glucose-Capillary 114 (H) 70 - 99 mg/dL   Comment 1 Notify RN     Radiology Dg C-arm 1-60 Min-no Report  Result Date: 11/18/2017 Fluoroscopy was utilized by the requesting physician.  No radiographic interpretation.    Assessment/Plan Obesity Discussed that weight loss would be very beneficial for wound healing and leg swelling.  Essential hypertension, benign blood pressure control important in reducing the progression of atherosclerotic disease. On  appropriate oral medications.   Hyperlipidemia lipid control important in reducing the progression of atherosclerotic disease. Continue statin therapy  DM2 (diabetes mellitus, type 2) (HCC) blood glucose control important in reducing the progression of atherosclerotic disease. Also, involved in wound healing. On appropriate medications.   Swelling of limb He will continue to wear the compression stocking on the left leg.  We are going to place him in a 3 layer Unna boot on the right leg today and change this weekly.  This should help get the swelling and skin breakdown under control.  We will reassess him in 3 to 4 weeks.  We come out of the [employer], it was again stressed he will absolutely need to wear compression stockings going forward.  Lower limb ulcer, calf, right, limited to breakdown of skin (HCC) A 3 layer Unna boot was placed on the right lower extremity today.  This will be changed weekly.    Jonathan Pain, MD  11/26/2017 11:04 AM    This note was created with Dragon medical transcription system.  Any errors from dictation are purely unintentional

## 2017-11-26 NOTE — Assessment & Plan Note (Signed)
blood glucose control important in reducing the progression of atherosclerotic disease. Also, involved in wound healing. On appropriate medications.  

## 2017-11-26 NOTE — Assessment & Plan Note (Signed)
He will continue to wear the compression stocking on the left leg.  We are going to place him in a 3 layer Unna boot on the right leg today and change this weekly.  This should help get the swelling and skin breakdown under control.  We will reassess him in 3 to 4 weeks.  We come out of the [employer], it was again stressed he will absolutely need to wear compression stockings going forward.

## 2017-12-03 ENCOUNTER — Encounter (INDEPENDENT_AMBULATORY_CARE_PROVIDER_SITE_OTHER): Payer: Self-pay | Admitting: Vascular Surgery

## 2017-12-03 ENCOUNTER — Ambulatory Visit (INDEPENDENT_AMBULATORY_CARE_PROVIDER_SITE_OTHER): Payer: Medicaid Other | Admitting: Vascular Surgery

## 2017-12-03 VITALS — BP 142/87 | HR 90 | Resp 16 | Ht 69.0 in | Wt 310.0 lb

## 2017-12-03 DIAGNOSIS — L97211 Non-pressure chronic ulcer of right calf limited to breakdown of skin: Secondary | ICD-10-CM

## 2017-12-03 DIAGNOSIS — M7989 Other specified soft tissue disorders: Secondary | ICD-10-CM

## 2017-12-03 DIAGNOSIS — R600 Localized edema: Secondary | ICD-10-CM | POA: Diagnosis not present

## 2017-12-03 NOTE — Progress Notes (Signed)
History of Present Illness  Swelling and skin ulceration RLE  Assessments & Plan   Ulcer right calf, swelling    Additional instructions  Subjective:  Patient presents with venous ulcer of the Right lower extremity.    Procedure:  3 layer unna wrap was placed Right lower extremity.   Plan:   Follow up in one week.

## 2017-12-06 ENCOUNTER — Ambulatory Visit: Payer: Medicaid Other | Admitting: Pain Medicine

## 2017-12-08 ENCOUNTER — Ambulatory Visit (INDEPENDENT_AMBULATORY_CARE_PROVIDER_SITE_OTHER): Payer: Medicaid Other | Admitting: Nurse Practitioner

## 2017-12-08 ENCOUNTER — Encounter (INDEPENDENT_AMBULATORY_CARE_PROVIDER_SITE_OTHER): Payer: Self-pay

## 2017-12-08 VITALS — BP 142/91 | HR 95 | Resp 16 | Ht 69.0 in | Wt 316.0 lb

## 2017-12-08 DIAGNOSIS — L97211 Non-pressure chronic ulcer of right calf limited to breakdown of skin: Secondary | ICD-10-CM | POA: Diagnosis not present

## 2017-12-08 NOTE — Progress Notes (Signed)
History of Present Illness  There is no documented history at this time  Assessments & Plan   There are no diagnoses linked to this encounter.    Additional instructions  Subjective:  Patient presents with venous ulcer of the Right lower extremity.    Procedure:  3 layer unna wrap was placed Right lower extremity.   Plan:   Follow up in one week.   

## 2017-12-12 NOTE — Progress Notes (Signed)
Patient's Name: [name]  MRN: [id number]  Referring Provider: Theotis Bell*  DOB: 08/13/1965  PCP: Jonathan Burrow, MD  DOS: 12/13/2017  Note by: Gaspar Cola, MD  Service setting: Ambulatory outpatient  Specialty: Interventional Pain Management  Location: [facility] (AMB) Pain Management Facility    Patient type: Established   Primary Reason(s) for Visit: Encounter for post-procedure evaluation of chronic illness with mild to moderate exacerbation CC: Back Pain (low and center)  HPI  [name] is a 52 y.o. year old, male patient, who comes today for a post-procedure evaluation. He has Cellulitis; Essential hypertension, benign; Hyperlipidemia; Tobacco dependence; Obesity; Lower limb ulcer, ankle, left, with fat layer exposed ([place]); Lymphedema; Barrett's esophagus without dysplasia; Colon cancer screening; Chronic low back pain (Primary Area of Pain) (Bilateral) (R>L) w/ sciatica (Bilateral); Chronic lower extremity pain (Secondary Area of Pain) (Bilateral) (R>L); Chronic pain syndrome; Pharmacologic therapy; Disorder of skeletal system; Problems influencing health status; Adjustment disorder with mixed disturbance of emotions and conduct; Grief; DDD (degenerative disc disease), lumbar; Lumbar facet hypertrophy (Bilateral); Lumbar central spinal stenosis (L4-5); 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 His primarily concern today is the Back Pain (low and center)  Pain Assessment: Location: Lower Back Radiating: denies Onset: More than a month ago Duration: Chronic pain Quality: Throbbing,  Constant Severity: 4 /10 (subjective, self-reported pain score)  Note: Reported level is compatible with observation.                         When using our objective Pain Scale, levels between 6 and 10/10 are said to belong in an emergency room, as it progressively worsens from a 6/10, described as severely limiting, requiring emergency care not usually available at an outpatient pain management facility. At a 6/10 level, communication becomes difficult and requires great effort. Assistance to reach the emergency department may be required. Facial flushing and profuse sweating along with potentially dangerous increases in heart rate and blood pressure will be evident. Timing: Constant Modifying factors: rest, but now returns with prolonged sitting or lying down BP: (!) 144/96  HR: 87  [name] comes in today for post-procedure evaluation.  Further details on both, my assessment(s), as well as the proposed treatment plan, please see below.  Post-Procedure Assessment  11/18/2017 Procedure: Diagnostic bilateral lumbar facet block #1 under fluoroscopic guidance and IV sedation Pre-procedure pain score:  4/10 Post-procedure pain score: 0/10 (100% relief) Influential Factors: BMI: 46.22 kg/m Intra-procedural challenges: None observed.         Assessment challenges: None detected.              Reported side-effects: None.        Post-procedural adverse reactions or complications: None reported         Sedation: Sedation provided. When no sedatives are used, the analgesic levels obtained are directly associated to the effectiveness of the local anesthetics. However, when sedation is provided, the level of analgesia obtained during the initial 1 hour following the intervention, is believed to be the result of a combination of factors. These factors may include, but are not limited to: 1. The effectiveness of the local anesthetics  used. 2. The effects of the analgesic(s) and/or anxiolytic(s)  used. 3. The degree of discomfort experienced by the patient at the time of the procedure. 4. The patients ability and reliability in recalling and recording the events. 5. The presence and influence of possible secondary gains and/or psychosocial factors. Reported result: Relief experienced during the 1st hour after the procedure: 100 % (Ultra-Short Term Relief)            Interpretative annotation: Clinically appropriate result. Analgesia during this period is likely to be Local Anesthetic and/or IV Sedative (Analgesic/Anxiolytic) related.          Effects of local anesthetic: The analgesic effects attained during this period are directly associated to the localized infiltration of local anesthetics and therefore cary significant diagnostic value as to the etiological location, or anatomical origin, of the pain. Expected duration of relief is directly dependent on the pharmacodynamics of the local anesthetic used. Long-acting (4-6 hours) anesthetics used.  Reported result: Relief during the next 4 to 6 hour after the procedure: 100 % (Short-Term Relief)            Interpretative annotation: Clinically appropriate result. Analgesia during this period is likely to be Local Anesthetic-related.          Long-term benefit: Defined as the period of time past the expected duration of local anesthetics (1 hour for short-acting and 4-6 hours for long-acting). With the possible exception of prolonged sympathetic blockade from the local anesthetics, benefits during this period are typically attributed to, or associated with, other factors such as analgesic sensory neuropraxia, antiinflammatory effects, or beneficial biochemical changes provided by agents other than the local anesthetics.  Reported result: Extended relief following procedure: 100 % (Long-Term Relief) (x4 days)            Interpretative annotation: Clinically possible results. Good relief. No permanent benefit expected. No significant inflammatory  component detected.          Current benefits: Defined as reported results that persistent at this point in time.   Analgesia: 0 %            Function: Back to baseline ROM: Back to baseline Interpretative annotation: Recurrence of symptoms. No permanent benefit expected. Results would suggest persistent aggravating factors.          Interpretation: Results would suggest a successful diagnostic intervention.                  Plan:  Consider diagnostic procedure No.: 2          Laboratory Chemistry  Inflammation Markers (CRP: Acute Phase) (ESR: Chronic Phase) Lab Results  Component Value Date   CRP 5 10/07/2017   ESRSEDRATE 23 10/07/2017                         Rheumatology Markers No results found.  Renal Markers Lab Results  Component Value Date   BUN 12 10/15/2017   CREATININE 1.04 10/15/2017   BCR 9 10/07/2017   GFRAA >60 10/15/2017   GFRNONAA >60 10/15/2017                             Hepatic Markers Lab Results  Component Value Date   AST 21 10/15/2017   ALT 23 10/15/2017   ALBUMIN 4.0 10/15/2017  Neuropathy Markers Lab Results  Component Value Date   VITAMINB12 466 10/07/2017                        Hematology Parameters Lab Results  Component Value Date   PLT 200 10/15/2017   HGB 14.6 10/15/2017   HCT 43.1 10/15/2017                        CV Markers No results found.  Note: Lab results reviewed.  Recent Imaging Results   Results for orders placed in visit on 11/18/17  DG C-Arm 1-60 Min-No Report   Narrative Fluoroscopy was utilized by the requesting physician.  No radiographic  interpretation.    Interpretation Report: Fluoroscopy was used during the procedure to assist with needle guidance. The images were interpreted intraoperatively by the requesting physician.  Meds   Current Outpatient Medications:  .  albuterol (VENTOLIN HFA) 108 (90 Base) MCG/ACT inhaler, Inhale 2 puffs into the lungs every 6 (six) hours as  needed for wheezing or shortness of breath., Disp: , Rfl:  .  atorvastatin (LIPITOR) 20 MG tablet, Take 20 mg by mouth at bedtime. , Disp: , Rfl:  .  buPROPion (WELLBUTRIN SR) 150 MG 12 hr tablet, Take 150 mg by mouth 2 (two) times daily., Disp: , Rfl:  .  Calcium Carbonate-Vit D-Min (GNP CALCIUM 1200) 1200-1000 MG-UNIT CHEW, Chew 1,200 mg by mouth daily with breakfast. Take in combination with vitamin D and magnesium., Disp: 30 tablet, Rfl: 5 .  Cholecalciferol (VITAMIN D3) 5000 units CAPS, Take 1 capsule (5,000 Units total) by mouth daily with breakfast. Take along with calcium and magnesium., Disp: 30 capsule, Rfl: 5 .  ergocalciferol (VITAMIN D2) 50000 units capsule, Take 1 capsule (50,000 Units total) by mouth 2 (two) times a week. X 6 weeks., Disp: 12 capsule, Rfl: 0 .  Fluticasone-Salmeterol (ADVAIR DISKUS) 100-50 MCG/DOSE AEPB, Inhale 1 puff into the lungs 2 (two) times daily. , Disp: , Rfl:  .  furosemide (LASIX) 40 MG tablet, Take 1 tablet (40 mg total) by mouth daily., Disp: 30 tablet, Rfl: 0 .  hydrochlorothiazide (MICROZIDE) 12.5 MG capsule, Take 12.5 mg by mouth daily. , Disp: , Rfl: 0 .  loratadine (CLARITIN) 10 MG tablet, Take 10 mg by mouth daily., Disp: , Rfl:  .  losartan (COZAAR) 50 MG tablet, Take 1 tablet (50 mg total) by mouth daily., Disp: 30 tablet, Rfl: 0 .  Magnesium 500 MG CAPS, Take 1 capsule (500 mg total) by mouth 2 (two) times daily at 8 am and 10 pm., Disp: 60 capsule, Rfl: 5 .  metFORMIN (GLUCOPHAGE) 1000 MG tablet, Take 1,000 mg by mouth 2 (two) times daily with a meal., Disp: , Rfl:  .  oxybutynin (DITROPAN) 5 MG tablet, Take 5 mg by mouth every 12 (twelve) hours., Disp: , Rfl:  .  pantoprazole (PROTONIX) 40 MG tablet, Take 40 mg by mouth 2 (two) times daily. , Disp: , Rfl:   ROS  Constitutional: Denies any fever or chills Gastrointestinal: No reported hemesis, hematochezia, vomiting, or acute GI distress Musculoskeletal: Denies any acute onset joint swelling,  redness, loss of ROM, or weakness Neurological: No reported episodes of acute onset apraxia, aphasia, dysarthria, agnosia, amnesia, paralysis, loss of coordination, or loss of consciousness  Allergies  Mr. Roza is allergic to shellfish allergy; codeine; and lisinopril.  [place]  Drug: Mr. Curci  reports that he does not use drugs. Alcohol:  reports that he does not drink alcohol. Tobacco:  reports that he has been smoking cigarettes. He has a 30.00 pack-year smoking history. He has never used smokeless tobacco. Medical:  has a past medical history of COPD (chronic obstructive pulmonary disease) ([place]), Coronary artery disease, Diabetes mellitus without complication ([place]), Hypertension, Peptic ulcer, and Sleep apnea. Surgical: Mr. Mcduffee  has a past surgical history that includes Esophagogastroduodenoscopy (egd) with propofol (N/A, 11/09/2016); Colonoscopy with propofol (N/A, 11/09/2016); Colonoscopy with propofol (N/A, 07/06/2017); and Esophagogastroduodenoscopy (egd) with propofol (N/A, 07/06/2017). Family: family history includes Diabetes in his father and mother; Stomach cancer in his father.  Constitutional Exam  General appearance: Well nourished, well developed, and well hydrated. In no apparent acute distress Vitals:   12/13/17 1321  BP: (!) 144/96  Pulse: 87  Resp: 16  Temp: 97.6 F (36.4 C)  TempSrc: Oral  SpO2: 98%  Weight: (!) 313 lb (142 kg)  Height: 5' 9"  (1.753 m)   BMI Assessment: Estimated body mass index is 46.22 kg/m as calculated from the following:   Height as of this encounter: 5' 9"  (1.753 m).   Weight as of this encounter: 313 lb (142 kg).  BMI interpretation table: BMI level Category Range association with higher incidence of chronic pain  <18 kg/m2 Underweight   18.5-24.9 kg/m2 Ideal body weight   25-29.9 kg/m2 Overweight Increased incidence by 20%  30-34.9 kg/m2 Obese (Class I) Increased incidence by 68%  35-39.9 kg/m2 Severe obesity (Class II) Increased  incidence by 136%  >40 kg/m2 Extreme obesity (Class III) Increased incidence by 254%   Patient's current BMI Ideal Body weight  Body mass index is 46.22 kg/m. Ideal body weight: 70.7 kg (155 lb 13.8 oz) Adjusted ideal body weight: 99.2 kg (218 lb 11.5 oz)   BMI Readings from Last 4 Encounters:  12/13/17 46.22 kg/m  12/08/17 46.67 kg/m  12/03/17 45.78 kg/m  11/26/17 44.28 kg/m   Wt Readings from Last 4 Encounters:  12/13/17 (!) 313 lb (142 kg)  12/08/17 (!) 316 lb (143.3 kg)  12/03/17 (!) 310 lb (140.6 kg)  11/26/17 (!) 313 lb (142 kg)  Psych/Mental status: Alert, oriented x 3 (person, place, & time)       Eyes: PERLA Respiratory: No evidence of acute respiratory distress  Cervical Spine Area Exam  Skin & Axial Inspection: No masses, redness, edema, swelling, or associated skin lesions Alignment: Symmetrical Functional ROM: Unrestricted ROM      Stability: No instability detected Muscle Tone/Strength: Functionally intact. No obvious neuro-muscular anomalies detected. Sensory (Neurological): Unimpaired Palpation: No palpable anomalies              Upper Extremity (UE) Exam    Side: Right upper extremity  Side: Left upper extremity  Skin & Extremity Inspection: Skin color, temperature, and hair growth are WNL. No peripheral edema or cyanosis. No masses, redness, swelling, asymmetry, or associated skin lesions. No contractures.  Skin & Extremity Inspection: Skin color, temperature, and hair growth are WNL. No peripheral edema or cyanosis. No masses, redness, swelling, asymmetry, or associated skin lesions. No contractures.  Functional ROM: Unrestricted ROM          Functional ROM: Unrestricted ROM          Muscle Tone/Strength: Functionally intact. No obvious neuro-muscular anomalies detected.  Muscle Tone/Strength: Functionally intact. No obvious neuro-muscular anomalies detected.  Sensory (Neurological): Unimpaired          Sensory (Neurological): Unimpaired           Palpation:  No palpable anomalies              Palpation: No palpable anomalies              Provocative Test(s):  Phalen's test: deferred Tinel's test: deferred Apley's scratch test (touch opposite shoulder):  Action 1 (Across chest): deferred Action 2 (Overhead): deferred Action 3 (LB reach): deferred   Provocative Test(s):  Phalen's test: deferred Tinel's test: deferred Apley's scratch test (touch opposite shoulder):  Action 1 (Across chest): deferred Action 2 (Overhead): deferred Action 3 (LB reach): deferred    Thoracic Spine Area Exam  Skin & Axial Inspection: No masses, redness, or swelling Alignment: Symmetrical Functional ROM: Unrestricted ROM Stability: No instability detected Muscle Tone/Strength: Functionally intact. No obvious neuro-muscular anomalies detected. Sensory (Neurological): Unimpaired Muscle strength & Tone: No palpable anomalies  Lumbar Spine Area Exam  Skin & Axial Inspection: No masses, redness, or swelling Alignment: Symmetrical Functional ROM: Decreased ROM       Stability: No instability detected Muscle Tone/Strength: Increased muscle tone over affected area Sensory (Neurological): Movement-associated pain Palpation: Complains of area being tender to palpation       Provocative Tests: Hyperextension/rotation test: (+) bilaterally for facet joint pain. Lumbar quadrant test (Kemp's test): (+) bilaterally for facet joint pain. Lateral bending test: deferred today       Patrick's Maneuver: deferred today                   FABER test: deferred today                   S-I anterior distraction/compression test: deferred today         S-I lateral compression test: deferred today         S-I Thigh-thrust test: deferred today         S-I Gaenslen's test: deferred today          Gait & Posture Assessment  Ambulation: Unassisted Gait: Relatively normal for age and body habitus Posture: Antalgic   Lower Extremity Exam    Side: Right lower extremity   Side: Left lower extremity  Stability: No instability observed          Stability: No instability observed          Skin & Extremity Inspection: Skin color, temperature, and hair growth are WNL. No peripheral edema or cyanosis. No masses, redness, swelling, asymmetry, or associated skin lesions. No contractures.  Skin & Extremity Inspection: Skin color, temperature, and hair growth are WNL. No peripheral edema or cyanosis. No masses, redness, swelling, asymmetry, or associated skin lesions. No contractures.  Functional ROM: Unrestricted ROM                  Functional ROM: Unrestricted ROM                  Muscle Tone/Strength: Functionally intact. No obvious neuro-muscular anomalies detected.  Muscle Tone/Strength: Functionally intact. No obvious neuro-muscular anomalies detected.  Sensory (Neurological): Unimpaired        Sensory (Neurological): Unimpaired        DTR: Patellar: deferred today Achilles: deferred today Plantar: deferred today  DTR: Patellar: deferred today Achilles: deferred today Plantar: deferred today  Palpation: No palpable anomalies  Palpation: No palpable anomalies   Assessment  Primary Diagnosis & Pertinent Problem List: The primary encounter diagnosis was Chronic low back pain (Primary Area of Pain) (Bilateral) (R>L) w/ sciatica (Bilateral). Diagnoses of Chronic lower extremity pain (Secondary Area of Pain) (  Bilateral) (R>L), Lumbar facet syndrome (Bilateral) (R>L), Morbid obesity with BMI of 45.0-49.9, adult ([facility]), Lumbar facet hypertrophy (Bilateral), and Spondylosis without myelopathy or radiculopathy, lumbar region were also pertinent to this visit.  Status Diagnosis  Persistent Controlled Persistent 1. Chronic low back pain (Primary Area of Pain) (Bilateral) (R>L) w/ sciatica (Bilateral)   2. Chronic lower extremity pain (Secondary Area of Pain) (Bilateral) (R>L)   3. Lumbar facet syndrome (Bilateral) (R>L)   4. Morbid obesity with BMI of 45.0-49.9, adult  ([place])   5. Lumbar facet hypertrophy (Bilateral)   6. Spondylosis without myelopathy or radiculopathy, lumbar region     Problems updated and reviewed during this visit: No problems updated. Plan of Care  Pharmacotherapy (Medications Ordered): No orders of the defined types were placed in this encounter.  Medications administered today: [name] had no medications administered during this visit.   Procedure Orders     LUMBAR FACET(MEDIAL BRANCH NERVE BLOCK) [facility] Lab Orders  No laboratory test(s) ordered today   Imaging Orders  No imaging studies ordered today   Referral Orders  No referral(s) requested today   Interventional management options: Planned, scheduled, and/or pending:   NOTE: NO RFA until BMI is <35  Diagnostic bilateral lumbar facet block #2 under fluoroscopic guidance and IV sedation    Considering:   Diagnosticbilateral lumbar epidural steroid injection  Diagnostic bilateral lumbar facet nerve block  Possible bilateral lumbar facet RFA    Palliative PRN treatment(s):   None at this time   Provider-requested follow-up: Return for Procedure (w/ sedation): (B) L-FCT BLK #2.  Future Appointments  Date Time Provider [place]  12/17/2017  9:00 AM [facility] None  12/23/2017 12:45 PM Milinda Pointer, MD [facility] None  12/24/2017  9:15 AM Kris Hartmann, NP [facility] None   Primary Care Physician: Jonathan Burrow, MD Location: [facility] Note by: Gaspar Cola, MD Date: 12/13/2017; Time: 1:48 PM

## 2017-12-13 ENCOUNTER — Other Ambulatory Visit: Payer: Self-pay

## 2017-12-13 ENCOUNTER — Ambulatory Visit: Payer: Medicaid Other | Attending: Pain Medicine | Admitting: Pain Medicine

## 2017-12-13 ENCOUNTER — Encounter: Payer: Self-pay | Admitting: Pain Medicine

## 2017-12-13 VITALS — BP 144/96 | HR 87 | Temp 97.6°F | Resp 16 | Ht 69.0 in | Wt 313.0 lb

## 2017-12-13 DIAGNOSIS — M48061 Spinal stenosis, lumbar region without neurogenic claudication: Secondary | ICD-10-CM | POA: Insufficient documentation

## 2017-12-13 DIAGNOSIS — Z6841 Body Mass Index (BMI) 40.0 and over, adult: Secondary | ICD-10-CM | POA: Diagnosis not present

## 2017-12-13 DIAGNOSIS — Z7984 Long term (current) use of oral hypoglycemic drugs: Secondary | ICD-10-CM | POA: Diagnosis not present

## 2017-12-13 DIAGNOSIS — Z885 Allergy status to narcotic agent status: Secondary | ICD-10-CM | POA: Diagnosis not present

## 2017-12-13 DIAGNOSIS — I2510 Atherosclerotic heart disease of native coronary artery without angina pectoris: Secondary | ICD-10-CM | POA: Diagnosis not present

## 2017-12-13 DIAGNOSIS — I890 Lymphedema, not elsewhere classified: Secondary | ICD-10-CM | POA: Diagnosis not present

## 2017-12-13 DIAGNOSIS — M79605 Pain in left leg: Secondary | ICD-10-CM

## 2017-12-13 DIAGNOSIS — F17210 Nicotine dependence, cigarettes, uncomplicated: Secondary | ICD-10-CM | POA: Diagnosis not present

## 2017-12-13 DIAGNOSIS — K2270 Barrett's esophagus without dysplasia: Secondary | ICD-10-CM | POA: Insufficient documentation

## 2017-12-13 DIAGNOSIS — Z91013 Allergy to seafood: Secondary | ICD-10-CM | POA: Diagnosis not present

## 2017-12-13 DIAGNOSIS — Z888 Allergy status to other drugs, medicaments and biological substances status: Secondary | ICD-10-CM | POA: Diagnosis not present

## 2017-12-13 DIAGNOSIS — E11621 Type 2 diabetes mellitus with foot ulcer: Secondary | ICD-10-CM | POA: Diagnosis not present

## 2017-12-13 DIAGNOSIS — Z1211 Encounter for screening for malignant neoplasm of colon: Secondary | ICD-10-CM | POA: Diagnosis not present

## 2017-12-13 DIAGNOSIS — M47816 Spondylosis without myelopathy or radiculopathy, lumbar region: Secondary | ICD-10-CM

## 2017-12-13 DIAGNOSIS — M549 Dorsalgia, unspecified: Secondary | ICD-10-CM | POA: Insufficient documentation

## 2017-12-13 DIAGNOSIS — M7989 Other specified soft tissue disorders: Secondary | ICD-10-CM | POA: Insufficient documentation

## 2017-12-13 DIAGNOSIS — J449 Chronic obstructive pulmonary disease, unspecified: Secondary | ICD-10-CM | POA: Diagnosis not present

## 2017-12-13 DIAGNOSIS — E785 Hyperlipidemia, unspecified: Secondary | ICD-10-CM | POA: Diagnosis not present

## 2017-12-13 DIAGNOSIS — G894 Chronic pain syndrome: Secondary | ICD-10-CM | POA: Diagnosis not present

## 2017-12-13 DIAGNOSIS — I10 Essential (primary) hypertension: Secondary | ICD-10-CM | POA: Insufficient documentation

## 2017-12-13 DIAGNOSIS — M5441 Lumbago with sciatica, right side: Secondary | ICD-10-CM

## 2017-12-13 DIAGNOSIS — Z79899 Other long term (current) drug therapy: Secondary | ICD-10-CM | POA: Diagnosis not present

## 2017-12-13 DIAGNOSIS — M5442 Lumbago with sciatica, left side: Secondary | ICD-10-CM

## 2017-12-13 DIAGNOSIS — M79604 Pain in right leg: Secondary | ICD-10-CM | POA: Diagnosis not present

## 2017-12-13 DIAGNOSIS — Z8711 Personal history of peptic ulcer disease: Secondary | ICD-10-CM | POA: Insufficient documentation

## 2017-12-13 DIAGNOSIS — G8929 Other chronic pain: Secondary | ICD-10-CM | POA: Diagnosis not present

## 2017-12-13 DIAGNOSIS — M545 Low back pain: Secondary | ICD-10-CM | POA: Insufficient documentation

## 2017-12-13 NOTE — Patient Instructions (Addendum)
____________________________________________________________________________________________  Preparing for Procedure with Sedation  Instructions: . Oral Intake: Do not eat or drink anything for at least 8 hours prior to your procedure. . Transportation: Public transportation is not allowed. Bring an adult driver. The driver must be physically present in our waiting room before any procedure can be started. . Physical Assistance: Bring an adult physically capable of assisting you, in the event you need help. This adult should keep you company at home for at least 6 hours after the procedure. . Blood Pressure Medicine: Take your blood pressure medicine with a sip of water the morning of the procedure. . Blood thinners: Notify our staff if you are taking any blood thinners. Depending on which one you take, there will be specific instructions on how and when to stop it. . Diabetics on insulin: Notify the staff so that you can be scheduled 1st case in the morning. If your diabetes requires high dose insulin, take only  of your normal insulin dose the morning of the procedure and notify the staff that you have done so. . Preventing infections: Shower with an antibacterial soap the morning of your procedure. . Build-up your immune system: Take 1000 mg of Vitamin C with every meal (3 times a day) the day prior to your procedure. . Antibiotics: Inform the staff if you have a condition or reason that requires you to take antibiotics before dental procedures. . Pregnancy: If you are pregnant, call and cancel the procedure. . Sickness: If you have a cold, fever, or any active infections, call and cancel the procedure. . Arrival: You must be in the facility at least 30 minutes prior to your scheduled procedure. . Children: Do not bring children with you. . Dress appropriately: Bring dark clothing that you would not mind if they get stained. . Valuables: Do not bring any jewelry or valuables.  Procedure  appointments are reserved for interventional treatments only. . No Prescription Refills. . No medication changes will be discussed during procedure appointments. . No disability issues will be discussed.  Reasons to call and reschedule or cancel your procedure: (Following these recommendations will minimize the risk of a serious complication.) . Surgeries: Avoid having procedures within 2 weeks of any surgery. (Avoid for 2 weeks before or after any surgery). . Flu Shots: Avoid having procedures within 2 weeks of a flu shots or . (Avoid for 2 weeks before or after immunizations). . Barium: Avoid having a procedure within 7-10 days after having had a radiological study involving the use of radiological contrast. (Myelograms, Barium swallow or enema study). . Heart attacks: Avoid any elective procedures or surgeries for the initial 6 months after a "Myocardial Infarction" (Heart Attack). . Blood thinners: It is imperative that you stop these medications before procedures. Let us know if you if you take any blood thinner.  . Infection: Avoid procedures during or within two weeks of an infection (including chest colds or gastrointestinal problems). Symptoms associated with infections include: Localized redness, fever, chills, night sweats or profuse sweating, burning sensation when voiding, cough, congestion, stuffiness, runny nose, sore throat, diarrhea, nausea, vomiting, cold or Flu symptoms, recent or current infections. It is specially important if the infection is over the area that we intend to treat. . Heart and lung problems: Symptoms that may suggest an active cardiopulmonary problem include: cough, chest pain, breathing difficulties or shortness of breath, dizziness, ankle swelling, uncontrolled high or unusually low blood pressure, and/or palpitations. If you are experiencing any of these symptoms, cancel   your procedure and contact your primary care physician for an evaluation.  Remember:   Regular Business hours are:  Monday to Thursday 8:00 AM to 4:00 PM  Provider's Schedule: Milinda Pointer, MD:  Procedure days: Tuesday and Thursday 7:30 AM to 4:00 PM  Gillis Santa, MD:  Procedure days: Monday and Wednesday 7:30 AM to 4:00 PM ____________________________________________________________________________________________   ____________________________________________________________________________________________  Weight Management Required  URGENT: Your weight has been found to be adversely affecting your health.  Dear [name]:  Your current Body mass index is 46.22 kg/m.Marland Kitchen Estimated body mass index is 46.22 kg/m as calculated from the following:   Height as of this encounter: 5\' 9"  (1.753 m).   Weight as of this encounter: 313 lb (142 kg).  Your last four (4) weight and BMI calculations are as follows: Wt Readings from Last 4 Encounters:  12/13/17 (!) 313 lb (142 kg)  12/08/17 (!) 316 lb (143.3 kg)  12/03/17 (!) 310 lb (140.6 kg)  11/26/17 (!) 313 lb (142 kg)   BMI Readings from Last 4 Encounters:  12/13/17 46.22 kg/m  12/08/17 46.67 kg/m  12/03/17 45.78 kg/m  11/26/17 44.28 kg/m    Calculations estimate your ideal body weight to be: Ideal body weight: 70.7 kg (155 lb 13.8 oz) Adjusted ideal body weight: 99.2 kg (218 lb 11.5 oz)  Please use the table below to identify your weight category and associated incidence of chronic pain, secondary to your weight.  BMI interpretation table: BMI level Category Associated incidence of chronic pain  <18 kg/m2 Underweight   18.5-24.9 kg/m2 Ideal body weight   25-29.9 kg/m2 Overweight  20%  30-34.9 kg/m2 Obese (Class I)  68%  35-39.9 kg/m2 Severe obesity (Class II)  136%  >40 kg/m2 Extreme obesity (Class III)  254%   In addition: You will be considered "Morbidly Obese", if your BMI is above 30 and you have one or more of the following conditions that are directly associated with obesity: 1. Type 2  Diabetes (Which in turn can lead to cardiovascular diseases (CVD), stroke, peripheral vascular diseases (PVD), retinopathy, nephropathy, and neuropathy) 2. Cardiovascular Disease  3. Breathing problems (Asthma, obesity-hypoventilation syndrome, obstructive sleep apnea, chronic inflammatory airway disease) 4. Chronic kidney disease 5. Liver disease (nonalcoholic fatty liver disease) 6. High blood pressure 7. Acid reflux (gastroesophageal reflux disease) 8. Osteoarthritis (OA) 9. Low back pain (Lumbar Facet Syndrome) 10. Hip pain (Osteoarthritis of hip) 11. Knee pain (Osteoarthritis of knee) (patients with a BMI>30 kg/m2 were 6.8 times more likely to develop knee OA than normal-weight individuals) 12. Certain types of cancer. (Epidemiological studies have shown that obesity is a risk factor for: post-menopausal breast cancer; cancers of the endometrium, colon and kidney; malignant adenomas of the oesophagus. Obese subjects have an approximately 1.5-3.5-fold increased risk of developing these cancers compared with normal-weight subjects, and it has been estimated that between 15 and 45% of these cancers can be attributed to overweight. More recent studies suggest that obesity may also increase the risk of other types of cancer, including pancreatic, hepatic and gallbladder cancer. Ref: Obesity and cancer. Pischon T, Nthlings U, [employer] H. Proc Nutr Soc. 2008 May;67(2):128-45. doi: 54.4920/[id number].)  Recommendation: At this point it is urgent that you take a step back and concentrate in loosing weight. Because most chronic pain patients do have difficulty exercising secondary to their pain, you must rely on proper nutrition and dieting in order to lose the weight. If your BMI is above 40, you should seriously consider bariatric surgery. A  realistic goal is to lose 10% of your body weight over a period of 12 months.  If over time you have unsuccessfully try to lose weight, then it is time for you  to seek professional help and to enter a medically supervised weight management program.  Pain management considerations:  1. Pharmacological Problems: Be advised that the use of opioid analgesics has been associated with decreased metabolism and weight gain.  For this reason, should we see that you are unable to lose weight while taking these medications, it may become necessary for [place] to taper down and indefinitely discontinue these medicines.  2. Technical Problems: The incidence of successful interventional therapies decreases as the patient's BMI increases. It is much more difficult to accomplish a safe and effective interventional therapy on a patient with a BMI above 35. Yours is Body mass index is 46.22 kg/m.Marland Kitchen 3. Radiation Exposure Problems: The x-rays machine, used to accomplish injection therapies, will automatically increase their x-ray output in order to capture an appropriate bone image. This means that radiation exposure increases exponentially with the patient's BMI. (The higher the BMI, the higher the radiation exposure.) Although the level of radiation used at a given time is still safe to the patient, it is not for the physician and/or assisting staff. Unfortunately, radiation exposure is accumulative. Because physicians and the staff have to do procedures and be exposed on a daily basis, this can result in health problems such as cancer and radiation burns. Radiation exposure to the staff is monitored by the radiation batches that they wear. The exposure levels are reported back to the staff on a quarterly basis. Depending on levels of exposure, physicians and staff may be obligated by law to decrease this exposure. This means that they have the right and obligation to refuse providing therapies where they may be overexposed to radiation. For this reason, physicians may decline to offer therapies such as radiofrequency ablation or implants to patients with a BMI above  40. ____________________________________________________________________________________________

## 2017-12-17 ENCOUNTER — Encounter (INDEPENDENT_AMBULATORY_CARE_PROVIDER_SITE_OTHER): Payer: Self-pay

## 2017-12-17 ENCOUNTER — Ambulatory Visit (INDEPENDENT_AMBULATORY_CARE_PROVIDER_SITE_OTHER): Payer: Medicaid Other | Admitting: Nurse Practitioner

## 2017-12-17 VITALS — BP 150/95 | HR 97 | Resp 17 | Ht 69.0 in | Wt 311.8 lb

## 2017-12-17 DIAGNOSIS — L97211 Non-pressure chronic ulcer of right calf limited to breakdown of skin: Secondary | ICD-10-CM

## 2017-12-17 NOTE — Progress Notes (Signed)
History of Present Illness  There is no documented history at this time  Assessments & Plan   There are no diagnoses linked to this encounter.    Additional instructions  Subjective:  Patient presents with venous ulcer of the Right lower extremity.    Procedure:  3 layer unna wrap was placed Right lower extremity.   Plan:   Follow up in one week.   

## 2017-12-20 ENCOUNTER — Encounter (INDEPENDENT_AMBULATORY_CARE_PROVIDER_SITE_OTHER): Payer: Self-pay | Admitting: Nurse Practitioner

## 2017-12-23 ENCOUNTER — Ambulatory Visit: Payer: Medicaid Other | Admitting: Pain Medicine

## 2017-12-23 ENCOUNTER — Encounter: Payer: Self-pay | Admitting: Pain Medicine

## 2017-12-23 ENCOUNTER — Other Ambulatory Visit: Payer: Self-pay

## 2017-12-23 ENCOUNTER — Ambulatory Visit
Admission: RE | Admit: 2017-12-23 | Discharge: 2017-12-23 | Disposition: A | Discharge status: Home / Self Care | Payer: Medicaid Other | Source: Ambulatory Visit | Attending: Pain Medicine | Admitting: Pain Medicine

## 2017-12-23 VITALS — BP 191/110 | HR 85 | Temp 98.7°F | Resp 16 | Ht 69.0 in | Wt 313.0 lb

## 2017-12-23 DIAGNOSIS — G8929 Other chronic pain: Secondary | ICD-10-CM | POA: Diagnosis present

## 2017-12-23 DIAGNOSIS — M5136 Other intervertebral disc degeneration, lumbar region: Secondary | ICD-10-CM | POA: Diagnosis present

## 2017-12-23 DIAGNOSIS — M5442 Lumbago with sciatica, left side: Secondary | ICD-10-CM | POA: Insufficient documentation

## 2017-12-23 DIAGNOSIS — M5441 Lumbago with sciatica, right side: Secondary | ICD-10-CM

## 2017-12-23 DIAGNOSIS — M47816 Spondylosis without myelopathy or radiculopathy, lumbar region: Secondary | ICD-10-CM

## 2017-12-23 MED ORDER — FENTANYL CITRATE (PF) 100 MCG/2ML IJ SOLN
25.0000 ug | INTRAMUSCULAR | Status: DC | PRN
  Administered 2017-12-23: 50 ug via INTRAVENOUS
  Filled 2017-12-23: qty 2

## 2017-12-23 MED ORDER — LIDOCAINE HCL 2 % IJ SOLN
20.0000 mL | Freq: Once | INTRAMUSCULAR | Status: AC
  Administered 2017-12-23: 400 mg
  Filled 2017-12-23: qty 40

## 2017-12-23 MED ORDER — TRIAMCINOLONE ACETONIDE 40 MG/ML IJ SUSP
80.0000 mg | Freq: Once | INTRAMUSCULAR | Status: AC
  Administered 2017-12-23: 40 mg
  Filled 2017-12-23: qty 2

## 2017-12-23 MED ORDER — MIDAZOLAM HCL 5 MG/5ML IJ SOLN
1.0000 mg | INTRAMUSCULAR | Status: DC | PRN
  Administered 2017-12-23: 3 mg via INTRAVENOUS
  Filled 2017-12-23: qty 5

## 2017-12-23 MED ORDER — LACTATED RINGERS IV SOLN
1000.0000 mL | Freq: Once | INTRAVENOUS | Status: AC
  Administered 2017-12-23: 1000 mL via INTRAVENOUS

## 2017-12-23 MED ORDER — ROPIVACAINE HCL 2 MG/ML IJ SOLN
18.0000 mL | Freq: Once | INTRAMUSCULAR | Status: AC
  Administered 2017-12-23: 18 mL via PERINEURAL
  Filled 2017-12-23: qty 20

## 2017-12-23 NOTE — Progress Notes (Signed)
Patient's Name: [name]  MRN: [id number]  Referring Provider: Theotis Burrow*  DOB: 1965-01-27  PCP: Theotis Burrow, MD  DOS: 12/23/2017  Note by: Gaspar Cola, MD  Service setting: Ambulatory outpatient  Specialty: Interventional Pain Management  Patient type: Established  Location: [facility] (AMB) Pain Management Facility  Visit type: Interventional Procedure   Primary Reason for Visit: Interventional Pain Management Treatment. CC: Back Pain (mid center)  Procedure:          Anesthesia, Analgesia, Anxiolysis:  Type: Lumbar Facet, Medial Branch Block(s) #2  Primary Purpose: Diagnostic Region: Posterolateral Lumbosacral Spine Level: L2, L3, L4, L5, & S1 Medial Branch Level(s). Injecting these levels blocks the L3-4, L4-5, and L5-S1 lumbar facet joints. Laterality: Bilateral  Type: Moderate (Conscious) Sedation combined with Local Anesthesia Indication(s): Analgesia and Anxiety Route: Intravenous (IV) IV Access: Secured Sedation: Meaningful verbal contact was maintained at all times during the procedure  Local Anesthetic: Lidocaine 1-2%  Position: Prone   Indications: 1. Spondylosis without myelopathy or radiculopathy, lumbar region   2. Lumbar facet syndrome (Bilateral) (R>L)   3. Lumbar facet hypertrophy (Bilateral)   4. Lumbar spondylosis   5. DDD (degenerative disc disease), lumbar   6. Chronic low back pain (Primary Area of Pain) (Bilateral) (R>L) w/ sciatica (Bilateral)    Pain Score: Pre-procedure: 4 /10 Post-procedure: 0-No pain/10  Pre-op Assessment:  [name] is a 52 y.o. (year old), male patient, seen today for interventional treatment. He  has a past surgical history that includes Esophagogastroduodenoscopy (egd) with propofol (N/A, 11/09/2016); Colonoscopy with propofol (N/A, 11/09/2016); Colonoscopy with propofol (N/A, 07/06/2017); and Esophagogastroduodenoscopy (egd) with propofol (N/A, 07/06/2017). [name] has a current medication list  which includes the following prescription(s): albuterol, atorvastatin, bupropion, gnp calcium 1200, vitamin d3, fluticasone-salmeterol, furosemide, hydrochlorothiazide, loratadine, losartan, magnesium, metformin, oxybutynin, and pantoprazole, and the following Facility-Administered Medications: fentanyl and midazolam. His primarily concern today is the Back Pain (mid center)  Initial Vital Signs:  Pulse/HCG Rate: 95ECG Heart Rate: 85 Temp: 98.5 F (36.9 C) Resp: 18 BP: (!) 161/87 SpO2: 98 %  BMI: Estimated body mass index is 46.22 kg/m as calculated from the following:   Height as of this encounter: 5\' 9"  (1.753 m).   Weight as of this encounter: 313 lb (142 kg).  Risk Assessment: Allergies: Reviewed. He is allergic to shellfish allergy; codeine; and lisinopril.  Allergy Precautions: None required Coagulopathies: Reviewed. None identified.  Blood-thinner therapy: None at this time Active Infection(s): Reviewed. None identified. [name] is afebrile  Site Confirmation: [name] was asked to confirm the procedure and laterality before marking the site Procedure checklist: Completed Consent: Before the procedure and under the influence of no sedative(s), amnesic(s), or anxiolytics, the patient was informed of the treatment options, risks and possible complications. To fulfill our ethical and legal obligations, as recommended by the American Medical Association's Code of Ethics, I have informed the patient of my clinical impression; the nature and purpose of the treatment or procedure; the risks, benefits, and possible complications of the intervention; the alternatives, including doing nothing; the risk(s) and benefit(s) of the alternative treatment(s) or procedure(s); and the risk(s) and benefit(s) of doing nothing. The patient was provided information about the general risks and possible complications associated with the procedure. These may include, but are not limited to: failure to  achieve desired goals, infection, bleeding, organ or nerve damage, allergic reactions, paralysis, and death. In addition, the patient was informed of those risks and complications associated to [facility]  procedures, such as failure to decrease pain; infection (i.e.: Meningitis, epidural or intraspinal abscess); bleeding (i.e.: epidural hematoma, subarachnoid hemorrhage, or any other type of intraspinal or peri-dural bleeding); organ or nerve damage (i.e.: Any type of peripheral nerve, nerve root, or spinal cord injury) with subsequent damage to sensory, motor, and/or autonomic systems, resulting in permanent pain, numbness, and/or weakness of one or several areas of the body; allergic reactions; (i.e.: anaphylactic reaction); and/or death. Furthermore, the patient was informed of those risks and complications associated with the medications. These include, but are not limited to: allergic reactions (i.e.: anaphylactic or anaphylactoid reaction(s)); adrenal axis suppression; blood sugar elevation that in diabetics may result in ketoacidosis or comma; water retention that in patients with history of congestive heart failure may result in shortness of breath, pulmonary edema, and decompensation with resultant heart failure; weight gain; swelling or edema; medication-induced neural toxicity; particulate matter embolism and blood vessel occlusion with resultant organ, and/or nervous system infarction; and/or aseptic necrosis of one or more joints. Finally, the patient was informed that Medicine is not an exact science; therefore, there is also the possibility of unforeseen or unpredictable risks and/or possible complications that may result in a catastrophic outcome. The patient indicated having understood very clearly. We have given the patient no guarantees and we have made no promises. Enough time was given to the patient to ask questions, all of which were answered to the patient's satisfaction. [name] has  indicated that he wanted to continue with the procedure. Attestation: I, the ordering provider, attest that I have discussed with the patient the benefits, risks, side-effects, alternatives, likelihood of achieving goals, and potential problems during recovery for the procedure that I have provided informed consent. Date  Time: 12/23/2017 12:57 PM  Pre-Procedure Preparation:  Monitoring: As per clinic protocol. Respiration, ETCO2, SpO2, BP, heart rate and rhythm monitor placed and checked for adequate function Safety Precautions: Patient was assessed for positional comfort and pressure points before starting the procedure. Time-out: I initiated and conducted the "Time-out" before starting the procedure, as per protocol. The patient was asked to participate by confirming the accuracy of the "Time Out" information. Verification of the correct person, site, and procedure were performed and confirmed by me, the nursing staff, and the patient. "Time-out" conducted as per Joint Commission's Universal Protocol (UP.01.01.01). Time: 1427  Description of Procedure:          Laterality: Bilateral. The procedure was performed in identical fashion on both sides. Levels:  L2, L3, L4, L5, & S1 Medial Branch Level(s) Area Prepped: Posterior Lumbosacral Region Prepping solution: ChloraPrep (2% chlorhexidine gluconate and 70% isopropyl alcohol) Safety Precautions: Aspiration looking for blood return was conducted prior to all injections. At no point did we inject any substances, as a needle was being advanced. Before injecting, the patient was told to immediately notify me if he was experiencing any new onset of "ringing in the ears, or metallic taste in the mouth". No attempts were made at seeking any paresthesias. Safe injection practices and needle disposal techniques used. Medications properly checked for expiration dates. SDV (single dose vial) medications used. After the completion of the procedure, all  disposable equipment used was discarded in the proper designated medical waste containers. Local Anesthesia: Protocol guidelines were followed. The patient was positioned over the fluoroscopy table. The area was prepped in the usual manner. The time-out was completed. The target area was identified using fluoroscopy. A 12-in long, straight, sterile hemostat was used with fluoroscopic guidance to locate  the targets for each level blocked. Once located, the skin was marked with an approved surgical skin marker. Once all sites were marked, the skin (epidermis, dermis, and hypodermis), as well as deeper tissues (fat, connective tissue and muscle) were infiltrated with a small amount of a short-acting local anesthetic, loaded on a 10cc syringe with a 25G, 1.5-in  Needle. An appropriate amount of time was allowed for local anesthetics to take effect before proceeding to the next step. Local Anesthetic: Lidocaine 2.0% The unused portion of the local anesthetic was discarded in the proper designated containers. Technical explanation of process:  L2 Medial Branch Nerve Block (MBB): The target area for the L2 medial branch is at the junction of the postero-lateral aspect of the superior articular process and the superior, posterior, and medial edge of the transverse process of L3. Under fluoroscopic guidance, a Quincke needle was inserted until contact was made with os over the superior postero-lateral aspect of the pedicular shadow (target area). After negative aspiration for blood, 0.5 mL of the nerve block solution was injected without difficulty or complication. The needle was removed intact. L3 Medial Branch Nerve Block (MBB): The target area for the L3 medial branch is at the junction of the postero-lateral aspect of the superior articular process and the superior, posterior, and medial edge of the transverse process of L4. Under fluoroscopic guidance, a Quincke needle was inserted until contact was made with os  over the superior postero-lateral aspect of the pedicular shadow (target area). After negative aspiration for blood, 0.5 mL of the nerve block solution was injected without difficulty or complication. The needle was removed intact. L4 Medial Branch Nerve Block (MBB): The target area for the L4 medial branch is at the junction of the postero-lateral aspect of the superior articular process and the superior, posterior, and medial edge of the transverse process of L5. Under fluoroscopic guidance, a Quincke needle was inserted until contact was made with os over the superior postero-lateral aspect of the pedicular shadow (target area). After negative aspiration for blood, 0.5 mL of the nerve block solution was injected without difficulty or complication. The needle was removed intact. L5 Medial Branch Nerve Block (MBB): The target area for the L5 medial branch is at the junction of the postero-lateral aspect of the superior articular process and the superior, posterior, and medial edge of the sacral ala. Under fluoroscopic guidance, a Quincke needle was inserted until contact was made with os over the superior postero-lateral aspect of the pedicular shadow (target area). After negative aspiration for blood, 0.5 mL of the nerve block solution was injected without difficulty or complication. The needle was removed intact. S1 Medial Branch Nerve Block (MBB): The target area for the S1 medial branch is at the posterior and inferior 6 o'clock position of the L5-S1 facet joint. Under fluoroscopic guidance, the Quincke needle inserted for the L5 MBB was redirected until contact was made with os over the inferior and postero aspect of the sacrum, at the 6 o' clock position under the L5-S1 facet joint (Target area). After negative aspiration for blood, 0.5 mL of the nerve block solution was injected without difficulty or complication. The needle was removed intact. Procedural Needles: 22-gauge, 3.5-inch, Quincke needles used  for all levels. Nerve block solution: 0.2% PF-Ropivacaine + Triamcinolone (40 mg/mL) diluted to a final concentration of 4 mg of Triamcinolone/mL of Ropivacaine The unused portion of the solution was discarded in the proper designated containers.  Once the entire procedure was completed, the treated  area was cleaned, making sure to leave some of the prepping solution back to take advantage of its long term bactericidal properties.   Illustration of the posterior view of the lumbar spine and the posterior neural structures. Laminae of L2 through S1 are labeled. DPRL5, dorsal primary ramus of L5; DPRS1, dorsal primary ramus of S1; DPR3, dorsal primary ramus of L3; FJ, facet (zygapophyseal) joint L3-L4; I, inferior articular process of L4; LB1, lateral branch of dorsal primary ramus of L1; IAB, inferior articular branches from L3 medial branch (supplies L4-L5 facet joint); IBP, intermediate branch plexus; MB3, medial branch of dorsal primary ramus of L3; NR3, third lumbar nerve root; S, superior articular process of L5; SAB, superior articular branches from L4 (supplies L4-5 facet joint also); TP3, transverse process of L3.  Vitals:   12/23/17 1435 12/23/17 1445 12/23/17 1455 12/23/17 1500  BP: (!) 144/77 (!) 187/106 (!) 186/110 (!) 191/110  Pulse: 91 85    Resp: 16 16 16 16   Temp:  98 F (36.7 C)  98.7 F (37.1 C)  SpO2: 95% 94% 94% 96%  Weight:      Height:         Start Time: 1437 hrs. End Time: 1435 hrs.  Imaging Guidance (Spinal):          Type of Imaging Technique: Fluoroscopy Guidance (Spinal) Indication(s): Assistance in needle guidance and placement for procedures requiring needle placement in or near specific anatomical locations not easily accessible without such assistance. Exposure Time: Please see nurses notes. Contrast: None used. Fluoroscopic Guidance: I was personally present during the use of fluoroscopy. "Tunnel Vision Technique" used to obtain the best possible view of  the target area. Parallax error corrected before commencing the procedure. "Direction-depth-direction" technique used to introduce the needle under continuous pulsed fluoroscopy. Once target was reached, antero-posterior, oblique, and lateral fluoroscopic projection used confirm needle placement in all planes. Images permanently stored in EMR. Interpretation: No contrast injected. I personally interpreted the imaging intraoperatively. Adequate needle placement confirmed in multiple planes. Permanent images saved into the patient's record.  Antibiotic Prophylaxis:   Anti-infectives (From admission, onward)   None     Indication(s): None identified  Post-operative Assessment:  Post-procedure Vital Signs:  Pulse/HCG Rate: 8580 Temp: 98.7 F (37.1 C) Resp: 16 BP: (!) 191/110 SpO2: 96 %  EBL: None  Complications: No immediate post-treatment complications observed by team, or reported by patient.  Note: The patient tolerated the entire procedure well. A repeat set of vitals were taken after the procedure and the patient was kept under observation following institutional policy, for this type of procedure. Post-procedural neurological assessment was performed, showing return to baseline, prior to discharge. The patient was provided with post-procedure discharge instructions, including a section on how to identify potential problems. Should any problems arise concerning this procedure, the patient was given instructions to immediately contact us, at any time, without hesitation. In any case, we plan to contact the patient by telephone for a follow-up status report regarding this interventional procedure.  Comments:  No additional relevant information.  Plan of Care    Imaging Orders     DG C-Arm 1-60 Min-No Report  Procedure Orders     LUMBAR FACET(MEDIAL BRANCH NERVE BLOCK) MBNB  Medications ordered for procedure: Meds ordered this encounter  Medications  . lidocaine (XYLOCAINE) 2 %  (with pres) injection 400 mg  . midazolam (VERSED) 5 MG/5ML injection 1-2 mg    Make sure Flumazenil is available in the pyxis when using  this medication. If oversedation occurs, administer 0.2 mg IV over 15 sec. If after 45 sec no response, administer 0.2 mg again over 1 min; may repeat at 1 min intervals; not to exceed 4 doses (1 mg)  . fentaNYL (SUBLIMAZE) injection 25-50 mcg    Make sure Narcan is available in the pyxis when using this medication. In the event of respiratory depression (RR< 8/min): Titrate NARCAN (naloxone) in increments of 0.1 to 0.2 mg IV at 2-3 minute intervals, until desired degree of reversal.  . lactated ringers infusion 1,000 mL  . ropivacaine (PF) 2 mg/mL (0.2%) (NAROPIN) injection 18 mL  . triamcinolone acetonide (KENALOG-40) injection 80 mg   Medications administered: We administered lidocaine, midazolam, fentaNYL, lactated ringers, ropivacaine (PF) 2 mg/mL (0.2%), and triamcinolone acetonide.  See the medical record for exact dosing, route, and time of administration.  Disposition: Discharge home  Discharge Date & Time: 12/23/2017; 1507 hrs.   Physician-requested Follow-up: Return for post-procedure eval (2 wks), w/ Dr. Dossie Arbour.  Future Appointments  Date Time Provider Pablo  12/24/2017  9:15 AM Kris Hartmann, NP [facility] None  01/19/2018 11:15 AM Milinda Pointer, MD [facility] None   Primary Care Physician: Theotis Burrow, MD Location: [facility] Facility Note by: Gaspar Cola, MD Date: 12/23/2017; Time: 5:01 PM  Disclaimer:  Medicine is not an Chief Strategy Officer. The only guarantee in medicine is that nothing is guaranteed. It is important to note that the decision to proceed with this intervention was based on the information collected from the patient. The Data and conclusions were drawn from the patient's questionnaire, the interview, and the physical examination. Because the information was provided in  large part by the patient, it cannot be guaranteed that it has not been purposely or unconsciously manipulated. Every effort has been made to obtain as much relevant data as possible for this evaluation. It is important to note that the conclusions that lead to this procedure are derived in large part from the available data. Always take into account that the treatment will also be dependent on availability of resources and existing treatment guidelines, considered by other Pain Management Practitioners as being common knowledge and practice, at the time of the intervention. For Medico-Legal purposes, it is also important to point out that variation in procedural techniques and pharmacological choices are the acceptable norm. The indications, contraindications, technique, and results of the above procedure should only be interpreted and judged by a Board-Certified Interventional Pain Specialist with extensive familiarity and expertise in the same exact procedure and technique.

## 2017-12-23 NOTE — Patient Instructions (Signed)

## 2017-12-24 ENCOUNTER — Ambulatory Visit (INDEPENDENT_AMBULATORY_CARE_PROVIDER_SITE_OTHER): Payer: Medicaid Other | Admitting: Vascular Surgery

## 2017-12-24 ENCOUNTER — Telehealth: Payer: Self-pay | Admitting: *Deleted

## 2017-12-24 ENCOUNTER — Encounter (INDEPENDENT_AMBULATORY_CARE_PROVIDER_SITE_OTHER): Payer: Self-pay | Admitting: Nurse Practitioner

## 2017-12-24 VITALS — BP 174/95 | HR 97 | Resp 20 | Ht 69.0 in | Wt 311.6 lb

## 2017-12-24 DIAGNOSIS — L97211 Non-pressure chronic ulcer of right calf limited to breakdown of skin: Secondary | ICD-10-CM | POA: Diagnosis not present

## 2017-12-24 DIAGNOSIS — I890 Lymphedema, not elsewhere classified: Secondary | ICD-10-CM

## 2017-12-24 DIAGNOSIS — Z6841 Body Mass Index (BMI) 40.0 and over, adult: Secondary | ICD-10-CM | POA: Diagnosis not present

## 2017-12-24 DIAGNOSIS — F17210 Nicotine dependence, cigarettes, uncomplicated: Secondary | ICD-10-CM

## 2017-12-24 NOTE — Telephone Encounter (Signed)
Spoke with patient, does report soreness at the site but denies any other questions or concerns.

## 2017-12-24 NOTE — Progress Notes (Signed)
MRN : [id number]  [name] is a 52 y.o. ([birth year]) male who presents with chief complaint of  Chief Complaint  Patient presents with  . Follow-up    unna boot check  .  History of Present Illness: Patient returns today in follow up of leg swelling.  His leg swelling has improved with several weeks of Unna boots on the right leg.  He still has some scabs on the lateral aspect of the right lower leg, but no current weeping or drainage as he had before.  The skin is better but not entirely healed.  He says that he will not be able to wear compression stockings in the near future on this right leg due to the bone spur in his foot.  He complains of a lot of pain.  Current Outpatient Medications  Medication Sig Dispense Refill  . albuterol (VENTOLIN HFA) 108 (90 Base) MCG/ACT inhaler Inhale 2 puffs into the lungs every 6 (six) hours as needed for wheezing or shortness of breath.    Marland Kitchen buPROPion (WELLBUTRIN SR) 150 MG 12 hr tablet Take 150 mg by mouth 2 (two) times daily.    . Calcium Carbonate-Vit D-Min (GNP CALCIUM 1200) 1200-1000 MG-UNIT CHEW Chew 1,200 mg by mouth daily with breakfast. Take in combination with vitamin D and magnesium. 30 tablet 5  . Cholecalciferol (VITAMIN D3) 5000 units CAPS Take 1 capsule (5,000 Units total) by mouth daily with breakfast. Take along with calcium and magnesium. 30 capsule 5  . Fluticasone-Salmeterol (ADVAIR DISKUS) 100-50 MCG/DOSE AEPB Inhale 1 puff into the lungs 2 (two) times daily.     . furosemide (LASIX) 40 MG tablet Take 1 tablet (40 mg total) by mouth daily. 30 tablet 0  . loratadine (CLARITIN) 10 MG tablet Take 10 mg by mouth daily.    Marland Kitchen losartan (COZAAR) 50 MG tablet Take 1 tablet (50 mg total) by mouth daily. 30 tablet 0  . Magnesium 500 MG CAPS Take 1 capsule (500 mg total) by mouth 2 (two) times daily at 8 am and 10 pm. 60 capsule 5  . metFORMIN (GLUCOPHAGE) 1000 MG tablet Take 1,000 mg by mouth 2 (two) times daily with a meal.    .  oxybutynin (DITROPAN) 5 MG tablet Take 5 mg by mouth every 12 (twelve) hours.    . pantoprazole (PROTONIX) 40 MG tablet Take 40 mg by mouth 2 (two) times daily.     Marland Kitchen atorvastatin (LIPITOR) 20 MG tablet Take 20 mg by mouth at bedtime.     . hydrochlorothiazide (MICROZIDE) 12.5 MG capsule Take 12.5 mg by mouth daily.   0   No current facility-administered medications for this visit.     Past Medical History:  Diagnosis Date  . COPD (chronic obstructive pulmonary disease) ([place])   . Coronary artery disease   . Diabetes mellitus without complication ([place])   . Hypertension   . Peptic ulcer   . Sleep apnea     Past Surgical History:  Procedure Laterality Date  . COLONOSCOPY WITH PROPOFOL N/A 11/09/2016   Procedure: COLONOSCOPY WITH PROPOFOL;  Surgeon: Lollie Sails, MD;  Location: [facility] ENDOSCOPY;  Service: Endoscopy;  Laterality: N/A;  . COLONOSCOPY WITH PROPOFOL N/A 07/06/2017   Procedure: COLONOSCOPY WITH PROPOFOL;  Surgeon: Lollie Sails, MD;  Location[facility] ENDOSCOPY;  Service: Endoscopy;  Laterality: N/A;  . ESOPHAGOGASTRODUODENOSCOPY (EGD) WITH PROPOFOL N/A 11/09/2016   Procedure: ESOPHAGOGASTRODUODENOSCOPY (EGD) WITH PROPOFOL;  Surgeon: Lollie Sails, MD;  Location: [facility] ENDOSCOPY;  Service: Endoscopy;  Laterality: N/A;  . ESOPHAGOGASTRODUODENOSCOPY (EGD) WITH PROPOFOL N/A 07/06/2017   Procedure: ESOPHAGOGASTRODUODENOSCOPY (EGD) WITH PROPOFOL;  Surgeon: Lollie Sails, MD;  Location: [facility] ENDOSCOPY;  Service: Endoscopy;  Laterality: N/A;   Family History  Problem Relation Age of Onset  . Diabetes Mother   . Diabetes Father   . Stomach cancer Father   No bleeding or clotting disorders       Social History  Substance Use Topics  . Smoking status: Current Every Day Smoker    Packs/day: 1.00    Years: 30.00    Types: Cigarettes  . Smokeless tobacco: Never Used  . Alcohol use No  No IVDU      Allergies  Allergen Reactions  . Shellfish  Allergy Swelling  . Codeine Rash  . Lisinopril Cough    No current facility-administered medications for this visit.       REVIEW OF SYSTEMS(Negative unless checked)  Constitutional: [] ?Weight loss[] ?Fever[] ?Chills Cardiac:[] ?Chest pain[] ?Chest pressure[] ?Palpitations [] ?Shortness of breath when laying flat [] ?Shortness of breath at rest [x] ?Shortness of breath with exertion. Vascular: [] ?Pain in legs with walking[x] ?Pain in legsat rest[] ?Pain in legs when laying flat [] ?Claudication [] ?Pain in feet when walking [] ?Pain in feet at rest [] ?Pain in feet when laying flat [] ?History of DVT [] ?Phlebitis [x] ?Swelling in legs [] ?Varicose veins [x] ?Non-healing ulcers Pulmonary: [] ?Uses home oxygen [] ?Productive cough[] ?Hemoptysis [] ?Wheeze [x] ?COPD [] ?Asthma Neurologic: [] ?Dizziness [] ?Blackouts [] ?Seizures [] ?History of stroke [] ?History of TIA[] ?Aphasia [] ?Temporary blindness[] ?Dysphagia [] ?Weaknessor numbness in arms [] ?Weakness or numbnessin legs Musculoskeletal: [] ?Arthritis [] ?Joint swelling [x] ?Joint pain [] ?Low back pain Hematologic:[] ?Easy bruising[] ?Easy bleeding [] ?Hypercoagulable state [] ?Anemic [] ?Hepatitis Gastrointestinal:[] ?Blood in stool[] ?Vomiting blood[] ?Gastroesophageal reflux/heartburn[] ?Abdominal pain Genitourinary: [] ?Chronic kidney disease [] ?Difficulturination [] ?Frequenturination [] ?Burning with urination[] ?Hematuria Skin: [] ?Rashes [x] ?Ulcers [x] ?Wounds Psychological: [] ?History of anxiety[] ?History of major depression.     Physical Examination  BP (!) 174/95 (BP Location: Right Arm, Patient Position: Sitting)   Pulse 97   Resp 20   Ht 5' 9"  (1.753 m)   Wt (!) 311 lb 9.6 oz (141.3 kg)   BMI 46.02 kg/m  Gen:  WD/WN, NAD Head: [place]/AT, No temporalis wasting. Ear/Nose/Throat: Hearing grossly intact, nares w/o erythema or drainage Eyes:  Conjunctiva clear. Sclera non-icteric Neck: Supple.  Trachea midline Pulmonary:  Good air movement, no use of accessory muscles.  Cardiac: RRR, no JVD Vascular:  Vessel Right Left  Radial Palpable Palpable                          PT 1+ Palpable 1+Palpable  DP Palpable Palpable    Musculoskeletal: M/S 5/5 throughout.  No deformity or atrophy. 1-2+ RLE edema, 1+ LLE edema. Neurologic: Sensation grossly intact in extremities.  Symmetrical.  Speech is fluent.  Psychiatric: Judgment intact, Mood & affect appropriate for pt's clinical situation. Dermatologic: Marked stasis dermatitis changes are present bilaterally.  Some scabs are still present on the right lateral calf area although they are improved.       Labs Recent Results (from the past 2160 hour(s))  Compliance Drug Analysis, Ur     Status: None   Collection Time: 10/07/17  4:00 PM  Result Value Ref Range   Summary FINAL     Comment: ==================================================================== [facility] COMP DRUG ANALYSIS,UR ==================================================================== Test                             Result       Flag       Units Drug Present and Declared for Prescription  Verification   Bupropion                      PRESENT      EXPECTED   Hydroxybupropion               PRESENT      EXPECTED    Hydroxybupropion is an expected metabolite of bupropion. Drug Present not Declared for Prescription Verification   Acetaminophen                  PRESENT      UNEXPECTED ==================================================================== Test                      Result    Flag   Units      Ref Range   Creatinine              157              mg/dL      >=20 ==================================================================== Declared Medications:  The flagging and interpretation on this report are based on the  following declared medications.  Unexpected results may arise f rom   inaccuracies in the declared medications.  **Note: The testing scope of this panel includes these medications:  Bupropion  **Note: The testing scope of this panel does not include following  reported medications:  Albuterol  Atorvastatin  Fluticasone (Advair)  Furosemide (Lasix)  Hydrochlorothiazide (Microzide)  Loratadine (Claritin)  Losartan (Cozaar)  Metformin  Oxybutynin (Ditropan)  Pantoprazole (Protonix)  Salmeterol (Advair)  Tiotropium (Spiriva) ==================================================================== For clinical consultation, please call [phone]. ====================================================================   Comp. Metabolic Panel (12)     Status: None   Collection Time: 10/07/17  4:04 PM  Result Value Ref Range   Glucose 80 65 - 99 mg/dL   BUN 10 6 - 24 mg/dL   Creatinine, Ser 1.10 0.76 - 1.27 mg/dL   GFR calc non Af Amer 77 >59 mL/min/1.73   GFR calc Af Amer 89 >59 mL/min/1.73   BUN/Creatinine Ratio 9 9 - 20   Sodium 140 134 - 144 mmol/L   Potassium 4.4 3.5 - 5.2 mmol/L   Chloride 98 96 - 106 mmol/L   Calcium 9.9 8.7 - 10.2 mg/dL   Total Protein 6.6 6.0 - 8.5 g/dL   Albumin 4.1 3.5 - 5.5 g/dL   Globulin, Total 2.5 1.5 - 4.5 g/dL   Albumin/Globulin Ratio 1.6 1.2 - 2.2   Bilirubin Total 0.3 0.0 - 1.2 mg/dL   Alkaline Phosphatase 94 39 - 117 IU/L   AST 18 0 - 40 IU/L  Magnesium     Status: None   Collection Time: 10/07/17  4:04 PM  Result Value Ref Range   Magnesium 1.7 1.6 - 2.3 mg/dL  Vitamin B12     Status: None   Collection Time: 10/07/17  4:04 PM  Result Value Ref Range   Vitamin B-12 466 232 - 1,245 pg/mL  Sedimentation rate     Status: None   Collection Time: 10/07/17  4:04 PM  Result Value Ref Range   Sed Rate 23 0 - 30 mm/hr  25-Hydroxyvitamin D Lcms D2+D3     Status: Abnormal   Collection Time: 10/07/17  4:04 PM  Result Value Ref Range   25-Hydroxy, Vitamin D 29 (L) ng/mL    Comment: Reference Range: All Ages:  Target levels 30 - 100    25-Hydroxy, Vitamin D-2 <1.0 ng/mL   25-Hydroxy, Vitamin D-3 29 ng/mL  C-reactive protein     Status: None   Collection Time: 10/07/17  4:04 PM  Result Value Ref Range   CRP 5 0 - 10 mg/L  Comprehensive metabolic panel     Status: Abnormal   Collection Time: 10/15/17  7:53 PM  Result Value Ref Range   Sodium 141 135 - 145 mmol/L   Potassium 4.3 3.5 - 5.1 mmol/L   Chloride 105 98 - 111 mmol/L   CO2 28 22 - 32 mmol/L   Glucose, Bld 100 (H) 70 - 99 mg/dL   BUN 12 6 - 20 mg/dL   Creatinine, Ser 1.04 0.61 - 1.24 mg/dL   Calcium 9.2 8.9 - 10.3 mg/dL   Total Protein 7.2 6.5 - 8.1 g/dL   Albumin 4.0 3.5 - 5.0 g/dL   AST 21 15 - 41 U/L   ALT 23 0 - 44 U/L   Alkaline Phosphatase 85 38 - 126 U/L   Total Bilirubin 0.6 0.3 - 1.2 mg/dL   GFR calc non Af Amer >60 >60 mL/min   GFR calc Af Amer >60 >60 mL/min    Comment: (NOTE) The eGFR has been calculated using the CKD EPI equation. This calculation has not been validated in all clinical situations. eGFR's persistently <60 mL/min signify possible Chronic Kidney Disease.    Anion gap 8 5 - 15    Comment: Performed at [facility], [address] [place], [place] [address]  Ethanol     Status: None   Collection Time: 10/15/17  7:53 PM  Result Value Ref Range   Alcohol, Ethyl (B) <10 <10 mg/dL    Comment: (NOTE) Lowest detectable limit for serum alcohol is 10 mg/dL. For medical purposes only. Performed at [facility], [address] [place], [place] [address]   Salicylate level     Status: None   Collection Time: 10/15/17  7:53 PM  Result Value Ref Range   Salicylate Lvl <7.0 2.8 - 30.0 mg/dL    Comment: Performed at [facility], [address] [place], [place] [address]  Acetaminophen level     Status: Abnormal   Collection Time: 10/15/17  7:53 PM  Result Value Ref Range   Acetaminophen (Tylenol), Serum <10 (L) 10 - 30 ug/mL    Comment: (NOTE) Therapeutic concentrations  vary significantly. A range of 10-30 ug/mL  may be an effective concentration for many patients. However, some  are best treated at concentrations outside of this range. Acetaminophen concentrations >150 ug/mL at 4 hours after ingestion  and >50 ug/mL at 12 hours after ingestion are often associated with  toxic reactions. Performed at [facility], [address] [place], [place] [address]   cbc     Status: None   Collection Time: 10/15/17  7:53 PM  Result Value Ref Range   WBC 8.7 3.8 - 10.6 K/uL   RBC 5.04 4.40 - 5.90 MIL/uL   Hemoglobin 14.6 13.0 - 18.0 g/dL   HCT 43.1 40.0 - 52.0 %   MCV 85.6 80.0 - 100.0 fL   MCH 28.9 26.0 - 34.0 pg   MCHC 33.8 32.0 - 36.0 g/dL   RDW 14.1 11.5 - 14.5 %   Platelets 200 150 - 440 K/uL    Comment: Performed at [facility], [address] [place],  [address]  Glucose, capillary     Status: Abnormal   Collection Time: 10/16/17  8:04 AM  Result Value Ref Range   Glucose-Capillary 114 (H) 70 -  99 mg/dL   Comment 1 Notify RN     Radiology Dg C-arm 1-60 Min-no Report  Result Date: 12/23/2017 Fluoroscopy was utilized by the requesting physician.  No radiographic interpretation.    Assessment/Plan Obesity Discussed that weight loss would be very beneficial for wound healing and leg swelling.  Essential hypertension, benign blood pressure control important in reducing the progression of atherosclerotic disease. On appropriate oral medications.   Hyperlipidemia lipid control important in reducing the progression of atherosclerotic disease. Continue statin therapy  DM2 (diabetes mellitus, type 2) (HCC) blood glucose control important in reducing the progression of atherosclerotic disease. Also, involved in wound healing. On appropriate medications.   Swelling of limb He will continue to wear the compression stocking on the left leg.  We are going to place him in a 3 layer Unna boot on the right leg today  and change this weekly.  This should help get the swelling and skin breakdown under control.  We will reassess him in one to two months.  When we come out of the [employer], it was again stressed he will absolutely need to wear compression stockings going forward.  Lower limb ulcer, calf, right, limited to breakdown of skin (HCC) A 3 layer Unna boot was placed on the right lower extremity today.  This will be changed weekly.    Leotis Pain, MD  12/24/2017 10:09 AM    This note was created with Dragon medical transcription system.  Any errors from dictation are purely unintentional

## 2017-12-31 ENCOUNTER — Ambulatory Visit (INDEPENDENT_AMBULATORY_CARE_PROVIDER_SITE_OTHER): Payer: Medicaid Other | Admitting: Nurse Practitioner

## 2017-12-31 ENCOUNTER — Encounter (INDEPENDENT_AMBULATORY_CARE_PROVIDER_SITE_OTHER): Payer: Self-pay | Admitting: Nurse Practitioner

## 2017-12-31 VITALS — BP 141/87 | HR 105 | Resp 18 | Ht 69.0 in | Wt 313.0 lb

## 2017-12-31 DIAGNOSIS — L97211 Non-pressure chronic ulcer of right calf limited to breakdown of skin: Secondary | ICD-10-CM | POA: Diagnosis not present

## 2017-12-31 NOTE — Progress Notes (Signed)
History of Present Illness  There is no documented history at this time  Assessments & Plan   There are no diagnoses linked to this encounter.    Additional instructions  Subjective:  Patient presents with venous ulcer of the Right lower extremity.    Procedure:  3 layer unna wrap was placed Right lower extremity.   Plan:   Follow up in one week.   

## 2018-01-07 ENCOUNTER — Encounter (INDEPENDENT_AMBULATORY_CARE_PROVIDER_SITE_OTHER): Payer: Self-pay

## 2018-01-07 ENCOUNTER — Ambulatory Visit (INDEPENDENT_AMBULATORY_CARE_PROVIDER_SITE_OTHER): Payer: Medicaid Other | Admitting: Nurse Practitioner

## 2018-01-07 VITALS — BP 147/90 | HR 96 | Resp 16 | Ht 69.0 in | Wt 320.2 lb

## 2018-01-07 DIAGNOSIS — L97211 Non-pressure chronic ulcer of right calf limited to breakdown of skin: Secondary | ICD-10-CM | POA: Diagnosis not present

## 2018-01-07 NOTE — Progress Notes (Signed)
History of Present Illness  There is no documented history at this time  Assessments & Plan   There are no diagnoses linked to this encounter.    Additional instructions  Subjective:  Patient presents with venous ulcer of the Right lower extremity.    Procedure:  3 layer unna wrap was placed Right lower extremity.   Plan:   Follow up in one week.   

## 2018-01-14 ENCOUNTER — Ambulatory Visit (INDEPENDENT_AMBULATORY_CARE_PROVIDER_SITE_OTHER): Payer: Medicaid Other | Admitting: Nurse Practitioner

## 2018-01-14 ENCOUNTER — Encounter (INDEPENDENT_AMBULATORY_CARE_PROVIDER_SITE_OTHER): Payer: Self-pay

## 2018-01-14 VITALS — BP 141/83 | HR 98 | Resp 12 | Ht 69.0 in | Wt 317.2 lb

## 2018-01-14 DIAGNOSIS — L97211 Non-pressure chronic ulcer of right calf limited to breakdown of skin: Secondary | ICD-10-CM

## 2018-01-14 NOTE — Progress Notes (Signed)
History of Present Illness  There is no documented history at this time  Assessments & Plan   There are no diagnoses linked to this encounter.    Additional instructions  Subjective:  Patient presents with venous ulcer of the Right lower extremity.    Procedure:  3 layer unna wrap was placed Right lower extremity.   Plan:   Follow up in one week.   

## 2018-01-16 NOTE — Progress Notes (Signed)
Patient's Name: [name]  MRN: [id number]  Referring Provider: Theotis Burrow*  DOB: 1965/08/14  PCP: Theotis Burrow, MD  DOS: 01/19/2018  Note by: Gaspar Cola, MD  Service setting: Ambulatory outpatient  Specialty: Interventional Pain Management  Location: [facility] (AMB) Pain Management Facility    Patient type: Established   Primary Reason(s) for Visit: Encounter for post-procedure evaluation of chronic illness with mild to moderate exacerbation CC: Back Pain (lower)  HPI  [name] is a 53 y.o. year old, male patient, who comes today for a post-procedure evaluation. He has Cellulitis; Essential hypertension, benign; Hyperlipidemia; Tobacco dependence; Obesity; Lower limb ulcer, ankle, left, with fat layer exposed ([place]); Lymphedema; Barrett's esophagus without dysplasia; Colon cancer screening; Chronic low back pain (Primary Area of Pain) (Bilateral) (R>L) w/ sciatica (Bilateral); Chronic lower extremity pain (Secondary Area of Pain) (Bilateral) (R>L); Chronic pain syndrome; Pharmacologic therapy; Disorder of skeletal system; Problems influencing health status; Adjustment disorder with mixed disturbance of emotions and conduct; Grief; DDD (degenerative disc disease), lumbar; Lumbar facet hypertrophy (Bilateral); Lumbar central spinal stenosis (L4-5); 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 His primarily concern today is the Back Pain (lower)  Pain Assessment: Location: Lower Back Radiating: both legs to the heels Onset: More than a month ago Duration: Chronic pain Quality:  Throbbing Severity: 4 /10 (subjective, self-reported pain score)  Note: Reported level is compatible with observation.                         When using our objective Pain Scale, levels between 6 and 10/10 are said to belong in an emergency room, as it progressively worsens from a 6/10, described as severely limiting, requiring emergency care not usually available at an outpatient pain management facility. At a 6/10 level, communication becomes difficult and requires great effort. Assistance to reach the emergency department may be required. Facial flushing and profuse sweating along with potentially dangerous increases in heart rate and blood pressure will be evident. Timing: Constant Modifying factors: nothing BP: 137/80  HR: 92  [name] comes in today for post-procedure evaluation.  Further details on both, my assessment(s), as well as the proposed treatment plan, please see below.  Post-Procedure Assessment  12/23/2017 Procedure: Diagnostic bilateral lumbar facet block #2 under fluoroscopic guidance and IV sedation Pre-procedure pain score:  4/10 Post-procedure pain score: 0/10 (100% relief) Influential Factors: BMI: 45.93 kg/m Intra-procedural challenges: None observed.         Assessment challenges: None detected.              Reported side-effects: None.        Post-procedural adverse reactions or complications: None reported         Sedation: Sedation provided. When no sedatives are used, the analgesic levels obtained are directly associated to the effectiveness of the local anesthetics. However, when sedation is provided, the level of analgesia obtained during the initial 1 hour following the intervention, is believed to be the result of a combination of factors. These factors may include, but are not limited to: 1. The effectiveness of the local anesthetics used. 2. The effects of the analgesic(s) and/or anxiolytic(s) used. 3.  The degree of discomfort experienced by the  patient at the time of the procedure. 4. The patients ability and reliability in recalling and recording the events. 5. The presence and influence of possible secondary gains and/or psychosocial factors. Reported result: Relief experienced during the 1st hour after the procedure: 100 % (Ultra-Short Term Relief)            Interpretative annotation: Clinically appropriate result. Analgesia during this period is likely to be Local Anesthetic and/or IV Sedative (Analgesic/Anxiolytic) related.          Effects of local anesthetic: The analgesic effects attained during this period are directly associated to the localized infiltration of local anesthetics and therefore cary significant diagnostic value as to the etiological location, or anatomical origin, of the pain. Expected duration of relief is directly dependent on the pharmacodynamics of the local anesthetic used. Long-acting (4-6 hours) anesthetics used.  Reported result: Relief during the next 4 to 6 hour after the procedure: 0 % (Short-Term Relief)            Interpretative annotation: Unexpected result. No analgesic effect would suggest pain etiology to reside elsewhere.          Long-term benefit: Defined as the period of time past the expected duration of local anesthetics (1 hour for short-acting and 4-6 hours for long-acting). With the possible exception of prolonged sympathetic blockade from the local anesthetics, benefits during this period are typically attributed to, or associated with, other factors such as analgesic sensory neuropraxia, antiinflammatory effects, or beneficial biochemical changes provided by agents other than the local anesthetics.  Reported result: Extended relief following procedure: 0 % (Long-Term Relief)            Interpretative annotation: Clinically possible results. No benefit. Therapeutic failure. No significant inflammatory component detected.          Current benefits: Defined as reported results that persistent  at this point in time.   Analgesia: 0 %            Function: Back to baseline ROM: Back to baseline Interpretative annotation: Recurrence of symptoms. No permanent benefit expected. Results would suggest persistent aggravating factors.          Interpretation: Results would suggest a successful diagnostic intervention.                  Plan:  Re-assessment of algesic etiology. The patient's diagnostic studies would suggest that he has a spinal stenosis with bilateral lateral recess stenosis at the L4-5 level.  To avoid going into the area of stenosis, I will go ahead and do a midline LESI at L3-4 and approach the bilateral lateral recess stenosis through bilateral transforaminal L4 LESI under fluoroscopic guidance and IV sedation.          Laboratory Chemistry  Inflammation Markers (CRP: Acute Phase) (ESR: Chronic Phase) Lab Results  Component Value Date   CRP 5 10/07/2017   ESRSEDRATE 23 10/07/2017                         Rheumatology Markers No results found for: RF, ANA, LABURIC, URICUR, LYMEIGGIGMAB, LYMEABIGMQN, HLAB27                      Renal Markers Lab Results  Component Value Date   BUN 12 10/15/2017   CREATININE 1.04 10/15/2017   BCR 9 10/07/2017   GFRAA >60 10/15/2017   GFRNONAA >60 10/15/2017  Hepatic Markers Lab Results  Component Value Date   AST 21 10/15/2017   ALT 23 10/15/2017   ALBUMIN 4.0 10/15/2017                        Neuropathy Markers Lab Results  Component Value Date   VITAMINB12 466 10/07/2017                        Hematology Parameters Lab Results  Component Value Date   PLT 200 10/15/2017   HGB 14.6 10/15/2017   HCT 43.1 10/15/2017                        CV Markers No results found for: BNP, CKTOTAL, CKMB, TROPONINI                       Note: Lab results reviewed.  Recent Imaging Results   Results for orders placed in visit on 12/23/17  DG C-Arm 1-60 Min-No Report   Narrative Fluoroscopy was  utilized by the requesting physician.  No radiographic  interpretation.    Interpretation Report: Fluoroscopy was used during the procedure to assist with needle guidance. The images were interpreted intraoperatively by the requesting physician.  Meds   Current Outpatient Medications:  .  albuterol (VENTOLIN HFA) 108 (90 Base) MCG/ACT inhaler, Inhale 2 puffs into the lungs every 6 (six) hours as needed for wheezing or shortness of breath., Disp: , Rfl:  .  atorvastatin (LIPITOR) 20 MG tablet, Take 20 mg by mouth at bedtime. , Disp: , Rfl:  .  buPROPion (WELLBUTRIN SR) 150 MG 12 hr tablet, Take 150 mg by mouth 2 (two) times daily., Disp: , Rfl:  .  Calcium Carbonate-Vit D-Min (GNP CALCIUM 1200) 1200-1000 MG-UNIT CHEW, Chew 1,200 mg by mouth daily with breakfast. Take in combination with vitamin D and magnesium., Disp: 30 tablet, Rfl: 5 .  Cholecalciferol (VITAMIN D3) 5000 units CAPS, Take 1 capsule (5,000 Units total) by mouth daily with breakfast. Take along with calcium and magnesium., Disp: 30 capsule, Rfl: 5 .  Fluticasone-Salmeterol (ADVAIR DISKUS) 100-50 MCG/DOSE AEPB, Inhale 1 puff into the lungs 2 (two) times daily. , Disp: , Rfl:  .  furosemide (LASIX) 40 MG tablet, Take 1 tablet (40 mg total) by mouth daily., Disp: 30 tablet, Rfl: 0 .  hydrochlorothiazide (MICROZIDE) 12.5 MG capsule, Take 12.5 mg by mouth daily. , Disp: , Rfl: 0 .  loratadine (CLARITIN) 10 MG tablet, Take 10 mg by mouth daily., Disp: , Rfl:  .  losartan (COZAAR) 50 MG tablet, Take 1 tablet (50 mg total) by mouth daily., Disp: 30 tablet, Rfl: 0 .  Magnesium 500 MG CAPS, Take 1 capsule (500 mg total) by mouth 2 (two) times daily at 8 am and 10 pm., Disp: 60 capsule, Rfl: 5 .  metFORMIN (GLUCOPHAGE) 1000 MG tablet, Take 1,000 mg by mouth 2 (two) times daily with a meal., Disp: , Rfl:  .  oxybutynin (DITROPAN) 5 MG tablet, Take 5 mg by mouth every 12 (twelve) hours., Disp: , Rfl:  .  pantoprazole (PROTONIX) 40 MG tablet,  Take 40 mg by mouth 2 (two) times daily. , Disp: , Rfl:   ROS  Constitutional: Denies any fever or chills Gastrointestinal: No reported hemesis, hematochezia, vomiting, or acute GI distress Musculoskeletal: Denies any acute onset joint swelling, redness, loss of ROM, or weakness Neurological: No reported episodes of  acute onset apraxia, aphasia, dysarthria, agnosia, amnesia, paralysis, loss of coordination, or loss of consciousness  Allergies  [name] is allergic to shellfish allergy; codeine; and lisinopril.  [place]  Drug: [name]  reports no history of drug use. Alcohol:  reports no history of alcohol use. Tobacco:  reports that he has been smoking cigarettes. He has a 30.00 pack-year smoking history. He has never used smokeless tobacco. Medical:  has a past medical history of COPD (chronic obstructive pulmonary disease) ([place]), Coronary artery disease, Diabetes mellitus without complication ([place]), Hypertension, Peptic ulcer, and Sleep apnea. Surgical: [name]  has a past surgical history that includes Esophagogastroduodenoscopy (egd) with propofol (N/A, 11/09/2016); Colonoscopy with propofol (N/A, 11/09/2016); Colonoscopy with propofol (N/A, 07/06/2017); and Esophagogastroduodenoscopy (egd) with propofol (N/A, 07/06/2017). Family: family history includes Diabetes in his father and mother; Stomach cancer in his father.  Constitutional Exam  General appearance: Well nourished, well developed, and well hydrated. In no apparent acute distress Vitals:   01/19/18 1116  BP: 137/80  Pulse: 92  Resp: 18  Temp: 98.2 F (36.8 C)  TempSrc: Oral  SpO2: 97%  Weight: (!) 311 lb (141.1 kg)  Height: 5' 9"  (1.753 m)   BMI Assessment: Estimated body mass index is 45.93 kg/m as calculated from the following:   Height as of this encounter: 5' 9"  (1.753 m).   Weight as of this encounter: 311 lb (141.1 kg).  BMI interpretation table: BMI level Category Range association with higher incidence  of chronic pain  <18 kg/m2 Underweight   18.5-24.9 kg/m2 Ideal body weight   25-29.9 kg/m2 Overweight Increased incidence by 20%  30-34.9 kg/m2 Obese (Class I) Increased incidence by 68%  35-39.9 kg/m2 Severe obesity (Class II) Increased incidence by 136%  >40 kg/m2 Extreme obesity (Class III) Increased incidence by 254%   Patient's current BMI Ideal Body weight  Body mass index is 45.93 kg/m. Ideal body weight: 70.7 kg (155 lb 13.8 oz) Adjusted ideal body weight: 98.8 kg (217 lb 14.7 oz)   BMI Readings from Last 4 Encounters:  01/19/18 45.93 kg/m  01/14/18 46.84 kg/m  01/07/18 47.29 kg/m  12/31/17 46.22 kg/m   Wt Readings from Last 4 Encounters:  01/19/18 (!) 311 lb (141.1 kg)  01/14/18 (!) 317 lb 3.2 oz (143.9 kg)  01/07/18 (!) 320 lb 3.2 oz (145.2 kg)  12/31/17 (!) 313 lb (142 kg)  Psych/Mental status: Alert, oriented x 3 (person, place, & time)       Eyes: PERLA Respiratory: No evidence of acute respiratory distress  Cervical Spine Area Exam  Skin & Axial Inspection: No masses, redness, edema, swelling, or associated skin lesions Alignment: Symmetrical Functional ROM: Unrestricted ROM      Stability: No instability detected Muscle Tone/Strength: Functionally intact. No obvious neuro-muscular anomalies detected. Sensory (Neurological): Unimpaired Palpation: No palpable anomalies              Upper Extremity (UE) Exam    Side: Right upper extremity  Side: Left upper extremity  Skin & Extremity Inspection: Skin color, temperature, and hair growth are WNL. No peripheral edema or cyanosis. No masses, redness, swelling, asymmetry, or associated skin lesions. No contractures.  Skin & Extremity Inspection: Skin color, temperature, and hair growth are WNL. No peripheral edema or cyanosis. No masses, redness, swelling, asymmetry, or associated skin lesions. No contractures.  Functional ROM: Unrestricted ROM          Functional ROM: Unrestricted ROM          Muscle  Tone/Strength: Functionally intact. No obvious neuro-muscular anomalies detected.  Muscle Tone/Strength: Functionally intact. No obvious neuro-muscular anomalies detected.  Sensory (Neurological): Unimpaired          Sensory (Neurological): Unimpaired          Palpation: No palpable anomalies              Palpation: No palpable anomalies              Provocative Test(s):  Phalen's test: deferred Tinel's test: deferred Apley's scratch test (touch opposite shoulder):  Action 1 (Across chest): deferred Action 2 (Overhead): deferred Action 3 (LB reach): deferred   Provocative Test(s):  Phalen's test: deferred Tinel's test: deferred Apley's scratch test (touch opposite shoulder):  Action 1 (Across chest): deferred Action 2 (Overhead): deferred Action 3 (LB reach): deferred    Thoracic Spine Area Exam  Skin & Axial Inspection: No masses, redness, or swelling Alignment: Symmetrical Functional ROM: Unrestricted ROM Stability: No instability detected Muscle Tone/Strength: Functionally intact. No obvious neuro-muscular anomalies detected. Sensory (Neurological): Unimpaired Muscle strength & Tone: No palpable anomalies  Lumbar Spine Area Exam  Skin & Axial Inspection: No masses, redness, or swelling Alignment: Symmetrical Functional ROM: Decreased ROM affecting both sides Stability: No instability detected Muscle Tone/Strength: Increased muscle tone over affected area Sensory (Neurological): Movement-associated pain Palpation: Complains of area being tender to palpation       Provocative Tests: Hyperextension/rotation test: (+) bilaterally for facet joint pain. Lumbar quadrant test (Kemp's test): (+) bilaterally for facet joint pain. Lateral bending test: deferred today       Patrick's Maneuver: deferred today                   FABER* test: deferred today                   S-I anterior distraction/compression test: deferred today         S-I lateral compression test: deferred today          S-I Thigh-thrust test: deferred today         S-I Gaenslen's test: deferred today         *(Flexion, ABduction and External Rotation)  Gait & Posture Assessment  Ambulation: Limited Gait: Modified gait pattern (slower gait speed, wider stride width, and longer stance duration) associated with morbid obesity Posture: Antalgic   Lower Extremity Exam    Side: Right lower extremity  Side: Left lower extremity  Stability: No instability observed          Stability: No instability observed          Skin & Extremity Inspection: Skin color, temperature, and hair growth are WNL. No peripheral edema or cyanosis. No masses, redness, swelling, asymmetry, or associated skin lesions. No contractures.  Skin & Extremity Inspection: Skin color, temperature, and hair growth are WNL. No peripheral edema or cyanosis. No masses, redness, swelling, asymmetry, or associated skin lesions. No contractures.  Functional ROM: Unrestricted ROM                  Functional ROM: Unrestricted ROM                  Muscle Tone/Strength: Functionally intact. No obvious neuro-muscular anomalies detected.  Muscle Tone/Strength: Functionally intact. No obvious neuro-muscular anomalies detected.  Sensory (Neurological): Unimpaired        Sensory (Neurological): Unimpaired        DTR: Patellar: deferred today Achilles: deferred today Plantar: deferred today  DTR: Patellar:  deferred today Achilles: deferred today Plantar: deferred today  Palpation: No palpable anomalies  Palpation: No palpable anomalies   Assessment  Primary Diagnosis & Pertinent Problem List: The primary encounter diagnosis was Chronic low back pain (Primary Area of Pain) (Bilateral) (R>L) w/ sciatica (Bilateral). 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  Status Diagnosis  Persistent Persistent Stable 1. Chronic low back pain (Primary Area of Pain) (Bilateral) (R>L) w/ sciatica (Bilateral)   2. Chronic lower extremity pain (Secondary Area of Pain) (Bilateral) (R>L)   3. DDD (degenerative disc disease), lumbar   4. Lumbar central spinal stenosis (L4-5)   5. 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 Lumbar facet hypertrophy (Bilateral)   7. Lumbar facet syndrome (Bilateral) (R>L)     Problems updated and reviewed during this visit: No problems updated. Plan of Care  Pharmacotherapy (Medications Ordered): No orders of the defined types were placed in this encounter.  Medications administered today: [name] had no medications administered during this visit.   Procedure Orders     Lumbar Epidural Injection     Lumbar Transforaminal Epidural Lab Orders  No laboratory test(s) ordered today   Imaging Orders  No imaging studies ordered today   Referral Orders  No referral(s) requested today   Interventional management options: Planned, scheduled, and/or pending:   NOTE: NO RFA until BMI is <35  Diagnostic bilateral L4 transforaminal LESI #1 + (Midline) L3-4 LESI #1 under fluoroscopic guidance and IV sedation   Considering:    Diagnostic bilateral L4 transforaminal LESI #1  Diagnostic (Midline) L3-4 LESI #1  Diagnostic bilateral lumbar facet nerve block #3 Possible bilateral lumbar facetRFA   Palliative PRN treatment(s):   Diagnostic bilateral lumbar facet block #3 under fluoroscopic guidance and IV sedation   Provider-requested follow-up: Return for Procedure (w/ sedation): (B) L4 TFESI #1 + (ML) L3-4 LESI #1.  Future Appointments  Date Time Provider [place]  01/21/2018  9:15 AM [facility]URSE [facility] None  01/28/2018  9:15 AM [facility]URSE [facility] None  02/04/2018  9:15 AM  [facility]URSE [facility] None  02/11/2018  9:15 AM [facility]URSE [facility] None  02/18/2018  9:15 AM Kris Hartmann, NP [facility] None   Primary Care Physician: Theotis Burrow, MD Location: [facility] Note by: Gaspar Cola, MD Date: 01/19/2018; Time: 12:15 PM

## 2018-01-19 ENCOUNTER — Ambulatory Visit: Payer: Medicaid Other | Attending: Pain Medicine | Admitting: Pain Medicine

## 2018-01-19 ENCOUNTER — Encounter: Payer: Self-pay | Admitting: Pain Medicine

## 2018-01-19 ENCOUNTER — Other Ambulatory Visit: Payer: Self-pay

## 2018-01-19 VITALS — BP 137/80 | HR 92 | Temp 98.2°F | Resp 18 | Ht 69.0 in | Wt 311.0 lb

## 2018-01-19 DIAGNOSIS — M79604 Pain in right leg: Secondary | ICD-10-CM | POA: Diagnosis not present

## 2018-01-19 DIAGNOSIS — M5136 Other intervertebral disc degeneration, lumbar region: Secondary | ICD-10-CM | POA: Diagnosis present

## 2018-01-19 DIAGNOSIS — M48061 Spinal stenosis, lumbar region without neurogenic claudication: Secondary | ICD-10-CM | POA: Diagnosis present

## 2018-01-19 DIAGNOSIS — M5441 Lumbago with sciatica, right side: Secondary | ICD-10-CM | POA: Insufficient documentation

## 2018-01-19 DIAGNOSIS — G8929 Other chronic pain: Secondary | ICD-10-CM | POA: Diagnosis present

## 2018-01-19 DIAGNOSIS — M79605 Pain in left leg: Secondary | ICD-10-CM | POA: Insufficient documentation

## 2018-01-19 DIAGNOSIS — M5442 Lumbago with sciatica, left side: Secondary | ICD-10-CM | POA: Diagnosis not present

## 2018-01-19 DIAGNOSIS — M47816 Spondylosis without myelopathy or radiculopathy, lumbar region: Secondary | ICD-10-CM

## 2018-01-19 NOTE — Patient Instructions (Addendum)
___You have been given pre procedure instructions with sedation. _________________________________________________________________________________________  Preparing for Procedure with Sedation  Instructions: . Oral Intake: Do not eat or drink anything for at least 8 hours prior to your procedure. . Transportation: Public transportation is not allowed. Bring an adult driver. The driver must be physically present in our waiting room before any procedure can be started. Marland Kitchen Physical Assistance: Bring an adult physically capable of assisting you, in the event you need help. This adult should keep you company at home for at least 6 hours after the procedure. . Blood Pressure Medicine: Take your blood pressure medicine with a sip of water the morning of the procedure. . Blood thinners: Notify our staff if you are taking any blood thinners. Depending on which one you take, there will be specific instructions on how and when to stop it. . Diabetics on insulin: Notify the staff so that you can be scheduled 1st case in the morning. If your diabetes requires high dose insulin, take only  of your normal insulin dose the morning of the procedure and notify the staff that you have done so. . Preventing infections: Shower with an antibacterial soap the morning of your procedure. . Build-up your immune system: Take 1000 mg of Vitamin C with every meal (3 times a day) the day prior to your procedure. Marland Kitchen Antibiotics: Inform the staff if you have a condition or reason that requires you to take antibiotics before dental procedures. . Pregnancy: If you are pregnant, call and cancel the procedure. . Sickness: If you have a cold, fever, or any active infections, call and cancel the procedure. . Arrival: You must be in the facility at least 30 minutes prior to your scheduled procedure. . Children: Do not bring children with you. . Dress appropriately: Bring dark clothing that you would not mind if they get  stained. . Valuables: Do not bring any jewelry or valuables.  Procedure appointments are reserved for interventional treatments only. Marland Kitchen No Prescription Refills. . No medication changes will be discussed during procedure appointments. . No disability issues will be discussed.  Reasons to call and reschedule or cancel your procedure: (Following these recommendations will minimize the risk of a serious complication.) . Surgeries: Avoid having procedures within 2 weeks of any surgery. (Avoid for 2 weeks before or after any surgery). . Flu Shots: Avoid having procedures within 2 weeks of a flu shots or . (Avoid for 2 weeks before or after immunizations). . Barium: Avoid having a procedure within 7-10 days after having had a radiological study involving the use of radiological contrast. (Myelograms, Barium swallow or enema study). . Heart attacks: Avoid any elective procedures or surgeries for the initial 6 months after a "Myocardial Infarction" (Heart Attack). . Blood thinners: It is imperative that you stop these medications before procedures. Let us know if you if you take any blood thinner.  . Infection: Avoid procedures during or within two weeks of an infection (including chest colds or gastrointestinal problems). Symptoms associated with infections include: Localized redness, fever, chills, night sweats or profuse sweating, burning sensation when voiding, cough, congestion, stuffiness, runny nose, sore throat, diarrhea, nausea, vomiting, cold or Flu symptoms, recent or current infections. It is specially important if the infection is over the area that we intend to treat. Marland Kitchen Heart and lung problems: Symptoms that may suggest an active cardiopulmonary problem include: cough, chest pain, breathing difficulties or shortness of breath, dizziness, ankle swelling, uncontrolled high or unusually low blood pressure, and/or palpitations.  If you are experiencing any of these symptoms, cancel your procedure and  contact your primary care physician for an evaluation.  Remember:  Regular Business hours are:  Monday to Thursday 8:00 AM to 4:00 PM  Provider's Schedule: Milinda Pointer, MD:  Procedure days: Tuesday and Thursday 7:30 AM to 4:00 PM  Gillis Santa, MD:  Procedure days: Monday and Wednesday 7:30 AM to 4:00 PM ____________________________________________________________________________________________    Epidural Steroid Injection An epidural steroid injection is given to relieve pain in your neck, back, or legs that is caused by the irritation or swelling of a nerve root. This procedure involves injecting a steroid and numbing medicine (anesthetic) into the epidural space. The epidural space is the space between the outer covering of your spinal cord and the bones that form your backbone (vertebra).  LET [facility] CARE PROVIDER KNOW ABOUT:   Any allergies you have.  All medicines you are taking, including vitamins, herbs, eye drops, creams, and over-the-counter medicines such as aspirin.  Previous problems you or members of your family have had with the use of anesthetics.  Any blood disorders or blood clotting disorders you have.  Previous surgeries you have had.  Medical conditions you have.  RISKS AND COMPLICATIONS Generally, this is a safe procedure. However, as with any procedure, complications can occur. Possible complications of epidural steroid injection include:  Headache.  Bleeding.  Infection.  Allergic reaction to the medicines.  Damage to your nerves. The response to this procedure depends on the underlying cause of the pain and its duration. People who have long-term (chronic) pain are less likely to benefit from epidural steroids than are those people whose pain comes on strong and suddenly.  BEFORE THE PROCEDURE   Ask your health care provider about changing or stopping your regular medicines. You may be advised to stop taking blood-thinning  medicines a few days before the procedure.  You may be given medicines to reduce anxiety.  Arrange for someone to take you home after the procedure.  PROCEDURE   You will remain awake during the procedure. You may receive medicine to make you relaxed.  You will be asked to lie on your stomach.  The injection site will be cleaned.  The injection site will be numbed with a medicine (local anesthetic).  A needle will be injected through your skin into the epidural space.  Your health care provider will use an X-ray machine to ensure that the steroid is delivered closest to the affected nerve. You may have minimal discomfort at this time.  Once the needle is in the right position, the local anesthetic and the steroid will be injected into the epidural space.  The needle will then be removed and a bandage will be applied to the injection site.  AFTER THE PROCEDURE   You may be monitored for a short time before you go home.  You may feel weakness or numbness in your arm or leg, which disappears within hours.  You may be allowed to eat, drink, and take your regular medicine.  You may have soreness at the site of the injection.   This information is not intended to replace advice given to you by your health care provider. Make sure you discuss any questions you have with your health care provider.   Document Released: 04/07/2007 Document Revised: 08/31/2012 Document Reviewed: 06/17/2012 Elsevier Interactive Patient Education 2016 [place]  What are the risk, side effects and possible complications? Generally speaking, most  procedures are safe.  However, with any procedure there are risks, side effects, and the possibility of complications.  The risks and complications are dependent upon the sites that are lesioned, or the type of nerve block to be performed.  The closer the procedure is to the spine, the more serious the risks are.  Great care  is taken when placing the radio frequency needles, block needles or lesioning probes, but sometimes complications can occur. Infection: Any time there is an injection through the skin, there is a risk of infection.  This is why sterile conditions are used for these blocks. There are four possible types of infection: 1. Localized skin infection. 2. Central Nervous System Infection: This can be in the form of Meningitis, which can be deadly. 3. Epidural Infections: This can be in the form of an epidural abscess, which can cause pressure inside of the spine, causing compression of the spinal cord with subsequent paralysis. This would require an emergency surgery to decompress, and there are no guarantees that the patient would recover from the paralysis. 4. Discitis: This is an infection of the intervertebral discs. It occurs in about 1% of discography procedures. It is difficult to treat and it may lead to surgery. Pain: the needles have to go through skin and soft tissues, will cause soreness. Damage to internal structures:  The nerves to be lesioned may be near blood vessels or other nerves which can be potentially damaged. Bleeding: Bleeding is more common if the patient is taking blood thinners such as  aspirin, Coumadin, Ticiid, Plavix, etc., or if he/she have some genetic predisposition such as hemophilia. Bleeding into the spinal canal can cause compression of the spinal  cord with subsequent paralysis.  This would require an emergency surgery to decompress and there are no guarantees that the patient would recover from the paralysis. Pneumothorax: Puncturing of a lung is a possibility, every time a needle is introduced in the area of the chest or upper back.  Pneumothorax refers to free air around the collapsed lung(s), inside of the thoracic cavity (chest cavity).  Another two possible complications related to a similar event would include: Hemothorax and Chylothorax. These are variations of the  Pneumothorax, where instead of air around the collapsed lung(s), you may have blood or chyle, respectively. Spinal headaches: They may occur with any procedures in the area of the spine. Persistent CSF (Cerebro-Spinal Fluid) leakage: This is a rare problem, but may occur with prolonged intrathecal or epidural catheters either due to the formation of a fistulous track or a dural tear. Nerve damage: By working so close to the spinal cord, there is always a possibility of nerve damage, which could be as serious as a permanent spinal cord injury with paralysis. Death: Although rare, severe deadly allergic reactions known as "Anaphylactic reaction" can occur to any of the medications used. Worsening of the symptoms: We can always make thing worse.  What are the chances of something like this happening? Chances of any of this occuring are extremely low.  By statistics, you have more of a chance of getting killed in a motor vehicle accident: while driving to the hospital than any of the above occurring .  Nevertheless, you should be aware that they are possibilities.  In general, it is similar to taking a shower.  Everybody knows that you can slip, hit your head and get killed.  Does that mean that you should not shower again?  Nevertheless always keep in mind that statistics do  not mean anything if you happen to be on the wrong side of them.  Even if a procedure has a 1 (one) in a 1,000,000 (million) chance of going wrong, it you happen to be that one..Also, keep in mind that by statistics, you have more of a chance of having something go wrong when taking medications.  Who should not have this procedure? If you are on a blood thinning medication (e.g. Coumadin, Plavix, see list of "Blood Thinners"), or if you have an active infection going on, you should not have the procedure.  If you are taking any blood thinners, please inform your physician.  Preparing for your procedure: 1. Do not eat or drink anything  at least eight (8) hours prior to the procedure. 2. Bring a driver with you .  It cannot be a taxi. 3. Come accompanied by an adult that can drive you back, and that is strong enough to help you if your legs get weak or numb from the local anesthetic. 4. Take all of your medicines the morning of the procedure with just enough water to swallow them. 5. If you have diabetes, make sure that you are scheduled to have your procedure done first thing in the morning, whenever possible. 6. If you have diabetes, take only half of your insulin dose and notify our nurse that you have done so as soon as you arrive at the clinic. 7. If you are diabetic, but only take blood sugar pills (oral hypoglycemic), then do not take them on the morning of your procedure.  You may take them after you have had the procedure. 8. Do not take aspirin or any aspirin-containing medications, at least eleven (11) days prior to the procedure.  They may prolong bleeding. 9. Wear loose fitting clothing that may be easy to take off and that you would not mind if it got stained with Betadine or blood. 10. Do not wear any jewelry or perfume 11. Remove any nail coloring.  It will interfere with some of our monitoring equipment. 12. If you take Metformin for your diabetes, stop it 48 hours prior to the procedure.  NOTE: Remember that this is not meant to be interpreted as a complete list of all possible complications.  Unforeseen problems may occur.  BLOOD THINNERS The following drugs contain aspirin or other products, which can cause increased bleeding during surgery and should not be taken for 2 weeks prior to and 1 week after surgery.  If you should need take something for relief of minor pain, you may take acetaminophen which is found in Tylenol,m Datril, Anacin-3 and Panadol. It is not blood thinner. The products listed below are.  Do not take any of the products listed below in addition to any listed on your instruction  sheet.  A.P.C or A.P.C with Codeine Codeine Phosphate Capsules #3 Ibuprofen Ridaura  ABC compound Congesprin Imuran rimadil  Advil Cope Indocin Robaxisal  Alka-Seltzer Effervescent Pain Reliever and Antacid Coricidin or Coricidin-D  Indomethacin Rufen  Alka-Seltzer plus Cold Medicine Cosprin Ketoprofen S-A-C Tablets  Anacin Analgesic Tablets or Capsules Coumadin Korlgesic Salflex  Anacin Extra Strength Analgesic tablets or capsules CP-2 Tablets Lanoril Salicylate  Anaprox Cuprimine Capsules Levenox Salocol  Anexsia-D Dalteparin Magan Salsalate  Anodynos Darvon compound Magnesium Salicylate Sine-off  Ansaid Dasin Capsules Magsal Sodium Salicylate  Anturane Depen Capsules Marnal Soma  APF Arthritis pain formula Dewitt's Pills Measurin Stanback  Argesic Dia-Gesic Meclofenamic Sulfinpyrazone  Arthritis Bayer Timed Release Aspirin Diclofenac Meclomen Sulindac  Arthritis  pain formula Anacin Dicumarol Medipren Supac  Analgesic (Safety coated) Arthralgen Diffunasal Mefanamic Suprofen  Arthritis Strength Bufferin Dihydrocodeine Mepro Compound Suprol  Arthropan liquid Dopirydamole Methcarbomol with Aspirin Synalgos  ASA tablets/Enseals Disalcid Micrainin Tagament  Ascriptin Doan's Midol Talwin  Ascriptin A/D Dolene Mobidin Tanderil  Ascriptin Extra Strength Dolobid Moblgesic Ticlid  Ascriptin with Codeine Doloprin or Doloprin with Codeine Momentum Tolectin  Asperbuf Duoprin Mono-gesic Trendar  Aspergum Duradyne Motrin or Motrin IB Triminicin  Aspirin plain, buffered or enteric coated Durasal Myochrisine Trigesic  Aspirin Suppositories Easprin Nalfon Trillsate  Aspirin with Codeine Ecotrin Regular or Extra Strength Naprosyn Uracel  Atromid-S Efficin Naproxen Ursinus  Auranofin Capsules Elmiron Neocylate Vanquish  Axotal Emagrin Norgesic Verin  Azathioprine Empirin or Empirin with Codeine Normiflo Vitamin E  Azolid Emprazil Nuprin Voltaren  Bayer Aspirin plain, buffered or children's or  timed BC Tablets or powders Encaprin Orgaran Warfarin Sodium  Buff-a-Comp Enoxaparin Orudis Zorpin  Buff-a-Comp with Codeine Equegesic Os-Cal-Gesic   Buffaprin Excedrin plain, buffered or Extra Strength Oxalid   Bufferin Arthritis Strength Feldene Oxphenbutazone   Bufferin plain or Extra Strength Feldene Capsules Oxycodone with Aspirin   Bufferin with Codeine Fenoprofen Fenoprofen Pabalate or Pabalate-SF   Buffets II Flogesic Panagesic   Buffinol plain or Extra Strength Florinal or Florinal with Codeine Panwarfarin   Buf-Tabs Flurbiprofen Penicillamine   Butalbital Compound Four-way cold tablets Penicillin   Butazolidin Fragmin Pepto-Bismol   Carbenicillin Geminisyn Percodan   Carna Arthritis Reliever Geopen Persantine   Carprofen Gold's salt Persistin   Chloramphenicol Goody's Phenylbutazone   Chloromycetin Haltrain Piroxlcam   Clmetidine heparin Plaquenil   Cllnoril Hyco-pap Ponstel   Clofibrate Hydroxy chloroquine Propoxyphen         Before stopping any of these medications, be sure to consult the physician who ordered them.  Some, such as Coumadin (Warfarin) are ordered to prevent or treat serious conditions such as "deep thrombosis", "pumonary embolisms", and other heart problems.  The amount of time that you may need off of the medication may also vary with the medication and the reason for which you were taking it.  If you are taking any of these medications, please make sure you notify your pain physician before you undergo any procedures.

## 2018-01-19 NOTE — Progress Notes (Signed)
Safety precautions to be maintained throughout the outpatient stay will include: orient to surroundings, keep bed in low position, maintain call bell within reach at all times, provide assistance with transfer out of bed and ambulation.  

## 2018-01-21 ENCOUNTER — Encounter (INDEPENDENT_AMBULATORY_CARE_PROVIDER_SITE_OTHER): Payer: Self-pay

## 2018-01-21 ENCOUNTER — Ambulatory Visit (INDEPENDENT_AMBULATORY_CARE_PROVIDER_SITE_OTHER): Payer: Medicaid Other | Admitting: Nurse Practitioner

## 2018-01-21 VITALS — BP 148/84 | HR 97 | Resp 18 | Wt 318.0 lb

## 2018-01-21 DIAGNOSIS — L97211 Non-pressure chronic ulcer of right calf limited to breakdown of skin: Secondary | ICD-10-CM

## 2018-01-21 NOTE — Progress Notes (Signed)
History of Present Illness  There is no documented history at this time  Assessments & Plan   There are no diagnoses linked to this encounter.    Additional instructions  Subjective:  Patient presents with venous ulcer of the Right lower extremity.    Procedure:  3 layer unna wrap was placed Right lower extremity.   Plan:   Follow up in one week.   

## 2018-01-27 ENCOUNTER — Other Ambulatory Visit: Payer: Self-pay

## 2018-01-27 ENCOUNTER — Encounter: Payer: Self-pay | Admitting: Pain Medicine

## 2018-01-27 ENCOUNTER — Ambulatory Visit
Admission: RE | Admit: 2018-01-27 | Discharge: 2018-01-27 | Disposition: A | Discharge status: Home / Self Care | Payer: Medicaid Other | Source: Ambulatory Visit | Attending: Pain Medicine | Admitting: Pain Medicine

## 2018-01-27 ENCOUNTER — Ambulatory Visit (HOSPITAL_BASED_OUTPATIENT_CLINIC_OR_DEPARTMENT_OTHER): Payer: Medicaid Other | Admitting: Pain Medicine

## 2018-01-27 VITALS — BP 126/75 | HR 111 | Temp 98.0°F | Resp 17 | Ht 69.0 in | Wt 311.0 lb

## 2018-01-27 DIAGNOSIS — Z91013 Allergy to seafood: Secondary | ICD-10-CM

## 2018-01-27 DIAGNOSIS — M5136 Other intervertebral disc degeneration, lumbar region: Secondary | ICD-10-CM

## 2018-01-27 DIAGNOSIS — M47816 Spondylosis without myelopathy or radiculopathy, lumbar region: Secondary | ICD-10-CM | POA: Insufficient documentation

## 2018-01-27 DIAGNOSIS — M5441 Lumbago with sciatica, right side: Secondary | ICD-10-CM | POA: Insufficient documentation

## 2018-01-27 DIAGNOSIS — M5442 Lumbago with sciatica, left side: Secondary | ICD-10-CM

## 2018-01-27 DIAGNOSIS — M79605 Pain in left leg: Secondary | ICD-10-CM

## 2018-01-27 DIAGNOSIS — M79604 Pain in right leg: Secondary | ICD-10-CM | POA: Diagnosis present

## 2018-01-27 DIAGNOSIS — G8929 Other chronic pain: Secondary | ICD-10-CM

## 2018-01-27 DIAGNOSIS — M48061 Spinal stenosis, lumbar region without neurogenic claudication: Secondary | ICD-10-CM | POA: Diagnosis present

## 2018-01-27 MED ORDER — MIDAZOLAM HCL 5 MG/5ML IJ SOLN
1.0000 mg | INTRAMUSCULAR | Status: DC | PRN
  Administered 2018-01-27: 2 mg via INTRAVENOUS
  Filled 2018-01-27: qty 5

## 2018-01-27 MED ORDER — FENTANYL CITRATE (PF) 100 MCG/2ML IJ SOLN
25.0000 ug | INTRAMUSCULAR | Status: DC | PRN
  Administered 2018-01-27: 50 ug via INTRAVENOUS
  Filled 2018-01-27: qty 2

## 2018-01-27 MED ORDER — LACTATED RINGERS IV SOLN
1000.0000 mL | Freq: Once | INTRAVENOUS | Status: AC
  Administered 2018-01-27: 1000 mL via INTRAVENOUS

## 2018-01-27 MED ORDER — DEXAMETHASONE SODIUM PHOSPHATE 10 MG/ML IJ SOLN
10.0000 mg | Freq: Once | INTRAMUSCULAR | Status: AC
  Administered 2018-01-27: 20 mg
  Filled 2018-01-27: qty 1

## 2018-01-27 MED ORDER — SODIUM CHLORIDE 0.9% FLUSH
1.0000 mL | Freq: Once | INTRAVENOUS | Status: AC
  Administered 2018-01-27: 1 mL

## 2018-01-27 MED ORDER — SODIUM CHLORIDE 0.9% FLUSH
2.0000 mL | Freq: Once | INTRAVENOUS | Status: AC
  Administered 2018-01-27: 2 mL

## 2018-01-27 MED ORDER — ROPIVACAINE HCL 2 MG/ML IJ SOLN
1.0000 mL | Freq: Once | INTRAMUSCULAR | Status: AC
  Administered 2018-01-27: 1 mL via EPIDURAL
  Filled 2018-01-27: qty 10

## 2018-01-27 MED ORDER — DEXAMETHASONE SODIUM PHOSPHATE 10 MG/ML IJ SOLN
10.0000 mg | Freq: Once | INTRAMUSCULAR | Status: AC
  Administered 2018-01-27: 10 mg
  Filled 2018-01-27: qty 1

## 2018-01-27 MED ORDER — TRIAMCINOLONE ACETONIDE 40 MG/ML IJ SUSP
40.0000 mg | Freq: Once | INTRAMUSCULAR | Status: AC
  Administered 2018-01-27: 40 mg
  Filled 2018-01-27: qty 1

## 2018-01-27 MED ORDER — ROPIVACAINE HCL 2 MG/ML IJ SOLN
2.0000 mL | Freq: Once | INTRAMUSCULAR | Status: AC
  Administered 2018-01-27: 2 mL via EPIDURAL
  Filled 2018-01-27: qty 10

## 2018-01-27 MED ORDER — LIDOCAINE HCL 2 % IJ SOLN
20.0000 mL | Freq: Once | INTRAMUSCULAR | Status: AC
  Administered 2018-01-27: 400 mg
  Filled 2018-01-27: qty 40

## 2018-01-27 NOTE — Progress Notes (Signed)
Patient's Name: [name]  MRN: [id number]  Referring Provider: Theotis Burrow*  DOB: [birth year]  PCP: Theotis Burrow, MD  DOS: 01/27/2018  Note by: Gaspar Cola, MD  Service setting: Ambulatory outpatient  Specialty: Interventional Pain Management  Patient type: Established  Location: [facility] (AMB) Pain Management Facility  Visit type: Interventional Procedure   Primary Reason for Visit: Interventional Pain Management Treatment. CC: Back Pain (lower)  Procedure #1:  Anesthesia, Analgesia, Anxiolysis:  Type: Diagnostic Trans-Foraminal Epidural Steroid Injection   #1  Region: Lumbar Level: L4 Paravertebral Laterality: Bilateral Paravertebral   Type: Moderate (Conscious) Sedation combined with Local Anesthesia Indication(s): Analgesia and Anxiety Route: Intravenous (IV) IV Access: Secured Sedation: Meaningful verbal contact was maintained at all times during the procedure  Local Anesthetic: Lidocaine 1-2%  Position: Prone  Procedure #2:    Type: Diagnostic Inter-Laminar Epidural Steroid Injection   #1  Region: Lumbar Level: L3-4 Level. Laterality: Midline             Indications: 1. DDD (degenerative disc disease), lumbar   2. 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 Lumbar central spinal stenosis (L4-5)   4. Lumbar spondylosis   5. 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 Chronic lower extremity pain (Secondary Area of Pain) (Bilateral) (R>L)   7. History of allergy to shellfish    Pain Score: Pre-procedure: 4 /10 Post-procedure: 0-No pain/10  Pre-op Assessment:  [name] is a 53 y.o. (year old), male patient, seen today for interventional treatment. He  has a past surgical history that includes Esophagogastroduodenoscopy (egd) with propofol (N/A, 11/09/2016); Colonoscopy with propofol (N/A, 11/09/2016); Colonoscopy with propofol (N/A, 07/06/2017); and Esophagogastroduodenoscopy  (egd) with propofol (N/A, 07/06/2017). [name] has a current medication list which includes the following prescription(s): albuterol, atorvastatin, bupropion, gnp calcium 1200, vitamin d3, fluticasone-salmeterol, furosemide, hydrochlorothiazide, loratadine, losartan, magnesium, metformin, oxybutynin, and pantoprazole, and the following Facility-Administered Medications: fentanyl, lactated ringers, and midazolam. His primarily concern today is the Back Pain (lower)  Initial Vital Signs:  Pulse/HCG Rate: (!) 103ECG Heart Rate: (!) 102 Temp: 98.9 F (37.2 C) Resp: 16 BP: 128/73 SpO2: 98 %  BMI: Estimated body mass index is 45.93 kg/m as calculated from the following:   Height as of this encounter: 5\' 9"  (1.753 m).   Weight as of this encounter: 311 lb (141.1 kg).  Risk Assessment: Allergies: Reviewed. He is allergic to shellfish allergy; codeine; and lisinopril.  Allergy Precautions: No iodine containing solutions or radiological contrast used. Coagulopathies: Reviewed. None identified.  Blood-thinner therapy: None at this time Active Infection(s): Reviewed. None identified. [name] is afebrile  Site Confirmation: [name] was asked to confirm the procedure and laterality before marking the site Procedure checklist: Completed Consent: Before the procedure and under the influence of no sedative(s), amnesic(s), or anxiolytics, the patient was informed of the treatment options, risks and possible complications. To fulfill our ethical and legal obligations, as recommended by the American Medical Association's Code of Ethics, I have informed the patient of my clinical impression; the nature and purpose of the treatment or procedure; the risks, benefits, and possible complications of the intervention; the alternatives, including doing nothing; the risk(s) and benefit(s) of the alternative treatment(s) or procedure(s); and the risk(s) and benefit(s) of doing nothing. The patient was provided  information about the general risks and possible complications associated with the procedure. These may include, but are not limited to: failure to achieve  desired goals, infection, bleeding, organ or nerve damage, allergic reactions, paralysis, and death. In addition, the patient was informed of those risks and complications associated to Spine-related procedures, such as failure to decrease pain; infection (i.e.: Meningitis, epidural or intraspinal abscess); bleeding (i.e.: epidural hematoma, subarachnoid hemorrhage, or any other type of intraspinal or peri-dural bleeding); organ or nerve damage (i.e.: Any type of peripheral nerve, nerve root, or spinal cord injury) with subsequent damage to sensory, motor, and/or autonomic systems, resulting in permanent pain, numbness, and/or weakness of one or several areas of the body; allergic reactions; (i.e.: anaphylactic reaction); and/or death. Furthermore, the patient was informed of those risks and complications associated with the medications. These include, but are not limited to: allergic reactions (i.e.: anaphylactic or anaphylactoid reaction(s)); adrenal axis suppression; blood sugar elevation that in diabetics may result in ketoacidosis or comma; water retention that in patients with history of congestive heart failure may result in shortness of breath, pulmonary edema, and decompensation with resultant heart failure; weight gain; swelling or edema; medication-induced neural toxicity; particulate matter embolism and blood vessel occlusion with resultant organ, and/or nervous system infarction; and/or aseptic necrosis of one or more joints. Finally, the patient was informed that Medicine is not an exact science; therefore, there is also the possibility of unforeseen or unpredictable risks and/or possible complications that may result in a catastrophic outcome. The patient indicated having understood very clearly. We have given the patient no guarantees and we  have made no promises. Enough time was given to the patient to ask questions, all of which were answered to the patient's satisfaction. [name] has indicated that he wanted to continue with the procedure. Attestation: I, the ordering provider, attest that I have discussed with the patient the benefits, risks, side-effects, alternatives, likelihood of achieving goals, and potential problems during recovery for the procedure that I have provided informed consent. Date  Time: 01/27/2018 12:56 PM  Pre-Procedure Preparation:  Monitoring: As per clinic protocol. Respiration, ETCO2, SpO2, BP, heart rate and rhythm monitor placed and checked for adequate function Safety Precautions: Patient was assessed for positional comfort and pressure points before starting the procedure. Time-out: I initiated and conducted the "Time-out" before starting the procedure, as per protocol. The patient was asked to participate by confirming the accuracy of the "Time Out" information. Verification of the correct person, site, and procedure were performed and confirmed by me, the nursing staff, and the patient. "Time-out" conducted as per Joint Commission's Universal Protocol (UP.01.01.01). Time: 1348  Description of Procedure #1:  Target Area: The inferior and lateral portion of the pedicle, just lateral to a line created by the 6:00 position of the pedicle and the superior articular process of the vertebral body below. On the lateral view, this target lies just posterior to the anterior aspect of the lamina and posterior to the midpoint created between the anterior and the posterior aspect of the neural foramina. Approach: Posterior paravertebral approach. Area Prepped: Entire Posterior Lumbosacral Region Prepping solution: ChloraPrep (2% chlorhexidine gluconate and 70% isopropyl alcohol) Safety Precautions: Aspiration looking for blood return was conducted prior to all injections. At no point did we inject any substances,  as a needle was being advanced. No attempts were made at seeking any paresthesias. Safe injection practices and needle disposal techniques used. Medications properly checked for expiration dates. SDV (single dose vial) medications used.  Description of the Procedure: Protocol guidelines were followed. The patient was placed in position over the fluoroscopy table. The target area was identified and  the area prepped in the usual manner. Skin & deeper tissues infiltrated with local anesthetic. Appropriate amount of time allowed to pass for local anesthetics to take effect. The procedure needles were then advanced to the target area. Proper needle placement secured. Negative aspiration confirmed. Solution injected in intermittent fashion, asking for systemic symptoms every 0.2cc of injectate. The needles were then removed and the area cleansed, making sure to leave some of the prepping solution back to take advantage of its long term bactericidal properties.  Start Time: 1348 hrs.  Materials:  Needle(s) Type: Spinal Needle Gauge: 22G Length: 3.5-in Medication(s): Please see orders for medications and dosing details.  Description of Procedure #2:  Target Area: The  interlaminar space, initially targeting the lower border of the superior vertebral body lamina. Approach: Posterior paramedial approach. Area Prepped: Same as above Prepping solution: Same as above Safety Precautions: Same as above  Description of the Procedure: Protocol guidelines were followed. The patient was placed in position over the fluoroscopy table. The target area was identified and the area prepped in the usual manner. Skin & deeper tissues infiltrated with local anesthetic. Appropriate amount of time allowed to pass for local anesthetics to take effect. The procedure needle was introduced through the skin, ipsilateral to the reported pain, and advanced to the target area. Bone was contacted and the needle walked caudad, until the  lamina was cleared. The ligamentum flavum was engaged and loss-of-resistance technique used as the epidural needle was advanced. The epidural space was identified using "loss-of-resistance technique" with 2-3 ml of PF-NaCl (0.9% NSS), in a 5cc LOR glass syringe. Proper needle placement secured. Negative aspiration confirmed. Solution injected in intermittent fashion, asking for systemic symptoms every 0.5cc of injectate. The needles were then removed and the area cleansed, making sure to leave some of the prepping solution back to take advantage of its long term bactericidal properties.  Vitals:   01/27/18 1410 01/27/18 1418 01/27/18 1428 01/27/18 1438  BP: 114/80 (!) 156/87 117/80 126/75  Pulse: (!) 111     Resp: (!) 21 20 18 17   Temp:  98 F (36.7 C)  98 F (36.7 C)  TempSrc:  Temporal    SpO2: 97% 95% 98% 97%  Weight:      Height:        End Time: 1408 hrs.  Materials:  Needle(s) Type: Epidural needle Gauge: 17G Length: 3.5-in Medication(s): Please see orders for medications and dosing details.  Imaging Guidance (Spinal):          Type of Imaging Technique: Fluoroscopy Guidance (Spinal) Indication(s): Assistance in needle guidance and placement for procedures requiring needle placement in or near specific anatomical locations not easily accessible without such assistance. Exposure Time: Please see nurses notes. Contrast: None used. Fluoroscopic Guidance: I was personally present during the use of fluoroscopy. "Tunnel Vision Technique" used to obtain the best possible view of the target area. Parallax error corrected before commencing the procedure. "Direction-depth-direction" technique used to introduce the needle under continuous pulsed fluoroscopy. Once target was reached, antero-posterior, oblique, and lateral fluoroscopic projection used confirm needle placement in all planes. Images permanently stored in EMR. Interpretation: No contrast injected. I personally interpreted the  imaging intraoperatively. Adequate needle placement confirmed in multiple planes. Permanent images saved into the patient's record.  Antibiotic Prophylaxis:   Anti-infectives (From admission, onward)   None     Indication(s): None identified  Post-operative Assessment:  Post-procedure Vital Signs:  Pulse/HCG Rate: (!) 111(!) 104 Temp: 98 F (36.7 C) Resp:  17 BP: 126/75 SpO2: 97 %  EBL: None  Complications: No immediate post-treatment complications observed by team, or reported by patient.  Note: The patient tolerated the entire procedure well. A repeat set of vitals were taken after the procedure and the patient was kept under observation following institutional policy, for this type of procedure. Post-procedural neurological assessment was performed, showing return to baseline, prior to discharge. The patient was provided with post-procedure discharge instructions, including a section on how to identify potential problems. Should any problems arise concerning this procedure, the patient was given instructions to immediately contact us, at any time, without hesitation. In any case, we plan to contact the patient by telephone for a follow-up status report regarding this interventional procedure.  Comments:  No additional relevant information.  Plan of Care    Imaging Orders     DG C-Arm 1-60 Min-No Report  Procedure Orders     Lumbar Epidural Injection     Lumbar Transforaminal Epidural  Medications ordered for procedure: Meds ordered this encounter  Medications  . lidocaine (XYLOCAINE) 2 % (with pres) injection 400 mg  . midazolam (VERSED) 5 MG/5ML injection 1-2 mg    Make sure Flumazenil is available in the pyxis when using this medication. If oversedation occurs, administer 0.2 mg IV over 15 sec. If after 45 sec no response, administer 0.2 mg again over 1 min; may repeat at 1 min intervals; not to exceed 4 doses (1 mg)  . fentaNYL (SUBLIMAZE) injection 25-50 mcg    Make  sure Narcan is available in the pyxis when using this medication. In the event of respiratory depression (RR< 8/min): Titrate NARCAN (naloxone) in increments of 0.1 to 0.2 mg IV at 2-3 minute intervals, until desired degree of reversal.  . lactated ringers infusion 1,000 mL  . sodium chloride flush (NS) 0.9 % injection 1 mL  . ropivacaine (PF) 2 mg/mL (0.2%) (NAROPIN) injection 1 mL  . dexamethasone (DECADRON) injection 10 mg  . sodium chloride flush (NS) 0.9 % injection 1 mL  . ropivacaine (PF) 2 mg/mL (0.2%) (NAROPIN) injection 1 mL  . dexamethasone (DECADRON) injection 10 mg  . sodium chloride flush (NS) 0.9 % injection 2 mL  . ropivacaine (PF) 2 mg/mL (0.2%) (NAROPIN) injection 2 mL  . triamcinolone acetonide (KENALOG-40) injection 40 mg   Medications administered: We administered lidocaine, midazolam, fentaNYL, lactated ringers, sodium chloride flush, ropivacaine (PF) 2 mg/mL (0.2%), dexamethasone, sodium chloride flush, ropivacaine (PF) 2 mg/mL (0.2%), dexamethasone, sodium chloride flush, ropivacaine (PF) 2 mg/mL (0.2%), and triamcinolone acetonide.  See the medical record for exact dosing, route, and time of administration.  Disposition: Discharge home  Discharge Date & Time: 01/27/2018; 1443 hrs.   Physician-requested Follow-up: Return for post-procedure eval (2 wks), w/ Dr. Dossie Arbour.  Future Appointments  Date Time Provider [place]  01/28/2018  9:15 AM [facility] None  02/04/2018  9:15 AM [facility]SE [facility] None  02/11/2018  9:15 AM [facility] None  02/14/2018 11:15 AM Milinda Pointer, MD [facility] None  02/18/2018  9:15 AM Kris Hartmann, NP [facility] None   Primary Care Physician: Theotis Burrow, MD Location: [facility] Outpatient Pain Management Facility Note by: Gaspar Cola, MD Date: 01/27/2018; Time: 2:45 PM  Disclaimer:  Medicine is not an Chief Strategy Officer. The only guarantee in medicine is that nothing is guaranteed. It is  important to note that the decision to proceed with this intervention was based on the information collected from the patient. The Data and conclusions were  drawn from the patient's questionnaire, the interview, and the physical examination. Because the information was provided in large part by the patient, it cannot be guaranteed that it has not been purposely or unconsciously manipulated. Every effort has been made to obtain as much relevant data as possible for this evaluation. It is important to note that the conclusions that lead to this procedure are derived in large part from the available data. Always take into account that the treatment will also be dependent on availability of resources and existing treatment guidelines, considered by other Pain Management Practitioners as being common knowledge and practice, at the time of the intervention. For Medico-Legal purposes, it is also important to point out that variation in procedural techniques and pharmacological choices are the acceptable norm. The indications, contraindications, technique, and results of the above procedure should only be interpreted and judged by a Board-Certified Interventional Pain Specialist with extensive familiarity and expertise in the same exact procedure and technique.

## 2018-01-27 NOTE — Progress Notes (Signed)
Safety precautions to be maintained throughout the outpatient stay will include: orient to surroundings, keep bed in low position, maintain call bell within reach at all times, provide assistance with transfer out of bed and ambulation.  

## 2018-01-27 NOTE — Patient Instructions (Signed)

## 2018-01-28 ENCOUNTER — Encounter (INDEPENDENT_AMBULATORY_CARE_PROVIDER_SITE_OTHER): Payer: Self-pay | Admitting: Nurse Practitioner

## 2018-01-28 ENCOUNTER — Telehealth: Payer: Self-pay

## 2018-01-28 ENCOUNTER — Ambulatory Visit (INDEPENDENT_AMBULATORY_CARE_PROVIDER_SITE_OTHER): Payer: Medicaid Other | Admitting: Nurse Practitioner

## 2018-01-28 VITALS — BP 171/91 | HR 128

## 2018-01-28 DIAGNOSIS — L97211 Non-pressure chronic ulcer of right calf limited to breakdown of skin: Secondary | ICD-10-CM | POA: Diagnosis not present

## 2018-01-28 NOTE — Progress Notes (Signed)
History of Present Illness  There is no documented history at this time  Assessments & Plan   There are no diagnoses linked to this encounter.    Additional instructions  Subjective:  Patient presents with venous ulcer of the Right lower extremity.    Procedure:  3 layer unna wrap was placed Right lower extremity.   Plan:   Follow up in one week.   

## 2018-02-04 ENCOUNTER — Encounter (INDEPENDENT_AMBULATORY_CARE_PROVIDER_SITE_OTHER): Payer: Self-pay

## 2018-02-04 ENCOUNTER — Ambulatory Visit (INDEPENDENT_AMBULATORY_CARE_PROVIDER_SITE_OTHER): Payer: Medicaid Other | Admitting: Nurse Practitioner

## 2018-02-04 VITALS — BP 149/93 | HR 94 | Resp 16 | Ht 69.0 in | Wt 319.2 lb

## 2018-02-04 DIAGNOSIS — L97211 Non-pressure chronic ulcer of right calf limited to breakdown of skin: Secondary | ICD-10-CM

## 2018-02-04 NOTE — Progress Notes (Signed)
History of Present Illness  There is no documented history at this time  Assessments & Plan   There are no diagnoses linked to this encounter.    Additional instructions  Subjective:  Patient presents with venous ulcer of the Right lower extremity.    Procedure:  3 layer unna wrap was placed Right lower extremity.   Plan:   Follow up in one week.   

## 2018-02-10 NOTE — Progress Notes (Signed)
Patient's Name: [name]  MRN: [id number]  Referring Provider: Theotis Burrow*  DOB: 1965-07-28  PCP: Theotis Burrow, MD  DOS: 02/14/2018  Note by: Gaspar Cola, MD  Service setting: Ambulatory outpatient  Specialty: Interventional Pain Management  Location: [facility] (AMB) Pain Management Facility    Patient type: Established   Primary Reason(s) for Visit: Encounter for post-procedure evaluation of chronic illness with mild to moderate exacerbation CC: Back Pain (low left is worse)  HPI  [name] is a 53 y.o. year old, male patient, who comes today for a post-procedure evaluation. He has Cellulitis; Essential hypertension, benign; Hyperlipidemia; Tobacco dependence; Obesity; Lower limb ulcer, ankle, left, with fat layer exposed ([place]); Lymphedema; Barrett's esophagus without dysplasia; Colon cancer screening; Chronic low back pain (Primary Area of Pain) (Bilateral) (R>L) w/ sciatica (Bilateral); Chronic lower extremity pain (Secondary Area of Pain) (Bilateral) (R>L); Chronic pain syndrome; Pharmacologic therapy; Disorder of skeletal system; Problems influencing health status; Adjustment disorder with mixed disturbance of emotions and conduct; Grief; DDD (degenerative disc disease), lumbar; Lumbar facet hypertrophy (Bilateral); Lumbar central spinal stenosis (L4-5); Lumbar lateral recess stenosis (L4-5) (Bilateral) (L>R); Vitamin D insufficiency; Lumbar spondylosis; DM2 (diabetes mellitus, type 2) ([place]); Neurogenic bladder; Spondylosis without myelopathy or radiculopathy, lumbar region; Lumbar facet syndrome (Bilateral) (R>L); Swelling of limb; Lower limb ulcer, calf, right, limited to breakdown of skin ([place]); Morbid obesity with BMI of 45.0-49.9, adult ([place]); History of allergy to shellfish; Osteoarthritis of facet joint of lumbar spine; Osteoarthritis involving multiple joints; Chronic musculoskeletal pain; Neurogenic pain; and Abnormal MRI, lumbar spine on their problem list.  His primarily concern today is the Back Pain (low left is worse)  Pain Assessment: Location: Lower Back Radiating: legs posterior Onset: More than a month ago Duration: Chronic pain Quality: Burning, Constant, Throbbing Severity: 4 /10 (subjective, self-reported pain score)  Note: Reported level is compatible with observation.                         When using our objective Pain Scale, levels between 6 and 10/10 are said to belong in an emergency room, as it progressively worsens from a 6/10, described as severely limiting, requiring emergency care not usually available at an outpatient pain management facility. At a 6/10 level, communication becomes difficult and requires great effort. Assistance to reach the emergency department may be required. Facial flushing and profuse sweating along with potentially dangerous increases in heart rate and blood pressure will be evident. Timing: Constant Modifying factors: procedures for a while BP: 132/69  HR: 97  [name] comes in today for post-procedure evaluation.  Further details on both, my assessment(s), as well as the proposed treatment plan, please see below.  Post-Procedure Assessment  01/27/2018 Procedure: Diagnostic bilateral L4 transforaminal LESI #1 + Midline L3-4 interlaminar LESI #1 under fluoroscopic guidance and IV sedation Pre-procedure pain score:  4/10 Post-procedure pain score: 0/10 (100% relief) Influential Factors: BMI: 45.93 kg/m Intra-procedural challenges: None observed.         Assessment challenges: None detected.              Reported side-effects: None.        Post-procedural adverse reactions or complications: None reported         Sedation: Sedation provided. When no sedatives are used, the analgesic levels obtained are directly associated to the effectiveness of the local anesthetics. However, when sedation is provided, the level of analgesia obtained during the initial 1 hour following the  intervention, is  believed to be the result of a combination of factors. These factors may include, but are not limited to: 1. The effectiveness of the local anesthetics used. 2. The effects of the analgesic(s) and/or anxiolytic(s) used. 3. The degree of discomfort experienced by the patient at the time of the procedure. 4. The patients ability and reliability in recalling and recording the events. 5. The presence and influence of possible secondary gains and/or psychosocial factors. Reported result: Relief experienced during the 1st hour after the procedure: 100 % (Ultra-Short Term Relief)            Interpretative annotation: Clinically appropriate result. Analgesia during this period is likely to be Local Anesthetic and/or IV Sedative (Analgesic/Anxiolytic) related.          Effects of local anesthetic: The analgesic effects attained during this period are directly associated to the localized infiltration of local anesthetics and therefore cary significant diagnostic value as to the etiological location, or anatomical origin, of the pain. Expected duration of relief is directly dependent on the pharmacodynamics of the local anesthetic used. Long-acting (4-6 hours) anesthetics used.  Reported result: Relief during the next 4 to 6 hour after the procedure: 100 % (Short-Term Relief)            Interpretative annotation: Clinically appropriate result. Analgesia during this period is likely to be Local Anesthetic-related.          Long-term benefit: Defined as the period of time past the expected duration of local anesthetics (1 hour for short-acting and 4-6 hours for long-acting). With the possible exception of prolonged sympathetic blockade from the local anesthetics, benefits during this period are typically attributed to, or associated with, other factors such as analgesic sensory neuropraxia, antiinflammatory effects, or beneficial biochemical changes provided by agents other than the local anesthetics.  Reported  result: Extended relief following procedure: 50 %(for 2 days then returned to pre-procedure pain) (Long-Term Relief)            Interpretative annotation: Clinically possible results. Good relief. No permanent benefit expected. Inflammation plays a part in the etiology to the pain. Etiology is likely inflammatory and mechanical.  Current benefits: Defined as reported results that persistent at this point in time.   Analgesia: 25-50 % [name] reports improvement of extremity symptoms.  He still having pain in the lower back. Function: Somewhat improved ROM: Somewhat improved Interpretative annotation: Ongoing benefit. Therapeutic benefit observed. Results would suggest further treatment needed.          Interpretation: Results would suggest a successful therapeutic intervention.                  Plan:  Proceed with treatment No.: 2          Laboratory Chemistry  Inflammation Markers (CRP: Acute Phase) (ESR: Chronic Phase) Lab Results  Component Value Date   CRP 5 10/07/2017   ESRSEDRATE 23 10/07/2017                         Rheumatology Markers No results found.  Renal Markers Lab Results  Component Value Date   BUN 12 10/15/2017   CREATININE 1.04 10/15/2017   BCR 9 10/07/2017   GFRAA >60 10/15/2017   GFRNONAA >60 10/15/2017                             Hepatic Markers Lab Results  Component Value Date  AST 21 10/15/2017   ALT 23 10/15/2017   ALBUMIN 4.0 10/15/2017                        Neuropathy Markers Lab Results  Component Value Date   VITAMINB12 466 10/07/2017                        Hematology Parameters Lab Results  Component Value Date   PLT 200 10/15/2017   HGB 14.6 10/15/2017   HCT 43.1 10/15/2017                        CV Markers No results found.  Note: Lab results reviewed.  Recent Imaging Results   Results for orders placed in visit on 01/27/18  DG C-Arm 1-60 Min-No Report   Narrative Fluoroscopy was utilized by the requesting  physician.  No radiographic  interpretation.    Interpretation Report: Fluoroscopy was used during the procedure to assist with needle guidance. The images were interpreted intraoperatively by the requesting physician.  Meds   Current Outpatient Medications:  .  albuterol (VENTOLIN HFA) 108 (90 Base) MCG/ACT inhaler, Inhale 2 puffs into the lungs every 6 (six) hours as needed for wheezing or shortness of breath., Disp: , Rfl:  .  atorvastatin (LIPITOR) 20 MG tablet, Take 20 mg by mouth at bedtime. , Disp: , Rfl:  .  buPROPion (WELLBUTRIN SR) 150 MG 12 hr tablet, Take 150 mg by mouth 2 (two) times daily., Disp: , Rfl:  .  Calcium Carbonate-Vit D-Min (GNP CALCIUM 1200) 1200-1000 MG-UNIT CHEW, Chew 1,200 mg by mouth daily with breakfast. Take in combination with vitamin D and magnesium., Disp: 30 tablet, Rfl: 5 .  Cholecalciferol (VITAMIN D3) 5000 units CAPS, Take 1 capsule (5,000 Units total) by mouth daily with breakfast. Take along with calcium and magnesium., Disp: 30 capsule, Rfl: 5 .  Fluticasone-Salmeterol (ADVAIR DISKUS) 100-50 MCG/DOSE AEPB, Inhale 1 puff into the lungs 2 (two) times daily. , Disp: , Rfl:  .  furosemide (LASIX) 40 MG tablet, Take 1 tablet (40 mg total) by mouth daily., Disp: 30 tablet, Rfl: 0 .  hydrochlorothiazide (MICROZIDE) 12.5 MG capsule, Take 12.5 mg by mouth daily. , Disp: , Rfl: 0 .  loratadine (CLARITIN) 10 MG tablet, Take 10 mg by mouth daily., Disp: , Rfl:  .  losartan (COZAAR) 50 MG tablet, Take 1 tablet (50 mg total) by mouth daily., Disp: 30 tablet, Rfl: 0 .  Magnesium 500 MG CAPS, Take 1 capsule (500 mg total) by mouth 2 (two) times daily at 8 am and 10 pm., Disp: 60 capsule, Rfl: 5 .  metFORMIN (GLUCOPHAGE) 1000 MG tablet, Take 1,000 mg by mouth 2 (two) times daily with a meal., Disp: , Rfl:  .  oxybutynin (DITROPAN) 5 MG tablet, Take 5 mg by mouth every 12 (twelve) hours., Disp: , Rfl:  .  pantoprazole (PROTONIX) 40 MG tablet, Take 40 mg by mouth 2 (two)  times daily. , Disp: , Rfl:  .  meloxicam (MOBIC) 15 MG tablet, Take 1 tablet (15 mg total) by mouth daily for 30 days., Disp: 30 tablet, Rfl: 0 .  tiZANidine (ZANAFLEX) 4 MG tablet, Take 1 tablet (4 mg total) by mouth every 8 (eight) hours as needed for up to 30 days for muscle spasms., Disp: 90 tablet, Rfl: 0  ROS  Constitutional: Denies any fever or chills Gastrointestinal: No reported hemesis, hematochezia, vomiting,  or acute GI distress Musculoskeletal: Denies any acute onset joint swelling, redness, loss of ROM, or weakness Neurological: No reported episodes of acute onset apraxia, aphasia, dysarthria, agnosia, amnesia, paralysis, loss of coordination, or loss of consciousness  Allergies  [name] is allergic to shellfish allergy; codeine; and lisinopril.  [place]  Drug: [name]  reports no history of drug use. Alcohol:  reports no history of alcohol use. Tobacco:  reports that he has been smoking cigarettes. He has a 30.00 pack-year smoking history. He has never used smokeless tobacco. Medical:  has a past medical history of COPD (chronic obstructive pulmonary disease) ([place]), Coronary artery disease, Diabetes mellitus without complication ([place]), Hypertension, Peptic ulcer, and Sleep apnea. Surgical: [name]  has a past surgical history that includes Esophagogastroduodenoscopy (egd) with propofol (N/A, 11/09/2016); Colonoscopy with propofol (N/A, 11/09/2016); Colonoscopy with propofol (N/A, 07/06/2017); and Esophagogastroduodenoscopy (egd) with propofol (N/A, 07/06/2017). Family: family history includes Diabetes in his father and mother; Stomach cancer in his father.  Constitutional Exam  General appearance: Well nourished, well developed, and well hydrated. In no apparent acute distress Vitals:   02/14/18 1059  BP: 132/69  Pulse: 97  Resp: 18  Temp: 98.3 F (36.8 C)  TempSrc: Oral  SpO2: 97%  Weight: (!) 311 lb (141.1 kg)  Height: 5' 9"  (1.753 m)   BMI Assessment:  Estimated body mass index is 45.93 kg/m as calculated from the following:   Height as of this encounter: 5' 9"  (1.753 m).   Weight as of this encounter: 311 lb (141.1 kg).  BMI interpretation table: BMI level Category Range association with higher incidence of chronic pain  <18 kg/m2 Underweight   18.5-24.9 kg/m2 Ideal body weight   25-29.9 kg/m2 Overweight Increased incidence by 20%  30-34.9 kg/m2 Obese (Class I) Increased incidence by 68%  35-39.9 kg/m2 Severe obesity (Class II) Increased incidence by 136%  >40 kg/m2 Extreme obesity (Class III) Increased incidence by 254%   Patient's current BMI Ideal Body weight  Body mass index is 45.93 kg/m. Ideal body weight: 70.7 kg (155 lb 13.8 oz) Adjusted ideal body weight: 98.8 kg (217 lb 14.7 oz)   BMI Readings from Last 4 Encounters:  02/14/18 45.93 kg/m  02/11/18 46.52 kg/m  02/04/18 47.14 kg/m  01/27/18 45.93 kg/m   Wt Readings from Last 4 Encounters:  02/14/18 (!) 311 lb (141.1 kg)  02/11/18 (!) 315 lb (142.9 kg)  02/04/18 (!) 319 lb 3.2 oz (144.8 kg)  01/27/18 (!) 311 lb (141.1 kg)  Psych/Mental status: Alert, oriented x 3 (person, place, & time)       Eyes: PERLA Respiratory: No evidence of acute respiratory distress  Cervical Spine Area Exam  Skin & Axial Inspection: No masses, redness, edema, swelling, or associated skin lesions Alignment: Symmetrical Functional ROM: Unrestricted ROM      Stability: No instability detected Muscle Tone/Strength: Functionally intact. No obvious neuro-muscular anomalies detected. Sensory (Neurological): Unimpaired Palpation: No palpable anomalies              Upper Extremity (UE) Exam    Side: Right upper extremity  Side: Left upper extremity  Skin & Extremity Inspection: Skin color, temperature, and hair growth are WNL. No peripheral edema or cyanosis. No masses, redness, swelling, asymmetry, or associated skin lesions. No contractures.  Skin & Extremity Inspection: Skin color,  temperature, and hair growth are WNL. No peripheral edema or cyanosis. No masses, redness, swelling, asymmetry, or associated skin lesions. No contractures.  Functional ROM: Unrestricted ROM  Functional ROM: Unrestricted ROM          Muscle Tone/Strength: Functionally intact. No obvious neuro-muscular anomalies detected.  Muscle Tone/Strength: Functionally intact. No obvious neuro-muscular anomalies detected.  Sensory (Neurological): Unimpaired          Sensory (Neurological): Unimpaired          Palpation: No palpable anomalies              Palpation: No palpable anomalies              Provocative Test(s):  Phalen's test: deferred Tinel's test: deferred Apley's scratch test (touch opposite shoulder):  Action 1 (Across chest): deferred Action 2 (Overhead): deferred Action 3 (LB reach): deferred   Provocative Test(s):  Phalen's test: deferred Tinel's test: deferred Apley's scratch test (touch opposite shoulder):  Action 1 (Across chest): deferred Action 2 (Overhead): deferred Action 3 (LB reach): deferred    Thoracic Spine Area Exam  Skin & Axial Inspection: No masses, redness, or swelling Alignment: Symmetrical Functional ROM: Unrestricted ROM Stability: No instability detected Muscle Tone/Strength: Functionally intact. No obvious neuro-muscular anomalies detected. Sensory (Neurological): Unimpaired Muscle strength & Tone: No palpable anomalies  Lumbar Spine Area Exam  Skin & Axial Inspection: No masses, redness, or swelling Alignment: Symmetrical Functional ROM: Decreased ROM       Stability: No instability detected Muscle Tone/Strength: Increased muscle tone over affected area Sensory (Neurological): Movement-associated pain Palpation: Uncomfortable       Provocative Tests: Hyperextension/rotation test: (+) bilaterally for facet joint pain. Lumbar quadrant test (Kemp's test): (+) bilaterally for facet joint pain. Lateral bending test: deferred today       Patrick's  Maneuver: deferred today                   FABER* test: deferred today                   S-I anterior distraction/compression test: deferred today         S-I lateral compression test: deferred today         S-I Thigh-thrust test: deferred today         S-I Gaenslen's test: deferred today         *(Flexion, ABduction and External Rotation)  Gait & Posture Assessment  Ambulation: Unassisted Gait: Modified gait pattern (slower gait speed, wider stride width, and longer stance duration) associated with morbid obesity Posture: WNL   Lower Extremity Exam    Side: Right lower extremity  Side: Left lower extremity  Stability: No instability observed          Stability: No instability observed          Skin & Extremity Inspection: Skin color, temperature, and hair growth are WNL. No peripheral edema or cyanosis. No masses, redness, swelling, asymmetry, or associated skin lesions. No contractures.  Skin & Extremity Inspection: Skin color, temperature, and hair growth are WNL. No peripheral edema or cyanosis. No masses, redness, swelling, asymmetry, or associated skin lesions. No contractures.  Functional ROM: Unrestricted ROM                  Functional ROM: Unrestricted ROM                  Muscle Tone/Strength: Functionally intact. No obvious neuro-muscular anomalies detected.  Muscle Tone/Strength: Functionally intact. No obvious neuro-muscular anomalies detected.  Sensory (Neurological): Unimpaired        Sensory (Neurological): Unimpaired        DTR: Patellar:  deferred today Achilles: deferred today Plantar: deferred today  DTR: Patellar: deferred today Achilles: deferred today Plantar: deferred today  Palpation: No palpable anomalies  Palpation: No palpable anomalies   Assessment  Primary Diagnosis & Pertinent Problem List: The primary encounter diagnosis was Chronic low back pain (Primary Area of Pain) (Bilateral) (R>L) w/ sciatica (Bilateral). Diagnoses of Chronic lower extremity pain  (Secondary Area of Pain) (Bilateral) (R>L), Lumbar facet syndrome (Bilateral) (R>L), Morbid obesity with BMI of 45.0-49.9, adult (HCC), Osteoarthritis of facet joint of lumbar spine, Osteoarthritis involving multiple joints, Chronic musculoskeletal pain, Neurogenic pain, Lumbar central spinal stenosis (L4-5), DDD (degenerative disc disease), lumbar, and Abnormal MRI, lumbar spine were also pertinent to this visit.  Status Diagnosis  Persistent Improving Persistent 1. Chronic low back pain (Primary Area of Pain) (Bilateral) (R>L) w/ sciatica (Bilateral)   2. Chronic lower extremity pain (Secondary Area of Pain) (Bilateral) (R>L)   3. Lumbar facet syndrome (Bilateral) (R>L)   4. Morbid obesity with BMI of 45.0-49.9, adult ([place])   5. Osteoarthritis of facet joint of lumbar spine   6. Osteoarthritis involving multiple joints   7. Chronic musculoskeletal pain   8. Neurogenic pain   9. Lumbar central spinal stenosis (L4-5)   10. DDD (degenerative disc disease), lumbar   11. Abnormal MRI, lumbar spine     Problems updated and reviewed during this visit: Problem  Osteoarthritis of Facet Joint of Lumbar Spine  Osteoarthritis involving multiple joints  Chronic Musculoskeletal Pain  Neurogenic Pain  Abnormal Mri, Lumbar Spine   FINDINGS: Segmentation: Conventional anatomy assumed, with the last open disc space designated L5-S1. Alignment:  Normal. Vertebrae: No worrisome osseous lesion, acute fracture or pars defect. 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 The visualized sacroiliac joints appear unremarkable. Conus medullaris: Extends to the L1 level and appears normal. Paraspinal and other soft tissues: No significant paraspinal findings.  Disc levels: From T12-L1 through L3-4, the discs are well hydrated with maintained height. There is no disc herniation, spinal stenosis or nerve root encroachment. Mild facet hypertrophy is present at L3-4. L4-5: Disc desiccation with  minimal loss of height. There is a small central disc protrusion with mild facet and ligamentous hypertrophy. Superimposed on congenital factors, there is resulting mild spinal stenosis and mild narrowing of the lateral recesses, left greater than right. The foramina are sufficiently patent. L5-S1: Disc desiccation with mild loss of height. There is a moderate size central disc extrusion with mass effect on the thecal sac and probable encroachment on both S1 nerve roots in the lateral recesses. There is mild bilateral facet hypertrophy. There is mild left foraminal narrowing.  IMPRESSION: 1. Moderate size central disc extrusion at L5-S1 with mass effect on the thecal sac and probable encroachment on both S1 nerve roots in the lateral recesses. 2. Small central disc protrusion at L4-5. Superimposed on congenital factors, there is mild resulting spinal stenosis and mild narrowing of the lateral recesses, left greater than right. Findings at this level could be symptomatic as well. 3. No other significant acquired findings. 4. Congenitally short pedicles.    Plan of Care  Pharmacotherapy (Medications Ordered): Meds ordered this encounter  Medications  . meloxicam (MOBIC) 15 MG tablet    Sig: Take 1 tablet (15 mg total) by mouth daily for 30 days.    Dispense:  30 tablet    Refill:  0    Do not add this medication to the electronic "Automatic Refill" notification system. Patient may have prescription filled  one day early if pharmacy is closed on scheduled refill date.  Marland Kitchen tiZANidine (ZANAFLEX) 4 MG tablet    Sig: Take 1 tablet (4 mg total) by mouth every 8 (eight) hours as needed for up to 30 days for muscle spasms.    Dispense:  90 tablet    Refill:  0    Do not place this medication, or any other prescription from our practice, on "Automatic Refill". Patient may have prescription filled one day early if pharmacy is closed on scheduled refill date.   Medications administered today: [name] had no medications administered during this visit.   Procedure Orders     Lumbar Epidural Injection     Lumbar Transforaminal Epidural Lab Orders  No laboratory test(s) ordered today   Imaging Orders  No imaging studies ordered today    Referral Orders     Amb Ref to Medical Weight Management     Amb Referral to Bariatric Surgery Interventional management options: Planned, scheduled, and/or pending:   NOTE:NO RFAuntil BMI is <35 Diagnostic bilateral L4 transforaminal LESI #2 + Midline L3-4 interlaminar LESI #2 under fluoroscopic guidance and IV sedation. Today I had a long conversation with the patient regarding his weight.  We will go ahead and arrange for referral to a medically supervised weight management program.  I am also going to get him a referral to bariatric surgery.  His current BMI of 45.93 and we need to work in bringing this down to less than 30.  However, once he brings it down to at least 35, then we may be able to do the radiofrequency ablation.   Considering:   Diagnostic bilateral L4 transforaminal LESI #1  Diagnostic (Midline) L3-4 LESI #1  Diagnostic bilateral lumbar facet nerve block #3 Possible bilateral lumbar facetRFA   Palliative PRN treatment(s):   Diagnostic bilateral lumbar facet block #3 under fluoroscopic guidance and IV sedation    Provider-requested follow-up: Return for Procedure (w/ sedation): (B) L4 TFESI #2 + (ML) L3-4 LESI #2.  Future Appointments  Date Time Provider [place]  02/18/2018  9:15 AM Kris Hartmann, NP [facility] None  03/01/2018 12:45 PM Milinda Pointer, MD [facility] None   Primary Care Physician: Theotis Burrow, MD Location: [facility] Outpatient Pain Management Facility Note by: Gaspar Cola, MD Date: 02/14/2018; Time: 11:44 AM

## 2018-02-11 ENCOUNTER — Ambulatory Visit (INDEPENDENT_AMBULATORY_CARE_PROVIDER_SITE_OTHER): Payer: Medicaid Other | Admitting: Vascular Surgery

## 2018-02-11 ENCOUNTER — Encounter (INDEPENDENT_AMBULATORY_CARE_PROVIDER_SITE_OTHER): Payer: Self-pay

## 2018-02-11 VITALS — BP 170/111 | HR 102 | Ht 69.0 in | Wt 315.0 lb

## 2018-02-11 DIAGNOSIS — I890 Lymphedema, not elsewhere classified: Secondary | ICD-10-CM | POA: Diagnosis not present

## 2018-02-11 DIAGNOSIS — M7989 Other specified soft tissue disorders: Secondary | ICD-10-CM

## 2018-02-11 DIAGNOSIS — R600 Localized edema: Secondary | ICD-10-CM

## 2018-02-11 NOTE — Progress Notes (Signed)
History of Present Illness  There is no documented history at this time  Assessments & Plan   There are no diagnoses linked to this encounter.    Additional instructions  Subjective:  Patient presents with venous ulcer of the Right lower extremity.    Procedure:  3 layer unna wrap was placed Right lower extremity.   Plan:   Follow up in one week.   

## 2018-02-14 ENCOUNTER — Other Ambulatory Visit: Payer: Self-pay

## 2018-02-14 ENCOUNTER — Ambulatory Visit: Payer: Medicaid Other | Attending: Pain Medicine | Admitting: Pain Medicine

## 2018-02-14 ENCOUNTER — Encounter: Payer: Self-pay | Admitting: Pain Medicine

## 2018-02-14 VITALS — BP 132/69 | HR 97 | Temp 98.3°F | Resp 18 | Ht 69.0 in | Wt 311.0 lb

## 2018-02-14 DIAGNOSIS — M792 Neuralgia and neuritis, unspecified: Secondary | ICD-10-CM | POA: Diagnosis present

## 2018-02-14 DIAGNOSIS — Z6841 Body Mass Index (BMI) 40.0 and over, adult: Secondary | ICD-10-CM | POA: Diagnosis present

## 2018-02-14 DIAGNOSIS — M5442 Lumbago with sciatica, left side: Secondary | ICD-10-CM

## 2018-02-14 DIAGNOSIS — G8929 Other chronic pain: Secondary | ICD-10-CM | POA: Diagnosis present

## 2018-02-14 DIAGNOSIS — M48061 Spinal stenosis, lumbar region without neurogenic claudication: Secondary | ICD-10-CM | POA: Insufficient documentation

## 2018-02-14 DIAGNOSIS — R937 Abnormal findings on diagnostic imaging of other parts of musculoskeletal system: Secondary | ICD-10-CM | POA: Insufficient documentation

## 2018-02-14 DIAGNOSIS — M79605 Pain in left leg: Secondary | ICD-10-CM | POA: Insufficient documentation

## 2018-02-14 DIAGNOSIS — M150 Primary generalized (osteo)arthritis: Secondary | ICD-10-CM | POA: Insufficient documentation

## 2018-02-14 DIAGNOSIS — M5441 Lumbago with sciatica, right side: Secondary | ICD-10-CM | POA: Insufficient documentation

## 2018-02-14 DIAGNOSIS — M7918 Myalgia, other site: Secondary | ICD-10-CM | POA: Insufficient documentation

## 2018-02-14 DIAGNOSIS — M159 Polyosteoarthritis, unspecified: Secondary | ICD-10-CM

## 2018-02-14 DIAGNOSIS — M79604 Pain in right leg: Secondary | ICD-10-CM | POA: Diagnosis present

## 2018-02-14 DIAGNOSIS — M47816 Spondylosis without myelopathy or radiculopathy, lumbar region: Secondary | ICD-10-CM | POA: Diagnosis not present

## 2018-02-14 DIAGNOSIS — M5136 Other intervertebral disc degeneration, lumbar region: Secondary | ICD-10-CM | POA: Diagnosis present

## 2018-02-14 MED ORDER — MELOXICAM 15 MG PO TABS: 15.0000 mg | ORAL_TABLET | Freq: Every day | ORAL | 0 refills | Status: DC

## 2018-02-14 MED ORDER — TIZANIDINE HCL 4 MG PO TABS: 4.0000 mg | ORAL_TABLET | Freq: Three times a day (TID) | ORAL | 0 refills | Status: DC | PRN

## 2018-02-14 NOTE — Patient Instructions (Addendum)
____________________________________________________________________________________________  Preparing for Procedure with Sedation  Instructions: . Oral Intake: Do not eat or drink anything for at least 8 hours prior to your procedure. . Transportation: Public transportation is not allowed. Bring an adult driver. The driver must be physically present in our waiting room before any procedure can be started. . Physical Assistance: Bring an adult physically capable of assisting you, in the event you need help. This adult should keep you company at home for at least 6 hours after the procedure. . Blood Pressure Medicine: Take your blood pressure medicine with a sip of water the morning of the procedure. . Blood thinners: Notify our staff if you are taking any blood thinners. Depending on which one you take, there will be specific instructions on how and when to stop it. . Diabetics on insulin: Notify the staff so that you can be scheduled 1st case in the morning. If your diabetes requires high dose insulin, take only  of your normal insulin dose the morning of the procedure and notify the staff that you have done so. . Preventing infections: Shower with an antibacterial soap the morning of your procedure. . Build-up your immune system: Take 1000 mg of Vitamin C with every meal (3 times a day) the day prior to your procedure. . Antibiotics: Inform the staff if you have a condition or reason that requires you to take antibiotics before dental procedures. . Pregnancy: If you are pregnant, call and cancel the procedure. . Sickness: If you have a cold, fever, or any active infections, call and cancel the procedure. . Arrival: You must be in the facility at least 30 minutes prior to your scheduled procedure. . Children: Do not bring children with you. . Dress appropriately: Bring dark clothing that you would not mind if they get stained. . Valuables: Do not bring any jewelry or valuables.  Procedure  appointments are reserved for interventional treatments only. . No Prescription Refills. . No medication changes will be discussed during procedure appointments. . No disability issues will be discussed.  Reasons to call and reschedule or cancel your procedure: (Following these recommendations will minimize the risk of a serious complication.) . Surgeries: Avoid having procedures within 2 weeks of any surgery. (Avoid for 2 weeks before or after any surgery). . Flu Shots: Avoid having procedures within 2 weeks of a flu shots or . (Avoid for 2 weeks before or after immunizations). . Barium: Avoid having a procedure within 7-10 days after having had a radiological study involving the use of radiological contrast. (Myelograms, Barium swallow or enema study). . Heart attacks: Avoid any elective procedures or surgeries for the initial 6 months after a "Myocardial Infarction" (Heart Attack). . Blood thinners: It is imperative that you stop these medications before procedures. Let us know if you if you take any blood thinner.  . Infection: Avoid procedures during or within two weeks of an infection (including chest colds or gastrointestinal problems). Symptoms associated with infections include: Localized redness, fever, chills, night sweats or profuse sweating, burning sensation when voiding, cough, congestion, stuffiness, runny nose, sore throat, diarrhea, nausea, vomiting, cold or Flu symptoms, recent or current infections. It is specially important if the infection is over the area that we intend to treat. . Heart and lung problems: Symptoms that may suggest an active cardiopulmonary problem include: cough, chest pain, breathing difficulties or shortness of breath, dizziness, ankle swelling, uncontrolled high or unusually low blood pressure, and/or palpitations. If you are experiencing any of these symptoms, cancel   your procedure and contact your primary care physician for an evaluation.  Remember:   Regular Business hours are:  Monday to Thursday 8:00 AM to 4:00 PM  Provider's Schedule: Milinda Pointer, MD:  Procedure days: Tuesday and Thursday 7:30 AM to 4:00 PM  Gillis Santa, MD:  Procedure days: Monday and Wednesday 7:30 AM to 4:00 PM ____________________________________________________________________________________________     ____________________________________________________________________________________________  Weight Management Required  URGENT: Your weight has been found to be adversely affecting your health.  Dear [name]:  Your current Body mass index is 45.93 kg/m.Marland Kitchen Estimated body mass index is 45.93 kg/m as calculated from the following:   Height as of this encounter: 5\' 9"  (1.753 m).   Weight as of this encounter: 311 lb (141.1 kg).  Your last four (4) weight and BMI calculations are as follows: Wt Readings from Last 4 Encounters:  02/14/18 (!) 311 lb (141.1 kg)  02/11/18 (!) 315 lb (142.9 kg)  02/04/18 (!) 319 lb 3.2 oz (144.8 kg)  01/27/18 (!) 311 lb (141.1 kg)   BMI Readings from Last 4 Encounters:  02/14/18 45.93 kg/m  02/11/18 46.52 kg/m  02/04/18 47.14 kg/m  01/27/18 45.93 kg/m    Calculations estimate your ideal body weight to be: Ideal body weight: 70.7 kg (155 lb 13.8 oz) Adjusted ideal body weight: 98.8 kg (217 lb 14.7 oz)  Please use the table below to identify your weight category and associated incidence of chronic pain, secondary to your weight.  BMI interpretation table: BMI level Category Associated incidence of chronic pain  <18 kg/m2 Underweight   18.5-24.9 kg/m2 Ideal body weight   25-29.9 kg/m2 Overweight  20%  30-34.9 kg/m2 Obese (Class I)  68%  35-39.9 kg/m2 Severe obesity (Class II)  136%  >40 kg/m2 Extreme obesity (Class III)  254%   In addition: You will be considered "Morbidly Obese", if your BMI is above 30 and you have one or more of the following conditions which are known to be directly  associated with obesity: 1. Type 2 Diabetes (Which in turn can lead to cardiovascular diseases (CVD), stroke, peripheral vascular diseases (PVD), retinopathy, nephropathy, and neuropathy) 2. Cardiovascular Disease (High Blood Pressure; Congestive Heart Failure; High Cholesterol; Coronary Artery Disease; Angina; or History of Heart Attacks) 3. Breathing problems (Asthma; obesity-hypoventilation syndrome; obstructive sleep apnea; chronic inflammatory airway disease; reactive airway disease; or shortness of breath) 4. Chronic kidney disease 5. Liver disease (nonalcoholic fatty liver disease) 6. High blood pressure 7. Acid reflux (gastroesophageal reflux disease; heartburn) 8. Osteoarthritis (OA) (with any of the following: hip pain; knee pain; and/or low back pain) 9. Low back pain (Lumbar Facet Syndrome; and/or Degenerative Disc Disease) 10. Hip pain (Osteoarthritis of hip) (For every 1 lbs of added body weight, there is a 2 lbs increase in pressure inside of each hip articulation. 1:2 mechanical relationship) 11. Knee pain (Osteoarthritis of knee) (For every 1 lbs of added body weight, there is a 4 lbs increase in pressure inside of each knee articulation. 1:4 mechanical relationship) (patients with a BMI>30 kg/m2 were 6.8 times more likely to develop knee OA than normal-weight individuals) 12. Certain types of cancer. (Epidemiological studies have shown that obesity is a risk factor for: post-menopausal breast cancer; cancers of the endometrium, colon and kidney cancer; malignant adenomas of the oesophagus. Obese subjects have an approximately 1.5-3.5-fold increased risk of developing these cancers compared with normal-weight subjects, and it has been estimated that between 15 and 45% of these cancers can be attributed to overweight. More  recent studies suggest that obesity may also increase the risk of other types of cancer, including pancreatic, hepatic and gallbladder cancer. Ref: Obesity and cancer.  Pischon T, Nthlings U, [employer] H. Proc Nutr Soc. 2008 May;67(2):128-45. doi: 09.3235/[id number].)  Recommendation: At this point it is urgent that you take a step back and concentrate in loosing weight. Dedicate 100% of your efforts on this task. Nothing else will improve your health more than bringing down your BMI to less than 30. Because most chronic pain patients do have difficulty exercising secondary to their pain, you must rely on proper nutrition and dieting in order to lose the weight. If your BMI is above 40, you should seriously consider bariatric surgery. A realistic goal is to lose 10% of your body weight over a period of 12 months.  If over time you have unsuccessfully try to lose weight, then it is time for you to seek professional help and to enter a medically supervised weight management program.  Pain management considerations:  1. Pharmacological Problems: Be advised that the use of opioid analgesics (oxycodone; hydrocodone; morphine; methadone; codeine; and all of their derivatives) have been associated with decreased metabolism and weight gain.  For this reason, should we see that you are unable to lose weight while taking these medications, it may become necessary for [place] to taper down and indefinitely discontinue them.  2. Technical Problems: The incidence of successful interventional therapies decreases as the patient's BMI increases. It is much more difficult to accomplish a safe and effective interventional therapy on a patient with a BMI above 35. Yours is Body mass index is 45.93 kg/m.Marland Kitchen  3. Radiation Exposure Problems: The x-rays machine, used to accomplish injection therapies, will automatically increase their x-ray output in order to capture an appropriate bone image. This means that radiation exposure increases exponentially with the patient's BMI. (The higher the BMI, the higher the radiation exposure.) Although the level of radiation used at a given time is still safe  to the patient, it is not for the physician and/or assisting staff. Unfortunately, radiation exposure is accumulative. Because physicians and the staff have to do procedures and be exposed on a daily basis, this can result in health problems such as cancer and radiation burns. Radiation exposure to the staff is monitored by the radiation batches that they wear. The exposure levels are reported back to the staff on a quarterly basis. Depending on levels of exposure, physicians and staff may be obligated by law to decrease this exposure. This means that they have the right and obligation to refuse providing therapies where they may be overexposed to radiation. For this reason, physicians may decline to offer therapies such as radiofrequency ablation or implants to patients with a BMI above 40. 4. Current Trends: Be advised that the current trend is to no longer offer certain therapies to patients with a BMI equal to, or above 35, due to increase perioperative risks, increased technical procedural difficulties, and excessive radiation exposure to healthcare personnel. ____________________________________________________________________________________________  Pain Management Discharge Instructions  General Discharge Instructions :  If you need to reach your doctor call: Monday-Friday 8:00 am - 4:00 pm at [phone] or toll free [phone].  After clinic hours [phone] to have operator reach doctor.  Bring all of your medication bottles to all your appointments in the pain clinic.  To cancel or reschedule your appointment with Pain Management please remember to call 24 hours in advance to avoid a fee.  Refer to the educational materials which you  have been given on: General Risks, I had my Procedure. Discharge Instructions, Post Sedation.  Post Procedure Instructions:  The drugs you were given will stay in your system until tomorrow, so for the next 24 hours you should not drive, make any  legal decisions or drink any alcoholic beverages.  You may eat anything you prefer, but it is better to start with liquids then soups and crackers, and gradually work up to solid foods.  Please notify your doctor immediately if you have any unusual bleeding, trouble breathing or pain that is not related to your normal pain.  Depending on the type of procedure that was done, some parts of your body may feel week and/or numb.  This usually clears up by tonight or the next day.  Walk with the use of an assistive device or accompanied by an adult for the 24 hours.  You may use ice on the affected area for the first 24 hours.  Put ice in a Ziploc bag and cover with a towel and place against area 15 minutes on 15 minutes off.  You may switch to heat after 24 hours.GENERAL RISKS AND COMPLICATIONS  What are the risk, side effects and possible complications? Generally speaking, most procedures are safe.  However, with any procedure there are risks, side effects, and the possibility of complications.  The risks and complications are dependent upon the sites that are lesioned, or the type of nerve block to be performed.  The closer the procedure is to the spine, the more serious the risks are.  Great care is taken when placing the radio frequency needles, block needles or lesioning probes, but sometimes complications can occur. 1. Infection: Any time there is an injection through the skin, there is a risk of infection.  This is why sterile conditions are used for these blocks.  There are four possible types of infection. 1. Localized skin infection. 2. Central Nervous System Infection-This can be in the form of Meningitis, which can be deadly. 3. Epidural Infections-This can be in the form of an epidural abscess, which can cause pressure inside of the spine, causing compression of the spinal cord with subsequent paralysis. This would require an emergency surgery to decompress, and there are no guarantees that  the patient would recover from the paralysis. 4. Discitis-This is an infection of the intervertebral discs.  It occurs in about 1% of discography procedures.  It is difficult to treat and it may lead to surgery.        2. Pain: the needles have to go through skin and soft tissues, will cause soreness.       3. Damage to internal structures:  The nerves to be lesioned may be near blood vessels or    other nerves which can be potentially damaged.       4. Bleeding: Bleeding is more common if the patient is taking blood thinners such as  aspirin, Coumadin, Ticiid, Plavix, etc., or if he/she have some genetic predisposition  such as hemophilia. Bleeding into the spinal canal can cause compression of the spinal  cord with subsequent paralysis.  This would require an emergency surgery to  decompress and there are no guarantees that the patient would recover from the  paralysis.       5. Pneumothorax:  Puncturing of a lung is a possibility, every time a needle is introduced in  the area of the chest or upper back.  Pneumothorax refers to free air around the  collapsed lung(s),  inside of the thoracic cavity (chest cavity).  Another two possible  complications related to a similar event would include: Hemothorax and Chylothorax.   These are variations of the Pneumothorax, where instead of air around the collapsed  lung(s), you may have blood or chyle, respectively.       6. Spinal headaches: They may occur with any procedures in the area of the spine.       7. Persistent CSF (Cerebro-Spinal Fluid) leakage: This is a rare problem, but may occur  with prolonged intrathecal or epidural catheters either due to the formation of a fistulous  track or a dural tear.       8. Nerve damage: By working so close to the spinal cord, there is always a possibility of  nerve damage, which could be as serious as a permanent spinal cord injury with  paralysis.       9. Death:  Although rare, severe deadly allergic reactions known  as "Anaphylactic  reaction" can occur to any of the medications used.      10. Worsening of the symptoms:  We can always make thing worse.  What are the chances of something like this happening? Chances of any of this occuring are extremely low.  By statistics, you have more of a chance of getting killed in a motor vehicle accident: while driving to the hospital than any of the above occurring .  Nevertheless, you should be aware that they are possibilities.  In general, it is similar to taking a shower.  Everybody knows that you can slip, hit your head and get killed.  Does that mean that you should not shower again?  Nevertheless always keep in mind that statistics do not mean anything if you happen to be on the wrong side of them.  Even if a procedure has a 1 (one) in a 1,000,000 (million) chance of going wrong, it you happen to be that one..Also, keep in mind that by statistics, you have more of a chance of having something go wrong when taking medications.  Who should not have this procedure? If you are on a blood thinning medication (e.g. Coumadin, Plavix, see list of "Blood Thinners"), or if you have an active infection going on, you should not have the procedure.  If you are taking any blood thinners, please inform your physician.  How should I prepare for this procedure?  Do not eat or drink anything at least six hours prior to the procedure.  Bring a driver with you .  It cannot be a taxi.  Come accompanied by an adult that can drive you back, and that is strong enough to help you if your legs get weak or numb from the local anesthetic.  Take all of your medicines the morning of the procedure with just enough water to swallow them.  If you have diabetes, make sure that you are scheduled to have your procedure done first thing in the morning, whenever possible.  If you have diabetes, take only half of your insulin dose and notify our nurse that you have done so as soon as you arrive at  the clinic.  If you are diabetic, but only take blood sugar pills (oral hypoglycemic), then do not take them on the morning of your procedure.  You may take them after you have had the procedure.  Do not take aspirin or any aspirin-containing medications, at least eleven (11) days prior to the procedure.  They may prolong bleeding.  Wear  loose fitting clothing that may be easy to take off and that you would not mind if it got stained with Betadine or blood.  Do not wear any jewelry or perfume  Remove any nail coloring.  It will interfere with some of our monitoring equipment.  NOTE: Remember that this is not meant to be interpreted as a complete list of all possible complications.  Unforeseen problems may occur.  BLOOD THINNERS The following drugs contain aspirin or other products, which can cause increased bleeding during surgery and should not be taken for 2 weeks prior to and 1 week after surgery.  If you should need take something for relief of minor pain, you may take acetaminophen which is found in Tylenol,m Datril, Anacin-3 and Panadol. It is not blood thinner. The products listed below are.  Do not take any of the products listed below in addition to any listed on your instruction sheet.  A.P.C or A.P.C with Codeine Codeine Phosphate Capsules #3 Ibuprofen Ridaura  ABC compound Congesprin Imuran rimadil  Advil Cope Indocin Robaxisal  Alka-Seltzer Effervescent Pain Reliever and Antacid Coricidin or Coricidin-D  Indomethacin Rufen  Alka-Seltzer plus Cold Medicine Cosprin Ketoprofen S-A-C Tablets  Anacin Analgesic Tablets or Capsules Coumadin Korlgesic Salflex  Anacin Extra Strength Analgesic tablets or capsules CP-2 Tablets Lanoril Salicylate  Anaprox Cuprimine Capsules Levenox Salocol  Anexsia-D Dalteparin Magan Salsalate  Anodynos Darvon compound Magnesium Salicylate Sine-off  Ansaid Dasin Capsules Magsal Sodium Salicylate  Anturane Depen Capsules Marnal Soma  APF Arthritis pain  formula Dewitt's Pills Measurin Stanback  Argesic Dia-Gesic Meclofenamic Sulfinpyrazone  Arthritis Bayer Timed Release Aspirin Diclofenac Meclomen Sulindac  Arthritis pain formula Anacin Dicumarol Medipren Supac  Analgesic (Safety coated) Arthralgen Diffunasal Mefanamic Suprofen  Arthritis Strength Bufferin Dihydrocodeine Mepro Compound Suprol  Arthropan liquid Dopirydamole Methcarbomol with Aspirin Synalgos  ASA tablets/Enseals Disalcid Micrainin Tagament  Ascriptin Doan's Midol Talwin  Ascriptin A/D Dolene Mobidin Tanderil  Ascriptin Extra Strength Dolobid Moblgesic Ticlid  Ascriptin with Codeine Doloprin or Doloprin with Codeine Momentum Tolectin  Asperbuf Duoprin Mono-gesic Trendar  Aspergum Duradyne Motrin or Motrin IB Triminicin  Aspirin plain, buffered or enteric coated Durasal Myochrisine Trigesic  Aspirin Suppositories Easprin Nalfon Trillsate  Aspirin with Codeine Ecotrin Regular or Extra Strength Naprosyn Uracel  Atromid-S Efficin Naproxen Ursinus  Auranofin Capsules Elmiron Neocylate Vanquish  Axotal Emagrin Norgesic Verin  Azathioprine Empirin or Empirin with Codeine Normiflo Vitamin E  Azolid Emprazil Nuprin Voltaren  Bayer Aspirin plain, buffered or children's or timed BC Tablets or powders Encaprin Orgaran Warfarin Sodium  Buff-a-Comp Enoxaparin Orudis Zorpin  Buff-a-Comp with Codeine Equegesic Os-Cal-Gesic   Buffaprin Excedrin plain, buffered or Extra Strength Oxalid   Bufferin Arthritis Strength Feldene Oxphenbutazone   Bufferin plain or Extra Strength Feldene Capsules Oxycodone with Aspirin   Bufferin with Codeine Fenoprofen Fenoprofen Pabalate or Pabalate-SF   Buffets II Flogesic Panagesic   Buffinol plain or Extra Strength Florinal or Florinal with Codeine Panwarfarin   Buf-Tabs Flurbiprofen Penicillamine   Butalbital Compound Four-way cold tablets Penicillin   Butazolidin Fragmin Pepto-Bismol   Carbenicillin Geminisyn Percodan   Carna Arthritis Reliever Geopen  Persantine   Carprofen Gold's salt Persistin   Chloramphenicol Goody's Phenylbutazone   Chloromycetin Haltrain Piroxlcam   Clmetidine heparin Plaquenil   Cllnoril Hyco-pap Ponstel   Clofibrate Hydroxy chloroquine Propoxyphen         Before stopping any of these medications, be sure to consult the physician who ordered them.  Some, such as Coumadin (Warfarin) are ordered to prevent or  treat serious conditions such as "deep thrombosis", "pumonary embolisms", and other heart problems.  The amount of time that you may need off of the medication may also vary with the medication and the reason for which you were taking it.  If you are taking any of these medications, please make sure you notify your pain physician before you undergo any procedures.         Epidural Steroid Injection Patient Information  Description: The epidural space surrounds the nerves as they exit the spinal cord.  In some patients, the nerves can be compressed and inflamed by a bulging disc or a tight spinal canal (spinal stenosis).  By injecting steroids into the epidural space, we can bring irritated nerves into direct contact with a potentially helpful medication.  These steroids act directly on the irritated nerves and can reduce swelling and inflammation which often leads to decreased pain.  Epidural steroids may be injected anywhere along the spine and from the neck to the low back depending upon the location of your pain.   After numbing the skin with local anesthetic (like Novocaine), a small needle is passed into the epidural space slowly.  You may experience a sensation of pressure while this is being done.  The entire block usually last less than 10 minutes.  Conditions which may be treated by epidural steroids:   Low back and leg pain  Neck and arm pain  Spinal stenosis  Post-laminectomy syndrome  Herpes zoster (shingles) pain  Pain from compression fractures  Preparation for the injection:  1. Do  not eat any solid food or dairy products within 8 hours of your appointment.  2. You may drink clear liquids up to 3 hours before appointment.  Clear liquids include water, black coffee, juice or soda.  No milk or cream please. 3. You may take your regular medication, including pain medications, with a sip of water before your appointment  Diabetics should hold regular insulin (if taken separately) and take 1/2 normal NPH dos the morning of the procedure.  Carry some sugar containing items with you to your appointment. 4. A driver must accompany you and be prepared to drive you home after your procedure.  5. Bring all your current medications with your. 6. An IV may be inserted and sedation may be given at the discretion of the physician.   7. A blood pressure cuff, EKG and other monitors will often be applied during the procedure.  Some patients may need to have extra oxygen administered for a short period. 8. You will be asked to provide medical information, including your allergies, prior to the procedure.  We must know immediately if you are taking blood thinners (like Coumadin/Warfarin)  Or if you are allergic to IV iodine contrast (dye). We must know if you could possible be pregnant.  Possible side-effects:  Bleeding from needle site  Infection (rare, may require surgery)  Nerve injury (rare)  Numbness & tingling (temporary)  Difficulty urinating (rare, temporary)  Spinal headache ( a headache worse with upright posture)  Light -headedness (temporary)  Pain at injection site (several days)  Decreased blood pressure (temporary)  Weakness in arm/leg (temporary)  Pressure sensation in back/neck (temporary)  Call if you experience:  Fever/chills associated with headache or increased back/neck pain.  Headache worsened by an upright position.  New onset weakness or numbness of an extremity below the injection site  Hives or difficulty breathing (go to the emergency  room)  Inflammation or drainage at the  infection site  Severe back/neck pain  Any new symptoms which are concerning to you  Please note:  Although the local anesthetic injected can often make your back or neck feel good for several hours after the injection, the pain will likely return.  It takes 3-7 days for steroids to work in the epidural space.  You may not notice any pain relief for at least that one week.  If effective, we will often do a series of three injections spaced 3-6 weeks apart to maximally decrease your pain.  After the initial series, we generally will wait several months before considering a repeat injection of the same type.  If you have any questions, please call [phone] [facility]

## 2018-02-14 NOTE — Progress Notes (Signed)
Safety precautions to be maintained throughout the outpatient stay will include: orient to surroundings, keep bed in low position, maintain call bell within reach at all times, provide assistance with transfer out of bed and ambulation.  

## 2018-02-18 ENCOUNTER — Ambulatory Visit (INDEPENDENT_AMBULATORY_CARE_PROVIDER_SITE_OTHER): Payer: Medicaid Other | Admitting: Nurse Practitioner

## 2018-02-18 ENCOUNTER — Encounter (INDEPENDENT_AMBULATORY_CARE_PROVIDER_SITE_OTHER): Payer: Self-pay | Admitting: Nurse Practitioner

## 2018-02-18 VITALS — BP 155/91 | HR 92 | Resp 16 | Ht 69.0 in | Wt 325.8 lb

## 2018-02-18 DIAGNOSIS — L97211 Non-pressure chronic ulcer of right calf limited to breakdown of skin: Secondary | ICD-10-CM

## 2018-02-18 NOTE — Progress Notes (Signed)
History of Present Illness  There is no documented history at this time  Assessments & Plan   There are no diagnoses linked to this encounter.    Additional instructions  Subjective:  Patient presents with venous ulcer of the Right lower extremity.    Procedure:  3 layer unna wrap was placed Right lower extremity.   Plan:   Follow up in one week.   

## 2018-02-25 ENCOUNTER — Ambulatory Visit (INDEPENDENT_AMBULATORY_CARE_PROVIDER_SITE_OTHER): Payer: Medicaid Other | Admitting: Nurse Practitioner

## 2018-02-25 ENCOUNTER — Encounter (INDEPENDENT_AMBULATORY_CARE_PROVIDER_SITE_OTHER): Payer: Self-pay | Admitting: Nurse Practitioner

## 2018-02-25 ENCOUNTER — Other Ambulatory Visit: Payer: Self-pay

## 2018-02-25 VITALS — BP 122/88 | HR 102 | Resp 16 | Ht 69.0 in | Wt 320.0 lb

## 2018-02-25 DIAGNOSIS — M5136 Other intervertebral disc degeneration, lumbar region: Secondary | ICD-10-CM | POA: Diagnosis not present

## 2018-02-25 DIAGNOSIS — I890 Lymphedema, not elsewhere classified: Secondary | ICD-10-CM

## 2018-02-25 DIAGNOSIS — E785 Hyperlipidemia, unspecified: Secondary | ICD-10-CM

## 2018-02-25 DIAGNOSIS — M51369 Other intervertebral disc degeneration, lumbar region without mention of lumbar back pain or lower extremity pain: Secondary | ICD-10-CM

## 2018-03-01 ENCOUNTER — Ambulatory Visit: Payer: Medicaid Other | Admitting: Pain Medicine

## 2018-03-01 NOTE — Progress Notes (Deleted)
Patient's Name: [name]  MRN: [id number]  Referring Provider: Theotis Burrow*  DOB: April 01, 1965  PCP: Theotis Burrow, MD  DOS: 03/01/2018  Note by: Gaspar Cola, MD  Service setting: Ambulatory outpatient  Specialty: Interventional Pain Management  Patient type: Established  Location: [facility] (AMB) Pain Management Facility  Visit type: Interventional Procedure   Primary Reason for Visit: Interventional Pain Management Treatment. CC: No chief complaint on file.  Procedure #1:  Anesthesia, Analgesia, Anxiolysis:  Type: Therapeutic Trans-Foraminal Epidural Steroid Injection   #2  Region: Lumbar Level: L4 Paravertebral Laterality: Bilateral Paravertebral   Type: Moderate (Conscious) Sedation combined with Local Anesthesia Indication(s): Analgesia and Anxiety Route: Intravenous (IV) IV Access: Secured Sedation: Meaningful verbal contact was maintained at all times during the procedure  Local Anesthetic: Lidocaine 1-2%  Position: Prone  Procedure #2:    Type: Therapeutic Inter-Laminar Epidural Steroid Injection   #2  Region: Lumbar Level: L3-4 Level. Laterality: Midline             Indications: 1. DDD (degenerative disc disease), lumbar   2. Lumbar central spinal stenosis (L4-5)   3. 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 Lumbar spondylosis   5. 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 Chronic lower extremity pain (Secondary Area of Pain) (Bilateral) (R>L)    Pain Score: Pre-procedure:  /10 Post-procedure:  /10  Pre-op Assessment:  Mr. Kattner is a 53 y.o. (year old), male patient, seen today for interventional treatment. He  has a past surgical history that includes Esophagogastroduodenoscopy (egd) with propofol (N/A, 11/09/2016); Colonoscopy with propofol (N/A, 11/09/2016); Colonoscopy with propofol (N/A, 07/06/2017); and Esophagogastroduodenoscopy (egd) with propofol (N/A,  07/06/2017). Mr. Coryell has a current medication list which includes the following prescription(s): albuterol, atorvastatin, bupropion, gnp calcium 1200, vitamin d3, fluticasone-salmeterol, furosemide, hydrochlorothiazide, loratadine, losartan, magnesium, meloxicam, metformin, oxybutynin, pantoprazole, and tizanidine. His primarily concern today is the No chief complaint on file.  Initial Vital Signs:  Pulse/HCG Rate:    Temp:   Resp:   BP:   SpO2:    BMI: Estimated body mass index is 47.26 kg/m as calculated from the following:   Height as of 02/25/18: 5\' 9"  (1.753 m).   Weight as of 02/25/18: 320 lb (145.2 kg).  Risk Assessment: Allergies: Reviewed. He is allergic to shellfish allergy; codeine; and lisinopril.  Allergy Precautions: None required Coagulopathies: Reviewed. None identified.  Blood-thinner therapy: None at this time Active Infection(s): Reviewed. None identified. Mr. Ngo is afebrile  Site Confirmation: Mr. Gladney was asked to confirm the procedure and laterality before marking the site Procedure checklist: Completed Consent: Before the procedure and under the influence of no sedative(s), amnesic(s), or anxiolytics, the patient was informed of the treatment options, risks and possible complications. To fulfill our ethical and legal obligations, as recommended by the American Medical Association's Code of Ethics, I have informed the patient of my clinical impression; the nature and purpose of the treatment or procedure; the risks, benefits, and possible complications of the intervention; the alternatives, including doing nothing; the risk(s) and benefit(s) of the alternative treatment(s) or procedure(s); and the risk(s) and benefit(s) of doing nothing. The patient was provided information about the general risks and possible complications associated with the procedure. These may include, but are not limited to: failure to achieve desired goals, infection, bleeding, organ or nerve  damage, allergic reactions, paralysis, and death. In addition, the patient was informed of those risks and  complications associated to Spine-related procedures, such as failure to decrease pain; infection (i.e.: Meningitis, epidural or intraspinal abscess); bleeding (i.e.: epidural hematoma, subarachnoid hemorrhage, or any other type of intraspinal or peri-dural bleeding); organ or nerve damage (i.e.: Any type of peripheral nerve, nerve root, or spinal cord injury) with subsequent damage to sensory, motor, and/or autonomic systems, resulting in permanent pain, numbness, and/or weakness of one or several areas of the body; allergic reactions; (i.e.: anaphylactic reaction); and/or death. Furthermore, the patient was informed of those risks and complications associated with the medications. These include, but are not limited to: allergic reactions (i.e.: anaphylactic or anaphylactoid reaction(s)); adrenal axis suppression; blood sugar elevation that in diabetics may result in ketoacidosis or comma; water retention that in patients with history of congestive heart failure may result in shortness of breath, pulmonary edema, and decompensation with resultant heart failure; weight gain; swelling or edema; medication-induced neural toxicity; particulate matter embolism and blood vessel occlusion with resultant organ, and/or nervous system infarction; and/or aseptic necrosis of one or more joints. Finally, the patient was informed that Medicine is not an exact science; therefore, there is also the possibility of unforeseen or unpredictable risks and/or possible complications that may result in a catastrophic outcome. The patient indicated having understood very clearly. We have given the patient no guarantees and we have made no promises. Enough time was given to the patient to ask questions, all of which were answered to the patient's satisfaction. Mr. Hilgers has indicated that he wanted to continue with the  procedure. Attestation: I, the ordering provider, attest that I have discussed with the patient the benefits, risks, side-effects, alternatives, likelihood of achieving goals, and potential problems during recovery for the procedure that I have provided informed consent. Date  Time: {CHL [facility]-PAIN TIME CHOICES:[id number]}  Pre-Procedure Preparation:  Monitoring: As per clinic protocol. Respiration, ETCO2, SpO2, BP, heart rate and rhythm monitor placed and checked for adequate function Safety Precautions: Patient was assessed for positional comfort and pressure points before starting the procedure. Time-out: I initiated and conducted the "Time-out" before starting the procedure, as per protocol. The patient was asked to participate by confirming the accuracy of the "Time Out" information. Verification of the correct person, site, and procedure were performed and confirmed by me, the nursing staff, and the patient. "Time-out" conducted as per Joint Commission's Universal Protocol (UP.01.01.01). Time:    Description of Procedure #1:  Target Area: The inferior and lateral portion of the pedicle, just lateral to a line created by the 6:00 position of the pedicle and the superior articular process of the vertebral body below. On the lateral view, this target lies just posterior to the anterior aspect of the lamina and posterior to the midpoint created between the anterior and the posterior aspect of the neural foramina. Approach: Posterior paravertebral approach. Area Prepped: Entire Posterior Lumbosacral Region Prepping solution: ChloraPrep (2% chlorhexidine gluconate and 70% isopropyl alcohol) Safety Precautions: Aspiration looking for blood return was conducted prior to all injections. At no point did we inject any substances, as a needle was being advanced. No attempts were made at seeking any paresthesias. Safe injection practices and needle disposal techniques used. Medications properly checked for  expiration dates. SDV (single dose vial) medications used.  Description of the Procedure: Protocol guidelines were followed. The patient was placed in position over the fluoroscopy table. The target area was identified and the area prepped in the usual manner. Skin & deeper tissues infiltrated with local anesthetic. Appropriate amount of time allowed to  pass for local anesthetics to take effect. The procedure needles were then advanced to the target area. Proper needle placement secured. Negative aspiration confirmed. Solution injected in intermittent fashion, asking for systemic symptoms every 0.2cc of injectate. The needles were then removed and the area cleansed, making sure to leave some of the prepping solution back to take advantage of its long term bactericidal properties.  Start Time:   hrs.  Materials:  Needle(s) Type: Spinal Needle Gauge: 22G Length: 3.5-in Medication(s): Please see orders for medications and dosing details.  Description of Procedure #2:  Target Area: The  interlaminar space, initially targeting the lower border of the superior vertebral body lamina. Approach: Posterior paramedial approach. Area Prepped: Same as above Prepping solution: Same as above Safety Precautions: Same as above  Description of the Procedure: Protocol guidelines were followed. The patient was placed in position over the fluoroscopy table. The target area was identified and the area prepped in the usual manner. Skin & deeper tissues infiltrated with local anesthetic. Appropriate amount of time allowed to pass for local anesthetics to take effect. The procedure needle was introduced through the skin, ipsilateral to the reported pain, and advanced to the target area. Bone was contacted and the needle walked caudad, until the lamina was cleared. The ligamentum flavum was engaged and loss-of-resistance technique used as the epidural needle was advanced. The epidural space was identified using  "loss-of-resistance technique" with 2-3 ml of PF-NaCl (0.9% NSS), in a 5cc LOR glass syringe. Proper needle placement secured. Negative aspiration confirmed. Solution injected in intermittent fashion, asking for systemic symptoms every 0.5cc of injectate. The needles were then removed and the area cleansed, making sure to leave some of the prepping solution back to take advantage of its long term bactericidal properties.  There were no vitals filed for this visit.  End Time:   hrs.  Materials:  Needle(s) Type: Epidural needle Gauge: 17G Length: 3.5-in Medication(s): Please see orders for medications and dosing details.  Imaging Guidance (Spinal):          Type of Imaging Technique: Fluoroscopy Guidance (Spinal) Indication(s): Assistance in needle guidance and placement for procedures requiring needle placement in or near specific anatomical locations not easily accessible without such assistance. Exposure Time: Please see nurses notes. Contrast: Before injecting any contrast, we confirmed that the patient did not have an allergy to iodine, shellfish, or radiological contrast. Once satisfactory needle placement was completed at the desired level, radiological contrast was injected. Contrast injected under live fluoroscopy. No contrast complications. See chart for type and volume of contrast used. Fluoroscopic Guidance: I was personally present during the use of fluoroscopy. "Tunnel Vision Technique" used to obtain the best possible view of the target area. Parallax error corrected before commencing the procedure. "Direction-depth-direction" technique used to introduce the needle under continuous pulsed fluoroscopy. Once target was reached, antero-posterior, oblique, and lateral fluoroscopic projection used confirm needle placement in all planes. Images permanently stored in EMR. Interpretation: I personally interpreted the imaging intraoperatively. Adequate needle placement confirmed in multiple  planes. Appropriate spread of contrast into desired area was observed. No evidence of afferent or efferent intravascular uptake. No intrathecal or subarachnoid spread observed. Permanent images saved into the patient's record.  Antibiotic Prophylaxis:   Anti-infectives (From admission, onward)   None     Indication(s): None identified  Post-operative Assessment:  Post-procedure Vital Signs:  Pulse/HCG Rate:    Temp:   Resp:   BP:   SpO2:    EBL: None  Complications: No  immediate post-treatment complications observed by team, or reported by patient.  Note: The patient tolerated the entire procedure well. A repeat set of vitals were taken after the procedure and the patient was kept under observation following institutional policy, for this type of procedure. Post-procedural neurological assessment was performed, showing return to baseline, prior to discharge. The patient was provided with post-procedure discharge instructions, including a section on how to identify potential problems. Should any problems arise concerning this procedure, the patient was given instructions to immediately contact us, at any time, without hesitation. In any case, we plan to contact the patient by telephone for a follow-up status report regarding this interventional procedure.  Comments:  No additional relevant information.  Plan of Care   Imaging Orders  No imaging studies ordered today   Procedure Orders    No procedure(s) ordered today    Medications ordered for procedure: No orders of the defined types were placed in this encounter.  Medications administered: [name] had no medications administered during this visit.  See the medical record for exact dosing, route, and time of administration.  Disposition: Discharge home  Discharge Date & Time: 03/01/2018;   hrs.   Physician-requested Follow-up: No follow-ups on file.  Future Appointments  Date Time Provider [place]   03/01/2018 12:45 PM Milinda Pointer, MD [facility] None  03/21/2018  1:30 PM Gloriajean Dell, RD [facility] None  05/03/2018 10:45 AM Dew, Erskine Squibb, MD [facility] None   Primary Care Physician: Theotis Burrow, MD Location: [facility] Out[facility] Note by: Gaspar Cola, MD Date: 03/01/2018; Time: 7:28 AM  Disclaimer:  Medicine is not an Chief Strategy Officer. The only guarantee in medicine is that nothing is guaranteed. It is important to note that the decision to proceed with this intervention was based on the information collected from the patient. The Data and conclusions were drawn from the patient's questionnaire, the interview, and the physical examination. Because the information was provided in large part by the patient, it cannot be guaranteed that it has not been purposely or unconsciously manipulated. Every effort has been made to obtain as much relevant data as possible for this evaluation. It is important to note that the conclusions that lead to this procedure are derived in large part from the available data. Always take into account that the treatment will also be dependent on availability of resources and existing treatment guidelines, considered by other Pain Management Practitioners as being common knowledge and practice, at the time of the intervention. For Medico-Legal purposes, it is also important to point out that variation in procedural techniques and pharmacological choices are the acceptable norm. The indications, contraindications, technique, and results of the above procedure should only be interpreted and judged by a Board-Certified Interventional Pain Specialist with extensive familiarity and expertise in the same exact procedure and technique.

## 2018-03-08 ENCOUNTER — Encounter (INDEPENDENT_AMBULATORY_CARE_PROVIDER_SITE_OTHER): Payer: Self-pay | Admitting: Nurse Practitioner

## 2018-03-08 NOTE — Progress Notes (Signed)
SUBJECTIVE:  Patient ID: [name], male    DOB: [birth year], 53 y.o.   MRN: [id number] Chief Complaint  Patient presents with  . Follow-up    HPI  [name] is a 53 y.o. male The patient returns to the office for followup evaluation regarding leg swelling.  The swelling has improved quite a bit and the pain associated with swelling has decreased substantially. There have not been any interval development of a ulcerations or wounds.  Since the previous visit the patient has been wearing graduated compression stockings and has noted little significant improvement in the lymphedema. The patient has been using compression routinely morning until night.  The patient also states elevation during the day and exercise is being done too.     Past Medical History:  Diagnosis Date  . COPD (chronic obstructive pulmonary disease) ([place])   . Coronary artery disease   . Diabetes mellitus without complication ([place])   . Hypertension   . Peptic ulcer   . Sleep apnea     Past Surgical History:  Procedure Laterality Date  . COLONOSCOPY WITH PROPOFOL N/A 11/09/2016   Procedure: COLONOSCOPY WITH PROPOFOL;  Surgeon: Lollie Sails, MD;  Location: [facility] ENDOSCOPY;  Service: Endoscopy;  Laterality: N/A;  . COLONOSCOPY WITH PROPOFOL N/A 07/06/2017   Procedure: COLONOSCOPY WITH PROPOFOL;  Surgeon: Lollie Sails, MD;  Location: [facility] ENDOSCOPY;  Service: Endoscopy;  Laterality: N/A;  . ESOPHAGOGASTRODUODENOSCOPY (EGD) WITH PROPOFOL N/A 11/09/2016   Procedure: ESOPHAGOGASTRODUODENOSCOPY (EGD) WITH PROPOFOL;  Surgeon: Lollie Sails, MD;  Location: [facility] ENDOSCOPY;  Service: Endoscopy;  Laterality: N/A;  . ESOPHAGOGASTRODUODENOSCOPY (EGD) WITH PROPOFOL N/A 07/06/2017   Procedure: ESOPHAGOGASTRODUODENOSCOPY (EGD) WITH PROPOFOL;  Surgeon: Lollie Sails, MD;  Location: [facility] ENDOSCOPY;  Service: Endoscopy;  Laterality: N/A;    Social History   Socioeconomic History  . Marital  status: Single    Spouse name: Not on file  . Number of children: Not on file  . Years of education: Not on file  . Highest education level: Not on file  Occupational History  . Not on file  Social Needs  . Financial resource strain: Not on file  . Food insecurity:    Worry: Not on file    Inability: Not on file  . Transportation needs:    Medical: Not on file    Non-medical: Not on file  Tobacco Use  . Smoking status: Current Every Day Smoker    Packs/day: 1.00    Years: 30.00    Pack years: 30.00    Types: Cigarettes  . Smokeless tobacco: Never Used  Substance and Sexual Activity  . Alcohol use: No  . Drug use: No  . Sexual activity: Never  Lifestyle  . Physical activity:    Days per week: Not on file    Minutes per session: Not on file  . Stress: Not on file  Relationships  . Social connections:    Talks on phone: Not on file    Gets together: Not on file    Attends religious service: Not on file    Active member of club or organization: Not on file    Attends meetings of clubs or organizations: Not on file    Relationship status: Not on file  . Intimate partner violence:    Fear of current or ex partner: Not on file    Emotionally abused: Not on file    Physically abused: Not on file    Forced sexual activity:  Not on file  Other Topics Concern  . Not on file  Social History Narrative  . Not on file    Family History  Problem Relation Age of Onset  . Diabetes Mother   . Diabetes Father   . Stomach cancer Father     Allergies  Allergen Reactions  . Shellfish Allergy Swelling  . Codeine Rash  . Lisinopril Cough     Review of Systems   Review of Systems: Negative Unless Checked Constitutional: [] Weight loss  [] Fever  [] Chills Cardiac: [] Chest pain   []  Atrial Fibrillation  [] Palpitations   [] Shortness of breath when laying flat   [] Shortness of breath with exertion. [] Shortness of breath at rest Vascular:  [] Pain in legs with walking   [] Pain in  legs with standing [] Pain in legs when laying flat   [] Claudication    [] Pain in feet when laying flat    [] History of DVT   [] Phlebitis   [] Swelling in legs   [] Varicose veins   [] Non-healing ulcers Pulmonary:   [] Uses home oxygen   [] Productive cough   [] Hemoptysis   [] Wheeze  [] COPD   [] Asthma Neurologic:  [] Dizziness   [] Seizures  [] Blackouts [] History of stroke   [] History of TIA  [] Aphasia   [] Temporary Blindness   [] Weakness or numbness in arm   [] Weakness or numbness in leg Musculoskeletal:   [] Joint swelling   [] Joint pain   [] Low back pain  []  History of Knee Replacement [] Arthritis [] back Surgeries  []  Spinal Stenosis    Hematologic:  [] Easy bruising  [] Easy bleeding   [] Hypercoagulable state   [] Anemic Gastrointestinal:  [] Diarrhea   [] Vomiting  [] Gastroesophageal reflux/heartburn   [] Difficulty swallowing. [] Abdominal pain Genitourinary:  [] Chronic kidney disease   [] Difficult urination  [] Anuric   [] Blood in urine [] Frequent urination  [] Burning with urination   [] Hematuria Skin:  [] Rashes   [] Ulcers [] Wounds Psychological:  [] History of anxiety   []  History of major depression  []  Memory Difficulties      OBJECTIVE:   Physical Exam  BP 122/88 (BP Location: Left Arm, Patient Position: Sitting, Cuff Size: Large)   Pulse (!) 102   Resp 16   Ht 5\' 9"  (1.753 m)   Wt (!) 320 lb (145.2 kg)   BMI 47.26 kg/m   Gen: WD/WN, NAD Head: [place]/AT, No temporalis wasting.  Ear/Nose/Throat: Hearing grossly intact, nares w/o erythema or drainage Eyes: PER, EOMI, sclera nonicteric.  Neck: Supple, no masses.  No JVD.  Pulmonary:  Good air movement, no use of accessory muscles.  Cardiac: RRR Vascular:  Vessel Right Left  Radial Palpable Palpable   Gastrointestinal: soft, non-distended. No guarding/no peritoneal signs.  Musculoskeletal: M/S 5/5 throughout.  No deformity or atrophy.  Neurologic: Pain and light touch intact in extremities.  Symmetrical.  Speech is fluent. Motor exam as listed  above. Psychiatric: Judgment intact, Mood & affect appropriate for pt's clinical situation. Dermatologic: No Venous rashes. No Ulcers Noted.  No changes consistent with cellulitis. Lymph : No Cervical lymphadenopathy, no lichenification or skin changes of chronic lymphedema.       ASSESSMENT AND PLAN:  1. Lymphedema Previous ulcerations have healed.  We will transition the patient from [place] wraps to medical grade 1 compression therapy.  I have reviewed my discussion with the patient regarding lymphedema and why it  causes symptoms.  Patient will continue wearing graduated compression stockings class 1 (20-30 mmHg) on a daily basis a prescription was given. The patient is reminded to put the stockings  on first thing in the morning and removing them in the evening. The patient is instructed specifically not to sleep in the stockings.   In addition, behavioral modification throughout the day will be continued.  This will include frequent elevation (such as in a recliner), use of over the counter pain medications as needed and exercise such as walking.  Patient will follow-up in 2 months in order to assess edema status.  2. Hyperlipidemia, unspecified hyperlipidemia type Continue statin as ordered and reviewed, no changes at this time   3. DDD (degenerative disc disease), lumbar Continue NSAID medications as already ordered, these medications have been reviewed and there are no changes at this time.  Continued activity and therapy was stressed.    Current Outpatient Medications on File Prior to Visit  Medication Sig Dispense Refill  . albuterol (VENTOLIN HFA) 108 (90 Base) MCG/ACT inhaler Inhale 2 puffs into the lungs every 6 (six) hours as needed for wheezing or shortness of breath.    Marland Kitchen buPROPion (WELLBUTRIN SR) 150 MG 12 hr tablet Take 150 mg by mouth 2 (two) times daily.    Marland Kitchen atorvastatin (LIPITOR) 20 MG tablet Take 20 mg by mouth at bedtime.     . Calcium Carbonate-Vit D-Min (GNP  CALCIUM 1200) 1200-1000 MG-UNIT CHEW Chew 1,200 mg by mouth daily with breakfast. Take in combination with vitamin D and magnesium. 30 tablet 5  . Cholecalciferol (VITAMIN D3) 5000 units CAPS Take 1 capsule (5,000 Units total) by mouth daily with breakfast. Take along with calcium and magnesium. 30 capsule 5  . Fluticasone-Salmeterol (ADVAIR DISKUS) 100-50 MCG/DOSE AEPB Inhale 1 puff into the lungs 2 (two) times daily.     . furosemide (LASIX) 40 MG tablet Take 1 tablet (40 mg total) by mouth daily. 30 tablet 0  . hydrochlorothiazide (MICROZIDE) 12.5 MG capsule Take 12.5 mg by mouth daily.   0  . loratadine (CLARITIN) 10 MG tablet Take 10 mg by mouth daily.    Marland Kitchen losartan (COZAAR) 50 MG tablet Take 1 tablet (50 mg total) by mouth daily. 30 tablet 0  . Magnesium 500 MG CAPS Take 1 capsule (500 mg total) by mouth 2 (two) times daily at 8 am and 10 pm. 60 capsule 5  . meloxicam (MOBIC) 15 MG tablet Take 1 tablet (15 mg total) by mouth daily for 30 days. 30 tablet 0  . metFORMIN (GLUCOPHAGE) 1000 MG tablet Take 1,000 mg by mouth 2 (two) times daily with a meal.    . oxybutynin (DITROPAN) 5 MG tablet Take 5 mg by mouth every 12 (twelve) hours.    . pantoprazole (PROTONIX) 40 MG tablet Take 40 mg by mouth 2 (two) times daily.     Marland Kitchen tiZANidine (ZANAFLEX) 4 MG tablet Take 1 tablet (4 mg total) by mouth every 8 (eight) hours as needed for up to 30 days for muscle spasms. 90 tablet 0   No current facility-administered medications on file prior to visit.     There are no Patient Instructions on file for this visit. No follow-ups on file.   Kris Hartmann, NP  This note was completed with Sales executive.  Any errors are purely unintentional.

## 2018-03-21 ENCOUNTER — Encounter: Payer: Medicaid Other | Attending: Family Medicine | Admitting: Dietician

## 2018-03-21 ENCOUNTER — Encounter: Payer: Self-pay | Admitting: Dietician

## 2018-03-21 DIAGNOSIS — M5442 Lumbago with sciatica, left side: Secondary | ICD-10-CM | POA: Diagnosis not present

## 2018-03-21 DIAGNOSIS — Z713 Dietary counseling and surveillance: Secondary | ICD-10-CM | POA: Diagnosis not present

## 2018-03-21 DIAGNOSIS — E119 Type 2 diabetes mellitus without complications: Secondary | ICD-10-CM | POA: Diagnosis not present

## 2018-03-21 DIAGNOSIS — G8929 Other chronic pain: Secondary | ICD-10-CM | POA: Diagnosis not present

## 2018-03-21 DIAGNOSIS — Z6841 Body Mass Index (BMI) 40.0 and over, adult: Secondary | ICD-10-CM | POA: Diagnosis not present

## 2018-03-21 DIAGNOSIS — M5441 Lumbago with sciatica, right side: Secondary | ICD-10-CM | POA: Insufficient documentation

## 2018-03-21 DIAGNOSIS — M47816 Spondylosis without myelopathy or radiculopathy, lumbar region: Secondary | ICD-10-CM | POA: Insufficient documentation

## 2018-03-21 DIAGNOSIS — I10 Essential (primary) hypertension: Secondary | ICD-10-CM | POA: Insufficient documentation

## 2018-03-21 NOTE — Patient Instructions (Addendum)
Establish a meal pattern of 3 meals/day spaced 4-5 hours apart. Balance meals with 1-3 oz protein, 2 starch servings and non-starchy vegetables. Refer to food guide plate and "Planning a Balanced meal". Can add a small serving of fruit with meals (Refer to portions).  Switch from soda to water with lemon added. Try making tea with Splenda and adding Splenda to coffee rather than sugar. Continue to limit salt added with goal of no salt added in cooking or at the table. Use frozen vegetables or fresh vegetables in place of canned vegetables as much as possible.

## 2018-03-21 NOTE — Progress Notes (Signed)
Medical Nutrition Therapy:  Visit start time: 1330 end time: 1430 Assessment:  Diagnosis:morbid obesity, osteoarthritis, back pain  Past medical history: hypertension, high cholesterol, diabetes, Barrett's esophagus Psychosocial issues/ stress concerns:  Patient rates his stress as "high" and states he copes by smoking. His PHQ-9 depression score was 21 indicating severe depression. He states he presently does not have thoughts of suicide or hurting himself but has attempted suicide in the past (10/'19) after his nephew was killed.  He states that he meets with a psychiatrist when he sees his MD and will see her next week. When asked if he needed to see someone sooner than next week, he said "no". Preferred learning method:  . Auditory . Visual . Hands-on  Current weight: 333 lbs Height: 69 in Medications, supplements: see list  Progress and evaluation:  Patient in for initial medical nutrition therapy appointment. Today's weight is 13 lbs greater than his weight 02/25/18 [facility] where he was seen for  swollen feet and leg. He states that his MD has told him that his blood sugars are "good". Re meals, he typically skips breakfast and drinks 4 large cups of coffee with 4 tsp of sugar each. He reports that he usually cooks meat for lunch such as hamburger steak or boiled chicken and drinks 16 oz Pepsi. Once weekly, he cooks vegetables at lunch or eats a salad with vegetables only; no dressing. His dinner meal may consist of a meat or chicken/dumplings or a hamburger or hot dog with fries, 16 oz Pepsi. He doesn't typically snack after dinner but may occasionally eat a small pack of M &M's. Rarely eats "out". His diet is low in fruits and vegetables; states he doesn't have enough money to purchase but does get some vegetables from [facility]. He does eat salads; buys several heads of lettuce, cucumbers and tomatoes when he receives food stamps and states will  last for 2+ weeks. He adds salt in cooking but rarely at the table.  Physical activity:none due to back pain   Nutrition Care Education: Weight control:  Used food guide plate to instruct on a meal plan to promote weight loss that better balances protein, carbohydrate and non-starchy vegetables. Discussed spreading out 3 oz portions of meat at lunch and dinner verses larger portions so can spend more on vegetables and fruits. Also, discussed savings if eliminated sodas. Encouraged to use peanut butter and dried beans as protein sources since these foods are often available at area food pantries.  Used food guide plate and food  models to teach portions and more balance. Gave and reviewed a simple meal plan guide as a tool as well as simple quick meal ideas. Discussed how meal plan can help promote weight loss as well as help with glucose control.   Nutritional Diagnosis:  NI-5.11.1 Predicted suboptimal nutrient intake As related to low intake of fruits, vegetables, fiber.  As evidenced by diet history..  Intervention:  Establish a meal pattern of 3 meals/day spaced 4-5 hours apart. Balance meals with 1-3 oz protein, 2 starch servings and non-starchy vegetables. Refer to food guide plate and "Planning a Balanced meal". Can add a small serving of fruit with meals (Refer to portions).  Switch from soda to water with lemon added. Try making tea with Splenda and adding Splenda to coffee rather than sugar. Continue to limit salt added with goal of no salt added in cooking or at the table. Use frozen vegetables or fresh vegetables  in place of canned vegetables as much as possible.   Education Materials given:  . Plate Planner . Food lists/ Planning A Balanced Meal . Quick and Healthy Meals . Simple meal planning . Goals/ instructions  Learner/ who was taught:  . Patient   Level of understanding: . Partial understanding; needs review/ practice  Demonstrated degree of understanding  via:   Teach back Learning barriers: . None  Willingness to learn/ readiness for change: . Acceptance, ready for change  Monitoring and Evaluation:  Dietary intake, exercise,  and body weight      follow up: April 18, 2018 at 1:30pm

## 2018-04-11 ENCOUNTER — Other Ambulatory Visit: Payer: Self-pay

## 2018-04-11 ENCOUNTER — Ambulatory Visit: Payer: Medicaid Other | Attending: Nurse Practitioner | Admitting: Nurse Practitioner

## 2018-04-11 ENCOUNTER — Other Ambulatory Visit: Payer: Self-pay | Admitting: Pain Medicine

## 2018-04-11 DIAGNOSIS — M150 Primary generalized (osteo)arthritis: Secondary | ICD-10-CM

## 2018-04-11 DIAGNOSIS — M79604 Pain in right leg: Secondary | ICD-10-CM

## 2018-04-11 DIAGNOSIS — M159 Polyosteoarthritis, unspecified: Secondary | ICD-10-CM

## 2018-04-11 DIAGNOSIS — M47816 Spondylosis without myelopathy or radiculopathy, lumbar region: Secondary | ICD-10-CM | POA: Diagnosis not present

## 2018-04-11 DIAGNOSIS — M792 Neuralgia and neuritis, unspecified: Secondary | ICD-10-CM

## 2018-04-11 DIAGNOSIS — G8929 Other chronic pain: Secondary | ICD-10-CM

## 2018-04-11 DIAGNOSIS — M79605 Pain in left leg: Secondary | ICD-10-CM

## 2018-04-11 DIAGNOSIS — M7918 Myalgia, other site: Principal | ICD-10-CM

## 2018-04-11 MED ORDER — MELOXICAM 15 MG PO TABS: 15.0000 mg | ORAL_TABLET | Freq: Every day | ORAL | 2 refills | Status: DC

## 2018-04-11 MED ORDER — TIZANIDINE HCL 4 MG PO TABS: 4.0000 mg | ORAL_TABLET | Freq: Three times a day (TID) | ORAL | 2 refills | Status: DC | PRN

## 2018-04-11 NOTE — Progress Notes (Signed)
Virtual Visit via Telephone Note  I connected with [name] on 04/11/18 at 11:30 AM EDT by telephone and verified that I am speaking with the correct person using two identifiers.   I discussed the limitations, risks, security and privacy concerns of performing an evaluation and management service by telephone and the availability of in person appointments. I also discussed with the patient that there may be a patient responsible charge related to this service. The patient expressed understanding and agreed to proceed.   History of Present Illness: HPI  Reason for Visit: [name] is a 53 y.o. year old, male patient, whose chief complaint is low back and leg pain.   Last Appointment: His last appointment at our practice was on 02/14/2018.   Pain Assessment: Today, [name] describes the severity a 7/10. He admits that the pain goes down his legs in to his feet. He has numbness and tingling. He does have some weakness. He denies any changes in his pain. Some days are worse than others. He admits that he sitting and laying down helps to ease the pain. He admits that the interventional therapy ws temporary, it only lasted a few days. He does have some drowsiness and takes a nap.       Observations/Objective: Physical Exam  Mental status: Alert, oriented x 3 (person, place, & time)       Respiratory: No evidence of acute respiratory distress Vitals: There were no vitals taken for this visit. BMI: Estimated body mass index is 49.22 kg/m as calculated from the following:   Height as of 03/21/18: 5\' 9"  (1.753 m).   Weight as of 03/21/18: 333 lb 4.8 oz (151.2 kg). Ideal: Patient weight not recorded    Assessment and Plan: Assessment   Status Diagnosis  Persistent Persistent Persistent 1. Osteoarthritis of facet joint of lumbar spine   2. Chronic lower extremity pain (Secondary Area of Pain) (Bilateral) (R>L)   3. Chronic musculoskeletal pain   4. Neurogenic pain   5.  Osteoarthritis involving multiple joints      Updated Problems: No problems updated.   Follow Up Instructions: Interventional management options: Planned, scheduled, and/or pending:   NOTE:NO RFAuntil BMI is <35 Diagnostic bilateral L4 transforaminal LESI #2 + Midline L3-4 interlaminar LESI #2 under fluoroscopic guidance and IV sedation. Today I had a long conversation with the patient regarding his weight.  We will go ahead and arrange for referral to a medically supervised weight management program.  I am also going to get him a referral to bariatric surgery.  His current BMI of 45.93 and we need to work in bringing this down to less than 30.  However, once he brings it down to at least 35, then we may be able to do the radiofrequency ablation.   Considering:   Diagnostic bilateral L4 transforaminal LESI#1 Diagnostic (Midline) L3-4 LESI #1 Diagnostic bilateral lumbar facet nerve block#3 Possible bilateral lumbar facetRFA   Palliative PRN treatment(s):   Diagnostic bilateral lumbar facet block#3under fluoroscopic guidance and IV sedation       I discussed the assessment and treatment plan with the patient. The patient was provided an opportunity to ask questions and all were answered. The patient agreed with the plan and demonstrated an understanding of the instructions.   The patient was advised to call back or seek an in-person evaluation if the symptoms worsen or if the condition fails to improve as anticipated.  I provided 15 minutes of non-face-to-face time during  this encounter.   Dionisio David, NP

## 2018-04-18 ENCOUNTER — Ambulatory Visit: Payer: Medicaid Other | Admitting: Dietician

## 2018-04-19 ENCOUNTER — Encounter: Payer: Self-pay | Admitting: Dietician

## 2018-05-03 ENCOUNTER — Ambulatory Visit (INDEPENDENT_AMBULATORY_CARE_PROVIDER_SITE_OTHER): Payer: Medicaid Other | Admitting: Vascular Surgery

## 2018-05-03 ENCOUNTER — Other Ambulatory Visit: Payer: Self-pay

## 2018-05-03 ENCOUNTER — Encounter (INDEPENDENT_AMBULATORY_CARE_PROVIDER_SITE_OTHER): Payer: Self-pay | Admitting: Vascular Surgery

## 2018-05-03 VITALS — BP 157/81 | HR 96 | Resp 18 | Ht 69.0 in | Wt 321.6 lb

## 2018-05-03 DIAGNOSIS — E119 Type 2 diabetes mellitus without complications: Secondary | ICD-10-CM | POA: Diagnosis not present

## 2018-05-03 DIAGNOSIS — Z6841 Body Mass Index (BMI) 40.0 and over, adult: Secondary | ICD-10-CM

## 2018-05-03 DIAGNOSIS — E785 Hyperlipidemia, unspecified: Secondary | ICD-10-CM

## 2018-05-03 DIAGNOSIS — L03116 Cellulitis of left lower limb: Secondary | ICD-10-CM | POA: Diagnosis not present

## 2018-05-03 DIAGNOSIS — I10 Essential (primary) hypertension: Secondary | ICD-10-CM | POA: Diagnosis not present

## 2018-05-03 DIAGNOSIS — Z79899 Other long term (current) drug therapy: Secondary | ICD-10-CM

## 2018-05-03 DIAGNOSIS — F17210 Nicotine dependence, cigarettes, uncomplicated: Secondary | ICD-10-CM

## 2018-05-03 DIAGNOSIS — I890 Lymphedema, not elsewhere classified: Secondary | ICD-10-CM

## 2018-05-03 DIAGNOSIS — L97221 Non-pressure chronic ulcer of left calf limited to breakdown of skin: Secondary | ICD-10-CM

## 2018-05-03 NOTE — Progress Notes (Signed)
MRN : [id number]  [name] is a 53 y.o. (09/02/1965) male who presents with chief complaint of  Chief Complaint  Patient presents with  . Follow-up    73month follow up  .  History of Present Illness: Patient returns today in follow up of his lower extremity pain, swelling, erythema, and drainage.  The left calf has developed ulcerations on the posterior aspect of the left calf.  Both legs are much more swollen than his last visit a couple of months ago.  There is no clear inciting event or causative factor that caused deterioration of his symptoms.  This has progressed over the past month and both legs are both very red and tender to the touch now.  The left leg is the more severely affected of the 2 legs but both legs are worrisome.  He had no trauma or injury.  He has been using his lymphedema pump and tried to use compression stockings but nothing has made it better.  Current Outpatient Medications  Medication Sig Dispense Refill  . albuterol (VENTOLIN HFA) 108 (90 Base) MCG/ACT inhaler Inhale 2 puffs into the lungs every 6 (six) hours as needed for wheezing or shortness of breath.    Marland Kitchen aspirin-acetaminophen-caffeine (EXCEDRIN MIGRAINE) 250-250-65 MG tablet Take by mouth every 6 (six) hours as needed for headache.    Marland Kitchen atorvastatin (LIPITOR) 20 MG tablet Take 20 mg by mouth at bedtime.     Marland Kitchen buPROPion (WELLBUTRIN SR) 150 MG 12 hr tablet Take 150 mg by mouth 2 (two) times daily.    . Fluticasone-Salmeterol (ADVAIR DISKUS) 100-50 MCG/DOSE AEPB Inhale 1 puff into the lungs 2 (two) times daily.     . furosemide (LASIX) 40 MG tablet Take 1 tablet (40 mg total) by mouth daily. 30 tablet 0  . hydrochlorothiazide (MICROZIDE) 12.5 MG capsule Take 12.5 mg by mouth daily.   0  . loratadine (CLARITIN) 10 MG tablet Take 10 mg by mouth daily.    Marland Kitchen losartan (COZAAR) 50 MG tablet Take 1 tablet (50 mg total) by mouth daily. 30 tablet 0  . meloxicam (MOBIC) 15 MG tablet Take 1 tablet (15 mg total)  by mouth daily. 30 tablet 2  . metFORMIN (GLUCOPHAGE) 1000 MG tablet Take 1,000 mg by mouth 2 (two) times daily with a meal.    . oxybutynin (DITROPAN) 5 MG tablet Take 5 mg by mouth every 12 (twelve) hours.    . pantoprazole (PROTONIX) 40 MG tablet Take 40 mg by mouth 2 (two) times daily.     Marland Kitchen tiZANidine (ZANAFLEX) 4 MG tablet Take 1 tablet (4 mg total) by mouth every 8 (eight) hours as needed for muscle spasms. 90 tablet 2  . Calcium Carbonate-Vit D-Min (GNP CALCIUM 1200) 1200-1000 MG-UNIT CHEW Chew 1,200 mg by mouth daily with breakfast. Take in combination with vitamin D and magnesium. 30 tablet 5   No current facility-administered medications for this visit.     Past Medical History:  Diagnosis Date  . COPD (chronic obstructive pulmonary disease) ([place])   . Coronary artery disease   . Diabetes mellitus without complication ([place])   . Hypertension   . Peptic ulcer   . Sleep apnea     Past Surgical History:  Procedure Laterality Date  . COLONOSCOPY WITH PROPOFOL N/A 11/09/2016   Procedure: COLONOSCOPY WITH PROPOFOL;  Surgeon: Lollie Sails, MD;  Location: [facility] ENDOSCOPY;  Service: Endoscopy;  Laterality: N/A;  . COLONOSCOPY WITH PROPOFOL N/A 07/06/2017   Procedure:  COLONOSCOPY WITH PROPOFOL;  Surgeon: Lollie Sails, MD;  Location: [facility] ENDOSCOPY;  Service: Endoscopy;  Laterality: N/A;  . ESOPHAGOGASTRODUODENOSCOPY (EGD) WITH PROPOFOL N/A 11/09/2016   Procedure: ESOPHAGOGASTRODUODENOSCOPY (EGD) WITH PROPOFOL;  Surgeon: Lollie Sails, MD;  Location: [facility] ENDOSCOPY;  Service: Endoscopy;  Laterality: N/A;  . ESOPHAGOGASTRODUODENOSCOPY (EGD) WITH PROPOFOL N/A 07/06/2017   Procedure: ESOPHAGOGASTRODUODENOSCOPY (EGD) WITH PROPOFOL;  Surgeon: Lollie Sails, MD;  Location: [facility] ENDOSCOPY;  Service: Endoscopy;  Laterality: N/A;   Family History  Problem Relation Age of Onset  . Diabetes Mother   . Diabetes Father   . Stomach cancer Father   No bleeding or clotting  disorders       Social History  Substance Use Topics  . Smoking status: Current Every Day Smoker    Packs/day: 1.00    Years: 30.00    Types: Cigarettes  . Smokeless tobacco: Never Used  . Alcohol use No  No IVDU      Allergies  Allergen Reactions  . Shellfish Allergy Swelling  . Codeine Rash  . Lisinopril Cough    No current facility-administered medications for this visit.       REVIEW OF SYSTEMS(Negative unless checked)  Constitutional: [] ??Weight loss[] ??Fever[] ??Chills Cardiac:[] ??Chest pain[] ??Chest pressure[] ??Palpitations [] ??Shortness of breath when laying flat [] ??Shortness of breath at rest [x] ??Shortness of breath with exertion. Vascular: [] ??Pain in legs with walking[x] ??Pain in legsat rest[] ??Pain in legs when laying flat [] ??Claudication [] ??Pain in feet when walking [] ??Pain in feet at rest [] ??Pain in feet when laying flat [] ??History of DVT [] ??Phlebitis [x] ??Swelling in legs [] ??Varicose veins [x] ??Non-healing ulcers Pulmonary: [] ??Uses home oxygen [] ??Productive cough[] ??Hemoptysis [] ??Wheeze [x] ??COPD [] ??Asthma Neurologic: [] ??Dizziness [] ??Blackouts [] ??Seizures [] ??History of stroke [] ??History of TIA[] ??Aphasia [] ??Temporary blindness[] ??Dysphagia [] ??Weaknessor numbness in arms [] ??Weakness or numbnessin legs Musculoskeletal: [] ??Arthritis [] ??Joint swelling [x] ??Joint pain [] ??Low back pain Hematologic:[] ??Easy bruising[] ??Easy bleeding [] ??Hypercoagulable state [] ??Anemic [] ??Hepatitis Gastrointestinal:[] ??Blood in stool[] ??Vomiting blood[] ??Gastroesophageal reflux/heartburn[] ??Abdominal pain Genitourinary: [] ??Chronic kidney disease [] ??Difficulturination [] ??Frequenturination [] ??Burning with urination[] ??Hematuria Skin: [] ??Rashes [x] ??Ulcers [x] ??Wounds Psychological: [] ??History of anxiety[] ??History of  major depression.     Physical Examination  BP (!) 157/81 (BP Location: Right Arm)   Pulse 96   Resp 18   Ht 5\' 9"  (1.753 m)   Wt (!) 321 lb 9.6 oz (145.9 kg)   BMI 47.49 kg/m  Gen:  WD/WN, NAD. obese Head: [place]/AT, No temporalis wasting. Ear/Nose/Throat: Hearing grossly intact, nares w/o erythema or drainage Eyes: Conjunctiva clear. Sclera non-icteric Neck: Supple.  Trachea midline Pulmonary:  Good air movement, no use of accessory muscles.  Cardiac: RRR, no JVD Vascular:  Vessel Right Left  Radial Palpable Palpable                          PT Trace Palpable Not Palpable  DP 1+ Palpable 1+ Palpable   Gastrointestinal: soft, non-tender/non-distended.  Musculoskeletal: M/S 5/5 throughout.  No deformity or atrophy.  Erythema is present in both lower extremities with draining ulcerations on the posterior aspect of the left calf.  2+ right lower extremity edema.  3+ left lower extremity edema. Neurologic: Sensation grossly intact in extremities.  Symmetrical.  Speech is fluent.  Psychiatric: Judgment intact, Mood & affect appropriate for pt's clinical situation. Dermatologic: Erythema of the skin of both calves.  Ulceration is present on the posterior left calf as above       Labs No results found for this or any previous visit (from the past 2160 hour(s)).  Radiology No results found.  Assessment/Plan Obesity Discussed that weight loss would  be very beneficial for wound healing and leg swelling.  Essential hypertension, benign blood pressure control important in reducing the progression of atherosclerotic disease. On appropriate oral medications.   Hyperlipidemia lipid control important in reducing the progression of atherosclerotic disease. Continue statin therapy  DM2 (diabetes mellitus, type 2) (HCC) blood glucose control important in reducing the progression of atherosclerotic disease. Also, involved in wound healing. On appropriate medications.   Lower limb ulcer, calf, left, limited to breakdown of skin [facility]) Patient has a new ulceration of the left leg and some cellulitis bilaterally.  As such, a 3 layer Unna boot was placed bilaterally today.  This will be changed weekly.  We will reassess this in 3 weeks or so.  Lymphedema Symptoms have deteriorated.  He will go back into [employer] today.  Cellulitis I have given him a prescription for 10 days worth of Keflex today.  This will be in addition to [employer].  We will be doing weekly Unna boot changes and we will reassess him in a few weeks.    Leotis Pain, MD  05/03/2018 11:19 AM    This note was created with Dragon medical transcription system.  Any errors from dictation are purely unintentional

## 2018-05-03 NOTE — Assessment & Plan Note (Signed)
Patient has a new ulceration of the left leg and some cellulitis bilaterally.  As such, a 3 layer Unna boot was placed bilaterally today.  This will be changed weekly.  We will reassess this in 3 weeks or so.

## 2018-05-03 NOTE — Patient Instructions (Signed)

## 2018-05-03 NOTE — Assessment & Plan Note (Signed)
Symptoms have deteriorated.  He will go back into [employer] today.

## 2018-05-03 NOTE — Assessment & Plan Note (Signed)
I have given him a prescription for 10 days worth of Keflex today.  This will be in addition to [employer].  We will be doing weekly Unna boot changes and we will reassess him in a few weeks.

## 2018-05-04 ENCOUNTER — Other Ambulatory Visit: Payer: Self-pay | Admitting: Nurse Practitioner

## 2018-05-04 DIAGNOSIS — E559 Vitamin D deficiency, unspecified: Secondary | ICD-10-CM

## 2018-05-04 MED ORDER — MAGNESIUM 500 MG PO CAPS: 500.0000 mg | ORAL_CAPSULE | Freq: Two times a day (BID) | ORAL | 5 refills | Status: DC

## 2018-05-04 MED ORDER — GNP CALCIUM 1200 1200-1000 MG-UNIT PO CHEW: 1200.0000 mg | CHEWABLE_TABLET | Freq: Every day | ORAL | 5 refills | Status: DC

## 2018-05-05 ENCOUNTER — Other Ambulatory Visit: Payer: Self-pay | Admitting: Nurse Practitioner

## 2018-05-05 ENCOUNTER — Telehealth: Payer: Self-pay

## 2018-05-05 DIAGNOSIS — E559 Vitamin D deficiency, unspecified: Secondary | ICD-10-CM

## 2018-05-05 MED ORDER — VITAMIN D 25 MCG (1000 UNIT) PO TABS: 1000.0000 [IU] | ORAL_TABLET | Freq: Every day | ORAL | 5 refills | Status: AC

## 2018-05-05 MED ORDER — CALCIUM CARBONATE 600 MG PO TABS: 600.0000 mg | ORAL_TABLET | Freq: Two times a day (BID) | ORAL | 0 refills | Status: DC

## 2018-05-05 NOTE — Telephone Encounter (Signed)
Jana Half from [facility] called and said they do not have the Vitamin D/Calcium you prescribed for the patient. They want you to call them and discuss an alternative.

## 2018-05-05 NOTE — Telephone Encounter (Signed)
New Rx sent in and left a message with pharmacy. Thanks

## 2018-05-10 ENCOUNTER — Ambulatory Visit (INDEPENDENT_AMBULATORY_CARE_PROVIDER_SITE_OTHER): Payer: Medicaid Other | Admitting: Vascular Surgery

## 2018-05-10 ENCOUNTER — Other Ambulatory Visit: Payer: Self-pay

## 2018-05-10 ENCOUNTER — Encounter (INDEPENDENT_AMBULATORY_CARE_PROVIDER_SITE_OTHER): Payer: Self-pay

## 2018-05-10 VITALS — BP 148/86 | HR 98 | Resp 16 | Ht 69.0 in | Wt 329.0 lb

## 2018-05-10 DIAGNOSIS — L97221 Non-pressure chronic ulcer of left calf limited to breakdown of skin: Secondary | ICD-10-CM

## 2018-05-10 DIAGNOSIS — I890 Lymphedema, not elsewhere classified: Secondary | ICD-10-CM | POA: Diagnosis not present

## 2018-05-10 NOTE — Progress Notes (Signed)
History of Present Illness BLE swelling and ulceration  Assessments & Plan   There are no diagnoses linked to this encounter.    Additional instructions  Subjective:  Patient presents with venous ulcer of the Bilateral lower extremity.    Procedure:  3 layer unna wrap was placed Bilateral lower extremity.   Plan:   Follow up in one week.

## 2018-05-17 ENCOUNTER — Ambulatory Visit (INDEPENDENT_AMBULATORY_CARE_PROVIDER_SITE_OTHER): Payer: Medicaid Other | Admitting: Nurse Practitioner

## 2018-05-17 ENCOUNTER — Other Ambulatory Visit: Payer: Self-pay

## 2018-05-17 ENCOUNTER — Encounter (INDEPENDENT_AMBULATORY_CARE_PROVIDER_SITE_OTHER): Payer: Self-pay | Admitting: Nurse Practitioner

## 2018-05-17 VITALS — BP 193/120 | HR 112 | Resp 14 | Ht 69.0 in | Wt 330.0 lb

## 2018-05-17 DIAGNOSIS — F17210 Nicotine dependence, cigarettes, uncomplicated: Secondary | ICD-10-CM

## 2018-05-17 DIAGNOSIS — L03116 Cellulitis of left lower limb: Secondary | ICD-10-CM | POA: Diagnosis not present

## 2018-05-17 DIAGNOSIS — E119 Type 2 diabetes mellitus without complications: Secondary | ICD-10-CM | POA: Diagnosis not present

## 2018-05-17 DIAGNOSIS — E785 Hyperlipidemia, unspecified: Secondary | ICD-10-CM | POA: Diagnosis not present

## 2018-05-17 DIAGNOSIS — I10 Essential (primary) hypertension: Secondary | ICD-10-CM

## 2018-05-17 MED ORDER — DOXYCYCLINE HYCLATE 100 MG PO CAPS: 100.0000 mg | ORAL_CAPSULE | Freq: Two times a day (BID) | ORAL | 0 refills | Status: DC

## 2018-05-17 NOTE — Progress Notes (Signed)
SUBJECTIVE:  Patient ID: [name], male    DOB: [birth year], 53 y.o.   MRN: [id number] Chief Complaint  Patient presents with   Follow-up    HPI  [name] is a 53 y.o. male presents today for evaluation of his lower extremity cellulitis as well as ulcer.  The ulcerations are very close to healed, however there is still some lingering evidence of cellulitis.  The patient does state that he has some burning and stinging within his legs.  He also states that his swelling seems to be a little bit worse above his knees.  He currently is not utilizing his lymphedema pump as he prefers not to use it with ulcerations.  He denies any fevers, chills, nausea, vomiting or diarrhea.  He denies any chest pain or shortness of breath.  He denies any TIA-like symptoms.  Past Medical History:  Diagnosis Date   COPD (chronic obstructive pulmonary disease) ([place])    Coronary artery disease    Diabetes mellitus without complication ([place])    Hypertension    Peptic ulcer    Sleep apnea     Past Surgical History:  Procedure Laterality Date   COLONOSCOPY WITH PROPOFOL N/A 11/09/2016   Procedure: COLONOSCOPY WITH PROPOFOL;  Surgeon: Lollie Sails, MD;  Location: [facility] ENDOSCOPY;  Service: Endoscopy;  Laterality: N/A;   COLONOSCOPY WITH PROPOFOL N/A 07/06/2017   Procedure: COLONOSCOPY WITH PROPOFOL;  Surgeon: Lollie Sails, MD;  Location: [facility] ENDOSCOPY;  Service: Endoscopy;  Laterality: N/A;   ESOPHAGOGASTRODUODENOSCOPY (EGD) WITH PROPOFOL N/A 11/09/2016   Procedure: ESOPHAGOGASTRODUODENOSCOPY (EGD) WITH PROPOFOL;  Surgeon: Lollie Sails, MD;  Location: [facility] ENDOSCOPY;  Service: Endoscopy;  Laterality: N/A;   ESOPHAGOGASTRODUODENOSCOPY (EGD) WITH PROPOFOL N/A 07/06/2017   Procedure: ESOPHAGOGASTRODUODENOSCOPY (EGD) WITH PROPOFOL;  Surgeon: Lollie Sails, MD;  Location: [facility] ENDOSCOPY;  Service: Endoscopy;  Laterality: N/A;    Social History   Socioeconomic  History   Marital status: Single    Spouse name: Not on file   Number of children: Not on file   Years of education: Not on file   Highest education level: Not on file  Occupational History   Not on file  Social Needs   Financial resource strain: Not on file   Food insecurity:    Worry: Not on file    Inability: Not on file   Transportation needs:    Medical: Not on file    Non-medical: Not on file  Tobacco Use   Smoking status: Current Every Day Smoker    Packs/day: 1.00    Years: 30.00    Pack years: 30.00    Types: Cigarettes   Smokeless tobacco: Never Used  Substance and Sexual Activity   Alcohol use: No   Drug use: No   Sexual activity: Never  Lifestyle   Physical activity:    Days per week: Not on file    Minutes per session: Not on file   Stress: Not on file  Relationships   Social connections:    Talks on phone: Not on file    Gets together: Not on file    Attends religious service: Not on file    Active member of club or organization: Not on file    Attends meetings of clubs or organizations: Not on file    Relationship status: Not on file   Intimate partner violence:    Fear of current or ex partner: Not on file    Emotionally abused: Not on  file    Physically abused: Not on file    Forced sexual activity: Not on file  Other Topics Concern   Not on file  Social History Narrative   Not on file    Family History  Problem Relation Age of Onset   Diabetes Mother    Diabetes Father    Stomach cancer Father     Allergies  Allergen Reactions   Shellfish Allergy Swelling   Codeine Rash   Lisinopril Cough     Review of Systems   Review of Systems: Negative Unless Checked Constitutional: [] Weight loss  [] Fever  [] Chills Cardiac: [] Chest pain   []  Atrial Fibrillation  [] Palpitations   [] Shortness of breath when laying flat   [] Shortness of breath with exertion. [] Shortness of breath at rest Vascular:  [] Pain in legs with  walking   [] Pain in legs with standing [] Pain in legs when laying flat   [] Claudication    [] Pain in feet when laying flat    [] History of DVT   [] Phlebitis   [x] Swelling in legs   [x] Varicose veins   [] Non-healing ulcers Pulmonary:   [] Uses home oxygen   [] Productive cough   [] Hemoptysis   [] Wheeze  [] COPD   [] Asthma Neurologic:  [] Dizziness   [] Seizures  [] Blackouts [] History of stroke   [] History of TIA  [] Aphasia   [] Temporary Blindness   [] Weakness or numbness in arm   [] Weakness or numbness in leg Musculoskeletal:   [] Joint swelling   [] Joint pain   [x] Low back pain  []  History of Knee Replacement [x] Arthritis [] back Surgeries  []  Spinal Stenosis    Hematologic:  [] Easy bruising  [] Easy bleeding   [] Hypercoagulable state   [] Anemic Gastrointestinal:  [] Diarrhea   [] Vomiting  [] Gastroesophageal reflux/heartburn   [] Difficulty swallowing. [] Abdominal pain Genitourinary:  [] Chronic kidney disease   [] Difficult urination  [] Anuric   [] Blood in urine [] Frequent urination  [] Burning with urination   [] Hematuria Skin:  [] Rashes   [] Ulcers [] Wounds Psychological:  [] History of anxiety   []  History of major depression  []  Memory Difficulties      OBJECTIVE:   Physical Exam  BP (!) 193/120 (BP Location: Left Wrist, Patient Position: Sitting, Cuff Size: Small)    Pulse (!) 112    Resp 14    Ht 5\' 9"  (1.753 m)    Wt (!) 330 lb (149.7 kg)    BMI 48.73 kg/m   Gen: WD/WN, NAD Head: [place]/AT, No temporalis wasting.  Ear/Nose/Throat: Hearing grossly intact, nares w/o erythema or drainage Eyes: PER, EOMI, sclera nonicteric.  Neck: Supple, no masses.  No JVD.  Pulmonary:  Good air movement, no use of accessory muscles.  Cardiac: RRR Vascular:  Vessel Right Left  Radial Palpable Palpable  Dorsalis Pedis Palpable Palpable  Posterior Tibial Palpable Palpable   Gastrointestinal: soft, non-distended. No guarding/no peritoneal signs.  Musculoskeletal: M/S 5/5 throughout.  No deformity or atrophy.    Neurologic: Pain and light touch intact in extremities.  Symmetrical.  Speech is fluent. Motor exam as listed above. Psychiatric: Judgment intact, Mood & affect appropriate for pt's clinical situation. Dermatologic:  Bilateral stasis dermatitis as well erythema, consistent with cellulitis. lymph : No Cervical lymphadenopathy, extensive bilateral lichenification     ASSESSMENT AND PLAN:  1. Cellulitis of left lower extremity We will continue bilateral Unna wraps at this time.  We will also do a 14-day course of doxycycline, since his last prescription was Keflex and it did not seem to help.  Advised him to continue  to elevate his legs as much as possible.  I advised him that it was perfectly appropriate for him to utilize his lymphedema pump at this time.  The patient will follow-up in 4 weeks for wound evaluation, in the meantime he will present to the office weekly for wound wrap changes.  - doxycycline (VIBRAMYCIN) 100 MG capsule; Take 1 capsule (100 mg total) by mouth 2 (two) times daily.  Dispense: 28 capsule; Refill: 0  2. Essential hypertension, benign Continue antihypertensive medications as already ordered, these medications have been reviewed and there are no changes at this time.   3. Type 2 diabetes mellitus without complication, without long-term current use of insulin (HCC) Continue hypoglycemic medications as already ordered, these medications have been reviewed and there are no changes at this time.  Hgb A1C to be monitored as already arranged by primary service   4. Hyperlipidemia, unspecified hyperlipidemia type Continue statin as ordered and reviewed, no changes at this time    Current Outpatient Medications on File Prior to Visit  Medication Sig Dispense Refill   albuterol (VENTOLIN HFA) 108 (90 Base) MCG/ACT inhaler Inhale 2 puffs into the lungs every 6 (six) hours as needed for wheezing or shortness of breath.     aspirin-acetaminophen-caffeine (EXCEDRIN  MIGRAINE) 250-250-65 MG tablet Take by mouth every 6 (six) hours as needed for headache.     atorvastatin (LIPITOR) 20 MG tablet Take 20 mg by mouth at bedtime.      buPROPion (WELLBUTRIN SR) 150 MG 12 hr tablet Take 150 mg by mouth 2 (two) times daily.     calcium carbonate (CALCIUM 600) 600 MG TABS tablet Take 1 tablet (600 mg total) by mouth 2 (two) times daily with a meal for 30 days. 60 tablet 0   cholecalciferol (VITAMIN D3) 25 MCG (1000 UT) tablet Take 1 tablet (1,000 Units total) by mouth daily. 30 tablet 5   Fluticasone-Salmeterol (ADVAIR DISKUS) 100-50 MCG/DOSE AEPB Inhale 1 puff into the lungs 2 (two) times daily.      furosemide (LASIX) 40 MG tablet Take 1 tablet (40 mg total) by mouth daily. 30 tablet 0   hydrochlorothiazide (MICROZIDE) 12.5 MG capsule Take 12.5 mg by mouth daily.   0   loratadine (CLARITIN) 10 MG tablet Take 10 mg by mouth daily.     losartan (COZAAR) 50 MG tablet Take 1 tablet (50 mg total) by mouth daily. 30 tablet 0   Magnesium 500 MG CAPS Take 1 capsule (500 mg total) by mouth 2 (two) times daily at 8 am and 10 pm. 60 capsule 5   meloxicam (MOBIC) 15 MG tablet Take 1 tablet (15 mg total) by mouth daily. 30 tablet 2   metFORMIN (GLUCOPHAGE) 1000 MG tablet Take 1,000 mg by mouth 2 (two) times daily with a meal.     oxybutynin (DITROPAN) 5 MG tablet Take 5 mg by mouth every 12 (twelve) hours.     pantoprazole (PROTONIX) 40 MG tablet Take 40 mg by mouth 2 (two) times daily.      tiZANidine (ZANAFLEX) 4 MG tablet Take 1 tablet (4 mg total) by mouth every 8 (eight) hours as needed for muscle spasms. 90 tablet 2   No current facility-administered medications on file prior to visit.     There are no Patient Instructions on file for this visit. No follow-ups on file.   Kris Hartmann, NP  This note was completed with Sales executive.  Any errors are purely unintentional.

## 2018-05-19 ENCOUNTER — Encounter: Payer: Self-pay | Admitting: *Deleted

## 2018-05-19 ENCOUNTER — Other Ambulatory Visit: Payer: Self-pay

## 2018-05-19 ENCOUNTER — Emergency Department: Payer: Medicaid Other

## 2018-05-19 ENCOUNTER — Emergency Department
Admission: EM | Admit: 2018-05-19 | Discharge: 2018-05-19 | Disposition: A | Discharge status: Home / Self Care | Payer: Medicaid Other | Attending: Emergency Medicine | Admitting: Emergency Medicine

## 2018-05-19 DIAGNOSIS — E119 Type 2 diabetes mellitus without complications: Secondary | ICD-10-CM | POA: Insufficient documentation

## 2018-05-19 DIAGNOSIS — I2510 Atherosclerotic heart disease of native coronary artery without angina pectoris: Secondary | ICD-10-CM | POA: Insufficient documentation

## 2018-05-19 DIAGNOSIS — Z79899 Other long term (current) drug therapy: Secondary | ICD-10-CM | POA: Insufficient documentation

## 2018-05-19 DIAGNOSIS — Y998 Other external cause status: Secondary | ICD-10-CM | POA: Insufficient documentation

## 2018-05-19 DIAGNOSIS — W2209XA Striking against other stationary object, initial encounter: Secondary | ICD-10-CM | POA: Diagnosis not present

## 2018-05-19 DIAGNOSIS — Y92009 Unspecified place in unspecified non-institutional (private) residence as the place of occurrence of the external cause: Secondary | ICD-10-CM | POA: Insufficient documentation

## 2018-05-19 DIAGNOSIS — Y9389 Activity, other specified: Secondary | ICD-10-CM | POA: Insufficient documentation

## 2018-05-19 DIAGNOSIS — S99921A Unspecified injury of right foot, initial encounter: Secondary | ICD-10-CM | POA: Diagnosis not present

## 2018-05-19 DIAGNOSIS — J449 Chronic obstructive pulmonary disease, unspecified: Secondary | ICD-10-CM | POA: Diagnosis not present

## 2018-05-19 DIAGNOSIS — W19XXXA Unspecified fall, initial encounter: Secondary | ICD-10-CM

## 2018-05-19 DIAGNOSIS — I10 Essential (primary) hypertension: Secondary | ICD-10-CM | POA: Diagnosis not present

## 2018-05-19 DIAGNOSIS — F17210 Nicotine dependence, cigarettes, uncomplicated: Secondary | ICD-10-CM | POA: Diagnosis not present

## 2018-05-19 DIAGNOSIS — M79671 Pain in right foot: Secondary | ICD-10-CM

## 2018-05-19 LAB — BASIC METABOLIC PANEL
Anion gap: 13 (ref 5–15)
BUN: 11 mg/dL (ref 6–20)
CO2: 24 mmol/L (ref 22–32)
Calcium: 8.8 mg/dL — ABNORMAL LOW (ref 8.9–10.3)
Chloride: 102 mmol/L (ref 98–111)
Creatinine, Ser: 0.87 mg/dL (ref 0.61–1.24)
GFR calc Af Amer: >60 mL/min (ref >60)
GFR calc non Af Amer: >60 mL/min (ref >60)
Glucose, Bld: 110 mg/dL — ABNORMAL HIGH (ref 70–99)
Potassium: 3.8 mmol/L (ref 3.5–5.1)
Sodium: 139 mmol/L (ref 135–145)

## 2018-05-19 LAB — CBC
HCT: 42.6 % (ref 39.0–52.0)
Hemoglobin: 13.9 g/dL (ref 13.0–17.0)
MCH: 28.5 pg (ref 26.0–34.0)
MCHC: 32.6 g/dL (ref 30.0–36.0)
MCV: 87.3 fL (ref 80.0–100.0)
Platelets: 221 10*3/uL (ref 150–400)
RBC: 4.88 MIL/uL (ref 4.22–5.81)
RDW: 13.6 % (ref 11.5–15.5)
WBC: 7.9 10*3/uL (ref 4.0–10.5)
nRBC: 0 % (ref 0.0–0.2)

## 2018-05-19 LAB — TROPONIN I
Troponin I: 0.04 ng/mL (ref <0.03)
Troponin I: 0.05 ng/mL (ref <0.03)

## 2018-05-19 MED ORDER — TRAMADOL HCL 50 MG PO TABS
50.0000 mg | ORAL_TABLET | Freq: Once | ORAL | Status: AC
  Administered 2018-05-19: 50 mg via ORAL
  Filled 2018-05-19: qty 1

## 2018-05-19 MED ORDER — SODIUM CHLORIDE 0.9% FLUSH: 3.0000 mL | Freq: Once | INTRAVENOUS | Status: DC

## 2018-05-19 MED ORDER — OXYCODONE HCL 5 MG PO TABS
5.0000 mg | ORAL_TABLET | Freq: Once | ORAL | Status: DC
  Filled 2018-05-19: qty 1

## 2018-05-19 MED ORDER — DOXYCYCLINE HYCLATE 100 MG PO TABS
100.0000 mg | ORAL_TABLET | Freq: Once | ORAL | Status: AC
  Administered 2018-05-19: 19:00:00 100 mg via ORAL
  Filled 2018-05-19: qty 1

## 2018-05-19 MED ORDER — TRAMADOL HCL 50 MG PO TABS: 50.0000 mg | ORAL_TABLET | Freq: Four times a day (QID) | ORAL | 0 refills | Status: DC | PRN

## 2018-05-19 MED ORDER — ACETAMINOPHEN 500 MG PO TABS
1000.0000 mg | ORAL_TABLET | Freq: Once | ORAL | Status: AC
  Administered 2018-05-19: 1000 mg via ORAL
  Filled 2018-05-19: qty 2

## 2018-05-19 NOTE — Discharge Instructions (Addendum)
Pain control: Take tylenol 1000mg every 8 hours. Take 50mg of tramadol every 6 hours for breakthrough pain. If you need the tramadol make sure to take one senokot as well to prevent constipation. ° °Do not drink alcohol, drive or participate in any other potentially dangerous activities while taking this medication as it may make you sleepy. Do not take this medication with any other sedating medications, either prescription or over-the-counter. ° °

## 2018-05-19 NOTE — ED Triage Notes (Addendum)
Pt to triage via wheelchair.  Pt fell asleep last night in a chair and fell backwards.  No loc. No blood thinners   Right foot struck a cabinet.  Pt has skin tears/lacs to right 3rd and 4th toes. Pt has cellulitis in both lower legs.  Una boots to both lower legs. Pt alert.  Speech clear.

## 2018-05-19 NOTE — ED Notes (Signed)
Date and time results received: 05/19/18 4:49 PM  (use smartphrase ".now" to insert current time)  Test: TropI  Critical Value: 0.04  Name of Provider Notified: Alfred Levins  Orders Received? Or Actions Taken?: No new orders @ this time.

## 2018-05-19 NOTE — ED Notes (Addendum)
Pt states he fell asleep and fell over in his chair at approx 4 AM, his feet came up and hit the corner of the bathroom counter resulting in lacerations to the 2nd, 3rd, and 4th digits of his right foot. Pt denies hitting head or LoC. Both LE are compression wrapped. Pt has Hx of type 2 diabetes, Tx with metformin twice a day. Pt states his last visit to his podiatrist was in November 2019.

## 2018-05-19 NOTE — ED Notes (Signed)
Abrasions on toes 2,3 &4 cleaned with chlorhexidine soap and sterile water. New coban compression bandage replaced on R leg. Pt given wood sole post op shoe and instructed on proper wear. Pt educated on importance of foot hygiene for diabetics and periodic visits to podiatry for nail care and evaluation. Pt verbalized understanding.

## 2018-05-19 NOTE — ED Provider Notes (Signed)
[facility] Emergency Department Provider Note  ____________________________________________  Time seen: Approximately 6:54 PM  I have reviewed the triage vital signs and the nursing notes.   HISTORY  Chief Complaint Fall and Foot Injury   HPI [name] is a 53 y.o. male with a history of COPD, CAD, diabetes, hypertension, bilateral lower extremity lymphedema who presents for evaluation of right foot pain.  Patient reports that he fell asleep on a chair yesterday and fell off the chair.  He hit his foot onto a cabinet.  He has had severe pain mostly with weightbearing located diffusely on his toes.  He also noted some dried blood this morning.  He has not tried anything at home for the pain.  He denies head trauma or LOC, his not on any blood thinners.  He denies neck pain or back pain.  Patient is currently on antibiotics for bilateral lower extremity cellulitis.  Saw his vascular team 2 days ago and was switched from Keflex to doxycycline.  He denies fever or chills, leg pain, nausea or vomiting.   Past Medical History:  Diagnosis Date   COPD (chronic obstructive pulmonary disease) ([place])    Coronary artery disease    Diabetes mellitus without complication ([place])    Hypertension    Peptic ulcer    Sleep apnea     Patient Active Problem List   Diagnosis Date Noted   Osteoarthritis of facet joint of lumbar spine 02/14/2018   Osteoarthritis involving multiple joints 02/14/2018   Chronic musculoskeletal pain 02/14/2018   Neurogenic pain 02/14/2018   Abnormal MRI, lumbar spine 02/14/2018   History of allergy to shellfish 01/27/2018   Morbid obesity with BMI of 45.0-49.9, adult ([place]) 12/13/2017   Swelling of limb 11/26/2017   Lower limb ulcer, calf, left, limited to breakdown of skin ( [place]) 11/26/2017   DM2 (diabetes mellitus, type 2) ([place]) 11/03/2017   Neurogenic bladder 11/03/2017   Spondylosis without myelopathy or radiculopathy,  lumbar region 11/03/2017   Lumbar facet syndrome (Bilateral) (R>L) 11/03/2017   DDD (degenerative disc disease), lumbar 11/02/2017   Lumbar facet hypertrophy (Bilateral) 11/02/2017   Lumbar central spinal stenosis (L4-5) 11/02/2017   Lumbar lateral recess stenosis (L4-5) (Bilateral) (L>R) 11/02/2017   Vitamin D insufficiency 11/02/2017   Lumbar spondylosis 11/02/2017   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   Grief 10/18/2017   Chronic low back pain (Primary Area of Pain) (Bilateral) (R>L) w/ sciatica (Bilateral) 10/07/2017   Chronic lower extremity pain (Secondary Area of Pain) (Bilateral) (R>L) 10/07/2017   Chronic pain syndrome 10/07/2017   Pharmacologic therapy 10/07/2017   Disorder of skeletal system 10/07/2017   Problems influencing health status 10/07/2017   Barrett's esophagus without dysplasia 03/10/2017   Colon cancer screening 10/14/2016   Essential hypertension, benign 02/11/2016   Hyperlipidemia 02/11/2016   Tobacco dependence 02/11/2016   Obesity 02/11/2016   Lower limb ulcer, ankle, left, with fat layer exposed ([place]) 02/11/2016   Lymphedema 02/11/2016   Cellulitis 01/06/2016    Past Surgical History:  Procedure Laterality Date   COLONOSCOPY WITH PROPOFOL N/A 11/09/2016   Procedure: COLONOSCOPY WITH PROPOFOL;  Surgeon: Lollie Sails, MD;  Location: [facility] ENDOSCOPY;  Service: Endoscopy;  Laterality: N/A;   COLONOSCOPY WITH PROPOFOL N/A 07/06/2017   Procedure: COLONOSCOPY WITH PROPOFOL;  Surgeon: Lollie Sails, MD;  Location: [facility] ENDOSCOPY;  Service: Endoscopy;  Laterality: N/A;   ESOPHAGOGASTRODUODENOSCOPY (EGD) WITH PROPOFOL N/A 11/09/2016   Procedure: ESOPHAGOGASTRODUODENOSCOPY (EGD) WITH PROPOFOL;  Surgeon: Lollie Sails, MD;  Location: [facility] ENDOSCOPY;  Service: Endoscopy;  Laterality: N/A;   ESOPHAGOGASTRODUODENOSCOPY (EGD) WITH PROPOFOL N/A 07/06/2017   Procedure:  ESOPHAGOGASTRODUODENOSCOPY (EGD) WITH PROPOFOL;  Surgeon: Lollie Sails, MD;  Location: [facility] ENDOSCOPY;  Service: Endoscopy;  Laterality: N/A;    Prior to Admission medications   Medication Sig Start Date End Date Taking? Authorizing Provider  albuterol (VENTOLIN HFA) 108 (90 Base) MCG/ACT inhaler Inhale 2 puffs into the lungs every 6 (six) hours as needed for wheezing or shortness of breath.    [provider]  aspirin-acetaminophen-caffeine (EXCEDRIN MIGRAINE) [phone] MG tablet Take by mouth every 6 (six) hours as needed for headache.    [provider]  atorvastatin (LIPITOR) 20 MG tablet Take 20 mg by mouth at bedtime.     [provider]  buPROPion (WELLBUTRIN SR) 150 MG 12 hr tablet Take 150 mg by mouth 2 (two) times daily.    [provider]  calcium carbonate (CALCIUM 600) 600 MG TABS tablet Take 1 tablet (600 mg total) by mouth 2 (two) times daily with a meal for 30 days. 05/05/18 06/04/18  Vevelyn Francois, NP  cholecalciferol (VITAMIN D3) 25 MCG (1000 UT) tablet Take 1 tablet (1,000 Units total) by mouth daily. 05/05/18 11/01/18  Vevelyn Francois, NP  doxycycline (VIBRAMYCIN) 100 MG capsule Take 1 capsule (100 mg total) by mouth 2 (two) times daily. 05/17/18   Kris Hartmann, NP  Fluticasone-Salmeterol (ADVAIR DISKUS) 100-50 MCG/DOSE AEPB Inhale 1 puff into the lungs 2 (two) times daily.     [provider]  furosemide (LASIX) 40 MG tablet Take 1 tablet (40 mg total) by mouth daily. 01/09/16   Loletha Grayer, MD  hydrochlorothiazide (MICROZIDE) 12.5 MG capsule Take 12.5 mg by mouth daily.     [provider]  loratadine (CLARITIN) 10 MG tablet Take 10 mg by mouth daily.    [provider]  losartan (COZAAR) 50 MG tablet Take 1 tablet (50 mg total) by mouth daily. 01/09/16   Loletha Grayer, MD  Magnesium 500 MG CAPS Take 1 capsule (500 mg total) by mouth 2 (two) times daily at 8 am and 10 pm. 05/04/18 10/31/18  Vevelyn Francois, NP  meloxicam (MOBIC) 15 MG tablet Take 1 tablet (15 mg total) by mouth daily. 04/11/18 07/10/18  Vevelyn Francois, NP  metFORMIN (GLUCOPHAGE) 1000 MG tablet Take 1,000 mg by mouth 2 (two) times daily with a meal.    [provider]  oxybutynin (DITROPAN) 5 MG tablet Take 5 mg by mouth every 12 (twelve) hours.    [provider]  pantoprazole (PROTONIX) 40 MG tablet Take 40 mg by mouth 2 (two) times daily.     [provider]  tiZANidine (ZANAFLEX) 4 MG tablet Take 1 tablet (4 mg total) by mouth every 8 (eight) hours as needed for muscle spasms. 04/11/18 07/10/18  Vevelyn Francois, NP  traMADol (ULTRAM) 50 MG tablet Take 1 tablet (50 mg total) by mouth every 6 (six) hours as needed. 05/19/18 05/19/19  Rudene Re, MD    Allergies Shellfish allergy; Codeine; and Lisinopril  Family History  Problem Relation Age of Onset   Diabetes Mother    Diabetes Father    Stomach cancer Father     Social History Social History   Tobacco Use   Smoking status: Current Every Day Smoker    Packs/day: 1.00    Years: 30.00    Pack years: 30.00  Types: Cigarettes   Smokeless tobacco: Never Used  Substance Use Topics   Alcohol use: No   Drug use: No    Review of Systems  Constitutional: Negative for fever. Eyes: Negative for visual changes. ENT: Negative for sore throat. Neck: No neck pain  Cardiovascular: Negative for chest pain. Respiratory: Negative for shortness of breath. Gastrointestinal: Negative for abdominal pain, vomiting or diarrhea. Genitourinary: Negative for dysuria. Musculoskeletal: Negative for back pain. + R foot pain Skin: Negative for rash. Neurological: Negative for headaches, weakness or numbness. Psych: No SI or HI  ____________________________________________   PHYSICAL EXAM:  VITAL SIGNS: ED Triage Vitals  Enc Vitals Group     BP 05/19/18 1605 (!) 183/83     Pulse Rate 05/19/18 1605 (!) 103     Resp 05/19/18 1605  18     Temp 05/19/18 1605 98.2 F (36.8 C)     Temp Source 05/19/18 1605 Oral     SpO2 05/19/18 1605 97 %     Weight 05/19/18 1602 (!) 330 lb (149.7 kg)     Height 05/19/18 1602 5\' 9"  (1.753 m)     Head Circumference --      Peak Flow --      Pain Score 05/19/18 1602 8     Pain Loc --      Pain Edu? --      Excl. in [place]? --     Constitutional: Alert and oriented, morbidly obese in no distress.  HEENT:      Head: Normocephalic and atraumatic.         Eyes: Conjunctivae are normal. Sclera is non-icteric.       Mouth/Throat: Mucous membranes are moist.       Neck: Supple with no signs of meningismus.  No C-spine tenderness  cardiovascular: Regular rate and rhythm.  Respiratory: Normal respiratory effort. Lungs are clear to auscultation bilaterally. No wheezes, crackles, or rhonchi.  Gastrointestinal: Soft, non tender, and non distended with positive bowel sounds. No rebound or guarding. Musculoskeletal: 2+ pitting edema bilaterally, with warmth and erythema extending to knees bilaterally. Skin abrasion of R toes with dry blood, no laceration, diffuse tenderness to palpation of the toes with no metatarsal tenderness, full painless range of motion of the ankle.   Neurologic: Normal speech and language. Face is symmetric. Moving all extremities. No gross focal neurologic deficits are appreciated. Skin: Skin is warm, dry and intact. No rash noted. Psychiatric: Mood and affect are normal. Speech and behavior are normal.  ____________________________________________   LABS (all labs ordered are listed, but only abnormal results are displayed)  Labs Reviewed  BASIC METABOLIC PANEL - Abnormal; Notable for the following components:      Result Value   Glucose, Bld 110 (*)    Calcium 8.8 (*)    All other components within normal limits  TROPONIN I - Abnormal; Notable for the following components:   Troponin I 0.04 (*)    All other components within normal limits  TROPONIN I - Abnormal;  Notable for the following components:   Troponin I 0.05 (*)    All other components within normal limits  CBC   ____________________________________________  EKG  ED ECG REPORT I, Rudene Re, the attending physician, personally viewed and interpreted this ECG.  Normal sinus rhythm, rate of 97, normal intervals, normal axis, no ST elevations or depressions.  Normal EKG. ____________________________________________  RADIOLOGY  I have personally reviewed the images performed during this visit and I agree with the Radiologist's  read.   Interpretation by Radiologist:  Dg Foot Complete Right  Result Date: 05/19/2018 CLINICAL DATA:  Right foot injury after fall today. EXAM: RIGHT FOOT COMPLETE - 3+ VIEW COMPARISON:  None. FINDINGS: There is no evidence of fracture or dislocation. Joint spaces are intact. Moderate posterior calcaneal spurring is noted. Dressing is seen involving the foot and ankle. IMPRESSION: No acute abnormality seen in the right foot Electronically Signed   By: Marijo Conception M.D.   On: 05/19/2018 17:42    ____________________________________________   PROCEDURES  Procedure(s) performed: None Procedures Critical Care performed:  None ____________________________________________   INITIAL IMPRESSION / ASSESSMENT AND PLAN / ED COURSE   53 y.o. male with a history of COPD, CAD, diabetes, hypertension, bilateral lower extremity lymphedema who presents for evaluation of right foot pain after falling from a chair and hitting his foot on a cabinet.  No obvious lacerations, some dry blood around the toes with tenderness.  X-ray negative for fractures.  Patient was put on hard sole shoe.  Currently on antibiotics for cellulitis.  No signs of sepsis with no fever, no tachycardia, no tachypnea, no white count.  His doctor has just switched him from Keflex to doxycycline.  He has not been able to pick up his prescription.  Will give the first dose of doxycycline here.   His labs showed a troponin of 0.04 with no prior values to be compared to.  Patient denies chest pain and shortness of breath.  His EKG is nonischemic.  Second troponin 4 hours apart showed no significant change , most likely patient's baseline. Pain controlled with Tylenol and oxycodone.  Patient given walker to assist with ambulation at home.  Discussed close follow-up with PCP and my standard return precautions.      As part of my medical decision making, I reviewed the following data within the [place] notes reviewed and incorporated, Labs reviewed , EKG interpreted , Old chart reviewed, Radiograph reviewed , Notes from prior ED visits and [place] Controlled Substance Database    Pertinent labs & imaging results that were available during my care of the patient were reviewed by me and considered in my medical decision making (see chart for details).    ____________________________________________   FINAL CLINICAL IMPRESSION(S) / ED DIAGNOSES  Final diagnoses:  Right foot pain  Fall, initial encounter      NEW MEDICATIONS STARTED DURING THIS VISIT:  ED Discharge Orders         Ordered    traMADol (ULTRAM) 50 MG tablet  Every 6 hours PRN     05/19/18 2109           Note:  This document was prepared using Dragon voice recognition software and may include unintentional dictation errors.    Alfred Levins, [place], MD 05/19/18 2110

## 2018-05-24 ENCOUNTER — Other Ambulatory Visit (INDEPENDENT_AMBULATORY_CARE_PROVIDER_SITE_OTHER): Payer: Self-pay | Admitting: Nurse Practitioner

## 2018-05-24 ENCOUNTER — Other Ambulatory Visit: Payer: Self-pay

## 2018-05-24 ENCOUNTER — Encounter (INDEPENDENT_AMBULATORY_CARE_PROVIDER_SITE_OTHER): Payer: Self-pay

## 2018-05-24 ENCOUNTER — Ambulatory Visit (INDEPENDENT_AMBULATORY_CARE_PROVIDER_SITE_OTHER): Payer: Medicaid Other | Admitting: Nurse Practitioner

## 2018-05-24 VITALS — BP 134/86 | HR 96 | Resp 10 | Ht 69.0 in | Wt 330.0 lb

## 2018-05-24 DIAGNOSIS — L97919 Non-pressure chronic ulcer of unspecified part of right lower leg with unspecified severity: Secondary | ICD-10-CM | POA: Diagnosis not present

## 2018-05-24 DIAGNOSIS — L97221 Non-pressure chronic ulcer of left calf limited to breakdown of skin: Secondary | ICD-10-CM

## 2018-05-24 NOTE — Progress Notes (Signed)
History of Present Illness  There is no documented history at this time  Assessments & Plan   There are no diagnoses linked to this encounter.    Additional instructions  Subjective:  Patient presents with venous ulcer of the Bilateral lower extremity.    Procedure:  3 layer unna wrap was placed Bilateral lower extremity.   Plan:   Follow up in one week.  

## 2018-05-31 ENCOUNTER — Other Ambulatory Visit: Payer: Self-pay

## 2018-05-31 ENCOUNTER — Ambulatory Visit (INDEPENDENT_AMBULATORY_CARE_PROVIDER_SITE_OTHER): Payer: Medicaid Other | Admitting: Nurse Practitioner

## 2018-05-31 ENCOUNTER — Encounter (INDEPENDENT_AMBULATORY_CARE_PROVIDER_SITE_OTHER): Payer: Self-pay

## 2018-05-31 VITALS — BP 128/86 | HR 109 | Resp 12 | Ht 69.0 in | Wt 334.0 lb

## 2018-05-31 DIAGNOSIS — L97322 Non-pressure chronic ulcer of left ankle with fat layer exposed: Secondary | ICD-10-CM | POA: Diagnosis not present

## 2018-05-31 DIAGNOSIS — L97919 Non-pressure chronic ulcer of unspecified part of right lower leg with unspecified severity: Secondary | ICD-10-CM | POA: Diagnosis not present

## 2018-05-31 NOTE — Progress Notes (Signed)
History of Present Illness  There is no documented history at this time  Assessments & Plan   There are no diagnoses linked to this encounter.    Additional instructions  Subjective:  Patient presents with venous ulcer of the Bilateral lower extremity.    Procedure:  3 layer unna wrap was placed Bilateral lower extremity.   Plan:   Follow up in one week.  

## 2018-06-07 ENCOUNTER — Other Ambulatory Visit: Payer: Self-pay

## 2018-06-07 ENCOUNTER — Encounter (INDEPENDENT_AMBULATORY_CARE_PROVIDER_SITE_OTHER): Payer: Self-pay | Admitting: Nurse Practitioner

## 2018-06-07 ENCOUNTER — Ambulatory Visit (INDEPENDENT_AMBULATORY_CARE_PROVIDER_SITE_OTHER): Payer: Medicaid Other | Admitting: Nurse Practitioner

## 2018-06-07 VITALS — BP 166/99 | HR 108 | Resp 14 | Ht 69.0 in | Wt 332.0 lb

## 2018-06-07 DIAGNOSIS — E785 Hyperlipidemia, unspecified: Secondary | ICD-10-CM

## 2018-06-07 DIAGNOSIS — I890 Lymphedema, not elsewhere classified: Secondary | ICD-10-CM

## 2018-06-07 DIAGNOSIS — Z872 Personal history of diseases of the skin and subcutaneous tissue: Secondary | ICD-10-CM

## 2018-06-07 DIAGNOSIS — Z79899 Other long term (current) drug therapy: Secondary | ICD-10-CM

## 2018-06-07 DIAGNOSIS — L03116 Cellulitis of left lower limb: Secondary | ICD-10-CM

## 2018-06-07 NOTE — Progress Notes (Signed)
SUBJECTIVE:  Patient ID: [name], male    DOB: [birth year], 53 y.o.   MRN: [id number] Chief Complaint  Patient presents with  . Follow-up    HPI  [name] is a 53 y.o. male that presents today for evaluation of lower extremities following 4 weeks of Unna wraps.  Today the patient's previous cellulitis appears resolved.  The wounds that the patient had on his right lower extremity have resolved.  The patient states that they feel much better.  The swelling is also greatly reduced.  The patient denies any fever, chills, nausea, vomiting or diarrhea.  The patient has his lymphedema pump which he states is a positive factor in his care and he continues to use it 3 times a day for at least an hour each time.  Past Medical History:  Diagnosis Date  . COPD (chronic obstructive pulmonary disease) ([place])   . Coronary artery disease   . Diabetes mellitus without complication ([place])   . Hypertension   . Peptic ulcer   . Sleep apnea     Past Surgical History:  Procedure Laterality Date  . COLONOSCOPY WITH PROPOFOL N/A 11/09/2016   Procedure: COLONOSCOPY WITH PROPOFOL;  Surgeon: Lollie Sails, MD;  Location: [facility] ENDOSCOPY;  Service: Endoscopy;  Laterality: N/A;  . COLONOSCOPY WITH PROPOFOL N/A 07/06/2017   Procedure: COLONOSCOPY WITH PROPOFOL;  Surgeon: Lollie Sails, MD;  Location: [facility] ENDOSCOPY;  Service: Endoscopy;  Laterality: N/A;  . ESOPHAGOGASTRODUODENOSCOPY (EGD) WITH PROPOFOL N/A 11/09/2016   Procedure: ESOPHAGOGASTRODUODENOSCOPY (EGD) WITH PROPOFOL;  Surgeon: Lollie Sails, MD;  Location: [facility] ENDOSCOPY;  Service: Endoscopy;  Laterality: N/A;  . ESOPHAGOGASTRODUODENOSCOPY (EGD) WITH PROPOFOL N/A 07/06/2017   Procedure: ESOPHAGOGASTRODUODENOSCOPY (EGD) WITH PROPOFOL;  Surgeon: Lollie Sails, MD;  Location: [facility] ENDOSCOPY;  Service: Endoscopy;  Laterality: N/A;    Social History   Socioeconomic History  . Marital status: Single    Spouse name: Not  on file  . Number of children: Not on file  . Years of education: Not on file  . Highest education level: Not on file  Occupational History  . Not on file  Social Needs  . Financial resource strain: Not on file  . Food insecurity:    Worry: Not on file    Inability: Not on file  . Transportation needs:    Medical: Not on file    Non-medical: Not on file  Tobacco Use  . Smoking status: Current Every Day Smoker    Packs/day: 1.00    Years: 30.00    Pack years: 30.00    Types: Cigarettes  . Smokeless tobacco: Never Used  Substance and Sexual Activity  . Alcohol use: No  . Drug use: No  . Sexual activity: Never  Lifestyle  . Physical activity:    Days per week: Not on file    Minutes per session: Not on file  . Stress: Not on file  Relationships  . Social connections:    Talks on phone: Not on file    Gets together: Not on file    Attends religious service: Not on file    Active member of club or organization: Not on file    Attends meetings of clubs or organizations: Not on file    Relationship status: Not on file  . Intimate partner violence:    Fear of current or ex partner: Not on file    Emotionally abused: Not on file    Physically abused: Not on file  Forced sexual activity: Not on file  Other Topics Concern  . Not on file  Social History Narrative  . Not on file    Family History  Problem Relation Age of Onset  . Diabetes Mother   . Diabetes Father   . Stomach cancer Father     Allergies  Allergen Reactions  . Shellfish Allergy Swelling  . Codeine Rash  . Lisinopril Cough     Review of Systems   Review of Systems: Negative Unless Checked Constitutional: [] Weight loss  [] Fever  [] Chills Cardiac: [] Chest pain   []  Atrial Fibrillation  [] Palpitations   [] Shortness of breath when laying flat   [] Shortness of breath with exertion. [] Shortness of breath at rest Vascular:  [] Pain in legs with walking   [] Pain in legs with standing [] Pain in legs  when laying flat   [] Claudication    [] Pain in feet when laying flat    [] History of DVT   [] Phlebitis   [x] Swelling in legs   [x] Varicose veins   [] Non-healing ulcers Pulmonary:   [] Uses home oxygen   [] Productive cough   [] Hemoptysis   [] Wheeze  [] COPD   [] Asthma Neurologic:  [] Dizziness   [] Seizures  [] Blackouts [] History of stroke   [] History of TIA  [] Aphasia   [] Temporary Blindness   [] Weakness or numbness in arm   [] Weakness or numbness in leg Musculoskeletal:   [] Joint swelling   [] Joint pain   [] Low back pain  []  History of Knee Replacement [x] Arthritis [] back Surgeries  []  Spinal Stenosis    Hematologic:  [] Easy bruising  [] Easy bleeding   [] Hypercoagulable state   [] Anemic Gastrointestinal:  [] Diarrhea   [] Vomiting  [] Gastroesophageal reflux/heartburn   [] Difficulty swallowing. [] Abdominal pain Genitourinary:  [] Chronic kidney disease   [] Difficult urination  [] Anuric   [] Blood in urine [] Frequent urination  [] Burning with urination   [] Hematuria Skin:  [] Rashes   [] Ulcers [] Wounds Psychological:  [] History of anxiety   []  History of major depression  []  Memory Difficulties      OBJECTIVE:   Physical Exam  BP (!) 166/99 (BP Location: Left Wrist, Patient Position: Sitting, Cuff Size: Large)   Pulse (!) 108   Resp 14   Ht 5\' 9"  (1.753 m)   Wt (!) 332 lb (150.6 kg)   BMI 49.03 kg/m   Gen: WD/WN, NAD Head: [place]/AT, No temporalis wasting.  Ear/Nose/Throat: Hearing grossly intact, nares w/o erythema or drainage Eyes: PER, EOMI, sclera nonicteric.  Neck: Supple, no masses.  No JVD.  Pulmonary:  Good air movement, no use of accessory muscles.  Cardiac: RRR Vascular:  2+ heart edema bilaterally Vessel Right Left  Radial Palpable Palpable  Posterior Tibial Palpable Palpable   Gastrointestinal: soft, non-distended. No guarding/no peritoneal signs.  Musculoskeletal: M/S 5/5 throughout.  No deformity or atrophy.  Neurologic: Pain and light touch intact in extremities.  Symmetrical.   Speech is fluent. Motor exam as listed above. Psychiatric: Judgment intact, Mood & affect appropriate for pt's clinical situation. Dermatologic:  Stasis dermatitis bilaterally. No Ulcers Noted.  No changes consistent with cellulitis. Lymph : No Cervical lymphadenopathy, lichenification bilaterally     ASSESSMENT AND PLAN:  1. Hyperlipidemia, unspecified hyperlipidemia type Continue statin as ordered and reviewed, no changes at this time   2. Cellulitis of left lower extremity This has resolved no further treatment necessary.  3. Lymphedema Wounds on patient's lower extremities have resolved.  Patient is instructed to continue with medical grade 1 compression therapy using his compression socks on a daily  basis, elevation, exercise as well as use of his lymphedema pump.  Per the patient he regularly uses it 3 times a day for an hour.  This is urged to continue.  We will see the patient in 3 months to ensure that there is no further swelling or ulceration.   Current Outpatient Medications on File Prior to Visit  Medication Sig Dispense Refill  . albuterol (VENTOLIN HFA) 108 (90 Base) MCG/ACT inhaler Inhale 2 puffs into the lungs every 6 (six) hours as needed for wheezing or shortness of breath.    Marland Kitchen aspirin-acetaminophen-caffeine (EXCEDRIN MIGRAINE) 250-250-65 MG tablet Take by mouth every 6 (six) hours as needed for headache.    Marland Kitchen atorvastatin (LIPITOR) 20 MG tablet Take 20 mg by mouth at bedtime.     Marland Kitchen buPROPion (WELLBUTRIN SR) 150 MG 12 hr tablet Take 150 mg by mouth 2 (two) times daily.    . cholecalciferol (VITAMIN D3) 25 MCG (1000 UT) tablet Take 1 tablet (1,000 Units total) by mouth daily. 30 tablet 5  . doxycycline (VIBRAMYCIN) 100 MG capsule Take 1 capsule (100 mg total) by mouth 2 (two) times daily. 28 capsule 0  . Fluticasone-Salmeterol (ADVAIR DISKUS) 100-50 MCG/DOSE AEPB Inhale 1 puff into the lungs 2 (two) times daily.     . furosemide (LASIX) 40 MG tablet Take 1 tablet (40 mg  total) by mouth daily. 30 tablet 0  . hydrochlorothiazide (MICROZIDE) 12.5 MG capsule Take 12.5 mg by mouth daily.   0  . loratadine (CLARITIN) 10 MG tablet Take 10 mg by mouth daily.    Marland Kitchen losartan (COZAAR) 50 MG tablet Take 1 tablet (50 mg total) by mouth daily. 30 tablet 0  . Magnesium 500 MG CAPS Take 1 capsule (500 mg total) by mouth 2 (two) times daily at 8 am and 10 pm. 60 capsule 5  . meloxicam (MOBIC) 15 MG tablet Take 1 tablet (15 mg total) by mouth daily. 30 tablet 2  . metFORMIN (GLUCOPHAGE) 1000 MG tablet Take 1,000 mg by mouth 2 (two) times daily with a meal.    . oxybutynin (DITROPAN) 5 MG tablet Take 5 mg by mouth every 12 (twelve) hours.    . pantoprazole (PROTONIX) 40 MG tablet Take 40 mg by mouth 2 (two) times daily.     Marland Kitchen tiZANidine (ZANAFLEX) 4 MG tablet Take 1 tablet (4 mg total) by mouth every 8 (eight) hours as needed for muscle spasms. 90 tablet 2  . traMADol (ULTRAM) 50 MG tablet Take 1 tablet (50 mg total) by mouth every 6 (six) hours as needed. 20 tablet 0  . calcium carbonate (CALCIUM 600) 600 MG TABS tablet Take 1 tablet (600 mg total) by mouth 2 (two) times daily with a meal for 30 days. 60 tablet 0   No current facility-administered medications on file prior to visit.     There are no Patient Instructions on file for this visit. No follow-ups on file.   Kris Hartmann, NP  This note was completed with Sales executive.  Any errors are purely unintentional.

## 2018-06-14 ENCOUNTER — Ambulatory Visit (INDEPENDENT_AMBULATORY_CARE_PROVIDER_SITE_OTHER): Payer: Medicaid Other | Admitting: Nurse Practitioner

## 2018-06-17 ENCOUNTER — Other Ambulatory Visit: Payer: Self-pay

## 2018-06-17 ENCOUNTER — Ambulatory Visit (INDEPENDENT_AMBULATORY_CARE_PROVIDER_SITE_OTHER): Payer: Medicare Other | Admitting: Vascular Surgery

## 2018-06-17 ENCOUNTER — Encounter (INDEPENDENT_AMBULATORY_CARE_PROVIDER_SITE_OTHER): Payer: Self-pay | Admitting: Vascular Surgery

## 2018-06-17 ENCOUNTER — Other Ambulatory Visit: Admission: RE | Admit: 2018-06-17 | Payer: Medicare Other | Source: Ambulatory Visit

## 2018-06-17 VITALS — BP 183/91 | HR 114 | Resp 16 | Ht 69.0 in | Wt 336.0 lb

## 2018-06-17 DIAGNOSIS — L97221 Non-pressure chronic ulcer of left calf limited to breakdown of skin: Secondary | ICD-10-CM

## 2018-06-17 DIAGNOSIS — I10 Essential (primary) hypertension: Secondary | ICD-10-CM

## 2018-06-17 DIAGNOSIS — E785 Hyperlipidemia, unspecified: Secondary | ICD-10-CM | POA: Diagnosis not present

## 2018-06-17 DIAGNOSIS — E119 Type 2 diabetes mellitus without complications: Secondary | ICD-10-CM

## 2018-06-17 DIAGNOSIS — Z7984 Long term (current) use of oral hypoglycemic drugs: Secondary | ICD-10-CM

## 2018-06-17 DIAGNOSIS — Z6841 Body Mass Index (BMI) 40.0 and over, adult: Secondary | ICD-10-CM

## 2018-06-17 DIAGNOSIS — R600 Localized edema: Secondary | ICD-10-CM

## 2018-06-17 DIAGNOSIS — Z79899 Other long term (current) drug therapy: Secondary | ICD-10-CM

## 2018-06-17 DIAGNOSIS — L03116 Cellulitis of left lower limb: Secondary | ICD-10-CM

## 2018-06-17 DIAGNOSIS — M7989 Other specified soft tissue disorders: Secondary | ICD-10-CM

## 2018-06-17 DIAGNOSIS — I890 Lymphedema, not elsewhere classified: Secondary | ICD-10-CM

## 2018-06-17 NOTE — Patient Instructions (Signed)

## 2018-06-17 NOTE — Progress Notes (Signed)
MRN : [id number]  [name] is a 53 y.o. (27-Dec-1965) male who presents with chief complaint of  Chief Complaint  Patient presents with  . Follow-up  .  History of Present Illness: Patient returns today in follow up of worsening leg swelling, skin breakdown, and erythema of the left leg.  It is more painful.  He reports he is only been out of Unna boots about 2 weeks and this has flared right back up.  Conservative measures have not been able to keep him under control over the past several months.  Current Outpatient Medications  Medication Sig Dispense Refill  . albuterol (VENTOLIN HFA) 108 (90 Base) MCG/ACT inhaler Inhale 2 puffs into the lungs every 6 (six) hours as needed for wheezing or shortness of breath.    Marland Kitchen aspirin-acetaminophen-caffeine (EXCEDRIN MIGRAINE) 250-250-65 MG tablet Take by mouth every 6 (six) hours as needed for headache.    Marland Kitchen atorvastatin (LIPITOR) 20 MG tablet Take 20 mg by mouth at bedtime.     Marland Kitchen buPROPion (WELLBUTRIN SR) 150 MG 12 hr tablet Take 150 mg by mouth 2 (two) times daily.    . calcium carbonate (CALCIUM 600) 600 MG TABS tablet Take 1 tablet (600 mg total) by mouth 2 (two) times daily with a meal for 30 days. 60 tablet 0  . cholecalciferol (VITAMIN D3) 25 MCG (1000 UT) tablet Take 1 tablet (1,000 Units total) by mouth daily. 30 tablet 5  . doxycycline (VIBRAMYCIN) 100 MG capsule Take 1 capsule (100 mg total) by mouth 2 (two) times daily. 28 capsule 0  . Fluticasone-Salmeterol (ADVAIR DISKUS) 100-50 MCG/DOSE AEPB Inhale 1 puff into the lungs 2 (two) times daily.     . furosemide (LASIX) 40 MG tablet Take 1 tablet (40 mg total) by mouth daily. 30 tablet 0  . hydrochlorothiazide (MICROZIDE) 12.5 MG capsule Take 12.5 mg by mouth daily.   0  . loratadine (CLARITIN) 10 MG tablet Take 10 mg by mouth daily.    Marland Kitchen losartan (COZAAR) 50 MG tablet Take 1 tablet (50 mg total) by mouth daily. 30 tablet 0  . Magnesium 500 MG CAPS Take 1 capsule (500 mg total) by  mouth 2 (two) times daily at 8 am and 10 pm. 60 capsule 5  . meloxicam (MOBIC) 15 MG tablet Take 1 tablet (15 mg total) by mouth daily. 30 tablet 2  . metFORMIN (GLUCOPHAGE) 1000 MG tablet Take 1,000 mg by mouth 2 (two) times daily with a meal.    . oxybutynin (DITROPAN) 5 MG tablet Take 5 mg by mouth every 12 (twelve) hours.    . pantoprazole (PROTONIX) 40 MG tablet Take 40 mg by mouth 2 (two) times daily.     Marland Kitchen tiZANidine (ZANAFLEX) 4 MG tablet Take 1 tablet (4 mg total) by mouth every 8 (eight) hours as needed for muscle spasms. 90 tablet 2  . traMADol (ULTRAM) 50 MG tablet Take 1 tablet (50 mg total) by mouth every 6 (six) hours as needed. 20 tablet 0   No current facility-administered medications for this visit.     Past Medical History:  Diagnosis Date  . COPD (chronic obstructive pulmonary disease) ([place])   . Coronary artery disease   . Diabetes mellitus without complication ([place])   . Hypertension   . Peptic ulcer   . Sleep apnea     Past Surgical History:  Procedure Laterality Date  . COLONOSCOPY WITH PROPOFOL N/A 11/09/2016   Procedure: COLONOSCOPY WITH PROPOFOL;  Surgeon: Gustavo Lah,  Billie [name], MD;  Location: [facility] ENDOSCOPY;  Service: Endoscopy;  Laterality: N/A;  . COLONOSCOPY WITH PROPOFOL N/A 07/06/2017   Procedure: COLONOSCOPY WITH PROPOFOL;  Surgeon: Lollie Sails, MD;  Location[facility] ENDOSCOPY;  Service: Endoscopy;  Laterality: N/A;  . ESOPHAGOGASTRODUODENOSCOPY (EGD) WITH PROPOFOL N/A 11/09/2016   Procedure: ESOPHAGOGASTRODUODENOSCOPY (EGD) WITH PROPOFOL;  Surgeon: Lollie Sails, MD;  Location: [facility] ENDOSCOPY;  Service: Endoscopy;  Laterality: N/A;  . ESOPHAGOGASTRODUODENOSCOPY (EGD) WITH PROPOFOL N/A 07/06/2017   Procedure: ESOPHAGOGASTRODUODENOSCOPY (EGD) WITH PROPOFOL;  Surgeon: Lollie Sails, MD;  Location: [facility] ENDOSCOPY;  Service: Endoscopy;  Laterality: N/A;    Family History  Problem Relation Age of Onset  . Diabetes Mother   . Diabetes Father    . Stomach cancer Father   No bleeding or clotting disorders       Social History  Substance Use Topics  . Smoking status: Current Every Day Smoker    Packs/day: 1.00    Years: 30.00    Types: Cigarettes  . Smokeless tobacco: Never Used  . Alcohol use No  No IVDU      Allergies  Allergen Reactions  . Shellfish Allergy Swelling  . Codeine Rash  . Lisinopril Cough    No current facility-administered medications for this visit.       REVIEW OF SYSTEMS(Negative unless checked)  Constitutional: [] ???Weight loss[] ???Fever[] ???Chills Cardiac:[] ???Chest pain[] ???Chest pressure[] ???Palpitations [] ???Shortness of breath when laying flat [] ???Shortness of breath at rest [x] ???Shortness of breath with exertion. Vascular: [] ???Pain in legs with walking[x] ???Pain in legsat rest[] ???Pain in legs when laying flat [] ???Claudication [] ???Pain in feet when walking [] ???Pain in feet at rest [] ???Pain in feet when laying flat [] ???History of DVT [] ???Phlebitis [x] ???Swelling in legs [] ???Varicose veins [x] ???Non-healing ulcers Pulmonary: [] ???Uses home oxygen [] ???Productive cough[] ???Hemoptysis [] ???Wheeze [x] ???COPD [] ???Asthma Neurologic: [] ???Dizziness [] ???Blackouts [] ???Seizures [] ???History of stroke [] ???History of TIA[] ???Aphasia [] ???Temporary blindness[] ???Dysphagia [] ???Weaknessor numbness in arms [] ???Weakness or numbnessin legs Musculoskeletal: [] ???Arthritis [] ???Joint swelling [x] ???Joint pain [] ???Low back pain Hematologic:[] ???Easy bruising[] ???Easy bleeding [] ???Hypercoagulable state [] ???Anemic [] ???Hepatitis Gastrointestinal:[] ???Blood in stool[] ???Vomiting blood[] ???Gastroesophageal reflux/heartburn[] ???Abdominal pain Genitourinary: [] ???Chronic kidney disease [] ???Difficulturination [] ???Frequenturination [] ???Burning with urination[] ???Hematuria  Skin: [] ???Rashes [x] ???Ulcers [x] ???Wounds Psychological: [] ???History of anxiety[] ???History of major depression.    Physical Examination  BP (!) 183/91 (BP Location: Left Arm, Patient Position: Sitting, Cuff Size: Large)   Pulse (!) 114   Resp 16   Ht 5\' 9"  (1.753 m)   Wt (!) 336 lb (152.4 kg)   BMI 49.62 kg/m  Gen:  WD/WN, NAD.  Obese Head: [place]/AT, No temporalis wasting. Ear/Nose/Throat: Hearing grossly intact, nares w/o erythema or drainage Eyes: Conjunctiva clear. Sclera non-icteric Neck: Supple.  Trachea midline Pulmonary:  Good air movement, no use of accessory muscles.  Cardiac: RRR, no JVD Vascular:  Vessel Right Left  Radial Palpable Palpable                   Musculoskeletal: M/S 5/5 throughout.  No deformity or atrophy.  1-2+ right lower extremity edema, 3+ left lower extremity edema.  Several scabs and superficial ulcerations on the left anterior and posterior calf and lower leg.  Moderate erythema in the left leg that goes up into the distal thigh Neurologic: Sensation grossly intact in extremities.  Symmetrical.  Speech is fluent.  Psychiatric: Judgment intact, Mood & affect appropriate for pt's clinical situation. Dermatologic: Left calf wounds as above.       Labs Recent Results (from the past 2160 hour(s))  Basic metabolic panel     Status: Abnormal   Collection Time: 05/19/18  4:13 PM  Result Value Ref Range   Sodium 139 135 - 145 mmol/L   Potassium 3.8 3.5 - 5.1 mmol/L   Chloride 102 98 - 111 mmol/L   CO2 24 22 - 32 mmol/L   Glucose, Bld 110 (H) 70 - 99 mg/dL   BUN 11 6 - 20 mg/dL   Creatinine, Ser 0.87 0.61 - 1.24 mg/dL   Calcium 8.8 (L) 8.9 - 10.3 mg/dL   GFR calc non Af Amer >60 >60 mL/min   GFR calc Af Amer >60 >60 mL/min   Anion gap 13 5 - 15    Comment: Performed at [facility], [address] [place], [place] [address]  CBC     Status: None   Collection Time: 05/19/18  4:13 PM  Result Value Ref Range   WBC  7.9 4.0 - 10.5 K/uL   RBC 4.88 4.22 - 5.81 MIL/uL   Hemoglobin 13.9 13.0 - 17.0 g/dL   HCT 42.6 39.0 - 52.0 %   MCV 87.3 80.0 - 100.0 fL   MCH 28.5 26.0 - 34.0 pg   MCHC 32.6 30.0 - 36.0 g/dL   RDW 13.6 11.5 - 15.5 %   Platelets 221 150 - 400 K/uL   nRBC 0.0 0.0 - 0.2 %    Comment: Performed at [facility], [address] [place], [place] [address]  Troponin I - ONCE - STAT     Status: Abnormal   Collection Time: 05/19/18  4:13 PM  Result Value Ref Range   Troponin I 0.04 (HH) <0.03 ng/mL    Comment: CRITICAL RESULT CALLED TO, READ BACK BY AND VERIFIED WITH RACHEL HAYDEN AT 1648 05/19/2018.  TFK Performed at [facility], [address] [place], [place] [address]   Troponin I - ONCE - STAT     Status: Abnormal   Collection Time: 05/19/18  8:14 PM  Result Value Ref Range   Troponin I 0.05 (HH) <0.03 ng/mL    Comment: CRITICAL VALUE NOTED. VALUE IS CONSISTENT WITH PREVIOUSLY REPORTED/CALLED VALUE.  TFK Performed at [facility], [address] [place], [place] [address]     Radiology Dg Foot Complete Right  Result Date: 05/19/2018 CLINICAL DATA:  Right foot injury after fall today. EXAM: RIGHT FOOT COMPLETE - 3+ VIEW COMPARISON:  None. FINDINGS: There is no evidence of fracture or dislocation. Joint spaces are intact. Moderate posterior calcaneal spurring is noted. Dressing is seen involving the foot and ankle. IMPRESSION: No acute abnormality seen in the right foot Electronically Signed   By: Marijo Conception M.D.   On: 05/19/2018 17:42    Assessment/Plan Obesity Discussed that weight loss would be very beneficial for wound healing and leg swelling.  Essential hypertension, benign blood pressure control important in reducing the progression of atherosclerotic disease. On appropriate oral medications.   Hyperlipidemia lipid control important in reducing the progression of atherosclerotic disease. Continue statin therapy  DM2 (diabetes  mellitus, type 2) (HCC) blood glucose control important in reducing the progression of atherosclerotic disease. Also, involved in wound healing. On appropriate medications.  Lower limb ulcer, calf, left, limited to breakdown of skin [facility]) Patient has a new ulceration of the left leg and some cellulitis bilaterally.  As such, a 3 layer Unna boot was placed on the left leg again today.  This will be changed weekly.  We will reassess this in 3 weeks or so.  Lymphedema Symptoms have deteriorated.  He will go back into [employer] today on the  left leg.  Cellulitis I have given him a prescription for 10 days worth of Augmentin today. Keflex did not work last time.   This will be in addition to [employer].  We will be doing weekly Unna boot changes and we will reassess him in a few weeks.    Leotis Pain, MD  06/17/2018 12:10 PM    This note was created with Dragon medical transcription system.  Any errors from dictation are purely unintentional

## 2018-06-20 ENCOUNTER — Telehealth: Payer: Self-pay | Admitting: Pain Medicine

## 2018-06-20 NOTE — Telephone Encounter (Signed)
I do not see that a covid test has been done on this patient.  Per policy, a negative covid test is required to have a procedure.

## 2018-06-20 NOTE — Telephone Encounter (Signed)
One of the nurses from the [facility] testing sites called and left a voicemail letting us know this pt did not show up for their covid testing. Should I just cancel his procedure appt?

## 2018-06-21 ENCOUNTER — Ambulatory Visit: Payer: Medicaid Other | Admitting: Pain Medicine

## 2018-06-24 ENCOUNTER — Other Ambulatory Visit: Payer: Self-pay

## 2018-06-24 ENCOUNTER — Ambulatory Visit (INDEPENDENT_AMBULATORY_CARE_PROVIDER_SITE_OTHER): Payer: Medicare Other | Admitting: Nurse Practitioner

## 2018-06-24 VITALS — BP 168/92 | HR 108 | Resp 14 | Ht 69.0 in | Wt 329.0 lb

## 2018-06-24 DIAGNOSIS — L97221 Non-pressure chronic ulcer of left calf limited to breakdown of skin: Secondary | ICD-10-CM

## 2018-06-24 DIAGNOSIS — L97919 Non-pressure chronic ulcer of unspecified part of right lower leg with unspecified severity: Secondary | ICD-10-CM

## 2018-06-24 NOTE — Progress Notes (Signed)
History of Present Illness  There is no documented history at this time  Assessments & Plan   There are no diagnoses linked to this encounter.    Additional instructions  Subjective:  Patient presents with venous ulcer of the Bilateral lower extremity.    Procedure:  3 layer unna wrap was placed Bilateral lower extremity.   Plan:   Follow up in one week.  

## 2018-06-27 ENCOUNTER — Emergency Department: Payer: Medicare Other

## 2018-06-27 ENCOUNTER — Other Ambulatory Visit: Payer: Self-pay

## 2018-06-27 ENCOUNTER — Inpatient Hospital Stay
Admission: EM | Admit: 2018-06-27 | Discharge: 2018-06-30 | DRG: 603 | Disposition: A | Discharge status: Home / Self Care | Payer: Medicare Other | Attending: Internal Medicine | Admitting: Internal Medicine

## 2018-06-27 ENCOUNTER — Encounter (INDEPENDENT_AMBULATORY_CARE_PROVIDER_SITE_OTHER): Payer: Self-pay | Admitting: Nurse Practitioner

## 2018-06-27 ENCOUNTER — Encounter: Payer: Self-pay | Admitting: Emergency Medicine

## 2018-06-27 ENCOUNTER — Ambulatory Visit (INDEPENDENT_AMBULATORY_CARE_PROVIDER_SITE_OTHER): Payer: Medicare Other | Admitting: Nurse Practitioner

## 2018-06-27 VITALS — BP 136/92 | HR 101 | Resp 14 | Ht 69.0 in | Wt 329.0 lb

## 2018-06-27 DIAGNOSIS — Z7951 Long term (current) use of inhaled steroids: Secondary | ICD-10-CM | POA: Diagnosis not present

## 2018-06-27 DIAGNOSIS — Z833 Family history of diabetes mellitus: Secondary | ICD-10-CM | POA: Diagnosis not present

## 2018-06-27 DIAGNOSIS — L97221 Non-pressure chronic ulcer of left calf limited to breakdown of skin: Secondary | ICD-10-CM

## 2018-06-27 DIAGNOSIS — I10 Essential (primary) hypertension: Secondary | ICD-10-CM

## 2018-06-27 DIAGNOSIS — Z1159 Encounter for screening for other viral diseases: Secondary | ICD-10-CM

## 2018-06-27 DIAGNOSIS — F17210 Nicotine dependence, cigarettes, uncomplicated: Secondary | ICD-10-CM

## 2018-06-27 DIAGNOSIS — G4730 Sleep apnea, unspecified: Secondary | ICD-10-CM | POA: Diagnosis present

## 2018-06-27 DIAGNOSIS — Z7984 Long term (current) use of oral hypoglycemic drugs: Secondary | ICD-10-CM | POA: Diagnosis not present

## 2018-06-27 DIAGNOSIS — L97229 Non-pressure chronic ulcer of left calf with unspecified severity: Secondary | ICD-10-CM | POA: Diagnosis not present

## 2018-06-27 DIAGNOSIS — J449 Chronic obstructive pulmonary disease, unspecified: Secondary | ICD-10-CM | POA: Diagnosis present

## 2018-06-27 DIAGNOSIS — I890 Lymphedema, not elsewhere classified: Secondary | ICD-10-CM | POA: Diagnosis present

## 2018-06-27 DIAGNOSIS — Z6841 Body Mass Index (BMI) 40.0 and over, adult: Secondary | ICD-10-CM

## 2018-06-27 DIAGNOSIS — N319 Neuromuscular dysfunction of bladder, unspecified: Secondary | ICD-10-CM | POA: Diagnosis present

## 2018-06-27 DIAGNOSIS — L03116 Cellulitis of left lower limb: Secondary | ICD-10-CM | POA: Diagnosis present

## 2018-06-27 DIAGNOSIS — E119 Type 2 diabetes mellitus without complications: Secondary | ICD-10-CM | POA: Diagnosis present

## 2018-06-27 DIAGNOSIS — Z8711 Personal history of peptic ulcer disease: Secondary | ICD-10-CM

## 2018-06-27 DIAGNOSIS — L308 Other specified dermatitis: Secondary | ICD-10-CM | POA: Diagnosis present

## 2018-06-27 DIAGNOSIS — Z792 Long term (current) use of antibiotics: Secondary | ICD-10-CM | POA: Diagnosis not present

## 2018-06-27 DIAGNOSIS — G894 Chronic pain syndrome: Secondary | ICD-10-CM | POA: Diagnosis present

## 2018-06-27 DIAGNOSIS — Z7982 Long term (current) use of aspirin: Secondary | ICD-10-CM

## 2018-06-27 DIAGNOSIS — I2510 Atherosclerotic heart disease of native coronary artery without angina pectoris: Secondary | ICD-10-CM | POA: Diagnosis present

## 2018-06-27 DIAGNOSIS — Z9119 Patient's noncompliance with other medical treatment and regimen: Secondary | ICD-10-CM

## 2018-06-27 DIAGNOSIS — Z79899 Other long term (current) drug therapy: Secondary | ICD-10-CM | POA: Diagnosis not present

## 2018-06-27 DIAGNOSIS — Z716 Tobacco abuse counseling: Secondary | ICD-10-CM

## 2018-06-27 DIAGNOSIS — L03115 Cellulitis of right lower limb: Secondary | ICD-10-CM | POA: Diagnosis present

## 2018-06-27 DIAGNOSIS — Z91013 Allergy to seafood: Secondary | ICD-10-CM

## 2018-06-27 DIAGNOSIS — E785 Hyperlipidemia, unspecified: Secondary | ICD-10-CM | POA: Diagnosis present

## 2018-06-27 LAB — BASIC METABOLIC PANEL
Anion gap: 12 (ref 5–15)
BUN: 10 mg/dL (ref 6–20)
CO2: 27 mmol/L (ref 22–32)
Calcium: 9.6 mg/dL (ref 8.9–10.3)
Chloride: 101 mmol/L (ref 98–111)
Creatinine, Ser: 0.99 mg/dL (ref 0.61–1.24)
GFR calc Af Amer: >60 mL/min (ref >60)
GFR calc non Af Amer: >60 mL/min (ref >60)
Glucose, Bld: 94 mg/dL (ref 70–99)
Potassium: 4.4 mmol/L (ref 3.5–5.1)
Sodium: 140 mmol/L (ref 135–145)

## 2018-06-27 LAB — CBC WITH DIFFERENTIAL/PLATELET
Abs Immature Granulocytes: 0.06 10*3/uL (ref 0.00–0.07)
Basophils Absolute: 0.1 10*3/uL (ref 0.0–0.1)
Basophils Relative: 1 %
Eosinophils Absolute: 0.3 10*3/uL (ref 0.0–0.5)
Eosinophils Relative: 3 %
HCT: 47.7 % (ref 39.0–52.0)
Hemoglobin: 15.3 g/dL (ref 13.0–17.0)
Immature Granulocytes: 1 %
Lymphocytes Relative: 14 %
Lymphs Abs: 1.2 10*3/uL (ref 0.7–4.0)
MCH: 27.7 pg (ref 26.0–34.0)
MCHC: 32.1 g/dL (ref 30.0–36.0)
MCV: 86.4 fL (ref 80.0–100.0)
Monocytes Absolute: 0.6 10*3/uL (ref 0.1–1.0)
Monocytes Relative: 7 %
Neutro Abs: 6.3 10*3/uL (ref 1.7–7.7)
Neutrophils Relative %: 74 %
Platelets: 312 10*3/uL (ref 150–400)
RBC: 5.52 MIL/uL (ref 4.22–5.81)
RDW: 14.1 % (ref 11.5–15.5)
WBC: 8.5 10*3/uL (ref 4.0–10.5)
nRBC: 0 % (ref 0.0–0.2)

## 2018-06-27 LAB — LACTIC ACID, PLASMA: Lactic Acid, Venous: 2.7 mmol/L (ref 0.5–1.9)

## 2018-06-27 LAB — HEMOGLOBIN A1C
Hgb A1c MFr Bld: 6.1 % — ABNORMAL HIGH (ref 4.8–5.6)
Mean Plasma Glucose: 128.37 mg/dL

## 2018-06-27 LAB — GLUCOSE, CAPILLARY
Glucose-Capillary: 74 mg/dL (ref 70–99)
Glucose-Capillary: 127 mg/dL — ABNORMAL HIGH (ref 70–99)

## 2018-06-27 LAB — CK: Total CK: 239 U/L (ref 49–397)

## 2018-06-27 MED ORDER — LORATADINE 10 MG PO TABS
10.0000 mg | ORAL_TABLET | Freq: Every day | ORAL | Status: DC
  Administered 2018-06-27 – 2018-06-30 (×4): 10 mg via ORAL
  Filled 2018-06-27 (×5): qty 1

## 2018-06-27 MED ORDER — MELOXICAM 7.5 MG PO TABS
15.0000 mg | ORAL_TABLET | Freq: Every day | ORAL | Status: DC
  Administered 2018-06-27 – 2018-06-30 (×4): 15 mg via ORAL
  Filled 2018-06-27 (×4): qty 2

## 2018-06-27 MED ORDER — MOMETASONE FURO-FORMOTEROL FUM 100-5 MCG/ACT IN AERO
2.0000 | INHALATION_SPRAY | Freq: Two times a day (BID) | RESPIRATORY_TRACT | Status: DC
  Filled 2018-06-27: qty 8.8

## 2018-06-27 MED ORDER — HYDROCODONE-ACETAMINOPHEN 7.5-325 MG PO TABS
1.0000 | ORAL_TABLET | Freq: Four times a day (QID) | ORAL | Status: DC | PRN
  Administered 2018-06-27 – 2018-06-29 (×4): 1 via ORAL
  Filled 2018-06-27 (×4): qty 1

## 2018-06-27 MED ORDER — MORPHINE SULFATE (PF) 4 MG/ML IV SOLN
6.0000 mg | Freq: Once | INTRAVENOUS | Status: AC
  Administered 2018-06-27: 13:00:00 6 mg via INTRAVENOUS
  Filled 2018-06-27: qty 2

## 2018-06-27 MED ORDER — VANCOMYCIN HCL 10 G IV SOLR
2500.0000 mg | Freq: Once | INTRAVENOUS | Status: AC
  Administered 2018-06-27: 13:00:00 2500 mg via INTRAVENOUS
  Filled 2018-06-27: qty 2500

## 2018-06-27 MED ORDER — HYDROCHLOROTHIAZIDE 12.5 MG PO CAPS
12.5000 mg | ORAL_CAPSULE | Freq: Every day | ORAL | Status: DC
  Administered 2018-06-27 – 2018-06-30 (×4): 12.5 mg via ORAL
  Filled 2018-06-27 (×4): qty 1

## 2018-06-27 MED ORDER — ONDANSETRON HCL 4 MG PO TABS: 4.0000 mg | ORAL_TABLET | Freq: Four times a day (QID) | ORAL | Status: DC | PRN

## 2018-06-27 MED ORDER — ENOXAPARIN SODIUM 40 MG/0.4ML ~~LOC~~ SOLN
40.0000 mg | SUBCUTANEOUS | Status: DC
  Administered 2018-06-27: 23:00:00 40 mg via SUBCUTANEOUS
  Filled 2018-06-27: qty 0.4

## 2018-06-27 MED ORDER — SODIUM CHLORIDE 0.9 % IV BOLUS
500.0000 mL | Freq: Once | INTRAVENOUS | Status: AC
  Administered 2018-06-27: 500 mL via INTRAVENOUS

## 2018-06-27 MED ORDER — ACETAMINOPHEN 325 MG PO TABS: 650.0000 mg | ORAL_TABLET | Freq: Four times a day (QID) | ORAL | Status: DC | PRN

## 2018-06-27 MED ORDER — PIPERACILLIN-TAZOBACTAM 3.375 G IVPB 30 MIN
3.3750 g | Freq: Once | INTRAVENOUS | Status: AC
  Administered 2018-06-27: 14:00:00 3.375 g via INTRAVENOUS
  Filled 2018-06-27: qty 50

## 2018-06-27 MED ORDER — TIZANIDINE HCL 4 MG PO TABS
4.0000 mg | ORAL_TABLET | Freq: Three times a day (TID) | ORAL | Status: DC | PRN
  Filled 2018-06-27: qty 1

## 2018-06-27 MED ORDER — ALBUTEROL SULFATE (2.5 MG/3ML) 0.083% IN NEBU: 2.5000 mg | INHALATION_SOLUTION | RESPIRATORY_TRACT | Status: DC | PRN

## 2018-06-27 MED ORDER — PIPERACILLIN-TAZOBACTAM 3.375 G IVPB
3.3750 g | Freq: Three times a day (TID) | INTRAVENOUS | Status: DC
  Administered 2018-06-28 – 2018-06-30 (×8): 3.375 g via INTRAVENOUS
  Filled 2018-06-27 (×8): qty 50

## 2018-06-27 MED ORDER — MAGNESIUM OXIDE 400 (241.3 MG) MG PO TABS
400.0000 mg | ORAL_TABLET | Freq: Two times a day (BID) | ORAL | Status: DC
  Administered 2018-06-27 – 2018-06-29 (×5): 400 mg via ORAL
  Filled 2018-06-27 (×5): qty 1

## 2018-06-27 MED ORDER — OXYBUTYNIN CHLORIDE 5 MG PO TABS
5.0000 mg | ORAL_TABLET | Freq: Two times a day (BID) | ORAL | Status: DC
  Administered 2018-06-27 – 2018-06-30 (×6): 5 mg via ORAL
  Filled 2018-06-27 (×6): qty 1

## 2018-06-27 MED ORDER — PANTOPRAZOLE SODIUM 40 MG PO TBEC
40.0000 mg | DELAYED_RELEASE_TABLET | Freq: Two times a day (BID) | ORAL | Status: DC
  Administered 2018-06-27 – 2018-06-30 (×6): 40 mg via ORAL
  Filled 2018-06-27 (×6): qty 1

## 2018-06-27 MED ORDER — BUPROPION HCL ER (XL) 150 MG PO TB24
300.0000 mg | ORAL_TABLET | Freq: Every day | ORAL | Status: DC
  Administered 2018-06-27 – 2018-06-30 (×4): 300 mg via ORAL
  Filled 2018-06-27 (×4): qty 2

## 2018-06-27 MED ORDER — INSULIN ASPART 100 UNIT/ML ~~LOC~~ SOLN: 0.0000 [IU] | Freq: Every day | SUBCUTANEOUS | Status: DC

## 2018-06-27 MED ORDER — INSULIN ASPART 100 UNIT/ML ~~LOC~~ SOLN
0.0000 [IU] | Freq: Three times a day (TID) | SUBCUTANEOUS | Status: DC
  Administered 2018-06-28: 12:00:00 1 [IU] via SUBCUTANEOUS
  Filled 2018-06-27: qty 1

## 2018-06-27 MED ORDER — POLYETHYLENE GLYCOL 3350 17 G PO PACK: 17.0000 g | PACK | Freq: Every day | ORAL | Status: DC | PRN

## 2018-06-27 MED ORDER — ATORVASTATIN CALCIUM 20 MG PO TABS
20.0000 mg | ORAL_TABLET | Freq: Every day | ORAL | Status: DC
  Administered 2018-06-27 – 2018-06-29 (×3): 20 mg via ORAL
  Filled 2018-06-27 (×3): qty 1

## 2018-06-27 MED ORDER — ALBUTEROL SULFATE HFA 108 (90 BASE) MCG/ACT IN AERS: 2.0000 | INHALATION_SPRAY | RESPIRATORY_TRACT | Status: DC | PRN

## 2018-06-27 MED ORDER — VANCOMYCIN HCL 1.5 G IV SOLR
1500.0000 mg | Freq: Two times a day (BID) | INTRAVENOUS | Status: DC
  Administered 2018-06-27 – 2018-06-28 (×2): 1500 mg via INTRAVENOUS
  Filled 2018-06-27 (×4): qty 1500

## 2018-06-27 MED ORDER — LOSARTAN POTASSIUM 50 MG PO TABS
50.0000 mg | ORAL_TABLET | Freq: Every day | ORAL | Status: DC
  Administered 2018-06-27 – 2018-06-30 (×4): 50 mg via ORAL
  Filled 2018-06-27 (×4): qty 1

## 2018-06-27 MED ORDER — ONDANSETRON HCL 4 MG/2ML IJ SOLN: 4.0000 mg | Freq: Four times a day (QID) | INTRAMUSCULAR | Status: DC | PRN

## 2018-06-27 MED ORDER — HYDRALAZINE HCL 20 MG/ML IJ SOLN: 10.0000 mg | Freq: Four times a day (QID) | INTRAMUSCULAR | Status: DC | PRN

## 2018-06-27 MED ORDER — ACETAMINOPHEN 650 MG RE SUPP: 650.0000 mg | Freq: Four times a day (QID) | RECTAL | Status: DC | PRN

## 2018-06-27 NOTE — ED Notes (Signed)
Pt being transported to rm 208 at this time via stretcher.

## 2018-06-27 NOTE — Discharge Instructions (Addendum)
Vascular Surgery Discharge Instructions: 1) Elevate your legs heart level or higher as much as possible. 2) Use your lymphedema pump at least three times a day for an hour each time with your legs elevated above your heart.

## 2018-06-27 NOTE — Consult Note (Signed)
Pharmacy Antibiotic Note  [name] is a 53 y.o. male admitted on 06/27/2018 with cellulitis.  Pharmacy has been consulted for Vancomycin/Zosyn dosing.  Plan: Zosyn 3.375g IV q8h (4 hour infusion).  Patient received 2500mg  IV Vancomycin in the ED, will follow with:  Vancomycin 1500 mg IV Q 12 hrs. Goal AUC 400-550. Expected AUC: 524 SCr used: 0.99   Height: 5\' 9"  (175.3 cm) Weight: (!) 328 lb (148.8 kg) IBW/kg (Calculated) : 70.7  Temp (24hrs), Avg:98.8 F (37.1 C), Min:98.8 F (37.1 C), Max:98.8 F (37.1 C)  Recent Labs  Lab 06/27/18 1239  WBC 8.5    CrCl cannot be calculated (Patient's most recent lab result is older than the maximum 21 days allowed.).    Allergies  Allergen Reactions  . Shellfish Allergy Swelling  . Codeine Rash  . Lisinopril Cough    Antimicrobials this admission: Zosyn 6/15 >> Vancomycin 6/15 >>  Dose adjustments this admission: None  Microbiology results: 6/15 BCx: pending  Thank you for allowing pharmacy to be a part of this patient's care.  Lu Duffel, PharmD, BCPS Clinical Pharmacist 06/27/2018 3:13 PM

## 2018-06-27 NOTE — Progress Notes (Signed)
Patient sent to ED for evaluation.  NO wrap done. Advised to return if not admitted for wrap.

## 2018-06-27 NOTE — ED Provider Notes (Signed)
[facility] Emergency Department Provider Note   ____________________________________________   First MD Initiated Contact with Patient 06/27/18 1217     (approximate)  I have reviewed the triage vital signs and the nursing notes.   HISTORY  Chief Complaint Wound Infection    HPI STACE PEACE is a 53 y.o. male chronic infection of left foot, worsening over the last few days.  Followed by vascular surgery.  On antibiotic for 1 week with worsening.  Reports it feels increased pain, discomfort redness now up to almost the level of his knee that was not there before.  Sees vascular surgery who cares for him and provides wound wraps.  History of cellulitis in the same area and had to be admitted he reports about December for same.  No loss of feeling in the toes compared to baseline, denies that the left foot feels cold.  There is pain over primarily the back of the left calf region where he reports it feels swollen as well.   Past Medical History:  Diagnosis Date   COPD (chronic obstructive pulmonary disease) ([place])    Coronary artery disease    Diabetes mellitus without complication ([place])    Hypertension    Peptic ulcer    Sleep apnea     Patient Active Problem List   Diagnosis Date Noted   Osteoarthritis of facet joint of lumbar spine 02/14/2018   Osteoarthritis involving multiple joints 02/14/2018   Chronic musculoskeletal pain 02/14/2018   Neurogenic pain 02/14/2018   Abnormal MRI, lumbar spine 02/14/2018   History of allergy to shellfish 01/27/2018   Morbid obesity with BMI of 45.0-49.9, adult ([place]) 12/13/2017   Swelling of limb 11/26/2017   Lower limb ulcer, calf, left, limited to breakdown of skin ([place]) 11/26/2017   DM2 (diabetes mellitus, type 2) ([place]) 11/03/2017   Neurogenic bladder 11/03/2017   Spondylosis without myelopathy or radiculopathy, lumbar region 11/03/2017   Lumbar facet syndrome (Bilateral) (R>L)  11/03/2017   DDD (degenerative disc disease), lumbar 11/02/2017   Lumbar facet hypertrophy (Bilateral) 11/02/2017   Lumbar central spinal stenosis (L4-5) 11/02/2017   Lumbar lateral recess stenosis (L4-5) (Bilateral) (L>R) 11/02/2017   Vitamin D insufficiency 11/02/2017   Lumbar spondylosis 11/02/2017   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   Grief 10/18/2017   Chronic low back pain (Primary Area of Pain) (Bilateral) (R>L) w/ sciatica (Bilateral) 10/07/2017   Chronic lower extremity pain (Secondary Area of Pain) (Bilateral) (R>L) 10/07/2017   Chronic pain syndrome 10/07/2017   Pharmacologic therapy 10/07/2017   Disorder of skeletal system 10/07/2017   Problems influencing health status 10/07/2017   Barrett's esophagus without dysplasia 03/10/2017   Colon cancer screening 10/14/2016   Essential hypertension, benign 02/11/2016   Hyperlipidemia 02/11/2016   Tobacco dependence 02/11/2016   Obesity 02/11/2016   Lower limb ulcer, ankle, left, with fat layer exposed ([place]) 02/11/2016   Lymphedema 02/11/2016   Cellulitis 01/06/2016    Past Surgical History:  Procedure Laterality Date   COLONOSCOPY WITH PROPOFOL N/A 11/09/2016   Procedure: COLONOSCOPY WITH PROPOFOL;  Surgeon: Lollie Sails, MD;  Location: [facility] ENDOSCOPY;  Service: Endoscopy;  Laterality: N/A;   COLONOSCOPY WITH PROPOFOL N/A 07/06/2017   Procedure: COLONOSCOPY WITH PROPOFOL;  Surgeon: Lollie Sails, MD;  Location: [facility] ENDOSCOPY;  Service: Endoscopy;  Laterality: N/A;   ESOPHAGOGASTRODUODENOSCOPY (EGD) WITH PROPOFOL N/A 11/09/2016   Procedure: ESOPHAGOGASTRODUODENOSCOPY (EGD) WITH PROPOFOL;  Surgeon: Lollie Sails, MD;  Location: [facility] ENDOSCOPY;  Service: Endoscopy;  Laterality: N/A;   ESOPHAGOGASTRODUODENOSCOPY (EGD) WITH PROPOFOL N/A 07/06/2017   Procedure: ESOPHAGOGASTRODUODENOSCOPY (EGD) WITH PROPOFOL;  Surgeon: Lollie Sails, MD;   Location: [facility] ENDOSCOPY;  Service: Endoscopy;  Laterality: N/A;    Prior to Admission medications   Medication Sig Start Date End Date Taking? Authorizing Provider  albuterol (VENTOLIN HFA) 108 (90 Base) MCG/ACT inhaler Inhale 2 puffs into the lungs every 6 (six) hours as needed for wheezing or shortness of breath.    [provider]  aspirin-acetaminophen-caffeine (EXCEDRIN MIGRAINE) [phone] MG tablet Take by mouth every 6 (six) hours as needed for headache.    [provider]  atorvastatin (LIPITOR) 20 MG tablet Take 20 mg by mouth at bedtime.     [provider]  buPROPion (WELLBUTRIN SR) 150 MG 12 hr tablet Take 150 mg by mouth 2 (two) times daily.    [provider]  calcium carbonate (CALCIUM 600) 600 MG TABS tablet Take 1 tablet (600 mg total) by mouth 2 (two) times daily with a meal for 30 days. 05/05/18 06/17/18  Vevelyn Francois, NP  cholecalciferol (VITAMIN D3) 25 MCG (1000 UT) tablet Take 1 tablet (1,000 Units total) by mouth daily. 05/05/18 11/01/18  Vevelyn Francois, NP  doxycycline (VIBRAMYCIN) 100 MG capsule Take 1 capsule (100 mg total) by mouth 2 (two) times daily. 05/17/18   Kris Hartmann, NP  Fluticasone-Salmeterol (ADVAIR DISKUS) 100-50 MCG/DOSE AEPB Inhale 1 puff into the lungs 2 (two) times daily.     [provider]  furosemide (LASIX) 40 MG tablet Take 1 tablet (40 mg total) by mouth daily. 01/09/16   Loletha Grayer, MD  hydrochlorothiazide (MICROZIDE) 12.5 MG capsule Take 12.5 mg by mouth daily.     [provider]  loratadine (CLARITIN) 10 MG tablet Take 10 mg by mouth daily.    [provider]  losartan (COZAAR) 50 MG tablet Take 1 tablet (50 mg total) by mouth daily. 01/09/16   Loletha Grayer, MD  Magnesium 500 MG CAPS Take 1 capsule (500 mg total) by mouth 2 (two) times daily at 8 am and 10 pm. 05/04/18 10/31/18  Vevelyn Francois, NP  meloxicam (MOBIC) 15 MG tablet Take 1 tablet (15 mg total) by mouth  daily. 04/11/18 07/10/18  Vevelyn Francois, NP  metFORMIN (GLUCOPHAGE) 1000 MG tablet Take 1,000 mg by mouth 2 (two) times daily with a meal.    [provider]  oxybutynin (DITROPAN) 5 MG tablet Take 5 mg by mouth every 12 (twelve) hours.    [provider]  pantoprazole (PROTONIX) 40 MG tablet Take 40 mg by mouth 2 (two) times daily.     [provider]  tiZANidine (ZANAFLEX) 4 MG tablet Take 1 tablet (4 mg total) by mouth every 8 (eight) hours as needed for muscle spasms. 04/11/18 07/10/18  Vevelyn Francois, NP  traMADol (ULTRAM) 50 MG tablet Take 1 tablet (50 mg total) by mouth every 6 (six) hours as needed. 05/19/18 05/19/19  Rudene Re, MD    Allergies Shellfish allergy, Codeine, and Lisinopril  Family History  Problem Relation Age of Onset   Diabetes Mother    Diabetes Father    Stomach cancer Father     Social History Social History   Tobacco Use   Smoking status: Current Every Day Smoker    Packs/day: 1.00    Years: 30.00    Pack years: 30.00    Types: Cigarettes   Smokeless tobacco: Never Used  Substance Use Topics   Alcohol use: No   Drug use: No    Review of Systems Constitutional: No fever/chills denies cough or being around anyone with coronavirus Eyes: No visual changes. ENT: No sore throat. Cardiovascular: Denies chest pain. Respiratory: Denies shortness of breath. Gastrointestinal: No abdominal pain.   Genitourinary: Negative for dysuria. Musculoskeletal: Negative for back pain.  See HPI, pain over the left lower leg, redness swelling from about the level of the ankle up to the left knee. Skin: Negative for rash. Neurological: Negative for headaches, areas of focal weakness or numbness.    ____________________________________________   PHYSICAL EXAM:  VITAL SIGNS: ED Triage Vitals  Enc Vitals Group     BP 06/27/18 1020 (!) 162/82     Pulse Rate 06/27/18 1020 (!) 106     Resp --      Temp 06/27/18 1020 98.8 F  (37.1 C)     Temp Source 06/27/18 1020 Oral     SpO2 06/27/18 1020 97 %     Weight 06/27/18 1020 (!) 328 lb (148.8 kg)     Height 06/27/18 1020 5\' 9"  (1.753 m)     Head Circumference --      Peak Flow --      Pain Score 06/27/18 1023 9     Pain Loc --      Pain Edu? --      Excl. in [place]? --     Constitutional: Alert and oriented. Well appearing and in no acute distress.  He does appear in some pain though holding his left leg and reports very painful. Eyes: Conjunctivae are normal. Head: Atraumatic. Nose: No congestion/rhinnorhea. Mouth/Throat: Mucous membranes are moist. Neck: No stridor.  Cardiovascular: Normal rate, regular rhythm. Grossly normal heart sounds.  Good peripheral circulation. Respiratory: Normal respiratory effort.  No retractions. Lungs CTAB. Gastrointestinal: Soft and nontender. No distention. Musculoskeletal:   Bilateral lower extremities fairly edematous, left greater than right.  The left is warm hot to the touch and has some areas of slight exudates over the skin on the left calf.  Pulses Doppler bilateral positive DP.  Neurologic:  Normal speech and language. No gross focal neurologic deficits are appreciated.  Skin:  Skin is warm, dry and intact. No rash noted. Psychiatric: Mood and affect are normal. Speech and behavior are normal.  ____________________________________________   LABS (all labs ordered are listed, but only abnormal results are displayed)  Labs Reviewed  LACTIC ACID, PLASMA - Abnormal; Notable for the following components:      Result Value   Lactic Acid, Venous 2.7 (*)    All other components within normal limits  CULTURE, BLOOD (ROUTINE X 2)  CULTURE, BLOOD (ROUTINE X 2)  NOVEL CORONAVIRUS, NAA (HOSPITAL ORDER, SEND-OUT TO REF LAB)  CBC WITH DIFFERENTIAL/PLATELET  BASIC METABOLIC PANEL  CK   ____________________________________________  EKG   ____________________________________________  RADIOLOGY  Dg Tibia/fibula  Left  Result Date: 06/27/2018 CLINICAL DATA:  History of cellulitis. EXAM: LEFT TIBIA AND FIBULA - 2 VIEW COMPARISON:  No recent. FINDINGS: Diffuse soft tissue swelling. No acute bony or joint abnormality identified. No evidence of fracture or dislocation. IMPRESSION: Diffuse soft tissue swelling.  No acute bony abnormality. Electronically Signed   By: Marcello Moores  Register   On: 06/27/2018 12:53   US Venous Img Lower Unilateral Left  Result Date: 06/27/2018 CLINICAL DATA:  Pain, redness, swelling. EXAM: LEFT LOWER EXTREMITY VENOUS DOPPLER ULTRASOUND TECHNIQUE: Gray-scale sonography with graded compression, as well as color Doppler and  duplex ultrasound were performed to evaluate the lower extremity deep venous systems from the level of the common femoral vein and including the common femoral, femoral, profunda femoral, popliteal and calf veins including the posterior tibial, peroneal and gastrocnemius veins when visible. The superficial great saphenous vein was also interrogated. Spectral Doppler was utilized to evaluate flow at rest and with distal augmentation maneuvers in the common femoral, femoral and popliteal veins. COMPARISON:  06/27/2018. FINDINGS: Contralateral Common Femoral Vein: Respiratory phasicity is normal and symmetric with the symptomatic side. No evidence of thrombus. Normal compressibility. Common Femoral Vein: No evidence of thrombus. Normal compressibility, respiratory phasicity and response to augmentation. Saphenofemoral Junction: No evidence of thrombus. Normal compressibility and flow on color Doppler imaging. Profunda Femoral Vein: No evidence of thrombus. Normal compressibility and flow on color Doppler imaging. Femoral Vein: No evidence of thrombus. Normal compressibility, respiratory phasicity and response to augmentation. Popliteal Vein: No evidence of thrombus. Normal compressibility, respiratory phasicity and response to augmentation. Calf Veins: No evidence of thrombus. Normal  compressibility and flow on color Doppler imaging. Limited visibility due to patient's body habitus. Superficial Great Saphenous Vein: No evidence of thrombus. Normal compressibility. Other Findings:  None. IMPRESSION: No evidence of deep venous thrombosis. Electronically Signed   By: [place]   On: 06/27/2018 14:15    Ultrasound negative for DVT. ____________________________________________   PROCEDURES  Procedure(s) performed: None  Procedures  Critical Care performed: No  ____________________________________________   INITIAL IMPRESSION / ASSESSMENT AND PLAN / ED COURSE  Pertinent labs & imaging results that were available during my care of the patient were reviewed by me and considered in my medical decision making (see chart for details).   Patient presents for increasing discomfort redness over the left leg being treated for wound infection/cellulitis.  Appears that he has a worsening cellulitis, failing outpatient therapy.  Just slightly tachycardic.  He is not febrile.  No symptoms of COVID.  No evidence of acute vascular compromise.  Obtain ultrasound to evaluate for DVT, x-ray to screen for deep space infection though based on his previous history I doubt that he has a deep tracking infection at this time.  Have discussed with vascular surgery will consult on the patient and request admission to the hospitalist service.  Patient agreeable understand plan, morphine for pain control which she reports is helped in the past with this type of discomfort.  Clinical Course as of Jun 27 1434  Mon Jun 27, 2018  1227 Vascular surgery will consult and know patient, recommending Vanc/Zosyn per Dr. Bunnie Domino team.    [MQ]    Clinical Course User Index [MQ] Delman Kitten, MD   [name] was evaluated in Emergency Department on 06/27/2018 for the symptoms described in the history of present illness. He was evaluated in the context of the global COVID-19 pandemic, which necessitated  consideration that the patient might be at risk for infection with the SARS-CoV-2 virus that causes COVID-19. Institutional protocols and algorithms that pertain to the evaluation of patients at risk for COVID-19 are in a state of rapid change based on information released by regulatory bodies including the CDC and federal and state organizations. These policies and algorithms were followed during the patient's care in the ED.  ----------------------------------------- 2:36 PM on 06/27/2018 -----------------------------------------  Pain improved.  Patient reports about a 4 out of 10.  Resting comfortably.  Awaiting admission.  Discussed with Dr. Darvin Neighbours  ____________________________________________   FINAL CLINICAL IMPRESSION(S) / ED DIAGNOSES  Final diagnoses:  Cellulitis of left lower extremity        Note:  This document was prepared using Dragon voice recognition software and may include unintentional dictation errors       Delman Kitten, MD 06/27/18 1436

## 2018-06-27 NOTE — H&P (Signed)
[place] [facility] [place] NAME: [name]    MR#:  [id number]  DATE OF BIRTH:  02-Apr-1965  DATE OF ADMISSION:  06/27/2018  PRIMARY CARE PHYSICIAN: Theotis Burrow, MD   REQUESTING/REFERRING PHYSICIAN: Dr. Jacqualine Code  CHIEF COMPLAINT:   Chief Complaint  Patient presents with  . Wound Infection    HISTORY OF PRESENT ILLNESS:  [name]  is a 53 y.o. male with a known history of hypertension, diabetes mellitus, COPD, sleep apnea, chronic lower extremity lymphedema and recurrent cellulitis presently being treated with doxycycline as outpatient presents to the emergency room due to worsening redness and pain.  Here patient has been evaluated by vascular surgery team.  Patient has cellulitis going up to the knees.   Will need admission for IV antibiotics. Patient is afebrile.  Has tachycardia and elevated lactic acid. Failed outpatient antibiotic treatment PAST MEDICAL HISTORY:   Past Medical History:  Diagnosis Date  . COPD (chronic obstructive pulmonary disease) ([place])   . Coronary artery disease   . Diabetes mellitus without complication ([place])   . Hypertension   . Peptic ulcer   . Sleep apnea     PAST SURGICAL HISTORY:   Past Surgical History:  Procedure Laterality Date  . COLONOSCOPY WITH PROPOFOL N/A 11/09/2016   Procedure: COLONOSCOPY WITH PROPOFOL;  Surgeon: Lollie Sails, MD;  Location: [facility] ENDOSCOPY;  Service: Endoscopy;  Laterality: N/A;  . COLONOSCOPY WITH PROPOFOL N/A 07/06/2017   Procedure: COLONOSCOPY WITH PROPOFOL;  Surgeon: Lollie Sails, MD;  Location: [facility] ENDOSCOPY;  Service: Endoscopy;  Laterality: N/A;  . ESOPHAGOGASTRODUODENOSCOPY (EGD) WITH PROPOFOL N/A 11/09/2016   Procedure: ESOPHAGOGASTRODUODENOSCOPY (EGD) WITH PROPOFOL;  Surgeon: Lollie Sails, MD;  Location: [facility] ENDOSCOPY;  Service: Endoscopy;  Laterality: N/A;  . ESOPHAGOGASTRODUODENOSCOPY (EGD) WITH PROPOFOL N/A 07/06/2017   Procedure:  ESOPHAGOGASTRODUODENOSCOPY (EGD) WITH PROPOFOL;  Surgeon: Lollie Sails, MD;  Location: [facility] ENDOSCOPY;  Service: Endoscopy;  Laterality: N/A;    SOCIAL HISTORY:   Social History   Tobacco Use  . Smoking status: Current Every Day Smoker    Packs/day: 1.00    Years: 30.00    Pack years: 30.00    Types: Cigarettes  . Smokeless tobacco: Never Used  Substance Use Topics  . Alcohol use: No    FAMILY HISTORY:   Family History  Problem Relation Age of Onset  . Diabetes Mother   . Diabetes Father   . Stomach cancer Father     DRUG ALLERGIES:   Allergies  Allergen Reactions  . Shellfish Allergy Swelling  . Codeine Rash  . Lisinopril Cough    REVIEW OF SYSTEMS:   Review of Systems  Constitutional: Positive for malaise/fatigue. Negative for chills and fever.  HENT: Negative for sore throat.   Eyes: Negative for blurred vision, double vision and pain.  Respiratory: Positive for shortness of breath (chronic). Negative for cough, hemoptysis and wheezing.   Cardiovascular: Negative for chest pain, palpitations, orthopnea and leg swelling.  Gastrointestinal: Negative for abdominal pain, constipation, diarrhea, heartburn, nausea and vomiting.  Genitourinary: Negative for dysuria and hematuria.  Musculoskeletal: Positive for joint pain. Negative for back pain.  Skin: Negative for rash.  Neurological: Negative for sensory change, speech change, focal weakness and headaches.  Endo/Heme/Allergies: Does not bruise/bleed easily.  Psychiatric/Behavioral: Negative for depression. The patient is not nervous/anxious.     MEDICATIONS AT HOME:   Prior to Admission medications   Medication Sig Start Date End Date Taking? Authorizing Provider  albuterol (VENTOLIN HFA) 108 (90 Base) MCG/ACT inhaler Inhale 2 puffs into the lungs every 6 (six) hours as needed for wheezing or shortness of breath.    [provider]  aspirin-acetaminophen-caffeine (EXCEDRIN MIGRAINE) [phone] MG  tablet Take by mouth every 6 (six) hours as needed for headache.    [provider]  atorvastatin (LIPITOR) 20 MG tablet Take 20 mg by mouth at bedtime.     [provider]  buPROPion (WELLBUTRIN SR) 150 MG 12 hr tablet Take 150 mg by mouth 2 (two) times daily.    [provider]  calcium carbonate (CALCIUM 600) 600 MG TABS tablet Take 1 tablet (600 mg total) by mouth 2 (two) times daily with a meal for 30 days. 05/05/18 06/17/18  Vevelyn Francois, NP  cholecalciferol (VITAMIN D3) 25 MCG (1000 UT) tablet Take 1 tablet (1,000 Units total) by mouth daily. 05/05/18 11/01/18  Vevelyn Francois, NP  doxycycline (VIBRAMYCIN) 100 MG capsule Take 1 capsule (100 mg total) by mouth 2 (two) times daily. 05/17/18   Kris Hartmann, NP  Fluticasone-Salmeterol (ADVAIR DISKUS) 100-50 MCG/DOSE AEPB Inhale 1 puff into the lungs 2 (two) times daily.     [provider]  furosemide (LASIX) 40 MG tablet Take 1 tablet (40 mg total) by mouth daily. 01/09/16   Loletha Grayer, MD  hydrochlorothiazide (MICROZIDE) 12.5 MG capsule Take 12.5 mg by mouth daily.     [provider]  loratadine (CLARITIN) 10 MG tablet Take 10 mg by mouth daily.    [provider]  losartan (COZAAR) 50 MG tablet Take 1 tablet (50 mg total) by mouth daily. 01/09/16   Loletha Grayer, MD  Magnesium 500 MG CAPS Take 1 capsule (500 mg total) by mouth 2 (two) times daily at 8 am and 10 pm. 05/04/18 10/31/18  Vevelyn Francois, NP  meloxicam (MOBIC) 15 MG tablet Take 1 tablet (15 mg total) by mouth daily. 04/11/18 07/10/18  Vevelyn Francois, NP  metFORMIN (GLUCOPHAGE) 1000 MG tablet Take 1,000 mg by mouth 2 (two) times daily with a meal.    [provider]  oxybutynin (DITROPAN) 5 MG tablet Take 5 mg by mouth every 12 (twelve) hours.    [provider]  pantoprazole (PROTONIX) 40 MG tablet Take 40 mg by mouth 2 (two) times daily.     [provider]  tiZANidine (ZANAFLEX) 4 MG tablet  Take 1 tablet (4 mg total) by mouth every 8 (eight) hours as needed for muscle spasms. 04/11/18 07/10/18  Vevelyn Francois, NP  traMADol (ULTRAM) 50 MG tablet Take 1 tablet (50 mg total) by mouth every 6 (six) hours as needed. 05/19/18 05/19/19  Rudene Re, MD     VITAL SIGNS:  Blood pressure (!) 164/94, pulse 86, temperature 98.8 F (37.1 C), temperature source Oral, resp. rate 20, height 5\' 9"  (1.753 m), weight (!) 148.8 kg, SpO2 98 %.  PHYSICAL EXAMINATION:  Physical Exam  GENERAL:  53 y.o.-year-old patient lying in the bed with no acute distress.  Obese EYES: Pupils equal, round, reactive to light and accommodation. No scleral icterus. Extraocular muscles intact.  HEENT: Head atraumatic, normocephalic. Oropharynx and nasopharynx clear. No oropharyngeal erythema, moist oral mucosa  NECK:  Supple, no jugular venous distention. No thyroid enlargement, no tenderness.  LUNGS: Good air entry.  Mild bilateral basal wheezing. CARDIOVASCULAR: S1, S2 normal. No murmurs, rubs, or gallops.  ABDOMEN: Soft, nontender, nondistended. Bowel sounds present. No organomegaly or mass.  EXTREMITIES: Bilateral lower  extremity redness extending up to the knees.  Has chronic stasis changes.  Weeping from his left calf area. NEUROLOGIC: Cranial nerves II through XII are intact. No focal Motor or sensory deficits appreciated b/l PSYCHIATRIC: The patient is alert and oriented x 3. Good affect.  SKIN: No obvious rash, lesion, or ulcer.   LABORATORY PANEL:   CBC Recent Labs  Lab 06/27/18 1239  WBC 8.5  HGB 15.3  HCT 47.7  PLT 312   ------------------------------------------------------------------------------------------------------------------  Chemistries  Recent Labs  Lab 06/27/18 1239  NA 140  K 4.4  CL 101  CO2 27  GLUCOSE 94  BUN 10  CREATININE 0.99  CALCIUM 9.6    ------------------------------------------------------------------------------------------------------------------  Cardiac Enzymes No results for input(s): TROPONINI in the last 168 hours. ------------------------------------------------------------------------------------------------------------------  RADIOLOGY:  Dg Tibia/fibula Left  Result Date: 06/27/2018 CLINICAL DATA:  History of cellulitis. EXAM: LEFT TIBIA AND FIBULA - 2 VIEW COMPARISON:  No recent. FINDINGS: Diffuse soft tissue swelling. No acute bony or joint abnormality identified. No evidence of fracture or dislocation. IMPRESSION: Diffuse soft tissue swelling.  No acute bony abnormality. Electronically Signed   By: Marcello Moores  Register   On: 06/27/2018 12:53   US Venous Img Lower Unilateral Left  Result Date: 06/27/2018 CLINICAL DATA:  Pain, redness, swelling. EXAM: LEFT LOWER EXTREMITY VENOUS DOPPLER ULTRASOUND TECHNIQUE: Gray-scale sonography with graded compression, as well as color Doppler and duplex ultrasound were performed to evaluate the lower extremity deep venous systems from the level of the common femoral vein and including the common femoral, femoral, profunda femoral, popliteal and calf veins including the posterior tibial, peroneal and gastrocnemius veins when visible. The superficial great saphenous vein was also interrogated. Spectral Doppler was utilized to evaluate flow at rest and with distal augmentation maneuvers in the common femoral, femoral and popliteal veins. COMPARISON:  06/27/2018. FINDINGS: Contralateral Common Femoral Vein: Respiratory phasicity is normal and symmetric with the symptomatic side. No evidence of thrombus. Normal compressibility. Common Femoral Vein: No evidence of thrombus. Normal compressibility, respiratory phasicity and response to augmentation. Saphenofemoral Junction: No evidence of thrombus. Normal compressibility and flow on color Doppler imaging. Profunda Femoral Vein: No evidence of  thrombus. Normal compressibility and flow on color Doppler imaging. Femoral Vein: No evidence of thrombus. Normal compressibility, respiratory phasicity and response to augmentation. Popliteal Vein: No evidence of thrombus. Normal compressibility, respiratory phasicity and response to augmentation. Calf Veins: No evidence of thrombus. Normal compressibility and flow on color Doppler imaging. Limited visibility due to patient's body habitus. Superficial Great Saphenous Vein: No evidence of thrombus. Normal compressibility. Other Findings:  None. IMPRESSION: No evidence of deep venous thrombosis. Electronically Signed   By: Marcello Moores  Register   On: 06/27/2018 14:15     IMPRESSION AND PLAN:   *Bilateral lower extremity cellulitis secondary to lymphedema.  We will start him on broad-spectrum antibiotics with vancomycin and Zosyn as requested by his vascular surgery team.  Blood culture sent and pending.  Monitor for any worsening and need for surgery.  Appreciate vascular surgery input.  *Diabetes mellitus.  Hold metformin.  Sliding scale insulin and diabetic diet  *COPD.  Has chronic shortness of breath but not needing oxygen.  Continue home nebulizers and inhalers.  *Hypertension.  Continue home medications once available.  *DVT prophylaxis with Lovenox  All the records are reviewed and case discussed with ED provider. Management plans discussed with the patient, family and they are in agreement.  CODE STATUS: Full code  TOTAL TIME TAKING CARE OF THIS PATIENT: 30  minutes.   Leia Alf Herve Haug M.D on 06/27/2018 at 3:20 PM  Between 7am to 6pm - Pager - [phone]  After 6pm go to [email or web address] - password [email or web address] [facility]  Office  [phone]  CC: Primary care physician; Theotis Burrow, MD  Note: This dictation was prepared with Dragon dictation along with smaller phrase technology. Any transcriptional errors that result from this process are unintentional.

## 2018-06-27 NOTE — Progress Notes (Signed)
Advance care planning  Purpose of Encounter Bilateral lower extremity cellulitis  Parties in Attendance Patient  Patients Decisional capacity Patient is alert and oriented.  Able to make medical decisions.  His emergency contact and healthcare power of attorney is his sister  Discussed in detail regarding bilateral lower extremity cellulitis.  Treatment plan , prognosis discussed.  All questions answered [name].  No ACP documents in place  Discussed regarding CODE STATUS and patient wishes for aggressive medical care along with CPR/intubation/defibrillation if needed.  Orders entered and CODE STATUS changed  FULL CODE  Time spent - 17 minutes

## 2018-06-27 NOTE — Consult Note (Signed)
[facility] Consult Note  MRN : [id number]  [name] is a 53 y.o. (1965-06-10) male who presents with chief complaint of  Chief Complaint  Patient presents with  . 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  The patient was last seen in our office on 06/17/18 for progressively worsening lymphedema, skin breakdown and cellulitis of the bilateral legs.  At that time, he was prescribed oral antibiotics and placed in three layer zinc oxide unna wraps. The patient endorses a history of presenting to our office today for his weekly Unna wrap change and one of our CMA's noted worsening erythema to his bilateral lower extremities.  Our nurse practitioner Eulogio Ditch examined the patient and felt his cellulitis had worsened in the setting of oral antibiotics and compression with in a wraps and advised him to seek medical attention in our emergency department.  The patient endorses a history of progressively worsening pain to the bilateral legs x 2 to 3 days.  He denies any fever, nausea vomiting.  The patient states that he has been elevating his legs however he has not been using his lymphedema pump.  He states that he has not been using his lymphedema pump due to the pain it causes when he has cellulitis.   01/2016: ABI - no evidence of bilateral lower extremity atherosclerotic disease.  Triphasic tibials.  01/2016: No evidence of venous reflux noted to the bilateral legs.  No DVT.   No SVT.  Vascular surgery was consulted by emergency room physician Dr. Jacqualine Code for further recommendations. Current Facility-Administered Medications  Medication Dose Route Frequency Provider Last Rate Last Dose  . piperacillin-tazobactam (ZOSYN) IVPB 3.375 g  3.375 g Intravenous Once Lu Duffel, RPH      . vancomycin (VANCOCIN) 2,500 mg in sodium chloride 0.9 % 500 mL IVPB  2,500 mg Intravenous Once Lu Duffel, [facility]       Current Outpatient Medications  Medication Sig Dispense Refill  . albuterol (VENTOLIN HFA) 108 (90 Base) MCG/ACT inhaler Inhale 2 puffs into the lungs every 6 (six) hours as needed for wheezing or shortness of breath.    Marland Kitchen aspirin-acetaminophen-caffeine (EXCEDRIN MIGRAINE) 250-250-65 MG tablet Take by mouth every 6 (six) hours as needed for headache.    Marland Kitchen atorvastatin (LIPITOR) 20 MG tablet Take 20 mg by mouth at bedtime.     Marland Kitchen buPROPion (WELLBUTRIN SR) 150 MG 12 hr tablet Take 150 mg by mouth 2 (two) times daily.    . calcium carbonate (CALCIUM 600) 600 MG TABS tablet Take 1 tablet (600 mg total) by mouth 2 (two) times daily with a meal for 30 days. 60 tablet 0  . cholecalciferol (VITAMIN D3) 25 MCG (1000 UT) tablet Take 1 tablet (1,000 Units total) by mouth daily. 30 tablet 5  . doxycycline (VIBRAMYCIN) 100 MG capsule Take 1 capsule (100 mg total) by mouth 2 (two) times daily. 28 capsule 0  . Fluticasone-Salmeterol (ADVAIR DISKUS) 100-50 MCG/DOSE AEPB Inhale 1 puff into the lungs 2 (two) times daily.     . furosemide (LASIX) 40 MG tablet Take  1 tablet (40 mg total) by mouth daily. 30 tablet 0  . hydrochlorothiazide (MICROZIDE) 12.5 MG capsule Take 12.5 mg by mouth daily.   0  . loratadine (CLARITIN) 10 MG tablet Take 10 mg by mouth daily.    Marland Kitchen losartan (COZAAR) 50 MG tablet Take 1 tablet (50 mg total) by mouth daily. 30 tablet 0  . Magnesium 500 MG CAPS Take 1 capsule (500 mg total) by mouth 2 (two) times daily at 8 am and 10 pm. 60 capsule 5  . meloxicam  (MOBIC) 15 MG tablet Take 1 tablet (15 mg total) by mouth daily. 30 tablet 2  . metFORMIN (GLUCOPHAGE) 1000 MG tablet Take 1,000 mg by mouth 2 (two) times daily with a meal.    . oxybutynin (DITROPAN) 5 MG tablet Take 5 mg by mouth every 12 (twelve) hours.    . pantoprazole (PROTONIX) 40 MG tablet Take 40 mg by mouth 2 (two) times daily.     Marland Kitchen tiZANidine (ZANAFLEX) 4 MG tablet Take 1 tablet (4 mg total) by mouth every 8 (eight) hours as needed for muscle spasms. 90 tablet 2  . traMADol (ULTRAM) 50 MG tablet Take 1 tablet (50 mg total) by mouth every 6 (six) hours as needed. 20 tablet 0   Past Medical History:  Diagnosis Date  . COPD (chronic obstructive pulmonary disease) ([place])   . Coronary artery disease   . Diabetes mellitus without complication ([place])   . Hypertension   . Peptic ulcer   . Sleep apnea    Past Surgical History:  Procedure Laterality Date  . COLONOSCOPY WITH PROPOFOL N/A 11/09/2016   Procedure: COLONOSCOPY WITH PROPOFOL;  Surgeon: Lollie Sails, MD;  Location: [facility] ENDOSCOPY;  Service: Endoscopy;  Laterality: N/A;  . COLONOSCOPY WITH PROPOFOL N/A 07/06/2017   Procedure: COLONOSCOPY WITH PROPOFOL;  Surgeon: Lollie Sails, MD;  Location: [facility] ENDOSCOPY;  Service: Endoscopy;  Laterality: N/A;  . ESOPHAGOGASTRODUODENOSCOPY (EGD) WITH PROPOFOL N/A 11/09/2016   Procedure: ESOPHAGOGASTRODUODENOSCOPY (EGD) WITH PROPOFOL;  Surgeon: Lollie Sails, MD;  Location: [facility] ENDOSCOPY;  Service: Endoscopy;  Laterality: N/A;  . ESOPHAGOGASTRODUODENOSCOPY (EGD) WITH PROPOFOL N/A 07/06/2017   Procedure: ESOPHAGOGASTRODUODENOSCOPY (EGD) WITH PROPOFOL;  Surgeon: Lollie Sails, MD;  Location: [facility] ENDOSCOPY;  Service: Endoscopy;  Laterality: N/A;   Social History Social History   Tobacco Use  . Smoking status: Current Every Day Smoker    Packs/day: 1.00    Years: 30.00    Pack years: 30.00    Types: Cigarettes  . Smokeless tobacco: Never Used  Substance Use Topics  .  Alcohol use: No  . Drug use: No   Family History Family History  Problem Relation Age of Onset  . Diabetes Mother   . Diabetes Father   . Stomach cancer Father   Denies family history of peripheral artery disease, venous disease and/or bleeding/clotting disorders.  Allergies  Allergen Reactions  . Shellfish Allergy Swelling  . Codeine Rash  . Lisinopril Cough   REVIEW OF SYSTEMS (Negative unless checked)  Constitutional: [] Weight loss  [] Fever  [] Chills Cardiac: [] Chest pain   [] Chest pressure   [] Palpitations   [] Shortness of breath when laying flat   [] Shortness of breath at rest   [] Shortness of breath with exertion. Vascular:  [x] Pain in legs with walking   [x] Pain in legs at rest   [x] Pain in legs when laying flat   [] Claudication   [] Pain in feet when walking  [] Pain in feet at rest  [] Pain in feet  when laying flat   [] History of DVT   [] Phlebitis   [x] Swelling in legs   [] Varicose veins   [x] Non-healing ulcers Pulmonary:   [] Uses home oxygen   [] Productive cough   [] Hemoptysis   [] Wheeze  [] COPD   [] Asthma Neurologic:  [] Dizziness  [] Blackouts   [] Seizures   [] History of stroke   [] History of TIA  [] Aphasia   [] Temporary blindness   [] Dysphagia   [] Weakness or numbness in arms   [] Weakness or numbness in legs Musculoskeletal:  [] Arthritis   [] Joint swelling   [] Joint pain   [] Low back pain Hematologic:  [] Easy bruising  [] Easy bleeding   [] Hypercoagulable state   [] Anemic  [] Hepatitis Gastrointestinal:  [] Blood in stool   [] Vomiting blood  [] Gastroesophageal reflux/heartburn   [] Difficulty swallowing. Genitourinary:  [] Chronic kidney disease   [] Difficult urination  [] Frequent urination  [] Burning with urination   [] Blood in urine Skin:  [] Rashes   [x] Ulcers   [x] Wounds Psychological:  [] History of anxiety   []  History of major depression.  Physical Examination  Vitals:   06/27/18 1020  BP: (!) 162/82  Pulse: (!) 106  Temp: 98.8 F (37.1 C)  TempSrc: Oral  SpO2: 97%   Weight: (!) 148.8 kg  Height: 5\' 9"  (1.753 m)   Body mass index is 48.44 kg/m. Gen:  WD/WN, NAD Head: [place]/AT, No temporalis wasting. Prominent temp pulse not noted. Ear/Nose/Throat: Hearing grossly intact, nares w/o erythema or drainage, oropharynx w/o Erythema/Exudate Eyes: Sclera non-icteric, conjunctiva clear Neck: Trachea midline.  No JVD.  Pulmonary:  Good air movement, respirations not labored, equal bilaterally.  Cardiac: RRR, normal S1, S2. Vascular:  Vessel Right Left  Radial Palpable Palpable  Ulnar Palpable Palpable  Brachial Palpable Palpable  Carotid Palpable, without bruit Palpable, without bruit  Aorta Not palpable N/A  Femoral Palpable Palpable  Popliteal Palpable Palpable  PT Non-Palpable Non-Palpable  DP Non-Palpable Non-Palpable   Lower Extremity: Erythematous / cellulitis starting to track up thighs. Thighs soft, calves soft.  Extremities warm distally to toes.  Hard to palpate pedal pulses bilaterally however this is most likely due to to body habitus and edema.  There is a good capillary refill to the bilateral toes.  Motor/sensory is intact bilaterally.  Superficial ulcerations scattered.  Gastrointestinal: soft, non-tender/non-distended. No guarding/reflex.  Musculoskeletal: M/S 5/5 throughout.  Extremities without ischemic changes.  No deformity or atrophy. Moderate edema. Neurologic: Sensation grossly intact in extremities.  Symmetrical.  Speech is fluent. Motor exam as listed above. Psychiatric: Judgment intact, Mood & affect appropriate for pt's clinical situation. Dermatologic: As above Lymph : No Cervical, Axillary, or Inguinal lymphadenopathy.  CBC Lab Results  Component Value Date   WBC 8.5 06/27/2018   HGB 15.3 06/27/2018   HCT 47.7 06/27/2018   MCV 86.4 06/27/2018   PLT 312 06/27/2018   BMET    Component Value Date/Time   NA 140 06/27/2018 1239   NA 140 10/07/2017 1604   K 4.4 06/27/2018 1239   CL 101 06/27/2018 1239   CO2 27  06/27/2018 1239   GLUCOSE 94 06/27/2018 1239   BUN 10 06/27/2018 1239   BUN 10 10/07/2017 1604   CREATININE 0.99 06/27/2018 1239   CALCIUM 9.6 06/27/2018 1239   GFRNONAA >60 06/27/2018 1239   GFRAA >60 06/27/2018 1239   Estimated Creatinine Clearance: 125.8 mL/min (by C-G formula based on SCr of 0.99 mg/dL).  COAG No results found for: INR, PROTIME  Radiology Dg Tibia/fibula Left  Result Date: 06/27/2018 CLINICAL DATA:  History  of cellulitis. EXAM: LEFT TIBIA AND FIBULA - 2 VIEW COMPARISON:  No recent. FINDINGS: Diffuse soft tissue swelling. No acute bony or joint abnormality identified. No evidence of fracture or dislocation. IMPRESSION: Diffuse soft tissue swelling.  No acute bony abnormality. 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 emergency department with worsening cellulitis. 1. Bilateral Lower Extremity Cellulitis: Patient well known to our practice. He has been treated for bilateral lower extremity cellulitis with ulcer formation in the past.  Patient does have a history of noncompliance with wearing compression, engaging in elevation and using his lymphedema pump as directed.  He presents today with worsening cellulitis of the bilateral legs in the setting of being on PO ABX and in three layer zinc oxide unna wraps.  He is afebrile at this time.  Cellulitis is tracking up his bilateral thighs at this point.  Last ABI without any evidence of lower extremity atherosclerotic disease.  At this time, I do not feel  his recurrent cellulitis is from arterial insufficiency more from non-compliance / contributing medical issues / ongoing tobacco abuse however we will repeat a ABI in the outpatient setting. Agree with admission for elevation of legs / IV ABX.  Would consult the wound nurse to reapply three layer zinc oxide and wraps to the bilateral legs. 2. Tobacco Abuse: We had a discussion for approximately three minutes regarding the absolute need for smoking cessation due to the deleterious nature of tobacco on the vascular system. We discussed the tobacco use would diminish patency of any intervention, and likely significantly worsen progressio of disease. We discussed multiple agents for quitting including replacement therapy or medications to reduce cravings such as Chantix. The patient voices their understanding of the importance of smoking cessation. 3. Hyperlipidemia: On statin.  Recommend the addition of aspirin 81 mg daily for medical management. Encouraged good control as its slows the progression of atherosclerotic disease. 4. Diabetes: On appropriate medications. Encouraged good control as its slows the progression of atherosclerotic disease 5. Hypertension: On appropriate medications. Encouraged good control as its slows the progression of atherosclerotic disease.  Discussed with Dr. Mayme Genta, PA-C  06/27/2018 1:14 PM  This note was created with Dragon medical transcription system.  Any error is purely unintentional

## 2018-06-27 NOTE — ED Triage Notes (Signed)
Pt reports has cellulitis to his left lower leg and has been on abx but the area is worse now and he thinks it is more infected and swollen.

## 2018-06-28 DIAGNOSIS — L97229 Non-pressure chronic ulcer of left calf with unspecified severity: Secondary | ICD-10-CM

## 2018-06-28 LAB — BASIC METABOLIC PANEL
Anion gap: 9 (ref 5–15)
BUN: 11 mg/dL (ref 6–20)
CO2: 25 mmol/L (ref 22–32)
Calcium: 8.7 mg/dL — ABNORMAL LOW (ref 8.9–10.3)
Chloride: 104 mmol/L (ref 98–111)
Creatinine, Ser: 0.91 mg/dL (ref 0.61–1.24)
GFR calc Af Amer: >60 mL/min (ref >60)
GFR calc non Af Amer: >60 mL/min (ref >60)
Glucose, Bld: 115 mg/dL — ABNORMAL HIGH (ref 70–99)
Potassium: 4.1 mmol/L (ref 3.5–5.1)
Sodium: 138 mmol/L (ref 135–145)

## 2018-06-28 LAB — CBC
HCT: 42.9 % (ref 39.0–52.0)
Hemoglobin: 13.6 g/dL (ref 13.0–17.0)
MCH: 27.8 pg (ref 26.0–34.0)
MCHC: 31.7 g/dL (ref 30.0–36.0)
MCV: 87.6 fL (ref 80.0–100.0)
Platelets: 273 10*3/uL (ref 150–400)
RBC: 4.9 MIL/uL (ref 4.22–5.81)
RDW: 14.2 % (ref 11.5–15.5)
WBC: 9 10*3/uL (ref 4.0–10.5)
nRBC: 0 % (ref 0.0–0.2)

## 2018-06-28 LAB — NOVEL CORONAVIRUS, NAA (HOSP ORDER, SEND-OUT TO REF LAB; TAT 18-24 HRS): SARS-CoV-2, NAA: NOT DETECTED

## 2018-06-28 LAB — GLUCOSE, CAPILLARY
Glucose-Capillary: 112 mg/dL — ABNORMAL HIGH (ref 70–99)
Glucose-Capillary: 146 mg/dL — ABNORMAL HIGH (ref 70–99)
Glucose-Capillary: 95 mg/dL (ref 70–99)
Glucose-Capillary: 105 mg/dL — ABNORMAL HIGH (ref 70–99)

## 2018-06-28 MED ORDER — ENOXAPARIN SODIUM 40 MG/0.4ML ~~LOC~~ SOLN
40.0000 mg | Freq: Two times a day (BID) | SUBCUTANEOUS | Status: DC
  Administered 2018-06-28 – 2018-06-29 (×4): 40 mg via SUBCUTANEOUS
  Filled 2018-06-28 (×5): qty 0.4

## 2018-06-28 MED ORDER — VANCOMYCIN HCL 1.5 G IV SOLR
1500.0000 mg | Freq: Two times a day (BID) | INTRAVENOUS | Status: DC
  Administered 2018-06-28 – 2018-06-29 (×2): 1500 mg via INTRAVENOUS
  Filled 2018-06-28 (×3): qty 1500

## 2018-06-28 MED ORDER — FLUTICASONE FUROATE-VILANTEROL 100-25 MCG/INH IN AEPB
1.0000 | INHALATION_SPRAY | Freq: Every day | RESPIRATORY_TRACT | Status: DC
  Administered 2018-06-28 – 2018-06-30 (×3): 1 via RESPIRATORY_TRACT
  Filled 2018-06-28: qty 28

## 2018-06-28 NOTE — Consult Note (Signed)
[place] Nurse wound consult note Reason for Consult:apply unna's boots bilaterally  Wound type: venous stasis  Pressure Injury POA: NA Measurement:large area on the posterior left calf that is macerated and weeping, area on the pretibial area of the left LE that is crusted. RLE with cellulitis weeping from the posterior right calf  Wound bed:see above Drainage (amount, consistency, odor) serous Periwound: venous skin changes consistent with venous dermatitis  Dressing procedure/placement/frequency:  Apply zinc paste Unna's boots bilaterally Will need to be changed weekly, Q Tuesday. Patient is followed outpatient by vascular.  [place], [place], [place]

## 2018-06-28 NOTE — Progress Notes (Signed)
[facility]  Daily Progress Note   Subjective: Patient notes improved discomfort and erythema to bilateral legs. No issues overnight.   Objective: Vitals:   06/27/18 1300 06/27/18 1638 06/27/18 2125 06/28/18 0616  BP: (!) 164/94 128/88 121/77 139/80  Pulse: 86 94 (!) 102 (!) 101  Resp: 20 20 20  (!) 22  Temp:  97.8 F (36.6 C) 98.9 F (37.2 C) 98.4 F (36.9 C)  TempSrc:  Oral Oral Oral  SpO2: 98% 99% 94% 94%  Weight:      Height:        Intake/Output Summary (Last 24 hours) at 06/28/2018 1058 Last data filed at 06/27/2018 2305 Gross per 24 hour  Intake -  Output 400 ml  Net -400 ml   Physical Exam: A&Ox3, NAD CV: RRR Pulmonary: CTA Bilaterally Abdomen: Soft, Nontender, Nondistended Vascular:  Lower Extremity: Cellulitis is improving.  Clear drainage from ulceration noted to left medial calf.   Laboratory: CBC    Component Value Date/Time   WBC 9.0 06/28/2018 0547   HGB 13.6 06/28/2018 0547   HCT 42.9 06/28/2018 0547   PLT 273 06/28/2018 0547   BMET    Component Value Date/Time   NA 138 06/28/2018 0547   NA 140 10/07/2017 1604   K 4.1 06/28/2018 0547   CL 104 06/28/2018 0547   CO2 25 06/28/2018 0547   GLUCOSE 115 (H) 06/28/2018 0547   BUN 11 06/28/2018 0547   BUN 10 10/07/2017 1604   CREATININE 0.91 06/28/2018 0547   CALCIUM 8.7 (L) 06/28/2018 0547   GFRNONAA >60 06/28/2018 0547   GFRAA >60 06/28/2018 0547   Assessment/Planning: The patient is a 53 year old male well-known to our practice with a past medical history of sleep apnea, peptic ulcer disease, hypertension, diabetes, hyperlipidemia, coronary artery disease, COPD, obesity, current active everyday tobacco abuser, chronic pain due to 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 1) Noted improvement to  patients cellulitis 2) Will order wound care consult to place unna wraps to bilateral lower extremities. Would continue wraps until ulcerations are healed.  3) I had a note a long conversation with the patient in regard to controlling his lymphedema to avoid future/recurrent bouts of cellulitis.  He understands that he must wear compression socks placing them in the a.m. and removing them in the p.m., elevating his legs heart level or higher as much as possible and using his lymphedema pump at least twice a day for an hour each time.  The patient expresses his understanding.  Discussed with Dr. Ellis Parents Tron Flythe PA-C 06/28/2018 10:58 AM

## 2018-06-28 NOTE — Progress Notes (Signed)
PHARMACIST - PHYSICIAN COMMUNICATION  CONCERNING:  Enoxaparin (Lovenox) for DVT Prophylaxis    RECOMMENDATION: Patient was prescribed enoxaprin 40mg  q24 hours for VTE prophylaxis.   Filed Weights   06/27/18 1020  Weight: (!) 328 lb (148.8 kg)    Body mass index is 48.44 kg/m.  Estimated Creatinine Clearance: 136.9 mL/min (by C-G formula based on SCr of 0.91 mg/dL).   Based on [place] patient is candidate for enoxaparin 40mg  every 12 hour dosing due to BMI being >40.  DESCRIPTION: Pharmacy has adjusted enoxaparin dose per [facility] policy.  Patient is now receiving enoxaparin 40mg  every 12 hours.   Dallie Piles, PharmD Clinical Pharmacist  06/28/2018 7:37 AM

## 2018-06-28 NOTE — Progress Notes (Signed)
[name] [facility] [place] NAME: [name]    MR#:  [id number]  DATE OF BIRTH:  04/20/1965  SUBJECTIVE:  CHIEF COMPLAINT:   Chief Complaint  Patient presents with  . Wound Infection   No new complaint this morning.  Patient however reported having some mild drainage in the groin.  Cultures sent.  No fevers. REVIEW OF SYSTEMS:  Review of Systems  Constitutional: Negative for chills and fever.  HENT: Negative for hearing loss and tinnitus.   Eyes: Negative for blurred vision and double vision.  Respiratory: Negative for cough and shortness of breath.   Cardiovascular: Negative for chest pain and palpitations.  Gastrointestinal: Negative for heartburn, nausea and vomiting.  Genitourinary: Negative for dysuria and urgency.  Musculoskeletal: Negative for myalgias and neck pain.  Skin: Negative for itching and rash.  Neurological: Negative for dizziness and headaches.  Psychiatric/Behavioral: Negative for depression and hallucinations.    DRUG ALLERGIES:   Allergies  Allergen Reactions  . Shellfish Allergy Swelling  . Codeine Rash  . Lisinopril Cough   VITALS:  Blood pressure 128/67, pulse 97, temperature 98.2 F (36.8 C), temperature source Oral, resp. rate 20, height 5\' 9"  (1.753 m), weight (!) 148.8 kg, SpO2 95 %. PHYSICAL EXAMINATION:  Physical Exam  GENERAL:  53 y.o.-year-old patient lying in the bed with no acute distress.  Obese EYES: Pupils equal, round, reactive to light and accommodation. No scleral icterus. Extraocular muscles intact.  HEENT: Head atraumatic, normocephalic. Oropharynx and nasopharynx clear. No oropharyngeal erythema, moist oral mucosa  NECK:  Supple, no jugular venous distention. No thyroid enlargement, no tenderness.  LUNGS: Good air entry.  Mild bilateral basal wheezing. CARDIOVASCULAR: S1, S2 normal. No murmurs, rubs, or gallops.  ABDOMEN: Soft, nontender, nondistended. Bowel sounds present. No  organomegaly or mass. Mild sero-sanguinous drainage from the groin. EXTREMITIES: Bilateral lower extremity redness extending up to the knees.  Has chronic stasis changes.  Weeping from his left calf area. NEUROLOGIC: Cranial nerves II through XII are intact. No focal Motor or sensory deficits appreciated b/l PSYCHIATRIC: The patient is alert and oriented x 3. Good affect.  SKIN: No obvious rash, lesion, or ulcer.   LABORATORY PANEL:  Male CBC Recent Labs  Lab 06/28/18 0547  WBC 9.0  HGB 13.6  HCT 42.9  PLT 273   ------------------------------------------------------------------------------------------------------------------ Chemistries  Recent Labs  Lab 06/28/18 0547  NA 138  K 4.1  CL 104  CO2 25  GLUCOSE 115*  BUN 11  CREATININE 0.91  CALCIUM 8.7*   RADIOLOGY:  No results found. ASSESSMENT AND PLAN:   1. Bilateral lower extremity cellulitis secondary to lymphedema.   Patient currently on broad-spectrum IV antibiotics with vancomycin and Zosyn as requested by his vascular surgery team.  Blood culture sent and pending.  Monitor for any worsening and need for surgery.  Appreciate vascular surgery input. Mild drainage of serosanguineous material around the groin.  Slightly purulent.  Cultures requested.  Continue broad-spectrum IV antibiotics. Wound consult requested by vascular surgery.  Appreciate input  2.Diabetes mellitus.  Hold metformin.  Sliding scale insulin and diabetic diet  3. COPD.  Has chronic shortness of breath but not needing oxygen.  Continue home nebulizers and inhalers.  4. Hypertension.  Continue home medications once available.  DVT prophylaxis with Lovenox   All the records are reviewed and case discussed with Care Management/Social Worker. Management plans discussed with the patient, family and they are in agreement.  CODE STATUS: Full  Code  TOTAL TIME TAKING CARE OF THIS PATIENT: 35 minutes.   More than 50% of the time was spent in  counseling/coordination of care: YES  POSSIBLE D/C IN 3 DAYS, DEPENDING ON CLINICAL CONDITION.   Husna Krone M.D on 06/28/2018 at 3:56 PM  Between 7am to 6pm - Pager - [phone]  After 6pm go to [email or web address] - Proofreader  [facility]  Office  [phone]  CC: Primary care physician; Theotis Burrow, MD  Note: This dictation was prepared with Dragon dictation along with smaller phrase technology. Any transcriptional errors that result from this process are unintentional.

## 2018-06-29 LAB — HIV ANTIBODY (ROUTINE TESTING W REFLEX): HIV Screen 4th Generation wRfx: NONREACTIVE

## 2018-06-29 LAB — BASIC METABOLIC PANEL
Anion gap: 13 (ref 5–15)
BUN: 13 mg/dL (ref 6–20)
CO2: 23 mmol/L (ref 22–32)
Calcium: 8.6 mg/dL — ABNORMAL LOW (ref 8.9–10.3)
Chloride: 103 mmol/L (ref 98–111)
Creatinine, Ser: 1.19 mg/dL (ref 0.61–1.24)
GFR calc Af Amer: >60 mL/min (ref >60)
GFR calc non Af Amer: >60 mL/min (ref >60)
Glucose, Bld: 100 mg/dL — ABNORMAL HIGH (ref 70–99)
Potassium: 3.7 mmol/L (ref 3.5–5.1)
Sodium: 139 mmol/L (ref 135–145)

## 2018-06-29 LAB — GLUCOSE, CAPILLARY
Glucose-Capillary: 92 mg/dL (ref 70–99)
Glucose-Capillary: 109 mg/dL — ABNORMAL HIGH (ref 70–99)
Glucose-Capillary: 120 mg/dL — ABNORMAL HIGH (ref 70–99)
Glucose-Capillary: 117 mg/dL — ABNORMAL HIGH (ref 70–99)

## 2018-06-29 LAB — CBC
HCT: 40.6 % (ref 39.0–52.0)
Hemoglobin: 12.9 g/dL — ABNORMAL LOW (ref 13.0–17.0)
MCH: 27.7 pg (ref 26.0–34.0)
MCHC: 31.8 g/dL (ref 30.0–36.0)
MCV: 87.1 fL (ref 80.0–100.0)
Platelets: 247 10*3/uL (ref 150–400)
RBC: 4.66 MIL/uL (ref 4.22–5.81)
RDW: 14.2 % (ref 11.5–15.5)
WBC: 8.2 10*3/uL (ref 4.0–10.5)
nRBC: 0 % (ref 0.0–0.2)

## 2018-06-29 LAB — MAGNESIUM: Magnesium: 2 mg/dL (ref 1.7–2.4)

## 2018-06-29 NOTE — Progress Notes (Signed)
[facility] [place] NAME: [name]    MR#:  [id number]  DATE OF BIRTH:  1965-04-17  SUBJECTIVE:  CHIEF COMPLAINT:   Chief Complaint  Patient presents with  . Wound Infection   No new complaint this morning.  No fevers. Patient sitting up in chair and having breakfast.  REVIEW OF SYSTEMS:  Review of Systems  Constitutional: Negative for chills and fever.  HENT: Negative for hearing loss and tinnitus.   Eyes: Negative for blurred vision and double vision.  Respiratory: Negative for cough and shortness of breath.   Cardiovascular: Negative for chest pain and palpitations.  Gastrointestinal: Negative for heartburn, nausea and vomiting.  Genitourinary: Negative for dysuria and urgency.  Musculoskeletal: Negative for myalgias and neck pain.  Skin: Negative for itching and rash.  Neurological: Negative for dizziness and headaches.  Psychiatric/Behavioral: Negative for depression and hallucinations.    DRUG ALLERGIES:   Allergies  Allergen Reactions  . Shellfish Allergy Swelling  . Codeine Rash  . Lisinopril Cough   VITALS:  Blood pressure 121/86, pulse 86, temperature 97.9 F (36.6 C), temperature source Oral, resp. rate 20, height 5\' 9"  (1.753 m), weight (!) 148.8 kg, SpO2 95 %. PHYSICAL EXAMINATION:  Physical Exam  GENERAL:  53 y.o.-year-old patient lying in the bed with no acute distress.  Obese EYES: Pupils equal, round, reactive to light and accommodation. No scleral icterus. Extraocular muscles intact.  HEENT: Head atraumatic, normocephalic. Oropharynx and nasopharynx clear. No oropharyngeal erythema, moist oral mucosa  NECK:  Supple, no jugular venous distention. No thyroid enlargement, no tenderness.  LUNGS: Good air entry.  Mild bilateral basal wheezing. CARDIOVASCULAR: S1, S2 normal. No murmurs, rubs, or gallops.  ABDOMEN: Soft, nontender, nondistended. Bowel sounds present. No organomegaly or mass. Mild  sero-sanguinous drainage from the groin significantly reduced. EXTREMITIES: Bilateral lower extremity redness extending up to the knees.  Has chronic stasis changes.  Weeping from his left calf area. NEUROLOGIC: Cranial nerves II through XII are intact. No focal Motor or sensory deficits appreciated b/l PSYCHIATRIC: The patient is alert and oriented x 3. Good affect.  SKIN: No obvious rash, lesion, or ulcer.   LABORATORY PANEL:  Male CBC Recent Labs  Lab 06/29/18 0317  WBC 8.2  HGB 12.9*  HCT 40.6  PLT 247   ------------------------------------------------------------------------------------------------------------------ Chemistries  Recent Labs  Lab 06/29/18 0317  NA 139  K 3.7  CL 103  CO2 23  GLUCOSE 100*  BUN 13  CREATININE 1.19  CALCIUM 8.6*  MG 2.0   RADIOLOGY:  No results found. ASSESSMENT AND PLAN:   1. Bilateral lower extremity cellulitis secondary to lymphedema.   Patient currently on broad-spectrum IV antibiotics with vancomycin and Zosyn as requested by his vascular surgery team.  Blood culture sent and pending.  Monitor for any worsening and need for surgery.  Appreciate vascular surgery input. Mild drainage of serosanguineous material around the groin significantly improved.  Follow-up on cultures. Continue broad-spectrum IV antibiotics. Patient seen by wound care nurse.  Unna boots applied bilaterally.  This will be changed q. weekly on Tuesdays.  Outpatient follow-up with vascular surgeon on discharge We will initiate de-escalation of antibiotics in a.m.  2.Diabetes mellitus.  Hold metformin.  Sliding scale insulin and diabetic diet  3. COPD.  Has chronic shortness of breath but not needing oxygen.  Continue home nebulizers and inhalers.  4. Hypertension.  Continue home medications once available.  DVT prophylaxis with Lovenox   All the  records are reviewed and case discussed with Care Management/Social Worker. Management plans discussed with  the patient, family and they are in agreement.  CODE STATUS: Full Code  TOTAL TIME TAKING CARE OF THIS PATIENT: 34 minutes.   More than 50% of the time was spent in counseling/coordination of care: YES  POSSIBLE D/C IN 3 DAYS, DEPENDING ON CLINICAL CONDITION.   Nicholas Trompeter M.D on 06/29/2018 at 10:41 AM  Between 7am to 6pm - Pager - [phone]  After 6pm go to [email or web address] - Proofreader  [facility]  Office  [phone]  CC: Primary care physician; Theotis Burrow, MD  Note: This dictation was prepared with Dragon dictation along with smaller phrase technology. Any transcriptional errors that result from this process are unintentional.

## 2018-06-29 NOTE — Consult Note (Signed)
Pharmacy Antibiotic Note  [name] is a 53 y.o. male admitted on 06/27/2018 with cellulitis.  Pharmacy has been consulted for Vancomycin/Zosyn dosing. Since admission he has been afebrile with no leukocytosis but SCr has increased today. This is day #3 of IV antibiotics. There is drainage from the wound that was sent for culture  Plan: 1) continue Zosyn 3.375g IV q8h (4 hour infusion)  2) stop vancomycin per Dr Stark Jock  Height: 5\' 9"  (175.3 cm) Weight: (!) 328 lb 0.7 oz (148.8 kg) IBW/kg (Calculated) : 70.7  Temp (24hrs), Avg:98.2 F (36.8 C), Min:97.9 F (36.6 C), Max:98.5 F (36.9 C)  Recent Labs  Lab 06/27/18 1239 06/28/18 0547 06/29/18 0317  WBC 8.5 9.0 8.2  CREATININE 0.99 0.91 1.19  LATICACIDVEN 2.7*  --   --     Estimated Creatinine Clearance: 104.7 mL/min (by C-G formula based on SCr of 1.19 mg/dL).    Antimicrobials this admission: Zosyn 6/15 >> Vancomycin 6/15 >>  Dose adjustments this admission: 6/17 vancomycin 1500 mg q12h--->1250 mg q12h  Microbiology results: 6/16 WCx pending 6/15 BCx: NGTD 6/15 SARS CoV-2 negative  Thank you for allowing pharmacy to be a part of this patient's care.  Dallie Piles, PharmD Clinical Pharmacist 06/29/2018 10:31 AM

## 2018-06-30 LAB — BASIC METABOLIC PANEL
Anion gap: 12 (ref 5–15)
BUN: 14 mg/dL (ref 6–20)
CO2: 27 mmol/L (ref 22–32)
Calcium: 8.8 mg/dL — ABNORMAL LOW (ref 8.9–10.3)
Chloride: 103 mmol/L (ref 98–111)
Creatinine, Ser: 1.27 mg/dL — ABNORMAL HIGH (ref 0.61–1.24)
GFR calc Af Amer: >60 mL/min (ref >60)
GFR calc non Af Amer: >60 mL/min (ref >60)
Glucose, Bld: 99 mg/dL (ref 70–99)
Potassium: 3.7 mmol/L (ref 3.5–5.1)
Sodium: 142 mmol/L (ref 135–145)

## 2018-06-30 LAB — GLUCOSE, CAPILLARY
Glucose-Capillary: 112 mg/dL — ABNORMAL HIGH (ref 70–99)
Glucose-Capillary: 87 mg/dL (ref 70–99)

## 2018-06-30 MED ORDER — CEFUROXIME AXETIL 250 MG PO TABS: 250.0000 mg | ORAL_TABLET | Freq: Two times a day (BID) | ORAL | 0 refills | Status: AC

## 2018-06-30 NOTE — Progress Notes (Signed)
[name] to be D/C'd Home per MD order.  Discussed prescriptions and follow up appointments with the patient. Prescriptions given to patient, medication list explained in detail. Pt verbalized understanding.  Allergies as of 06/30/2018      Reactions   Shellfish Allergy Swelling   Codeine Rash   Lisinopril Cough      Medication List    STOP taking these medications   doxycycline 100 MG capsule Commonly known as: VIBRAMYCIN   furosemide 40 MG tablet Commonly known as: LASIX   meloxicam 15 MG tablet Commonly known as: MOBIC     TAKE these medications   Advair Diskus 100-50 MCG/DOSE Aepb Generic drug: Fluticasone-Salmeterol Inhale 1 puff into the lungs 2 (two) times daily.   aspirin-acetaminophen-caffeine 371-696-78 MG tablet Commonly known as: EXCEDRIN MIGRAINE Take by mouth every 6 (six) hours as needed for headache.   atorvastatin 20 MG tablet Commonly known as: LIPITOR Take 20 mg by mouth at bedtime.   buPROPion 300 MG 24 hr tablet Commonly known as: WELLBUTRIN XL Take 300 mg by mouth daily.   calcium carbonate 600 MG Tabs tablet Commonly known as: Calcium 600 Take 1 tablet (600 mg total) by mouth 2 (two) times daily with a meal for 30 days.   cefUROXime 250 MG tablet Commonly known as: Ceftin Take 1 tablet (250 mg total) by mouth 2 (two) times daily for 5 days.   cholecalciferol 25 MCG (1000 UT) tablet Commonly known as: VITAMIN D3 Take 1 tablet (1,000 Units total) by mouth daily.   hydrochlorothiazide 12.5 MG capsule Commonly known as: MICROZIDE Take 12.5 mg by mouth daily.   loratadine 10 MG tablet Commonly known as: CLARITIN Take 10 mg by mouth daily.   losartan 50 MG tablet Commonly known as: COZAAR Take 1 tablet (50 mg total) by mouth daily.   Magnesium 500 MG Caps Take 1 capsule (500 mg total) by mouth 2 (two) times daily at 8 am and 10 pm.   metFORMIN 1000 MG tablet Commonly known as: GLUCOPHAGE Take 1,000 mg by mouth 2 (two) times  daily with a meal.   oxybutynin 5 MG tablet Commonly known as: DITROPAN Take 5 mg by mouth every 12 (twelve) hours.   pantoprazole 40 MG tablet Commonly known as: PROTONIX Take 40 mg by mouth 2 (two) times daily.   tiZANidine 4 MG tablet Commonly known as: Zanaflex Take 1 tablet (4 mg total) by mouth every 8 (eight) hours as needed for muscle spasms.   traMADol 50 MG tablet Commonly known as: Ultram Take 1 tablet (50 mg total) by mouth every 6 (six) hours as needed.   Ventolin HFA 108 (90 Base) MCG/ACT inhaler Generic drug: albuterol Inhale 2 puffs into the lungs every 4 (four) hours as needed for wheezing or shortness of breath.       Vitals:   06/30/18 0441 06/30/18 1212  BP: 132/65 134/76  Pulse: 96 81  Resp: 16   Temp: 98.5 F (36.9 C) 98.4 F (36.9 C)  SpO2: 98% 96%    Skin clean, dry and intact without evidence of skin break down, no evidence of skin tears noted. IV catheter discontinued intact. Site without signs and symptoms of complications. Dressing and pressure applied. Pt denies pain at this time. No complaints noted.  An After Visit Summary was printed and given to the patient. Patient escorted via [place], and D/C home via private auto.  Fuller Mandril, RN

## 2018-06-30 NOTE — Discharge Summary (Addendum)
[facility] NAME: [name]    MR#:  [id number]  DATE OF BIRTH:  04/23/1965  DATE OF ADMISSION:  06/27/2018   ADMITTING PHYSICIAN: Hillary Bow, MD  DATE OF DISCHARGE: 06/30/2018  PRIMARY CARE PHYSICIAN: Theotis Burrow, MD   ADMISSION DIAGNOSIS:  Cellulitis of left lower extremity [V78.588] DISCHARGE DIAGNOSIS:  Active Problems:   Bilateral lower leg cellulitis  SECONDARY DIAGNOSIS:   Past Medical History:  Diagnosis Date   COPD (chronic obstructive pulmonary disease) ([place])    Coronary artery disease    Diabetes mellitus without complication ([place])    Hypertension    Peptic ulcer    Sleep apnea    HOSPITAL COURSE:  Chief complaint; wound infection  History of presenting complaint; [name]  is a 53 y.o. male with a known history of hypertension, diabetes mellitus, COPD, sleep apnea, chronic lower extremity lymphedema and recurrent cellulitis who was being treated with doxycycline as outpatient presents to the emergency room due to worsening redness and pain.  Here patient has been evaluated by vascular surgery team.  Patient has cellulitis going up to the knees.    Patient was admitted for IV antibiotics treatment for cellulitis.  Failed outpatient p.o. antibiotics treatment.   Hospital course; 1. Bilateral lower extremity cellulitis secondary to lymphedema.  During the course of this admission patient was initially treated with IV vancomycin and Zosyn.  Significantly improved clinically.  Vancomycin later discontinued.  Patient being discharged on p.o. cefuroxime to complete treatment duration.  Patient failed outpatient treatment with p.o. doxycycline.  Patient seen by vascular surgery team.  Appreciate input.  Appointment already made to follow-up with vascular surgery team in 1 week. Mild drainage of serosanguineous material around the groin.  Resolved clinically with IV antibiotics as  mentioned above.  Seen by wound care team.  Patient had Unna boot applied to both lower extremities.  This to be changed by vascular surgery service on follow-up next week  2.Diabetes mellitus type II.  Resume home metformin.  Outpatient monitoring by primary care physician  3. COPD. Has chronic shortness of breath but not needing oxygen.  Stable  4. Hypertension.  Blood pressure controlled on current regimen.     DISCHARGE CONDITIONS:  Patient clinically stable.  Wishes to be discharged home today on oral antibiotics. CONSULTS OBTAINED:   DRUG ALLERGIES:   Allergies  Allergen Reactions   Shellfish Allergy Swelling   Codeine Rash   Lisinopril Cough   DISCHARGE MEDICATIONS:   Allergies as of 06/30/2018      Reactions   Shellfish Allergy Swelling   Codeine Rash   Lisinopril Cough      Medication List    STOP taking these medications   doxycycline 100 MG capsule Commonly known as: VIBRAMYCIN   furosemide 40 MG tablet Commonly known as: LASIX   meloxicam 15 MG tablet Commonly known as: MOBIC     TAKE these medications   Advair Diskus 100-50 MCG/DOSE Aepb Generic drug: Fluticasone-Salmeterol Inhale 1 puff into the lungs 2 (two) times daily.   aspirin-acetaminophen-caffeine [id number] MG tablet Commonly known as: EXCEDRIN MIGRAINE Take by mouth every 6 (six) hours as needed for headache.   atorvastatin 20 MG tablet Commonly known as: LIPITOR Take 20 mg by mouth at bedtime.   buPROPion 300 MG 24 hr tablet Commonly known as: WELLBUTRIN XL Take 300 mg by mouth daily.   calcium carbonate 600 MG Tabs tablet Commonly known as: Calcium 600  Take 1 tablet (600 mg total) by mouth 2 (two) times daily with a meal for 30 days.   cefUROXime 250 MG tablet Commonly known as: Ceftin Take 1 tablet (250 mg total) by mouth 2 (two) times daily for 5 days.   cholecalciferol 25 MCG (1000 UT) tablet Commonly known as: VITAMIN D3 Take 1 tablet (1,000 Units total) by  mouth daily.   hydrochlorothiazide 12.5 MG capsule Commonly known as: MICROZIDE Take 12.5 mg by mouth daily.   loratadine 10 MG tablet Commonly known as: CLARITIN Take 10 mg by mouth daily.   losartan 50 MG tablet Commonly known as: COZAAR Take 1 tablet (50 mg total) by mouth daily.   Magnesium 500 MG Caps Take 1 capsule (500 mg total) by mouth 2 (two) times daily at 8 am and 10 pm.   metFORMIN 1000 MG tablet Commonly known as: GLUCOPHAGE Take 1,000 mg by mouth 2 (two) times daily with a meal.   oxybutynin 5 MG tablet Commonly known as: DITROPAN Take 5 mg by mouth every 12 (twelve) hours.   pantoprazole 40 MG tablet Commonly known as: PROTONIX Take 40 mg by mouth 2 (two) times daily.   tiZANidine 4 MG tablet Commonly known as: Zanaflex Take 1 tablet (4 mg total) by mouth every 8 (eight) hours as needed for muscle spasms.   traMADol 50 MG tablet Commonly known as: Ultram Take 1 tablet (50 mg total) by mouth every 6 (six) hours as needed.   Ventolin HFA 108 (90 Base) MCG/ACT inhaler Generic drug: albuterol Inhale 2 puffs into the lungs every 4 (four) hours as needed for wheezing or shortness of breath.        DISCHARGE INSTRUCTIONS:   DIET:  Diabetic diet DISCHARGE CONDITION:  Stable ACTIVITY:  Activity as tolerated OXYGEN:  Home Oxygen: No.  Oxygen Delivery: room air DISCHARGE LOCATION:  home   If you experience worsening of your admission symptoms, develop shortness of breath, life threatening emergency, suicidal or homicidal thoughts you must seek medical attention immediately by calling 911 or calling your MD immediately  if symptoms less severe.  You Must read complete instructions/literature along with all the possible adverse reactions/side effects for all the Medicines you take and that have been prescribed to you. Take any new Medicines after you have completely understood and accpet all the possible adverse reactions/side effects.   Please  note  You were cared for by a hospitalist during your hospital stay. If you have any questions about your discharge medications or the care you received while you were in the hospital after you are discharged, you can call the unit and asked to speak with the hospitalist on call if the hospitalist that took care of you is not available. Once you are discharged, your primary care physician will handle any further medical issues. Please note that NO REFILLS for any discharge medications will be authorized once you are discharged, as it is imperative that you return to your primary care physician (or establish a relationship with a primary care physician if you do not have one) for your aftercare needs so that they can reassess your need for medications and monitor your lab values.    On the day of Discharge:  VITAL SIGNS:  Blood pressure 132/65, pulse 96, temperature 98.5 F (36.9 C), temperature source Oral, resp. rate 16, height 5\' 9"  (1.753 m), weight (!) 149.1 kg, SpO2 98 %. PHYSICAL EXAMINATION:  GENERAL:  53 y.o.-year-old patient lying in the bed with no acute  distress.  EYES: Pupils equal, round, reactive to light and accommodation. No scleral icterus. Extraocular muscles intact.  HEENT: Head atraumatic, normocephalic. Oropharynx and nasopharynx clear.  NECK:  Supple, no jugular venous distention. No thyroid enlargement, no tenderness.  LUNGS: Normal breath sounds bilaterally, no wheezing, rales,rhonchi or crepitation. No use of accessory muscles of respiration.  CARDIOVASCULAR: S1, S2 normal. No murmurs, rubs, or gallops.  ABDOMEN: Soft, non-tender, non-distended. Bowel sounds present. No organomegaly or mass.  EXTREMITIES: Unna boots applied to bilateral lower extremity.  No evidence of drainage or discharge from the groin this morning. NEUROLOGIC: Cranial nerves II through XII are intact. Muscle strength 5/5 in all extremities. Sensation intact. Gait not checked.  PSYCHIATRIC: The patient  is alert and oriented x 3.  SKIN: No obvious rash, lesion, or ulcer.  DATA REVIEW:   CBC Recent Labs  Lab 06/29/18 0317  WBC 8.2  HGB 12.9*  HCT 40.6  PLT 247    Chemistries  Recent Labs  Lab 06/29/18 0317 06/30/18 0309  NA 139 142  K 3.7 3.7  CL 103 103  CO2 23 27  GLUCOSE 100* 99  BUN 13 14  CREATININE 1.19 1.27*  CALCIUM 8.6* 8.8*  MG 2.0  --      Microbiology Results  Results for orders placed or performed during the hospital encounter of 06/27/18  Blood Culture (routine x 2)     Status: None (Preliminary result)   Collection Time: 06/27/18 12:39 PM   Specimen: BLOOD  Result Value Ref Range Status   Specimen Description BLOOD RIGHT ANTECUBITAL  Final   Special Requests   Final    BOTTLES DRAWN AEROBIC AND ANAEROBIC Blood Culture adequate volume   Culture   Final    NO GROWTH 3 DAYS Performed at [facility] [address] [place], [place] [address]    Report Status PENDING  Incomplete  Blood Culture (routine x 2)     Status: None (Preliminary result)   Collection Time: 06/27/18 12:39 PM   Specimen: BLOOD  Result Value Ref Range Status   Specimen Description BLOOD BLOOD LEFT FOREARM  Final   Special Requests   Final    BOTTLES DRAWN AEROBIC AND ANAEROBIC Blood Culture adequate volume   Culture   Final    NO GROWTH 3 DAYS Performed at [facility], [address] [place], [place] [address]    Report Status PENDING  Incomplete  Novel Coronavirus,NAA,(SEND-OUT TO REF LAB - TAT 24-48 hrs); Hosp Order     Status: None   Collection Time: 06/27/18 12:40 PM   Specimen: Nasopharyngeal Swab; Respiratory  Result Value Ref Range Status   SARS-CoV-2, NAA NOT DETECTED NOT DETECTED Final    Comment: (NOTE) This test was developed and its performance characteristics determined by [employer]. This test has not been FDA cleared or approved. This test has been authorized by FDA under an Emergency Use Authorization (EUA). This test is  only authorized for the duration of time the declaration that circumstances exist justifying the authorization of the emergency use of in vitro diagnostic tests for detection of SARS-CoV-2 virus and/or diagnosis of COVID-19 infection under section 564(b)(1) of the Act, 21 U.S.C. 235TDD-2(K)(0), unless the authorization is terminated or revoked sooner. When diagnostic testing is negative, the possibility of a false negative result should be considered in the context of a patient's recent exposures and the presence of clinical signs and symptoms consistent with COVID-19. An individual without symptoms of COVID-19 and who is  not shedding SARS-CoV-2 virus would expect to have a negative (not detected) result in this assay. Performed  At: [facility] [place], [place] [id number] Rush Farmer MD GO:[id number]    [place]  Final    Comment: Performed at [facility], [address] [place], [place] [address]    RADIOLOGY:  No results found.   Management plans discussed with the patient, family and they are in agreement.  CODE STATUS: Full Code   TOTAL TIME TAKING CARE OF THIS PATIENT: 37 minutes.    Casanova Schurman M.D on 06/30/2018 at 10:28 AM  Between 7am to 6pm - Pager - [phone]  After 6pm go to [email or web address] - Proofreader  [facility]  Office  [phone]  CC: Primary care physician; Theotis Burrow, MD   Note: This dictation was prepared with Dragon dictation along with smaller phrase technology. Any transcriptional errors that result from this process are unintentional.

## 2018-06-30 NOTE — Plan of Care (Signed)
Patient ambulating in room independently and voiding well.

## 2018-06-30 NOTE — Care Management Important Message (Signed)
Important Message  Patient Details  Name: [name] MRN: [id number] Date of Birth: December 06, 1965   Medicare Important Message Given:  Yes    Dannette Barbara 06/30/2018, 12:18 PM

## 2018-07-01 ENCOUNTER — Other Ambulatory Visit: Admission: RE | Admit: 2018-07-01 | Payer: Medicare Other | Source: Ambulatory Visit

## 2018-07-01 ENCOUNTER — Encounter (INDEPENDENT_AMBULATORY_CARE_PROVIDER_SITE_OTHER): Payer: Medicare Other

## 2018-07-02 LAB — CULTURE, BLOOD (ROUTINE X 2)
Culture: NO GROWTH
Culture: NO GROWTH
Special Requests: ADEQUATE
Special Requests: ADEQUATE

## 2018-07-05 ENCOUNTER — Ambulatory Visit: Payer: Medicaid Other | Admitting: Pain Medicine

## 2018-07-05 ENCOUNTER — Other Ambulatory Visit (INDEPENDENT_AMBULATORY_CARE_PROVIDER_SITE_OTHER): Payer: Self-pay | Admitting: Nurse Practitioner

## 2018-07-05 DIAGNOSIS — L03115 Cellulitis of right lower limb: Secondary | ICD-10-CM

## 2018-07-07 ENCOUNTER — Telehealth: Payer: Self-pay

## 2018-07-07 NOTE — Telephone Encounter (Signed)
Second call was placed to patient. 

## 2018-07-08 ENCOUNTER — Encounter: Payer: Self-pay | Admitting: Pain Medicine

## 2018-07-08 ENCOUNTER — Ambulatory Visit (INDEPENDENT_AMBULATORY_CARE_PROVIDER_SITE_OTHER): Payer: Medicare Other

## 2018-07-08 ENCOUNTER — Encounter (INDEPENDENT_AMBULATORY_CARE_PROVIDER_SITE_OTHER): Payer: Self-pay | Admitting: Nurse Practitioner

## 2018-07-08 ENCOUNTER — Ambulatory Visit (INDEPENDENT_AMBULATORY_CARE_PROVIDER_SITE_OTHER): Payer: Medicare Other | Admitting: Nurse Practitioner

## 2018-07-08 ENCOUNTER — Other Ambulatory Visit: Payer: Self-pay

## 2018-07-08 VITALS — BP 127/84 | HR 105 | Resp 14 | Ht 69.0 in | Wt 320.0 lb

## 2018-07-08 DIAGNOSIS — E785 Hyperlipidemia, unspecified: Secondary | ICD-10-CM

## 2018-07-08 DIAGNOSIS — I890 Lymphedema, not elsewhere classified: Secondary | ICD-10-CM | POA: Diagnosis not present

## 2018-07-08 DIAGNOSIS — F17200 Nicotine dependence, unspecified, uncomplicated: Secondary | ICD-10-CM

## 2018-07-08 DIAGNOSIS — L03115 Cellulitis of right lower limb: Secondary | ICD-10-CM | POA: Diagnosis not present

## 2018-07-08 DIAGNOSIS — L97221 Non-pressure chronic ulcer of left calf limited to breakdown of skin: Secondary | ICD-10-CM | POA: Diagnosis not present

## 2018-07-08 DIAGNOSIS — L03116 Cellulitis of left lower limb: Secondary | ICD-10-CM

## 2018-07-08 DIAGNOSIS — F17210 Nicotine dependence, cigarettes, uncomplicated: Secondary | ICD-10-CM

## 2018-07-08 NOTE — Progress Notes (Signed)
Patient states he was in the hospital last week for infection in his legs. He had a procedure scheduled last weak for LESI but had to cancel related to the infection and pain.

## 2018-07-08 NOTE — Progress Notes (Signed)
SUBJECTIVE:  Patient ID: [name], male    DOB: 06-Jul-1965, 53 y.o.   MRN: [id number] Chief Complaint  Patient presents with   Hospitalization Follow-up    HPI  [name] is a 53 y.o. male that presents today for Unna wrap evaluation as well as follow-up after [facility] hospitalization.  Patient was sent to the emergency room by my office on 06/27/2018 due to concerns for worsening cellulitis.  Patient saw Dr. dew on 06/17/2018 and he was given Augmentin for cellulitis.  However, when the patient came to have his Unna wraps changed on 06/24/2018 he stated that he had not yet begun taking these antibiotics.  At that time he was urged to continue taking.  On the 15th he contacted our office to let us know that his legs were weeping profusely and he had a leak through his Unna wraps, at which point he was urged to come to the office for evaluation.  At that time it was seen that his cellulitis had significantly progressed, however the patient stated that he did start the antibiotics on the Friday previously.  Patient also admits that at that time he was not using his lymphedema pump as prescribed.  Today, the patient appears to have resolved his lower extremity cellulitis greatly.  The wounds are no longer weeping profusely, they are dry at this time.  Most of his wounds are very nearly healed.  He denies any fever, chills, nausea, vomiting or diarrhea.  He denies any chest pain or shortness of breath.  There is still some persistent redness but not consistent with cellulitis.  The patient also had bilateral ABIs done today due to the concern that this may be peripheral arterial disease related due to the patient's history of tobacco use.  Today the patient's right ABI is 1.01 his left is 1.03.  His his bilateral tibial arteries have biphasic/triphasic waveforms.  He has strong toe waveforms bilaterally.  Past Medical History:  Diagnosis Date   COPD (chronic obstructive pulmonary disease)  ([place])    Coronary artery disease    Diabetes mellitus without complication ([place])    Hypertension    Peptic ulcer    Sleep apnea     Past Surgical History:  Procedure Laterality Date   COLONOSCOPY WITH PROPOFOL N/A 11/09/2016   Procedure: COLONOSCOPY WITH PROPOFOL;  Surgeon: Lollie Sails, MD;  Location: [facility] ENDOSCOPY;  Service: Endoscopy;  Laterality: N/A;   COLONOSCOPY WITH PROPOFOL N/A 07/06/2017   Procedure: COLONOSCOPY WITH PROPOFOL;  Surgeon: Lollie Sails, MD;  Location: [facility] ENDOSCOPY;  Service: Endoscopy;  Laterality: N/A;   ESOPHAGOGASTRODUODENOSCOPY (EGD) WITH PROPOFOL N/A 11/09/2016   Procedure: ESOPHAGOGASTRODUODENOSCOPY (EGD) WITH PROPOFOL;  Surgeon: Lollie Sails, MD;  Location: [facility] ENDOSCOPY;  Service: Endoscopy;  Laterality: N/A;   ESOPHAGOGASTRODUODENOSCOPY (EGD) WITH PROPOFOL N/A 07/06/2017   Procedure: ESOPHAGOGASTRODUODENOSCOPY (EGD) WITH PROPOFOL;  Surgeon: Lollie Sails, MD;  Location: [facility] ENDOSCOPY;  Service: Endoscopy;  Laterality: N/A;    Social History   Socioeconomic History   Marital status: Single    Spouse name: Not on file   Number of children: Not on file   Years of education: Not on file   Highest education level: Not on file  Occupational History   Not on file  Social Needs   Financial resource strain: Not on file   Food insecurity    Worry: Not on file    Inability: Not on file   Transportation needs    Medical:  Not on file    Non-medical: Not on file  Tobacco Use   Smoking status: Current Every Day Smoker    Packs/day: 1.00    Years: 30.00    Pack years: 30.00    Types: Cigarettes   Smokeless tobacco: Never Used   Tobacco comment: trying decrease  Substance and Sexual Activity   Alcohol use: No   Drug use: No   Sexual activity: Never  Lifestyle   Physical activity    Days per week: Not on file    Minutes per session: Not on file   Stress: Not on file  Relationships   Social  connections    Talks on phone: Not on file    Gets together: Not on file    Attends religious service: Not on file    Active member of club or organization: Not on file    Attends meetings of clubs or organizations: Not on file    Relationship status: Not on file   Intimate partner violence    Fear of current or ex partner: Not on file    Emotionally abused: Not on file    Physically abused: Not on file    Forced sexual activity: Not on file  Other Topics Concern   Not on file  Social History Narrative   Not on file    Family History  Problem Relation Age of Onset   Diabetes Mother    Diabetes Father    Stomach cancer Father     Allergies  Allergen Reactions   Shellfish Allergy Swelling   Codeine Rash   Lisinopril Cough     Review of Systems   Review of Systems: Negative Unless Checked Constitutional: [] Weight loss  [] Fever  [] Chills Cardiac: [] Chest pain   []  Atrial Fibrillation  [] Palpitations   [] Shortness of breath when laying flat   [] Shortness of breath with exertion. [] Shortness of breath at rest Vascular:  [] Pain in legs with walking   [] Pain in legs with standing [] Pain in legs when laying flat   [] Claudication    [] Pain in feet when laying flat    [] History of DVT   [] Phlebitis   [] Swelling in legs   [] Varicose veins   [] Non-healing ulcers Pulmonary:   [] Uses home oxygen   [] Productive cough   [] Hemoptysis   [] Wheeze  [] COPD   [] Asthma Neurologic:  [] Dizziness   [] Seizures  [] Blackouts [] History of stroke   [] History of TIA  [] Aphasia   [] Temporary Blindness   [] Weakness or numbness in arm   [] Weakness or numbness in leg Musculoskeletal:   [] Joint swelling   [] Joint pain   [] Low back pain  []  History of Knee Replacement [] Arthritis [] back Surgeries  []  Spinal Stenosis    Hematologic:  [] Easy bruising  [] Easy bleeding   [] Hypercoagulable state   [] Anemic Gastrointestinal:  [] Diarrhea   [] Vomiting  [] Gastroesophageal reflux/heartburn   [] Difficulty  swallowing. [] Abdominal pain Genitourinary:  [] Chronic kidney disease   [] Difficult urination  [] Anuric   [] Blood in urine [] Frequent urination  [] Burning with urination   [] Hematuria Skin:  [] Rashes   [] Ulcers [] Wounds Psychological:  [] History of anxiety   []  History of major depression  []  Memory Difficulties      OBJECTIVE:   Physical Exam  BP 127/84 (BP Location: Left Arm, Patient Position: Sitting, Cuff Size: Large)    Pulse (!) 105    Resp 14    Ht 5\' 9"  (1.753 m)    Wt (!) 320 lb (145.2 kg)  BMI 47.26 kg/m   Gen: WD/WN, NAD Head: [place]/AT, No temporalis wasting.  Ear/Nose/Throat: Hearing grossly intact, nares w/o erythema or drainage Eyes: PER, EOMI, sclera nonicteric.  Neck: Supple, no masses.  No JVD.  Pulmonary:  Good air movement, no use of accessory muscles.  Cardiac: RRR Vascular:  Vessel Right Left  Radial Palpable Palpable  Brachial Palpable Palpable  Femoral Palpable Palpable  Popliteal Palpable Palpable  Dorsalis Pedis Palpable Palpable  Posterior Tibial Palpable Palpable   Gastrointestinal: soft, non-distended. No guarding/no peritoneal signs.  Musculoskeletal: M/S 5/5 throughout.  No deformity or atrophy.  Neurologic: Pain and light touch intact in extremities.  Symmetrical.  Speech is fluent. Motor exam as listed above. Psychiatric: Judgment intact, Mood & affect appropriate for pt's clinical situation. Dermatologic: No Venous rashes. No Ulcers Noted.  No changes consistent with cellulitis. Lymph : No Cervical lymphadenopathy, no lichenification or skin changes of chronic lymphedema.       ASSESSMENT AND PLAN:  1. Bilateral lower leg cellulitis This appears to be resolved at this time.  2. Lymphedema  No surgery or intervention at this point in time.    I have reviewed my discussion with the patient regarding lymphedema and why it  causes symptoms.  Patient will continue wearing graduated compression stockings class 1 (20-30 mmHg) on a daily basis a  prescription was given. The patient is reminded to put the stockings on first thing in the morning and removing them in the evening. The patient is instructed specifically not to sleep in the stockings.   In addition, behavioral modification throughout the day will be continued.  This will include frequent elevation (such as in a recliner), use of over the counter pain medications as needed and exercise such as walking.  I have reviewed systemic causes for chronic edema such as liver, kidney and cardiac etiologies and there does not appear to be any significant changes in these organ systems over the past year.  The patient is under the impression that these organ systems are all stable and unchanged.    The patient will continue aggressive use of the  lymph pump.  This will continue to improve the edema control and prevent sequela such as ulcers and infections.   Again this was stressed to the patient.   3. Lower limb ulcer, calf, left, limited to breakdown of skin ([place]) No surgery or intervention at this point in time.     Patient will be placed in [employer] which will be changed weekly drainage permitting.  In addition, behavioral modification including several periods of elevation of the lower extremities during the day will be continued. Achieving a position with the ankles at heart level was stressed to the patient  The patient is instructed to begin routine exercise, especially walking on a daily basis   4. Tobacco dependence Smoking cessation was discussed, 3-10 minutes spent on this topic specifically   5. Hyperlipidemia, unspecified hyperlipidemia type Continue statin as ordered and reviewed, no changes at this time    Current Outpatient Medications on File Prior to Visit  Medication Sig Dispense Refill   albuterol (VENTOLIN HFA) 108 (90 Base) MCG/ACT inhaler Inhale 2 puffs into the lungs every 4 (four) hours as needed for wheezing or shortness of breath.       aspirin-acetaminophen-caffeine (EXCEDRIN MIGRAINE) 250-250-65 MG tablet Take by mouth every 6 (six) hours as needed for headache.     atorvastatin (LIPITOR) 20 MG tablet Take 20 mg by mouth at bedtime.  buPROPion (WELLBUTRIN XL) 300 MG 24 hr tablet Take 300 mg by mouth daily.     calcium carbonate (CALCIUM 600) 600 MG TABS tablet Take 1 tablet (600 mg total) by mouth 2 (two) times daily with a meal for 30 days. 60 tablet 0   cholecalciferol (VITAMIN D3) 25 MCG (1000 UT) tablet Take 1 tablet (1,000 Units total) by mouth daily. 30 tablet 5   Fluticasone-Salmeterol (ADVAIR DISKUS) 100-50 MCG/DOSE AEPB Inhale 1 puff into the lungs 2 (two) times daily.      hydrochlorothiazide (MICROZIDE) 12.5 MG capsule Take 12.5 mg by mouth daily.   0   loratadine (CLARITIN) 10 MG tablet Take 10 mg by mouth daily.     losartan (COZAAR) 50 MG tablet Take 1 tablet (50 mg total) by mouth daily. 30 tablet 0   Magnesium 500 MG CAPS Take 1 capsule (500 mg total) by mouth 2 (two) times daily at 8 am and 10 pm. 60 capsule 5   metFORMIN (GLUCOPHAGE) 1000 MG tablet Take 1,000 mg by mouth 2 (two) times daily with a meal.     oxybutynin (DITROPAN) 5 MG tablet Take 5 mg by mouth every 12 (twelve) hours.     pantoprazole (PROTONIX) 40 MG tablet Take 40 mg by mouth 2 (two) times daily.      tiZANidine (ZANAFLEX) 4 MG tablet Take 1 tablet (4 mg total) by mouth every 8 (eight) hours as needed for muscle spasms. 90 tablet 2   traMADol (ULTRAM) 50 MG tablet Take 1 tablet (50 mg total) by mouth every 6 (six) hours as needed. 20 tablet 0   No current facility-administered medications on file prior to visit.     There are no Patient Instructions on file for this visit. No follow-ups on file.   Kris Hartmann, NP  This note was completed with Sales executive.  Any errors are purely unintentional.

## 2018-07-11 ENCOUNTER — Ambulatory Visit: Payer: Medicare Other | Attending: Nurse Practitioner | Admitting: Pain Medicine

## 2018-07-11 ENCOUNTER — Other Ambulatory Visit: Payer: Self-pay

## 2018-07-11 ENCOUNTER — Encounter: Payer: Medicaid Other | Admitting: Nurse Practitioner

## 2018-07-11 DIAGNOSIS — G894 Chronic pain syndrome: Secondary | ICD-10-CM

## 2018-07-11 DIAGNOSIS — E559 Vitamin D deficiency, unspecified: Secondary | ICD-10-CM

## 2018-07-11 DIAGNOSIS — M5442 Lumbago with sciatica, left side: Secondary | ICD-10-CM

## 2018-07-11 DIAGNOSIS — M7918 Myalgia, other site: Secondary | ICD-10-CM

## 2018-07-11 DIAGNOSIS — M150 Primary generalized (osteo)arthritis: Secondary | ICD-10-CM

## 2018-07-11 DIAGNOSIS — M5441 Lumbago with sciatica, right side: Secondary | ICD-10-CM

## 2018-07-11 DIAGNOSIS — G8929 Other chronic pain: Secondary | ICD-10-CM

## 2018-07-11 DIAGNOSIS — M79604 Pain in right leg: Secondary | ICD-10-CM | POA: Diagnosis not present

## 2018-07-11 DIAGNOSIS — M79605 Pain in left leg: Secondary | ICD-10-CM

## 2018-07-11 DIAGNOSIS — M47816 Spondylosis without myelopathy or radiculopathy, lumbar region: Secondary | ICD-10-CM

## 2018-07-11 DIAGNOSIS — M159 Polyosteoarthritis, unspecified: Secondary | ICD-10-CM

## 2018-07-11 MED ORDER — MAGNESIUM 500 MG PO CAPS: ORAL_CAPSULE | ORAL | 0 refills | Status: DC

## 2018-07-11 MED ORDER — MELOXICAM 15 MG PO TABS: 15.0000 mg | ORAL_TABLET | Freq: Every day | ORAL | 0 refills | Status: DC

## 2018-07-11 MED ORDER — TIZANIDINE HCL 4 MG PO TABS: 4.0000 mg | ORAL_TABLET | Freq: Three times a day (TID) | ORAL | 0 refills | Status: DC | PRN

## 2018-07-11 MED ORDER — TRAMADOL HCL 50 MG PO TABS: 50.0000 mg | ORAL_TABLET | Freq: Four times a day (QID) | ORAL | 0 refills | Status: DC | PRN

## 2018-07-11 NOTE — Patient Instructions (Signed)
____________________________________________________________________________________________  Medication Rules  Purpose: To inform patients, and their family members, of our rules and regulations.  Applies to: All patients receiving prescriptions (written or electronic).  Pharmacy of record: Pharmacy where electronic prescriptions will be sent. If written prescriptions are taken to a different pharmacy, please inform the nursing staff. The pharmacy listed in the electronic medical record should be the one where you would like electronic prescriptions to be sent.  Electronic prescriptions: In compliance with the [place] Strengthen Opioid Misuse Prevention (STOP) Act of 2017 (Session Law 2017-74/H243), effective January 12, 2018, all controlled substances must be electronically prescribed. Calling prescriptions to the pharmacy will cease to exist.  Prescription refills: Only during scheduled appointments. Applies to all prescriptions.  NOTE: The following applies primarily to controlled substances (Opioid* Pain Medications).   Patient's responsibilities: 1. Pain Pills: Bring all pain pills to every appointment (except for procedure appointments). 2. Pill Bottles: Bring pills in original pharmacy bottle. Always bring the newest bottle. Bring bottle, even if empty. 3. Medication refills: You are responsible for knowing and keeping track of what medications you take and those you need refilled. The day before your appointment: write a list of all prescriptions that need to be refilled. The day of the appointment: give the list to the admitting nurse. Prescriptions will be written only during appointments. No prescriptions will be written on procedure days. If you forget a medication: it will not be "Called in", "Faxed", or "electronically sent". You will need to get another appointment to get these prescribed. No early refills. Do not call asking to have your prescription filled  early. 4. Prescription Accuracy: You are responsible for carefully inspecting your prescriptions before leaving our office. Have the discharge nurse carefully go over each prescription with you, before taking them home. Make sure that your name is accurately spelled, that your address is correct. Check the name and dose of your medication to make sure it is accurate. Check the number of pills, and the written instructions to make sure they are clear and accurate. Make sure that you are given enough medication to last until your next medication refill appointment. 5. Taking Medication: Take medication as prescribed. When it comes to controlled substances, taking less pills or less frequently than prescribed is permitted and encouraged. Never take more pills than instructed. Never take medication more frequently than prescribed.  6. Inform other Doctors: Always inform, all of your healthcare providers, of all the medications you take. 7. Pain Medication from other Providers: You are not allowed to accept any additional pain medication from any other Doctor or Healthcare provider. There are two exceptions to this rule. (see below) In the event that you require additional pain medication, you are responsible for notifying us, as stated below. 8. Medication Agreement: You are responsible for carefully reading and following our Medication Agreement. This must be signed before receiving any prescriptions from our practice. Safely store a copy of your signed Agreement. Violations to the Agreement will result in no further prescriptions. (Additional copies of our Medication Agreement are available upon request.) 9. Laws, Rules, & Regulations: All patients are expected to follow all Federal and State Laws, Statutes, Rules, & Regulations. Ignorance of the Laws does not constitute a valid excuse. The use of any illegal substances is prohibited. 10. Adopted CDC guidelines & recommendations: Target dosing levels will be  at or below 60 MME/day. Use of benzodiazepines** is not recommended.  Exceptions: There are only two exceptions to the rule of not   receiving pain medications from other Healthcare Providers. 1. Exception #1 (Emergencies): In the event of an emergency (i.e.: accident requiring emergency care), you are allowed to receive additional pain medication. However, you are responsible for: As soon as you are able, call our office [phone], at any time of the day or night, and leave a message stating your name, the date and nature of the emergency, and the name and dose of the medication prescribed. In the event that your call is answered by a member of our staff, make sure to document and save the date, time, and the name of the person that took your information.  2. Exception #2 (Planned Surgery): In the event that you are scheduled by another doctor or dentist to have any type of surgery or procedure, you are allowed (for a period no longer than 30 days), to receive additional pain medication, for the acute post-op pain. However, in this case, you are responsible for picking up a copy of our "Post-op Pain Management for Surgeons" handout, and giving it to your surgeon or dentist. This document is available at our office, and does not require an appointment to obtain it. Simply go to our office during business hours (Monday-Thursday from 8:00 AM to 4:00 PM) (Friday 8:00 AM to 12:00 Noon) or if you have a scheduled appointment with us, prior to your surgery, and ask for it by name. In addition, you will need to provide us with your name, name of your surgeon, type of surgery, and date of procedure or surgery.  *Opioid medications include: morphine, codeine, oxycodone, oxymorphone, hydrocodone, hydromorphone, meperidine, tramadol, tapentadol, buprenorphine, fentanyl, methadone. **Benzodiazepine medications include: diazepam (Valium), alprazolam (Xanax), clonazepam (Klonopine), lorazepam (Ativan), clorazepate  (Tranxene), chlordiazepoxide (Librium), estazolam (Prosom), oxazepam (Serax), temazepam (Restoril), triazolam (Halcion) (Last updated: 03/11/2017) ____________________________________________________________________________________________   ____________________________________________________________________________________________  Medication Recommendations and Reminders  Applies to: All patients receiving prescriptions (written and/or electronic).  Medication Rules & Regulations: These rules and regulations exist for your safety and that of others. They are not flexible and neither are we. Dismissing or ignoring them will be considered "non-compliance" with medication therapy, resulting in complete and irreversible termination of such therapy. (See document titled "Medication Rules" for more details.) In all conscience, because of safety reasons, we cannot continue providing a therapy where the patient does not follow instructions.  Pharmacy of record:   Definition: This is the pharmacy where your electronic prescriptions will be sent.   We do not endorse any particular pharmacy.  You are not restricted in your choice of pharmacy.  The pharmacy listed in the electronic medical record should be the one where you want electronic prescriptions to be sent.  If you choose to change pharmacy, simply notify our nursing staff of your choice of new pharmacy.  Recommendations:  Keep all of your pain medications in a safe place, under lock and key, even if you live alone.   After you fill your prescription, take 1 week's worth of pills and put them away in a safe place. You should keep a separate, properly labeled bottle for this purpose. The remainder should be kept in the original bottle. Use this as your primary supply, until it runs out. Once it's gone, then you know that you have 1 week's worth of medicine, and it is time to come in for a prescription refill. If you do this correctly, it  is unlikely that you will ever run out of medicine.  To make sure that the above recommendation works,   it is very important that you make sure your medication refill appointments are scheduled at least 1 week before you run out of medicine. To do this in an effective manner, make sure that you do not leave the office without scheduling your next medication management appointment. Always ask the nursing staff to show you in your prescription , when your medication will be running out. Then arrange for the receptionist to get you a return appointment, at least 7 days before you run out of medicine. Do not wait until you have 1 or 2 pills left, to come in. This is very poor planning and does not take into consideration that we may need to cancel appointments due to bad weather, sickness, or emergencies affecting our staff.  "Partial Fill": If for any reason your pharmacy does not have enough pills/tablets to completely fill or refill your prescription, do not allow for a "partial fill". You will need a separate prescription to fill the remaining amount, which we will not provide. If the reason for the partial fill is your insurance, you will need to talk to the pharmacist about payment alternatives for the remaining tablets, but again, do not accept a partial fill.  Prescription refills and/or changes in medication(s):   Prescription refills, and/or changes in dose or medication, will be conducted only during scheduled medication management appointments. (Applies to both, written and electronic prescriptions.)  No refills on procedure days. No medication will be changed or started on procedure days. No changes, adjustments, and/or refills will be conducted on a procedure day. Doing so will interfere with the diagnostic portion of the procedure.  No phone refills. No medications will be "called into the pharmacy".  No Fax refills.  No weekend refills.  No Holliday refills.  No after hours  refills.  Remember:  Business hours are:  Monday to Thursday 8:00 AM to 4:00 PM Provider's Schedule: Crystal King, NP - Appointments are:  Medication management: Monday to Thursday 8:00 AM to 4:00 PM Susette Seminara, MD - Appointments are:  Medication management: Monday and Wednesday 8:00 AM to 4:00 PM Procedure day: Tuesday and Thursday 7:30 AM to 4:00 PM Bilal Lateef, MD - Appointments are:  Medication management: Tuesday and Thursday 8:00 AM to 4:00 PM Procedure day: Monday and Wednesday 7:30 AM to 4:00 PM (Last update: 03/11/2017) ____________________________________________________________________________________________   ____________________________________________________________________________________________  CANNABIDIOL (AKA: CBD Oil or Pills)  Applies to: All patients receiving prescriptions of controlled substances (written and/or electronic).  General Information: Cannabidiol (CBD) was discovered in 1940. It is one of some 113 identified cannabinoids in cannabis (Marijuana) plants, accounting for up to 40% of the plant's extract. As of 2018, preliminary clinical research on cannabidiol included studies of anxiety, cognition, movement disorders, and pain.  Cannabidiol is consummed in multiple ways, including inhalation of cannabis smoke or vapor, as an aerosol spray into the cheek, and by mouth. It may be supplied as CBD oil containing CBD as the active ingredient (no added tetrahydrocannabinol (THC) or terpenes), a full-plant CBD-dominant hemp extract oil, capsules, dried cannabis, or as a liquid solution. CBD is thought not have the same psychoactivity as THC, and may affect the actions of THC. Studies suggest that CBD may interact with different biological targets, including cannabinoid receptors and other neurotransmitter receptors. As of 2018 the mechanism of action for its biological effects has not been determined.  In the [place], cannabidiol has a limited  approval by the Food and Drug Administration (FDA) for treatment of only two types   of epilepsy disorders. The side effects of long-term use of the drug include somnolence, decreased appetite, diarrhea, fatigue, malaise, weakness, sleeping problems, and others.  CBD remains a Schedule I drug prohibited for any use.  Legality: Some manufacturers ship CBD products nationally, an illegal action which the FDA has not enforced in 2018, with CBD remaining the subject of an FDA investigational new drug evaluation, and is not considered legal as a dietary supplement or food ingredient as of December 2018. Federal illegality has made it difficult historically to conduct research on CBD. CBD is openly sold in head shops and health food stores in some states where such sales have not been explicitly legalized.  Warning: Because it is not FDA approved for general use or treatment of pain, it is not required to undergo the same manufacturing controls as prescription drugs.  This means that the available cannabidiol (CBD) may be contaminated with THC.  If this is the case, it will trigger a positive urine drug screen (UDS) test for cannabinoids (Marijuana).  Because a positive UDS for illicit substances is a violation of our medication agreement, your opioid analgesics (pain medicine) may be permanently discontinued. (Last update: 04/01/2017) ____________________________________________________________________________________________    

## 2018-07-11 NOTE — Progress Notes (Signed)
Pain Management Virtual Encounter Note - Virtual Visit via Telephone Telehealth (real-time audio visits between healthcare provider and patient).   Patient's Phone No. & Preferred Pharmacy:  [phone] (home); [phone] (mobile); (Preferred) [phone] [email or web address]  [place] [place] [address] Phone: [phone] Fax: [phone]    Pre-screening note:  Our staff contacted [name] and offered him an "in person", "face-to-face" appointment versus a telephone encounter. He indicated preferring the telephone encounter, at this time.   Reason for Virtual Visit: COVID-19*  Social distancing based on CDC and AMA recommendations.   I contacted [name] on 07/11/2018 via telephone.      I clearly identified myself as Gaspar Cola, MD. I verified that I was speaking with the correct person using two identifiers (Name: [name], and date of birth: 1965-12-31).  Advanced Informed Consent I sought verbal advanced consent from [name] for virtual visit interactions. I informed [name] of possible security and privacy concerns, risks, and limitations associated with providing "not-in-person" medical evaluation and management services. I also informed Mr. Mondry of the availability of "in-person" appointments. Finally, I informed him that there would be a charge for the virtual visit and that he could be  personally, fully or partially, financially responsible for it. [name] expressed understanding and agreed to proceed.   Historic Elements   [name] is a 53 y.o. year old, male patient evaluated today after his last encounter by our practice on 07/07/2018. [name]  has a past medical history of COPD (chronic obstructive pulmonary disease) ([place]), Coronary artery disease, Diabetes mellitus without complication ([place]), Hypertension, Peptic ulcer, and Sleep apnea.  He also  has a past surgical history that includes Esophagogastroduodenoscopy (egd) with propofol (N/A, 11/09/2016); Colonoscopy with propofol (N/A, 11/09/2016); Colonoscopy with propofol (N/A, 07/06/2017); and Esophagogastroduodenoscopy (egd) with propofol (N/A, 07/06/2017). Mr. Dettmer has a current medication list which includes the following prescription(s): albuterol, aspirin-acetaminophen-caffeine, atorvastatin, bupropion, cholecalciferol, fluticasone-salmeterol, hydrochlorothiazide, loratadine, losartan, magnesium, metformin, oxybutynin, pantoprazole, tizanidine, tramadol, calcium carbonate, and meloxicam. He  reports that he has been smoking cigarettes. He has a 30.00 pack-year smoking history. He has never used smokeless tobacco. He reports that he does not drink alcohol or use drugs. [name] is allergic to shellfish allergy; codeine; and lisinopril.   HPI  Today, he is being contacted for medication management.  The patient indicates not having any type of problems with the medications.  He was recently hospitalized due to lower extremity infections.  He still being treated for that.  We will not be doing any type of interventional therapies until he fully heals.  Pharmacotherapy Assessment  Analgesic: Tramadol 50 mg 1 tablet p.o. every 6 hours (200 mg/day of tramadol) MME/day: 20 mg/day.   Monitoring: Pharmacotherapy: No side-effects or adverse reactions reported. [place] PMP: PDMP reviewed during this encounter.       Compliance: No problems identified. Effectiveness: Clinically acceptable. Plan: Refer to "POC".  Pertinent Labs   SAFETY SCREENING Profile Lab Results  Component Value Date   SARSCOV2NAA NOT DETECTED 06/27/2018   COVIDSOURCE NASOPHARYNGEAL 06/27/2018   MRSAPCR NEGATIVE 01/06/2016   HIV Non Reactive 06/28/2018   Renal Function Lab Results  Component Value Date   BUN 14 06/30/2018   CREATININE 1.27 (H) 06/30/2018   BCR 9 10/07/2017   GFRAA >60 06/30/2018   GFRNONAA  >60 06/30/2018   Hepatic Function Lab Results  Component Value Date  AST 21 10/15/2017   ALT 23 10/15/2017   ALBUMIN 4.0 10/15/2017   UDS Summary  Date Value Ref Range Status  10/07/2017 FINAL  Final    Comment:    ==================================================================== TOXASSURE COMP DRUG ANALYSIS,UR ==================================================================== Test                             Result       Flag       Units Drug Present and Declared for Prescription Verification   Bupropion                      PRESENT      EXPECTED   Hydroxybupropion               PRESENT      EXPECTED    Hydroxybupropion is an expected metabolite of bupropion. Drug Present not Declared for Prescription Verification   Acetaminophen                  PRESENT      UNEXPECTED ==================================================================== Test                      Result    Flag   Units      Ref Range   Creatinine              157              mg/dL      >=20 ==================================================================== Declared Medications:  The flagging and interpretation on this report are based on the  following declared medications.  Unexpected results may arise from  inaccuracies in the declared medications.  **Note: The testing scope of this panel includes these medications:  Bupropion  **Note: The testing scope of this panel does not include following  reported medications:  Albuterol  Atorvastatin  Fluticasone (Advair)  Furosemide (Lasix)  Hydrochlorothiazide (Microzide)  Loratadine (Claritin)  Losartan (Cozaar)  Metformin  Oxybutynin (Ditropan)  Pantoprazole (Protonix)  Salmeterol (Advair)  Tiotropium (Spiriva) ==================================================================== For clinical consultation, please call [phone]. ====================================================================    Note: Above Lab results  reviewed.  Recent imaging  US Venous Img Lower Unilateral Left CLINICAL DATA:  Pain, redness, swelling.  EXAM: LEFT LOWER EXTREMITY VENOUS DOPPLER ULTRASOUND  TECHNIQUE: Gray-scale sonography with graded compression, as well as color Doppler and duplex ultrasound were performed to evaluate the lower extremity deep venous systems from the level of the common femoral vein and including the common femoral, femoral, profunda femoral, popliteal and calf veins including the posterior tibial, peroneal and gastrocnemius veins when visible. The superficial great saphenous vein was also interrogated. Spectral Doppler was utilized to evaluate flow at rest and with distal augmentation maneuvers in the common femoral, femoral and popliteal veins.  COMPARISON:  06/27/2018.  FINDINGS: Contralateral Common Femoral Vein: Respiratory phasicity is normal and symmetric with the symptomatic side. No evidence of thrombus. Normal compressibility.  Common Femoral Vein: No evidence of thrombus. Normal compressibility, respiratory phasicity and response to augmentation.  Saphenofemoral Junction: No evidence of thrombus. Normal compressibility and flow on color Doppler imaging.  Profunda Femoral Vein: No evidence of thrombus. Normal compressibility and flow on color Doppler imaging.  Femoral Vein: No evidence of thrombus. Normal compressibility, respiratory phasicity and response to augmentation.  Popliteal Vein: No evidence of thrombus. Normal compressibility, respiratory phasicity and response to augmentation.  Calf Veins: No evidence of thrombus. Normal compressibility and flow on  color Doppler imaging. Limited visibility due to patient's body habitus.  Superficial Great Saphenous Vein: No evidence of thrombus. Normal compressibility.  Other Findings:  None.  IMPRESSION: No evidence of deep venous thrombosis.  Electronically Signed   By: [place]   On: 06/27/2018 14:15 DG  Tibia/Fibula Left CLINICAL DATA:  History of cellulitis.  EXAM: LEFT TIBIA AND FIBULA - 2 VIEW  COMPARISON:  No recent.  FINDINGS: Diffuse soft tissue swelling. No acute bony or joint abnormality identified. No evidence of fracture or dislocation.  IMPRESSION: Diffuse soft tissue swelling.  No acute bony abnormality.  Electronically Signed   By: Marcello Moores  Register   On: 06/27/2018 12:53  Assessment  The primary encounter diagnosis was Chronic pain syndrome. Diagnoses of Chronic low back pain (Primary Area of Pain) (Bilateral) (R>L) w/ sciatica (Bilateral), Chronic lower extremity pain (Secondary Area of Pain) (Bilateral) (R>L), Chronic musculoskeletal pain, Osteoarthritis of facet joint of lumbar spine, Osteoarthritis involving multiple joints, and Vitamin D insufficiency were also pertinent to this visit.  Plan of Care  I have changed Wendie Chess. Crosby's tiZANidine, meloxicam, Magnesium, and traMADol. I am also having him maintain his loratadine, atorvastatin, Fluticasone-Salmeterol, losartan, hydrochlorothiazide, metFORMIN, pantoprazole, albuterol, oxybutynin, aspirin-acetaminophen-caffeine, calcium carbonate, cholecalciferol, and buPROPion.  Pharmacotherapy (Medications Ordered): Meds ordered this encounter  Medications  . tiZANidine (ZANAFLEX) 4 MG tablet    Sig: Take 1 tablet (4 mg total) by mouth every 8 (eight) hours as needed for up to 30 days for muscle spasms.    Dispense:  90 tablet    Refill:  0    Fill one day early if pharmacy is closed on scheduled refill date. May substitute for generic if available.  . meloxicam (MOBIC) 15 MG tablet    Sig: Take 1 tablet (15 mg total) by mouth daily for 30 days.    Dispense:  30 tablet    Refill:  0    Fill one day early if pharmacy is closed on scheduled refill date. May substitute for generic if available.  . Magnesium 500 MG CAPS    Sig: Take 1 cap (500 mg) PO HS and in AM    Dispense:  60 capsule    Refill:  0    Fill one  day early if pharmacy is closed on scheduled refill date. May substitute for generic if available.  . traMADol (ULTRAM) 50 MG tablet    Sig: Take 1 tablet (50 mg total) by mouth every 6 (six) hours as needed for up to 30 days for severe pain. Must last 30 days Max: 4/day    Dispense:  120 tablet    Refill:  0    Chronic Pain: STOP Act (Not applicable) Fill 1 day early if closed on refill date. Do not fill until: 07/11/2018. To last until: 08/10/2018. Avoid benzodiazepines within 8 hours of opioids   Orders:  No orders of the defined types were placed in this encounter.  Follow-up plan:   Return in about 1 month (around 08/10/2018) for (VV), E/M (MM).    Recent Visits No visits were found meeting these conditions.  Showing recent visits within past 90 days and meeting all other requirements   Today's Visits Date Type Provider Dept  07/11/18 Office Visit Milinda Pointer, MD [facility]  Showing today's visits and meeting all other requirements   Future Appointments No visits were found meeting these conditions.  Showing future appointments within next 90 days and meeting all other requirements   I discussed  the assessment and treatment plan with the patient. The patient was provided an opportunity to ask questions and all were answered. The patient agreed with the plan and demonstrated an understanding of the instructions.  Patient advised to call back or seek an in-person evaluation if the symptoms or condition worsens.  Total duration of non-face-to-face encounter: 12 minutes.  Note by: Gaspar Cola, MD Date: 07/11/2018; Time: 5:04 PM  Note: This dictation was prepared with Dragon dictation. Any transcriptional errors that may result from this process are unintentional.  Disclaimer:  * Given the special circumstances of the COVID-19 pandemic, the federal government has announced that the Office for Civil Rights (OCR) will exercise its enforcement discretion and  will not impose penalties on physicians using telehealth in the event of noncompliance with regulatory requirements under the [place] (HIPAA) in connection with the good faith provision of telehealth during the EFEOF-12 national public health emergency. ([place])

## 2018-07-14 ENCOUNTER — Encounter (INDEPENDENT_AMBULATORY_CARE_PROVIDER_SITE_OTHER): Payer: Self-pay | Admitting: Nurse Practitioner

## 2018-07-14 ENCOUNTER — Ambulatory Visit (INDEPENDENT_AMBULATORY_CARE_PROVIDER_SITE_OTHER): Payer: Medicare Other | Admitting: Nurse Practitioner

## 2018-07-14 ENCOUNTER — Other Ambulatory Visit: Payer: Self-pay

## 2018-07-14 VITALS — BP 133/79 | HR 112 | Resp 18 | Ht 69.0 in | Wt 327.0 lb

## 2018-07-14 DIAGNOSIS — L97322 Non-pressure chronic ulcer of left ankle with fat layer exposed: Secondary | ICD-10-CM

## 2018-07-14 DIAGNOSIS — L97919 Non-pressure chronic ulcer of unspecified part of right lower leg with unspecified severity: Secondary | ICD-10-CM | POA: Diagnosis not present

## 2018-07-14 NOTE — Progress Notes (Signed)
History of Present Illness  There is no documented history at this time  Assessments & Plan   There are no diagnoses linked to this encounter.    Additional instructions  Subjective:  Patient presents with venous ulcer of the Bilateral lower extremity.    Procedure:  3 layer unna wrap was placed Bilateral lower extremity.   Plan:   Follow up in one week.  

## 2018-07-21 ENCOUNTER — Ambulatory Visit (INDEPENDENT_AMBULATORY_CARE_PROVIDER_SITE_OTHER): Payer: Medicare Other | Admitting: Nurse Practitioner

## 2018-07-21 ENCOUNTER — Encounter (INDEPENDENT_AMBULATORY_CARE_PROVIDER_SITE_OTHER): Payer: Self-pay

## 2018-07-21 ENCOUNTER — Other Ambulatory Visit: Payer: Self-pay

## 2018-07-21 VITALS — BP 148/89 | HR 101 | Resp 18 | Wt 331.0 lb

## 2018-07-21 DIAGNOSIS — L97221 Non-pressure chronic ulcer of left calf limited to breakdown of skin: Secondary | ICD-10-CM | POA: Diagnosis not present

## 2018-07-21 DIAGNOSIS — L97919 Non-pressure chronic ulcer of unspecified part of right lower leg with unspecified severity: Secondary | ICD-10-CM | POA: Diagnosis not present

## 2018-07-21 NOTE — Progress Notes (Signed)
History of Present Illness  There is no documented history at this time  Assessments & Plan   There are no diagnoses linked to this encounter.    Additional instructions  Subjective:  Patient presents with venous ulcer of the Bilateral lower extremity.    Procedure:  3 layer unna wrap was placed Bilateral lower extremity.   Plan:   Follow up in one week.  

## 2018-07-28 ENCOUNTER — Encounter (INDEPENDENT_AMBULATORY_CARE_PROVIDER_SITE_OTHER): Payer: Self-pay | Admitting: Nurse Practitioner

## 2018-07-28 ENCOUNTER — Other Ambulatory Visit: Payer: Self-pay

## 2018-07-28 ENCOUNTER — Ambulatory Visit (INDEPENDENT_AMBULATORY_CARE_PROVIDER_SITE_OTHER): Payer: Medicare Other | Admitting: Nurse Practitioner

## 2018-07-28 VITALS — BP 146/85 | HR 106 | Resp 18 | Ht 69.0 in | Wt 333.0 lb

## 2018-07-28 DIAGNOSIS — L97221 Non-pressure chronic ulcer of left calf limited to breakdown of skin: Secondary | ICD-10-CM | POA: Diagnosis not present

## 2018-07-28 DIAGNOSIS — L97919 Non-pressure chronic ulcer of unspecified part of right lower leg with unspecified severity: Secondary | ICD-10-CM | POA: Diagnosis not present

## 2018-07-28 NOTE — Progress Notes (Signed)
History of Present Illness  There is no documented history at this time  Assessments & Plan   There are no diagnoses linked to this encounter.    Additional instructions  Subjective:  Patient presents with venous ulcer of the Bilateral lower extremity.    Procedure:  3 layer unna wrap was placed Bilateral lower extremity.   Plan:   Follow up in one week.  

## 2018-08-05 ENCOUNTER — Ambulatory Visit (INDEPENDENT_AMBULATORY_CARE_PROVIDER_SITE_OTHER): Payer: Medicare Other | Admitting: Vascular Surgery

## 2018-08-05 ENCOUNTER — Other Ambulatory Visit: Payer: Self-pay

## 2018-08-05 VITALS — BP 134/87 | HR 102 | Resp 16 | Ht 69.0 in | Wt 332.0 lb

## 2018-08-05 DIAGNOSIS — I10 Essential (primary) hypertension: Secondary | ICD-10-CM

## 2018-08-05 DIAGNOSIS — L97221 Non-pressure chronic ulcer of left calf limited to breakdown of skin: Secondary | ICD-10-CM

## 2018-08-05 DIAGNOSIS — F17210 Nicotine dependence, cigarettes, uncomplicated: Secondary | ICD-10-CM

## 2018-08-05 DIAGNOSIS — Z6841 Body Mass Index (BMI) 40.0 and over, adult: Secondary | ICD-10-CM

## 2018-08-05 DIAGNOSIS — E119 Type 2 diabetes mellitus without complications: Secondary | ICD-10-CM | POA: Diagnosis not present

## 2018-08-05 DIAGNOSIS — I890 Lymphedema, not elsewhere classified: Secondary | ICD-10-CM

## 2018-08-05 NOTE — Progress Notes (Signed)
MRN : [id number]  [name] is a 53 y.o. (1965/10/04) male who presents with chief complaint of No chief complaint on file. Marland Kitchen  History of Present Illness: Patient returns today in follow up of his lower extremity swelling, cellulitis, and ulceration.  He was treated for cellulitis and this is markedly improved.  His swelling is under pretty good control with Unna boots.  His skin has almost completely healed with only a small ulceration on the right medial calf and a couple of scabs on the left leg.  His legs feel better and not hurting as much. No fever or chills  Current Outpatient Medications  Medication Sig Dispense Refill   albuterol (VENTOLIN HFA) 108 (90 Base) MCG/ACT inhaler Inhale 2 puffs into the lungs every 4 (four) hours as needed for wheezing or shortness of breath.      aspirin-acetaminophen-caffeine (EXCEDRIN MIGRAINE) 250-250-65 MG tablet Take by mouth every 6 (six) hours as needed for headache.     atorvastatin (LIPITOR) 20 MG tablet Take 20 mg by mouth at bedtime.      buPROPion (WELLBUTRIN XL) 300 MG 24 hr tablet Take 300 mg by mouth daily.     calcium carbonate (CALCIUM 600) 600 MG TABS tablet Take 1 tablet (600 mg total) by mouth 2 (two) times daily with a meal for 30 days. 60 tablet 0   cholecalciferol (VITAMIN D3) 25 MCG (1000 UT) tablet Take 1 tablet (1,000 Units total) by mouth daily. 30 tablet 5   Fluticasone-Salmeterol (ADVAIR DISKUS) 100-50 MCG/DOSE AEPB Inhale 1 puff into the lungs 2 (two) times daily.      hydrochlorothiazide (MICROZIDE) 12.5 MG capsule Take 12.5 mg by mouth daily.   0   loratadine (CLARITIN) 10 MG tablet Take 10 mg by mouth daily.     losartan (COZAAR) 50 MG tablet Take 1 tablet (50 mg total) by mouth daily. 30 tablet 0   Magnesium 500 MG CAPS Take 1 cap (500 mg) PO HS and in AM 60 capsule 0   meloxicam (MOBIC) 15 MG tablet Take 1 tablet (15 mg total) by mouth daily for 30 days. 30 tablet 0   metFORMIN (GLUCOPHAGE) 1000 MG  tablet Take 1,000 mg by mouth 2 (two) times daily with a meal.     oxybutynin (DITROPAN) 5 MG tablet Take 5 mg by mouth every 12 (twelve) hours.     pantoprazole (PROTONIX) 40 MG tablet Take 40 mg by mouth 2 (two) times daily.      tiZANidine (ZANAFLEX) 4 MG tablet Take 1 tablet (4 mg total) by mouth every 8 (eight) hours as needed for up to 30 days for muscle spasms. 90 tablet 0   traMADol (ULTRAM) 50 MG tablet Take 1 tablet (50 mg total) by mouth every 6 (six) hours as needed for up to 30 days for severe pain. Must last 30 days Max: 4/day 120 tablet 0   No current facility-administered medications for this visit.     Past Medical History:  Diagnosis Date   COPD (chronic obstructive pulmonary disease) ([place])    Coronary artery disease    Diabetes mellitus without complication ([place])    Hypertension    Peptic ulcer    Sleep apnea     Past Surgical History:  Procedure Laterality Date   COLONOSCOPY WITH PROPOFOL N/A 11/09/2016   Procedure: COLONOSCOPY WITH PROPOFOL;  Surgeon: Lollie Sails, MD;  Location: [facility] ENDOSCOPY;  Service: Endoscopy;  Laterality: N/A;   COLONOSCOPY WITH PROPOFOL N/A 07/06/2017  Procedure: COLONOSCOPY WITH PROPOFOL;  Surgeon: Lollie Sails, MD;  Location: [facility] ENDOSCOPY;  Service: Endoscopy;  Laterality: N/A;   ESOPHAGOGASTRODUODENOSCOPY (EGD) WITH PROPOFOL N/A 11/09/2016   Procedure: ESOPHAGOGASTRODUODENOSCOPY (EGD) WITH PROPOFOL;  Surgeon: Lollie Sails, MD;  Location: [facility] ENDOSCOPY;  Service: Endoscopy;  Laterality: N/A;   ESOPHAGOGASTRODUODENOSCOPY (EGD) WITH PROPOFOL N/A 07/06/2017   Procedure: ESOPHAGOGASTRODUODENOSCOPY (EGD) WITH PROPOFOL;  Surgeon: Lollie Sails, MD;  Location: [facility] ENDOSCOPY;  Service: Endoscopy;  Laterality: N/A;    Social History Social History   Tobacco Use   Smoking status: Current Every Day Smoker    Packs/day: 1.00    Years: 30.00    Pack years: 30.00    Types: Cigarettes   Smokeless  tobacco: Never Used   Tobacco comment: trying decrease  Substance Use Topics   Alcohol use: No   Drug use: No     Family History Family History  Problem Relation Age of Onset   Diabetes Mother    Diabetes Father    Stomach cancer Father     Allergies  Allergen Reactions   Shellfish Allergy Swelling   Codeine Rash   Lisinopril Cough     REVIEW OF SYSTEMS(Negative unless checked)  Constitutional: [] ????Weight loss[] ????Fever[] ????Chills Cardiac:[] ????Chest pain[] ????Chest pressure[] ????Palpitations [] ????Shortness of breath when laying flat [] ????Shortness of breath at rest [x] ????Shortness of breath with exertion. Vascular: [] ????Pain in legs with walking[x] ????Pain in legsat rest[] ????Pain in legs when laying flat [] ????Claudication [] ????Pain in feet when walking [] ????Pain in feet at rest [] ????Pain in feet when laying flat [] ????History of DVT [] ????Phlebitis [x] ????Swelling in legs [] ????Varicose veins [x] ????Non-healing ulcers Pulmonary: [] ????Uses home oxygen [] ????Productive cough[] ????Hemoptysis [] ????Wheeze [x] ????COPD [] ????Asthma Neurologic: [] ????Dizziness [] ????Blackouts [] ????Seizures [] ????History of stroke [] ????History of TIA[] ????Aphasia [] ????Temporary blindness[] ????Dysphagia [] ????Weaknessor numbness in arms [] ????Weakness or numbnessin legs Musculoskeletal: [] ????Arthritis [] ????Joint swelling [x] ????Joint pain [] ????Low back pain Hematologic:[] ????Easy bruising[] ????Easy bleeding [] ????Hypercoagulable state [] ????Anemic [] ????Hepatitis Gastrointestinal:[] ????Blood in stool[] ????Vomiting blood[] ????Gastroesophageal reflux/heartburn[] ????Abdominal pain Genitourinary: [] ????Chronic kidney disease [] ????Difficulturination [] ????Frequenturination [] ????Burning with urination[] ????Hematuria Skin: [] ????Rashes [x] ????Ulcers  [x] ????Wounds Psychological: [] ????History of anxiety[] ????History of major depression.    Physical Examination  BP 134/87 (BP Location: Left Arm, Patient Position: Sitting, Cuff Size: Large)    Pulse (!) 102    Resp 16    Ht 5\' 9"  (1.753 m)    Wt (!) 332 lb (150.6 kg)    BMI 49.03 kg/m  Gen:  WD/WN, NAD.  Obese Head: [place]/AT, No temporalis wasting. Ear/Nose/Throat: Hearing grossly intact, nares w/o erythema or drainage Eyes: Conjunctiva clear. Sclera non-icteric Neck: Supple.  Trachea midline Pulmonary:  Good air movement, no use of accessory muscles.  Cardiac: RRR, no JVD Vascular:  Vessel Right Left  Radial Palpable Palpable                       Musculoskeletal: M/S 5/5 throughout.  No deformity or atrophy.  Only mild erythema of the left leg with marked stasis changes bilaterally.  1-2+ bilateral lower extremity edema. Neurologic: Sensation grossly intact in extremities.  Symmetrical.  Speech is fluent.  Psychiatric: Judgment intact, Mood & affect appropriate for pt's clinical situation. Dermatologic: No rashes or ulcers noted.  No cellulitis or open wounds.       Labs Recent Results (from the past 2160 hour(s))  Basic metabolic panel     Status: Abnormal   Collection Time: 05/19/18  4:13 PM  Result Value Ref Range   Sodium 139 135 - 145 mmol/L   Potassium 3.8 3.5 - 5.1 mmol/L   Chloride 102 98 - 111 mmol/L  CO2 24 22 - 32 mmol/L   Glucose, Bld 110 (H) 70 - 99 mg/dL   BUN 11 6 - 20 mg/dL   Creatinine, Ser 0.87 0.61 - 1.24 mg/dL   Calcium 8.8 (L) 8.9 - 10.3 mg/dL   GFR calc non Af Amer >60 >60 mL/min   GFR calc Af Amer >60 >60 mL/min   Anion gap 13 5 - 15    Comment: Performed at [facility], [address] [place], [place] [address]  CBC     Status: None   Collection Time: 05/19/18  4:13 PM  Result Value Ref Range   WBC 7.9 4.0 - 10.5 K/uL   RBC 4.88 4.22 - 5.81 MIL/uL   Hemoglobin 13.9 13.0 - 17.0 g/dL   HCT 42.6 39.0 - 52.0 %   MCV  87.3 80.0 - 100.0 fL   MCH 28.5 26.0 - 34.0 pg   MCHC 32.6 30.0 - 36.0 g/dL   RDW 13.6 11.5 - 15.5 %   Platelets 221 150 - 400 K/uL   nRBC 0.0 0.0 - 0.2 %    Comment: Performed at [facility], [address] [place], [place] [address]  Troponin I - ONCE - STAT     Status: Abnormal   Collection Time: 05/19/18  4:13 PM  Result Value Ref Range   Troponin I 0.04 (HH) <0.03 ng/mL    Comment: CRITICAL RESULT CALLED TO, READ BACK BY AND VERIFIED WITH RACHEL HAYDEN AT 1648 05/19/2018.  TFK Performed at [facility], [address] [place], [place] [address]   Troponin I - ONCE - STAT     Status: Abnormal   Collection Time: 05/19/18  8:14 PM  Result Value Ref Range   Troponin I 0.05 (HH) <0.03 ng/mL    Comment: CRITICAL VALUE NOTED. VALUE IS CONSISTENT WITH PREVIOUSLY REPORTED/CALLED VALUE.  TFK Performed at [facility], [address] [place], [place] [address]   Lactic acid, plasma     Status: Abnormal   Collection Time: 06/27/18 12:39 PM  Result Value Ref Range   Lactic Acid, Venous 2.7 (HH) 0.5 - 1.9 mmol/L    Comment: CRITICAL RESULT CALLED TO, READ BACK BY AND VERIFIED WITH DANIEL LEWIS/1334/06/27/2020/CHC Performed at [facility], [address] [place],  [address]   CBC WITH DIFFERENTIAL     Status: None   Collection Time: 06/27/18 12:39 PM  Result Value Ref Range   WBC 8.5 4.0 - 10.5 K/uL   RBC 5.52 4.22 - 5.81 MIL/uL   Hemoglobin 15.3 13.0 - 17.0 g/dL   HCT 47.7 39.0 - 52.0 %   MCV 86.4 80.0 - 100.0 fL   MCH 27.7 26.0 - 34.0 pg   MCHC 32.1 30.0 - 36.0 g/dL   RDW 14.1 11.5 - 15.5 %   Platelets 312 150 - 400 K/uL   nRBC 0.0 0.0 - 0.2 %   Neutrophils Relative % 74 %   Neutro Abs 6.3 1.7 - 7.7 K/uL   Lymphocytes Relative 14 %   Lymphs Abs 1.2 0.7 - 4.0 K/uL   Monocytes Relative 7 %   Monocytes Absolute 0.6 0.1 - 1.0 K/uL   Eosinophils Relative 3 %   Eosinophils Absolute 0.3 0.0 - 0.5 K/uL   Basophils Relative 1 %    Basophils Absolute 0.1 0.0 - 0.1 K/uL   Immature Granulocytes 1 %   Abs Immature Granulocytes 0.06 0.00 - 0.07 K/uL    Comment: Performed at [facility],  [place], [place] [address]  Blood Culture (routine x 2)     Status: None   Collection Time: 06/27/18 12:39 PM   Specimen: BLOOD  Result Value Ref Range   Specimen Description BLOOD RIGHT ANTECUBITAL    Special Requests      BOTTLES DRAWN AEROBIC AND ANAEROBIC Blood Culture adequate volume   Culture      NO GROWTH 5 DAYS Performed at [facility], [address] [place], [place] [address]    Report Status 07/02/2018 FINAL   Blood Culture (routine x 2)     Status: None   Collection Time: 06/27/18 12:39 PM   Specimen: BLOOD  Result Value Ref Range   Specimen Description BLOOD BLOOD LEFT FOREARM    Special Requests      BOTTLES DRAWN AEROBIC AND ANAEROBIC Blood Culture adequate volume   Culture      NO GROWTH 5 DAYS Performed at [facility], [address] [place], [place] [address]    Report Status 07/02/2018 FINAL   Basic metabolic panel     Status: None   Collection Time: 06/27/18 12:39 PM  Result Value Ref Range   Sodium 140 135 - 145 mmol/L   Potassium 4.4 3.5 - 5.1 mmol/L   Chloride 101 98 - 111 mmol/L   CO2 27 22 - 32 mmol/L   Glucose, Bld 94 70 - 99 mg/dL   BUN 10 6 - 20 mg/dL   Creatinine, Ser 0.99 0.61 - 1.24 mg/dL   Calcium 9.6 8.9 - 10.3 mg/dL   GFR calc non Af Amer >60 >60 mL/min   GFR calc Af Amer >60 >60 mL/min   Anion gap 12 5 - 15    Comment: Performed at [facility], [address] [place], [place] [address]  CK     Status: None   Collection Time: 06/27/18 12:39 PM  Result Value Ref Range   Total CK 239 49 - 397 U/L    Comment: Performed at [facility], [address] [place], [place] [address]  Hemoglobin A1c     Status: Abnormal   Collection Time: 06/27/18 12:39 PM  Result Value Ref Range   Hgb A1c MFr Bld 6.1 (H) 4.8 - 5.6 %     Comment: (NOTE) Pre diabetes:          5.7%-6.4% Diabetes:              >6.4% Glycemic control for   <7.0% adults with diabetes    Mean Plasma Glucose 128.37 mg/dL    Comment: Performed at [facility] [address], [place], [place] [address]  Novel Coronavirus,NAA,(SEND-OUT TO REF LAB - TAT 24-48 hrs); Hosp Order     Status: None   Collection Time: 06/27/18 12:40 PM   Specimen: Nasopharyngeal Swab; Respiratory  Result Value Ref Range   SARS-CoV-2, NAA NOT DETECTED NOT DETECTED    Comment: (NOTE) This test was developed and its performance characteristics determined by [employer]. This test has not been FDA cleared or approved. This test has been authorized by FDA under an Emergency Use Authorization (EUA). This test is only authorized for the duration of time the declaration that circumstances exist justifying the authorization of the emergency use of in vitro diagnostic tests for detection of SARS-CoV-2 virus and/or diagnosis of COVID-19 infection under section 564(b)(1) of the Act, 21 U.S.C. 696EXB-2(W)(4), unless the authorization is terminated or revoked sooner. When diagnostic testing is negative, the possibility  of a false negative result should be considered in the context of a patient's recent exposures and the presence of clinical signs and symptoms consistent with COVID-19. An individual without symptoms of COVID-19 and who is not shedding SARS-CoV-2 virus would expect to have a negative (not detected) result in this assay. Performed  At: [facility] [place], [place] [id number] [place] MD HB:[id number]    Coronavirus Source NASOPHARYNGEAL     Comment: Performed at [facility], [address] [place], [place] [address]  Glucose, capillary     Status: None   Collection Time: 06/27/18  4:42 PM  Result Value Ref Range   Glucose-Capillary 74 70 - 99 mg/dL  Glucose, capillary     Status: Abnormal    Collection Time: 06/27/18  9:22 PM  Result Value Ref Range   Glucose-Capillary 127 (H) 70 - 99 mg/dL   Comment 1 Notify RN   HIV Antibody (routine testing w rflx)     Status: None   Collection Time: 06/28/18  5:47 AM  Result Value Ref Range   HIV Screen 4th Generation wRfx Non Reactive Non Reactive    Comment: (NOTE) Performed At: [facility] [place], [place] [id number] [place] MD OE:[id number]   Basic metabolic panel     Status: Abnormal   Collection Time: 06/28/18  5:47 AM  Result Value Ref Range   Sodium 138 135 - 145 mmol/L   Potassium 4.1 3.5 - 5.1 mmol/L   Chloride 104 98 - 111 mmol/L   CO2 25 22 - 32 mmol/L   Glucose, Bld 115 (H) 70 - 99 mg/dL   BUN 11 6 - 20 mg/dL   Creatinine, Ser 0.91 0.61 - 1.24 mg/dL   Calcium 8.7 (L) 8.9 - 10.3 mg/dL   GFR calc non Af Amer >60 >60 mL/min   GFR calc Af Amer >60 >60 mL/min   Anion gap 9 5 - 15    Comment: Performed at [facility], [address] [place], [place] [address]  CBC     Status: None   Collection Time: 06/28/18  5:47 AM  Result Value Ref Range   WBC 9.0 4.0 - 10.5 K/uL   RBC 4.90 4.22 - 5.81 MIL/uL   Hemoglobin 13.6 13.0 - 17.0 g/dL   HCT 42.9 39.0 - 52.0 %   MCV 87.6 80.0 - 100.0 fL   MCH 27.8 26.0 - 34.0 pg   MCHC 31.7 30.0 - 36.0 g/dL   RDW 14.2 11.5 - 15.5 %   Platelets 273 150 - 400 K/uL   nRBC 0.0 0.0 - 0.2 %    Comment: Performed at [facility], [address] [place], [place] [address]  Glucose, capillary     Status: Abnormal   Collection Time: 06/28/18  7:55 AM  Result Value Ref Range   Glucose-Capillary 112 (H) 70 - 99 mg/dL  Glucose, capillary     Status: Abnormal   Collection Time: 06/28/18 11:42 AM  Result Value Ref Range   Glucose-Capillary 146 (H) 70 - 99 mg/dL  Glucose, capillary     Status: None   Collection Time: 06/28/18  4:57 PM  Result Value Ref Range   Glucose-Capillary 95 70 - 99 mg/dL  Glucose, capillary     Status: Abnormal    Collection Time: 06/28/18  9:44 PM  Result Value Ref Range   Glucose-Capillary 105 (H) 70 - 99 mg/dL  CBC     Status: Abnormal  Collection Time: 06/29/18  3:17 AM  Result Value Ref Range   WBC 8.2 4.0 - 10.5 K/uL   RBC 4.66 4.22 - 5.81 MIL/uL   Hemoglobin 12.9 (L) 13.0 - 17.0 g/dL   HCT 40.6 39.0 - 52.0 %   MCV 87.1 80.0 - 100.0 fL   MCH 27.7 26.0 - 34.0 pg   MCHC 31.8 30.0 - 36.0 g/dL   RDW 14.2 11.5 - 15.5 %   Platelets 247 150 - 400 K/uL   nRBC 0.0 0.0 - 0.2 %    Comment: Performed at [facility], [address] [place], [place] [address]  Basic metabolic panel     Status: Abnormal   Collection Time: 06/29/18  3:17 AM  Result Value Ref Range   Sodium 139 135 - 145 mmol/L   Potassium 3.7 3.5 - 5.1 mmol/L   Chloride 103 98 - 111 mmol/L   CO2 23 22 - 32 mmol/L   Glucose, Bld 100 (H) 70 - 99 mg/dL   BUN 13 6 - 20 mg/dL   Creatinine, Ser 1.19 0.61 - 1.24 mg/dL   Calcium 8.6 (L) 8.9 - 10.3 mg/dL   GFR calc non Af Amer >60 >60 mL/min   GFR calc Af Amer >60 >60 mL/min   Anion gap 13 5 - 15    Comment: Performed at [facility], [address] [place], [place] [address]  Magnesium     Status: None   Collection Time: 06/29/18  3:17 AM  Result Value Ref Range   Magnesium 2.0 1.7 - 2.4 mg/dL    Comment: Performed at [facility], [address] [place], [place] [address]  Glucose, capillary     Status: None   Collection Time: 06/29/18  7:46 AM  Result Value Ref Range   Glucose-Capillary 92 70 - 99 mg/dL   Comment 1 Notify RN   Glucose, capillary     Status: Abnormal   Collection Time: 06/29/18 11:44 AM  Result Value Ref Range   Glucose-Capillary 109 (H) 70 - 99 mg/dL   Comment 1 Notify RN   Glucose, capillary     Status: Abnormal   Collection Time: 06/29/18  4:59 PM  Result Value Ref Range   Glucose-Capillary 120 (H) 70 - 99 mg/dL   Comment 1 Notify RN   Glucose, capillary     Status: Abnormal   Collection Time: 06/29/18  9:47 PM  Result  Value Ref Range   Glucose-Capillary 117 (H) 70 - 99 mg/dL   Comment 1 Notify RN   Basic metabolic panel     Status: Abnormal   Collection Time: 06/30/18  3:09 AM  Result Value Ref Range   Sodium 142 135 - 145 mmol/L   Potassium 3.7 3.5 - 5.1 mmol/L   Chloride 103 98 - 111 mmol/L   CO2 27 22 - 32 mmol/L   Glucose, Bld 99 70 - 99 mg/dL   BUN 14 6 - 20 mg/dL   Creatinine, Ser 1.27 (H) 0.61 - 1.24 mg/dL   Calcium 8.8 (L) 8.9 - 10.3 mg/dL   GFR calc non Af Amer >60 >60 mL/min   GFR calc Af Amer >60 >60 mL/min   Anion gap 12 5 - 15    Comment: Performed at [facility], [address] [place], [place] [address]  Glucose, capillary     Status: Abnormal   Collection Time: 06/30/18  7:34 AM  Result Value Ref Range   Glucose-Capillary 112 (H) 70 -  99 mg/dL  Glucose, capillary     Status: None   Collection Time: 06/30/18 12:13 PM  Result Value Ref Range   Glucose-Capillary 87 70 - 99 mg/dL    Radiology Vas [place] Abi With/wo Tbi  Result Date: 07/12/2018 LOWER EXTREMITY DOPPLER STUDY Indications: Cellulitis.  Comparison Study: 02/11/2016 Performing Technologist: Almira Coaster RVS  Examination Guidelines: A complete evaluation includes at minimum, Doppler waveform signals and systolic blood pressure reading at the level of bilateral brachial, anterior tibial, and posterior tibial arteries, when vessel segments are accessible. Bilateral testing is considered an integral part of a complete examination. Photoelectric Plethysmograph (PPG) waveforms and toe systolic pressure readings are included as required and additional duplex testing as needed. Limited examinations for reoccurring indications may be performed as noted.  ABI Findings: +---------+------------------+-----+---------+--------+  Right     Rt Pressure (mmHg) Index Waveform  Comment   +---------+------------------+-----+---------+--------+  Brachial  134                                           +---------+------------------+-----+---------+--------+  ATA       135                0.99  triphasic           +---------+------------------+-----+---------+--------+  PTA       138                1.01  triphasic           +---------+------------------+-----+---------+--------+  Great Toe 196                1.44  Normal              +---------+------------------+-----+---------+--------+ +---------+------------------+-----+---------+-------+  Left      Lt Pressure (mmHg) Index Waveform  Comment  +---------+------------------+-----+---------+-------+  Brachial  136                                         +---------+------------------+-----+---------+-------+  ATA       137                1.01  triphasic          +---------+------------------+-----+---------+-------+  PTA       140                1.03  triphasic          +---------+------------------+-----+---------+-------+  Great Toe 165                1.21  Normal             +---------+------------------+-----+---------+-------+ +-------+-----------+-----------+------------+------------+  ABI/TBI Today's ABI Today's TBI Previous ABI Previous TBI  +-------+-----------+-----------+------------+------------+  Right   1.01        1.44        1.06         .68           +-------+-----------+-----------+------------+------------+  Left    1.03        1.21        1.13         .63           +-------+-----------+-----------+------------+------------+ Bilateral ABIs appear essentially unchanged compared to prior study on 02/11/2016. Bilateral TBIs appear increased compared to prior study on 02/11/2016.  Summary: Right: Resting right ankle-brachial index is within normal range. No evidence of significant right lower extremity arterial disease. The right toe-brachial index is normal. Left: Resting left ankle-brachial index is within normal range. No evidence of significant left lower extremity arterial disease. The left toe-brachial index is normal.  *See table(s) above for  measurements and observations.  Electronically signed by Leotis Pain MD on 07/12/2018 at 12:25:59 PM.    Final     Assessment/Plan Obesity Discussed that weight loss would be very beneficial for wound healing and leg swelling.  Essential hypertension, benign blood pressure control important in reducing the progression of atherosclerotic disease. On appropriate oral medications.   Hyperlipidemia lipid control important in reducing the progression of atherosclerotic disease. Continue statin therapy  DM2 (diabetes mellitus, type 2) (HCC) blood glucose control important in reducing the progression of atherosclerotic disease. Also, involved in wound healing. On appropriate medications.  Lower limb ulcer, calf, left, limited to breakdown of skin (HCC) Small ulcerations present bilaterally which are significantly improved.  Continue 3 layer Unna boot and a new 3 layer Unna boot was placed today.  Changed weekly.  Reassess in 4 weeks  Lymphedema Symptoms have improved with Unna boots and treatment of cellulitis.  Continue Unna boots for now.    Leotis Pain, MD  08/05/2018 11:54 AM    This note was created with Dragon medical transcription system.  Any errors from dictation are purely unintentional

## 2018-08-12 ENCOUNTER — Other Ambulatory Visit: Payer: Self-pay

## 2018-08-12 ENCOUNTER — Ambulatory Visit (INDEPENDENT_AMBULATORY_CARE_PROVIDER_SITE_OTHER): Payer: Medicare Other | Admitting: Nurse Practitioner

## 2018-08-12 ENCOUNTER — Other Ambulatory Visit (INDEPENDENT_AMBULATORY_CARE_PROVIDER_SITE_OTHER): Payer: Self-pay | Admitting: Nurse Practitioner

## 2018-08-12 ENCOUNTER — Encounter (INDEPENDENT_AMBULATORY_CARE_PROVIDER_SITE_OTHER): Payer: Self-pay | Admitting: Nurse Practitioner

## 2018-08-12 VITALS — BP 182/89 | HR 106 | Resp 12 | Ht 69.0 in | Wt 332.0 lb

## 2018-08-12 DIAGNOSIS — L97221 Non-pressure chronic ulcer of left calf limited to breakdown of skin: Secondary | ICD-10-CM

## 2018-08-12 MED ORDER — CEPHALEXIN 500 MG PO CAPS: 500.0000 mg | ORAL_CAPSULE | Freq: Three times a day (TID) | ORAL | 0 refills | Status: DC

## 2018-08-12 NOTE — Progress Notes (Signed)
History of Present Illness  There is no documented history at this time  Assessments & Plan   There are no diagnoses linked to this encounter.    Additional instructions  Subjective:  Patient presents with venous ulcer of the Bilateral lower extremity.    Procedure:  3 layer unna wrap was placed Bilateral lower extremity.   Plan:   Follow up in one week.  

## 2018-08-17 ENCOUNTER — Telehealth: Payer: Self-pay | Admitting: *Deleted

## 2018-08-17 ENCOUNTER — Telehealth: Payer: Self-pay | Admitting: Pain Medicine

## 2018-08-17 NOTE — Telephone Encounter (Signed)
Called to let patient know that Magnesium is OTC and he can get it that way if he would like.  Otherwise I can ask Dr Dossie Arbour to refill that for him if his insurance will cover.

## 2018-08-17 NOTE — Telephone Encounter (Signed)
Patient needs refill on magnesium oxide. He has enough meds to last on everything else but this one was no refill. Please find out if Dr. Dossie Arbour wants him to continue and if so he will need script called in

## 2018-08-19 ENCOUNTER — Other Ambulatory Visit: Payer: Self-pay

## 2018-08-19 ENCOUNTER — Ambulatory Visit (INDEPENDENT_AMBULATORY_CARE_PROVIDER_SITE_OTHER): Payer: Medicare Other | Admitting: Vascular Surgery

## 2018-08-19 VITALS — BP 163/103 | HR 109 | Resp 16 | Ht 69.0 in | Wt 334.0 lb

## 2018-08-19 DIAGNOSIS — L97221 Non-pressure chronic ulcer of left calf limited to breakdown of skin: Secondary | ICD-10-CM

## 2018-08-19 DIAGNOSIS — M7989 Other specified soft tissue disorders: Secondary | ICD-10-CM

## 2018-08-19 NOTE — Progress Notes (Signed)
History of Present Illness  There is no documented history at this time  Assessments & Plan   There are no diagnoses linked to this encounter.    Additional instructions  Subjective:  Patient presents with venous ulcer of the Bilateral lower extremity.    Procedure:  3 layer unna wrap was placed Bilateral lower extremity.   Plan:   Follow up in one week.  

## 2018-08-26 ENCOUNTER — Ambulatory Visit (INDEPENDENT_AMBULATORY_CARE_PROVIDER_SITE_OTHER): Payer: Medicare Other | Admitting: Nurse Practitioner

## 2018-08-26 ENCOUNTER — Other Ambulatory Visit: Payer: Self-pay

## 2018-08-26 ENCOUNTER — Encounter (INDEPENDENT_AMBULATORY_CARE_PROVIDER_SITE_OTHER): Payer: Self-pay | Admitting: Nurse Practitioner

## 2018-08-26 VITALS — BP 169/98 | HR 101 | Resp 16 | Wt 332.0 lb

## 2018-08-26 DIAGNOSIS — M150 Primary generalized (osteo)arthritis: Secondary | ICD-10-CM

## 2018-08-26 DIAGNOSIS — L03116 Cellulitis of left lower limb: Secondary | ICD-10-CM | POA: Diagnosis not present

## 2018-08-26 DIAGNOSIS — E119 Type 2 diabetes mellitus without complications: Secondary | ICD-10-CM

## 2018-08-26 DIAGNOSIS — L03115 Cellulitis of right lower limb: Secondary | ICD-10-CM

## 2018-08-26 DIAGNOSIS — M159 Polyosteoarthritis, unspecified: Secondary | ICD-10-CM

## 2018-08-26 MED ORDER — FUROSEMIDE 20 MG PO TABS: 20.0000 mg | ORAL_TABLET | Freq: Every day | ORAL | 0 refills | Status: DC

## 2018-08-29 ENCOUNTER — Telehealth (INDEPENDENT_AMBULATORY_CARE_PROVIDER_SITE_OTHER): Payer: Self-pay | Admitting: Vascular Surgery

## 2018-08-29 ENCOUNTER — Encounter (INDEPENDENT_AMBULATORY_CARE_PROVIDER_SITE_OTHER): Payer: Medicare Other

## 2018-08-29 NOTE — Telephone Encounter (Signed)
Please advise on below. AS, CMA

## 2018-08-29 NOTE — Telephone Encounter (Signed)
Patient called in regarding infection in right leg since removing boots on Friday and left leg is very tender. Patient wanting to schedule another appt to get boots reapplied. Please advise

## 2018-08-29 NOTE — Telephone Encounter (Signed)
Disregard message below. Added to the wrong call. AS, CMA

## 2018-08-29 NOTE — Telephone Encounter (Signed)
Can you please call this patient and schedule him for nurse visit unna wraps per Dew. Thank you

## 2018-08-30 ENCOUNTER — Encounter: Payer: Self-pay | Admitting: Pain Medicine

## 2018-08-30 ENCOUNTER — Encounter (INDEPENDENT_AMBULATORY_CARE_PROVIDER_SITE_OTHER): Payer: Self-pay | Admitting: Nurse Practitioner

## 2018-08-30 ENCOUNTER — Encounter (INDEPENDENT_AMBULATORY_CARE_PROVIDER_SITE_OTHER): Payer: Medicare Other

## 2018-08-30 ENCOUNTER — Ambulatory Visit (INDEPENDENT_AMBULATORY_CARE_PROVIDER_SITE_OTHER): Payer: Medicare Other | Admitting: Nurse Practitioner

## 2018-08-30 ENCOUNTER — Other Ambulatory Visit: Payer: Self-pay

## 2018-08-30 VITALS — BP 131/85 | HR 70 | Resp 16 | Ht 71.0 in | Wt 330.0 lb

## 2018-08-30 DIAGNOSIS — L97221 Non-pressure chronic ulcer of left calf limited to breakdown of skin: Secondary | ICD-10-CM

## 2018-08-30 NOTE — Progress Notes (Signed)
History of Present Illness  There is no documented history at this time  Assessments & Plan   There are no diagnoses linked to this encounter.    Additional instructions  Subjective:  Patient presents with venous ulcer of the Bilateral lower extremity.    Procedure:  3 layer unna wrap was placed Bilateral lower extremity.   Plan:   Follow up in one week.  

## 2018-08-30 NOTE — Progress Notes (Signed)
Pain Management Virtual Encounter Note - Virtual Visit via Telephone Telehealth (real-time audio visits between healthcare provider and patient).   Patient's Phone No. & Preferred Pharmacy:  [phone] (home); [phone] (mobile); (Preferred) [phone] [email or web address]  [facility], [place] [address] [place] [address] Phone: [phone] Fax: [phone]    Pre-screening note:  Our staff contacted [name] and offered him an "in person", "face-to-face" appointment versus a telephone encounter. He indicated preferring the telephone encounter, at this time.   Reason for Virtual Visit: COVID-19*  Social distancing based on CDC and AMA recommendations.   I contacted [name] on 08/31/2018 via telephone.      I clearly identified myself as Jonathan Cola, MD. I verified that I was speaking with the correct person using two identifiers (Name: [name], and date of birth: Dec 26, 1965).  Advanced Informed Consent I sought verbal advanced consent from [name] for virtual visit interactions. I informed [name] of possible security and privacy concerns, risks, and limitations associated with providing "not-in-person" medical evaluation and management services. I also informed [name] of the availability of "in-person" appointments. Finally, I informed him that there would be a charge for the virtual visit and that he could be  personally, fully or partially, financially responsible for it. [name] expressed understanding and agreed to proceed.   Historic Elements   [name] is a 53 y.o. year old, male patient evaluated today after his last encounter by our practice on 08/17/2018. [name]  has a past medical history of COPD (chronic obstructive pulmonary disease) ([place]), Coronary artery disease, Diabetes mellitus without complication ([place]),  Hypertension, Peptic ulcer, and Sleep apnea. He also  has a past surgical history that includes Esophagogastroduodenoscopy (egd) with propofol (N/A, 11/09/2016); Colonoscopy with propofol (N/A, 11/09/2016); Colonoscopy with propofol (N/A, 07/06/2017); and Esophagogastroduodenoscopy (egd) with propofol (N/A, 07/06/2017). [name] has a current medication list which includes the following prescription(s): albuterol, aspirin-acetaminophen-caffeine, atorvastatin, bupropion, cholecalciferol, fluticasone-salmeterol, furosemide, hydrochlorothiazide, loratadine, losartan, magnesium, meloxicam, metformin, oxybutynin, pantoprazole, tizanidine, tramadol, and calcium carbonate. He  reports that he has been smoking cigarettes. He has a 30.00 pack-year smoking history. He has never used smokeless tobacco. He reports that he does not drink alcohol or use drugs. [name] is allergic to shellfish allergy; codeine; and lisinopril.   HPI  Today, he is being contacted for medication management.  The patient indicates doing well with the current medication regimen. No adverse reactions or side effects reported to the medications.   Pharmacotherapy Assessment  Analgesic: Tramadol 50 mg, 1 tabPO q 6 hrs (200 mg/day of tramadol) MME/day: 20 mg/day.   Monitoring: Pharmacotherapy: No side-effects or adverse reactions reported. [place] PMP: PDMP reviewed during this encounter.       Compliance: No problems identified. Effectiveness: Clinically acceptable. Plan: Refer to "POC".  UDS:  Summary  Date Value Ref Range Status  10/07/2017 FINAL  Final    Comment:    ==================================================================== TOXASSURE COMP DRUG ANALYSIS,UR ==================================================================== Test                             Result       Flag       Units Drug Present and Declared for Prescription Verification   Bupropion  PRESENT      EXPECTED   Hydroxybupropion                PRESENT      EXPECTED    Hydroxybupropion is an expected metabolite of bupropion. Drug Present not Declared for Prescription Verification   Acetaminophen                  PRESENT      UNEXPECTED ==================================================================== Test                      Result    Flag   Units      Ref Range   Creatinine              157              mg/dL      >=20 ==================================================================== Declared Medications:  The flagging and interpretation on this report are based on the  following declared medications.  Unexpected results may arise from  inaccuracies in the declared medications.  **Note: The testing scope of this panel includes these medications:  Bupropion  **Note: The testing scope of this panel does not include following  reported medications:  Albuterol  Atorvastatin  Fluticasone (Advair)  Furosemide (Lasix)  Hydrochlorothiazide (Microzide)  Loratadine (Claritin)  Losartan (Cozaar)  Metformin  Oxybutynin (Ditropan)  Pantoprazole (Protonix)  Salmeterol (Advair)  Tiotropium (Spiriva) ==================================================================== For clinical consultation, please call [phone]. ====================================================================    Laboratory Chemistry Profile (12 mo)  Renal: 10/07/2017: BUN/Creatinine Ratio 9 06/30/2018: BUN 14; Creatinine, Ser 1.27  Lab Results  Component Value Date   GFRAA >60 06/30/2018   GFRNONAA >60 06/30/2018   Hepatic: 10/15/2017: Albumin 4.0 Lab Results  Component Value Date   AST 21 10/15/2017   ALT 23 10/15/2017   Other: 10/07/2017: 25-Hydroxy, Vitamin D 29; 25-Hydroxy, Vitamin D-2 <1.0; 25-Hydroxy, Vitamin D-3 29; CRP 5; Sed Rate 23; Vitamin B-12 466 Note: Above Lab results reviewed.  Imaging  Last 90 days:  Dg Tibia/fibula Left  Result Date: 06/27/2018 CLINICAL DATA:  History of cellulitis. EXAM: LEFT TIBIA AND  FIBULA - 2 VIEW COMPARISON:  No recent. FINDINGS: Diffuse soft tissue swelling. No acute bony or joint abnormality identified. No evidence of fracture or dislocation. IMPRESSION: Diffuse soft tissue swelling.  No acute bony abnormality. Electronically Signed   By: Marcello Moores  Register   On: 06/27/2018 12:53   US Venous Img Lower Unilateral Left  Result Date: 06/27/2018 CLINICAL DATA:  Pain, redness, swelling. EXAM: LEFT LOWER EXTREMITY VENOUS DOPPLER ULTRASOUND TECHNIQUE: Gray-scale sonography with graded compression, as well as color Doppler and duplex ultrasound were performed to evaluate the lower extremity deep venous systems from the level of the common femoral vein and including the common femoral, femoral, profunda femoral, popliteal and calf veins including the posterior tibial, peroneal and gastrocnemius veins when visible. The superficial great saphenous vein was also interrogated. Spectral Doppler was utilized to evaluate flow at rest and with distal augmentation maneuvers in the common femoral, femoral and popliteal veins. COMPARISON:  06/27/2018. FINDINGS: Contralateral Common Femoral Vein: Respiratory phasicity is normal and symmetric with the symptomatic side. No evidence of thrombus. Normal compressibility. Common Femoral Vein: No evidence of thrombus. Normal compressibility, respiratory phasicity and response to augmentation. Saphenofemoral Junction: No evidence of thrombus. Normal compressibility and flow on color Doppler imaging. Profunda Femoral Vein: No evidence of thrombus. Normal compressibility and flow on color Doppler imaging. Femoral Vein: No evidence of thrombus. Normal compressibility, respiratory  phasicity and response to augmentation. Popliteal Vein: No evidence of thrombus. Normal compressibility, respiratory phasicity and response to augmentation. Calf Veins: No evidence of thrombus. Normal compressibility and flow on color Doppler imaging. Limited visibility due to patient's body  habitus. Superficial Great Saphenous Vein: No evidence of thrombus. Normal compressibility. Other Findings:  None. IMPRESSION: No evidence of deep venous thrombosis. Electronically Signed   By: [place]   On: 06/27/2018 14:15   Vas [place] Abi With/wo Tbi  Result Date: 07/12/2018 LOWER EXTREMITY DOPPLER STUDY Indications: Cellulitis.  Comparison Study: 02/11/2016 Performing Technologist: Almira Coaster RVS  Examination Guidelines: A complete evaluation includes at minimum, Doppler waveform signals and systolic blood pressure reading at the level of bilateral brachial, anterior tibial, and posterior tibial arteries, when vessel segments are accessible. Bilateral testing is considered an integral part of a complete examination. Photoelectric Plethysmograph (PPG) waveforms and toe systolic pressure readings are included as required and additional duplex testing as needed. Limited examinations for reoccurring indications may be performed as noted.  ABI Findings: +---------+------------------+-----+---------+--------+ Right    Rt Pressure (mmHg)IndexWaveform Comment  +---------+------------------+-----+---------+--------+ Brachial 134                                      +---------+------------------+-----+---------+--------+ ATA      135               0.99 triphasic         +---------+------------------+-----+---------+--------+ PTA      138               1.01 triphasic         +---------+------------------+-----+---------+--------+ Great Toe196               1.44 Normal            +---------+------------------+-----+---------+--------+ +---------+------------------+-----+---------+-------+ Left     Lt Pressure (mmHg)IndexWaveform Comment +---------+------------------+-----+---------+-------+ Brachial 136                                     +---------+------------------+-----+---------+-------+ ATA      137               1.01 triphasic         +---------+------------------+-----+---------+-------+ PTA      140               1.03 triphasic        +---------+------------------+-----+---------+-------+ Great Toe165               1.21 Normal           +---------+------------------+-----+---------+-------+ +-------+-----------+-----------+------------+------------+ ABI/TBIToday's ABIToday's TBIPrevious ABIPrevious TBI +-------+-----------+-----------+------------+------------+ Right  1.01       1.44       1.06        .68          +-------+-----------+-----------+------------+------------+ Left   1.03       1.21       1.13        .63          +-------+-----------+-----------+------------+------------+ Bilateral ABIs appear essentially unchanged compared to prior study on 02/11/2016. Bilateral TBIs appear increased compared to prior study on 02/11/2016.  Summary: Right: Resting right ankle-brachial index is within normal range. No evidence of significant right lower extremity arterial disease. The right toe-brachial index is normal. Left: Resting left ankle-brachial index is within normal range. No  evidence of significant left lower extremity arterial disease. The left toe-brachial index is normal.  *See table(s) above for measurements and observations.  Electronically signed by Leotis Pain MD on 07/12/2018 at 12:25:59 PM.    Final     Assessment  The primary encounter diagnosis was Chronic pain syndrome. Diagnoses of Chronic low back pain (Primary Area of Pain) (Bilateral) (R>L) w/ sciatica (Bilateral), Chronic lower extremity pain (Secondary Area of Pain) (Bilateral) (R>L), Chronic musculoskeletal pain, Osteoarthritis of facet joint of lumbar spine, and Osteoarthritis involving multiple joints were also pertinent to this visit.  Plan of Care  I have discontinued [name]'s meloxicam, cephALEXin, meloxicam, meloxicam, and meloxicam. I am also having him start on meloxicam. Additionally, I am having him maintain his  loratadine, atorvastatin, Fluticasone-Salmeterol, losartan, hydrochlorothiazide, metFORMIN, pantoprazole, albuterol, oxybutynin, aspirin-acetaminophen-caffeine, calcium carbonate, cholecalciferol, buPROPion, Magnesium, furosemide, tiZANidine, and traMADol.  Pharmacotherapy (Medications Ordered): Meds ordered this encounter  Medications  . meloxicam (MOBIC) 15 MG tablet    Sig: Take 1 tablet (15 mg total) by mouth daily.    Dispense:  30 tablet    Refill:  5    Fill one day early if pharmacy is closed on scheduled refill date. May substitute for generic if available.  Marland Kitchen tiZANidine (ZANAFLEX) 4 MG tablet    Sig: Take 1 tablet (4 mg total) by mouth every 8 (eight) hours as needed for muscle spasms.    Dispense:  90 tablet    Refill:  5    Fill one day early if pharmacy is closed on scheduled refill date. May substitute for generic if available.  . traMADol (ULTRAM) 50 MG tablet    Sig: Take 1 tablet (50 mg total) by mouth every 6 (six) hours as needed for severe pain. Must last 30 days Max: 4/day    Dispense:  120 tablet    Refill:  5    Chronic Pain: STOP Act (Not applicable) Fill 1 day early if closed on refill date. Do not fill until: 08/31/2018. To last until: 03/29/2019. Avoid benzodiazepines within 8 hours of opioids   Orders:  No orders of the defined types were placed in this encounter.  Follow-up plan:   Return in about 6 months (around 02/27/2019) for (VV), E/M (MM).      Interventional management options:  Considerations:   NOTE:NO RFAuntil BMI is <35 Diagnostic bilateral L4 transforaminal LESI#1 Diagnostic (Midline) L3-4 LESI #1 Possible bilateral lumbar facetRFA   Palliative PRN treatment(s):   Diagnostic bilateral L4 transforaminal LESI #2 + Midline L3-4 interlaminar LESI #2  Diagnostic bilateral lumbar facet block#3under fluoroscopic guidance and IV sedation     Recent Visits Date Type Provider Dept  07/11/18 Office Visit Milinda Pointer, MD [facility]  Showing recent visits within past 90 days and meeting all other requirements   Today's Visits Date Type Provider Dept  08/31/18 Office Visit Milinda Pointer, MD [facility]  Showing today's visits and meeting all other requirements   Future Appointments No visits were found meeting these conditions.  Showing future appointments within next 90 days and meeting all other requirements   I discussed the assessment and treatment plan with the patient. The patient was provided an opportunity to ask questions and all were answered. The patient agreed with the plan and demonstrated an understanding of the instructions.  Patient advised to call back or seek an in-person evaluation if the symptoms or condition worsens.  Total duration of non-face-to-face encounter: 15 minutes.  Note by:  Jonathan Cola, MD Date: 08/31/2018; Time: 11:18 AM  Note: This dictation was prepared with Dragon dictation. Any transcriptional errors that may result from this process are unintentional.  Disclaimer:  * Given the special circumstances of the COVID-19 pandemic, the federal government has announced that the Office for Civil Rights (OCR) will exercise its enforcement discretion and will not impose penalties on physicians using telehealth in the event of noncompliance with regulatory requirements under the [place] (HIPAA) in connection with the good faith provision of telehealth during the YQIHK-74 national public health emergency. ([place])

## 2018-08-31 ENCOUNTER — Ambulatory Visit: Payer: Medicare Other | Attending: Pain Medicine | Admitting: Pain Medicine

## 2018-08-31 DIAGNOSIS — M5442 Lumbago with sciatica, left side: Secondary | ICD-10-CM

## 2018-08-31 DIAGNOSIS — M47816 Spondylosis without myelopathy or radiculopathy, lumbar region: Secondary | ICD-10-CM

## 2018-08-31 DIAGNOSIS — G8929 Other chronic pain: Secondary | ICD-10-CM

## 2018-08-31 DIAGNOSIS — M7918 Myalgia, other site: Secondary | ICD-10-CM

## 2018-08-31 DIAGNOSIS — M5441 Lumbago with sciatica, right side: Secondary | ICD-10-CM

## 2018-08-31 DIAGNOSIS — M159 Polyosteoarthritis, unspecified: Secondary | ICD-10-CM

## 2018-08-31 DIAGNOSIS — G894 Chronic pain syndrome: Secondary | ICD-10-CM | POA: Diagnosis not present

## 2018-08-31 DIAGNOSIS — M79604 Pain in right leg: Secondary | ICD-10-CM | POA: Diagnosis not present

## 2018-08-31 DIAGNOSIS — M150 Primary generalized (osteo)arthritis: Secondary | ICD-10-CM

## 2018-08-31 DIAGNOSIS — M79605 Pain in left leg: Secondary | ICD-10-CM

## 2018-08-31 MED ORDER — MELOXICAM 15 MG PO TABS: 15.0000 mg | ORAL_TABLET | Freq: Every day | ORAL | 0 refills | Status: DC

## 2018-08-31 MED ORDER — TIZANIDINE HCL 4 MG PO TABS: 4.0000 mg | ORAL_TABLET | Freq: Three times a day (TID) | ORAL | 0 refills | Status: DC | PRN

## 2018-08-31 MED ORDER — TRAMADOL HCL 50 MG PO TABS: 50.0000 mg | ORAL_TABLET | Freq: Four times a day (QID) | ORAL | 5 refills | Status: DC | PRN

## 2018-08-31 MED ORDER — TRAMADOL HCL 50 MG PO TABS: 50.0000 mg | ORAL_TABLET | Freq: Four times a day (QID) | ORAL | 0 refills | Status: DC | PRN

## 2018-08-31 MED ORDER — TIZANIDINE HCL 4 MG PO TABS: 4.0000 mg | ORAL_TABLET | Freq: Three times a day (TID) | ORAL | 5 refills | Status: DC | PRN

## 2018-08-31 MED ORDER — MELOXICAM 15 MG PO TABS: 15.0000 mg | ORAL_TABLET | Freq: Every day | ORAL | 5 refills | Status: DC

## 2018-09-04 ENCOUNTER — Encounter (INDEPENDENT_AMBULATORY_CARE_PROVIDER_SITE_OTHER): Payer: Self-pay | Admitting: Nurse Practitioner

## 2018-09-04 NOTE — Progress Notes (Signed)
SUBJECTIVE:  Patient ID: [name], male    DOB: [birth year], 53 y.o.   MRN: [id number] Chief Complaint  Patient presents with  . Follow-up    unna check    HPI  [name] is a 53 y.o. male today patient presents for Unna wrap evaluation.  Patient has been in a wraps for 4 weeks and at this time he has no evidence of ulceration bilaterally.  No evidence of cellulitis.  His legs look better than they have previously.  Patient denies any fever, chills, nausea, vomiting or diarrhea.  Patient endorses using his lymph pump on a daily basis.  Patient also complains of swelling within his bilateral thighs  Past Medical History:  Diagnosis Date  . COPD (chronic obstructive pulmonary disease) ([place])   . Coronary artery disease   . Diabetes mellitus without complication ([place])   . Hypertension   . Peptic ulcer   . Sleep apnea     Past Surgical History:  Procedure Laterality Date  . COLONOSCOPY WITH PROPOFOL N/A 11/09/2016   Procedure: COLONOSCOPY WITH PROPOFOL;  Surgeon: Lollie Sails, MD;  Location: [facility] ENDOSCOPY;  Service: Endoscopy;  Laterality: N/A;  . COLONOSCOPY WITH PROPOFOL N/A 07/06/2017   Procedure: COLONOSCOPY WITH PROPOFOL;  Surgeon: Lollie Sails, MD;  Location: [facility] ENDOSCOPY;  Service: Endoscopy;  Laterality: N/A;  . ESOPHAGOGASTRODUODENOSCOPY (EGD) WITH PROPOFOL N/A 11/09/2016   Procedure: ESOPHAGOGASTRODUODENOSCOPY (EGD) WITH PROPOFOL;  Surgeon: Lollie Sails, MD;  Location: [facility] ENDOSCOPY;  Service: Endoscopy;  Laterality: N/A;  . ESOPHAGOGASTRODUODENOSCOPY (EGD) WITH PROPOFOL N/A 07/06/2017   Procedure: ESOPHAGOGASTRODUODENOSCOPY (EGD) WITH PROPOFOL;  Surgeon: Lollie Sails, MD;  Location: [facility] ENDOSCOPY;  Service: Endoscopy;  Laterality: N/A;    Social History   Socioeconomic History  . Marital status: Single    Spouse name: Not on file  . Number of children: Not on file  . Years of education: Not on file  . Highest education level:  Not on file  Occupational History  . Not on file  Social Needs  . Financial resource strain: Not on file  . Food insecurity    Worry: Not on file    Inability: Not on file  . Transportation needs    Medical: Not on file    Non-medical: Not on file  Tobacco Use  . Smoking status: Current Every Day Smoker    Packs/day: 1.00    Years: 30.00    Pack years: 30.00    Types: Cigarettes  . Smokeless tobacco: Never Used  . Tobacco comment: trying decrease  Substance and Sexual Activity  . Alcohol use: No  . Drug use: No  . Sexual activity: Never  Lifestyle  . Physical activity    Days per week: Not on file    Minutes per session: Not on file  . Stress: Not on file  Relationships  . Social Herbalist on phone: Not on file    Gets together: Not on file    Attends religious service: Not on file    Active member of club or organization: Not on file    Attends meetings of clubs or organizations: Not on file    Relationship status: Not on file  . Intimate partner violence    Fear of current or ex partner: Not on file    Emotionally abused: Not on file    Physically abused: Not on file    Forced sexual activity: Not on file  Other Topics Concern  .  Not on file  Social History Narrative  . Not on file    Family History  Problem Relation Age of Onset  . Diabetes Mother   . Diabetes Father   . Stomach cancer Father     Allergies  Allergen Reactions  . Shellfish Allergy Swelling  . Codeine Rash  . Lisinopril Cough     Review of Systems   Review of Systems: Negative Unless Checked Constitutional: [] Weight loss  [] Fever  [] Chills Cardiac: [] Chest pain   []  Atrial Fibrillation  [] Palpitations   [] Shortness of breath when laying flat   [] Shortness of breath with exertion. [] Shortness of breath at rest Vascular:  [] Pain in legs with walking   [] Pain in legs with standing [] Pain in legs when laying flat   [] Claudication    [] Pain in feet when laying flat     [] History of DVT   [] Phlebitis   [x] Swelling in legs   [] Varicose veins   [] Non-healing ulcers Pulmonary:   [] Uses home oxygen   [] Productive cough   [] Hemoptysis   [] Wheeze  [x] COPD   [] Asthma Neurologic:  [] Dizziness   [] Seizures  [] Blackouts [] History of stroke   [] History of TIA  [] Aphasia   [] Temporary Blindness   [] Weakness or numbness in arm   [] Weakness or numbness in leg Musculoskeletal:   [] Joint swelling   [] Joint pain   [] Low back pain  []  History of Knee Replacement [] Arthritis [] back Surgeries  []  Spinal Stenosis    Hematologic:  [] Easy bruising  [] Easy bleeding   [] Hypercoagulable state   [] Anemic Gastrointestinal:  [] Diarrhea   [] Vomiting  [] Gastroesophageal reflux/heartburn   [] Difficulty swallowing. [] Abdominal pain Genitourinary:  [] Chronic kidney disease   [] Difficult urination  [] Anuric   [] Blood in urine [] Frequent urination  [] Burning with urination   [] Hematuria Skin:  [x] Rashes   [] Ulcers [] Wounds Psychological:  [] History of anxiety   []  History of major depression  []  Memory Difficulties      OBJECTIVE:   Physical Exam  BP (!) 169/98 (BP Location: Right Arm)   Pulse (!) 101   Resp 16   Wt (!) 332 lb (150.6 kg)   BMI 49.03 kg/m   Gen: WD/WN, NAD Head: [place]/AT, No temporalis wasting.  Ear/Nose/Throat: Hearing grossly intact, nares w/o erythema or drainage Eyes: PER, EOMI, sclera nonicteric.  Neck: Supple, no masses.  No JVD.  Pulmonary:  Good air movement, no use of accessory muscles.  Cardiac: RRR Vascular:  Vessel Right Left  Radial Palpable Palpable   Gastrointestinal: soft, non-distended. No guarding/no peritoneal signs.  Musculoskeletal: M/S 5/5 throughout.  No deformity or atrophy.  Neurologic: Pain and light touch intact in extremities.  Symmetrical.  Speech is fluent. Motor exam as listed above. Psychiatric: Judgment intact, Mood & affect appropriate for pt's clinical situation. Dermatologic:  Bilateral stasis dermatitis. No Ulcers Noted.  No  changes consistent with cellulitis. Lymph : No Cervical lymphadenopathy, bilateral dermal thickening       ASSESSMENT AND PLAN:  1. Bilateral lower leg cellulitis Today the patient does not have evidence of bilateral cellulitis, in fact felt better than they have in some time.  Patient does have swelling however located more so in his thighs versus his lower extremities.  Patient is advised to continue to have episodes of cellulitis he may need a infectious disease referral as he has had numerous recurrences.  2. Type 2 diabetes mellitus without complication, without Continue hypoglycemic medications as already ordered, these medications have been reviewed and there are no changes at  this time.  Hgb A1C to be monitored as already arranged by primary service   3. Osteoarthritis involving multiple joints Continue NSAID medications as already ordered, these medications have been reviewed and there are no changes at this time.  Continued activity and therapy was stressed.    Current Outpatient Medications on File Prior to Visit  Medication Sig Dispense Refill  . albuterol (VENTOLIN HFA) 108 (90 Base) MCG/ACT inhaler Inhale 2 puffs into the lungs every 4 (four) hours as needed for wheezing or shortness of breath.     Marland Kitchen aspirin-acetaminophen-caffeine (EXCEDRIN MIGRAINE) 250-250-65 MG tablet Take by mouth every 6 (six) hours as needed for headache.    Marland Kitchen atorvastatin (LIPITOR) 20 MG tablet Take 20 mg by mouth at bedtime.     Marland Kitchen buPROPion (WELLBUTRIN XL) 300 MG 24 hr tablet Take 300 mg by mouth daily.    . cholecalciferol (VITAMIN D3) 25 MCG (1000 UT) tablet Take 1 tablet (1,000 Units total) by mouth daily. 30 tablet 5  . Fluticasone-Salmeterol (ADVAIR DISKUS) 100-50 MCG/DOSE AEPB Inhale 1 puff into the lungs 2 (two) times daily.     . hydrochlorothiazide (MICROZIDE) 12.5 MG capsule Take 12.5 mg by mouth daily.   0  . loratadine (CLARITIN) 10 MG tablet Take 10 mg by mouth daily.    Marland Kitchen losartan  (COZAAR) 50 MG tablet Take 1 tablet (50 mg total) by mouth daily. 30 tablet 0  . Magnesium 500 MG CAPS Take 1 cap (500 mg) PO HS and in AM 60 capsule 0  . metFORMIN (GLUCOPHAGE) 1000 MG tablet Take 1,000 mg by mouth 2 (two) times daily with a meal.    . oxybutynin (DITROPAN) 5 MG tablet Take 5 mg by mouth every 12 (twelve) hours.    . pantoprazole (PROTONIX) 40 MG tablet Take 40 mg by mouth 2 (two) times daily.     . calcium carbonate (CALCIUM 600) 600 MG TABS tablet Take 1 tablet (600 mg total) by mouth 2 (two) times daily with a meal for 30 days. 60 tablet 0   No current facility-administered medications on file prior to visit.     There are no Patient Instructions on file for this visit. No follow-ups on file.   Kris Hartmann, NP  This note was completed with Sales executive.  Any errors are purely unintentional.

## 2018-09-09 ENCOUNTER — Ambulatory Visit (INDEPENDENT_AMBULATORY_CARE_PROVIDER_SITE_OTHER): Payer: Medicaid Other | Admitting: Vascular Surgery

## 2018-09-09 ENCOUNTER — Encounter (INDEPENDENT_AMBULATORY_CARE_PROVIDER_SITE_OTHER): Payer: Self-pay | Admitting: Nurse Practitioner

## 2018-09-09 ENCOUNTER — Encounter (INDEPENDENT_AMBULATORY_CARE_PROVIDER_SITE_OTHER): Payer: Self-pay

## 2018-09-09 ENCOUNTER — Other Ambulatory Visit: Payer: Self-pay

## 2018-09-09 ENCOUNTER — Ambulatory Visit (INDEPENDENT_AMBULATORY_CARE_PROVIDER_SITE_OTHER): Payer: Medicare Other | Admitting: Nurse Practitioner

## 2018-09-09 VITALS — BP 180/91 | HR 102 | Resp 18 | Wt 333.0 lb

## 2018-09-09 DIAGNOSIS — L97221 Non-pressure chronic ulcer of left calf limited to breakdown of skin: Secondary | ICD-10-CM

## 2018-09-09 DIAGNOSIS — L03116 Cellulitis of left lower limb: Secondary | ICD-10-CM

## 2018-09-09 DIAGNOSIS — L03115 Cellulitis of right lower limb: Secondary | ICD-10-CM

## 2018-09-09 DIAGNOSIS — E119 Type 2 diabetes mellitus without complications: Secondary | ICD-10-CM

## 2018-09-09 MED ORDER — SULFAMETHOXAZOLE-TRIMETHOPRIM 400-80 MG PO TABS: 1.0000 | ORAL_TABLET | Freq: Two times a day (BID) | ORAL | 0 refills | Status: DC

## 2018-09-09 NOTE — Progress Notes (Signed)
SUBJECTIVE:  Patient ID: [name], male    DOB: 1965/05/14, 53 y.o.   MRN: [id number] Chief Complaint  Patient presents with  . Follow-up    HPI  [name] is a 53 y.o. male the presents today for evaluation as well as leg swelling.  Previously the patient presented with swelling that had crept up into his thigh area.  This has resolved with a short treatment of diuretics.  However, the patient states that he feels as if the cellulitis is still present in his right lower extremity.  Both the patient's lower extremities are erythematous with the left being worse than the right.  Patient denies any fever, chills, nausea, vomiting or diarrhea.  He denies any chest pain or shortness of breath.  Past Medical History:  Diagnosis Date  . COPD (chronic obstructive pulmonary disease) ([place])   . Coronary artery disease   . Diabetes mellitus without complication ([place])   . Hypertension   . Peptic ulcer   . Sleep apnea     Past Surgical History:  Procedure Laterality Date  . COLONOSCOPY WITH PROPOFOL N/A 11/09/2016   Procedure: COLONOSCOPY WITH PROPOFOL;  Surgeon: Lollie Sails, MD;  Location: [facility] ENDOSCOPY;  Service: Endoscopy;  Laterality: N/A;  . COLONOSCOPY WITH PROPOFOL N/A 07/06/2017   Procedure: COLONOSCOPY WITH PROPOFOL;  Surgeon: Lollie Sails, MD;  Location: [facility] ENDOSCOPY;  Service: Endoscopy;  Laterality: N/A;  . ESOPHAGOGASTRODUODENOSCOPY (EGD) WITH PROPOFOL N/A 11/09/2016   Procedure: ESOPHAGOGASTRODUODENOSCOPY (EGD) WITH PROPOFOL;  Surgeon: Lollie Sails, MD;  Location: [facility] ENDOSCOPY;  Service: Endoscopy;  Laterality: N/A;  . ESOPHAGOGASTRODUODENOSCOPY (EGD) WITH PROPOFOL N/A 07/06/2017   Procedure: ESOPHAGOGASTRODUODENOSCOPY (EGD) WITH PROPOFOL;  Surgeon: Lollie Sails, MD;  Location: [facility] ENDOSCOPY;  Service: Endoscopy;  Laterality: N/A;    Social History   Socioeconomic History  . Marital status: Single    Spouse name: Not on file  .  Number of children: Not on file  . Years of education: Not on file  . Highest education level: Not on file  Occupational History  . Not on file  Social Needs  . Financial resource strain: Not on file  . Food insecurity    Worry: Not on file    Inability: Not on file  . Transportation needs    Medical: Not on file    Non-medical: Not on file  Tobacco Use  . Smoking status: Current Every Day Smoker    Packs/day: 1.00    Years: 30.00    Pack years: 30.00    Types: Cigarettes  . Smokeless tobacco: Never Used  . Tobacco comment: trying decrease  Substance and Sexual Activity  . Alcohol use: No  . Drug use: No  . Sexual activity: Never  Lifestyle  . Physical activity    Days per week: Not on file    Minutes per session: Not on file  . Stress: Not on file  Relationships  . Social Herbalist on phone: Not on file    Gets together: Not on file    Attends religious service: Not on file    Active member of club or organization: Not on file    Attends meetings of clubs or organizations: Not on file    Relationship status: Not on file  . Intimate partner violence    Fear of current or ex partner: Not on file    Emotionally abused: Not on file    Physically abused: Not on file  Forced sexual activity: Not on file  Other Topics Concern  . Not on file  Social History Narrative  . Not on file    Family History  Problem Relation Age of Onset  . Diabetes Mother   . Diabetes Father   . Stomach cancer Father     Allergies  Allergen Reactions  . Shellfish Allergy Swelling  . Codeine Rash  . Lisinopril Cough     Review of Systems   Review of Systems: Negative Unless Checked Constitutional: [] Weight loss  [] Fever  [] Chills Cardiac: [] Chest pain   []  Atrial Fibrillation  [] Palpitations   [] Shortness of breath when laying flat   [] Shortness of breath with exertion. [] Shortness of breath at rest Vascular:  [] Pain in legs with walking   [] Pain in legs with  standing [] Pain in legs when laying flat   [] Claudication    [] Pain in feet when laying flat    [] History of DVT   [] Phlebitis   [x] Swelling in legs   [] Varicose veins   [] Non-healing ulcers Pulmonary:   [] Uses home oxygen   [] Productive cough   [] Hemoptysis   [] Wheeze  [x] COPD   [] Asthma Neurologic:  [] Dizziness   [] Seizures  [] Blackouts [] History of stroke   [] History of TIA  [] Aphasia   [] Temporary Blindness   [] Weakness or numbness in arm   [] Weakness or numbness in leg Musculoskeletal:   [] Joint swelling   [] Joint pain   [] Low back pain  []  History of Knee Replacement [x] Arthritis [] back Surgeries  []  Spinal Stenosis    Hematologic:  [] Easy bruising  [] Easy bleeding   [] Hypercoagulable state   [] Anemic Gastrointestinal:  [] Diarrhea   [] Vomiting  [] Gastroesophageal reflux/heartburn   [] Difficulty swallowing. [] Abdominal pain Genitourinary:  [] Chronic kidney disease   [] Difficult urination  [] Anuric   [] Blood in urine [] Frequent urination  [] Burning with urination   [] Hematuria Skin:  [x] Rashes   [] Ulcers [] Wounds Psychological:  [] History of anxiety   []  History of major depression  []  Memory Difficulties      OBJECTIVE:   Physical Exam  BP (!) 180/91 (BP Location: Right Arm)   Pulse (!) 102   Resp 18   Wt (!) 333 lb (151 kg)   BMI 46.44 kg/m   Gen: WD/WN, NAD Head: [place]/AT, No temporalis wasting.  Ear/Nose/Throat: Hearing grossly intact, nares w/o erythema or drainage Eyes: PER, EOMI, sclera nonicteric.  Neck: Supple, no masses.  No JVD.  Pulmonary:  Good air movement, no use of accessory muscles.  Cardiac: RRR Vascular:  4+ edema bilaterally with changes consistent with cellulitis.  Shallow ulceration on left lower extremity Vessel Right Left  Radial Palpable Palpable   Gastrointestinal: soft, non-distended. No guarding/no peritoneal signs.  Musculoskeletal: M/S 5/5 throughout.  No deformity or atrophy.  Neurologic: Pain and light touch intact in extremities.  Symmetrical.   Speech is fluent. Motor exam as listed above. Psychiatric: Judgment intact, Mood & affect appropriate for pt's clinical situation. Dermatologic: No Venous rashes. No Ulcers Noted.    Bilateral cellulitis Lymph : No Cervical lymphadenopathy, no lichenification or skin thickening bilaterally      ASSESSMENT AND PLAN:  1. Bilateral lower leg cellulitis Despite repeated treatments the patient continues to have bilateral cellulitis.  This may be partially due to hygiene but is also a component of noncompliance.  The patient will allow his wraps to become wet or to slide out of place without notifying our office.  We have told him on repeated basis that if they become wet or  remove out of place that we would be happy to change them for him.  The patient also has instances to where he does not complete antibiotics as prescribed.  If patient continues to have recurrent cellulitis after this next round of antibiotics and treatment in wraps we may discuss referral to an infectious disease specialist - sulfamethoxazole-trimethoprim (BACTRIM) 400-80 MG tablet; Take 1 tablet by mouth 2 (two) times daily.  Dispense: 20 tablet; Refill: 0  2. Type 2 diabetes mellitus without complication, without long-term current use of insulin (HCC) Continue hypoglycemic medications as already ordered, these medications have been reviewed and there are no changes at this time.  Hgb A1C to be monitored as already arranged by primary service   3. Lower limb ulcer, calf, left, limited to breakdown of skin ([place]) No surgery or intervention at this point in time.    I have had a long discussion with the patient regarding venous insufficiency and why it  causes symptoms, specifically venous ulceration . I have discussed with the patient the chronic skin changes that accompany venous insufficiency and the long term sequela such as infection and recurring  ulceration.  Patient will be placed in [employer] which will be changed weekly  drainage permitting.  In addition, behavioral modification including several periods of elevation of the lower extremities during the day will be continued. Achieving a position with the ankles at heart level was stressed to the patient  The patient is instructed to begin routine exercise, especially walking on a daily basis       Current Outpatient Medications on File Prior to Visit  Medication Sig Dispense Refill  . albuterol (VENTOLIN HFA) 108 (90 Base) MCG/ACT inhaler Inhale 2 puffs into the lungs every 4 (four) hours as needed for wheezing or shortness of breath.     Marland Kitchen aspirin-acetaminophen-caffeine (EXCEDRIN MIGRAINE) 250-250-65 MG tablet Take by mouth every 6 (six) hours as needed for headache.    Marland Kitchen atorvastatin (LIPITOR) 20 MG tablet Take 20 mg by mouth at bedtime.     Marland Kitchen buPROPion (WELLBUTRIN XL) 300 MG 24 hr tablet Take 300 mg by mouth daily.    . cholecalciferol (VITAMIN D3) 25 MCG (1000 UT) tablet Take 1 tablet (1,000 Units total) by mouth daily. 30 tablet 5  . Fluticasone-Salmeterol (ADVAIR DISKUS) 100-50 MCG/DOSE AEPB Inhale 1 puff into the lungs 2 (two) times daily.     . furosemide (LASIX) 20 MG tablet Take 1 tablet (20 mg total) by mouth daily. 14 tablet 0  . hydrochlorothiazide (MICROZIDE) 12.5 MG capsule Take 12.5 mg by mouth daily.   0  . loratadine (CLARITIN) 10 MG tablet Take 10 mg by mouth daily.    Marland Kitchen losartan (COZAAR) 50 MG tablet Take 1 tablet (50 mg total) by mouth daily. 30 tablet 0  . Magnesium 500 MG CAPS Take 1 cap (500 mg) PO HS and in AM 60 capsule 0  . [START ON 09/30/2018] meloxicam (MOBIC) 15 MG tablet Take 1 tablet (15 mg total) by mouth daily. 30 tablet 5  . metFORMIN (GLUCOPHAGE) 1000 MG tablet Take 1,000 mg by mouth 2 (two) times daily with a meal.    . oxybutynin (DITROPAN) 5 MG tablet Take 5 mg by mouth every 12 (twelve) hours.    . pantoprazole (PROTONIX) 40 MG tablet Take 40 mg by mouth 2 (two) times daily.     Derrill Memo ON 09/30/2018] tiZANidine  (ZANAFLEX) 4 MG tablet Take 1 tablet (4 mg total) by mouth every 8 (  eight) hours as needed for muscle spasms. 90 tablet 5  . [START ON 09/30/2018] traMADol (ULTRAM) 50 MG tablet Take 1 tablet (50 mg total) by mouth every 6 (six) hours as needed for severe pain. Must last 30 days Max: 4/day 120 tablet 5  . calcium carbonate (CALCIUM 600) 600 MG TABS tablet Take 1 tablet (600 mg total) by mouth 2 (two) times daily with a meal for 30 days. 60 tablet 0   No current facility-administered medications on file prior to visit.     There are no Patient Instructions on file for this visit. No follow-ups on file.   Kris Hartmann, NP  This note was completed with Sales executive.  Any errors are purely unintentional.

## 2018-09-12 ENCOUNTER — Telehealth (INDEPENDENT_AMBULATORY_CARE_PROVIDER_SITE_OTHER): Payer: Self-pay

## 2018-09-12 NOTE — Telephone Encounter (Signed)
Pharmacy has been made aware with Eulogio Ditch NP medical advice

## 2018-09-12 NOTE — Telephone Encounter (Signed)
The most recent labs showed that the patient had normal renal function, so yes please continue

## 2018-09-16 ENCOUNTER — Ambulatory Visit (INDEPENDENT_AMBULATORY_CARE_PROVIDER_SITE_OTHER): Payer: Medicare Other | Admitting: Nurse Practitioner

## 2018-09-16 ENCOUNTER — Other Ambulatory Visit: Payer: Self-pay

## 2018-09-16 ENCOUNTER — Encounter (INDEPENDENT_AMBULATORY_CARE_PROVIDER_SITE_OTHER): Payer: Self-pay | Admitting: Nurse Practitioner

## 2018-09-16 VITALS — BP 148/67 | HR 74 | Resp 16 | Ht 69.0 in | Wt 334.0 lb

## 2018-09-16 DIAGNOSIS — L97221 Non-pressure chronic ulcer of left calf limited to breakdown of skin: Secondary | ICD-10-CM | POA: Diagnosis not present

## 2018-09-16 NOTE — Progress Notes (Signed)
History of Present Illness  There is no documented history at this time  Assessments & Plan   There are no diagnoses linked to this encounter.    Additional instructions  Subjective:  Patient presents with venous ulcer of the Bilateral lower extremity.    Procedure:  3 layer unna wrap was placed Bilateral lower extremity.   Plan:   Follow up in one week.  

## 2018-09-23 ENCOUNTER — Ambulatory Visit (INDEPENDENT_AMBULATORY_CARE_PROVIDER_SITE_OTHER): Payer: Medicare Other

## 2018-09-23 ENCOUNTER — Other Ambulatory Visit: Payer: Self-pay

## 2018-09-23 ENCOUNTER — Encounter (INDEPENDENT_AMBULATORY_CARE_PROVIDER_SITE_OTHER): Payer: Self-pay | Admitting: Nurse Practitioner

## 2018-09-23 VITALS — BP 162/100 | HR 106 | Resp 16 | Ht 69.0 in | Wt 326.0 lb

## 2018-09-23 DIAGNOSIS — L97221 Non-pressure chronic ulcer of left calf limited to breakdown of skin: Secondary | ICD-10-CM

## 2018-09-23 NOTE — Progress Notes (Signed)
History of Present Illness  There is no documented history at this time  Assessments & Plan   There are no diagnoses linked to this encounter.    Additional instructions  Subjective:  Patient presents with venous ulcer of the Bilateral lower extremity.    Procedure:  3 layer unna wrap was placed Bilateral lower extremity.   Plan:   Follow up in one week.  

## 2018-09-30 ENCOUNTER — Other Ambulatory Visit: Payer: Self-pay

## 2018-09-30 ENCOUNTER — Encounter (INDEPENDENT_AMBULATORY_CARE_PROVIDER_SITE_OTHER): Payer: Self-pay

## 2018-09-30 ENCOUNTER — Ambulatory Visit (INDEPENDENT_AMBULATORY_CARE_PROVIDER_SITE_OTHER): Payer: Medicare Other | Admitting: Nurse Practitioner

## 2018-09-30 VITALS — BP 156/90 | HR 96 | Resp 18 | Wt 331.0 lb

## 2018-09-30 DIAGNOSIS — L97221 Non-pressure chronic ulcer of left calf limited to breakdown of skin: Secondary | ICD-10-CM

## 2018-09-30 NOTE — Progress Notes (Signed)
History of Present Illness  There is no documented history at this time  Assessments & Plan   There are no diagnoses linked to this encounter.    Additional instructions  Subjective:  Patient presents with venous ulcer of the Bilateral lower extremity.    Procedure:  3 layer unna wrap was placed Bilateral lower extremity.   Plan:   Follow up in one week.  

## 2018-10-07 ENCOUNTER — Other Ambulatory Visit: Payer: Self-pay

## 2018-10-07 ENCOUNTER — Ambulatory Visit (INDEPENDENT_AMBULATORY_CARE_PROVIDER_SITE_OTHER): Payer: Medicare Other | Admitting: Nurse Practitioner

## 2018-10-07 ENCOUNTER — Encounter (INDEPENDENT_AMBULATORY_CARE_PROVIDER_SITE_OTHER): Payer: Self-pay

## 2018-10-07 VITALS — BP 144/89 | HR 94 | Resp 16 | Wt 327.0 lb

## 2018-10-07 DIAGNOSIS — L97221 Non-pressure chronic ulcer of left calf limited to breakdown of skin: Secondary | ICD-10-CM | POA: Diagnosis not present

## 2018-10-07 NOTE — Progress Notes (Signed)
History of Present Illness  There is no documented history at this time  Assessments & Plan   There are no diagnoses linked to this encounter.    Additional instructions  Subjective:  Patient presents with venous ulcer of the Bilateral lower extremity.    Procedure:  3 layer unna wrap was placed Bilateral lower extremity.   Plan:   Follow up in one week.  

## 2018-10-14 ENCOUNTER — Ambulatory Visit (INDEPENDENT_AMBULATORY_CARE_PROVIDER_SITE_OTHER): Payer: Medicare Other | Admitting: Nurse Practitioner

## 2018-10-14 ENCOUNTER — Encounter (INDEPENDENT_AMBULATORY_CARE_PROVIDER_SITE_OTHER): Payer: Self-pay | Admitting: Nurse Practitioner

## 2018-10-14 ENCOUNTER — Other Ambulatory Visit: Payer: Self-pay

## 2018-10-14 VITALS — BP 144/93 | HR 105 | Ht 69.0 in | Wt 325.0 lb

## 2018-10-14 DIAGNOSIS — I10 Essential (primary) hypertension: Secondary | ICD-10-CM | POA: Diagnosis not present

## 2018-10-14 DIAGNOSIS — L03116 Cellulitis of left lower limb: Secondary | ICD-10-CM | POA: Diagnosis not present

## 2018-10-14 DIAGNOSIS — I890 Lymphedema, not elsewhere classified: Secondary | ICD-10-CM

## 2018-10-14 MED ORDER — DOXYCYCLINE HYCLATE 100 MG PO CAPS: 100.0000 mg | ORAL_CAPSULE | Freq: Two times a day (BID) | ORAL | 0 refills | Status: DC

## 2018-10-19 ENCOUNTER — Encounter (INDEPENDENT_AMBULATORY_CARE_PROVIDER_SITE_OTHER): Payer: Self-pay | Admitting: Nurse Practitioner

## 2018-10-19 NOTE — Progress Notes (Signed)
SUBJECTIVE:  Patient ID: [name], male    DOB: 07/16/1965, 53 y.o.   MRN: [id number] Chief Complaint  Patient presents with  . Follow-up    unna boot check    HPI  HOLLAN Bell is a 53 y.o. male presents today for evaluation.  Today the patient's lower extremity edema actually appears much improved so did his ulcerations.  However the patient has complaints of recurrent cellulitis.  The patient has been treated multiple times for cellulitis.  He has been treated with different antibiotics as well as several times with IV antibiotics with hospitalization however it seems to be recurrent.  The patient continues to smoke.  The patient sometimes is noncompliant with his treatment plan.  However the patient states that he recently has been utilizing his lymphedema pump as prescribed.  He denies any fever, chills, nausea, vomiting or diarrhea.  Denies any chest pain or shortness of breath.  Past Medical History:  Diagnosis Date  . COPD (chronic obstructive pulmonary disease) ([place])   . Coronary artery disease   . Diabetes mellitus without complication ([place])   . Hypertension   . Peptic ulcer   . Sleep apnea     Past Surgical History:  Procedure Laterality Date  . COLONOSCOPY WITH PROPOFOL N/A 11/09/2016   Procedure: COLONOSCOPY WITH PROPOFOL;  Surgeon: Lollie Sails, MD;  Location: [facility] ENDOSCOPY;  Service: Endoscopy;  Laterality: N/A;  . COLONOSCOPY WITH PROPOFOL N/A 07/06/2017   Procedure: COLONOSCOPY WITH PROPOFOL;  Surgeon: Lollie Sails, MD;  Location: [facility] ENDOSCOPY;  Service: Endoscopy;  Laterality: N/A;  . ESOPHAGOGASTRODUODENOSCOPY (EGD) WITH PROPOFOL N/A 11/09/2016   Procedure: ESOPHAGOGASTRODUODENOSCOPY (EGD) WITH PROPOFOL;  Surgeon: Lollie Sails, MD;  Location: [facility] ENDOSCOPY;  Service: Endoscopy;  Laterality: N/A;  . ESOPHAGOGASTRODUODENOSCOPY (EGD) WITH PROPOFOL N/A 07/06/2017   Procedure: ESOPHAGOGASTRODUODENOSCOPY (EGD) WITH PROPOFOL;  Surgeon:  Lollie Sails, MD;  Location: [facility] ENDOSCOPY;  Service: Endoscopy;  Laterality: N/A;    Social History   Socioeconomic History  . Marital status: Single    Spouse name: Not on file  . Number of children: Not on file  . Years of education: Not on file  . Highest education level: Not on file  Occupational History  . Not on file  Social Needs  . Financial resource strain: Not on file  . Food insecurity    Worry: Not on file    Inability: Not on file  . Transportation needs    Medical: Not on file    Non-medical: Not on file  Tobacco Use  . Smoking status: Current Every Day Smoker    Packs/day: 1.00    Years: 30.00    Pack years: 30.00    Types: Cigarettes  . Smokeless tobacco: Never Used  . Tobacco comment: trying decrease  Substance and Sexual Activity  . Alcohol use: No  . Drug use: No  . Sexual activity: Never  Lifestyle  . Physical activity    Days per week: Not on file    Minutes per session: Not on file  . Stress: Not on file  Relationships  . Social Herbalist on phone: Not on file    Gets together: Not on file    Attends religious service: Not on file    Active member of club or organization: Not on file    Attends meetings of clubs or organizations: Not on file    Relationship status: Not on file  . Intimate partner violence  Fear of current or ex partner: Not on file    Emotionally abused: Not on file    Physically abused: Not on file    Forced sexual activity: Not on file  Other Topics Concern  . Not on file  Social History Narrative  . Not on file    Family History  Problem Relation Age of Onset  . Diabetes Mother   . Diabetes Father   . Stomach cancer Father     Allergies  Allergen Reactions  . Shellfish Allergy Swelling  . Codeine Rash  . Lisinopril Cough     Review of Systems   Review of Systems: Negative Unless Checked Constitutional: [] Weight loss  [] Fever  [] Chills Cardiac: [] Chest pain   []  Atrial  Fibrillation  [] Palpitations   [] Shortness of breath when laying flat   [] Shortness of breath with exertion. [] Shortness of breath at rest Vascular:  [] Pain in legs with walking   [] Pain in legs with standing [] Pain in legs when laying flat   [] Claudication    [] Pain in feet when laying flat    [] History of DVT   [] Phlebitis   [x] Swelling in legs   [] Varicose veins   [] Non-healing ulcers Pulmonary:   [] Uses home oxygen   [] Productive cough   [] Hemoptysis   [] Wheeze  [] COPD   [] Asthma Neurologic:  [] Dizziness   [] Seizures  [] Blackouts [] History of stroke   [] History of TIA  [] Aphasia   [] Temporary Blindness   [] Weakness or numbness in arm   [] Weakness or numbness in leg Musculoskeletal:   [] Joint swelling   [] Joint pain   [] Low back pain  []  History of Knee Replacement [x] Arthritis [] back Surgeries  []  Spinal Stenosis    Hematologic:  [] Easy bruising  [] Easy bleeding   [] Hypercoagulable state   [] Anemic Gastrointestinal:  [] Diarrhea   [] Vomiting  [] Gastroesophageal reflux/heartburn   [] Difficulty swallowing. [] Abdominal pain Genitourinary:  [] Chronic kidney disease   [] Difficult urination  [] Anuric   [] Blood in urine [] Frequent urination  [] Burning with urination   [] Hematuria Skin:  [x] Rashes   [] Ulcers [] Wounds Psychological:  [] History of anxiety   []  History of major depression  []  Memory Difficulties      OBJECTIVE:   Physical Exam  BP (!) 144/93 (BP Location: Right Arm)   Pulse (!) 105   Ht 5\' 9"  (1.753 m)   Wt (!) 325 lb (147.4 kg)   BMI 47.99 kg/m   Gen: WD/WN, NAD Head: [place]/AT, No temporalis wasting.  Ear/Nose/Throat: Hearing grossly intact, nares w/o erythema or drainage Eyes: PER, EOMI, sclera nonicteric.  Neck: Supple, no masses.  No JVD.  Pulmonary:  Good air movement, no use of accessory muscles.  Cardiac: RRR Vascular:  Vessel Right Left  Radial Palpable Palpable              Dorsalis Pedis Palpable Palpable  Posterior Tibial Palpable Palpable   Gastrointestinal:  soft, non-distended. No guarding/no peritoneal signs.  Musculoskeletal: M/S 5/5 throughout.  No deformity or atrophy.  Neurologic: Pain and light touch intact in extremities.  Symmetrical.  Speech is fluent. Motor exam as listed above. Psychiatric: Judgment intact, Mood & affect appropriate for pt's clinical situation. Dermatologic:  Bilateral stasis dermatitis.  Previously ulcers.   Changes consistent with cellulitis up to mid shin.   Lymph : No Cervical lymphadenopathy, dermal thickening bilaterally       ASSESSMENT AND PLAN:  1. Cellulitis of left lower extremity We will send in a prescription for doxycycline at this time.  The patient  continues to have recurrent cellulitis.  There could be several causes to this due to the fact that the patient is frequently noncompliant with the right instructions such as not getting them wet, removing them or sometimes allowing them to slide down and cause irritation.  However, will refer the patient to infectious disease to see if there are any other treatment options to help resolve cellulitis so that it does not recur on such a regular basis. - Ambulatory referral to Infectious Disease  2. Lymphedema Currently the patient's ulcerations have healed and his swelling is under good control.  However, previously the patient has not done well out of Unna wraps for extended periods of time.  We will take the patient out of Unna wraps today however we will have him return for wrap reapplication in 1 week.  We will then reevaluate the patient's progression with an office visit in 6 weeks.  3. Essential hypertension, benign Continue antihypertensive medications as already ordered, these medications have been reviewed and there are no changes at this time.    Current Outpatient Medications on File Prior to Visit  Medication Sig Dispense Refill  . albuterol (VENTOLIN HFA) 108 (90 Base) MCG/ACT inhaler Inhale 2 puffs into the lungs every 4 (four) hours as needed  for wheezing or shortness of breath.     Marland Kitchen aspirin-acetaminophen-caffeine (EXCEDRIN MIGRAINE) 250-250-65 MG tablet Take by mouth every 6 (six) hours as needed for headache.    Marland Kitchen atorvastatin (LIPITOR) 20 MG tablet Take 20 mg by mouth at bedtime.     Marland Kitchen buPROPion (WELLBUTRIN XL) 300 MG 24 hr tablet Take 300 mg by mouth daily.    . cholecalciferol (VITAMIN D3) 25 MCG (1000 UT) tablet Take 1 tablet (1,000 Units total) by mouth daily. 30 tablet 5  . Fluticasone-Salmeterol (ADVAIR DISKUS) 100-50 MCG/DOSE AEPB Inhale 1 puff into the lungs 2 (two) times daily.     . furosemide (LASIX) 20 MG tablet Take 1 tablet (20 mg total) by mouth daily. 14 tablet 0  . gabapentin (NEURONTIN) 300 MG capsule     . hydrochlorothiazide (MICROZIDE) 12.5 MG capsule Take 12.5 mg by mouth daily.   0  . loratadine (CLARITIN) 10 MG tablet Take 10 mg by mouth daily.    Marland Kitchen losartan (COZAAR) 50 MG tablet Take 1 tablet (50 mg total) by mouth daily. 30 tablet 0  . Magnesium 500 MG CAPS Take 1 cap (500 mg) PO HS and in AM 60 capsule 0  . meloxicam (MOBIC) 15 MG tablet Take 1 tablet (15 mg total) by mouth daily. 30 tablet 5  . metFORMIN (GLUCOPHAGE) 1000 MG tablet Take 1,000 mg by mouth 2 (two) times daily with a meal.    . oxybutynin (DITROPAN) 5 MG tablet Take 5 mg by mouth every 12 (twelve) hours.    . pantoprazole (PROTONIX) 40 MG tablet Take 40 mg by mouth 2 (two) times daily.     Marland Kitchen tiZANidine (ZANAFLEX) 4 MG tablet Take 1 tablet (4 mg total) by mouth every 8 (eight) hours as needed for muscle spasms. 90 tablet 5  . traMADol (ULTRAM) 50 MG tablet Take 1 tablet (50 mg total) by mouth every 6 (six) hours as needed for severe pain. Must last 30 days Max: 4/day 120 tablet 5  . calcium carbonate (CALCIUM 600) 600 MG TABS tablet Take 1 tablet (600 mg total) by mouth 2 (two) times daily with a meal for 30 days. 60 tablet 0  . sulfamethoxazole-trimethoprim (BACTRIM) 400-80 MG tablet  Take 1 tablet by mouth 2 (two) times daily. (Patient not  taking: Reported on 10/07/2018) 20 tablet 0   No current facility-administered medications on file prior to visit.     There are no Patient Instructions on file for this visit. No follow-ups on file.   Kris Hartmann, NP  This note was completed with Sales executive.  Any errors are purely unintentional.

## 2018-10-21 ENCOUNTER — Other Ambulatory Visit: Payer: Self-pay

## 2018-10-21 ENCOUNTER — Encounter (INDEPENDENT_AMBULATORY_CARE_PROVIDER_SITE_OTHER): Payer: Self-pay

## 2018-10-21 ENCOUNTER — Ambulatory Visit (INDEPENDENT_AMBULATORY_CARE_PROVIDER_SITE_OTHER): Payer: Medicare Other | Admitting: Nurse Practitioner

## 2018-10-21 VITALS — BP 153/92 | HR 92 | Resp 16 | Wt 328.0 lb

## 2018-10-21 DIAGNOSIS — I890 Lymphedema, not elsewhere classified: Secondary | ICD-10-CM

## 2018-10-21 NOTE — Progress Notes (Signed)
History of Present Illness  There is no documented history at this time  Assessments & Plan   There are no diagnoses linked to this encounter.    Additional instructions  Subjective:  Patient presents with venous ulcer of the Bilateral lower extremity.    Procedure:  3 layer unna wrap was placed Bilateral lower extremity.   Plan:   Follow up in one week.  

## 2018-10-28 ENCOUNTER — Other Ambulatory Visit: Payer: Self-pay

## 2018-10-28 ENCOUNTER — Ambulatory Visit (INDEPENDENT_AMBULATORY_CARE_PROVIDER_SITE_OTHER): Payer: Medicare Other | Admitting: Nurse Practitioner

## 2018-10-28 ENCOUNTER — Encounter (INDEPENDENT_AMBULATORY_CARE_PROVIDER_SITE_OTHER): Payer: Self-pay | Admitting: Nurse Practitioner

## 2018-10-28 VITALS — BP 174/112 | HR 112 | Resp 14 | Ht 69.0 in | Wt 329.0 lb

## 2018-10-28 DIAGNOSIS — I890 Lymphedema, not elsewhere classified: Secondary | ICD-10-CM

## 2018-10-28 NOTE — Progress Notes (Signed)
History of Present Illness  There is no documented history at this time  Assessments & Plan   There are no diagnoses linked to this encounter.    Additional instructions  Subjective:  Patient presents with venous ulcer of the Bilateral lower extremity.    Procedure:  3 layer unna wrap was placed Bilateral lower extremity.   Plan:   Follow up in one week.  

## 2018-11-04 ENCOUNTER — Encounter (INDEPENDENT_AMBULATORY_CARE_PROVIDER_SITE_OTHER): Payer: Self-pay

## 2018-11-04 ENCOUNTER — Ambulatory Visit (INDEPENDENT_AMBULATORY_CARE_PROVIDER_SITE_OTHER): Payer: Medicare Other | Admitting: Nurse Practitioner

## 2018-11-04 ENCOUNTER — Other Ambulatory Visit: Payer: Self-pay

## 2018-11-04 VITALS — BP 156/90 | HR 98 | Resp 20 | Ht 69.0 in | Wt 329.0 lb

## 2018-11-04 DIAGNOSIS — I890 Lymphedema, not elsewhere classified: Secondary | ICD-10-CM

## 2018-11-04 NOTE — Progress Notes (Signed)
History of Present Illness  There is no documented history at this time  Assessments & Plan   There are no diagnoses linked to this encounter.    Additional instructions  Subjective:  Patient presents with venous ulcer of the Bilateral lower extremity.    Procedure:  3 layer unna wrap was placed Bilateral lower extremity.   Plan:   Follow up in one week.  

## 2018-11-11 ENCOUNTER — Ambulatory Visit (INDEPENDENT_AMBULATORY_CARE_PROVIDER_SITE_OTHER): Payer: Medicare Other | Admitting: Nurse Practitioner

## 2018-11-11 ENCOUNTER — Other Ambulatory Visit: Payer: Self-pay

## 2018-11-11 ENCOUNTER — Encounter (INDEPENDENT_AMBULATORY_CARE_PROVIDER_SITE_OTHER): Payer: Self-pay | Admitting: Nurse Practitioner

## 2018-11-11 VITALS — BP 140/89 | HR 100 | Resp 22 | Ht 69.0 in | Wt 324.0 lb

## 2018-11-11 DIAGNOSIS — E119 Type 2 diabetes mellitus without complications: Secondary | ICD-10-CM

## 2018-11-11 DIAGNOSIS — L03116 Cellulitis of left lower limb: Secondary | ICD-10-CM

## 2018-11-11 DIAGNOSIS — L97919 Non-pressure chronic ulcer of unspecified part of right lower leg with unspecified severity: Secondary | ICD-10-CM

## 2018-11-11 DIAGNOSIS — I890 Lymphedema, not elsewhere classified: Secondary | ICD-10-CM | POA: Diagnosis not present

## 2018-11-11 DIAGNOSIS — F17200 Nicotine dependence, unspecified, uncomplicated: Secondary | ICD-10-CM

## 2018-11-11 DIAGNOSIS — I83019 Varicose veins of right lower extremity with ulcer of unspecified site: Secondary | ICD-10-CM | POA: Diagnosis not present

## 2018-11-11 NOTE — Progress Notes (Signed)
SUBJECTIVE:  Patient ID: [name], male    DOB: 1965-06-23, 53 y.o.   MRN: [id number] Chief Complaint  Patient presents with  . Follow-up    HPI  [name] is a 53 y.o. male presents today for Unna wrap check.  Today the patient's legs look remarkably better than the last several times that I have seen the the swelling is much better under control and there is no evidence of cellulitis.  The patient continues to have a small shallow ulceration on his posterior right calf.  No evidence of any ulceration on his left lower extremity.  The patient also states that his legs feel much better at this time.  The patient endorses continued use of his lymphedema pump as well as elevating when possible.  He denies any fever, chills, nausea, vomiting or diarrhea.  Past Medical History:  Diagnosis Date  . COPD (chronic obstructive pulmonary disease) ([place])   . Coronary artery disease   . Diabetes mellitus without complication ([place])   . Hypertension   . Peptic ulcer   . Sleep apnea     Past Surgical History:  Procedure Laterality Date  . COLONOSCOPY WITH PROPOFOL N/A 11/09/2016   Procedure: COLONOSCOPY WITH PROPOFOL;  Surgeon: Lollie Sails, MD;  Location: [facility] ENDOSCOPY;  Service: Endoscopy;  Laterality: N/A;  . COLONOSCOPY WITH PROPOFOL N/A 07/06/2017   Procedure: COLONOSCOPY WITH PROPOFOL;  Surgeon: Lollie Sails, MD;  Location: [facility] ENDOSCOPY;  Service: Endoscopy;  Laterality: N/A;  . ESOPHAGOGASTRODUODENOSCOPY (EGD) WITH PROPOFOL N/A 11/09/2016   Procedure: ESOPHAGOGASTRODUODENOSCOPY (EGD) WITH PROPOFOL;  Surgeon: Lollie Sails, MD;  Location: [facility] ENDOSCOPY;  Service: Endoscopy;  Laterality: N/A;  . ESOPHAGOGASTRODUODENOSCOPY (EGD) WITH PROPOFOL N/A 07/06/2017   Procedure: ESOPHAGOGASTRODUODENOSCOPY (EGD) WITH PROPOFOL;  Surgeon: Lollie Sails, MD;  Location: [facility] ENDOSCOPY;  Service: Endoscopy;  Laterality: N/A;    Social History   Socioeconomic History   . Marital status: Single    Spouse name: Not on file  . Number of children: Not on file  . Years of education: Not on file  . Highest education level: Not on file  Occupational History  . Not on file  Social Needs  . Financial resource strain: Not on file  . Food insecurity    Worry: Not on file    Inability: Not on file  . Transportation needs    Medical: Not on file    Non-medical: Not on file  Tobacco Use  . Smoking status: Current Every Day Smoker    Packs/day: 1.00    Years: 30.00    Pack years: 30.00    Types: Cigarettes  . Smokeless tobacco: Never Used  . Tobacco comment: trying decrease  Substance and Sexual Activity  . Alcohol use: No  . Drug use: No  . Sexual activity: Never  Lifestyle  . Physical activity    Days per week: Not on file    Minutes per session: Not on file  . Stress: Not on file  Relationships  . Social Herbalist on phone: Not on file    Gets together: Not on file    Attends religious service: Not on file    Active member of club or organization: Not on file    Attends meetings of clubs or organizations: Not on file    Relationship status: Not on file  . Intimate partner violence    Fear of current or ex partner: Not on file  Emotionally abused: Not on file    Physically abused: Not on file    Forced sexual activity: Not on file  Other Topics Concern  . Not on file  Social History Narrative  . Not on file    Family History  Problem Relation Age of Onset  . Diabetes Mother   . Diabetes Father   . Stomach cancer Father     Allergies  Allergen Reactions  . Shellfish Allergy Swelling  . Codeine Rash  . Lisinopril Cough     Review of Systems   Review of Systems: Negative Unless Checked Constitutional: [] Weight loss  [] Fever  [] Chills Cardiac: [] Chest pain   []  Atrial Fibrillation  [] Palpitations   [] Shortness of breath when laying flat   [] Shortness of breath with exertion. [] Shortness of breath at rest Vascular:   [] Pain in legs with walking   [] Pain in legs with standing [] Pain in legs when laying flat   [] Claudication    [] Pain in feet when laying flat    [] History of DVT   [] Phlebitis   [x] Swelling in legs   [x] Varicose veins   [x] Non-healing ulcers Pulmonary:   [] Uses home oxygen   [] Productive cough   [] Hemoptysis   [] Wheeze  [] COPD   [] Asthma Neurologic:  [] Dizziness   [] Seizures  [] Blackouts [] History of stroke   [] History of TIA  [] Aphasia   [] Temporary Blindness   [] Weakness or numbness in arm   [] Weakness or numbness in leg Musculoskeletal:   [] Joint swelling   [] Joint pain   [] Low back pain  []  History of Knee Replacement [x] Arthritis [] back Surgeries  []  Spinal Stenosis    Hematologic:  [] Easy bruising  [] Easy bleeding   [] Hypercoagulable state   [] Anemic Gastrointestinal:  [] Diarrhea   [] Vomiting  [] Gastroesophageal reflux/heartburn   [] Difficulty swallowing. [] Abdominal pain Genitourinary:  [] Chronic kidney disease   [] Difficult urination  [] Anuric   [] Blood in urine [] Frequent urination  [] Burning with urination   [] Hematuria Skin:  [x] Rashes   [x] Ulcers [] Wounds Psychological:  [x] History of anxiety   [x]  History of major depression  []  Memory Difficulties      OBJECTIVE:   Physical Exam  BP 140/89 (BP Location: Right Arm)   Pulse 100   Resp (!) 22   Ht 5\' 9"  (1.753 m)   Wt (!) 324 lb (147 kg)   BMI 47.85 kg/m   Gen: WD/WN, NAD Head: [place]/AT, No temporalis wasting.  Ear/Nose/Throat: Hearing grossly intact, nares w/o erythema or drainage Eyes: PER, EOMI, sclera nonicteric.  Neck: Supple, no masses.  No JVD.  Pulmonary:  Good air movement, no use of accessory muscles.  Cardiac: RRR Vascular:  Scattered varicosities bilaterally, 2+ edema bilaterally Vessel Right Left  Radial Palpable Palpable  Dorsalis Pedis Palpable Palpable  Posterior Tibial Palpable Palpable   Gastrointestinal: soft, non-distended. No guarding/no peritoneal signs.  Musculoskeletal: M/S 5/5 throughout.  No  deformity or atrophy.  Neurologic: Pain and light touch intact in extremities.  Symmetrical.  Speech is fluent. Motor exam as listed above. Psychiatric: Judgment intact, Mood & affect appropriate for pt's clinical situation. Dermatologic:  Bilateral stasis dermatitis.  Shallow ulceration posterior right calf   No changes consistent with cellulitis. Lymph : No Cervical lymphadenopathy, dermal thickening bilaterally with some lichenification       ASSESSMENT AND PLAN:  1. Lymphedema See below  2. Type 2 diabetes mellitus without complication, without long-term current use of insulin (HCC) Continue hypoglycemic medications as already ordered, these medications have been reviewed and there are no  changes at this time.  Hgb A1C to be monitored as already arranged by primary service   3. Venous ulcer of right leg (HCC) Today we will continue to wrap the patient with bilateral Unna wraps.  The patient continues to have a small ulceration on his right lower extremity posterior calf.  The patient will present to the office on a weekly basis to have his wraps changed.  We will reevaluate his lower extremities in 4 weeks.  The patient is instructed to continue to utilize his lymphedema pump on a daily basis.  He should continue to exercise at least 30 minutes daily and elevate his lower extremities.  4. Tobacco dependence Smoking cessation was discussed, 3-10 minutes spent on this topic specifically   5. Cellulitis of left lower extremity Resolved at this time.     Current Outpatient Medications on File Prior to Visit  Medication Sig Dispense Refill  . albuterol (VENTOLIN HFA) 108 (90 Base) MCG/ACT inhaler Inhale 2 puffs into the lungs every 4 (four) hours as needed for wheezing or shortness of breath.     Marland Kitchen aspirin-acetaminophen-caffeine (EXCEDRIN MIGRAINE) 250-250-65 MG tablet Take by mouth every 6 (six) hours as needed for headache.    Marland Kitchen atorvastatin (LIPITOR) 20 MG tablet Take 20 mg by  mouth at bedtime.     Marland Kitchen buPROPion (WELLBUTRIN XL) 300 MG 24 hr tablet Take 300 mg by mouth daily.    Marland Kitchen doxycycline (VIBRAMYCIN) 100 MG capsule Take 1 capsule (100 mg total) by mouth 2 (two) times daily. 28 capsule 0  . Fluticasone-Salmeterol (ADVAIR DISKUS) 100-50 MCG/DOSE AEPB Inhale 1 puff into the lungs 2 (two) times daily.     . furosemide (LASIX) 20 MG tablet Take 1 tablet (20 mg total) by mouth daily. 14 tablet 0  . gabapentin (NEURONTIN) 300 MG capsule     . hydrochlorothiazide (MICROZIDE) 12.5 MG capsule Take 12.5 mg by mouth daily.   0  . loratadine (CLARITIN) 10 MG tablet Take 10 mg by mouth daily.    Marland Kitchen losartan (COZAAR) 50 MG tablet Take 1 tablet (50 mg total) by mouth daily. 30 tablet 0  . Magnesium 500 MG CAPS Take 1 cap (500 mg) PO HS and in AM 60 capsule 0  . meloxicam (MOBIC) 15 MG tablet Take 1 tablet (15 mg total) by mouth daily. 30 tablet 5  . metFORMIN (GLUCOPHAGE) 1000 MG tablet Take 1,000 mg by mouth 2 (two) times daily with a meal.    . oxybutynin (DITROPAN) 5 MG tablet Take 5 mg by mouth every 12 (twelve) hours.    . pantoprazole (PROTONIX) 40 MG tablet Take 40 mg by mouth 2 (two) times daily.     Marland Kitchen sulfamethoxazole-trimethoprim (BACTRIM) 400-80 MG tablet Take 1 tablet by mouth 2 (two) times daily. 20 tablet 0  . tiZANidine (ZANAFLEX) 4 MG tablet Take 1 tablet (4 mg total) by mouth every 8 (eight) hours as needed for muscle spasms. 90 tablet 5  . traMADol (ULTRAM) 50 MG tablet Take 1 tablet (50 mg total) by mouth every 6 (six) hours as needed for severe pain. Must last 30 days Max: 4/day 120 tablet 5  . calcium carbonate (CALCIUM 600) 600 MG TABS tablet Take 1 tablet (600 mg total) by mouth 2 (two) times daily with a meal for 30 days. 60 tablet 0   No current facility-administered medications on file prior to visit.     There are no Patient Instructions on file for this visit. No follow-ups on  file.   Kris Hartmann, NP  This note was completed with Production manager.  Any errors are purely unintentional.

## 2018-11-17 ENCOUNTER — Encounter (INDEPENDENT_AMBULATORY_CARE_PROVIDER_SITE_OTHER): Payer: Medicare Other

## 2018-11-17 ENCOUNTER — Encounter (INDEPENDENT_AMBULATORY_CARE_PROVIDER_SITE_OTHER): Payer: Self-pay

## 2018-11-18 ENCOUNTER — Telehealth: Payer: Self-pay | Admitting: Pain Medicine

## 2018-11-18 NOTE — Telephone Encounter (Signed)
Patient called stating pharmacy is telling him he has no refills on meloxicam. I checked chart he has refills on script from Sept. He is going to call pharmacy again. Please check and call pharmacy

## 2018-11-18 NOTE — Telephone Encounter (Signed)
Patient called back stating he spoke with pharmacy and they do not have any refills on his med.

## 2018-11-21 ENCOUNTER — Telehealth: Payer: Self-pay

## 2018-11-21 ENCOUNTER — Other Ambulatory Visit: Payer: Self-pay | Admitting: Pain Medicine

## 2018-11-21 DIAGNOSIS — M159 Polyosteoarthritis, unspecified: Secondary | ICD-10-CM

## 2018-11-21 DIAGNOSIS — E559 Vitamin D deficiency, unspecified: Secondary | ICD-10-CM

## 2018-11-21 DIAGNOSIS — G894 Chronic pain syndrome: Secondary | ICD-10-CM

## 2018-11-21 DIAGNOSIS — G8929 Other chronic pain: Secondary | ICD-10-CM

## 2018-11-21 DIAGNOSIS — M47816 Spondylosis without myelopathy or radiculopathy, lumbar region: Secondary | ICD-10-CM

## 2018-11-21 MED ORDER — MELOXICAM 15 MG PO TABS: 15.0000 mg | ORAL_TABLET | Freq: Every day | ORAL | 1 refills | Status: DC

## 2018-11-21 MED ORDER — TIZANIDINE HCL 4 MG PO TABS: 4.0000 mg | ORAL_TABLET | Freq: Three times a day (TID) | ORAL | 5 refills | Status: DC | PRN

## 2018-11-21 MED ORDER — MAGNESIUM 500 MG PO CAPS: 500.0000 mg | ORAL_CAPSULE | Freq: Every day | ORAL | 3 refills | Status: DC

## 2018-11-21 MED ORDER — TRAMADOL HCL 50 MG PO TABS: 50.0000 mg | ORAL_TABLET | Freq: Four times a day (QID) | ORAL | 5 refills | Status: DC | PRN

## 2018-11-21 NOTE — Telephone Encounter (Signed)
Spoke with patient.  Prescriptions from last visit were sent to [facility].  Patient states he has never used this mail order pharmacy.  Patient states he uses [facility]. I changed this in Epic and message was sent to Dr Dossie Arbour to resend scripts to the correct pharmacy. I attempted to call [facility] and there are no choices to speak with a live person.

## 2018-11-21 NOTE — Progress Notes (Signed)
Prescriptions had to be resent today to the [facility].  Apparently, despite the fact that I had asked the patient to confirm the pharmacy for me before sending the other prescription, he confirmed the wrong one.

## 2018-11-21 NOTE — Telephone Encounter (Signed)
Myself and Alinda Sierras RN attempted to call [facility] and were unable to reach a person by phone to check on prescriptions. . There were no choices available to speak to anyone.

## 2018-11-25 ENCOUNTER — Other Ambulatory Visit: Payer: Self-pay

## 2018-11-25 ENCOUNTER — Ambulatory Visit (INDEPENDENT_AMBULATORY_CARE_PROVIDER_SITE_OTHER): Payer: Medicare Other | Admitting: Nurse Practitioner

## 2018-11-25 ENCOUNTER — Encounter (INDEPENDENT_AMBULATORY_CARE_PROVIDER_SITE_OTHER): Payer: Self-pay | Admitting: Nurse Practitioner

## 2018-11-25 VITALS — BP 135/82 | HR 93 | Resp 16

## 2018-11-25 DIAGNOSIS — I890 Lymphedema, not elsewhere classified: Secondary | ICD-10-CM | POA: Diagnosis not present

## 2018-11-25 NOTE — Progress Notes (Signed)
History of Present Illness  There is no documented history at this time  Assessments & Plan   There are no diagnoses linked to this encounter.    Additional instructions  Subjective:  Patient presents with venous ulcer of the Bilateral lower extremity.    Procedure:  3 layer unna wrap was placed Bilateral lower extremity.   Plan:   Follow up in one week.  

## 2018-12-02 ENCOUNTER — Encounter (INDEPENDENT_AMBULATORY_CARE_PROVIDER_SITE_OTHER): Payer: Self-pay | Admitting: Nurse Practitioner

## 2018-12-02 ENCOUNTER — Ambulatory Visit (INDEPENDENT_AMBULATORY_CARE_PROVIDER_SITE_OTHER): Payer: Medicare Other | Admitting: Nurse Practitioner

## 2018-12-02 ENCOUNTER — Other Ambulatory Visit: Payer: Self-pay

## 2018-12-02 VITALS — BP 152/86 | HR 103 | Resp 21 | Ht 69.0 in | Wt 318.0 lb

## 2018-12-02 DIAGNOSIS — F17200 Nicotine dependence, unspecified, uncomplicated: Secondary | ICD-10-CM | POA: Diagnosis not present

## 2018-12-02 DIAGNOSIS — I890 Lymphedema, not elsewhere classified: Secondary | ICD-10-CM | POA: Diagnosis not present

## 2018-12-02 DIAGNOSIS — L97322 Non-pressure chronic ulcer of left ankle with fat layer exposed: Secondary | ICD-10-CM | POA: Diagnosis not present

## 2018-12-04 ENCOUNTER — Encounter (INDEPENDENT_AMBULATORY_CARE_PROVIDER_SITE_OTHER): Payer: Self-pay | Admitting: Nurse Practitioner

## 2018-12-04 NOTE — Progress Notes (Signed)
SUBJECTIVE:  Patient ID: [name], male    DOB: May 14, 1965, 53 y.o.   MRN: [id number] Chief Complaint  Patient presents with  . Follow-up    HPI  [name] is a 53 y.o. male that presents today for evaluation of lower extremity edema and ulcerations.  The patient's lower extremities look very good today.  There is still a small shallow ulceration behind his left calf.  No evidence of cellulitis today.  The patient does endorse utilizing his lymph pump 3 times daily.  Overall he feels well.  Past Medical History:  Diagnosis Date  . COPD (chronic obstructive pulmonary disease) ([place])   . Coronary artery disease   . Diabetes mellitus without complication ([place])   . Hypertension   . Peptic ulcer   . Sleep apnea     Past Surgical History:  Procedure Laterality Date  . COLONOSCOPY WITH PROPOFOL N/A 11/09/2016   Procedure: COLONOSCOPY WITH PROPOFOL;  Surgeon: Lollie Sails, MD;  Location: [facility] ENDOSCOPY;  Service: Endoscopy;  Laterality: N/A;  . COLONOSCOPY WITH PROPOFOL N/A 07/06/2017   Procedure: COLONOSCOPY WITH PROPOFOL;  Surgeon: Lollie Sails, MD;  Location: [facility] ENDOSCOPY;  Service: Endoscopy;  Laterality: N/A;  . ESOPHAGOGASTRODUODENOSCOPY (EGD) WITH PROPOFOL N/A 11/09/2016   Procedure: ESOPHAGOGASTRODUODENOSCOPY (EGD) WITH PROPOFOL;  Surgeon: Lollie Sails, MD;  Location: [facility] ENDOSCOPY;  Service: Endoscopy;  Laterality: N/A;  . ESOPHAGOGASTRODUODENOSCOPY (EGD) WITH PROPOFOL N/A 07/06/2017   Procedure: ESOPHAGOGASTRODUODENOSCOPY (EGD) WITH PROPOFOL;  Surgeon: Lollie Sails, MD;  Location: [facility] ENDOSCOPY;  Service: Endoscopy;  Laterality: N/A;    Social History   Socioeconomic History  . Marital status: Single    Spouse name: Not on file  . Number of children: Not on file  . Years of education: Not on file  . Highest education level: Not on file  Occupational History  . Not on file  Social Needs  . Financial resource strain: Not on file   . Food insecurity    Worry: Not on file    Inability: Not on file  . Transportation needs    Medical: Not on file    Non-medical: Not on file  Tobacco Use  . Smoking status: Current Every Day Smoker    Packs/day: 1.00    Years: 30.00    Pack years: 30.00    Types: Cigarettes  . Smokeless tobacco: Never Used  . Tobacco comment: trying decrease  Substance and Sexual Activity  . Alcohol use: No  . Drug use: No  . Sexual activity: Never  Lifestyle  . Physical activity    Days per week: Not on file    Minutes per session: Not on file  . Stress: Not on file  Relationships  . Social Herbalist on phone: Not on file    Gets together: Not on file    Attends religious service: Not on file    Active member of club or organization: Not on file    Attends meetings of clubs or organizations: Not on file    Relationship status: Not on file  . Intimate partner violence    Fear of current or ex partner: Not on file    Emotionally abused: Not on file    Physically abused: Not on file    Forced sexual activity: Not on file  Other Topics Concern  . Not on file  Social History Narrative  . Not on file    Family History  Problem Relation Age  of Onset  . Diabetes Mother   . Diabetes Father   . Stomach cancer Father     Allergies  Allergen Reactions  . Shellfish Allergy Swelling  . Codeine Rash  . Lisinopril Cough     Review of Systems   Review of Systems: Negative Unless Checked Constitutional: [] Weight loss  [] Fever  [] Chills Cardiac: [] Chest pain   []  Atrial Fibrillation  [] Palpitations   [] Shortness of breath when laying flat   [] Shortness of breath with exertion. [] Shortness of breath at rest Vascular:  [] Pain in legs with walking   [] Pain in legs with standing [] Pain in legs when laying flat   [] Claudication    [] Pain in feet when laying flat    [] History of DVT   [] Phlebitis   [x] Swelling in legs   [] Varicose veins   [] Non-healing ulcers Pulmonary:   [] Uses  home oxygen   [] Productive cough   [] Hemoptysis   [] Wheeze  [] COPD   [] Asthma Neurologic:  [] Dizziness   [] Seizures  [] Blackouts [] History of stroke   [] History of TIA  [] Aphasia   [] Temporary Blindness   [] Weakness or numbness in arm   [] Weakness or numbness in leg Musculoskeletal:   [] Joint swelling   [] Joint pain   [] Low back pain  []  History of Knee Replacement [x] Arthritis [] back Surgeries  []  Spinal Stenosis    Hematologic:  [] Easy bruising  [] Easy bleeding   [] Hypercoagulable state   [] Anemic Gastrointestinal:  [] Diarrhea   [] Vomiting  [] Gastroesophageal reflux/heartburn   [] Difficulty swallowing. [] Abdominal pain Genitourinary:  [] Chronic kidney disease   [] Difficult urination  [] Anuric   [] Blood in urine [] Frequent urination  [] Burning with urination   [] Hematuria Skin:  [] Rashes   [x] Ulcers [] Wounds Psychological:  [] History of anxiety   []  History of major depression  []  Memory Difficulties      OBJECTIVE:   Physical Exam  BP (!) 152/86 (BP Location: Right Arm)   Pulse (!) 103   Resp (!) 21   Ht 5\' 9"  (1.753 m)   Wt (!) 318 lb (144.2 kg)   BMI 46.96 kg/m   Gen: WD/WN, NAD Head: [place]/AT, No temporalis wasting.  Ear/Nose/Throat: Hearing grossly intact, nares w/o erythema or drainage Eyes: PER, EOMI, sclera nonicteric.  Neck: Supple, no masses.  No JVD.  Pulmonary:  Good air movement, no use of accessory muscles.  Cardiac: RRR Vascular:  Vessel Right Left  Radial Palpable Palpable  Brachial Palpable Palpable  Femoral Palpable Palpable  Popliteal Palpable Palpable  Dorsalis Pedis Palpable Palpable  Posterior Tibial Palpable Palpable   Gastrointestinal: soft, non-distended. No guarding/no peritoneal signs.  Musculoskeletal: M/S 5/5 throughout.  No deformity or atrophy.  Neurologic: Pain and light touch intact in extremities.  Symmetrical.  Speech is fluent. Motor exam as listed above. Psychiatric: Judgment intact, Mood & affect appropriate for pt's clinical situation.  Dermatologic: No Venous rashes. No Ulcers Noted.  No changes consistent with cellulitis. Lymph : No Cervical lymphadenopathy, no lichenification or skin changes of chronic lymphedema.       ASSESSMENT AND PLAN:  1. Lower limb ulcer, ankle, left, with fat layer exposed ([place]) No surgery or intervention at this point in time.    I have had a long discussion with the patient regarding venous insufficiency and why it  causes symptoms, specifically venous ulceration . I have discussed with the patient the chronic skin changes that accompany venous insufficiency and the long term sequela such as infection and recurring  ulceration.  Patient will be placed in [place]  Boots which will be changed weekly drainage permitting.  In addition, behavioral modification including several periods of elevation of the lower extremities during the day will be continued. Achieving a position with the ankles at heart level was stressed to the patient  The patient is instructed to begin routine exercise, especially walking on a daily basis  Patient will follow-up in 4 weeks for evaluation of ulcer as well as lower extremity edema.  Patient's ulceration is healed and the swelling is well controlled we may do a Unna wrap holiday.   2. Tobacco dependence Smoking cessation was discussed, 3-10 minutes spent on this topic specifically   3. Lymphedema Patient will continue with conservative therapy tactics.  He will be in [place] wraps for at least the next 4 weeks.  He will also continue to elevate his lower extremities as much as possible.  He is urged to get at least 30 minutes a day of exercise.  He should also continue use of his lymphedema pump.   Current Outpatient Medications on File Prior to Visit  Medication Sig Dispense Refill  . albuterol (VENTOLIN HFA) 108 (90 Base) MCG/ACT inhaler Inhale 2 puffs into the lungs every 4 (four) hours as needed for wheezing or shortness of breath.     Marland Kitchen  aspirin-acetaminophen-caffeine (EXCEDRIN MIGRAINE) 250-250-65 MG tablet Take by mouth every 6 (six) hours as needed for headache.    Marland Kitchen atorvastatin (LIPITOR) 20 MG tablet Take 20 mg by mouth at bedtime.     Marland Kitchen buPROPion (WELLBUTRIN XL) 300 MG 24 hr tablet Take 300 mg by mouth daily.    Marland Kitchen doxycycline (VIBRAMYCIN) 100 MG capsule Take 1 capsule (100 mg total) by mouth 2 (two) times daily. 28 capsule 0  . Fluticasone-Salmeterol (ADVAIR DISKUS) 100-50 MCG/DOSE AEPB Inhale 1 puff into the lungs 2 (two) times daily.     . furosemide (LASIX) 20 MG tablet Take 1 tablet (20 mg total) by mouth daily. 14 tablet 0  . gabapentin (NEURONTIN) 300 MG capsule     . hydrochlorothiazide (MICROZIDE) 12.5 MG capsule Take 12.5 mg by mouth daily.   0  . loratadine (CLARITIN) 10 MG tablet Take 10 mg by mouth daily.    Marland Kitchen losartan (COZAAR) 50 MG tablet Take 1 tablet (50 mg total) by mouth daily. 30 tablet 0  . Magnesium 500 MG CAPS Take 1 capsule (500 mg total) by mouth at bedtime. 90 capsule 3  . meloxicam (MOBIC) 15 MG tablet Take 1 tablet (15 mg total) by mouth daily. 90 tablet 1  . metFORMIN (GLUCOPHAGE) 1000 MG tablet Take 1,000 mg by mouth 2 (two) times daily with a meal.    . oxybutynin (DITROPAN) 5 MG tablet Take 5 mg by mouth every 12 (twelve) hours.    . pantoprazole (PROTONIX) 40 MG tablet Take 40 mg by mouth 2 (two) times daily.     Marland Kitchen sulfamethoxazole-trimethoprim (BACTRIM) 400-80 MG tablet Take 1 tablet by mouth 2 (two) times daily. 20 tablet 0  . tiZANidine (ZANAFLEX) 4 MG tablet Take 1 tablet (4 mg total) by mouth every 8 (eight) hours as needed for muscle spasms. 90 tablet 5  . traMADol (ULTRAM) 50 MG tablet Take 1 tablet (50 mg total) by mouth every 6 (six) hours as needed for severe pain. Must last 30 days Max: 4/day 120 tablet 5  . calcium carbonate (CALCIUM 600) 600 MG TABS tablet Take 1 tablet (600 mg total) by mouth 2 (two) times daily with a meal for 30 days. [place]  tablet 0   No current  facility-administered medications on file prior to visit.     There are no Patient Instructions on file for this visit. No follow-ups on file.   Kris Hartmann, NP  This note was completed with Sales executive.  Any errors are purely unintentional.

## 2018-12-06 ENCOUNTER — Other Ambulatory Visit: Payer: Self-pay

## 2018-12-06 ENCOUNTER — Ambulatory Visit (INDEPENDENT_AMBULATORY_CARE_PROVIDER_SITE_OTHER): Payer: Medicare Other | Admitting: Nurse Practitioner

## 2018-12-06 ENCOUNTER — Encounter (INDEPENDENT_AMBULATORY_CARE_PROVIDER_SITE_OTHER): Payer: Self-pay

## 2018-12-06 DIAGNOSIS — M7989 Other specified soft tissue disorders: Secondary | ICD-10-CM | POA: Diagnosis not present

## 2018-12-06 DIAGNOSIS — I890 Lymphedema, not elsewhere classified: Secondary | ICD-10-CM | POA: Diagnosis not present

## 2018-12-06 NOTE — Progress Notes (Signed)
History of Present Illness  There is no documented history at this time  Assessments & Plan   There are no diagnoses linked to this encounter.    Additional instructions  Subjective:  Patient presents with venous ulcer of the Bilateral lower extremity.    Procedure:  3 layer unna wrap was placed Bilateral lower extremity.   Plan:   Follow up in one week.  

## 2018-12-12 ENCOUNTER — Encounter (INDEPENDENT_AMBULATORY_CARE_PROVIDER_SITE_OTHER): Payer: Self-pay | Admitting: Nurse Practitioner

## 2018-12-13 ENCOUNTER — Ambulatory Visit (INDEPENDENT_AMBULATORY_CARE_PROVIDER_SITE_OTHER): Payer: Medicare Other | Admitting: Vascular Surgery

## 2018-12-13 ENCOUNTER — Encounter (INDEPENDENT_AMBULATORY_CARE_PROVIDER_SITE_OTHER): Payer: Self-pay

## 2018-12-13 ENCOUNTER — Other Ambulatory Visit: Payer: Self-pay

## 2018-12-13 VITALS — BP 135/79 | HR 99 | Resp 16 | Wt 318.0 lb

## 2018-12-13 DIAGNOSIS — I10 Essential (primary) hypertension: Secondary | ICD-10-CM

## 2018-12-13 DIAGNOSIS — L97221 Non-pressure chronic ulcer of left calf limited to breakdown of skin: Secondary | ICD-10-CM

## 2018-12-13 DIAGNOSIS — M7989 Other specified soft tissue disorders: Secondary | ICD-10-CM

## 2018-12-13 NOTE — Progress Notes (Signed)
History of Present Illness  There is no documented history at this time  Assessments & Plan   There are no diagnoses linked to this encounter.    Additional instructions  Subjective:  Patient presents with venous ulcer of the Bilateral lower extremity.    Procedure:  3 layer unna wrap was placed Bilateral lower extremity.   Plan:   Follow up in one week.  

## 2018-12-20 ENCOUNTER — Other Ambulatory Visit: Payer: Self-pay

## 2018-12-20 ENCOUNTER — Encounter (INDEPENDENT_AMBULATORY_CARE_PROVIDER_SITE_OTHER): Payer: Self-pay | Admitting: Nurse Practitioner

## 2018-12-20 ENCOUNTER — Ambulatory Visit (INDEPENDENT_AMBULATORY_CARE_PROVIDER_SITE_OTHER): Payer: Medicare Other | Admitting: Nurse Practitioner

## 2018-12-20 VITALS — BP 146/66 | HR 111 | Resp 16 | Ht 69.0 in | Wt 329.0 lb

## 2018-12-20 DIAGNOSIS — L97219 Non-pressure chronic ulcer of right calf with unspecified severity: Secondary | ICD-10-CM

## 2018-12-20 DIAGNOSIS — I890 Lymphedema, not elsewhere classified: Secondary | ICD-10-CM

## 2018-12-20 DIAGNOSIS — I83012 Varicose veins of right lower extremity with ulcer of calf: Secondary | ICD-10-CM

## 2018-12-20 DIAGNOSIS — L03116 Cellulitis of left lower limb: Secondary | ICD-10-CM

## 2018-12-20 DIAGNOSIS — I10 Essential (primary) hypertension: Secondary | ICD-10-CM | POA: Diagnosis not present

## 2018-12-25 ENCOUNTER — Encounter (INDEPENDENT_AMBULATORY_CARE_PROVIDER_SITE_OTHER): Payer: Self-pay | Admitting: Nurse Practitioner

## 2018-12-25 NOTE — Progress Notes (Signed)
SUBJECTIVE:  Patient ID: [name], male    DOB: [birth year], 53 y.o.   MRN: [id number] Chief Complaint  Patient presents with  . Follow-up    unna check    HPI  [name] is a 52 y.o. male that presents today for Unna wrap check.  Today the patient's edema looks much better controlled.  However he continues to have a shallow ulceration on his right lower extremity.  This ulceration however is improved from his previous visit.  The patient also does not show evidence of cellulitis which is also an improvement.  He denies any fever, chills, nausea, vomiting or diarrhea.  He denies any chest pain or shortness of breath.  Patient is tolerating Unna wraps well.  Past Medical History:  Diagnosis Date  . COPD (chronic obstructive pulmonary disease) ([place])   . Coronary artery disease   . Diabetes mellitus without complication ([place])   . Hypertension   . Peptic ulcer   . Sleep apnea     Past Surgical History:  Procedure Laterality Date  . COLONOSCOPY WITH PROPOFOL N/A 11/09/2016   Procedure: COLONOSCOPY WITH PROPOFOL;  Surgeon: Lollie Sails, MD;  Location: [facility] ENDOSCOPY;  Service: Endoscopy;  Laterality: N/A;  . COLONOSCOPY WITH PROPOFOL N/A 07/06/2017   Procedure: COLONOSCOPY WITH PROPOFOL;  Surgeon: Lollie Sails, MD;  Location: [facility] ENDOSCOPY;  Service: Endoscopy;  Laterality: N/A;  . ESOPHAGOGASTRODUODENOSCOPY (EGD) WITH PROPOFOL N/A 11/09/2016   Procedure: ESOPHAGOGASTRODUODENOSCOPY (EGD) WITH PROPOFOL;  Surgeon: Lollie Sails, MD;  Location: [facility] ENDOSCOPY;  Service: Endoscopy;  Laterality: N/A;  . ESOPHAGOGASTRODUODENOSCOPY (EGD) WITH PROPOFOL N/A 07/06/2017   Procedure: ESOPHAGOGASTRODUODENOSCOPY (EGD) WITH PROPOFOL;  Surgeon: Lollie Sails, MD;  Location: [facility] ENDOSCOPY;  Service: Endoscopy;  Laterality: N/A;    Social History   Socioeconomic History  . Marital status: Single    Spouse name: Not on file  . Number of children: Not on file  .  Years of education: Not on file  . Highest education level: Not on file  Occupational History  . Not on file  Tobacco Use  . Smoking status: Current Every Day Smoker    Packs/day: 1.00    Years: 30.00    Pack years: 30.00    Types: Cigarettes  . Smokeless tobacco: Never Used  . Tobacco comment: trying decrease  Substance and Sexual Activity  . Alcohol use: No  . Drug use: No  . Sexual activity: Never  Other Topics Concern  . Not on file  Social History Narrative  . Not on file   Social Determinants of Health   Financial Resource Strain:   . Difficulty of Paying Living Expenses: Not on file  Food Insecurity:   . Worried About Charity fundraiser in the Last Year: Not on file  . Ran Out of Food in the Last Year: Not on file  Transportation Needs:   . Lack of Transportation (Medical): Not on file  . Lack of Transportation (Non-Medical): Not on file  Physical Activity:   . Days of Exercise per Week: Not on file  . Minutes of Exercise per Session: Not on file  Stress:   . Feeling of Stress : Not on file  Social Connections:   . Frequency of Communication with Friends and Family: Not on file  . Frequency of Social Gatherings with Friends and Family: Not on file  . Attends Religious Services: Not on file  . Active Member of Clubs or Organizations: Not on file  .  Attends Archivist Meetings: Not on file  . Marital Status: Not on file  Intimate Partner Violence:   . Fear of Current or Ex-Partner: Not on file  . Emotionally Abused: Not on file  . Physically Abused: Not on file  . Sexually Abused: Not on file    Family History  Problem Relation Age of Onset  . Diabetes Mother   . Diabetes Father   . Stomach cancer Father     Allergies  Allergen Reactions  . Shellfish Allergy Swelling  . Codeine Rash  . Lisinopril Cough     Review of Systems   Review of Systems: Negative Unless Checked Constitutional: [] Weight loss  [] Fever  [] Chills Cardiac: [] Chest  pain   []  Atrial Fibrillation  [] Palpitations   [] Shortness of breath when laying flat   [] Shortness of breath with exertion. [] Shortness of breath at rest Vascular:  [] Pain in legs with walking   [] Pain in legs with standing [] Pain in legs when laying flat   [] Claudication    [] Pain in feet when laying flat    [] History of DVT   [] Phlebitis   [x] Swelling in legs   [] Varicose veins   [x] Non-healing ulcers Pulmonary:   [] Uses home oxygen   [] Productive cough   [] Hemoptysis   [] Wheeze  [x] COPD   [] Asthma Neurologic:  [] Dizziness   [] Seizures  [] Blackouts [] History of stroke   [] History of TIA  [] Aphasia   [] Temporary Blindness   [] Weakness or numbness in arm   [] Weakness or numbness in leg Musculoskeletal:   [] Joint swelling   [] Joint pain   [] Low back pain  []  History of Knee Replacement [x] Arthritis [] back Surgeries  []  Spinal Stenosis    Hematologic:  [] Easy bruising  [] Easy bleeding   [] Hypercoagulable state   [] Anemic Gastrointestinal:  [] Diarrhea   [] Vomiting  [] Gastroesophageal reflux/heartburn   [] Difficulty swallowing. [] Abdominal pain Genitourinary:  [] Chronic kidney disease   [] Difficult urination  [] Anuric   [] Blood in urine [] Frequent urination  [] Burning with urination   [] Hematuria Skin:  [] Rashes   [x] Ulcers [] Wounds Psychological:  [] History of anxiety   []  History of major depression  []  Memory Difficulties      OBJECTIVE:   Physical Exam  BP (!) 146/66 (BP Location: Right Arm)   Pulse (!) 111   Resp 16   Ht 5\' 9"  (1.753 m)   Wt (!) 329 lb (149.2 kg)   BMI 48.58 kg/m   Gen: WD/WN, NAD Head: [place]/AT, No temporalis wasting.  Ear/Nose/Throat: Hearing grossly intact, nares w/o erythema or drainage Eyes: PER, EOMI, sclera nonicteric.  Neck: Supple, no masses.  No JVD.  Pulmonary:  Good air movement, no use of accessory muscles.  Cardiac: RRR Vascular:  2+ edema bilaterally with shallow ulceration on right posterior calf Vessel Right Left  Radial Palpable Palpable    Gastrointestinal: soft, non-distended. No guarding/no peritoneal signs.  Musculoskeletal: M/S 5/5 throughout.  No deformity or atrophy.  Neurologic: Pain and light touch intact in extremities.  Symmetrical.  Speech is fluent. Motor exam as listed above. Psychiatric: Judgment intact, Mood & affect appropriate for pt's clinical situation. Dermatologic:  Bilateral stasis dermatitis no Ulcers Noted.  No changes consistent with cellulitis. Lymph : No Cervical lymphadenopathy, dermal thickening and lichenification bilaterally      ASSESSMENT AND PLAN:  1. Lymphedema Behavioral modification throughout the day will be continued.  This will include frequent elevation (such as in a recliner), use of over the counter pain medications as needed and exercise such as  walking.  I have reviewed systemic causes for chronic edema such as liver, kidney and cardiac etiologies and there does not appear to be any significant changes in these organ systems over the past year.  The patient is under the impression that these organ systems are all stable and unchanged.    The patient will continue aggressive use of the  lymph pump.  This will continue to improve the edema control and prevent sequela such as ulcers and infections.      2. Essential hypertension, benign Continue antihypertensive medications as already ordered, these medications have been reviewed and there are no changes at this time.   3. Cellulitis of left lower extremity Currently resolved with no recurrence.  Referral was previously placed to infectious disease for recurrent cellulitis.  4. Venous stasis ulcer of right calf, unspecified ulcer stage, unspecified whether varicose veins present ([place]) Improved however not quite healed.  We will keep patient in wraps   Current Outpatient Medications on File Prior to Visit  Medication Sig Dispense Refill  . albuterol (VENTOLIN HFA) 108 (90 Base) MCG/ACT inhaler Inhale 2 puffs into the lungs  every 4 (four) hours as needed for wheezing or shortness of breath.     Marland Kitchen aspirin-acetaminophen-caffeine (EXCEDRIN MIGRAINE) 250-250-65 MG tablet Take by mouth every 6 (six) hours as needed for headache.    Marland Kitchen atorvastatin (LIPITOR) 20 MG tablet Take 20 mg by mouth at bedtime.     Marland Kitchen buPROPion (WELLBUTRIN XL) 300 MG 24 hr tablet Take 300 mg by mouth daily.    . Fluticasone-Salmeterol (ADVAIR DISKUS) 100-50 MCG/DOSE AEPB Inhale 1 puff into the lungs 2 (two) times daily.     . furosemide (LASIX) 20 MG tablet Take 1 tablet (20 mg total) by mouth daily. 14 tablet 0  . gabapentin (NEURONTIN) 300 MG capsule     . hydrochlorothiazide (MICROZIDE) 12.5 MG capsule Take 12.5 mg by mouth daily.   0  . loratadine (CLARITIN) 10 MG tablet Take 10 mg by mouth daily.    Marland Kitchen losartan (COZAAR) 50 MG tablet Take 1 tablet (50 mg total) by mouth daily. 30 tablet 0  . Magnesium 500 MG CAPS Take 1 capsule (500 mg total) by mouth at bedtime. 90 capsule 3  . meloxicam (MOBIC) 15 MG tablet Take 1 tablet (15 mg total) by mouth daily. 90 tablet 1  . metFORMIN (GLUCOPHAGE) 1000 MG tablet Take 1,000 mg by mouth 2 (two) times daily with a meal.    . nortriptyline (PAMELOR) 25 MG capsule Take 25 mg by mouth at bedtime.    Marland Kitchen oxybutynin (DITROPAN) 5 MG tablet Take 5 mg by mouth every 12 (twelve) hours.    . pantoprazole (PROTONIX) 40 MG tablet Take 40 mg by mouth 2 (two) times daily.     Marland Kitchen tiZANidine (ZANAFLEX) 4 MG tablet Take 1 tablet (4 mg total) by mouth every 8 (eight) hours as needed for muscle spasms. 90 tablet 5  . traMADol (ULTRAM) 50 MG tablet Take 1 tablet (50 mg total) by mouth every 6 (six) hours as needed for severe pain. Must last 30 days Max: 4/day 120 tablet 5  . calcium carbonate (CALCIUM 600) 600 MG TABS tablet Take 1 tablet (600 mg total) by mouth 2 (two) times daily with a meal for 30 days. 60 tablet 0  . doxycycline (VIBRAMYCIN) 100 MG capsule Take 1 capsule (100 mg total) by mouth 2 (two) times daily. (Patient  not taking: Reported on 12/20/2018) 28 capsule 0  .  sulfamethoxazole-trimethoprim (BACTRIM) 400-80 MG tablet Take 1 tablet by mouth 2 (two) times daily. 20 tablet 0   No current facility-administered medications on file prior to visit.    There are no Patient Instructions on file for this visit. No follow-ups on file.   Kris Hartmann, NP  This note was completed with Sales executive.  Any errors are purely unintentional.

## 2018-12-27 ENCOUNTER — Ambulatory Visit (INDEPENDENT_AMBULATORY_CARE_PROVIDER_SITE_OTHER): Payer: Medicare Other | Admitting: Nurse Practitioner

## 2018-12-27 ENCOUNTER — Encounter (INDEPENDENT_AMBULATORY_CARE_PROVIDER_SITE_OTHER): Payer: Self-pay | Admitting: Nurse Practitioner

## 2018-12-27 ENCOUNTER — Other Ambulatory Visit: Payer: Self-pay

## 2018-12-27 VITALS — BP 158/90 | HR 112 | Resp 18 | Ht 69.0 in | Wt 328.0 lb

## 2018-12-27 DIAGNOSIS — L97221 Non-pressure chronic ulcer of left calf limited to breakdown of skin: Secondary | ICD-10-CM

## 2018-12-27 NOTE — Progress Notes (Signed)
History of Present Illness  There is no documented history at this time  Assessments & Plan   There are no diagnoses linked to this encounter.    Additional instructions  Subjective:  Patient presents with venous ulcer of the Bilateral lower extremity.    Procedure:  3 layer unna wrap was placed Bilateral lower extremity.   Plan:   Follow up in one week.  

## 2019-01-03 ENCOUNTER — Ambulatory Visit (INDEPENDENT_AMBULATORY_CARE_PROVIDER_SITE_OTHER): Payer: Medicare Other | Admitting: Nurse Practitioner

## 2019-01-03 ENCOUNTER — Other Ambulatory Visit: Payer: Self-pay

## 2019-01-03 ENCOUNTER — Encounter (INDEPENDENT_AMBULATORY_CARE_PROVIDER_SITE_OTHER): Payer: Self-pay

## 2019-01-03 DIAGNOSIS — I890 Lymphedema, not elsewhere classified: Secondary | ICD-10-CM

## 2019-01-03 DIAGNOSIS — R600 Localized edema: Secondary | ICD-10-CM | POA: Diagnosis not present

## 2019-01-03 DIAGNOSIS — M7989 Other specified soft tissue disorders: Secondary | ICD-10-CM

## 2019-01-03 NOTE — Progress Notes (Signed)
History of Present Illness  There is no documented history at this time  Assessments & Plan   There are no diagnoses linked to this encounter.    Additional instructions  Subjective:  Patient presents with venous ulcer of the Bilateral lower extremity.    Procedure:  3 layer unna wrap was placed Bilateral lower extremity.   Plan:   Follow up in one week.  

## 2019-01-08 ENCOUNTER — Encounter (INDEPENDENT_AMBULATORY_CARE_PROVIDER_SITE_OTHER): Payer: Self-pay | Admitting: Nurse Practitioner

## 2019-01-10 ENCOUNTER — Ambulatory Visit (INDEPENDENT_AMBULATORY_CARE_PROVIDER_SITE_OTHER): Payer: Medicare Other | Admitting: Nurse Practitioner

## 2019-01-10 ENCOUNTER — Encounter (INDEPENDENT_AMBULATORY_CARE_PROVIDER_SITE_OTHER): Payer: Self-pay | Admitting: Nurse Practitioner

## 2019-01-10 ENCOUNTER — Other Ambulatory Visit: Payer: Self-pay

## 2019-01-10 VITALS — BP 156/98 | HR 108 | Resp 21 | Wt 316.0 lb

## 2019-01-10 DIAGNOSIS — I10 Essential (primary) hypertension: Secondary | ICD-10-CM

## 2019-01-10 DIAGNOSIS — E119 Type 2 diabetes mellitus without complications: Secondary | ICD-10-CM | POA: Diagnosis not present

## 2019-01-10 DIAGNOSIS — I890 Lymphedema, not elsewhere classified: Secondary | ICD-10-CM | POA: Diagnosis not present

## 2019-01-15 ENCOUNTER — Encounter (INDEPENDENT_AMBULATORY_CARE_PROVIDER_SITE_OTHER): Payer: Self-pay | Admitting: Nurse Practitioner

## 2019-01-15 NOTE — Progress Notes (Signed)
SUBJECTIVE:  Patient ID: [name], male    DOB: 1965-11-22, 54 y.o.   MRN: [id number] Chief Complaint  Patient presents with  . Follow-up    BLE swelling    HPI  [name] is a 54 y.o. male that presents today for evaluation of his bilateral lower extremity edema.  The patient has a history of recurrent cellulitis and lower extremity ulceration.  Today, the patient's lower extremity wounds and edema look remarkably well.  This looks much better than it has in some time.  He denies any fever, chills, nausea vomiting or diarrhea.  The patient endorses continued use of his lymphedema pump as well as elevation of his lower extremities.  Past Medical History:  Diagnosis Date  . COPD (chronic obstructive pulmonary disease) ([place])   . Coronary artery disease   . Diabetes mellitus without complication ([place])   . Hypertension   . Peptic ulcer   . Sleep apnea     Past Surgical History:  Procedure Laterality Date  . COLONOSCOPY WITH PROPOFOL N/A 11/09/2016   Procedure: COLONOSCOPY WITH PROPOFOL;  Surgeon: Lollie Sails, MD;  Location: [facility] ENDOSCOPY;  Service: Endoscopy;  Laterality: N/A;  . COLONOSCOPY WITH PROPOFOL N/A 07/06/2017   Procedure: COLONOSCOPY WITH PROPOFOL;  Surgeon: Lollie Sails, MD;  Location: [facility] ENDOSCOPY;  Service: Endoscopy;  Laterality: N/A;  . ESOPHAGOGASTRODUODENOSCOPY (EGD) WITH PROPOFOL N/A 11/09/2016   Procedure: ESOPHAGOGASTRODUODENOSCOPY (EGD) WITH PROPOFOL;  Surgeon: Lollie Sails, MD;  Location: [facility] ENDOSCOPY;  Service: Endoscopy;  Laterality: N/A;  . ESOPHAGOGASTRODUODENOSCOPY (EGD) WITH PROPOFOL N/A 07/06/2017   Procedure: ESOPHAGOGASTRODUODENOSCOPY (EGD) WITH PROPOFOL;  Surgeon: Lollie Sails, MD;  Location: [facility] ENDOSCOPY;  Service: Endoscopy;  Laterality: N/A;    Social History   Socioeconomic History  . Marital status: Single    Spouse name: Not on file  . Number of children: Not on file  . Years of education: Not  on file  . Highest education level: Not on file  Occupational History  . Not on file  Tobacco Use  . Smoking status: Current Every Day Smoker    Packs/day: 1.00    Years: 30.00    Pack years: 30.00    Types: Cigarettes  . Smokeless tobacco: Never Used  . Tobacco comment: trying decrease  Substance and Sexual Activity  . Alcohol use: No  . Drug use: No  . Sexual activity: Never  Other Topics Concern  . Not on file  Social History Narrative  . Not on file   Social Determinants of Health   Financial Resource Strain:   . Difficulty of Paying Living Expenses: Not on file  Food Insecurity:   . Worried About Charity fundraiser in the Last Year: Not on file  . Ran Out of Food in the Last Year: Not on file  Transportation Needs:   . Lack of Transportation (Medical): Not on file  . Lack of Transportation (Non-Medical): Not on file  Physical Activity:   . Days of Exercise per Week: Not on file  . Minutes of Exercise per Session: Not on file  Stress:   . Feeling of Stress : Not on file  Social Connections:   . Frequency of Communication with Friends and Family: Not on file  . Frequency of Social Gatherings with Friends and Family: Not on file  . Attends Religious Services: Not on file  . Active Member of Clubs or Organizations: Not on file  . Attends Archivist Meetings:  Not on file  . Marital Status: Not on file  Intimate Partner Violence:   . Fear of Current or Ex-Partner: Not on file  . Emotionally Abused: Not on file  . Physically Abused: Not on file  . Sexually Abused: Not on file    Family History  Problem Relation Age of Onset  . Diabetes Mother   . Diabetes Father   . Stomach cancer Father     Allergies  Allergen Reactions  . Shellfish Allergy Swelling  . Codeine Rash  . Lisinopril Cough     Review of Systems   Review of Systems: Negative Unless Checked Constitutional: [] Weight loss  [] Fever  [] Chills Cardiac: [] Chest pain   []  Atrial  Fibrillation  [] Palpitations   [] Shortness of breath when laying flat   [] Shortness of breath with exertion. [] Shortness of breath at rest Vascular:  [] Pain in legs with walking   [] Pain in legs with standing [] Pain in legs when laying flat   [] Claudication    [] Pain in feet when laying flat    [] History of DVT   [] Phlebitis   [x] Swelling in legs   [] Varicose veins   [] Non-healing ulcers Pulmonary:   [] Uses home oxygen   [] Productive cough   [] Hemoptysis   [] Wheeze  [] COPD   [] Asthma Neurologic:  [] Dizziness   [] Seizures  [] Blackouts [] History of stroke   [] History of TIA  [] Aphasia   [] Temporary Blindness   [] Weakness or numbness in arm   [] Weakness or numbness in leg Musculoskeletal:   [] Joint swelling   [] Joint pain   [] Low back pain  []  History of Knee Replacement [x] Arthritis [] back Surgeries  [x]  Spinal Stenosis    Hematologic:  [] Easy bruising  [] Easy bleeding   [] Hypercoagulable state   [] Anemic Gastrointestinal:  [] Diarrhea   [] Vomiting  [] Gastroesophageal reflux/heartburn   [] Difficulty swallowing. [] Abdominal pain Genitourinary:  [] Chronic kidney disease   [] Difficult urination  [] Anuric   [] Blood in urine [] Frequent urination  [] Burning with urination   [] Hematuria Skin:  [] Rashes   [] Ulcers [] Wounds Psychological:  [] History of anxiety   []  History of major depression  []  Memory Difficulties      OBJECTIVE:   Physical Exam  BP (!) 156/98 (BP Location: Right Arm)   Pulse (!) 108   Resp (!) 21   Wt (!) 316 lb (143.3 kg)   BMI 46.67 kg/m   Gen: WD/WN, NAD Head: [place]/AT, No temporalis wasting.  Ear/Nose/Throat: Hearing grossly intact, nares w/o erythema or drainage Eyes: PER, EOMI, sclera nonicteric.  Neck: Supple, no masses.  No JVD.  Pulmonary:  Good air movement, no use of accessory muscles.  Cardiac: RRR Vascular:  1+ edema bilaterally, no ulcerations.  Mild stasis dermatitis bilaterally Vessel Right Left  Radial Palpable Palpable  Gastrointestinal: soft, non-distended. No  guarding/no peritoneal signs.  Musculoskeletal: M/S 5/5 throughout.  No deformity or atrophy.  Neurologic: Pain and light touch intact in extremities.  Symmetrical.  Speech is fluent. Motor exam as listed above. Psychiatric: Judgment intact, Mood & affect appropriate for pt's clinical situation. Dermatologic: No Venous rashes. No Ulcers Noted.  No changes consistent with cellulitis. Lymph : No Cervical lymphadenopathy, dermal thickening bilaterally       ASSESSMENT AND PLAN:  1. Lymphedema Patient has great control over edema today.  Based on this we will allow the patient to have a wrap vacation.  This is to allow his skin time to breathe and not have caused irritation.  Patient is reminded to utilize medical grade 1 compression  stockings on a daily basis.  Patient will be placed first thing in the morning and remove before bed.  Elevation should be continued as well as lymphedema pump use should be continued as well.  Patient will follow up to the office in 2 weeks.  2. Type 2 diabetes mellitus without complication, without long-term current use of insulin (HCC) Good blood glucose control is important for controlling cellulitis and other cutaneous infections.  Patient currently on appropriate medications, will continue to be followed by PCP.  3. Essential hypertension, benign Blood pressure control well today.  Patient should continue with current medications will continue managed by PCP.   Current Outpatient Medications on File Prior to Visit  Medication Sig Dispense Refill  . albuterol (VENTOLIN HFA) 108 (90 Base) MCG/ACT inhaler Inhale 2 puffs into the lungs every 4 (four) hours as needed for wheezing or shortness of breath.     Marland Kitchen aspirin-acetaminophen-caffeine (EXCEDRIN MIGRAINE) 250-250-65 MG tablet Take by mouth every 6 (six) hours as needed for headache.    Marland Kitchen atorvastatin (LIPITOR) 20 MG tablet Take 20 mg by mouth at bedtime.     Marland Kitchen buPROPion (WELLBUTRIN XL) 300 MG 24 hr tablet Take  300 mg by mouth daily.    . calcium carbonate (CALCIUM 600) 600 MG TABS tablet Take 1 tablet (600 mg total) by mouth 2 (two) times daily with a meal for 30 days. 60 tablet 0  . Fluticasone-Salmeterol (ADVAIR DISKUS) 100-50 MCG/DOSE AEPB Inhale 1 puff into the lungs 2 (two) times daily.     . furosemide (LASIX) 20 MG tablet Take 1 tablet (20 mg total) by mouth daily. 14 tablet 0  . gabapentin (NEURONTIN) 300 MG capsule     . hydrochlorothiazide (MICROZIDE) 12.5 MG capsule Take 12.5 mg by mouth daily.   0  . loratadine (CLARITIN) 10 MG tablet Take 10 mg by mouth daily.    Marland Kitchen losartan (COZAAR) 50 MG tablet Take 1 tablet (50 mg total) by mouth daily. 30 tablet 0  . Magnesium 500 MG CAPS Take 1 capsule (500 mg total) by mouth at bedtime. 90 capsule 3  . meloxicam (MOBIC) 15 MG tablet Take 1 tablet (15 mg total) by mouth daily. 90 tablet 1  . metFORMIN (GLUCOPHAGE) 1000 MG tablet Take 1,000 mg by mouth 2 (two) times daily with a meal.    . nortriptyline (PAMELOR) 25 MG capsule Take 25 mg by mouth at bedtime.    Marland Kitchen oxybutynin (DITROPAN) 5 MG tablet Take 5 mg by mouth every 12 (twelve) hours.    . pantoprazole (PROTONIX) 40 MG tablet Take 40 mg by mouth 2 (two) times daily.     Marland Kitchen tiZANidine (ZANAFLEX) 4 MG tablet Take 1 tablet (4 mg total) by mouth every 8 (eight) hours as needed for muscle spasms. 90 tablet 5  . traMADol (ULTRAM) 50 MG tablet Take 1 tablet (50 mg total) by mouth every 6 (six) hours as needed for severe pain. Must last 30 days Max: 4/day 120 tablet 5  . TRUEPLUS 5-BEVEL PEN NEEDLES 31G X 6 MM MISC     . VICTOZA 18 MG/3ML SOPN     . doxycycline (VIBRAMYCIN) 100 MG capsule Take 1 capsule (100 mg total) by mouth 2 (two) times daily. (Patient not taking: Reported on 01/10/2019) 28 capsule 0  . sulfamethoxazole-trimethoprim (BACTRIM) 400-80 MG tablet Take 1 tablet by mouth 2 (two) times daily. (Patient not taking: Reported on 01/10/2019) 20 tablet 0   No current facility-administered  medications on file  prior to visit.    There are no Patient Instructions on file for this visit. No follow-ups on file.   Kris Hartmann, NP  This note was completed with Sales executive.  Any errors are purely unintentional.

## 2019-01-17 ENCOUNTER — Ambulatory Visit (INDEPENDENT_AMBULATORY_CARE_PROVIDER_SITE_OTHER): Payer: Medicare Other | Admitting: Nurse Practitioner

## 2019-01-24 ENCOUNTER — Other Ambulatory Visit: Payer: Self-pay

## 2019-01-24 ENCOUNTER — Ambulatory Visit (INDEPENDENT_AMBULATORY_CARE_PROVIDER_SITE_OTHER): Payer: Medicare Other | Admitting: Nurse Practitioner

## 2019-01-24 ENCOUNTER — Encounter (INDEPENDENT_AMBULATORY_CARE_PROVIDER_SITE_OTHER): Payer: Self-pay | Admitting: Nurse Practitioner

## 2019-01-24 VITALS — BP 118/68 | HR 108 | Resp 16 | Wt 314.8 lb

## 2019-01-24 DIAGNOSIS — I890 Lymphedema, not elsewhere classified: Secondary | ICD-10-CM | POA: Diagnosis not present

## 2019-01-24 DIAGNOSIS — I10 Essential (primary) hypertension: Secondary | ICD-10-CM

## 2019-01-24 NOTE — Progress Notes (Signed)
SUBJECTIVE:  Patient ID: [name], male    DOB: Jun 03, 1965, 54 y.o.   MRN: [id number] Chief Complaint  Patient presents with  . Follow-up    2weeks follow up    HPI  [name] is a 54 y.o. male that presents today for evaluation of his lymphedema.  The patient had been in [place] wraps for an extended period of time and finally reached a point where his legs had no evidence of cellulitis and the swelling was well controlled.  We made the decision to do a Unna wrap holiday.  Today his legs look remarkably well.  The patient states that he has been compliant with utilizing his lymphedema pump 3 times daily for an hour, wearing his compression socks and elevating his lower extremities when possible.  He also stays active by doing things around the house.  The patient has been doing very well with making sure that his legs are moisturized to minimize any risk for recurrent cellulitis.  He denies any pain, weeping or any other symptoms that he has previously experienced with cellulitis.  Past Medical History:  Diagnosis Date  . COPD (chronic obstructive pulmonary disease) ([place])   . Coronary artery disease   . Diabetes mellitus without complication ([place])   . Hypertension   . Peptic ulcer   . Sleep apnea     Past Surgical History:  Procedure Laterality Date  . COLONOSCOPY WITH PROPOFOL N/A 11/09/2016   Procedure: COLONOSCOPY WITH PROPOFOL;  Surgeon: Lollie Sails, MD;  Location: [facility] ENDOSCOPY;  Service: Endoscopy;  Laterality: N/A;  . COLONOSCOPY WITH PROPOFOL N/A 07/06/2017   Procedure: COLONOSCOPY WITH PROPOFOL;  Surgeon: Lollie Sails, MD;  Location: [facility] ENDOSCOPY;  Service: Endoscopy;  Laterality: N/A;  . ESOPHAGOGASTRODUODENOSCOPY (EGD) WITH PROPOFOL N/A 11/09/2016   Procedure: ESOPHAGOGASTRODUODENOSCOPY (EGD) WITH PROPOFOL;  Surgeon: Lollie Sails, MD;  Location: [facility] ENDOSCOPY;  Service: Endoscopy;  Laterality: N/A;  . ESOPHAGOGASTRODUODENOSCOPY (EGD) WITH  PROPOFOL N/A 07/06/2017   Procedure: ESOPHAGOGASTRODUODENOSCOPY (EGD) WITH PROPOFOL;  Surgeon: Lollie Sails, MD;  Location: [facility] ENDOSCOPY;  Service: Endoscopy;  Laterality: N/A;    Social History   Socioeconomic History  . Marital status: Single    Spouse name: Not on file  . Number of children: Not on file  . Years of education: Not on file  . Highest education level: Not on file  Occupational History  . Not on file  Tobacco Use  . Smoking status: Current Every Day Smoker    Packs/day: 1.00    Years: 30.00    Pack years: 30.00    Types: Cigarettes  . Smokeless tobacco: Never Used  . Tobacco comment: trying decrease  Substance and Sexual Activity  . Alcohol use: No  . Drug use: No  . Sexual activity: Never  Other Topics Concern  . Not on file  Social History Narrative  . Not on file   Social Determinants of Health   Financial Resource Strain:   . Difficulty of Paying Living Expenses: Not on file  Food Insecurity:   . Worried About Charity fundraiser in the Last Year: Not on file  . Ran Out of Food in the Last Year: Not on file  Transportation Needs:   . Lack of Transportation (Medical): Not on file  . Lack of Transportation (Non-Medical): Not on file  Physical Activity:   . Days of Exercise per Week: Not on file  . Minutes of Exercise per Session: Not on file  Stress:   . Feeling of Stress : Not on file  Social Connections:   . Frequency of Communication with Friends and Family: Not on file  . Frequency of Social Gatherings with Friends and Family: Not on file  . Attends Religious Services: Not on file  . Active Member of Clubs or Organizations: Not on file  . Attends Archivist Meetings: Not on file  . Marital Status: Not on file  Intimate Partner Violence:   . Fear of Current or Ex-Partner: Not on file  . Emotionally Abused: Not on file  . Physically Abused: Not on file  . Sexually Abused: Not on file    Family History  Problem Relation  Age of Onset  . Diabetes Mother   . Diabetes Father   . Stomach cancer Father     Allergies  Allergen Reactions  . Shellfish Allergy Swelling  . Codeine Rash  . Lisinopril Cough     Review of Systems   Review of Systems: Negative Unless Checked Constitutional: [] Weight loss  [] Fever  [] Chills Cardiac: [] Chest pain   []  Atrial Fibrillation  [] Palpitations   [] Shortness of breath when laying flat   [] Shortness of breath with exertion. [] Shortness of breath at rest Vascular:  [] Pain in legs with walking   [] Pain in legs with standing [] Pain in legs when laying flat   [] Claudication    [] Pain in feet when laying flat    [] History of DVT   [] Phlebitis   [x] Swelling in legs   [] Varicose veins   [] Non-healing ulcers Pulmonary:   [] Uses home oxygen   [] Productive cough   [] Hemoptysis   [] Wheeze  [] COPD   [] Asthma Neurologic:  [] Dizziness   [] Seizures  [] Blackouts [] History of stroke   [] History of TIA  [] Aphasia   [] Temporary Blindness   [] Weakness or numbness in arm   [] Weakness or numbness in leg Musculoskeletal:   [] Joint swelling   [] Joint pain   [] Low back pain  []  History of Knee Replacement [x] Arthritis [] back Surgeries  []  Spinal Stenosis    Hematologic:  [] Easy bruising  [] Easy bleeding   [] Hypercoagulable state   [] Anemic Gastrointestinal:  [] Diarrhea   [] Vomiting  [] Gastroesophageal reflux/heartburn   [] Difficulty swallowing. [] Abdominal pain Genitourinary:  [] Chronic kidney disease   [] Difficult urination  [] Anuric   [] Blood in urine [] Frequent urination  [] Burning with urination   [] Hematuria Skin:  [] Rashes   [] Ulcers [] Wounds Psychological:  [] History of anxiety   []  History of major depression  []  Memory Difficulties      OBJECTIVE:   Physical Exam  BP 118/68 (BP Location: Right Arm)   Pulse (!) 108   Resp 16   Wt (!) 314 lb 12.8 oz (142.8 kg)   BMI 46.49 kg/m   Gen: WD/WN, NAD Head: [place]/AT, No temporalis wasting.  Ear/Nose/Throat: Hearing grossly intact, nares w/o  erythema or drainage Eyes: PER, EOMI, sclera nonicteric.  Neck: Supple, no masses.  No JVD.  Pulmonary:  Good air movement, no use of accessory muscles.  Cardiac: RRR Gastrointestinal: soft, non-distended. No guarding/no peritoneal signs.  Musculoskeletal: M/S 5/5 throughout.  No deformity or atrophy.  Neurologic: Pain and light touch intact in extremities.  Symmetrical.  Speech is fluent. Motor exam as listed above. Psychiatric: Judgment intact, Mood & affect appropriate for pt's clinical situation. Dermatologic: Bilateral stasis dermatitis no Ulcers Noted.  No changes consistent with cellulitis. Lymph : No Cervical lymphadenopathy, dermal thickening bilaterally      ASSESSMENT AND PLAN:  1. Lymphedema The  patient is continuing to do well without Unna wraps.  He has a good conservative therapy routine going.  At this time we will leave the patient out of wraps and have him continue with conservative therapy.  Due to the patient's multiple infections and history of swelling rapidly went out of wraps we will have close follow-up of 4 weeks.  If at the next follow-up everything looks good we will lengthen his follow-up time as well.  Patient is advised that if he should experience any worsening of swelling, weeping new wounds or infection he should contact our office for evaluation.  2. Essential hypertension, benign Good blood pressure control today.  Blood pressure control is important to controlling several other issues that could worsen patient's lower extremity edema.  On appropriate medications.  No changes at this time.   Current Outpatient Medications on File Prior to Visit  Medication Sig Dispense Refill  . albuterol (VENTOLIN HFA) 108 (90 Base) MCG/ACT inhaler Inhale 2 puffs into the lungs every 4 (four) hours as needed for wheezing or shortness of breath.     Marland Kitchen aspirin-acetaminophen-caffeine (EXCEDRIN MIGRAINE) 250-250-65 MG tablet Take by mouth every 6 (six) hours as needed for  headache.    Marland Kitchen atorvastatin (LIPITOR) 20 MG tablet Take 20 mg by mouth at bedtime.     Marland Kitchen buPROPion (WELLBUTRIN XL) 300 MG 24 hr tablet Take 300 mg by mouth daily.    . Fluticasone-Salmeterol (ADVAIR DISKUS) 100-50 MCG/DOSE AEPB Inhale 1 puff into the lungs 2 (two) times daily.     . furosemide (LASIX) 20 MG tablet Take 1 tablet (20 mg total) by mouth daily. 14 tablet 0  . gabapentin (NEURONTIN) 300 MG capsule     . hydrochlorothiazide (MICROZIDE) 12.5 MG capsule Take 12.5 mg by mouth daily.   0  . loratadine (CLARITIN) 10 MG tablet Take 10 mg by mouth daily.    Marland Kitchen losartan (COZAAR) 50 MG tablet Take 1 tablet (50 mg total) by mouth daily. 30 tablet 0  . Magnesium 500 MG CAPS Take 1 capsule (500 mg total) by mouth at bedtime. 90 capsule 3  . meloxicam (MOBIC) 15 MG tablet Take 1 tablet (15 mg total) by mouth daily. 90 tablet 1  . metFORMIN (GLUCOPHAGE) 1000 MG tablet Take 1,000 mg by mouth 2 (two) times daily with a meal.    . nortriptyline (PAMELOR) 25 MG capsule Take 25 mg by mouth at bedtime.    Marland Kitchen oxybutynin (DITROPAN) 5 MG tablet Take 5 mg by mouth every 12 (twelve) hours.    . pantoprazole (PROTONIX) 40 MG tablet Take 40 mg by mouth 2 (two) times daily.     Marland Kitchen sulfamethoxazole-trimethoprim (BACTRIM) 400-80 MG tablet Take 1 tablet by mouth 2 (two) times daily. 20 tablet 0  . tiZANidine (ZANAFLEX) 4 MG tablet Take 1 tablet (4 mg total) by mouth every 8 (eight) hours as needed for muscle spasms. 90 tablet 5  . traMADol (ULTRAM) 50 MG tablet Take 1 tablet (50 mg total) by mouth every 6 (six) hours as needed for severe pain. Must last 30 days Max: 4/day 120 tablet 5  . TRUEPLUS 5-BEVEL PEN NEEDLES 31G X 6 MM MISC     . VICTOZA 18 MG/3ML SOPN     . calcium carbonate (CALCIUM 600) 600 MG TABS tablet Take 1 tablet (600 mg total) by mouth 2 (two) times daily with a meal for 30 days. 60 tablet 0  . doxycycline (VIBRAMYCIN) 100 MG capsule Take 1 capsule (100  mg total) by mouth 2 (two) times daily.  (Patient not taking: Reported on 01/10/2019) 28 capsule 0   No current facility-administered medications on file prior to visit.    There are no Patient Instructions on file for this visit. No follow-ups on file.   Kris Hartmann, NP  This note was completed with Sales executive.  Any errors are purely unintentional.

## 2019-02-17 ENCOUNTER — Other Ambulatory Visit: Payer: Self-pay | Admitting: Pain Medicine

## 2019-02-17 DIAGNOSIS — G8929 Other chronic pain: Secondary | ICD-10-CM

## 2019-02-20 ENCOUNTER — Other Ambulatory Visit: Payer: Self-pay

## 2019-02-20 ENCOUNTER — Ambulatory Visit (INDEPENDENT_AMBULATORY_CARE_PROVIDER_SITE_OTHER): Payer: Medicare Other | Admitting: Cardiology

## 2019-02-20 ENCOUNTER — Encounter: Payer: Self-pay | Admitting: Cardiology

## 2019-02-20 VITALS — BP 114/75 | HR 91 | Ht 69.0 in | Wt 313.2 lb

## 2019-02-20 DIAGNOSIS — I10 Essential (primary) hypertension: Secondary | ICD-10-CM

## 2019-02-20 DIAGNOSIS — F17200 Nicotine dependence, unspecified, uncomplicated: Secondary | ICD-10-CM | POA: Diagnosis not present

## 2019-02-20 DIAGNOSIS — E7800 Pure hypercholesterolemia, unspecified: Secondary | ICD-10-CM | POA: Diagnosis not present

## 2019-02-20 DIAGNOSIS — R0600 Dyspnea, unspecified: Secondary | ICD-10-CM

## 2019-02-20 DIAGNOSIS — R0609 Other forms of dyspnea: Secondary | ICD-10-CM

## 2019-02-20 MED ORDER — METOPROLOL TARTRATE 100 MG PO TABS: ORAL_TABLET | ORAL | 0 refills | Status: DC

## 2019-02-20 NOTE — Patient Instructions (Signed)
Medication Instructions:  Your physician recommends that you continue on your current medications as directed. Please refer to the Current Medication list given to you today.  A one time dose of Metoprolol 100 mg has been sent to your pharmacy. Take 2 hours prior to your Cardiac CTA  *If you need a refill on your cardiac medications before your next appointment, please call your pharmacy*  Lab Work: You will need labwork prior to your CT. Please have your lab drawn at the [facility] medical mall. You do not need an appointment. Their hours are Mon-Fri 7am-6pm.  If you have labs (blood work) drawn today and your tests are completely normal, you will receive your results only by: Marland Kitchen MyChart Message (if you have MyChart) OR . A paper copy in the mail If you have any lab test that is abnormal or we need to change your treatment, we will call you to review the results.  Testing/Procedures: Your physician has requested that you have an echocardiogram. Echocardiography is a painless test that uses sound waves to create images of your heart. It provides your doctor with information about the size and shape of your heart and how well your heart's chambers and valves are working. This procedure takes approximately one hour. There are no restrictions for this procedure.  Your physician has requested that you have cardiac CT. Cardiac computed tomography (CT) is a painless test that uses an x-ray machine to take clear, detailed pictures of your heart. For further information please visit [email or web address]. Please follow instruction sheet as given.     Follow-Up: At [facility], you and your health needs are our priority.  As part of our continuing mission to provide you with exceptional heart care, we have created designated Provider Care Teams.  These Care Teams include your primary Cardiologist (physician) and Advanced Practice Providers (APPs -  Physician Assistants and Nurse Practitioners) who all work  together to provide you with the care you need, when you need it.  Your next appointment:   4 week(s)  The format for your next appointment:   In Person  Provider:    You may see  Dr. Garen Lah or one of the following Advanced Practice Providers on your designated Care Team:    Murray Hodgkins, NP  Christell Faith, PA-C  Marrianne Mood, PA-C   Other Instructions Your cardiac CT will be scheduled at one of the below locations:   [facility] [address] [place], [place] [address] [phone]  [place] [address] [place], [place] [address] [phone]  If scheduled at [facility], please arrive at the [facility] main entrance of [facility] 30 minutes prior to test start time. Proceed to the [facility] Radiology Department (first floor) to check-in and test prep.  If scheduled at [facility], please arrive 15 mins early for check-in and test prep.  Please follow these instructions carefully (unless otherwise directed):  Hold all erectile dysfunction medications at least 3 days (72 hrs) prior to test.  On the Night Before the Test: . Be sure to Drink plenty of water. . Do not consume any caffeinated/decaffeinated beverages or chocolate 12 hours prior to your test. . Do not take any antihistamines 12 hours prior to your test. . If you take Metformin do not take 24 hours prior to test.  On the Day of the Test: . Drink plenty of water. Do not drink any water  within one hour of the test. . Do not eat any food 4 hours prior to the test. . You may take your regular medications prior to the test.  . Take metoprolol (Lopressor) two hours prior to test. . HOLD Furosemide/Hydrochlorothiazide morning of the test.        After the Test: . Drink plenty of water. . After receiving IV contrast, you may experience a mild flushed feeling. This is normal. . On  occasion, you may experience a mild rash up to 24 hours after the test. This is not dangerous. If this occurs, you can take Benadryl 25 mg and increase your fluid intake. . If you experience trouble breathing, this can be serious. If it is severe call 911 IMMEDIATELY. If it is mild, please call our office. . If you take any of these medications: Glipizide/Metformin, Avandament, Glucavance, please do not take 48 hours after completing test unless otherwise instructed.   Once we have confirmed authorization from your insurance company, we will call you to set up a date and time for your test.   For non-scheduling related questions, please contact the cardiac imaging nurse navigator should you have any questions/concerns: Marchia Bond, RN Navigator Cardiac Imaging [facility] [phone] mobile

## 2019-02-20 NOTE — Progress Notes (Signed)
Cardiology Office Note:    Date:  02/20/2019   ID:  [name], DOB [birth year], MRN [id number]  PCP:  Theotis Burrow, MD  Cardiologist:  No primary care provider on file.  Electrophysiologist:  None   Referring MD: Theotis Burrow*   Chief Complaint  Patient presents with  . other    Sob and edema. Meds reviewed verbally with pt.    History of Present Illness:    [name] is a 54 y.o. male with a hx of COPD, current smoker x40+ years, sleep apnea, diabetes, hypertension, hyperlipidemia who presents due to shortness of breath.  Patient states having worsening shortness of breath over the past 6 months.  He finds it difficult taking out his trash to his driveway and back to his home without getting severely short of breath.  He denies chest pain or palpitations.  He is a current smoker x40+ years.  He has a strong family history of CAD with early MIs in his brother age 101s, sister 16s.  He denies chest pain, palpitations, syncope.  Endorses occasional dry cough.  Past Medical History:  Diagnosis Date  . COPD (chronic obstructive pulmonary disease) ([place])   . Coronary artery disease   . Diabetes mellitus without complication ([place])   . Hyperlipidemia   . Hypertension   . Peptic ulcer   . Sleep apnea     Past Surgical History:  Procedure Laterality Date  . COLONOSCOPY WITH PROPOFOL N/A 11/09/2016   Procedure: COLONOSCOPY WITH PROPOFOL;  Surgeon: Lollie Sails, MD;  Location[facility] ENDOSCOPY;  Service: Endoscopy;  Laterality: N/A;  . COLONOSCOPY WITH PROPOFOL N/A 07/06/2017   Procedure: COLONOSCOPY WITH PROPOFOL;  Surgeon: Lollie Sails, MD;  Location: [facility] ENDOSCOPY;  Service: Endoscopy;  Laterality: N/A;  . ESOPHAGOGASTRODUODENOSCOPY (EGD) WITH PROPOFOL N/A 11/09/2016   Procedure: ESOPHAGOGASTRODUODENOSCOPY (EGD) WITH PROPOFOL;  Surgeon: Lollie Sails, MD;  Location: [facility] ENDOSCOPY;  Service: Endoscopy;  Laterality: N/A;  .  ESOPHAGOGASTRODUODENOSCOPY (EGD) WITH PROPOFOL N/A 07/06/2017   Procedure: ESOPHAGOGASTRODUODENOSCOPY (EGD) WITH PROPOFOL;  Surgeon: Lollie Sails, MD;  Location: [facility] ENDOSCOPY;  Service: Endoscopy;  Laterality: N/A;    Current Medications: Current Meds  Medication Sig  . albuterol (VENTOLIN HFA) 108 (90 Base) MCG/ACT inhaler Inhale 2 puffs into the lungs every 4 (four) hours as needed for wheezing or shortness of breath.   Marland Kitchen aspirin-acetaminophen-caffeine (EXCEDRIN MIGRAINE) 250-250-65 MG tablet Take by mouth every 6 (six) hours as needed for headache.  Marland Kitchen atorvastatin (LIPITOR) 20 MG tablet Take 20 mg by mouth at bedtime.   Marland Kitchen buPROPion (WELLBUTRIN XL) 300 MG 24 hr tablet Take 300 mg by mouth daily.  . calcium carbonate (CALCIUM 600) 600 MG TABS tablet Take 1 tablet (600 mg total) by mouth 2 (two) times daily with a meal for 30 days.  . Fluticasone-Salmeterol (ADVAIR DISKUS) 100-50 MCG/DOSE AEPB Inhale 1 puff into the lungs 2 (two) times daily.   . furosemide (LASIX) 20 MG tablet Take 1 tablet (20 mg total) by mouth daily.  Marland Kitchen gabapentin (NEURONTIN) 300 MG capsule Take 300 mg by mouth 3 (three) times daily.   . hydrochlorothiazide (MICROZIDE) 12.5 MG capsule Take 12.5 mg by mouth daily.   Marland Kitchen loratadine (CLARITIN) 10 MG tablet Take 10 mg by mouth daily.  Marland Kitchen losartan (COZAAR) 50 MG tablet Take 1 tablet (50 mg total) by mouth daily.  . Magnesium 500 MG CAPS Take 1 capsule (500 mg total) by mouth at bedtime.  . meloxicam (  MOBIC) 15 MG tablet Take 1 tablet (15 mg total) by mouth daily.  . metFORMIN (GLUCOPHAGE) 1000 MG tablet Take 1,000 mg by mouth 2 (two) times daily with a meal.  . nortriptyline (PAMELOR) 25 MG capsule Take 25 mg by mouth at bedtime.  Marland Kitchen oxybutynin (DITROPAN) 5 MG tablet Take 5 mg by mouth every 12 (twelve) hours.  . pantoprazole (PROTONIX) 40 MG tablet Take 40 mg by mouth 2 (two) times daily.   Marland Kitchen tiZANidine (ZANAFLEX) 4 MG tablet Take 1 tablet (4 mg total) by mouth every 8  (eight) hours as needed for muscle spasms.  . traMADol (ULTRAM) 50 MG tablet Take 1 tablet (50 mg total) by mouth every 6 (six) hours as needed for severe pain. Must last 30 days Max: 4/day  . TRUEPLUS 5-BEVEL PEN NEEDLES 31G X 6 MM MISC   . VICTOZA 18 MG/3ML SOPN daily.      Allergies:   Shellfish allergy, Codeine, and Lisinopril   Social History   Socioeconomic History  . Marital status: Single    Spouse name: Not on file  . Number of children: Not on file  . Years of education: Not on file  . Highest education level: Not on file  Occupational History  . Not on file  Tobacco Use  . Smoking status: Current Every Day Smoker    Packs/day: 1.00    Years: 30.00    Pack years: 30.00    Types: Cigarettes  . Smokeless tobacco: Never Used  . Tobacco comment: trying decrease  Substance and Sexual Activity  . Alcohol use: No  . Drug use: No  . Sexual activity: Never  Other Topics Concern  . Not on file  Social History Narrative  . Not on file   Social Determinants of Health   Financial Resource Strain:   . Difficulty of Paying Living Expenses: Not on file  Food Insecurity:   . Worried About Charity fundraiser in the Last Year: Not on file  . Ran Out of Food in the Last Year: Not on file  Transportation Needs:   . Lack of Transportation (Medical): Not on file  . Lack of Transportation (Non-Medical): Not on file  Physical Activity:   . Days of Exercise per Week: Not on file  . Minutes of Exercise per Session: Not on file  Stress:   . Feeling of Stress : Not on file  Social Connections:   . Frequency of Communication with Friends and Family: Not on file  . Frequency of Social Gatherings with Friends and Family: Not on file  . Attends Religious Services: Not on file  . Active Member of Clubs or Organizations: Not on file  . Attends Archivist Meetings: Not on file  . Marital Status: Not on file     Family History: The patient's family history includes  Diabetes in his father and mother; Heart attack in his father, paternal aunt, and sister; Stomach cancer in his father.  ROS:   Please see the history of present illness.     All other systems reviewed and are negative.  EKGs/Labs/Other Studies Reviewed:    The following studies were reviewed today:   EKG:  EKG is  ordered today.  The ekg ordered today demonstrates normal sinus rhythm, normal ECG.  Recent Labs: 06/29/2018: Hemoglobin 12.9; Magnesium 2.0; Platelets 247 06/30/2018: BUN 14; Creatinine, Ser 1.27; Potassium 3.7; Sodium 142  Recent Lipid Panel No results found for: CHOL, TRIG, HDL, CHOLHDL, VLDL, LDLCALC, LDLDIRECT  Physical Exam:    VS:  BP 114/75 (BP Location: Right Arm, Patient Position: Sitting, Cuff Size: Large)   Pulse 91   Ht 5\' 9"  (1.753 m)   Wt (!) 313 lb 4 oz (142.1 kg)   SpO2 98%   BMI 46.26 kg/m     Wt Readings from Last 3 Encounters:  02/20/19 (!) 313 lb 4 oz (142.1 kg)  01/24/19 (!) 314 lb 12.8 oz (142.8 kg)  01/10/19 (!) 316 lb (143.3 kg)     GEN:  Well nourished, well developed in no acute distress HEENT: Normal NECK: No JVD; No carotid bruits LYMPHATICS: No lymphadenopathy CARDIAC: RRR, no murmurs, rubs, gallops RESPIRATORY: Poor inspiratory effort ABDOMEN: Soft, non-tender, non-distended MUSCULOSKELETAL: Raised edema; No deformity  SKIN: Warm and dry NEUROLOGIC:  Alert and oriented x 3 PSYCHIATRIC:  Normal affect   ASSESSMENT:    1. Dyspnea on exertion   2. Smoking   3. Essential hypertension   4. Pure hypercholesterolemia    PLAN:    In order of problems listed above:  1. Patient with worsening dyspnea on exertion.  This could be an anginal equivalent in light of risk factors of diabetes, hypertension, hyperlipidemia, current smoker.  We will evaluate patient with echocardiogram and coronary CT angiogram.  Worsening COPD/emphysema is also in the differential.  If heart function tests are normal, then his worsening symptoms are  likely due to his pulmonary disease. 2. Long history of smoking.  Cessation advised.  Over 5 minutes spent counseling patient. 3. Blood pressure adequately controlled.  Continue HCTZ and losartan as currently prescribed. 4. History of hyperlipidemia.  Continue Lipitor 20 mg daily as currently prescribed. 5. Morbid obesity.  Weight loss advised.  Follow-up after echo and coronary CTA.  This note was generated in part or whole with voice recognition software. Voice recognition is usually quite accurate but there are transcription errors that can and very often do occur. I apologize for any typographical errors that were not detected and corrected.  Medication Adjustments/Labs and Tests Ordered: Current medicines are reviewed at length with the patient today.  Concerns regarding medicines are outlined above.  Orders Placed This Encounter  Procedures  . CT CORONARY MORPH W/CTA COR W/SCORE W/CA W/CM &/OR WO/CM  . CT CORONARY FRACTIONAL FLOW RESERVE DATA PREP  . CT CORONARY FRACTIONAL FLOW RESERVE FLUID ANALYSIS  . Basic metabolic panel  . EKG 12-Lead  . ECHOCARDIOGRAM COMPLETE   Meds ordered this encounter  Medications  . metoprolol tartrate (LOPRESSOR) 100 MG tablet    Sig: Take 1 tablet (100 mg) 2 hours prior to your Cardiac CTA    Dispense:  1 tablet    Refill:  0    Patient Instructions  Medication Instructions:  Your physician recommends that you continue on your current medications as directed. Please refer to the Current Medication list given to you today.  A one time dose of Metoprolol 100 mg has been sent to your pharmacy. Take 2 hours prior to your Cardiac CTA  *If you need a refill on your cardiac medications before your next appointment, please call your pharmacy*  Lab Work: You will need labwork prior to your CT. Please have your lab drawn at the [facility] mall. You do not need an appointment. Their hours are Mon-Fri 7am-6pm.  If you have labs (blood work) drawn  today and your tests are completely normal, you will receive your results only by: Marland Kitchen MyChart Message (if you have MyChart) OR . A paper  copy in the mail If you have any lab test that is abnormal or we need to change your treatment, we will call you to review the results.  Testing/Procedures: Your physician has requested that you have an echocardiogram. Echocardiography is a painless test that uses sound waves to create images of your heart. It provides your doctor with information about the size and shape of your heart and how well your heart's chambers and valves are working. This procedure takes approximately one hour. There are no restrictions for this procedure.  Your physician has requested that you have cardiac CT. Cardiac computed tomography (CT) is a painless test that uses an x-ray machine to take clear, detailed pictures of your heart. For further information please visit [email or web address]. Please follow instruction sheet as given.     Follow-Up: At [facility], you and your health needs are our priority.  As part of our continuing mission to provide you with exceptional heart care, we have created designated Provider Care Teams.  These Care Teams include your primary Cardiologist (physician) and Advanced Practice Providers (APPs -  Physician Assistants and Nurse Practitioners) who all work together to provide you with the care you need, when you need it.  Your next appointment:   4 week(s)  The format for your next appointment:   In Person  Provider:    You may see  Dr. Garen Lah or one of the following Advanced Practice Providers on your designated Care Team:    Murray Hodgkins, NP  Christell Faith, PA-C  Marrianne Mood, PA-C   Other Instructions Your cardiac CT will be scheduled at one of the below locations:   [facility] [address] [place], [place] [address] [phone]  [place] [address] [place], [place] [address] [phone]  If scheduled at [facility], please arrive at the [facility] main entrance of [facility] 30 minutes prior to test start time. Proceed to the [facility] Radiology Department (first floor) to check-in and test prep.  If scheduled at [facility], please arrive 15 mins early for check-in and test prep.  Please follow these instructions carefully (unless otherwise directed):  Hold all erectile dysfunction medications at least 3 days (72 hrs) prior to test.  On the Night Before the Test: . Be sure to Drink plenty of water. . Do not consume any caffeinated/decaffeinated beverages or chocolate 12 hours prior to your test. . Do not take any antihistamines 12 hours prior to your test. . If you take Metformin do not take 24 hours prior to test.  On the Day of the Test: . Drink plenty of water. Do not drink any water within one hour of the test. . Do not eat any food 4 hours prior to the test. . You may take your regular medications prior to the test.  . Take metoprolol (Lopressor) two hours prior to test. . HOLD Furosemide/Hydrochlorothiazide morning of the test.        After the Test: . Drink plenty of water. . After receiving IV contrast, you may experience a mild flushed feeling. This is normal. . On occasion, you may experience a mild rash up to 24 hours after the test. This is not dangerous. If this occurs, you can take Benadryl 25 mg and increase your fluid intake. . If you experience trouble breathing, this can be serious. If it is severe call 911 IMMEDIATELY. If it is mild, please  call our office. . If you take any of these medications: Glipizide/Metformin, Avandament, Glucavance, please do not take 48 hours after completing test unless otherwise instructed.   Once we have confirmed authorization from your insurance company, we will call you to set up a date and time for your test.   For  non-scheduling related questions, please contact the cardiac imaging nurse navigator should you have any questions/concerns: Marchia Bond, RN Navigator Cardiac Imaging [facility] [phone] mobile       Signed, Kate Sable, MD  02/20/2019 4:44 PM    [place]

## 2019-02-21 ENCOUNTER — Ambulatory Visit (INDEPENDENT_AMBULATORY_CARE_PROVIDER_SITE_OTHER): Payer: Medicare Other | Admitting: Nurse Practitioner

## 2019-02-23 ENCOUNTER — Encounter: Payer: Self-pay | Admitting: Pain Medicine

## 2019-02-23 ENCOUNTER — Telehealth: Payer: Self-pay | Admitting: *Deleted

## 2019-02-23 NOTE — Telephone Encounter (Signed)
Attempted to call for pre appointment review of allergies/meds. Message left. 

## 2019-02-26 NOTE — Progress Notes (Signed)
Patient: [name]  Service Category: E/M  Provider: Gaspar Cola, MD  DOB: 11-25-1965  DOS: 02/27/2019  Location: Office  MRN: [id number]  Setting: Ambulatory outpatient  Referring Provider: Theotis Burrow*  Type: Established Patient  Specialty: Interventional Pain Management  PCP: Theotis Burrow, MD  Location: Remote location  Delivery: TeleHealth     Virtual Encounter - Pain Management PROVIDER NOTE: Information contained herein reflects review and annotations entered in association with encounter. Interpretation of such information and data should be left to medically-trained personnel. Information provided to patient can be located elsewhere in the medical record under "Patient Instructions". Document created using STT-dictation technology, any transcriptional errors that may result from process are unintentional.    Contact & Pharmacy Preferred: [phone] Home: [phone] (home) Mobile: [phone] (mobile) E-mail: [email or web address]  [place], [place] - [address] [place] [address] Phone: [phone] Fax: [phone]  [facility] [address] [place] - [place] [address] Phone: [phone] Fax: [phone]   Pre-screening  [name] offered "in-person" vs "virtual" encounter. He indicated preferring virtual for this encounter.   Reason COVID-19*  Social distancing based on CDC and AMA recommendations.   I contacted [name] on 02/27/2019 via telephone.      I clearly identified myself as Gaspar Cola, MD. I verified that I was speaking with the correct person using two identifiers (Name: [name], and date of birth: 08-16-1965).  Consent I sought verbal advanced consent from [name] for virtual visit interactions. I informed [name] of possible security and privacy concerns, risks, and limitations  associated with providing "not-in-person" medical evaluation and management services. I also informed [name] of the availability of "in-person" appointments. Finally, I informed him that there would be a charge for the virtual visit and that he could be  personally, fully or partially, financially responsible for it. [name] expressed understanding and agreed to proceed.   Historic Elements   [name] is a 54 y.o. year old, male patient evaluated today after his last contact with our practice on 02/23/2019. [name]  has a past medical history of COPD (chronic obstructive pulmonary disease) ([place]), Coronary artery disease, Diabetes mellitus without complication ([place]), Hyperlipidemia, Hypertension, Peptic ulcer, and Sleep apnea. He also  has a past surgical history that includes Esophagogastroduodenoscopy (egd) with propofol (N/A, 11/09/2016); Colonoscopy with propofol (N/A, 11/09/2016); Colonoscopy with propofol (N/A, 07/06/2017); and Esophagogastroduodenoscopy (egd) with propofol (N/A, 07/06/2017). [name] has a current medication list which includes the following prescription(s): albuterol, aspirin-acetaminophen-caffeine, atorvastatin, bupropion, fluticasone-salmeterol, furosemide, gabapentin, hydrochlorothiazide, loratadine, losartan, magnesium, meloxicam, [START ON 05/20/2019] meloxicam, metformin, metoprolol tartrate, nortriptyline, oxybutynin, pantoprazole, tizanidine, [START ON 05/20/2019] tizanidine, trueplus 5-bevel pen needles, victoza, and calcium carbonate. He  reports that he has been smoking cigarettes. He has a 30.00 pack-year smoking history. He has never used smokeless tobacco. He reports that he does not drink alcohol or use drugs. [name] is allergic to shellfish allergy; codeine; and lisinopril.   HPI  Today, he is being contacted for medication management.  PMP indicates that the patient is currently not taking any opioid analgesics and therefore there is a discrepancy  between the medication reconciliation where he indicated that he is taking the tramadol and the report from the prescription monitoring program where there is no controlled substances.  The last time I had an encounter for medication management  with this patient was on 08/31/2018 at which time I had asked him to confirm his pharmacy.  Despite the fact that he did, on 11/21/2018, almost 2 months after I had sent his initial prescriptions, he called to indicate that we had sent his prescriptions to the wrong pharmacy and at that time I had to resend all other prescriptions again to [facility].  Today in reviewing his medical record, I see that he reported to the nursing staff that he was taking his tramadol when in fact the PMP reveals that he has not had it filled.  I am really concerned that he is not being completely forthcoming with [place] and/or he is not really paying attention to the questions that we are asking him.  In any case, the last set of prescriptions that I sent to his pharmacy should last until 05/30/2019 and therefore he does not need any refills today.   According to the patient, he did not have his tramadol filled because his primary care physician told him that since he was allergic to codeine, that he could not take that tramadol either.  Since the patient has not need the medication, we will simply delete it from his list of medicines.  He is still taking the Mobic and the tizanidine.  The patient indicates doing well with the current medication regimen. No adverse reactions or side effects reported to the medications.  He indicates that he is having quite a bit more of the low back pain and he would like to have that the injections repeated.  In the past we had done some diagnostic lumbar facet blocks as well and some transforaminal injections.  He indicated that the lumbar facet injections worked better and he would like to have those repeated.  We will go ahead and take care of  that for him today.  Pharmacotherapy Assessment  Analgesic: No opioid analgesics prescribed by our practice. MME/day: 0 mg/day.   Monitoring: [place] PMP: PDMP reviewed during this encounter.       Pharmacotherapy: No side-effects or adverse reactions reported. Compliance: No problems identified. Effectiveness: Clinically acceptable. Plan: Refer to "POC".  UDS:  Summary  Date Value Ref Range Status  10/07/2017 FINAL  Final    Comment:    ==================================================================== TOXASSURE COMP DRUG ANALYSIS,UR ==================================================================== Test                             Result       Flag       Units Drug Present and Declared for Prescription Verification   Bupropion                      PRESENT      EXPECTED   Hydroxybupropion               PRESENT      EXPECTED    Hydroxybupropion is an expected metabolite of bupropion. Drug Present not Declared for Prescription Verification   Acetaminophen                  PRESENT      UNEXPECTED ==================================================================== Test                      Result    Flag   Units      Ref Range   Creatinine  157              mg/dL      >=20 ==================================================================== Declared Medications:  The flagging and interpretation on this report are based on the  following declared medications.  Unexpected results may arise from  inaccuracies in the declared medications.  **Note: The testing scope of this panel includes these medications:  Bupropion  **Note: The testing scope of this panel does not include following  reported medications:  Albuterol  Atorvastatin  Fluticasone (Advair)  Furosemide (Lasix)  Hydrochlorothiazide (Microzide)  Loratadine (Claritin)  Losartan (Cozaar)  Metformin  Oxybutynin (Ditropan)  Pantoprazole (Protonix)  Salmeterol (Advair)  Tiotropium  (Spiriva) ==================================================================== For clinical consultation, please call [phone]. ====================================================================    Laboratory Chemistry Profile   Renal Lab Results  Component Value Date   BUN 14 06/30/2018   CREATININE 1.27 (H) 06/30/2018   BCR 9 10/07/2017   GFRAA >60 06/30/2018   GFRNONAA >60 06/30/2018    Hepatic Lab Results  Component Value Date   AST 21 10/15/2017   ALT 23 10/15/2017   ALBUMIN 4.0 10/15/2017   ALKPHOS 85 10/15/2017    Electrolytes Lab Results  Component Value Date   NA 142 06/30/2018   K 3.7 06/30/2018   CL 103 06/30/2018   CALCIUM 8.8 (L) 06/30/2018   MG 2.0 06/29/2018    Bone Lab Results  Component Value Date   25OHVITD1 29 (L) 10/07/2017   25OHVITD2 <1.0 10/07/2017   25OHVITD3 29 10/07/2017    Coagulation Lab Results  Component Value Date   PLT 247 06/29/2018    Cardiovascular Lab Results  Component Value Date   CKTOTAL 239 06/27/2018   TROPONINI 0.05 (HH) 05/19/2018   HGB 12.9 (L) 06/29/2018   HCT 40.6 06/29/2018    Inflammation (CRP: Acute Phase) (ESR: Chronic Phase) Lab Results  Component Value Date   CRP 5 10/07/2017   ESRSEDRATE 23 10/07/2017   LATICACIDVEN 2.7 ([place]) 06/27/2018      Note: Above Lab results reviewed.  Imaging  VAS [place] ABI WITH/WO TBI LOWER EXTREMITY DOPPLER STUDY  Indications: Cellulitis.   Comparison Study: 02/11/2016  Performing Technologist: Almira Coaster RVS    Examination Guidelines: A complete evaluation includes at minimum, Doppler waveform signals and systolic blood pressure reading at the level of bilateral brachial, anterior tibial, and posterior tibial arteries, when vessel segments are accessible. Bilateral testing is considered an integral part of a complete examination. Photoelectric Plethysmograph (PPG) waveforms and toe systolic pressure readings are included as required and additional  duplex testing as needed. Limited examinations for reoccurring indications may be performed as noted.    ABI Findings: +---------+------------------+-----+---------+--------+ Right    Rt Pressure (mmHg)IndexWaveform Comment  +---------+------------------+-----+---------+--------+ Brachial 134                                      +---------+------------------+-----+---------+--------+ ATA      135               0.99 triphasic         +---------+------------------+-----+---------+--------+ PTA      138               1.01 triphasic         +---------+------------------+-----+---------+--------+ Great Toe196               1.44 Normal            +---------+------------------+-----+---------+--------+  +---------+------------------+-----+---------+-------+  Left     Lt Pressure (mmHg)IndexWaveform Comment +---------+------------------+-----+---------+-------+ Brachial 136                                     +---------+------------------+-----+---------+-------+ ATA      137               1.01 triphasic        +---------+------------------+-----+---------+-------+ PTA      140               1.03 triphasic        +---------+------------------+-----+---------+-------+ Great Toe165               1.21 Normal           +---------+------------------+-----+---------+-------+  +-------+-----------+-----------+------------+------------+ ABI/TBIToday's ABIToday's TBIPrevious ABIPrevious TBI +-------+-----------+-----------+------------+------------+ Right  1.01       1.44       1.06        .68          +-------+-----------+-----------+------------+------------+ Left   1.03       1.21       1.13        .63          +-------+-----------+-----------+------------+------------+  Bilateral ABIs appear essentially unchanged compared to prior study on 02/11/2016. Bilateral TBIs appear increased compared to prior study on  02/11/2016.   Summary: Right: Resting right ankle-brachial index is within normal range. No evidence of significant right lower extremity arterial disease. The right toe-brachial index is normal.  Left: Resting left ankle-brachial index is within normal range. No evidence of significant left lower extremity arterial disease. The left toe-brachial index is normal.    *See table(s) above for measurements and observations.    Electronically signed by Leotis Pain MD on 07/12/2018 at 12:25:59 PM.       Final    Assessment  The primary encounter diagnosis was Chronic low back pain (Primary Area of Pain) (Bilateral) (R>L) w/ sciatica (Bilateral). Diagnoses of Lumbar facet syndrome (Bilateral) (R>L), Chronic lower extremity pain (Secondary Area of Pain) (Bilateral) (R>L), Chronic pain syndrome, Chronic musculoskeletal pain, Osteoarthritis of facet joint of lumbar spine, Osteoarthritis involving multiple joints, and Vitamin D insufficiency were also pertinent to this visit.  Plan of Care  Problem-specific:  No problem-specific Assessment & Plan notes found for this encounter.  I have discontinued Wendie Chess. Crutcher's traMADol. I am also having him start on meloxicam and tiZANidine. Additionally, I am having him maintain his loratadine, atorvastatin, Fluticasone-Salmeterol, losartan, hydrochlorothiazide, metFORMIN, pantoprazole, albuterol, oxybutynin, aspirin-acetaminophen-caffeine, calcium carbonate, buPROPion, furosemide, gabapentin, tiZANidine, meloxicam, Magnesium, nortriptyline, TRUEplus 5-Bevel Pen Needles, Victoza, and metoprolol tartrate.  Pharmacotherapy (Medications Ordered): Meds ordered this encounter  Medications  . meloxicam (MOBIC) 15 MG tablet    Sig: Take 1 tablet (15 mg total) by mouth daily.    Dispense:  90 tablet    Refill:  1    Fill one day early if pharmacy is closed on scheduled refill date. May substitute for generic if available.  Marland Kitchen tiZANidine (ZANAFLEX) 4 MG tablet     Sig: Take 1 tablet (4 mg total) by mouth every 8 (eight) hours as needed for muscle spasms.    Dispense:  90 tablet    Refill:  5    Fill one day early if pharmacy is closed on scheduled refill date. May substitute for generic if available.   Orders:  Orders Placed This Encounter  Procedures  .  LUMBAR FACET(MEDIAL BRANCH NERVE BLOCK) MBNB    Standing Status:   Future    Standing Expiration Date:   03/27/2019    Scheduling Instructions:     Procedure: Lumbar facet block (AKA.: Lumbosacral medial branch nerve block)     Side: Bilateral     Level: L3-4, L4-5, & L5-S1 Facets (L2, L3, L4, L5, & S1 Medial Branch Nerves)     Sedation: Patient's choice.     Timeframe: ASAA    Order Specific Question:   Where will this procedure be performed?    Answer:   [facility]   Follow-up plan:   Return for Procedure (w/ sedation): (B) L-FCT BLK #3.      Interventional management options:  Considerations:   NOTE:NO RFAuntil BMI is <35 Diagnostic bilateral L4 transforaminal LESI#1 Diagnostic (Midline) L3-4 LESI #1 Possible bilateral lumbar facetRFA   Palliative PRN treatment(s):   Diagnostic bilateral L4 transforaminal LESI #2 + Midline L3-4 interlaminar LESI #2  Diagnostic bilateral lumbar facet block#3under fluoroscopic guidance and IV sedation      Recent Visits No visits were found meeting these conditions.  Showing recent visits within past 90 days and meeting all other requirements   Today's Visits Date Type Provider Dept  02/27/19 Office Visit Milinda Pointer, MD [facility]  Showing today's visits and meeting all other requirements   Future Appointments No visits were found meeting these conditions.  Showing future appointments within next 90 days and meeting all other requirements   I discussed the assessment and treatment plan with the patient. The patient was provided an opportunity to ask questions and all were answered. The patient agreed with  the plan and demonstrated an understanding of the instructions.  Patient advised to call back or seek an in-person evaluation if the symptoms or condition worsens.  Duration of encounter: 15 minutes.  Note by: Gaspar Cola, MD Date: 02/27/2019; Time: 11:23 AM

## 2019-02-27 ENCOUNTER — Ambulatory Visit: Payer: Medicare Other | Attending: Pain Medicine | Admitting: Pain Medicine

## 2019-02-27 ENCOUNTER — Other Ambulatory Visit: Payer: Self-pay

## 2019-02-27 DIAGNOSIS — M5442 Lumbago with sciatica, left side: Secondary | ICD-10-CM

## 2019-02-27 DIAGNOSIS — M47816 Spondylosis without myelopathy or radiculopathy, lumbar region: Secondary | ICD-10-CM

## 2019-02-27 DIAGNOSIS — G894 Chronic pain syndrome: Secondary | ICD-10-CM | POA: Diagnosis not present

## 2019-02-27 DIAGNOSIS — M7918 Myalgia, other site: Secondary | ICD-10-CM

## 2019-02-27 DIAGNOSIS — E559 Vitamin D deficiency, unspecified: Secondary | ICD-10-CM

## 2019-02-27 DIAGNOSIS — G8929 Other chronic pain: Secondary | ICD-10-CM

## 2019-02-27 DIAGNOSIS — M8949 Other hypertrophic osteoarthropathy, multiple sites: Secondary | ICD-10-CM

## 2019-02-27 DIAGNOSIS — M5441 Lumbago with sciatica, right side: Secondary | ICD-10-CM

## 2019-02-27 DIAGNOSIS — M79604 Pain in right leg: Secondary | ICD-10-CM

## 2019-02-27 DIAGNOSIS — M79605 Pain in left leg: Secondary | ICD-10-CM

## 2019-02-27 DIAGNOSIS — M159 Polyosteoarthritis, unspecified: Secondary | ICD-10-CM

## 2019-02-27 MED ORDER — TIZANIDINE HCL 4 MG PO TABS: 4.0000 mg | ORAL_TABLET | Freq: Three times a day (TID) | ORAL | 5 refills | Status: DC | PRN

## 2019-02-27 MED ORDER — MELOXICAM 15 MG PO TABS: 15.0000 mg | ORAL_TABLET | Freq: Every day | ORAL | 1 refills | Status: DC

## 2019-02-27 NOTE — Patient Instructions (Signed)

## 2019-03-01 ENCOUNTER — Ambulatory Visit: Payer: Medicare Other | Admitting: Pain Medicine

## 2019-03-02 ENCOUNTER — Other Ambulatory Visit: Payer: Self-pay

## 2019-03-03 ENCOUNTER — Other Ambulatory Visit: Payer: Self-pay

## 2019-03-03 DIAGNOSIS — R072 Precordial pain: Secondary | ICD-10-CM

## 2019-03-03 DIAGNOSIS — I209 Angina pectoris, unspecified: Secondary | ICD-10-CM

## 2019-03-03 NOTE — Progress Notes (Signed)
Cor cta

## 2019-03-09 ENCOUNTER — Other Ambulatory Visit: Payer: Self-pay

## 2019-03-09 ENCOUNTER — Ambulatory Visit (HOSPITAL_BASED_OUTPATIENT_CLINIC_OR_DEPARTMENT_OTHER): Payer: Medicare Other | Admitting: Pain Medicine

## 2019-03-09 ENCOUNTER — Ambulatory Visit
Admission: RE | Admit: 2019-03-09 | Discharge: 2019-03-09 | Disposition: A | Discharge status: Home / Self Care | Payer: Medicare Other | Source: Ambulatory Visit | Attending: Pain Medicine | Admitting: Pain Medicine

## 2019-03-09 ENCOUNTER — Encounter: Payer: Self-pay | Admitting: Pain Medicine

## 2019-03-09 VITALS — BP 146/78 | HR 96 | Temp 98.3°F | Resp 18 | Ht 69.0 in | Wt 312.0 lb

## 2019-03-09 DIAGNOSIS — Z6841 Body Mass Index (BMI) 40.0 and over, adult: Secondary | ICD-10-CM | POA: Insufficient documentation

## 2019-03-09 DIAGNOSIS — M47816 Spondylosis without myelopathy or radiculopathy, lumbar region: Secondary | ICD-10-CM

## 2019-03-09 DIAGNOSIS — G8929 Other chronic pain: Secondary | ICD-10-CM | POA: Insufficient documentation

## 2019-03-09 DIAGNOSIS — M5136 Other intervertebral disc degeneration, lumbar region: Secondary | ICD-10-CM | POA: Diagnosis present

## 2019-03-09 DIAGNOSIS — M545 Low back pain, unspecified: Secondary | ICD-10-CM

## 2019-03-09 DIAGNOSIS — M8949 Other hypertrophic osteoarthropathy, multiple sites: Secondary | ICD-10-CM | POA: Insufficient documentation

## 2019-03-09 DIAGNOSIS — Z91013 Allergy to seafood: Secondary | ICD-10-CM | POA: Insufficient documentation

## 2019-03-09 DIAGNOSIS — M159 Polyosteoarthritis, unspecified: Secondary | ICD-10-CM

## 2019-03-09 MED ORDER — MIDAZOLAM HCL 5 MG/5ML IJ SOLN
1.0000 mg | INTRAMUSCULAR | Status: DC | PRN
  Administered 2019-03-09: 13:00:00 2 mg via INTRAVENOUS
  Filled 2019-03-09: qty 5

## 2019-03-09 MED ORDER — LACTATED RINGERS IV SOLN
1000.0000 mL | Freq: Once | INTRAVENOUS | Status: AC
  Administered 2019-03-09: 13:00:00 1000 mL via INTRAVENOUS

## 2019-03-09 MED ORDER — FENTANYL CITRATE (PF) 100 MCG/2ML IJ SOLN
25.0000 ug | INTRAMUSCULAR | Status: DC | PRN
  Administered 2019-03-09: 13:00:00 50 ug via INTRAVENOUS
  Filled 2019-03-09: qty 2

## 2019-03-09 MED ORDER — TRIAMCINOLONE ACETONIDE 40 MG/ML IJ SUSP
INTRAMUSCULAR | Status: AC
  Filled 2019-03-09: qty 1

## 2019-03-09 MED ORDER — TRIAMCINOLONE ACETONIDE 40 MG/ML IJ SUSP
80.0000 mg | Freq: Once | INTRAMUSCULAR | Status: AC
  Administered 2019-03-09: 13:00:00 80 mg
  Filled 2019-03-09: qty 2

## 2019-03-09 MED ORDER — LIDOCAINE HCL 2 % IJ SOLN
20.0000 mL | Freq: Once | INTRAMUSCULAR | Status: AC
  Administered 2019-03-09: 13:00:00 400 mg
  Filled 2019-03-09: qty 40

## 2019-03-09 MED ORDER — ROPIVACAINE HCL 2 MG/ML IJ SOLN
18.0000 mL | Freq: Once | INTRAMUSCULAR | Status: AC
  Administered 2019-03-09: 13:00:00 18 mL via PERINEURAL
  Filled 2019-03-09: qty 20

## 2019-03-09 NOTE — Progress Notes (Signed)
PROVIDER NOTE: Information contained herein reflects review and annotations entered in association with encounter. Interpretation of such information and data should be left to medically-trained personnel. Information provided to patient can be located elsewhere in the medical record under "Patient Instructions". Document created using STT-dictation technology, any transcriptional errors that may result from process are unintentional.    Patient: [name]  Service Category: Procedure  Provider: Gaspar Cola, MD  DOB: 1965/06/01  DOS: 03/09/2019  Location: [facility]  MRN: [id number]  Setting: Ambulatory - outpatient  Referring Provider: Theotis Burrow*  Type: Established Patient  Specialty: Interventional Pain Management  PCP: Theotis Burrow, MD   Primary Reason for Visit: Interventional Pain Management Treatment. CC: Back Pain  Procedure:          Anesthesia, Analgesia, Anxiolysis:  Type: Lumbar Facet, Medial Branch Block(s) #3  Primary Purpose: Palliative Region: Posterolateral Lumbosacral Spine Level: L2, L3, L4, L5, & S1 Medial Branch Level(s). Injecting these levels blocks the L3-4, L4-5, and L5-S1 lumbar facet joints. Laterality: Bilateral  Type: Moderate (Conscious) Sedation combined with Local Anesthesia Indication(s): Analgesia and Anxiety Route: Intravenous (IV) IV Access: Secured Sedation: Meaningful verbal contact was maintained at all times during the procedure  Local Anesthetic: Lidocaine 1-2%  Position: Prone   Indications: 1. Lumbar facet syndrome (Bilateral) (R>L)   2. Spondylosis without myelopathy or radiculopathy, lumbar region   3. DDD (degenerative disc disease), lumbar   4. Lumbar facet hypertrophy (Bilateral)   5. Chronic low back pain (Bilateral) w/o sciatica   6. History of allergy to shellfish   7. Morbid obesity with BMI of 45.0-49.9, adult ([place])   8. Osteoarthritis involving multiple joints    Pain  Score: Pre-procedure: 4 /10 Post-procedure: 0-No pain/10   Pre-op Assessment:  [name] is a 54 y.o. (year old), male patient, seen today for interventional treatment. He  has a past surgical history that includes Esophagogastroduodenoscopy (egd) with propofol (N/A, 11/09/2016); Colonoscopy with propofol (N/A, 11/09/2016); Colonoscopy with propofol (N/A, 07/06/2017); and Esophagogastroduodenoscopy (egd) with propofol (N/A, 07/06/2017). Mr. Cleary has a current medication list which includes the following prescription(s): albuterol, aspirin-acetaminophen-caffeine, atorvastatin, bupropion, fluticasone-salmeterol, trelegy ellipta, furosemide, gabapentin, hydrochlorothiazide, loratadine, losartan, magnesium, [START ON 05/20/2019] meloxicam, metformin, metoprolol tartrate, nicotine, nortriptyline, oxybutynin, pantoprazole, [START ON 05/20/2019] tizanidine, trueplus 5-bevel pen needles, victoza, calcium carbonate, meloxicam, and tizanidine, and the following Facility-Administered Medications: fentanyl and midazolam. His primarily concern today is the Back Pain  Initial Vital Signs:  Pulse/HCG Rate: 96ECG Heart Rate: 100 Temp: 98.1 F (36.7 C) Resp: (!) 22 BP: (!) 162/95 SpO2: 98 %  BMI: Estimated body mass index is 46.07 kg/m as calculated from the following:   Height as of this encounter: 5\' 9"  (1.753 m).   Weight as of this encounter: 312 lb (141.5 kg).  Risk Assessment: Allergies: Reviewed. He is allergic to shellfish allergy; codeine; and lisinopril.  Allergy Precautions: None required Coagulopathies: Reviewed. None identified.  Blood-thinner therapy: None at this time Active Infection(s): Reviewed. None identified. [name] is afebrile  Site Confirmation: [name] was asked to confirm the procedure and laterality before marking the site Procedure checklist: Completed Consent: Before the procedure and under the influence of no sedative(s), amnesic(s), or anxiolytics, the patient was  informed of the treatment options, risks and possible complications. To fulfill our ethical and legal obligations, as recommended by the American Medical Association's Code of Ethics, I have informed the patient of my clinical impression; the nature and purpose of the treatment  or procedure; the risks, benefits, and possible complications of the intervention; the alternatives, including doing nothing; the risk(s) and benefit(s) of the alternative treatment(s) or procedure(s); and the risk(s) and benefit(s) of doing nothing. The patient was provided information about the general risks and possible complications associated with the procedure. These may include, but are not limited to: failure to achieve desired goals, infection, bleeding, organ or nerve damage, allergic reactions, paralysis, and death. In addition, the patient was informed of those risks and complications associated to Spine-related procedures, such as failure to decrease pain; infection (i.e.: Meningitis, epidural or intraspinal abscess); bleeding (i.e.: epidural hematoma, subarachnoid hemorrhage, or any other type of intraspinal or peri-dural bleeding); organ or nerve damage (i.e.: Any type of peripheral nerve, nerve root, or spinal cord injury) with subsequent damage to sensory, motor, and/or autonomic systems, resulting in permanent pain, numbness, and/or weakness of one or several areas of the body; allergic reactions; (i.e.: anaphylactic reaction); and/or death. Furthermore, the patient was informed of those risks and complications associated with the medications. These include, but are not limited to: allergic reactions (i.e.: anaphylactic or anaphylactoid reaction(s)); adrenal axis suppression; blood sugar elevation that in diabetics may result in ketoacidosis or comma; water retention that in patients with history of congestive heart failure may result in shortness of breath, pulmonary edema, and decompensation with resultant heart  failure; weight gain; swelling or edema; medication-induced neural toxicity; particulate matter embolism and blood vessel occlusion with resultant organ, and/or nervous system infarction; and/or aseptic necrosis of one or more joints. Finally, the patient was informed that Medicine is not an exact science; therefore, there is also the possibility of unforeseen or unpredictable risks and/or possible complications that may result in a catastrophic outcome. The patient indicated having understood very clearly. We have given the patient no guarantees and we have made no promises. Enough time was given to the patient to ask questions, all of which were answered to the patient's satisfaction. Mr. Mctier has indicated that he wanted to continue with the procedure. Attestation: I, the ordering provider, attest that I have discussed with the patient the benefits, risks, side-effects, alternatives, likelihood of achieving goals, and potential problems during recovery for the procedure that I have provided informed consent. Date  Time: 03/09/2019 12:56 PM  Pre-Procedure Preparation:  Monitoring: As per clinic protocol. Respiration, ETCO2, SpO2, BP, heart rate and rhythm monitor placed and checked for adequate function Safety Precautions: Patient was assessed for positional comfort and pressure points before starting the procedure. Time-out: I initiated and conducted the "Time-out" before starting the procedure, as per protocol. The patient was asked to participate by confirming the accuracy of the "Time Out" information. Verification of the correct person, site, and procedure were performed and confirmed by me, the nursing staff, and the patient. "Time-out" conducted as per Joint Commission's Universal Protocol (UP.01.01.01). Time: 1318  Description of Procedure:          Laterality: Bilateral. The procedure was performed in identical fashion on both sides. Levels:  L2, L3, L4, L5, & S1 Medial Branch  Level(s) Area Prepped: Posterior Lumbosacral Region Prepping solution: DuraPrep (Iodine Povacrylex [0.7% available iodine] and Isopropyl Alcohol, 74% w/w) Safety Precautions: Aspiration looking for blood return was conducted prior to all injections. At no point did we inject any substances, as a needle was being advanced. Before injecting, the patient was told to immediately notify me if he was experiencing any new onset of "ringing in the ears, or metallic taste in the mouth". No attempts  were made at seeking any paresthesias. Safe injection practices and needle disposal techniques used. Medications properly checked for expiration dates. SDV (single dose vial) medications used. After the completion of the procedure, all disposable equipment used was discarded in the proper designated medical waste containers. Local Anesthesia: Protocol guidelines were followed. The patient was positioned over the fluoroscopy table. The area was prepped in the usual manner. The time-out was completed. The target area was identified using fluoroscopy. A 12-in long, straight, sterile hemostat was used with fluoroscopic guidance to locate the targets for each level blocked. Once located, the skin was marked with an approved surgical skin marker. Once all sites were marked, the skin (epidermis, dermis, and hypodermis), as well as deeper tissues (fat, connective tissue and muscle) were infiltrated with a small amount of a short-acting local anesthetic, loaded on a 10cc syringe with a 25G, 1.5-in  Needle. An appropriate amount of time was allowed for local anesthetics to take effect before proceeding to the next step. Local Anesthetic: Lidocaine 2.0% The unused portion of the local anesthetic was discarded in the proper designated containers. Technical explanation of process:  L2 Medial Branch Nerve Block (MBB): The target area for the L2 medial branch is at the junction of the postero-lateral aspect of the superior articular  process and the superior, posterior, and medial edge of the transverse process of L3. Under fluoroscopic guidance, a Quincke needle was inserted until contact was made with os over the superior postero-lateral aspect of the pedicular shadow (target area). After negative aspiration for blood, 0.5 mL of the nerve block solution was injected without difficulty or complication. The needle was removed intact. L3 Medial Branch Nerve Block (MBB): The target area for the L3 medial branch is at the junction of the postero-lateral aspect of the superior articular process and the superior, posterior, and medial edge of the transverse process of L4. Under fluoroscopic guidance, a Quincke needle was inserted until contact was made with os over the superior postero-lateral aspect of the pedicular shadow (target area). After negative aspiration for blood, 0.5 mL of the nerve block solution was injected without difficulty or complication. The needle was removed intact. L4 Medial Branch Nerve Block (MBB): The target area for the L4 medial branch is at the junction of the postero-lateral aspect of the superior articular process and the superior, posterior, and medial edge of the transverse process of L5. Under fluoroscopic guidance, a Quincke needle was inserted until contact was made with os over the superior postero-lateral aspect of the pedicular shadow (target area). After negative aspiration for blood, 0.5 mL of the nerve block solution was injected without difficulty or complication. The needle was removed intact. L5 Medial Branch Nerve Block (MBB): The target area for the L5 medial branch is at the junction of the postero-lateral aspect of the superior articular process and the superior, posterior, and medial edge of the sacral ala. Under fluoroscopic guidance, a Quincke needle was inserted until contact was made with os over the superior postero-lateral aspect of the pedicular shadow (target area). After negative  aspiration for blood, 0.5 mL of the nerve block solution was injected without difficulty or complication. The needle was removed intact. S1 Medial Branch Nerve Block (MBB): The target area for the S1 medial branch is at the posterior and inferior 6 o'clock position of the L5-S1 facet joint. Under fluoroscopic guidance, the Quincke needle inserted for the L5 MBB was redirected until contact was made with os over the inferior and postero aspect of  the sacrum, at the 6 o' clock position under the L5-S1 facet joint (Target area). After negative aspiration for blood, 0.5 mL of the nerve block solution was injected without difficulty or complication. The needle was removed intact.  Nerve block solution: 0.2% PF-Ropivacaine + Triamcinolone (40 mg/mL) diluted to a final concentration of 4 mg of Triamcinolone/mL of Ropivacaine The unused portion of the solution was discarded in the proper designated containers. Procedural Needles: 22-gauge, 3.5-inch, Quincke needles used for all levels.  Once the entire procedure was completed, the treated area was cleaned, making sure to leave some of the prepping solution back to take advantage of its long term bactericidal properties.   Illustration of the posterior view of the lumbar spine and the posterior neural structures. Laminae of L2 through S1 are labeled. DPRL5, dorsal primary ramus of L5; DPRS1, dorsal primary ramus of S1; DPR3, dorsal primary ramus of L3; FJ, facet (zygapophyseal) joint L3-L4; I, inferior articular process of L4; LB1, lateral branch of dorsal primary ramus of L1; IAB, inferior articular branches from L3 medial branch (supplies L4-L5 facet joint); IBP, intermediate branch plexus; MB3, medial branch of dorsal primary ramus of L3; NR3, third lumbar nerve root; S, superior articular process of L5; SAB, superior articular branches from L4 (supplies L4-5 facet joint also); TP3, transverse process of L3.  Vitals:   03/09/19 1330 03/09/19 1340 03/09/19 1350  03/09/19 1400  BP: 135/90 (!) 157/76 (!) 147/73 (!) 146/78  Pulse:      Resp: 18 17 18 18   Temp:  98.1 F (36.7 C)  98.3 F (36.8 C)  TempSrc:  Temporal    SpO2: 95% 96% 98% 98%  Weight:      Height:         Start Time: 1318 hrs. End Time: 1329 hrs.  Imaging Guidance (Spinal):          Type of Imaging Technique: Fluoroscopy Guidance (Spinal) Indication(s): Assistance in needle guidance and placement for procedures requiring needle placement in or near specific anatomical locations not easily accessible without such assistance. Exposure Time: Please see nurses notes. Contrast: None used. Fluoroscopic Guidance: I was personally present during the use of fluoroscopy. "Tunnel Vision Technique" used to obtain the best possible view of the target area. Parallax error corrected before commencing the procedure. "Direction-depth-direction" technique used to introduce the needle under continuous pulsed fluoroscopy. Once target was reached, antero-posterior, oblique, and lateral fluoroscopic projection used confirm needle placement in all planes. Images permanently stored in EMR. Interpretation: No contrast injected. I personally interpreted the imaging intraoperatively. Adequate needle placement confirmed in multiple planes. Permanent images saved into the patient's record.  Antibiotic Prophylaxis:   Anti-infectives (From admission, onward)   None     Indication(s): None identified  Post-operative Assessment:  Post-procedure Vital Signs:  Pulse/HCG Rate: 9698 Temp: 98.3 F (36.8 C) Resp: 18 BP: (!) 146/78 SpO2: 98 %  EBL: None  Complications: No immediate post-treatment complications observed by team, or reported by patient.  Note: The patient tolerated the entire procedure well. A repeat set of vitals were taken after the procedure and the patient was kept under observation following institutional policy, for this type of procedure. Post-procedural neurological assessment was  performed, showing return to baseline, prior to discharge. The patient was provided with post-procedure discharge instructions, including a section on how to identify potential problems. Should any problems arise concerning this procedure, the patient was given instructions to immediately contact us, at any time, without hesitation. In any case, we plan  to contact the patient by telephone for a follow-up status report regarding this interventional procedure.  Comments:  No additional relevant information.  Plan of Care  Orders:  Orders Placed This Encounter  Procedures  . LUMBAR FACET(MEDIAL BRANCH NERVE BLOCK) MBNB    Scheduling Instructions:     Procedure: Lumbar facet block (AKA.: Lumbosacral medial branch nerve block)     Side: Bilateral     Level: L3-4, L4-5, & L5-S1 Facets (L2, L3, L4, L5, & S1 Medial Branch Nerves)     Sedation: Patient's choice.     Timeframe: Today    Order Specific Question:   Where will this procedure be performed?    Answer:   [facility]  . [facility] C-ARM 1-60 MIN NO REPORT    Intraoperative interpretation by procedural physician at [place].    Standing Status:   Standing    Number of Occurrences:   1    Order Specific Question:   Reason for exam:    Answer:   Assistance in needle guidance and placement for procedures requiring needle placement in or near specific anatomical locations not easily accessible without such assistance.  . Informed Consent Details: Physician/Practitioner Attestation; Transcribe to consent form and obtain patient signature    Nursing Order: Transcribe to consent form and obtain patient signature. Note: Always confirm laterality of pain with Mr. Maloof, before procedure. Procedure: Lumbar Facet Block  under fluoroscopic guidance Indication/Reason: Low Back Pain, with our without leg pain, due to Facet Joint Arthralgia (Joint Pain) known as Lumbar Facet Syndrome, secondary to Lumbar, and/or Lumbosacral  Spondylosis (Arthritis of the Spine), without myelopathy or radiculopathy (Nerve Damage). Provider Attestation: I, [place] Dossie Arbour, MD, (Pain Management Specialist), the physician/practitioner, attest that I have discussed with the patient the benefits, risks, side effects, alternatives, likelihood of achieving goals and potential problems during recovery for the procedure that I have provided informed consent.  . Provide equipment / supplies at bedside    Equipment required: Single use, disposable, "Block Tray"    Standing Status:   Standing    Number of Occurrences:   1    Order Specific Question:   Specify    Answer:   Block Tray  . Miscellanous precautions    NOTE: Although It is true that patients can have allergies to shellfish and that shellfish contain iodine, most shellfish  allergies are due to two protein allergens present in the shellfish: tropomyosins and parvalbumin. Not all patients with shellfish allergies are allergic to iodine. However, as a precaution, avoid using iodine containing products.    Standing Status:   Standing    Number of Occurrences:   1   Chronic Opioid Analgesic:  No opioid analgesics prescribed by our practice. MME/day: 0 mg/day.   Medications ordered for procedure: Meds ordered this encounter  Medications  . lidocaine (XYLOCAINE) 2 % (with pres) injection 400 mg  . lactated ringers infusion 1,000 mL  . midazolam (VERSED) 5 MG/5ML injection 1-2 mg    Make sure Flumazenil is available in the pyxis when using this medication. If oversedation occurs, administer 0.2 mg IV over 15 sec. If after 45 sec no response, administer 0.2 mg again over 1 min; may repeat at 1 min intervals; not to exceed 4 doses (1 mg)  . fentaNYL (SUBLIMAZE) injection 25-50 mcg    Make sure Narcan is available in the pyxis when using this medication. In the event of respiratory depression (RR< 8/min): Titrate NARCAN (naloxone) in  increments of 0.1 to 0.2 mg IV at 2-3 minute  intervals, until desired degree of reversal.  . ropivacaine (PF) 2 mg/mL (0.2%) (NAROPIN) injection 18 mL  . triamcinolone acetonide (KENALOG-40) injection 80 mg   Medications administered: We administered lidocaine, lactated ringers, midazolam, fentaNYL, ropivacaine (PF) 2 mg/mL (0.2%), and triamcinolone acetonide.  See the medical record for exact dosing, route, and time of administration.  Follow-up plan:   Return in about 2 weeks (around 03/23/2019) for (VV), (PP).       Interventional management options:  Considerations:   NOTE:NO RFAuntil BMI is <35 Diagnostic bilateral L4 transforaminal LESI#1 Diagnostic (Midline) L3-4 LESI #1 Possible bilateral lumbar facetRFA   Palliative PRN treatment(s):   Diagnostic bilateral L4 transforaminal LESI #2 + Midline L3-4 interlaminar LESI #2  Diagnostic bilateral lumbar facet block#3under fluoroscopic guidance and IV sedation     Recent Visits Date Type Provider Dept  02/27/19 Office Visit Milinda Pointer, MD [facility]  Showing recent visits within past 90 days and meeting all other requirements   Today's Visits Date Type Provider Dept  03/09/19 Procedure visit Milinda Pointer, MD [facility]  Showing today's visits and meeting all other requirements   Future Appointments Date Type Provider Dept  03/22/19 Appointment Milinda Pointer, MD [facility]  Showing future appointments within next 90 days and meeting all other requirements   Disposition: Discharge home  Discharge (Date  Time): 03/09/2019; 1405 hrs.   Primary Care Physician: Theotis Burrow, MD Location: [facility] Note by: Gaspar Cola, MD Date: 03/09/2019; Time: 2:13 PM  Disclaimer:  Medicine is not an Chief Strategy Officer. The only guarantee in medicine is that nothing is guaranteed. It is important to note that the decision to proceed with this intervention was based on the  information collected from the patient. The Data and conclusions were drawn from the patient's questionnaire, the interview, and the physical examination. Because the information was provided in large part by the patient, it cannot be guaranteed that it has not been purposely or unconsciously manipulated. Every effort has been made to obtain as much relevant data as possible for this evaluation. It is important to note that the conclusions that lead to this procedure are derived in large part from the available data. Always take into account that the treatment will also be dependent on availability of resources and existing treatment guidelines, considered by other Pain Management Practitioners as being common knowledge and practice, at the time of the intervention. For Medico-Legal purposes, it is also important to point out that variation in procedural techniques and pharmacological choices are the acceptable norm. The indications, contraindications, technique, and results of the above procedure should only be interpreted and judged by a Board-Certified Interventional Pain Specialist with extensive familiarity and expertise in the same exact procedure and technique.

## 2019-03-09 NOTE — Progress Notes (Signed)
Safety precautions to be maintained throughout the outpatient stay will include: orient to surroundings, keep bed in low position, maintain call bell within reach at all times, provide assistance with transfer out of bed and ambulation.  

## 2019-03-09 NOTE — Patient Instructions (Signed)

## 2019-03-10 ENCOUNTER — Telehealth: Payer: Self-pay

## 2019-03-10 NOTE — Telephone Encounter (Signed)
Post procedure phone call.  LM 

## 2019-03-13 ENCOUNTER — Ambulatory Visit (INDEPENDENT_AMBULATORY_CARE_PROVIDER_SITE_OTHER): Payer: Medicare Other

## 2019-03-13 ENCOUNTER — Other Ambulatory Visit: Payer: Self-pay

## 2019-03-13 DIAGNOSIS — R0600 Dyspnea, unspecified: Secondary | ICD-10-CM

## 2019-03-13 DIAGNOSIS — R0609 Other forms of dyspnea: Secondary | ICD-10-CM

## 2019-03-13 MED ORDER — PERFLUTREN LIPID MICROSPHERE
1.0000 mL | INTRAVENOUS | Status: AC | PRN
  Administered 2019-03-13: 3 mL via INTRAVENOUS

## 2019-03-20 ENCOUNTER — Ambulatory Visit: Payer: Medicare Other | Admitting: Cardiology

## 2019-03-21 ENCOUNTER — Encounter: Payer: Self-pay | Admitting: Pain Medicine

## 2019-03-21 ENCOUNTER — Telehealth: Payer: Self-pay

## 2019-03-21 NOTE — Progress Notes (Signed)
Pain relief after procedure (treated area only): (Questions asked to patient) 1. Starting about 15 minutes after the procedure, and "while the area was still numb" (from the local anesthetics), were you having any of your usual pain "in that area" (the treated area)?  (NOTE: NOT including the discomfort from the needle sticks.) First 1 hour: 100 better. First 4-6 hours: 100 better. 2. How long did the numbness from the local anesthetics last? (More than 6 hours?) Duration: 8 hours.  3. How much better is your pain now, when compared to before the procedure? Current benefit: 100 better. 4. Can you move better now? Improvement in ROM (Range of Motion): YES 5. Can you do more now? Improvement in function: yes 4. Did you have any problems with the procedure? Side-effects/Complications: YES

## 2019-03-21 NOTE — Telephone Encounter (Signed)
LM for patient to call office for pre virtual appointment questions.  

## 2019-03-21 NOTE — Progress Notes (Signed)
Patient: [name]  Service Category: E/M  Provider: Gaspar Cola, MD  DOB: [birth year]  DOS: 03/22/2019  Location: Office  MRN: [id number]  Setting: Ambulatory outpatient  Referring Provider: Theotis Burrow*  Type: Established Patient  Specialty: Interventional Pain Management  PCP: Theotis Burrow, MD  Location: Remote location  Delivery: TeleHealth     Virtual Encounter - Pain Management PROVIDER NOTE: Information contained herein reflects review and annotations entered in association with encounter. Interpretation of such information and data should be left to medically-trained personnel. Information provided to patient can be located elsewhere in the medical record under "Patient Instructions". Document created using STT-dictation technology, any transcriptional errors that may result from process are unintentional.    Contact & Pharmacy Preferred: [phone] Home: [phone] (home) Mobile: [phone] (mobile) E-mail: [email or web address]  [facility] [place], [place] - [address] [address] Phone: [phone] Fax: [phone]  [facility] [address] [place] - [place] [address] Phone: [phone] Fax: [phone]   Pre-screening  Mr. Rowe offered "in-person" vs "virtual" encounter. He indicated preferring virtual for this encounter.   Reason COVID-19*  Social distancing based on CDC and AMA recommendations.   I contacted [name] on 03/22/2019 via telephone.      I clearly identified myself as Gaspar Cola, MD. I verified that I was speaking with the correct person using two identifiers (Name: VA BROADWELL, and date of birth: 02-20-1965).  Consent I sought verbal advanced consent from [name] for virtual visit interactions. I informed Mr. Portocarrero of possible security and privacy concerns, risks, and limitations  associated with providing "not-in-person" medical evaluation and management services. I also informed Mr. Gee of the availability of "in-person" appointments. Finally, I informed him that there would be a charge for the virtual visit and that he could be  personally, fully or partially, financially responsible for it. Mr. Russomanno expressed understanding and agreed to proceed.   Historic Elements   Mr. EUELL SCHIFF is a 54 y.o. year old, male patient evaluated today after his last contact with our practice on 03/21/2019. Mr. Furio  has a past medical history of COPD (chronic obstructive pulmonary disease) ([place]), Coronary artery disease, Diabetes mellitus without complication ([place]), Hyperlipidemia, Hypertension, Peptic ulcer, and Sleep apnea. He also  has a past surgical history that includes Esophagogastroduodenoscopy (egd) with propofol (N/A, 11/09/2016); Colonoscopy with propofol (N/A, 11/09/2016); Colonoscopy with propofol (N/A, 07/06/2017); and Esophagogastroduodenoscopy (egd) with propofol (N/A, 07/06/2017). Mr. Doverspike has a current medication list which includes the following prescription(s): albuterol, aspirin-acetaminophen-caffeine, atorvastatin, bupropion, fluticasone-salmeterol, trelegy ellipta, furosemide, gabapentin, hydrochlorothiazide, loratadine, losartan, magnesium, meloxicam, [START ON 05/20/2019] meloxicam, metformin, metoprolol tartrate, nicotine, nortriptyline, oxybutynin, pantoprazole, tizanidine, [START ON 05/20/2019] tizanidine, trueplus 5-bevel pen needles, victoza, and calcium carbonate. He  reports that he has been smoking cigarettes. He has a 30.00 pack-year smoking history. He has never used smokeless tobacco. He reports that he does not drink alcohol or use drugs. Mr. Knippel is allergic to shellfish allergy; codeine; and lisinopril.   HPI  Today, he is being contacted for a post-procedure assessment.  The patient indicates having done extremely well with the bilateral lumbar facet  block to the point where right now he is not having any pain.  Today we talked about having him lose some weight so that he can bring his BMI down to the point where hopefully we can offer him  some radiofrequency ablation.  Not only that, but eliminating that weight will probably slow down the degeneration of the lumbar facet joints and avoid problems such as facet hypertrophy which in turn could end up causing foraminal, lateral recess, or central spinal stenosis.  He understood and accepted.  In addition, I have entered some as needed orders for this procedure to be repeated, in the event that it worsens again.  Post-Procedure Evaluation  Procedure (03/09/2019): Palliative bilateral lumbar facet block #3 under fluoroscopic guidance and IV sedation Pre-procedure pain level:  4/10 Post-procedure: 0/10 (100% relief)  Sedation: Sedation provided.  Chauncey Fischer, RN  03/21/2019 10:23 AM  Sign when Signing Visit Pain relief after procedure (treated area only): (Questions asked to patient) 1. Starting about 15 minutes after the procedure, and "while the area was still numb" (from the local anesthetics), were you having any of your usual pain "in that area" (the treated area)?  (NOTE: NOT including the discomfort from the needle sticks.) First 1 hour: 100 better. First 4-6 hours: 100 better. 2. How long did the numbness from the local anesthetics last? (More than 6 hours?) Duration: 8 hours.  3. How much better is your pain now, when compared to before the procedure? Current benefit: 100 better. 4. Can you move better now? Improvement in ROM (Range of Motion): YES 5. Can you do more now? Improvement in function: yes 4. Did you have any problems with the procedure? Side-effects/Complications: YES  Current benefits: Defined as benefit that persist at this time.   Analgesia:  90-100% better Function: Mr. Maines reports improvement in function ROM: Mr. Hymon reports improvement in  ROM  Pharmacotherapy Assessment  Analgesic: No opioid analgesics prescribed by our practice. MME/day: 0 mg/day.   Monitoring: [place] PMP: PDMP reviewed during this encounter.       Pharmacotherapy: No side-effects or adverse reactions reported. Compliance: No problems identified. Effectiveness: Clinically acceptable. Plan: Refer to "POC".  UDS:  Summary  Date Value Ref Range Status  10/07/2017 FINAL  Final    Comment:    ==================================================================== TOXASSURE COMP DRUG ANALYSIS,UR ==================================================================== Test                             Result       Flag       Units Drug Present and Declared for Prescription Verification   Bupropion                      PRESENT      EXPECTED   Hydroxybupropion               PRESENT      EXPECTED    Hydroxybupropion is an expected metabolite of bupropion. Drug Present not Declared for Prescription Verification   Acetaminophen                  PRESENT      UNEXPECTED ==================================================================== Test                      Result    Flag   Units      Ref Range   Creatinine              157              mg/dL      >=20 ==================================================================== Declared Medications:  The flagging and interpretation on this report are based on the  following declared medications.  Unexpected results may arise from  inaccuracies in the declared medications.  **Note: The testing scope of this panel includes these medications:  Bupropion  **Note: The testing scope of this panel does not include following  reported medications:  Albuterol  Atorvastatin  Fluticasone (Advair)  Furosemide (Lasix)  Hydrochlorothiazide (Microzide)  Loratadine (Claritin)  Losartan (Cozaar)  Metformin  Oxybutynin (Ditropan)  Pantoprazole (Protonix)  Salmeterol (Advair)  Tiotropium  (Spiriva) ==================================================================== For clinical consultation, please call [phone]. ====================================================================    Laboratory Chemistry Profile   Renal Lab Results  Component Value Date   BUN 14 06/30/2018   CREATININE 1.27 (H) 06/30/2018   BCR 9 10/07/2017   GFRAA >60 06/30/2018   GFRNONAA >60 06/30/2018    Hepatic Lab Results  Component Value Date   AST 21 10/15/2017   ALT 23 10/15/2017   ALBUMIN 4.0 10/15/2017   ALKPHOS 85 10/15/2017    Electrolytes Lab Results  Component Value Date   NA 142 06/30/2018   K 3.7 06/30/2018   CL 103 06/30/2018   CALCIUM 8.8 (L) 06/30/2018   MG 2.0 06/29/2018    Bone Lab Results  Component Value Date   25OHVITD1 29 (L) 10/07/2017   25OHVITD2 <1.0 10/07/2017   25OHVITD3 29 10/07/2017    Inflammation (CRP: Acute Phase) (ESR: Chronic Phase) Lab Results  Component Value Date   CRP 5 10/07/2017   ESRSEDRATE 23 10/07/2017   LATICACIDVEN 2.7 ([place]) 06/27/2018      Note: Above Lab results reviewed.  Imaging  ECHOCARDIOGRAM COMPLETE    ECHOCARDIOGRAM REPORT       Patient Name:   OTIS PORTAL Date of Exam: 03/13/2019 Medical Rec #:  [id number]        Height:       69.0 in Accession #:    [id number]       Weight:       312.0 lb Date of Birth:  April 19, 1965        BSA:          2.496 m Patient Age:    54 years         BP:           114/75 mmHg Patient Gender: M                HR:           90 bpm. Exam Location:  [facility]  Procedure: 2D Echo, Intracardiac Opacification Agent, Cardiac Doppler and Color            Doppler  Indications:    R06.02 SOB,   History:        Patient has prior history of Echocardiogram examinations, most                 recent 01/09/2016. CAD, COPD, Signs/Symptoms:Shortness of Breath                 and Dyspnea; Risk Factors:Current Smoker, Sleep Apnea and                 Hypertension. Patient complains of  increasing SOB w/ DOE.   Sonographer:    Salvadore Dom RVT, RDCS (AE), RDMS Referring Phys: [id number] [facility]    Sonographer Comments: Patient is morbidly obese, suboptimal parasternal window, suboptimal apical window and suboptimal subcostal window. IMPRESSIONS   1. Left ventricular ejection fraction, by estimation, is 60 to 65%. The left ventricle has normal function. The left ventricle has no regional wall  motion abnormalities. There is could not measure left ventricular hypertrophy. Left ventricular diastolic  parameters were normal.  2. Right ventricular systolic function is normal. The right ventricular size is normal.  3. The mitral valve is normal in structure and function. No evidence of mitral valve regurgitation. No evidence of mitral stenosis.  4. The aortic valve is grossly normal. Aortic valve regurgitation is not visualized. No aortic stenosis is present.  5. The inferior vena cava is normal in size with greater than 50% respiratory variability, suggesting right atrial pressure of 3 mmHg.  Comparison(s): EF 60%.  FINDINGS  Left Ventricle: Left ventricular ejection fraction, by estimation, is 60 to 65%. The left ventricle has normal function. The left ventricle has no regional wall motion abnormalities. Definity contrast agent was given IV to delineate the left ventricular  endocardial borders. The left ventricular internal cavity size was normal in size. There is could not measure left ventricular hypertrophy. Left ventricular diastolic parameters were normal.  Right Ventricle: The right ventricular size is normal. No increase in right ventricular wall thickness. Right ventricular systolic function is normal.  Left Atrium: Left atrial size was normal in size.  Right Atrium: Right atrial size was normal in size.  Pericardium: There is no evidence of pericardial effusion.  Mitral Valve: The mitral valve is normal in structure and function. Normal mobility of the  mitral valve leaflets. No evidence of mitral valve regurgitation. No evidence of mitral valve stenosis.  Tricuspid Valve: The tricuspid valve is normal in structure. Tricuspid valve regurgitation is not demonstrated. No evidence of tricuspid stenosis.  Aortic Valve: The aortic valve is grossly normal. Aortic valve regurgitation is not visualized. No aortic stenosis is present.  Pulmonic Valve: The pulmonic valve was not well visualized. Pulmonic valve regurgitation is not visualized. No evidence of pulmonic stenosis.  Aorta: The aortic root is normal in size and structure.  Venous: The inferior vena cava is normal in size with greater than 50% respiratory variability, suggesting right atrial pressure of 3 mmHg.  IAS/Shunts: No atrial level shunt detected by color flow Doppler.    LEFT VENTRICLE PLAX 2D LVOT diam:     2.00 cm  Diastology LV SV:         63       LV e' lateral:   9.07 cm/s LV SV Index:   25       LV E/e' lateral: 9.0 LVOT Area:     3.14 cm LV e' medial:    7.12 cm/s                         LV E/e' medial:  11.4    LEFT ATRIUM     Index LA Vol (A2C):   13.60 ml/m LA Vol (A4C):   18.00 ml/m LA Biplane Vol: 16.40 ml/m  AORTIC VALVE LVOT Vmax:   138.00 cm/s LVOT Vmean:  82.400 cm/s LVOT VTI:    0.202 m   AORTA Ao Root diam: 3.00 cm Ao Asc diam:  3.40 cm  MITRAL VALVE               TRICUSPID VALVE MV Area (PHT): 5.02 cm    TV Peak grad:   5.3 mmHg MV Decel Time: 151 msec    TV Vmax:        1.15 m/s MV E velocity: 81.40 cm/s MV A velocity: 65.20 cm/s  SHUNTS MV E/A ratio:  1.25  Systemic VTI:  0.20 m                            Systemic Diam: 2.00 cm  Kate Sable MD Electronically signed by Kate Sable MD Signature Date/Time: 03/13/2019/4:38:07 PM      Final    Assessment  The primary encounter diagnosis was Chronic pain syndrome. Diagnoses of Chronic low back pain (Bilateral) w/o sciatica, Lumbar facet syndrome (Bilateral) (R>L),  Chronic lower extremity pain (Secondary Area of Pain) (Bilateral) (R>L), and Lumbar facet hypertrophy (Bilateral) were also pertinent to this visit.  Plan of Care  Problem-specific:  No problem-specific Assessment & Plan notes found for this encounter.  Mr. JAQUAVIS FELMLEE has a current medication list which includes the following long-term medication(s): albuterol, atorvastatin, fluticasone-salmeterol, furosemide, gabapentin, loratadine, losartan, magnesium, meloxicam, [START ON 05/20/2019] meloxicam, metformin, metoprolol tartrate, nortriptyline, tizanidine, [START ON 05/20/2019] tizanidine, victoza, and calcium carbonate.  Pharmacotherapy (Medications Ordered): No orders of the defined types were placed in this encounter.  Orders:  Orders Placed This Encounter  Procedures  . LUMBAR FACET(MEDIAL BRANCH NERVE BLOCK) MBNB    Scheduling timeframe: (PRN procedure) Mr. Labell will call when needed. Clinical indication: Axial low back pain. 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  Sedation: Usually done with sedation. (May be done without sedation if so desired by patient.) Requirements: NPO x 8 hrs.; Driver; Stop blood thinners. Interval: No sooner than two weeks for diagnostic or therapeutic. No sooner than every other month for palliative.    Standing Status:   Standing    Number of Occurrences:   6    Standing Expiration Date:   09/21/2020    Scheduling Instructions:     Procedure: Lumbar facet block (AKA.: Lumbosacral medial branch nerve block)     Level: L3-4, L4-5, & L5-S1 Facets (L2, L3, L4, L5, & S1 Medial Branch Nerves)     Laterality: Bilateral    Order Specific Question:   Where will this procedure be performed?    Answer:   [facility]   Follow-up plan:   Return in about 34 weeks (around 11/15/2019) for (VV), (MM), in addition, PRN Procedure(s): (B) L-FCT BLK #4, (w/ Sedation).      Interventional management options:  Considerations:   NOTE:NO RFAuntil BMI is  <35 Diagnostic bilateral L4 transforaminal LESI#1 Diagnostic (Midline) L3-4 LESI #1 Possible bilateral lumbar facetRFA (No RFA until BMI<35)   Palliative PRN treatment(s):   Diagnostic bilateral L4 TFLESI #2 + Midline L3-4 LESI #2  Diagnostic bilateral lumbar facet block#4under fluoroscopic guidance and IV sedation     Recent Visits Date Type Provider Dept  03/09/19 Procedure visit Milinda Pointer, MD [facility]  02/27/19 Office Visit Milinda Pointer, MD [facility]  Showing recent visits within past 90 days and meeting all other requirements   Today's Visits Date Type Provider Dept  03/22/19 Telemedicine Milinda Pointer, MD [facility]  Showing today's visits and meeting all other requirements   Future Appointments No visits were found meeting these conditions.  Showing future appointments within next 90 days and meeting all other requirements   I discussed the assessment and treatment plan with the patient. The patient was provided an opportunity to ask questions and all were answered. The patient agreed with the plan and demonstrated an understanding of the instructions.  Patient advised to call back or seek an in-person evaluation if the symptoms or condition worsens.  Duration of encounter: 15 minutes.  Note  by: Gaspar Cola, MD Date: 03/22/2019; Time: 2:56 PM

## 2019-03-22 ENCOUNTER — Ambulatory Visit: Payer: Medicare Other | Attending: Pain Medicine | Admitting: Pain Medicine

## 2019-03-22 ENCOUNTER — Other Ambulatory Visit: Payer: Self-pay

## 2019-03-22 VITALS — Ht 69.0 in | Wt 310.0 lb

## 2019-03-22 DIAGNOSIS — G8929 Other chronic pain: Secondary | ICD-10-CM

## 2019-03-22 DIAGNOSIS — M79604 Pain in right leg: Secondary | ICD-10-CM | POA: Diagnosis not present

## 2019-03-22 DIAGNOSIS — G894 Chronic pain syndrome: Secondary | ICD-10-CM

## 2019-03-22 DIAGNOSIS — M47816 Spondylosis without myelopathy or radiculopathy, lumbar region: Secondary | ICD-10-CM

## 2019-03-22 DIAGNOSIS — M545 Low back pain, unspecified: Secondary | ICD-10-CM

## 2019-03-22 DIAGNOSIS — M79605 Pain in left leg: Secondary | ICD-10-CM

## 2019-03-22 NOTE — Patient Instructions (Addendum)
______________________________________________________________________________________________  Weight Management Required  URGENT: Your weight has been found to be adversely affecting your health.  Dear [name]:  Your current Estimated body mass index is 45.78 kg/m as calculated from the following:   Height as of this encounter: 5' 9"  (1.753 m).   Weight as of this encounter: 310 lb (140.6 kg).  Please use the table below to identify your weight category and associated incidence of chronic pain, secondary to your weight.  Body Mass Index (BMI) Classification BMI level (kg/m2) Category Associated incidence of chronic pain  <18  Underweight   18.5-24.9 Ideal body weight   25-29.9 Overweight  20%  30-34.9 Obese (Class I)  68%  35-39.9 Severe obesity (Class II)  136%  >40 Extreme obesity (Class III)  254%   In addition: You will be considered "Morbidly Obese", if your BMI is above 30 and you have one or more of the following conditions which are known to be caused and/or directly associated with obesity: 1.    Type 2 Diabetes (Which in turn can lead to cardiovascular diseases (CVD), stroke, peripheral vascular diseases (PVD), retinopathy, nephropathy, and neuropathy) 2.    Cardiovascular Disease (High Blood Pressure; Congestive Heart Failure; High Cholesterol; Coronary Artery Disease; Angina; or History of Heart Attacks) 3.    Breathing problems (Asthma; obesity-hypoventilation syndrome; obstructive sleep apnea; chronic inflammatory airway disease; reactive airway disease; or shortness of breath) 4.    Chronic kidney disease 5.    Liver disease (nonalcoholic fatty liver disease) 6.    High blood pressure 7.    Acid reflux (gastroesophageal reflux disease; heartburn) 8.    Osteoarthritis (OA) (with any of the following: hip pain; knee pain; and/or low back pain) 9.    Low back pain (Lumbar Facet Syndrome; and/or Degenerative Disc Disease) 10.  Hip pain (Osteoarthritis of hip) (For  every 1 lbs of added body weight, there is a 2 lbs increase in pressure inside of each hip articulation. 1:2 mechanical relationship) 11.  Knee pain (Osteoarthritis of knee) (For every 1 lbs of added body weight, there is a 4 lbs increase in pressure inside of each knee articulation. 1:4 mechanical relationship) (patients with a BMI>30 kg/m2 were 6.8 times more likely to develop knee OA than normal-weight individuals) 12.  Cancer: Epidemiological studies have shown that obesity is a risk factor for: post-menopausal breast cancer; cancers of the endometrium, colon and kidney cancer; malignant adenomas of the oesophagus. Obese subjects have an approximately 1.5-3.5-fold increased risk of developing these cancers compared with normal-weight subjects, and it has been estimated that between 15 and 45% of these cancers can be attributed to overweight. More recent studies suggest that obesity may also increase the risk of other types of cancer, including pancreatic, hepatic and gallbladder cancer. (Ref: Obesity and cancer. Pischon T, Nthlings U, [employer] H. Proc Nutr Soc. 2008 May;67(2):128-45. doi: 37.8588/F0277412878676720.) The International Agency for Research on Cancer (IARC) has identified 13 cancers associated with overweight and obesity: meningioma, multiple myeloma, adenocarcinoma of the esophagus, and cancers of the thyroid, postmenopausal breast cancer, gallbladder, stomach, liver, pancreas, kidney, ovaries, uterus, colon and rectal (colorectal) cancers. 34 percent of all cancers diagnosed in women and 24 percent of those diagnosed in men are associated with overweight and obesity.  Recommendation: At this point it is urgent that you take a step back and concentrate in loosing weight. Dedicate 100% of your efforts on this task. Nothing else will improve your health more than bringing your weight down and your BMI  to less than 30. If you are here, you probably have chronic pain. Because most chronic pain  patients have difficulty exercising secondary to their pain, you must rely on proper nutrition and diet in order to lose the weight. If your BMI is above 40, you should seriously consider bariatric surgery. A realistic goal is to lose 10% of your body weight over a period of 12 months.  Be honest to yourself, if over time you have unsuccessfully tried to lose weight, then it is time for you to seek professional help and to enter a medically supervised weight management program, and/or undergo bariatric surgery. Stop procrastinating.   Pain management considerations:  1.    Pharmacological Problems: Be advised that the use of opioid analgesics (oxycodone; hydrocodone; morphine; methadone; codeine; and all of their derivatives) have been associated with decreased metabolism and weight gain.  For this reason, should we see that you are unable to lose weight while taking these medications, it may become necessary for [place] to taper down and indefinitely discontinue them.  2.    Technical Problems: The incidence of successful interventional therapies decreases as the patient's BMI increases. It is much more difficult to accomplish a safe and effective interventional therapy on a patient with a BMI above 35. 3.    Radiation Exposure Problems: The x-rays machine, used to accomplish injection therapies, will automatically increase their x-ray output in order to capture an appropriate bone image. This means that radiation exposure increases exponentially with the patient's BMI. (The higher the BMI, the higher the radiation exposure.) Although the level of radiation used at a given time is still safe to the patient, it is not for the physician and/or assisting staff. Unfortunately, radiation exposure is accumulative. Because physicians and the staff have to do procedures and be exposed on a daily basis, this can result in health problems such as cancer and radiation burns. Radiation exposure to the staff is monitored by the  radiation batches that they wear. The exposure levels are reported back to the staff on a quarterly basis. Depending on levels of exposure, physicians and staff may be obligated by law to decrease this exposure. This means that they have the right and obligation to refuse providing therapies where they may be overexposed to radiation. For this reason, physicians may decline to offer therapies such as radiofrequency ablation or implants to patients with a BMI above 40. 4.    Current Trends: Be advised that the current trend is to no longer offer certain therapies to patients with a BMI equal to, or above 35, due to increase perioperative risks, increased technical procedural difficulties, and excessive radiation exposure to healthcare personnel.  ______________________________________________________________________________________________    ____________________________________________________________________________________________  Pain Management Weight Control Diet   Note: Before starting this diet, make sure to talk to your primary care physician to be sure it is safe for you.   BMI:  Your current Estimated body mass index is 45.78 kg/m as calculated from the following:   Height as of this encounter: 5' 9"  (1.753 m).   Weight as of this encounter: 310 lb (140.6 kg).  Breakfast:   Drink 12 oz of cold water 15-30 minutes prior to meal  1 boiled or pouched egg. You may use the egg white ready-made preparations.  1 wheat low calorie toast.   8 oz. black coffee without milk, creamer, or any type of sweetener. (If a sweetener must be used, then use stevia).   Lunch & Dinner:   Drink  12-24 oz. of ice water, 30 minutes prior to meal  5 oz. Lean Protein like chicken, fish, or lean meat (above 95% fat free).   No cold cuts or processed meats. (No red meat)  Steamed, baked or grilled. (Never fried).  No oils, fats, lard, or butter.   1 cup of steamed vegetables, or 1.5 cups if raw.    1 serving of salad or greens (No dressings, except vinegar or lemon juice).   Mid-day & Mid-afternoon snack:   1 fruit. No bananas. (maximum of 2 fruits per day)   Important Rules:  1. Drink water. Must drink 100 oz. or more of water per day.   Consult your Primary Care Physician if you have a history of kidney failure, or congestive heart failure before doing this.  2. Take calcium and magnesium every day to avoid night cramps.   Consult your Primary Care Physician if you have a history of kidney failure, hypercalcemia, or parathyroid problems before doing this.   Over-the-counter calcium 1200 mg/day (in the morning). Take with Vitamin D 5000 IU every day.   Over-the-counter magnesium 400 to 500 mg per day (1-2 hours prior to bedtime).  3. Avoid salt. Never add any salt and avoid salty foods. Do not take more than 2,300 mg/day. 4. Avoid eating anything after 6:00 pm.  5. Weight yourself every morning at the same time and record weight on a notebook.  6. Mix "Benefiber" 3 to 5 table spoons in water and drink before each meals.  7. No sodas.  8. No alcohol.  9. No sugar.  10. No artificial sweeteners.   Stevia without sugar is the only sweetener aloud.  11. No bread except for the one breakfast toast.   Low calorie wheat bread.  12. Duration of diet: 2 weeks at a time with 2 days' rest, then repeat, until BMI is less than 30.  13. Restrict food volume. When feeding restrict the total volume of food that you eat at a meal. Never eat more than what you can hold in the palm of one hand. 14. Eat very slow. Chew food twenty (20) times before swallowing. 15. Restrict amount of times that you eat. Do not feed any sooner than every 4 hours. 16. Do not over-eat or over-indulge yourself in the 2 days of rest from the diet.  17. Follow ancient [place] customs. Have your meals sitting on the floor.  You will notice that your stomach will be creating an increased abdominal pressure that  will not allow you to overeat. (This last one may be difficult or impossible for most chronic pain patients).     ____________________________________________________________________________________________   ____________________________________________________________________________________________  Preparing for Procedure with Sedation  Procedure appointments are limited to planned procedures: . No Prescription Refills. . No disability issues will be discussed. . No medication changes will be discussed.  Instructions: . Oral Intake: Do not eat or drink anything for at least 8 hours prior to your procedure. . Transportation: Public transportation is not allowed. Bring an adult driver. The driver must be physically present in our waiting room before any procedure can be started. Marland Kitchen Physical Assistance: Bring an adult physically capable of assisting you, in the event you need help. This adult should keep you company at home for at least 6 hours after the procedure. . Blood Pressure Medicine: Take your blood pressure medicine with a sip of water the morning of the procedure. . Blood thinners: Notify our staff if you are taking any  blood thinners. Depending on which one you take, there will be specific instructions on how and when to stop it. . Diabetics on insulin: Notify the staff so that you can be scheduled 1st case in the morning. If your diabetes requires high dose insulin, take only  of your normal insulin dose the morning of the procedure and notify the staff that you have done so. . Preventing infections: Shower with an antibacterial soap the morning of your procedure. . Build-up your immune system: Take 1000 mg of Vitamin C with every meal (3 times a day) the day prior to your procedure. Marland Kitchen Antibiotics: Inform the staff if you have a condition or reason that requires you to take antibiotics before dental procedures. . Pregnancy: If you are pregnant, call and cancel the  procedure. . Sickness: If you have a cold, fever, or any active infections, call and cancel the procedure. . Arrival: You must be in the facility at least 30 minutes prior to your scheduled procedure. . Children: Do not bring children with you. . Dress appropriately: Bring dark clothing that you would not mind if they get stained. . Valuables: Do not bring any jewelry or valuables.  Reasons to call and reschedule or cancel your procedure: (Following these recommendations will minimize the risk of a serious complication.) . Surgeries: Avoid having procedures within 2 weeks of any surgery. (Avoid for 2 weeks before or after any surgery). . Flu Shots: Avoid having procedures within 2 weeks of a flu shots or . (Avoid for 2 weeks before or after immunizations). . Barium: Avoid having a procedure within 7-10 days after having had a radiological study involving the use of radiological contrast. (Myelograms, Barium swallow or enema study). . Heart attacks: Avoid any elective procedures or surgeries for the initial 6 months after a "Myocardial Infarction" (Heart Attack). . Blood thinners: It is imperative that you stop these medications before procedures. Let us know if you if you take any blood thinner.  . Infection: Avoid procedures during or within two weeks of an infection (including chest colds or gastrointestinal problems). Symptoms associated with infections include: Localized redness, fever, chills, night sweats or profuse sweating, burning sensation when voiding, cough, congestion, stuffiness, runny nose, sore throat, diarrhea, nausea, vomiting, cold or Flu symptoms, recent or current infections. It is specially important if the infection is over the area that we intend to treat. Marland Kitchen Heart and lung problems: Symptoms that may suggest an active cardiopulmonary problem include: cough, chest pain, breathing difficulties or shortness of breath, dizziness, ankle swelling, uncontrolled high or unusually low  blood pressure, and/or palpitations. If you are experiencing any of these symptoms, cancel your procedure and contact your primary care physician for an evaluation.  Remember:  Regular Business hours are:  Monday to Thursday 8:00 AM to 4:00 PM  Provider's Schedule: Milinda Pointer, MD:  Procedure days: Tuesday and Thursday 7:30 AM to 4:00 PM  Gillis Santa, MD:  Procedure days: Monday and Wednesday 7:30 AM to 4:00 PM ____________________________________________________________________________________________

## 2019-03-23 ENCOUNTER — Other Ambulatory Visit
Admission: RE | Admit: 2019-03-23 | Discharge: 2019-03-23 | Disposition: A | Discharge status: Home / Self Care | Payer: Medicare Other | Source: Ambulatory Visit | Attending: Cardiology | Admitting: Cardiology

## 2019-03-23 DIAGNOSIS — I10 Essential (primary) hypertension: Secondary | ICD-10-CM | POA: Insufficient documentation

## 2019-03-23 DIAGNOSIS — R0600 Dyspnea, unspecified: Secondary | ICD-10-CM | POA: Diagnosis present

## 2019-03-23 DIAGNOSIS — R0609 Other forms of dyspnea: Secondary | ICD-10-CM

## 2019-03-23 LAB — BASIC METABOLIC PANEL
Anion gap: 9 (ref 5–15)
BUN: 17 mg/dL (ref 6–20)
CO2: 23 mmol/L (ref 22–32)
Calcium: 9.3 mg/dL (ref 8.9–10.3)
Chloride: 102 mmol/L (ref 98–111)
Creatinine, Ser: 1.16 mg/dL (ref 0.61–1.24)
GFR calc Af Amer: >60 mL/min (ref >60)
GFR calc non Af Amer: >60 mL/min (ref >60)
Glucose, Bld: 128 mg/dL — ABNORMAL HIGH (ref 70–99)
Potassium: 3.8 mmol/L (ref 3.5–5.1)
Sodium: 134 mmol/L — ABNORMAL LOW (ref 135–145)

## 2019-03-27 ENCOUNTER — Encounter (HOSPITAL_COMMUNITY): Payer: Self-pay

## 2019-03-27 ENCOUNTER — Telehealth (HOSPITAL_COMMUNITY): Payer: Self-pay | Admitting: Emergency Medicine

## 2019-03-27 NOTE — Telephone Encounter (Signed)
Left message on voicemail with name and callback number Alizia Greif RN Navigator Cardiac Imaging [facility] [phone] Office [phone] Cell  

## 2019-03-28 ENCOUNTER — Other Ambulatory Visit: Payer: Self-pay

## 2019-03-28 ENCOUNTER — Ambulatory Visit (HOSPITAL_COMMUNITY)
Admission: RE | Admit: 2019-03-28 | Discharge: 2019-03-28 | Disposition: A | Discharge status: Home / Self Care | Payer: Medicare Other | Source: Ambulatory Visit | Attending: Cardiology | Admitting: Cardiology

## 2019-03-28 ENCOUNTER — Encounter (HOSPITAL_COMMUNITY): Payer: Self-pay

## 2019-03-28 DIAGNOSIS — I209 Angina pectoris, unspecified: Secondary | ICD-10-CM

## 2019-03-28 NOTE — Progress Notes (Signed)
Patient at radiology for CT heart exam. Patient took 100 mg of Lopressor 2 hours prior to scan as directed. HR 95-105, BP 138/93. Dr. Debara Pickett called and informed of vital signs and further guidance for heart rate control for exam. Physician would like to cancel CT heart exam and reschedule with different medication for better heart rate control. CT heart navigator nurse, Marchia Bond informed of cancellation. Patient informed that exam will need to be rescheduled because heart rate is too elevated for scan. Patient states understanding of cancellation.

## 2019-03-31 ENCOUNTER — Ambulatory Visit (INDEPENDENT_AMBULATORY_CARE_PROVIDER_SITE_OTHER): Payer: Medicare Other | Admitting: Cardiology

## 2019-03-31 ENCOUNTER — Encounter: Payer: Self-pay | Admitting: Cardiology

## 2019-03-31 ENCOUNTER — Other Ambulatory Visit: Payer: Self-pay

## 2019-03-31 VITALS — BP 132/80 | HR 97 | Ht 69.0 in | Wt 311.4 lb

## 2019-03-31 DIAGNOSIS — F17200 Nicotine dependence, unspecified, uncomplicated: Secondary | ICD-10-CM

## 2019-03-31 DIAGNOSIS — R0600 Dyspnea, unspecified: Secondary | ICD-10-CM | POA: Diagnosis not present

## 2019-03-31 DIAGNOSIS — R0609 Other forms of dyspnea: Secondary | ICD-10-CM

## 2019-03-31 DIAGNOSIS — I10 Essential (primary) hypertension: Secondary | ICD-10-CM

## 2019-03-31 NOTE — Patient Instructions (Addendum)
Medication Instructions:  Your physician recommends that you continue on your current medications as directed. Please refer to the Current Medication list given to you today.  *If you need a refill on your cardiac medications before your next appointment, please call your pharmacy*   Lab Work: None ordered If you have labs (blood work) drawn today and your tests are completely normal, you will receive your results only by: Marland Kitchen MyChart Message (if you have MyChart) OR . A paper copy in the mail If you have any lab test that is abnormal or we need to change your treatment, we will call you to review the results.   Testing/Procedures: Your physician has requested that you have a lexiscan myoview. For further information please visit [email or web address]. Please follow instruction sheet, as given.     Follow-Up: At [facility], you and your health needs are our priority.  As part of our continuing mission to provide you with exceptional heart care, we have created designated Provider Care Teams.  These Care Teams include your primary Cardiologist (physician) and Advanced Practice Providers (APPs -  Physician Assistants and Nurse Practitioners) who all work together to provide you with the care you need, when you need it.  We recommend signing up for the patient portal called "MyChart".  Sign up information is provided on this After Visit Summary.  MyChart is used to connect with patients for Virtual Visits (Telemedicine).  Patients are able to view lab/test results, encounter notes, upcoming appointments, etc.  Non-urgent messages can be sent to your provider as well.   To learn more about what you can do with MyChart, go to [email or web address].    Your next appointment:   After testing    The format for your next appointment:   In Person  Provider:    You may see Dr. Garen Lah or one of the following Advanced Practice Providers on your designated Care Team:    Murray Hodgkins, NP  Christell Faith, PA-C  Marrianne Mood, PA-C    Other Instructions [place]  Your caregiver has ordered a Stress Test with nuclear imaging. The purpose of this test is to evaluate the blood supply to your heart muscle. This procedure is referred to as a "Non-Invasive Stress Test." This is because other than having an IV started in your vein, nothing is inserted or "invades" your body. Cardiac stress tests are done to find areas of poor blood flow to the heart by determining the extent of coronary artery disease (CAD). Some patients exercise on a treadmill, which naturally increases the blood flow to your heart, while others who are  unable to walk on a treadmill due to physical limitations have a pharmacologic/chemical stress agent called Lexiscan . This medicine will mimic walking on a treadmill by temporarily increasing your coronary blood flow.   Please note: these test may take anywhere between 2-4 hours to complete  PLEASE REPORT TO [place] AT THE FIRST DESK WILL DIRECT YOU WHERE TO GO  Date of Procedure:_____________________________________  Arrival Time for Procedure:______________________________  Instructions regarding medication:   __X__ : Hold diabetes medication morning of procedure    __X__:  Hold other medications as follows:__Furosemide and HCTZ the morning of the test   PLEASE NOTIFY THE OFFICE AT LEAST 24 HOURS IN ADVANCE IF YOU ARE UNABLE TO KEEP YOUR APPOINTMENT.  [phone] AND  PLEASE NOTIFY NUCLEAR MEDICINE AT [facility] AT LEAST 24 HOURS IN ADVANCE IF YOU ARE UNABLE TO KEEP YOUR  APPOINTMENT. [phone]  How to prepare for your Myoview test:  1. Do not eat or drink after midnight 2. No caffeine for 24 hours prior to test 3. No smoking 24 hours prior to test. 4. Your medication may be taken with water.  If your doctor stopped a medication because of this test, do not take that medication. 5. Ladies, please do not  wear dresses.  Skirts or pants are appropriate. Please wear a short sleeve shirt. 6. No perfume, cologne or lotion. 7. Wear comfortable walking shoes. No heels!

## 2019-03-31 NOTE — Progress Notes (Signed)
Cardiology Office Note:    Date:  03/31/2019   ID:  [name], DOB 31-Jul-1965, MRN [id number]  PCP:  Theotis Burrow, MD  Cardiologist:  No primary care provider on file.  Electrophysiologist:  None   Referring MD: Theotis Burrow*   Chief Complaint  Patient presents with   office visit    Pt has no complaints, expect trouble sleeping states having nightmares keep waking up at night. (heart rate needs to come down for CT).  Meds verbally reviewed w/ pt.    History of Present Illness:    [name] is a 54 y.o. male with a hx of COPD, current smoker x40+ years, sleep apnea, diabetes, hypertension, hyperlipidemia who presents for follow-up.  He was last seen due to shortness of breath.  Patient states having worsening shortness of breath over the past 6 months.  He finds it difficult taking out his trash to his driveway and back to his home without getting severely short of breath.  He denies chest pain or palpitations.  He is a current smoker x40+ years.  He has a strong family history of CAD with early MIs in his brother age 106s, sister 2s.  He denies chest pain, palpitations, syncope.  Echocardiogram and coronary CTA was ordered.  Coronary CTA not able to be performed due to elevated heart rate.  Past Medical History:  Diagnosis Date   COPD (chronic obstructive pulmonary disease) ([place])    Coronary artery disease    Diabetes mellitus without complication ([place])    Hyperlipidemia    Hypertension    Peptic ulcer    Sleep apnea     Past Surgical History:  Procedure Laterality Date   COLONOSCOPY WITH PROPOFOL N/A 11/09/2016   Procedure: COLONOSCOPY WITH PROPOFOL;  Surgeon: Lollie Sails, MD;  Location: [facility] ENDOSCOPY;  Service: Endoscopy;  Laterality: N/A;   COLONOSCOPY WITH PROPOFOL N/A 07/06/2017   Procedure: COLONOSCOPY WITH PROPOFOL;  Surgeon: Lollie Sails, MD;  Location: [facility] ENDOSCOPY;  Service: Endoscopy;  Laterality: N/A;    ESOPHAGOGASTRODUODENOSCOPY (EGD) WITH PROPOFOL N/A 11/09/2016   Procedure: ESOPHAGOGASTRODUODENOSCOPY (EGD) WITH PROPOFOL;  Surgeon: Lollie Sails, MD;  Location: [facility] ENDOSCOPY;  Service: Endoscopy;  Laterality: N/A;   ESOPHAGOGASTRODUODENOSCOPY (EGD) WITH PROPOFOL N/A 07/06/2017   Procedure: ESOPHAGOGASTRODUODENOSCOPY (EGD) WITH PROPOFOL;  Surgeon: Lollie Sails, MD;  Location: [facility] ENDOSCOPY;  Service: Endoscopy;  Laterality: N/A;    Current Medications: Current Meds  Medication Sig   albuterol (VENTOLIN HFA) 108 (90 Base) MCG/ACT inhaler Inhale 2 puffs into the lungs every 4 (four) hours as needed for wheezing or shortness of breath.    aspirin-acetaminophen-caffeine (EXCEDRIN MIGRAINE) 250-250-65 MG tablet Take by mouth every 6 (six) hours as needed for headache.   atorvastatin (LIPITOR) 20 MG tablet Take 20 mg by mouth at bedtime.    buPROPion (WELLBUTRIN XL) 300 MG 24 hr tablet Take 300 mg by mouth daily.   calcium carbonate (CALCIUM 600) 600 MG TABS tablet Take 1 tablet (600 mg total) by mouth 2 (two) times daily with a meal for 30 days.   Fluticasone-Salmeterol (ADVAIR DISKUS) 100-50 MCG/DOSE AEPB Inhale 1 puff into the lungs 2 (two) times daily.    Fluticasone-Umeclidin-Vilant (TRELEGY ELLIPTA) 100-62.5-25 MCG/INH AEPB Inhale into the lungs.   furosemide (LASIX) 20 MG tablet Take 1 tablet (20 mg total) by mouth daily.   gabapentin (NEURONTIN) 300 MG capsule Take 300 mg by mouth 3 (three) times daily.    hydrochlorothiazide (MICROZIDE) 12.5 MG  capsule Take 12.5 mg by mouth daily.    loratadine (CLARITIN) 10 MG tablet Take 10 mg by mouth daily.   losartan (COZAAR) 50 MG tablet Take 1 tablet (50 mg total) by mouth daily.   Magnesium 500 MG CAPS Take 1 capsule (500 mg total) by mouth at bedtime.   meloxicam (MOBIC) 15 MG tablet Take 1 tablet (15 mg total) by mouth daily.   metFORMIN (GLUCOPHAGE) 1000 MG tablet Take 1,000 mg by mouth 2 (two) times daily with a  meal.   nicotine (NICOTROL) 10 MG inhaler Inhale into the lungs.   nortriptyline (PAMELOR) 25 MG capsule Take 25 mg by mouth at bedtime.   oxybutynin (DITROPAN) 5 MG tablet Take 5 mg by mouth every 12 (twelve) hours.   pantoprazole (PROTONIX) 40 MG tablet Take 40 mg by mouth 2 (two) times daily.    tiZANidine (ZANAFLEX) 4 MG tablet Take 1 tablet (4 mg total) by mouth every 8 (eight) hours as needed for muscle spasms.   TRUEPLUS 5-BEVEL PEN NEEDLES 31G X 6 MM MISC    VICTOZA 18 MG/3ML SOPN daily.    [DISCONTINUED] metoprolol tartrate (LOPRESSOR) 100 MG tablet Take 1 tablet (100 mg) 2 hours prior to your Cardiac CTA     Allergies:   Shellfish allergy, Codeine, and Lisinopril   Social History   Socioeconomic History   Marital status: Single    Spouse name: Not on file   Number of children: Not on file   Years of education: Not on file   Highest education level: Not on file  Occupational History   Not on file  Tobacco Use   Smoking status: Current Every Day Smoker    Packs/day: 1.00    Years: 30.00    Pack years: 30.00    Types: Cigarettes   Smokeless tobacco: Never Used   Tobacco comment: trying decrease  Substance and Sexual Activity   Alcohol use: No   Drug use: No   Sexual activity: Never  Other Topics Concern   Not on file  Social History Narrative   Not on file   Social Determinants of Health   Financial Resource Strain:    Difficulty of Paying Living Expenses:   Food Insecurity:    Worried About Charity fundraiser in the Last Year:    Arboriculturist in the Last Year:   Transportation Needs:    Film/video editor (Medical):    Lack of Transportation (Non-Medical):   Physical Activity:    Days of Exercise per Week:    Minutes of Exercise per Session:   Stress:    Feeling of Stress :   Social Connections:    Frequency of Communication with Friends and Family:    Frequency of Social Gatherings with Friends and Family:     Attends Religious Services:    Active Member of Clubs or Organizations:    Attends Music therapist:    Marital Status:      Family History: The patient's family history includes Diabetes in his father and mother; Heart attack in his father, paternal aunt, and sister; Stomach cancer in his father.  ROS:   Please see the history of present illness.     All other systems reviewed and are negative.  EKGs/Bell/Other Studies Reviewed:    The following studies were reviewed today:   EKG:  EKG is  ordered today.  The ekg ordered today demonstrates normal sinus rhythm, normal ECG.  Recent Bell: 06/29/2018: Hemoglobin  12.9; Magnesium 2.0; Platelets 247 03/23/2019: BUN 17; Creatinine, Ser 1.16; Potassium 3.8; Sodium 134  Recent Lipid Panel No results found for: CHOL, TRIG, HDL, CHOLHDL, VLDL, LDLCALC, LDLDIRECT  Physical Exam:    VS:  BP 132/80 (BP Location: Left Arm, Patient Position: Sitting, Cuff Size: Large)    Pulse 97    Ht 5\' 9"  (1.753 m)    Wt (!) 311 lb 6 oz (141.2 kg)    SpO2 97%    BMI 45.98 kg/m     Wt Readings from Last 3 Encounters:  03/31/19 (!) 311 lb 6 oz (141.2 kg)  03/21/19 (!) 310 lb (140.6 kg)  03/09/19 (!) 312 lb (141.5 kg)     GEN:  Well nourished, well developed in no acute distress HEENT: Normal NECK: No JVD; No carotid bruits LYMPHATICS: No lymphadenopathy CARDIAC: RRR, no murmurs, rubs, gallops RESPIRATORY: Poor inspiratory effort ABDOMEN: Soft, non-tender, non-distended MUSCULOSKELETAL: Raised edema; No deformity  SKIN: Warm and dry NEUROLOGIC:  Alert and oriented x 3 PSYCHIATRIC:  Normal affect   ASSESSMENT:    1. Dyspnea on exertion   2. Smoking   3. Essential hypertension    PLAN:    In order of problems listed above:  1. Patient with worsening dyspnea on exertion.  This could be an anginal equivalent in light of risk factors of diabetes, hypertension, hyperlipidemia, current smoker.  Echocardiogram showed normal systolic  and diastolic function with EF of 60 to 65%.  Coronary CTA not performed due to elevated heart rates.  Will evaluate patient with a Lexiscan myocardial perfusion imaging stress test.  Worsening COPD/emphysema, morbid obesity/deconditioning is also in the differential.  If heart function tests are normal, then his worsening symptoms are likely due to his pulmonary disease. 2. Long history of smoking.  Cessation advised. 3. Blood pressure adequately controlled.  Continue HCTZ and losartan as currently prescribed.  Follow-up after stress test.  This note was generated in part or whole with voice recognition software. Voice recognition is usually quite accurate but there are transcription errors that can and very often do occur. I apologize for any typographical errors that were not detected and corrected.  Medication Adjustments/Bell and Tests Ordered: Current medicines are reviewed at length with the patient today.  Concerns regarding medicines are outlined above.  Orders Placed This Encounter  Procedures   NM Myocar Multi W/Spect W/Wall Motion / EF   EKG 12-Lead   No orders of the defined types were placed in this encounter.   Patient Instructions  Medication Instructions:  Your physician recommends that you continue on your current medications as directed. Please refer to the Current Medication list given to you today.  *If you need a refill on your cardiac medications before your next appointment, please call your pharmacy*   Lab Work: None ordered If you have Bell (blood work) drawn today and your tests are completely normal, you will receive your results only by:  [place] (if you have MyChart) OR  A paper copy in the mail If you have any lab test that is abnormal or we need to change your treatment, we will call you to review the results.   Testing/Procedures: Your physician has requested that you have a lexiscan myoview. For further information please visit  [email or web address]. Please follow instruction sheet, as given.     Follow-Up: At [facility], you and your health needs are our priority.  As part of our continuing mission to provide you with exceptional heart care, we have  created designated Provider Care Teams.  These Care Teams include your primary Cardiologist (physician) and Advanced Practice Providers (APPs -  Physician Assistants and Nurse Practitioners) who all work together to provide you with the care you need, when you need it.  We recommend signing up for the patient portal called "MyChart".  Sign up information is provided on this After Visit Summary.  MyChart is used to connect with patients for Virtual Visits (Telemedicine).  Patients are able to view lab/test results, encounter notes, upcoming appointments, etc.  Non-urgent messages can be sent to your provider as well.   To learn more about what you can do with MyChart, go to [email or web address].    Your next appointment:   After testing    The format for your next appointment:   In Person  Provider:    You may see Dr. Garen Lah or one of the following Advanced Practice Providers on your designated Care Team:    Murray Hodgkins, NP  Christell Faith, PA-C  Marrianne Mood, PA-C    Other Instructions [place]  Your caregiver has ordered a Stress Test with nuclear imaging. The purpose of this test is to evaluate the blood supply to your heart muscle. This procedure is referred to as a "Non-Invasive Stress Test." This is because other than having an IV started in your vein, nothing is inserted or "invades" your body. Cardiac stress tests are done to find areas of poor blood flow to the heart by determining the extent of coronary artery disease (CAD). Some patients exercise on a treadmill, which naturally increases the blood flow to your heart, while others who are  unable to walk on a treadmill due to physical limitations have a pharmacologic/chemical stress  agent called Lexiscan . This medicine will mimic walking on a treadmill by temporarily increasing your coronary blood flow.   Please note: these test may take anywhere between 2-4 hours to complete  PLEASE REPORT TO [place] AT THE FIRST DESK WILL DIRECT YOU WHERE TO GO  Date of Procedure:_____________________________________  Arrival Time for Procedure:______________________________  Instructions regarding medication:   __X__ : Hold diabetes medication morning of procedure    __X__:  Hold other medications as follows:__Furosemide and HCTZ the morning of the test   PLEASE NOTIFY THE OFFICE AT LEAST 24 HOURS IN ADVANCE IF YOU ARE UNABLE TO KEEP YOUR APPOINTMENT.  [phone] AND  PLEASE NOTIFY NUCLEAR MEDICINE AT [facility] AT LEAST 24 HOURS IN ADVANCE IF YOU ARE UNABLE TO KEEP YOUR APPOINTMENT. [phone]  How to prepare for your Myoview test:  1. Do not eat or drink after midnight 2. No caffeine for 24 hours prior to test 3. No smoking 24 hours prior to test. 4. Your medication may be taken with water.  If your doctor stopped a medication because of this test, do not take that medication. 5. Ladies, please do not wear dresses.  Skirts or pants are appropriate. Please wear a short sleeve shirt. 6. No perfume, cologne or lotion. 7. Wear comfortable walking shoes. No heels!               Signed, Kate Sable, MD  03/31/2019 4:01 PM    [facility]

## 2019-04-06 ENCOUNTER — Other Ambulatory Visit: Payer: Self-pay

## 2019-04-06 ENCOUNTER — Encounter
Admission: RE | Admit: 2019-04-06 | Discharge: 2019-04-06 | Disposition: A | Discharge status: Home / Self Care | Payer: Medicare Other | Source: Ambulatory Visit | Attending: Cardiology | Admitting: Cardiology

## 2019-04-06 DIAGNOSIS — R0609 Other forms of dyspnea: Secondary | ICD-10-CM

## 2019-04-06 DIAGNOSIS — R0600 Dyspnea, unspecified: Secondary | ICD-10-CM | POA: Insufficient documentation

## 2019-04-06 MED ORDER — TECHNETIUM TC 99M TETROFOSMIN IV KIT
11.2500 | PACK | Freq: Once | INTRAVENOUS | Status: AC | PRN
  Administered 2019-04-06: 08:00:00 11.25 via INTRAVENOUS

## 2019-04-06 MED ORDER — TECHNETIUM TC 99M TETROFOSMIN IV KIT
30.0000 | PACK | Freq: Once | INTRAVENOUS | Status: AC | PRN
  Administered 2019-04-06: 32.487 via INTRAVENOUS

## 2019-04-06 MED ORDER — REGADENOSON 0.4 MG/5ML IV SOLN
0.4000 mg | Freq: Once | INTRAVENOUS | Status: AC
  Administered 2019-04-06: 09:00:00 0.4 mg via INTRAVENOUS

## 2019-04-07 LAB — NM MYOCAR MULTI W/SPECT W/WALL MOTION / EF
Estimated workload: 1 METS
Exercise duration (min): 0 min
Exercise duration (sec): 0 s
LV dias vol: 104 mL (ref 62–150)
LV sys vol: 40 mL
MPHR: 167 {beats}/min
Peak HR: 113 {beats}/min
Percent HR: 67 %
Rest HR: 93 {beats}/min
SDS: 1
SRS: 2
SSS: 1
TID: 1.12

## 2019-04-21 ENCOUNTER — Encounter: Payer: Self-pay | Admitting: Cardiology

## 2019-04-21 ENCOUNTER — Ambulatory Visit (INDEPENDENT_AMBULATORY_CARE_PROVIDER_SITE_OTHER): Payer: Medicare Other | Admitting: Cardiology

## 2019-04-21 ENCOUNTER — Other Ambulatory Visit: Payer: Self-pay

## 2019-04-21 VITALS — BP 114/74 | HR 110 | Ht 69.0 in | Wt 316.0 lb

## 2019-04-21 DIAGNOSIS — F17200 Nicotine dependence, unspecified, uncomplicated: Secondary | ICD-10-CM

## 2019-04-21 DIAGNOSIS — R0600 Dyspnea, unspecified: Secondary | ICD-10-CM | POA: Diagnosis not present

## 2019-04-21 DIAGNOSIS — I10 Essential (primary) hypertension: Secondary | ICD-10-CM

## 2019-04-21 DIAGNOSIS — R0609 Other forms of dyspnea: Secondary | ICD-10-CM

## 2019-04-21 NOTE — Patient Instructions (Signed)

## 2019-04-21 NOTE — Progress Notes (Signed)
Cardiology Office Note:    Date:  04/21/2019   ID:  [name], DOB [birth year], MRN [id number]  PCP:  Jonathan Burrow, MD  Cardiologist:  No primary care provider on file.  Electrophysiologist:  None   Referring MD: [name]*   Chief Complaint  Patient presents with  . office visit    Pt having pressure in chest making it hard for pt to breathe. Meds verbally reviewed w/ pt    History of Present Illness:    [name] is a 54 y.o. male with a hx of COPD, current smoker x40+ years, sleep apnea, diabetes, hypertension, hyperlipidemia who presents for follow-up.  He was last seen due to a 50-month history of shortness of breath.  He has risk factors of diabetes, current smoker, strong family history of early CAD.  Lexiscan stress test was ordered.  He now presents for results.  He still has shortness of breath and is a current smoker.   Past Medical History:  Diagnosis Date  . COPD (chronic obstructive pulmonary disease) ([place])   . Coronary artery disease   . Diabetes mellitus without complication ([place])   . Hyperlipidemia   . Hypertension   . Peptic ulcer   . Sleep apnea     Past Surgical History:  Procedure Laterality Date  . COLONOSCOPY WITH PROPOFOL N/A 11/09/2016   Procedure: COLONOSCOPY WITH PROPOFOL;  Surgeon: Jonathan Sails, MD;  Location: [facility] ENDOSCOPY;  Service: Endoscopy;  Laterality: N/A;  . COLONOSCOPY WITH PROPOFOL N/A 07/06/2017   Procedure: COLONOSCOPY WITH PROPOFOL;  Surgeon: Jonathan Sails, MD;  Location: [facility] ENDOSCOPY;  Service: Endoscopy;  Laterality: N/A;  . ESOPHAGOGASTRODUODENOSCOPY (EGD) WITH PROPOFOL N/A 11/09/2016   Procedure: ESOPHAGOGASTRODUODENOSCOPY (EGD) WITH PROPOFOL;  Surgeon: Jonathan Sails, MD;  Location: [facility] ENDOSCOPY;  Service: Endoscopy;  Laterality: N/A;  . ESOPHAGOGASTRODUODENOSCOPY (EGD) WITH PROPOFOL N/A 07/06/2017   Procedure: ESOPHAGOGASTRODUODENOSCOPY (EGD) WITH PROPOFOL;  Surgeon:  Jonathan Sails, MD;  Location: [facility] ENDOSCOPY;  Service: Endoscopy;  Laterality: N/A;    Current Medications: Current Meds  Medication Sig  . albuterol (VENTOLIN HFA) 108 (90 Base) MCG/ACT inhaler Inhale 2 puffs into the lungs every 4 (four) hours as needed for wheezing or shortness of breath.   Marland Kitchen aspirin-acetaminophen-caffeine (EXCEDRIN MIGRAINE) 250-250-65 MG tablet Take by mouth every 6 (six) hours as needed for headache.  Marland Kitchen atorvastatin (LIPITOR) 20 MG tablet Take 20 mg by mouth at bedtime.   Marland Kitchen buPROPion (WELLBUTRIN XL) 300 MG 24 hr tablet Take 300 mg by mouth daily.  . calcium carbonate (CALCIUM 600) 600 MG TABS tablet Take 1 tablet (600 mg total) by mouth 2 (two) times daily with a meal for 30 days.  . Fluticasone-Salmeterol (ADVAIR DISKUS) 100-50 MCG/DOSE AEPB Inhale 1 puff into the lungs 2 (two) times daily.   . Fluticasone-Umeclidin-Vilant (TRELEGY ELLIPTA) 100-62.5-25 MCG/INH AEPB Inhale into the lungs.  . furosemide (LASIX) 20 MG tablet Take 1 tablet (20 mg total) by mouth daily.  Marland Kitchen gabapentin (NEURONTIN) 300 MG capsule Take 300 mg by mouth 3 (three) times daily.   . hydrochlorothiazide (MICROZIDE) 12.5 MG capsule Take 12.5 mg by mouth daily.   Marland Kitchen loratadine (CLARITIN) 10 MG tablet Take 10 mg by mouth daily.  Marland Kitchen losartan (COZAAR) 50 MG tablet Take 1 tablet (50 mg total) by mouth daily.  . Magnesium 500 MG CAPS Take 1 capsule (500 mg total) by mouth at bedtime.  . meloxicam (MOBIC) 15 MG tablet Take 1 tablet (15 mg  total) by mouth daily.  . metFORMIN (GLUCOPHAGE) 1000 MG tablet Take 1,000 mg by mouth 2 (two) times daily with a meal.  . nicotine (NICOTROL) 10 MG inhaler Inhale into the lungs.  . nortriptyline (PAMELOR) 25 MG capsule Take 25 mg by mouth at bedtime.  Marland Kitchen oxybutynin (DITROPAN) 5 MG tablet Take 5 mg by mouth every 12 (twelve) hours.  . pantoprazole (PROTONIX) 40 MG tablet Take 40 mg by mouth 2 (two) times daily.   Marland Kitchen tiZANidine (ZANAFLEX) 4 MG tablet Take 1 tablet (4 mg  total) by mouth every 8 (eight) hours as needed for muscle spasms.  . TRUEPLUS 5-BEVEL PEN NEEDLES 31G X 6 MM MISC   . VICTOZA 18 MG/3ML SOPN daily.      Allergies:   Shellfish allergy, Codeine, and Lisinopril   Social History   Socioeconomic History  . Marital status: Single    Spouse name: Not on file  . Number of children: Not on file  . Years of education: Not on file  . Highest education level: Not on file  Occupational History  . Not on file  Tobacco Use  . Smoking status: Current Every Day Smoker    Packs/day: 0.75    Years: 30.00    Pack years: 22.50    Types: Cigarettes  . Smokeless tobacco: Never Used  . Tobacco comment: trying decrease  Substance and Sexual Activity  . Alcohol use: No  . Drug use: No  . Sexual activity: Never  Other Topics Concern  . Not on file  Social History Narrative  . Not on file   Social Determinants of Health   Financial Resource Strain:   . Difficulty of Paying Living Expenses:   Food Insecurity:   . Worried About Charity fundraiser in the Last Year:   . Arboriculturist in the Last Year:   Transportation Needs:   . Film/video editor (Medical):   Marland Kitchen Lack of Transportation (Non-Medical):   Physical Activity:   . Days of Exercise per Week:   . Minutes of Exercise per Session:   Stress:   . Feeling of Stress :   Social Connections:   . Frequency of Communication with Friends and Family:   . Frequency of Social Gatherings with Friends and Family:   . Attends Religious Services:   . Active Member of Clubs or Organizations:   . Attends Archivist Meetings:   Marland Kitchen Marital Status:      Family History: The patient's family history includes Diabetes in his father and mother; Heart attack in his father, paternal aunt, and sister; Stomach cancer in his father.  ROS:   Please see the history of present illness.     All other systems reviewed and are negative.  EKGs/Bell/Other Studies Reviewed:    The following  studies were reviewed today:   EKG:  EKG is  ordered today.  The ekg ordered today demonstrates sinus tachycardia, heart rate 110.Marland Kitchen  Recent Bell: 06/29/2018: Hemoglobin 12.9; Magnesium 2.0; Platelets 247 03/23/2019: BUN 17; Creatinine, Ser 1.16; Potassium 3.8; Sodium 134  Recent Lipid Panel No results found for: CHOL, TRIG, HDL, CHOLHDL, VLDL, LDLCALC, LDLDIRECT  Physical Exam:    VS:  BP 114/74 (BP Location: Left Arm, Patient Position: Sitting, Cuff Size: Large)   Pulse (!) 110   Ht 5\' 9"  (1.753 m)   Wt (!) 316 lb (143.3 kg)   SpO2 96%   BMI 46.67 kg/m     Wt Readings from  Last 3 Encounters:  04/21/19 (!) 316 lb (143.3 kg)  03/31/19 (!) 311 lb 6 oz (141.2 kg)  03/21/19 (!) 310 lb (140.6 kg)     GEN:  Well nourished, well developed in no acute distress HEENT: Normal NECK: No JVD; No carotid bruits LYMPHATICS: No lymphadenopathy CARDIAC: RRR, no murmurs, rubs, gallops RESPIRATORY: Poor inspiratory effort ABDOMEN: Soft, non-tender, non-distended MUSCULOSKELETAL: Raised edema; No deformity  SKIN: Warm and dry NEUROLOGIC:  Alert and oriented x 3 PSYCHIATRIC:  Normal affect   ASSESSMENT:    1. Dyspnea on exertion   2. Smoking   3. Essential hypertension    PLAN:    In order of problems listed above:  1. Patient with worsening dyspnea on exertion.  This could be an anginal equivalent in light of risk factors of diabetes, hypertension, hyperlipidemia, current smoker.  Last echocardiogram showed normal systolic and diastolic function with EF of 60 to 65%.  Coronary CTA not performed due to elevated heart rates.  Lexiscan stress test did not show any evidence for ischemia.  EF was normal.  His symptoms of shortness of breath with exertion are likely multifactorial with Worsening COPD/emphysema, morbid obesity/deconditioning in the differential.  Further cardiac testing indicated at this time.  Weight loss advised. 2. Long history of smoking.  Cessation advised. 3. Blood  pressure adequately controlled.  Continue HCTZ and losartan as currently prescribed.  Follow-up as needed  This note was generated in part or whole with voice recognition software. Voice recognition is usually quite accurate but there are transcription errors that can and very often do occur. I apologize for any typographical errors that were not detected and corrected.  Medication Adjustments/Bell and Tests Ordered: Current medicines are reviewed at length with the patient today.  Concerns regarding medicines are outlined above.  Orders Placed This Encounter  Procedures  . EKG 12-Lead   No orders of the defined types were placed in this encounter.   Patient Instructions  Medication Instructions:  Your physician recommends that you continue on your current medications as directed. Please refer to the Current Medication list given to you today.  *If you need a refill on your cardiac medications before your next appointment, please call your pharmacy*  Follow-Up: At [facility], you and your health needs are our priority.  As part of our continuing mission to provide you with exceptional heart care, we have created designated Provider Care Teams.  These Care Teams include your primary Cardiologist (physician) and Advanced Practice Providers (APPs -  Physician Assistants and Nurse Practitioners) who all work together to provide you with the care you need, when you need it.  We recommend signing up for the patient portal called "MyChart".  Sign up information is provided on this After Visit Summary.  MyChart is used to connect with patients for Virtual Visits (Telemedicine).  Patients are able to view lab/test results, encounter notes, upcoming appointments, etc.  Non-urgent messages can be sent to your provider as well.   To learn more about what you can do with MyChart, go to [email or web address].    Your next appointment:   As needed.     Signed, Kate Sable, MD  04/21/2019  4:44 PM    [facility]

## 2019-05-15 ENCOUNTER — Ambulatory Visit (INDEPENDENT_AMBULATORY_CARE_PROVIDER_SITE_OTHER): Payer: Medicare Other | Admitting: Podiatry

## 2019-05-15 ENCOUNTER — Other Ambulatory Visit: Payer: Self-pay

## 2019-05-15 ENCOUNTER — Encounter: Payer: Self-pay | Admitting: Podiatry

## 2019-05-15 DIAGNOSIS — M79675 Pain in left toe(s): Secondary | ICD-10-CM

## 2019-05-15 DIAGNOSIS — I890 Lymphedema, not elsewhere classified: Secondary | ICD-10-CM

## 2019-05-15 DIAGNOSIS — I209 Angina pectoris, unspecified: Secondary | ICD-10-CM

## 2019-05-15 DIAGNOSIS — B351 Tinea unguium: Secondary | ICD-10-CM

## 2019-05-15 DIAGNOSIS — E1151 Type 2 diabetes mellitus with diabetic peripheral angiopathy without gangrene: Secondary | ICD-10-CM

## 2019-05-15 DIAGNOSIS — M79674 Pain in right toe(s): Secondary | ICD-10-CM

## 2019-05-15 NOTE — Progress Notes (Signed)
This patient returns to my office for at risk foot care.  This patient requires this care by a professional since this patient will be at risk due to having lymphedema and diabetes.  Patient had previously been a patient of Dr. Caryl Comes.  This patient is unable to cut nails himself since the patient cannot reach his nails.These nails are painful walking and wearing shoes.  This patient presents for at risk foot care today.  General Appearance  Alert, conversant and in no acute stress.  Vascular  Dorsalis pedis and posterior tibial  pulses are not  palpable  bilaterally.  Capillary return is diminished  bilaterally. Temperature is within normal limits  bilaterally.  Neurologic  Senn-Weinstein monofilament wire test within normal limits  bilaterally. Muscle power within normal limits bilaterally.  Nails Thick disfigured discolored nails with subungual debris  from hallux to fifth toes bilaterally. No evidence of bacterial infection or drainage bilaterally.  Orthopedic  No limitations of motion  feet .  No crepitus or effusions noted.  No bony pathology or digital deformities noted.  Skin  normotropic skin with no porokeratosis noted bilaterally.  No signs of infections or ulcers noted.     Onychomycosis  Pain in right toes  Pain in left toes  Consent was obtained for treatment procedures.   Mechanical debridement of nails 1-5  bilaterally performed with a nail nipper.  Filed with dremel without incident.    Return office visit    3 months                  Told patient to return for periodic foot care and evaluation due to potential at risk complications.   Gardiner Barefoot DPM

## 2019-08-17 ENCOUNTER — Other Ambulatory Visit: Payer: Self-pay

## 2019-08-17 ENCOUNTER — Encounter: Payer: Self-pay | Admitting: Podiatry

## 2019-08-17 ENCOUNTER — Ambulatory Visit (INDEPENDENT_AMBULATORY_CARE_PROVIDER_SITE_OTHER): Payer: Medicare HMO | Admitting: Podiatry

## 2019-08-17 DIAGNOSIS — I890 Lymphedema, not elsewhere classified: Secondary | ICD-10-CM

## 2019-08-17 DIAGNOSIS — M79674 Pain in right toe(s): Secondary | ICD-10-CM

## 2019-08-17 DIAGNOSIS — B351 Tinea unguium: Secondary | ICD-10-CM

## 2019-08-17 DIAGNOSIS — M79675 Pain in left toe(s): Secondary | ICD-10-CM

## 2019-08-17 DIAGNOSIS — E1151 Type 2 diabetes mellitus with diabetic peripheral angiopathy without gangrene: Secondary | ICD-10-CM | POA: Diagnosis not present

## 2019-08-17 DIAGNOSIS — I872 Venous insufficiency (chronic) (peripheral): Secondary | ICD-10-CM | POA: Diagnosis not present

## 2019-08-17 NOTE — Progress Notes (Signed)
This patient returns to my office for at risk foot care.  This patient requires this care by a professional since this patient will be at risk due to having lymphedema and diabetes.  Patient had previously been a patient of Dr. Caryl Comes.  This patient is unable to cut nails himself since the patient cannot reach his nails.These nails are painful walking and wearing shoes.  Patient says his feet are continuallt painful.  This patient presents for at risk foot care today.  General Appearance  Alert, conversant and in no acute stress.  Vascular  Dorsalis pedis and posterior tibial  pulses are not  palpable  bilaterally.  Capillary return is diminished  bilaterally. Temperature is within normal limits  Bilaterally. Venous stasis  B/L.  Neurologic  Senn-Weinstein monofilament wire test absent   bilaterally. Muscle power within normal limits bilaterally.  Nails Thick disfigured discolored nails with subungual debris  from hallux to fifth toes bilaterally. No evidence of bacterial infection or drainage bilaterally.  Orthopedic  No limitations of motion  feet .  No crepitus or effusions noted.  No bony pathology or digital deformities noted.  Skin  normotropic skin with no porokeratosis noted bilaterally.  No signs of infections or ulcers noted.   Callus both heels plantarly.  Toes are crusty.  Onychomycosis  Pain in right toes  Pain in left toes  Consent was obtained for treatment procedures.   Mechanical debridement of nails 1-5  bilaterally performed with a nail nipper.  Filed with dremel without incident.  Patient cannot use moisturizer since he cannot reach his feet.   Return office visit    3 months                  Told patient to return for periodic foot care and evaluation due to potential at risk complications.   Gardiner Barefoot DPM

## 2019-11-14 ENCOUNTER — Encounter: Payer: Self-pay | Admitting: Pain Medicine

## 2019-11-14 NOTE — Progress Notes (Signed)
Unsuccessful attempt to contact patient for Virtual Visit (Pain Management Telehealth)   Patient provided contact information:  [phone] (home); [phone] (mobile); (Preferred) [phone] [email or web address]   Pre-screening:  Our staff was successful in contacting [name] using the above provided information.   I unsuccessfully attempted to make contact with [name] on 3 different occasions on 11/15/2019 via telephone. I was unable to complete the virtual encounter due to call going directly to voicemail. I was able to leave a message where I clearly identify myself as Gaspar Cola, MD and I left a message to call us back to reschedule the call.  In addition, today I left a message to make the patient aware of the fact that we are transferring the nonopioids to the PCP.  Nonopioids transferred 11/15/2019: Calcium, magnesium, meloxicam, and the tizanidine.  Pharmacotherapy Assessment  Analgesic: No opioid analgesics prescribed by our practice. MME/day: 0 mg/day.    Pharmacotherapy (Medications Ordered): Meds ordered this encounter  Medications  . tiZANidine (ZANAFLEX) 4 MG tablet    Sig: Take 1 tablet (4 mg total) by mouth every 8 (eight) hours as needed for muscle spasms.    Dispense:  90 tablet    Refill:  2    Fill one day early if pharmacy is closed on scheduled refill date. May substitute for generic, or similar, if available. Void any older refills or prescriptions of this medication.  . Magnesium 500 MG CAPS    Sig: Take 1 capsule (500 mg total) by mouth at bedtime.    Dispense:  90 capsule    Refill:  0    Fill one day early if pharmacy is closed on scheduled refill date. May substitute for generic, or similar, if available. Void any older refills or prescriptions of this medication.  . calcium carbonate (CALCIUM 600) 600 MG TABS tablet    Sig: Take 1 tablet (600 mg total) by mouth 2 (two) times daily with a meal.    Dispense:  60 tablet     Refill:  2    Fill one day early if pharmacy is closed on scheduled refill date. May substitute for generic, or similar, if available. Void any older refills or prescriptions of this medication.  . meloxicam (MOBIC) 15 MG tablet    Sig: Take 1 tablet (15 mg total) by mouth daily.    Dispense:  90 tablet    Refill:  0    Fill one day early if pharmacy is closed on scheduled refill date. May substitute for generic, or similar, if available. Void any older refills or prescriptions of this medication.   Follow-up plan:   Reschedule Visit.     Interventional management options:  Considerations:   NOTE:NO RFAuntil BMI is <35 Diagnostic bilateral L4 transforaminal LESI#1 Diagnostic (Midline) L3-4 LESI #1 Possible bilateral lumbar facetRFA (No RFA until BMI<35)   Palliative PRN treatment(s):   Diagnostic bilateral L4 TFLESI #2 + Midline L3-4 LESI #2  Diagnostic bilateral lumbar facet block#4under fluoroscopic guidance and IV sedation     Recent Visits No visits were found meeting these conditions. Showing recent visits within past 90 days and meeting all other requirements Today's Visits Date Type Provider Dept  11/15/19 Telemedicine Milinda Pointer, MD [facility]  Showing today's visits and meeting all other requirements Future Appointments No visits were found meeting these conditions. Showing future appointments within next 90 days and meeting all other requirements   Note by: Gaspar Cola, MD Date: 11/15/2019; Time: 12:31 PM

## 2019-11-15 ENCOUNTER — Other Ambulatory Visit: Payer: Self-pay

## 2019-11-15 ENCOUNTER — Ambulatory Visit: Payer: Medicare HMO | Attending: Pain Medicine | Admitting: Pain Medicine

## 2019-11-15 DIAGNOSIS — E559 Vitamin D deficiency, unspecified: Secondary | ICD-10-CM

## 2019-11-15 DIAGNOSIS — M79605 Pain in left leg: Secondary | ICD-10-CM

## 2019-11-15 DIAGNOSIS — G894 Chronic pain syndrome: Secondary | ICD-10-CM

## 2019-11-15 DIAGNOSIS — M7918 Myalgia, other site: Secondary | ICD-10-CM

## 2019-11-15 DIAGNOSIS — M8949 Other hypertrophic osteoarthropathy, multiple sites: Secondary | ICD-10-CM

## 2019-11-15 DIAGNOSIS — M79604 Pain in right leg: Secondary | ICD-10-CM

## 2019-11-15 DIAGNOSIS — M545 Low back pain, unspecified: Secondary | ICD-10-CM

## 2019-11-15 DIAGNOSIS — M47816 Spondylosis without myelopathy or radiculopathy, lumbar region: Secondary | ICD-10-CM

## 2019-11-15 DIAGNOSIS — G8929 Other chronic pain: Secondary | ICD-10-CM

## 2019-11-15 MED ORDER — TIZANIDINE HCL 4 MG PO TABS: 4.0000 mg | ORAL_TABLET | Freq: Three times a day (TID) | ORAL | 2 refills | Status: DC | PRN

## 2019-11-15 MED ORDER — MELOXICAM 15 MG PO TABS: 15.0000 mg | ORAL_TABLET | Freq: Every day | ORAL | 0 refills | Status: DC

## 2019-11-15 MED ORDER — MAGNESIUM 500 MG PO CAPS: 500.0000 mg | ORAL_CAPSULE | Freq: Every day | ORAL | 0 refills | Status: DC

## 2019-11-15 MED ORDER — CALCIUM CARBONATE 600 MG PO TABS: 600.0000 mg | ORAL_TABLET | Freq: Two times a day (BID) | ORAL | 2 refills | Status: DC

## 2019-11-20 ENCOUNTER — Ambulatory Visit: Payer: Medicare Other | Admitting: Podiatry

## 2019-11-22 ENCOUNTER — Telehealth: Payer: Medicare Other | Admitting: Pain Medicine

## 2019-12-25 ENCOUNTER — Telehealth: Payer: Self-pay | Admitting: *Deleted

## 2019-12-25 NOTE — Telephone Encounter (Signed)
I attempted to call the patient to reschedule his appointment.  He sent a MyChart message asking for a call to reschedule his appointment.

## 2020-01-15 ENCOUNTER — Ambulatory Visit: Payer: Medicare HMO | Admitting: Podiatry

## 2020-01-22 ENCOUNTER — Ambulatory Visit (INDEPENDENT_AMBULATORY_CARE_PROVIDER_SITE_OTHER): Payer: Medicare HMO | Admitting: Podiatry

## 2020-01-22 ENCOUNTER — Other Ambulatory Visit: Payer: Self-pay

## 2020-01-22 ENCOUNTER — Encounter: Payer: Self-pay | Admitting: Podiatry

## 2020-01-22 DIAGNOSIS — E1151 Type 2 diabetes mellitus with diabetic peripheral angiopathy without gangrene: Secondary | ICD-10-CM

## 2020-01-22 DIAGNOSIS — B351 Tinea unguium: Secondary | ICD-10-CM

## 2020-01-22 DIAGNOSIS — I890 Lymphedema, not elsewhere classified: Secondary | ICD-10-CM

## 2020-01-22 DIAGNOSIS — I872 Venous insufficiency (chronic) (peripheral): Secondary | ICD-10-CM | POA: Diagnosis not present

## 2020-01-22 DIAGNOSIS — M79675 Pain in left toe(s): Secondary | ICD-10-CM

## 2020-01-22 DIAGNOSIS — M79674 Pain in right toe(s): Secondary | ICD-10-CM

## 2020-01-22 NOTE — Progress Notes (Signed)
This patient returns to my office for at risk foot care.  This patient requires this care by a professional since this patient will be at risk due to having lymphedema and diabetes.   This patient is unable to cut nails himself since the patient cannot reach his nails.These nails are painful walking and wearing shoes.  This patient presents for at risk foot care today.  General Appearance  Alert, conversant and in no acute stress.  Vascular  Dorsalis pedis and posterior tibial  pulses are not  Palpable due to swelling.  bilaterally.  Capillary return is diminished  bilaterally. Temperature is within normal limits  bilaterally.  Neurologic  Senn-Weinstein monofilament wire test within normal limits  bilaterally. Muscle power within normal limits bilaterally.  Nails Thick disfigured discolored nails with subungual debris  from hallux to fifth toes bilaterally. No evidence of bacterial infection or drainage bilaterally.  Orthopedic  No limitations of motion  feet .  No crepitus or effusions noted.  No bony pathology or digital deformities noted.  Skin   No  porokeratosis noted bilaterally.  No signs of infections or ulcers noted.   Rough hard skin interdigitally left foot.  Onychomycosis  Pain in right toes  Pain in left toes  Consent was obtained for treatment procedures.   Mechanical debridement of nails 1-5  bilaterally performed with a nail nipper.  Filed with dremel without incident. Told patient to apply amlactin.   Return office visit    3 months                  Told patient to return for periodic foot care and evaluation due to potential at risk complications.   Gardiner Barefoot DPM

## 2020-03-01 ENCOUNTER — Telehealth (INDEPENDENT_AMBULATORY_CARE_PROVIDER_SITE_OTHER): Payer: Self-pay | Admitting: Vascular Surgery

## 2020-03-01 NOTE — Telephone Encounter (Signed)
Called stating that both of his legs are swollen,red and his hands/feet are starting to numb at times. He states the issues he has been ongoing for about 1-53mos. Patient was last seen 07/08/2018 with cellulitis of ble. Please advise.

## 2020-03-01 NOTE — Telephone Encounter (Signed)
Bring patient in with ABI/Bil venous reflux see fallon or Dew

## 2020-03-04 ENCOUNTER — Other Ambulatory Visit: Payer: Self-pay | Admitting: Physician Assistant

## 2020-03-04 NOTE — Telephone Encounter (Signed)
Lvm for patient to callback to be scheduled

## 2020-03-07 NOTE — Telephone Encounter (Signed)
Patient called back to be scheduled  

## 2020-03-14 ENCOUNTER — Other Ambulatory Visit (INDEPENDENT_AMBULATORY_CARE_PROVIDER_SITE_OTHER): Payer: Self-pay | Admitting: Nurse Practitioner

## 2020-03-14 DIAGNOSIS — R200 Anesthesia of skin: Secondary | ICD-10-CM

## 2020-03-14 DIAGNOSIS — M7989 Other specified soft tissue disorders: Secondary | ICD-10-CM

## 2020-03-20 ENCOUNTER — Encounter: Payer: Self-pay | Admitting: Dermatology

## 2020-03-20 ENCOUNTER — Other Ambulatory Visit: Payer: Self-pay

## 2020-03-20 ENCOUNTER — Ambulatory Visit (INDEPENDENT_AMBULATORY_CARE_PROVIDER_SITE_OTHER): Payer: Medicare HMO | Admitting: Dermatology

## 2020-03-20 DIAGNOSIS — M79672 Pain in left foot: Secondary | ICD-10-CM

## 2020-03-20 DIAGNOSIS — R200 Anesthesia of skin: Secondary | ICD-10-CM

## 2020-03-20 DIAGNOSIS — I872 Venous insufficiency (chronic) (peripheral): Secondary | ICD-10-CM | POA: Diagnosis not present

## 2020-03-20 DIAGNOSIS — M79641 Pain in right hand: Secondary | ICD-10-CM | POA: Diagnosis not present

## 2020-03-20 DIAGNOSIS — M79671 Pain in right foot: Secondary | ICD-10-CM

## 2020-03-20 DIAGNOSIS — T1490XA Injury, unspecified, initial encounter: Secondary | ICD-10-CM

## 2020-03-20 DIAGNOSIS — G629 Polyneuropathy, unspecified: Secondary | ICD-10-CM

## 2020-03-20 DIAGNOSIS — M79642 Pain in left hand: Secondary | ICD-10-CM

## 2020-03-20 DIAGNOSIS — L304 Erythema intertrigo: Secondary | ICD-10-CM

## 2020-03-20 MED ORDER — HYDROCORTISONE 2.5 % EX LOTN: TOPICAL_LOTION | CUTANEOUS | 2 refills | Status: DC

## 2020-03-20 MED ORDER — KETOCONAZOLE 2 % EX CREA: 1.0000 "application " | TOPICAL_CREAM | CUTANEOUS | 3 refills | Status: AC

## 2020-03-20 MED ORDER — TRIAMCINOLONE ACETONIDE 0.1 % EX OINT: 1.0000 "application " | TOPICAL_OINTMENT | Freq: Every day | CUTANEOUS | 2 refills | Status: DC

## 2020-03-20 MED ORDER — CLINDAMYCIN PHOSPHATE 1 % EX SOLN: Freq: Every day | CUTANEOUS | 2 refills | Status: DC

## 2020-03-20 NOTE — Progress Notes (Signed)
   New Patient Visit  Subjective  [name] is a 55 y.o. adult who presents for the following: hx of cellulitis  (Lower legs, pt goes to [facility] for Unna boots when flared), Mass (L arm, 96m, painful), and check spot (Buttocks, ~66yrs, drains, painful, no treatment).  Patient also complains of numbness and pain of the fingers and toes.  New patient referral from Dr. Ladoris Gene.  The following portions of the chart were reviewed this encounter and updated as appropriate:   Tobacco  Allergies  Meds  Problems  Med Hx  Surg Hx  Fam Hx     Review of Systems:  No other skin or systemic complaints except as noted in HPI or Assessment and Plan.  Objective  Well appearing patient in no apparent distress; mood and affect are within normal limits.  A focused examination was performed including bil legs, L arm, buttocks. Relevant physical exam findings are noted in the Assessment and Plan.  Objective  bil lower legs: Erythematous, scaly patches involving the ankle and distal lower leg with associated lower leg edema.   Hyperkeratosis and scale bil feet/lower legs  Images                  Objective  hands, feet: Pain and numbness in hands and feet  Objective  L lat deltoid: Tender nodule  Objective  superior buttocks crease: Pinkness edema, maceration and crusting   Assessment & Plan  Stasis dermatitis of both legs with hyperkeratosis and tinea bil lower legs With Lymphedema Continue Leg Pumps 3 X per day. Start Triamcinolone 0.1% ointment qd to hyperkeratosis and rash of legs Will add Keratolytic (Urea topical) and antiFungal in future.  Stasis in the legs causes chronic leg swelling, which may result in itchy or painful rashes, skin discoloration, skin texture changes, and sometimes ulceration.  Recommend daily compression hose/stockings- easiest to put on first thing in morning, remove at bedtime.  Elevate legs as much as possible.  Avoid salt/sodium rich foods.   Pt has leg pumps that he uses leg pumps 3x/day  Start TMC 0.1% oint qd to feet/lower legs  Consider keratolytic in future, consider antifungal in future No hx of liver problems per patient.  triamcinolone ointment (KENALOG) 0.1 % - bil lower legs  Possible Neuropathy - with symptoms of pain and numbness of feet and hands hands, feet Possible Neuropathy Recommend pt discuss with PCP/Neurologist  Traumatic injury L lat deltoid Vs Glomus tumor vs angiolipoma vs other Recheck on f/u Consider excision if persists.  Intertrigo superior buttocks crease Intertrigo is a chronic recurrent rash that occurs in skin fold areas that may be associated with friction; heat; moisture; yeast; fungus; and bacteria.  It is exacerbated by increased movement / activity; sweating; and higher atmospheric temperature.  Start Ketoconazole 2% cr 3d/wk Monday, Wednesday and Friday Start HC 2.5% lotion 3d/wk Tuesday, Thursday and Saturday Start Clindamycin solution 1d/wk on Sunday  Recommend cleaning area qd  ketoconazole (NIZORAL) 2 % cream - superior buttocks crease hydrocortisone 2.5 % lotion - superior buttocks crease clindamycin (CLEOCIN T) 1 % external solution - superior buttocks crease  Return in about 4 weeks (around 04/17/2020) for F/U stasis derm, Traumatic Injury.   I, Othelia Pulling, RMA, am acting as scribe for Sarina Ser, MD .  Documentation: I have reviewed the above documentation for accuracy and completeness, and I agree with the above.  Sarina Ser, MD

## 2020-03-22 ENCOUNTER — Encounter (INDEPENDENT_AMBULATORY_CARE_PROVIDER_SITE_OTHER): Payer: Self-pay | Admitting: Nurse Practitioner

## 2020-03-22 ENCOUNTER — Ambulatory Visit (INDEPENDENT_AMBULATORY_CARE_PROVIDER_SITE_OTHER): Payer: Medicare HMO

## 2020-03-22 ENCOUNTER — Other Ambulatory Visit: Payer: Self-pay

## 2020-03-22 ENCOUNTER — Ambulatory Visit (INDEPENDENT_AMBULATORY_CARE_PROVIDER_SITE_OTHER): Payer: Medicare HMO | Admitting: Nurse Practitioner

## 2020-03-22 VITALS — BP 114/74 | HR 99 | Resp 16 | Wt 304.8 lb

## 2020-03-22 DIAGNOSIS — M7989 Other specified soft tissue disorders: Secondary | ICD-10-CM

## 2020-03-22 DIAGNOSIS — R600 Localized edema: Secondary | ICD-10-CM | POA: Diagnosis not present

## 2020-03-22 DIAGNOSIS — R200 Anesthesia of skin: Secondary | ICD-10-CM

## 2020-03-22 DIAGNOSIS — M48061 Spinal stenosis, lumbar region without neurogenic claudication: Secondary | ICD-10-CM | POA: Diagnosis not present

## 2020-03-22 DIAGNOSIS — I10 Essential (primary) hypertension: Secondary | ICD-10-CM | POA: Diagnosis not present

## 2020-03-22 DIAGNOSIS — I890 Lymphedema, not elsewhere classified: Secondary | ICD-10-CM

## 2020-03-23 ENCOUNTER — Encounter (INDEPENDENT_AMBULATORY_CARE_PROVIDER_SITE_OTHER): Payer: Self-pay | Admitting: Nurse Practitioner

## 2020-03-23 NOTE — Progress Notes (Signed)
Subjective:    Patient ID: [name], adult    DOB: 1965/03/28, 55 y.o.   MRN: [id number] Chief Complaint  Patient presents with  . Follow-up    Ultrasound follow up    [name] is a 55 year old male that is well-known to our practice.  Since her last office visit she began the process to transition from male to male.  Previously the patient had extensive lower extremity swelling but her swelling looks very good today.  She notes that she has stopped smoking.  She uses her lymphedema pump on a regular basis.  Her biggest complaint is with pain in her lower extremities.  She notes that the pain is constant.  Her toes have no feeling especially when lying down.  She has difficulty with completing activities of daily living that require any extensive walking.  She cannot go for very long distances.  She also notes that she has been losing weight as well.  In 2011 she fell down cement steps and caused issues to her lower spine.  She had an MRI in 2019 which showed:  1. Moderate size central disc extrusion at L5-S1 with mass effect on the thecal sac and probable encroachment on both S1 nerve roots in the lateral recesses. 2. Small central disc protrusion at L4-5. Superimposed on congenital factors, there is mild resulting spinal stenosis and mild narrowing of the lateral recesses, left greater than right. Findings at this level could be symptomatic as well.  She currently goes to pain management which is helpful for controlling her pain but not with the other issues described above.  Today she underwent noninvasive studies which showed no evidence of DVT or superficial venous thrombosis bilaterally.  No evidence of deep venous insufficiency or superficial venous reflux seen bilaterally.  Right ABI is 1.09 with a left of 1.20.  Previous studies in 2020 showed an ABI of 1.01 in the right with a left of 1.03.  Patient has strong triphasic tibial artery waveforms bilaterally with good toe  waveforms bilaterally.   Review of Systems  Cardiovascular: Positive for leg swelling.  Musculoskeletal: Positive for arthralgias and back pain.  Skin: Positive for color change.  Neurological: Positive for numbness.  All other systems reviewed and are negative.      Objective:   Physical Exam Vitals reviewed.  HENT:     Head: Normocephalic.  Cardiovascular:     Rate and Rhythm: Normal rate.     Pulses: Normal pulses.  Pulmonary:     Effort: Pulmonary effort is normal.  Musculoskeletal:     Right lower leg: Edema present.     Left lower leg: Edema present.  Skin:    General: Skin is warm and dry.     Capillary Refill: Capillary refill takes 2 to 3 seconds.  Neurological:     Mental Status: She is alert and oriented to person, place, and time.     Motor: Weakness present.     Gait: Gait abnormal.  Psychiatric:        Mood and Affect: Mood normal.        Behavior: Behavior normal.        Thought Content: Thought content normal.        Judgment: Judgment normal.     BP 114/74 (BP Location: Right Arm)   Pulse 99   Resp 16   Wt (!) 304 lb 12.8 oz (138.3 kg)   BMI 45.01 kg/m   Past Medical History:  Diagnosis Date  .  COPD (chronic obstructive pulmonary disease) ([place])   . Coronary artery disease   . Diabetes mellitus without complication ([place])   . Hyperlipidemia   . Hypertension   . Peptic ulcer   . Sleep apnea     Social History   Socioeconomic History  . Marital status: Single    Spouse name: Not on file  . Number of children: Not on file  . Years of education: Not on file  . Highest education level: Not on file  Occupational History  . Not on file  Tobacco Use  . Smoking status: Former Smoker    Packs/day: 0.75    Years: 30.00    Pack years: 22.50    Types: Cigarettes    Quit date: 08/13/2019    Years since quitting: 0.6  . Smokeless tobacco: Never Used  . Tobacco comment: trying decrease  Vaping Use  . Vaping Use: Never used  Substance and  Sexual Activity  . Alcohol use: No  . Drug use: No  . Sexual activity: Never  Other Topics Concern  . Not on file  Social History Narrative  . Not on file   Social Determinants of Health   Financial Resource Strain: Not on file  Food Insecurity: Not on file  Transportation Needs: Not on file  Physical Activity: Not on file  Stress: Not on file  Social Connections: Not on file  Intimate Partner Violence: Not on file    Past Surgical History:  Procedure Laterality Date  . COLONOSCOPY WITH PROPOFOL N/A 11/09/2016   Procedure: COLONOSCOPY WITH PROPOFOL;  Surgeon: Lollie Sails, MD;  Location: [facility] ENDOSCOPY;  Service: Endoscopy;  Laterality: N/A;  . COLONOSCOPY WITH PROPOFOL N/A 07/06/2017   Procedure: COLONOSCOPY WITH PROPOFOL;  Surgeon: Lollie Sails, MD;  Location: [facility] ENDOSCOPY;  Service: Endoscopy;  Laterality: N/A;  . ESOPHAGOGASTRODUODENOSCOPY (EGD) WITH PROPOFOL N/A 11/09/2016   Procedure: ESOPHAGOGASTRODUODENOSCOPY (EGD) WITH PROPOFOL;  Surgeon: Lollie Sails, MD;  Location: [facility] ENDOSCOPY;  Service: Endoscopy;  Laterality: N/A;  . ESOPHAGOGASTRODUODENOSCOPY (EGD) WITH PROPOFOL N/A 07/06/2017   Procedure: ESOPHAGOGASTRODUODENOSCOPY (EGD) WITH PROPOFOL;  Surgeon: Lollie Sails, MD;  Location: [facility] ENDOSCOPY;  Service: Endoscopy;  Laterality: N/A;    Family History  Problem Relation Age of Onset  . Diabetes Mother   . Diabetes Father   . Stomach cancer Father   . Heart attack Father   . Heart attack Sister   . Heart attack Paternal Aunt     Allergies  Allergen Reactions  . Shellfish Allergy Swelling  . Codeine Rash  . Lisinopril Cough    CBC Latest Ref Rng & Units 06/29/2018 06/28/2018 06/27/2018  WBC 4.0 - 10.5 K/uL 8.2 9.0 8.5  Hemoglobin 13.0 - 17.0 g/dL 12.9(L) 13.6 15.3  Hematocrit 39.0 - 52.0 % 40.6 42.9 47.7  Platelets 150 - 400 K/uL 247 273 312      CMP     Component Value Date/Time   NA 134 (L) 03/23/2019 1453   NA 140  10/07/2017 1604   K 3.8 03/23/2019 1453   CL 102 03/23/2019 1453   CO2 23 03/23/2019 1453   GLUCOSE 128 (H) 03/23/2019 1453   BUN 17 03/23/2019 1453   BUN 10 10/07/2017 1604   CREATININE 1.16 03/23/2019 1453   CALCIUM 9.3 03/23/2019 1453   PROT 7.2 10/15/2017 1953   PROT 6.6 10/07/2017 1604   ALBUMIN 4.0 10/15/2017 1953   ALBUMIN 4.1 10/07/2017 1604   AST 21 10/15/2017 1953   ALT 23  10/15/2017 1953   ALKPHOS 85 10/15/2017 1953   BILITOT 0.6 10/15/2017 1953   BILITOT 0.3 10/07/2017 1604   GFRNONAA >60 03/23/2019 1453   GFRAA >60 03/23/2019 1453     No results found.     Assessment & Plan:   1. Numbness of foot Based on noninvasive studies today the patient's lower extremity numbness is likely related to the issues he has with his lumbar spine.  There is no evidence of vascular abnormalities.  In addition to the referral placed, the patient is also advised to follow-up with PCP for evaluation.  2. Lymphedema The patient has done very well in controlling his lymphedema.  He utilizes his lymphedema pump regularly.  He also elevates his lower extremities as much as possible.  Patient is advised to continue with his conservative activities.  The patient will follow up in 1 year or sooner if issues should arise.  3. Essential hypertension, benign Continue antihypertensive medications as already ordered, these medications have been reviewed and there are no changes at this time.   4. 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  Pain management visit good job of controlling his pain, the patient notes that his ability to do functional day-to-day abilities are declining.  The patient has never had his back formally evaluated for any possible intervention.  We will place a referral to neurosurgery to evaluate if there are any possible options. - Ambulatory referral to Neurosurgery   Current Outpatient Medications on File Prior to  Visit  Medication Sig Dispense Refill  . albuterol (VENTOLIN HFA) 108 (90 Base) MCG/ACT inhaler Inhale 2 puffs into the lungs every 4 (four) hours as needed for wheezing or shortness of breath.     Marland Kitchen aspirin-acetaminophen-caffeine (EXCEDRIN MIGRAINE) 250-250-65 MG tablet Take by mouth every 6 (six) hours as needed for headache.    Marland Kitchen atorvastatin (LIPITOR) 20 MG tablet Take 20 mg by mouth at bedtime.     . clindamycin (CLEOCIN T) 1 % external solution Apply topically daily. Apply to aa rash on buttocks 1 time a week on sundays 60 mL 2  . estradiol (ESTRACE) 2 MG tablet Take by mouth.    . gabapentin (NEURONTIN) 300 MG capsule Take 300 mg by mouth 3 (three) times daily.     . hydrochlorothiazide (MICROZIDE) 12.5 MG capsule Take 12.5 mg by mouth daily.   0  . hydrocortisone 2.5 % lotion Apply topically as directed. Apply to aa rash on buttocks 3 days per week Tuesday, Thursday, [place], prn flares 59 mL 2  . ketoconazole (NIZORAL) 2 % cream Apply 1 application topically as directed. Apply to aa rash on buttock 3 days a week on Monday, Wednesday and Fridays 60 g 3  . loratadine (CLARITIN) 10 MG tablet Take 10 mg by mouth daily.    Marland Kitchen losartan (COZAAR) 50 MG tablet Take 1 tablet (50 mg total) by mouth daily. 30 tablet 0  . metFORMIN (GLUCOPHAGE) 1000 MG tablet Take 1,000 mg by mouth 2 (two) times daily with a meal.    . oxybutynin (DITROPAN) 5 MG tablet Take 5 mg by mouth every 12 (twelve) hours.    . pantoprazole (PROTONIX) 40 MG tablet Take 40 mg by mouth 2 (two) times daily.     . phentermine 37.5 MG capsule Take 37.5 mg by mouth every morning.    Marland Kitchen spironolactone (ALDACTONE) 50 MG tablet Take 50 mg by mouth 2 (two) times daily.    Marland Kitchen  triamcinolone ointment (KENALOG) 0.1 % Apply 1 application topically at bedtime. Qd to feet and lower legs, avoid face, groin, axilla 80 g 2  . TRUEPLUS 5-BEVEL PEN NEEDLES 31G X 6 MM MISC     . umeclidinium-vilanterol (ANORO ELLIPTA) 62.5-25 MCG/INH AEPB Inhale into  the lungs.    Marland Kitchen VICTOZA 18 MG/3ML SOPN daily.     Marland Kitchen buPROPion (WELLBUTRIN XL) 300 MG 24 hr tablet Take 300 mg by mouth daily.    Marland Kitchen escitalopram (LEXAPRO) 20 MG tablet Take by mouth.     . furosemide (LASIX) 20 MG tablet Take 1 tablet (20 mg total) by mouth daily. 14 tablet 0  . Magnesium 500 MG CAPS Take 1 capsule (500 mg total) by mouth at bedtime. 90 capsule 0  . meloxicam (MOBIC) 15 MG tablet Take 1 tablet (15 mg total) by mouth daily. 90 tablet 0  . nortriptyline (PAMELOR) 25 MG capsule Take 25 mg by mouth at bedtime.    Marland Kitchen tiZANidine (ZANAFLEX) 4 MG tablet Take 1 tablet (4 mg total) by mouth every 8 (eight) hours as needed for muscle spasms. 90 tablet 2   No current facility-administered medications on file prior to visit.    There are no Patient Instructions on file for this visit. No follow-ups on file.   Kris Hartmann, NP
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
## 2020-04-08 ENCOUNTER — Other Ambulatory Visit: Payer: Self-pay

## 2020-04-08 ENCOUNTER — Encounter: Payer: Self-pay | Admitting: Psychiatry

## 2020-04-08 ENCOUNTER — Ambulatory Visit (INDEPENDENT_AMBULATORY_CARE_PROVIDER_SITE_OTHER): Payer: Medicare HMO | Admitting: Psychiatry

## 2020-04-08 VITALS — BP 143/78 | HR 121 | Temp 96.8°F | Wt 296.8 lb

## 2020-04-08 DIAGNOSIS — F331 Major depressive disorder, recurrent, moderate: Secondary | ICD-10-CM

## 2020-04-08 MED ORDER — ARIPIPRAZOLE 2 MG PO TABS: 2.0000 mg | ORAL_TABLET | Freq: Every day | ORAL | 1 refills | Status: DC

## 2020-04-08 NOTE — Patient Instructions (Addendum)
1. Continue lexapro 20 mg daily  2. Start Abilify 2 mg daily  CONTACT INFORMATION  What to do if you need to get in touch with someone regarding a psychiatric issue:  1. EMERGENCY: For psychiatric emergencies (if you are suicidal or if there are any other safety issues) call 911 and/or go to your nearest Emergency Room immediately.   2. IF YOU NEED SOMEONE TO TALK TO RIGHT NOW: Given my clinical responsibilities, I may not be able to speak with you over the phone for a prolonged period of time.  A. You may always call The National Suicide Prevention Lifeline at 1-800-273-TALK 872-296-0361).  B. You may walk in to [facility]  Address: [address] [place], [place] [address], Phone: [phone].  Open 24/7, No appointment required.

## 2020-04-22 ENCOUNTER — Encounter: Payer: Self-pay | Admitting: Podiatry

## 2020-04-22 ENCOUNTER — Ambulatory Visit (INDEPENDENT_AMBULATORY_CARE_PROVIDER_SITE_OTHER): Payer: Medicare HMO | Admitting: Podiatry

## 2020-04-22 ENCOUNTER — Other Ambulatory Visit: Payer: Self-pay

## 2020-04-22 DIAGNOSIS — M79674 Pain in right toe(s): Secondary | ICD-10-CM | POA: Diagnosis not present

## 2020-04-22 DIAGNOSIS — M79675 Pain in left toe(s): Secondary | ICD-10-CM | POA: Diagnosis not present

## 2020-04-22 DIAGNOSIS — I890 Lymphedema, not elsewhere classified: Secondary | ICD-10-CM

## 2020-04-22 DIAGNOSIS — B351 Tinea unguium: Secondary | ICD-10-CM | POA: Diagnosis not present

## 2020-04-22 DIAGNOSIS — I872 Venous insufficiency (chronic) (peripheral): Secondary | ICD-10-CM

## 2020-04-22 DIAGNOSIS — E1151 Type 2 diabetes mellitus with diabetic peripheral angiopathy without gangrene: Secondary | ICD-10-CM

## 2020-04-22 NOTE — Progress Notes (Signed)
This patient returns to my office for at risk foot care.  This patient requires this care by a professional since this patient will be at risk due to having lymphedema and diabetes.   This patient is unable to cut nails himself since the patient cannot reach his nails.These nails are painful walking and wearing shoes.  This patient presents for at risk foot care today.  General Appearance  Alert, conversant and in no acute stress.  Vascular  Dorsalis pedis and posterior tibial  pulses are weakly  palpable   bilaterally.  Capillary return is diminished  bilaterally. Temperature is within normal limits  bilaterally.  Neurologic  Senn-Weinstein monofilament wire test within normal limits  bilaterally. Muscle power within normal limits bilaterally.  Nails Thick disfigured discolored nails with subungual debris  from hallux to fifth toes bilaterally. No evidence of bacterial infection or drainage bilaterally.  Orthopedic  No limitations of motion  feet .  No crepitus or effusions noted.  No bony pathology or digital deformities noted.  Skin   No  porokeratosis noted bilaterally.  No signs of infections or ulcers noted.   Rough hard skin interdigitally left foot.  Onychomycosis  Pain in right toes  Pain in left toes  Consent was obtained for treatment procedures.   Mechanical debridement of nails 1-5  bilaterally performed with a nail nipper.  Filed with dremel without incident.   Return office visit    3 months                  Told patient to return for periodic foot care and evaluation due to potential at risk complications.   Gardiner Barefoot DPM

## 2020-04-24 ENCOUNTER — Other Ambulatory Visit: Payer: Self-pay

## 2020-04-24 ENCOUNTER — Encounter: Payer: Self-pay | Admitting: Dermatology

## 2020-04-24 ENCOUNTER — Ambulatory Visit (INDEPENDENT_AMBULATORY_CARE_PROVIDER_SITE_OTHER): Payer: Medicare HMO | Admitting: Dermatology

## 2020-04-24 DIAGNOSIS — B354 Tinea corporis: Secondary | ICD-10-CM

## 2020-04-24 DIAGNOSIS — I872 Venous insufficiency (chronic) (peripheral): Secondary | ICD-10-CM

## 2020-04-24 DIAGNOSIS — B359 Dermatophytosis, unspecified: Secondary | ICD-10-CM | POA: Diagnosis not present

## 2020-04-24 DIAGNOSIS — L304 Erythema intertrigo: Secondary | ICD-10-CM

## 2020-04-24 DIAGNOSIS — I890 Lymphedema, not elsewhere classified: Secondary | ICD-10-CM | POA: Diagnosis not present

## 2020-04-24 DIAGNOSIS — D1800 Hemangioma unspecified site: Secondary | ICD-10-CM

## 2020-04-24 MED ORDER — FLUCONAZOLE 200 MG PO TABS: 200.0000 mg | ORAL_TABLET | ORAL | 0 refills | Status: DC

## 2020-04-24 MED ORDER — UREA 40 % EX LOTN: 1.0000 "application " | TOPICAL_LOTION | CUTANEOUS | 3 refills | Status: DC

## 2020-04-24 NOTE — Patient Instructions (Addendum)
If you have any questions or concerns for your doctor, please call our main line at [phone] and press option 4 to reach your doctor's medical assistant. If no one answers, please leave a voicemail as directed and we will return your call as soon as possible. Messages left after 4 pm will be answered the following business day.   You may also send [place] a message via [place]. We typically respond to MyChart messages within 1-2 business days.  For prescription refills, please ask your pharmacy to contact our office. Our fax number is [phone].  If you have an urgent issue when the clinic is closed that cannot wait until the next business day, you can page your doctor at the number below.    Please note that while we do our best to be available for urgent issues outside of office hours, we are not available 24/7.   If you have an urgent issue and are unable to reach [place], you may choose to seek medical care at your doctor's office, retail clinic, urgent care center, or emergency room.  If you have a medical emergency, please immediately call 911 or go to the emergency department.  Pager Numbers  - Dr. Nehemiah Massed: [phone]  - Dr. Laurence Ferrari: [phone]  - Dr. Nicole Kindred: [phone]  In the event of inclement weather, please call our main line at [phone] for an update on the status of any delays or closures.  Dermatology Medication Tips: Please keep the boxes that topical medications come in in order to help keep track of the instructions about where and how to use these. Pharmacies typically print the medication instructions only on the boxes and not directly on the medication tubes.   If your medication is too expensive, please contact our office at [phone] option 4 or send [place] a message through [place].   We are unable to tell what your co-pay for medications will be in advance as this is different depending on your insurance coverage. However, we may be able to find a substitute  medication at lower cost or fill out paperwork to get insurance to cover a needed medication.   If a prior authorization is required to get your medication covered by your insurance company, please allow [place] 1-2 business days to complete this process.  Drug prices often vary depending on where the prescription is filled and some pharmacies may offer cheaper prices.  The website www.goodrx.com contains coupons for medications through different pharmacies. The prices here do not account for what the cost may be with help from insurance (it may be cheaper with your insurance), but the website can give you the price if you did not use any insurance.  - You can print the associated coupon and take it with your prescription to the pharmacy.  - You may also stop by our office during regular business hours and pick up a GoodRx coupon card.  - If you need your prescription sent electronically to a different pharmacy, notify our office through [facility] or by phone at [phone] option 4.   For Legs Decrease the Triamcinolone 0.1% ointment to 3 days a week to legs Start Urea lotion to legs every other day alternating with Triamcinolone 0.1% ointment

## 2020-04-24 NOTE — Progress Notes (Signed)
   Follow-Up Visit   Subjective  [name] is a 55 y.o. 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  The following portions of the chart were reviewed this encounter and updated as appropriate:   Tobacco  Allergies  Meds  Problems  Med Hx  Surg Hx  Fam Hx     Review of Systems:  No other skin or systemic complaints except as noted in HPI or Assessment and Plan.  Objective  Well appearing patient in no apparent distress; mood and affect are within normal limits.  A focused examination was performed including bil legs, R shoulder. Relevant physical exam findings are noted in the Assessment and Plan.  Objective  bil lower legs, feet: Hyperkeratosis and scale bil feet and lower legs  Objective  Right Lower Leg - Anterior: Erythematous, scaly patches involving the ankle and distal lower leg with associated lower leg edema  Objective  Left lat deltoid: Tender 0.7 cm deep paps  Objective  buttocks crease: Clear today per patient   Assessment & Plan  Tinea corporis bil lower legs, feet Chronic; persistent Start Diflucan 200mg  1 po 3d/wk x 1 month  fluconazole (DIFLUCAN) 200 MG tablet - bil lower legs, feet  Stasis dermatitis of both legs Right Lower Leg - Anterior Stasis dermatitis of both legs with hyperkeratosis and tinea and Lymphedema Improving on treatment bil lower legs With Lymphedema Improved  Decrease TMC 0.1% ointment to qod Start Urea 40% cr qod alternating with TMC 0.1% ointment Cont Leg pumps 3x/day  Stasis in the legs causes chronic leg swelling, which may result in itchy or painful rashes, skin discoloration, skin texture changes, and sometimes ulceration.  Recommend daily compression hose/stockings- easiest to put on first thing in  morning, remove at bedtime.  Elevate legs as much as possible. Avoid salt/sodium rich foods.   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  It is exacerbated by increased movement / activity; sweating; and higher atmospheric temperature.  Improved per patient  Cont Ketoconazole 2% cr 3d/wk Monday, Wednesday, Fridays Cont HC 2.5%  lotion 3d/wk prn flares then d/c Cont Clindamycin sol 1d/wk Sundays   Other Related Medications ketoconazole (NIZORAL) 2 % cream hydrocortisone 2.5 % lotion clindamycin (CLEOCIN T) 1 % external solution  Return in about 5 weeks (around 05/29/2020).  I, Othelia Pulling, RMA, am acting as scribe for Sarina Ser, MD .  Documentation: I have reviewed the above documentation for accuracy and completeness, and I agree with the above.  Sarina Ser, MD
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
## 2020-05-20 ENCOUNTER — Encounter: Payer: Self-pay | Admitting: Psychiatry

## 2020-05-20 ENCOUNTER — Other Ambulatory Visit: Payer: Self-pay

## 2020-05-20 ENCOUNTER — Ambulatory Visit (INDEPENDENT_AMBULATORY_CARE_PROVIDER_SITE_OTHER): Payer: Medicare HMO | Admitting: Psychiatry

## 2020-05-20 VITALS — BP 127/68 | HR 111 | Temp 97.9°F | Wt 302.8 lb

## 2020-05-20 DIAGNOSIS — F331 Major depressive disorder, recurrent, moderate: Secondary | ICD-10-CM

## 2020-05-20 MED ORDER — BUPROPION HCL ER (XL) 150 MG PO TB24: 150.0000 mg | ORAL_TABLET | Freq: Every day | ORAL | 2 refills | Status: DC

## 2020-05-20 MED ORDER — ARIPIPRAZOLE 2 MG PO TABS: 2.0000 mg | ORAL_TABLET | Freq: Every day | ORAL | 1 refills | Status: DC

## 2020-05-20 NOTE — Patient Instructions (Signed)
1. Continue lexapro 20 mg daily  2. Continue Abilify 2 mg daily 3. Start bupropion 150 mg daily  4. Return in 2 months, in person

## 2020-05-30 ENCOUNTER — Ambulatory Visit (INDEPENDENT_AMBULATORY_CARE_PROVIDER_SITE_OTHER): Payer: Medicare HMO | Admitting: Dermatology

## 2020-05-30 ENCOUNTER — Other Ambulatory Visit: Payer: Self-pay

## 2020-05-30 DIAGNOSIS — L304 Erythema intertrigo: Secondary | ICD-10-CM

## 2020-05-30 DIAGNOSIS — B354 Tinea corporis: Secondary | ICD-10-CM | POA: Diagnosis not present

## 2020-05-30 DIAGNOSIS — I872 Venous insufficiency (chronic) (peripheral): Secondary | ICD-10-CM | POA: Diagnosis not present

## 2020-05-30 MED ORDER — FLUCONAZOLE 200 MG PO TABS: 200.0000 mg | ORAL_TABLET | ORAL | 0 refills | Status: DC

## 2020-05-30 MED ORDER — UREA 35 % EX CREA: 1.0000 "application " | TOPICAL_CREAM | CUTANEOUS | 3 refills | Status: DC

## 2020-05-30 NOTE — Progress Notes (Signed)
   Follow-Up Visit   Subjective  [name] is a 55 y.o. adult who presents for the following: Stasis dermatitis  (Bil lower legs, TMC 0.1% oint qhs), intertrigo (Buttocks, Ketoconazole 2% cr 3d/wk, HC 2.5% lotion 3d/wk), and Tinea (Bil lower legs/feet, Diflucan 200mg  1 po 3x/wk x 49m (pt has 1 day left)).  The following portions of the chart were reviewed this encounter and updated as appropriate:   Tobacco  Allergies  Meds  Problems  Med Hx  Surg Hx  Fam Hx     Review of Systems:  No other skin or systemic complaints except as noted in HPI or Assessment and Plan.  Objective  Well appearing patient in no apparent distress; mood and affect are within normal limits.  A focused examination was performed including bil legs. Relevant physical exam findings are noted in the Assessment and Plan.  Objective  bil legs: Hyperpigmentation edema woody induration and hyperkeratosis  Objective  Buttocks crease: Clear today per patient  Objective  bil lower legs, feet: Hyperkeratosis and scale bil feet and lower legs   Assessment & Plan  Stasis dermatitis of both legs -improving but persistent and not to goal bil legs With Hyperkeratosis and Tinea and Lymphedema  Stasis in the legs causes chronic leg swelling, which may result in itchy or painful rashes, skin discoloration, skin texture changes, and sometimes ulceration.  Recommend daily compression hose/stockings- easiest to put on first thing in morning, remove at bedtime.  Elevate legs as much as possible. Avoid salt/sodium rich foods.   Decrease TMC 0.1% oint to qod to lower legs Start Urea 35% cream qod to lower legs Cont Leg pumps 3x/day  Urea 35 % CREA - bil legs  Other Related Medications triamcinolone ointment (KENALOG) 0.1 % Urea 40 % LOTN  Intertrigo Buttocks crease Intertrigo is a chronic recurrent rash that occurs in skin fold areas that may be associated with friction; heat; moisture; yeast; fungus; and  bacteria.  It is exacerbated by increased movement / activity; sweating; and higher atmospheric temperature.  Cont Ketoconazole 2 % cr qod to aa Cont HC 2.5% lotion qod prn flares  Other Related Medications hydrocortisone 2.5 % lotion clindamycin (CLEOCIN T) 1 % external solution  Tinea corporis bil lower legs, feet Chronic, persistent Improved Cont Diflucan 200mg  1 po 3x/wk for one more month.  Reordered Medications fluconazole (DIFLUCAN) 200 MG tablet  Return in about 4 months (around 09/30/2020) for stasis derm, tinea, intertrigo.   I, Othelia Pulling, RMA, am acting as scribe for Sarina Ser, MD .  Documentation: I have reviewed the above documentation for accuracy and completeness, and I agree with the above.  Sarina Ser, MD

## 2020-05-30 NOTE — Patient Instructions (Addendum)
If you have any questions or concerns for your doctor, please call our main line at [phone] and press option 4 to reach your doctor's medical assistant. If no one answers, please leave a voicemail as directed and we will return your call as soon as possible. Messages left after 4 pm will be answered the following business day.   You may also send [place] a message via [place]. We typically respond to MyChart messages within 1-2 business days.  For prescription refills, please ask your pharmacy to contact our office. Our fax number is [phone].  If you have an urgent issue when the clinic is closed that cannot wait until the next business day, you can page your doctor at the number below.    Please note that while we do our best to be available for urgent issues outside of office hours, we are not available 24/7.   If you have an urgent issue and are unable to reach [place], you may choose to seek medical care at your doctor's office, retail clinic, urgent care center, or emergency room.  If you have a medical emergency, please immediately call 911 or go to the emergency department.  Pager Numbers  - Dr. Nehemiah Massed: [phone]  - Dr. Laurence Ferrari: [phone]  - Dr. Nicole Kindred: [phone]  In the event of inclement weather, please call our main line at [phone] for an update on the status of any delays or closures.  Dermatology Medication Tips: Please keep the boxes that topical medications come in in order to help keep track of the instructions about where and how to use these. Pharmacies typically print the medication instructions only on the boxes and not directly on the medication tubes.   If your medication is too expensive, please contact our office at [phone] option 4 or send [place] a message through [place].   We are unable to tell what your co-pay for medications will be in advance as this is different depending on your insurance coverage. However, we may be able to find a substitute  medication at lower cost or fill out paperwork to get insurance to cover a needed medication.   If a prior authorization is required to get your medication covered by your insurance company, please allow [place] 1-2 business days to complete this process.  Drug prices often vary depending on where the prescription is filled and some pharmacies may offer cheaper prices.  The website www.goodrx.com contains coupons for medications through different pharmacies. The prices here do not account for what the cost may be with help from insurance (it may be cheaper with your insurance), but the website can give you the price if you did not use any insurance.  - You can print the associated coupon and take it with your prescription to the pharmacy.  - You may also stop by our office during regular business hours and pick up a GoodRx coupon card.  - If you need your prescription sent electronically to a different pharmacy, notify our office through [facility] or by phone at [phone] option 4.    Start Urea cream 35% every other day to lower legs Decrease Triamcinolone ointment every other day to lower legs

## 2020-06-07 ENCOUNTER — Encounter: Payer: Self-pay | Admitting: Dermatology

## 2020-07-16 ENCOUNTER — Other Ambulatory Visit: Payer: Self-pay | Admitting: Dermatology

## 2020-07-16 DIAGNOSIS — L304 Erythema intertrigo: Secondary | ICD-10-CM

## 2020-07-16 DIAGNOSIS — I872 Venous insufficiency (chronic) (peripheral): Secondary | ICD-10-CM
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
## 2020-07-22 ENCOUNTER — Emergency Department: Payer: Medicare Other

## 2020-07-22 ENCOUNTER — Encounter: Payer: Self-pay | Admitting: Emergency Medicine

## 2020-07-22 ENCOUNTER — Ambulatory Visit: Payer: Medicare HMO | Admitting: Psychiatry

## 2020-07-22 ENCOUNTER — Emergency Department
Admission: EM | Admit: 2020-07-22 | Discharge: 2020-07-22 | Disposition: A | Discharge status: Home / Self Care | Payer: Medicare Other | Attending: Emergency Medicine | Admitting: Emergency Medicine

## 2020-07-22 ENCOUNTER — Other Ambulatory Visit: Payer: Self-pay

## 2020-07-22 ENCOUNTER — Ambulatory Visit: Payer: Medicare HMO | Admitting: Podiatry

## 2020-07-22 DIAGNOSIS — E119 Type 2 diabetes mellitus without complications: Secondary | ICD-10-CM | POA: Insufficient documentation

## 2020-07-22 DIAGNOSIS — Z23 Encounter for immunization: Secondary | ICD-10-CM | POA: Insufficient documentation

## 2020-07-22 DIAGNOSIS — I2510 Atherosclerotic heart disease of native coronary artery without angina pectoris: Secondary | ICD-10-CM | POA: Insufficient documentation

## 2020-07-22 DIAGNOSIS — Z7984 Long term (current) use of oral hypoglycemic drugs: Secondary | ICD-10-CM | POA: Diagnosis not present

## 2020-07-22 DIAGNOSIS — Z7951 Long term (current) use of inhaled steroids: Secondary | ICD-10-CM | POA: Diagnosis not present

## 2020-07-22 DIAGNOSIS — Z794 Long term (current) use of insulin: Secondary | ICD-10-CM | POA: Insufficient documentation

## 2020-07-22 DIAGNOSIS — Z79899 Other long term (current) drug therapy: Secondary | ICD-10-CM | POA: Insufficient documentation

## 2020-07-22 DIAGNOSIS — J449 Chronic obstructive pulmonary disease, unspecified: Secondary | ICD-10-CM | POA: Diagnosis not present

## 2020-07-22 DIAGNOSIS — E860 Dehydration: Secondary | ICD-10-CM | POA: Diagnosis not present

## 2020-07-22 DIAGNOSIS — Z20822 Contact with and (suspected) exposure to covid-19: Secondary | ICD-10-CM | POA: Diagnosis not present

## 2020-07-22 DIAGNOSIS — R531 Weakness: Secondary | ICD-10-CM | POA: Diagnosis present

## 2020-07-22 DIAGNOSIS — L03115 Cellulitis of right lower limb: Secondary | ICD-10-CM | POA: Insufficient documentation

## 2020-07-22 DIAGNOSIS — Z87891 Personal history of nicotine dependence: Secondary | ICD-10-CM | POA: Diagnosis not present

## 2020-07-22 DIAGNOSIS — L03119 Cellulitis of unspecified part of limb: Secondary | ICD-10-CM

## 2020-07-22 DIAGNOSIS — L03116 Cellulitis of left lower limb: Secondary | ICD-10-CM | POA: Diagnosis not present

## 2020-07-22 DIAGNOSIS — R600 Localized edema: Secondary | ICD-10-CM | POA: Insufficient documentation

## 2020-07-22 DIAGNOSIS — I10 Essential (primary) hypertension: Secondary | ICD-10-CM | POA: Insufficient documentation

## 2020-07-22 LAB — URINALYSIS, COMPLETE (UACMP) WITH MICROSCOPIC
Bacteria, UA: NONE SEEN
Bilirubin Urine: NEGATIVE
Glucose, UA: NEGATIVE mg/dL
Hgb urine dipstick: NEGATIVE
Ketones, ur: NEGATIVE mg/dL
Leukocytes,Ua: NEGATIVE
Nitrite: NEGATIVE
Protein, ur: NEGATIVE mg/dL
Specific Gravity, Urine: 1.013 (ref 1.005–1.030)
Squamous Epithelial / HPF: NONE SEEN (ref 0–5)
pH: 6 (ref 5.0–8.0)

## 2020-07-22 LAB — COMPREHENSIVE METABOLIC PANEL
ALT: 16 U/L (ref 0–44)
AST: 21 U/L (ref 15–41)
Albumin: 3.5 g/dL (ref 3.5–5.0)
Alkaline Phosphatase: 52 U/L (ref 38–126)
Anion gap: 6 (ref 5–15)
BUN: 22 mg/dL — ABNORMAL HIGH (ref 6–20)
CO2: 28 mmol/L (ref 22–32)
Calcium: 9 mg/dL (ref 8.9–10.3)
Chloride: 103 mmol/L (ref 98–111)
Creatinine, Ser: 1.22 mg/dL (ref 0.61–1.24)
GFR, Estimated: >60 mL/min (ref >60)
Glucose, Bld: 98 mg/dL (ref 70–99)
Potassium: 4.4 mmol/L (ref 3.5–5.1)
Sodium: 137 mmol/L (ref 135–145)
Total Bilirubin: 0.7 mg/dL (ref 0.3–1.2)
Total Protein: 6.4 g/dL — ABNORMAL LOW (ref 6.5–8.1)

## 2020-07-22 LAB — RESP PANEL BY RT-PCR (FLU A&B, COVID) ARPGX2
Influenza A by PCR: NEGATIVE
Influenza B by PCR: NEGATIVE
SARS Coronavirus 2 by RT PCR: NEGATIVE

## 2020-07-22 LAB — CBC WITH DIFFERENTIAL/PLATELET
Abs Immature Granulocytes: 0.06 10*3/uL (ref 0.00–0.07)
Basophils Absolute: 0 10*3/uL (ref 0.0–0.1)
Basophils Relative: 0 %
Eosinophils Absolute: 0.3 10*3/uL (ref 0.0–0.5)
Eosinophils Relative: 4 %
HCT: 35.9 % — ABNORMAL LOW (ref 39.0–52.0)
Hemoglobin: 11.9 g/dL — ABNORMAL LOW (ref 13.0–17.0)
Immature Granulocytes: 1 %
Lymphocytes Relative: 14 %
Lymphs Abs: 1.1 10*3/uL (ref 0.7–4.0)
MCH: 29.7 pg (ref 26.0–34.0)
MCHC: 33.1 g/dL (ref 30.0–36.0)
MCV: 89.5 fL (ref 80.0–100.0)
Monocytes Absolute: 0.7 10*3/uL (ref 0.1–1.0)
Monocytes Relative: 8 %
Neutro Abs: 5.6 10*3/uL (ref 1.7–7.7)
Neutrophils Relative %: 73 %
Platelets: 234 10*3/uL (ref 150–400)
RBC: 4.01 MIL/uL — ABNORMAL LOW (ref 4.22–5.81)
RDW: 13.2 % (ref 11.5–15.5)
WBC: 7.7 10*3/uL (ref 4.0–10.5)
nRBC: 0 % (ref 0.0–0.2)

## 2020-07-22 LAB — CK: Total CK: 197 U/L (ref 49–397)

## 2020-07-22 LAB — TSH: TSH: 3.35 u[IU]/mL (ref 0.350–4.500)

## 2020-07-22 LAB — MAGNESIUM: Magnesium: 2 mg/dL (ref 1.7–2.4)

## 2020-07-22 LAB — TROPONIN I (HIGH SENSITIVITY): Troponin I (High Sensitivity): 4 ng/L (ref <18)

## 2020-07-22 LAB — LACTIC ACID, PLASMA: Lactic Acid, Venous: 1.8 mmol/L (ref 0.5–1.9)

## 2020-07-22 LAB — PROCALCITONIN: Procalcitonin: <0.1 ng/mL

## 2020-07-22 MED ORDER — LACTATED RINGERS IV BOLUS
1000.0000 mL | Freq: Once | INTRAVENOUS | Status: AC
  Administered 2020-07-22: 1000 mL via INTRAVENOUS

## 2020-07-22 MED ORDER — DOXYCYCLINE HYCLATE 100 MG PO CAPS: 100.0000 mg | ORAL_CAPSULE | Freq: Two times a day (BID) | ORAL | 0 refills | Status: AC

## 2020-07-22 MED ORDER — TETANUS-DIPHTH-ACELL PERTUSSIS 5-2.5-18.5 LF-MCG/0.5 IM SUSY
0.5000 mL | PREFILLED_SYRINGE | Freq: Once | INTRAMUSCULAR | Status: AC
  Administered 2020-07-22: 0.5 mL via INTRAMUSCULAR
  Filled 2020-07-22: qty 0.5

## 2020-07-22 NOTE — ED Provider Notes (Signed)
[facility] Emergency Department Provider Note  ____________________________________________   Event Date/Time   First MD Initiated Contact with Patient 07/22/20 [phone]     (approximate)  I have reviewed the triage vital signs and the nursing notes.   HISTORY  Chief Complaint Weakness   HPI [name] is a 55 y.o. adult transgender born biologically male identifying as male with a history of COPD, CAD, DM, HTN, HDL, peptic ulcer disease and OSA who presents from home for assessment of generalized weakness and feeling like her blood pressure was low.  She does note she and her right leg against a wheelbarrow 2 days ago when the symptoms began and since then there has been increased redness pain and some swelling on both lower legs perhaps a little more on the left.  This is consistent with skin infection she has had in the past.  She states her blood pressure felt low this morning but was not specifically measured.  No fevers, chest pain, cough, shortness of breath, dizziness, urinary symptoms, nausea, vomiting, diarrhea or dysuria, other areas of redness or any other recent injuries or falls.  No other acute concerns at this time.         Past Medical History:  Diagnosis Date   COPD (chronic obstructive pulmonary disease) ([place])    Coronary artery disease    Diabetes mellitus without complication ([place])    Hyperlipidemia    Hypertension    Peptic ulcer    Sleep apnea     Patient Active Problem List   Diagnosis Date Noted   Venous stasis dermatitis of both lower extremities 08/17/2019   Pain due to onychomycosis of toenails of both feet 05/15/2019   Chronic low back pain (1ry area of Pain) (Bilateral) w/o sciatica 03/09/2019   Bilateral lower leg cellulitis 06/27/2018   Osteoarthritis of facet joint of lumbar spine 02/14/2018   Osteoarthritis involving multiple joints 02/14/2018   Chronic musculoskeletal pain 02/14/2018   Neurogenic pain  02/14/2018   Abnormal MRI, lumbar spine 02/14/2018   History of allergy to shellfish 01/27/2018   Morbid obesity with BMI of 45.0-49.9, adult ([place]) 12/13/2017   Swelling of limb 11/26/2017   Lower limb ulcer, calf, left, limited to breakdown of skin ([place]) 11/26/2017   DM2 (diabetes mellitus, type 2) ([place]) 11/03/2017   Neurogenic bladder 11/03/2017   Spondylosis without myelopathy or radiculopathy, lumbar region 11/03/2017   Lumbar facet syndrome (Bilateral) (R>L) 11/03/2017   DDD (degenerative disc disease), lumbar 11/02/2017   Lumbar facet hypertrophy (Bilateral) 11/02/2017   Lumbar central spinal stenosis (L4-5) 11/02/2017   Lumbar lateral recess stenosis (L4-5) (Bilateral) (L>R) 11/02/2017   Vitamin D insufficiency 11/02/2017   Lumbar spondylosis 11/02/2017   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   Grief 10/18/2017   Chronic low back pain (Bilateral) (R>L) w/ sciatica (Bilateral) 10/07/2017   Chronic lower extremity pain (Secondary Area of Pain) (Bilateral) (R>L) 10/07/2017   Chronic pain syndrome 10/07/2017   Pharmacologic therapy 10/07/2017   Disorder of skeletal system 10/07/2017   Problems influencing health status 10/07/2017   Barrett's esophagus without dysplasia 03/10/2017   Colon cancer screening 10/14/2016   Essential hypertension, benign 02/11/2016   Hyperlipidemia 02/11/2016   Tobacco dependence 02/11/2016   Obesity 02/11/2016   Lower limb ulcer, ankle, left, with fat layer exposed ([place]) 02/11/2016   Lymphedema 02/11/2016   Cellulitis 01/06/2016    Past Surgical History:  Procedure Laterality Date   COLONOSCOPY WITH PROPOFOL N/A  11/09/2016   Procedure: COLONOSCOPY WITH PROPOFOL;  Surgeon: Lollie Sails, MD;  Location: [facility] ENDOSCOPY;  Service: Endoscopy;  Laterality: N/A;   COLONOSCOPY WITH PROPOFOL N/A 07/06/2017   Procedure: COLONOSCOPY WITH PROPOFOL;  Surgeon: Lollie Sails, MD;  Location: [facility] ENDOSCOPY;   Service: Endoscopy;  Laterality: N/A;   ESOPHAGOGASTRODUODENOSCOPY (EGD) WITH PROPOFOL N/A 11/09/2016   Procedure: ESOPHAGOGASTRODUODENOSCOPY (EGD) WITH PROPOFOL;  Surgeon: Lollie Sails, MD;  Location: [facility] ENDOSCOPY;  Service: Endoscopy;  Laterality: N/A;   ESOPHAGOGASTRODUODENOSCOPY (EGD) WITH PROPOFOL N/A 07/06/2017   Procedure: ESOPHAGOGASTRODUODENOSCOPY (EGD) WITH PROPOFOL;  Surgeon: Lollie Sails, MD;  Location: [facility] ENDOSCOPY;  Service: Endoscopy;  Laterality: N/A;    Prior to Admission medications   Medication Sig Start Date End Date Taking? Authorizing Provider  doxycycline (VIBRAMYCIN) 100 MG capsule Take 1 capsule (100 mg total) by mouth 2 (two) times daily for 7 days. 07/22/20 07/29/20 Yes Lucrezia Starch, MD  albuterol (VENTOLIN HFA) 108 (90 Base) MCG/ACT inhaler Inhale 2 puffs into the lungs every 4 (four) hours as needed for wheezing or shortness of breath.     [provider]  ARIPiprazole (ABILIFY) 2 MG tablet Take 1 tablet (2 mg total) by mouth daily. 06/08/20 08/07/20  Norman Clay, MD  aspirin-acetaminophen-caffeine (EXCEDRIN MIGRAINE) [phone] MG tablet Take by mouth every 6 (six) hours as needed for headache.    [provider]  atorvastatin (LIPITOR) 20 MG tablet Take 20 mg by mouth at bedtime.     [provider]  buPROPion (WELLBUTRIN SR) 150 MG 12 hr tablet Take by mouth. Patient not taking: Reported on 05/30/2020    [provider]  buPROPion (WELLBUTRIN XL) 150 MG 24 hr tablet Take 1 tablet (150 mg total) by mouth daily. 05/20/20 08/18/20  Norman Clay, MD  Calcium-Magnesium-Vitamin D (CALCIUM 1200+D3 PO) Take by mouth.    [provider]  Cholecalciferol (VITAMIN D) 125 MCG (5000 UT) CAPS Take by mouth.    [provider]  clindamycin (CLEOCIN T) 1 % external solution APPLY TO AFFECTED AREA RASH ON BUTTOCKS1 TIME A WEEK ON SUNDAYS 07/16/20   Ralene Bathe, MD  escitalopram (LEXAPRO) 20 MG tablet Take by  mouth.  10/06/19 01/04/20  [provider]  estradiol (ESTRACE) 2 MG tablet Take by mouth. 03/13/20   [provider]  fluconazole (DIFLUCAN) 200 MG tablet Take 1 tablet (200 mg total) by mouth 3 (three) times a week. 05/31/20   Ralene Bathe, MD  gabapentin (NEURONTIN) 300 MG capsule Take 300 mg by mouth 3 (three) times daily.  09/20/18   [provider]  hydrochlorothiazide (MICROZIDE) 12.5 MG capsule Take 12.5 mg by mouth daily.     [provider]  hydrocortisone 2.5 % lotion APPLY TO AFFECTEDAREA RASH ON BUTTOCKS 3 DAYS PER WEEK. TUESDAY, THURSDAY, SATURDAY, AND AS NEEDED FOR FLARES 07/16/20   Ralene Bathe, MD  Ipratropium-Albuterol (COMBIVENT) 20-100 MCG/ACT AERS respimat Inhale into the lungs. 05/27/20 05/27/21  [provider]  ketoconazole (NIZORAL) 2 % cream Apply to a rash on buttock 3 days a week. On Monday, Wednesday and Friday 04/19/20   [provider]  loratadine (CLARITIN) 10 MG tablet Take 10 mg by mouth daily.    [provider]  losartan (COZAAR) 100 MG tablet Take 1 tablet by mouth daily. 05/13/20   [provider]  losartan (COZAAR) 50 MG tablet Take 1 tablet (50 mg total) by mouth daily. 01/09/16   Loletha Grayer, MD  Magnesium  500 MG CAPS Take 1 capsule (500 mg total) by mouth at bedtime. 11/15/19 02/13/20  Milinda Pointer, MD  meloxicam (MOBIC) 15 MG tablet Take 1 tablet (15 mg total) by mouth daily. 11/15/19 02/13/20  Milinda Pointer, MD  metFORMIN (GLUCOPHAGE) 1000 MG tablet Take 1,000 mg by mouth 2 (two) times daily with a meal.    [provider]  oxybutynin (DITROPAN) 5 MG tablet Take 5 mg by mouth every 12 (twelve) hours.    [provider]  OZEMPIC, 0.25 OR 0.5 MG/DOSE, 2 MG/1.5ML SOPN Inject 0.5 mg into the skin once a week. 04/15/20   [provider]  pantoprazole (PROTONIX) 40 MG tablet Take 40 mg by mouth 2 (two) times daily.     [provider]  phentermine 37.5  MG capsule Take 37.5 mg by mouth every morning.    [provider]  Potassium Gluconate 595 MG CAPS Take by mouth.    [provider]  simethicone (MYLICON) 664 MG chewable tablet Chew 125 mg by mouth every 6 (six) hours as needed for flatulence.    [provider]  spironolactone (ALDACTONE) 50 MG tablet Take 50 mg by mouth 2 (two) times daily. 03/13/20   [provider]  tiZANidine (ZANAFLEX) 4 MG tablet Take 1 tablet (4 mg total) by mouth every 8 (eight) hours as needed for muscle spasms. 11/15/19 02/13/20  Milinda Pointer, MD  triamcinolone ointment (KENALOG) 0.1 % APPLY AT BEDTIME EVERYDAY TO FEET AND LOWER LEGS. AVOID FACE,GROIN, AND AXILLA 07/16/20   Ralene Bathe, MD  TRUEPLUS 5-BEVEL PEN NEEDLES 31G X 6 MM [place]  12/28/18   [provider]  umeclidinium-vilanterol (ANORO ELLIPTA) 62.5-25 MCG/INH AEPB Inhale into the lungs. 10/06/19   [provider]  Urea 35 % CREA Apply 1 application topically as directed. Qod to lower legs 05/30/20   Ralene Bathe, MD  Urea 40 % LOTN Apply 1 application topically every other day. 04/24/20   Ralene Bathe, MD  VICTOZA 18 MG/3ML SOPN daily.  12/28/18   [provider]  vitamin E 180 MG (400 UNITS) capsule Take 400 Units by mouth daily.    [provider]    Allergies Shellfish allergy, Codeine, and Lisinopril  Family History  Problem Relation Age of Onset   Diabetes Mother    Diabetes Father    Stomach cancer Father    Heart attack Father    Alcohol abuse Father    Heart attack Sister    Heart attack Brother    Heart attack Paternal Aunt    Heart attack Paternal Uncle     Social History Social History   Tobacco Use   Smoking status: Former    Packs/day: 0.75    Years: 30.00    Pack years: 22.50    Types: Cigarettes    Quit date: 08/13/2019    Years since quitting: 0.9   Smokeless tobacco: Never   Tobacco comments:    trying decrease  Vaping Use   Vaping  Use: Never used  Substance Use Topics   Alcohol use: No   Drug use: No    Review of Systems  Review of Systems  Constitutional:  Negative for chills and fever.  HENT:  Negative for sore throat.   Eyes:  Negative for pain.  Respiratory:  Negative for cough and stridor.   Cardiovascular:  Negative for chest pain.  Gastrointestinal:  Negative for vomiting.  Genitourinary:  Negative for dysuria.  Musculoskeletal:  Positive for  myalgias (b/l lower legs).  Skin:  Negative for rash.  Neurological:  Positive for weakness. Negative for seizures, loss of consciousness and headaches.  Psychiatric/Behavioral:  Negative for suicidal ideas.   All other systems reviewed and are negative.    ____________________________________________   PHYSICAL EXAM:  VITAL SIGNS: ED Triage Vitals  Enc Vitals Group     BP      Pulse      Resp      Temp      Temp src      SpO2      Weight      Height      Head Circumference      Peak Flow      Pain Score      Pain Loc      Pain Edu?      Excl. in [place]?    Vitals:   07/22/20 1400 07/22/20 1430  BP: 127/67 99/72  Pulse: 82 75  Resp: 14 15  Temp:    SpO2: 100% 98%   Physical Exam Vitals and nursing note reviewed.  Constitutional:      Appearance: She is well-developed. She is obese.  HENT:     Head: Normocephalic and atraumatic.     Right Ear: External ear normal.     Left Ear: External ear normal.     Nose: Nose normal.  Eyes:     Conjunctiva/sclera: Conjunctivae normal.  Cardiovascular:     Rate and Rhythm: Normal rate and regular rhythm.     Heart sounds: No murmur heard. Pulmonary:     Effort: Pulmonary effort is normal. No respiratory distress.     Breath sounds: Normal breath sounds.  Abdominal:     Palpations: Abdomen is soft.     Tenderness: There is no abdominal tenderness.  Musculoskeletal:     Cervical back: Neck supple.     Right lower leg: Edema present.     Left lower leg: Edema present.  Skin:    General: Skin  is warm and dry.  Neurological:     Mental Status: She is alert and oriented to person, place, and time.  Psychiatric:        Mood and Affect: Mood normal.    Patient has some erythema and induration edema and tenderness about the bilateral lower extremities from just distal to the knees to the ankles.  There is some bronzing of the skin over the feet but no significant erythema.  There is little bit of skin breakdown over the right lateral aspect of the lower leg just proximal to the ankle on the lateral side.  There is also a little bit of blistering of the left anterior lower leg but no crepitus or other areas of skin breakdown. ____________________________________________   LABS (all labs ordered are listed, but only abnormal results are displayed)  Labs Reviewed  CBC WITH DIFFERENTIAL/PLATELET - Abnormal; Notable for the following components:      Result Value   RBC 4.01 (*)    Hemoglobin 11.9 (*)    HCT 35.9 (*)    All other components within normal limits  COMPREHENSIVE METABOLIC PANEL - Abnormal; Notable for the following components:   BUN 22 (*)    Total Protein 6.4 (*)    All other components within normal limits  URINALYSIS, COMPLETE (UACMP) WITH MICROSCOPIC - Abnormal; Notable for the following components:   Color, Urine YELLOW (*)    APPearance CLEAR (*)    All other components within  normal limits  RESP PANEL BY RT-PCR (FLU A&B, COVID) ARPGX2  MAGNESIUM  CK  LACTIC ACID, PLASMA  PROCALCITONIN  TSH  LACTIC ACID, PLASMA  TROPONIN I (HIGH SENSITIVITY)  TROPONIN I (HIGH SENSITIVITY)   ____________________________________________  EKG  Sinus rhythm with ventricular to 74, normal axis, unremarkable intervals without evidence of acute ischemia or significant arrhythmia. ____________________________________________  RADIOLOGY  ED MD interpretation: Chest x-ray is very mild point vascular congestion without focal consolidation, effusion, pneumothorax or other clear  acute thoracic process.  Bilateral lower extremity ultrasound shows no evidence of DVT or other clear acute abnormality.  Official radiology report(s): US Venous Img Lower Bilateral  Result Date: 07/22/2020 CLINICAL DATA:  Redness and swelling.  Status post fall 2 days ago. EXAM: BILATERAL LOWER EXTREMITY VENOUS DOPPLER ULTRASOUND TECHNIQUE: Gray-scale sonography with compression, as well as color and duplex ultrasound, were performed to evaluate the deep venous system(s) from the level of the common femoral vein through the popliteal and proximal calf veins. COMPARISON:  None. FINDINGS: VENOUS Normal compressibility of the common femoral, superficial femoral, and popliteal veins, as well as the visualized calf veins. Visualized portions of profunda femoral vein and great saphenous vein unremarkable. No filling defects to suggest DVT on grayscale or color Doppler imaging. Doppler waveforms show normal direction of venous flow, normal respiratory plasticity and response to augmentation. Limited views of the contralateral common femoral vein are unremarkable. OTHER None. Limitations: none IMPRESSION: Negative for DVT in bilateral lower extremities. Electronically Signed   By: Kerby Moors M.D.   On: 07/22/2020 12:50   DG Chest Portable 1 View  Result Date: 07/22/2020 CLINICAL DATA:  Generalized weakness EXAM: PORTABLE CHEST 1 VIEW COMPARISON:  None. FINDINGS: Normal cardiac silhouette. Central venous pulmonary congestion. No focal infiltrate. No pneumothorax or overt pulmonary edema. No acute osseous abnormality. IMPRESSION: Central venous congestion. Electronically Signed   By: Suzy Bouchard M.D.   On: 07/22/2020 12:52    ____________________________________________   PROCEDURES  Procedure(s) performed (including Critical Care):  .1-3 Lead EKG Interpretation  Date/Time: 07/22/2020 3:21 PM Performed by: Lucrezia Starch, MD Authorized by: Lucrezia Starch, MD     Interpretation: normal      ECG rate assessment: normal     Rhythm: sinus rhythm     Ectopy: none     Conduction: normal     ____________________________________________   INITIAL IMPRESSION / ASSESSMENT AND PLAN / ED COURSE      Patient presents with above-stated history exam for assessment approximately 2 days of lower extremity swelling redness and pain after hitting his right leg against a wheelbarrow.  He denies any other clear associated symptoms although feels overall weak and thinks his blood pressure might be low.  He does state has not had much of an appetite last couple days.  On arrival he is afebrile hemodynamically stable with a BP of 97/60.  On exam of his legs he has bilateral erythema and edema as well as warmth and tenderness with a little skin breakdown on the right and some blistering on the left.  Differential for patient's leg redness and swelling and pain includes cellulitis, venous stasis, DVT, and possible myositis.  No findings on exam to suggest penetrating injury to the tib-fib on either side and there seems to be no significant volume of the ankle or knee joints bilaterally.  Chest x-ray is very mild point vascular congestion without focal consolidation, effusion, pneumothorax or other clear acute thoracic process.  Bilateral lower extremity ultrasound shows  no evidence of DVT or other clear acute abnormality.  In addition initial differential for low blood pressure includes heart failure, ACS, arrhythmia, anemia, dehydration.  Lower suspicion for PE at this time given absence of any DVT noted on ultrasound or any hypoxia tachypnea or patient complaining of any acute shortness of breath chest pain.  COVID influenza PCR is negative.  CBC shows no leukocytosis and hemoglobin of 11.9 compared to 12.92 years ago.  CMP shows no significant electrode or metabolic derangements.  Seems unremarkable and CK is within normal is not consistent with myositis.  UA has no evidence of infection.  Troponin is  nonelevated and given reassuring EKG without acute chest pain Evalose patient for ACS or myocarditis.  Lactic is 1.8 and while patient's blood pressure was initially somewhat soft this improved with IV fluids and given absence of fever or leukocytosis or other signs of systemic infection Evalose patient for sepsis at this time.  In addition procalcitonin is undetectable.  TSH is unremarkable.  Overall suspect patient's low blood pressure may be related to some dehydration as he did endorse history of decreased appetite and p.o. intake of last couple days.  His blood pressure did improve with IV hydration and given otherwise reassuring cardiopulmonary work-up I have low suspicion for immediate life-threatening process.  Will give short course of Doxy to cover possible cellulitis in his lower extremities.  Advised to eat and drink plenty and follow-up with his PCP.  Discharged stable condition.  Strict return precautions advised and discussed.      ____________________________________________   FINAL CLINICAL IMPRESSION(S) / ED DIAGNOSES  Final diagnoses:  Dehydration  Cellulitis of lower extremity, unspecified laterality    Medications  Tdap (BOOSTRIX) injection 0.5 mL (0.5 mLs Intramuscular Given 07/22/20 1005)  lactated ringers bolus 1,000 mL (0 mLs Intravenous Stopped 07/22/20 1223)  lactated ringers bolus 1,000 mL (1,000 mLs Intravenous New Bag/Given 07/22/20 1232)     ED Discharge Orders          Ordered    doxycycline (VIBRAMYCIN) 100 MG capsule  2 times daily        07/22/20 1359             Note:  This document was prepared using Dragon voice recognition software and may include unintentional dictation errors.    Lucrezia Starch, MD 07/22/20 [phone]

## 2020-07-22 NOTE — ED Triage Notes (Signed)
Pt to ER via EMS from home with c/o generalized weakness and feeling like BP is low.  VSS per EMS.  Pt has hx of cellulitis and note weeping to lower legs.  Pt c/o feelings of general unwellness.

## 2020-08-19 ENCOUNTER — Other Ambulatory Visit: Payer: Self-pay | Admitting: Psychiatry

## 2020-08-19 ENCOUNTER — Telehealth: Payer: Self-pay

## 2020-08-19 MED ORDER — ARIPIPRAZOLE 2 MG PO TABS: 2.0000 mg | ORAL_TABLET | Freq: Every day | ORAL | 0 refills | Status: DC

## 2020-08-19 NOTE — Telephone Encounter (Signed)
pt called left message he needs refills on his abilify

## 2020-08-19 NOTE — Telephone Encounter (Signed)
Ordered
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
## 2020-08-26 ENCOUNTER — Encounter: Payer: Self-pay | Admitting: Psychiatry

## 2020-08-26 ENCOUNTER — Ambulatory Visit (INDEPENDENT_AMBULATORY_CARE_PROVIDER_SITE_OTHER): Payer: Medicare Other | Admitting: Psychiatry

## 2020-08-26 ENCOUNTER — Other Ambulatory Visit: Payer: Self-pay

## 2020-08-26 VITALS — BP 120/77 | HR 88 | Temp 97.8°F | Wt 319.2 lb

## 2020-08-26 DIAGNOSIS — F331 Major depressive disorder, recurrent, moderate: Secondary | ICD-10-CM | POA: Diagnosis not present

## 2020-08-26 DIAGNOSIS — G4700 Insomnia, unspecified: Secondary | ICD-10-CM

## 2020-08-26 MED ORDER — ARIPIPRAZOLE 2 MG PO TABS: 2.0000 mg | ORAL_TABLET | Freq: Every day | ORAL | 1 refills | Status: DC

## 2020-08-26 MED ORDER — BUPROPION HCL ER (XL) 300 MG PO TB24: 300.0000 mg | ORAL_TABLET | Freq: Every day | ORAL | 2 refills | Status: DC

## 2020-08-26 NOTE — Patient Instructions (Signed)
1. Continue lexapro 20 mg daily  2. Continue Abilify 2 mg daily- 3. Increase bupropion 300 mg daily  4. Return in 2 months, 10/17 at 9:30, in person 5. Referral for therapy 6. Referral for evaluation of sleep

## 2020-08-29 ENCOUNTER — Telehealth (INDEPENDENT_AMBULATORY_CARE_PROVIDER_SITE_OTHER): Payer: Self-pay | Admitting: Nurse Practitioner

## 2020-08-29 NOTE — Telephone Encounter (Signed)
Lvm for patient to callback to be scheduled   218-pt conv with FB possible leg infection

## 2020-09-09 ENCOUNTER — Other Ambulatory Visit: Payer: Self-pay | Admitting: Psychiatry

## 2020-09-09 ENCOUNTER — Telehealth: Payer: No Typology Code available for payment source | Admitting: Psychiatry

## 2020-09-09 MED ORDER — ARIPIPRAZOLE 2 MG PO TABS: 2.0000 mg | ORAL_TABLET | Freq: Every day | ORAL | 1 refills | Status: DC

## 2020-09-09 NOTE — Telephone Encounter (Signed)
Ordered Abilify to select RX based on request. Could you contact [facility] and cancel Abilify orders? Thanks.

## 2020-09-25 ENCOUNTER — Ambulatory Visit: Payer: No Typology Code available for payment source | Admitting: Licensed Clinical Social Worker

## 2020-09-25 NOTE — Telephone Encounter (Signed)
Call pt at 9;55 am LVM

## 2020-09-29 ENCOUNTER — Emergency Department: Payer: Medicare Other

## 2020-09-29 ENCOUNTER — Inpatient Hospital Stay
Admission: EM | Admit: 2020-09-29 | Discharge: 2020-10-03 | DRG: 917 | Disposition: A | Discharge status: Other Acute Inpatient Hospital | Payer: Medicare Other | Attending: Internal Medicine | Admitting: Internal Medicine

## 2020-09-29 DIAGNOSIS — T50901A Poisoning by unspecified drugs, medicaments and biological substances, accidental (unintentional), initial encounter: Secondary | ICD-10-CM | POA: Diagnosis present

## 2020-09-29 DIAGNOSIS — T50902A Poisoning by unspecified drugs, medicaments and biological substances, intentional self-harm, initial encounter: Secondary | ICD-10-CM

## 2020-09-29 DIAGNOSIS — F4000 Agoraphobia, unspecified: Secondary | ICD-10-CM | POA: Diagnosis present

## 2020-09-29 DIAGNOSIS — J690 Pneumonitis due to inhalation of food and vomit: Secondary | ICD-10-CM | POA: Diagnosis present

## 2020-09-29 DIAGNOSIS — I10 Essential (primary) hypertension: Secondary | ICD-10-CM | POA: Diagnosis present

## 2020-09-29 DIAGNOSIS — T50902D Poisoning by unspecified drugs, medicaments and biological substances, intentional self-harm, subsequent encounter: Secondary | ICD-10-CM | POA: Diagnosis not present

## 2020-09-29 DIAGNOSIS — Z9911 Dependence on respirator [ventilator] status: Secondary | ICD-10-CM

## 2020-09-29 DIAGNOSIS — J9601 Acute respiratory failure with hypoxia: Secondary | ICD-10-CM | POA: Diagnosis present

## 2020-09-29 DIAGNOSIS — Z833 Family history of diabetes mellitus: Secondary | ICD-10-CM

## 2020-09-29 DIAGNOSIS — T465X2A Poisoning by other antihypertensive drugs, intentional self-harm, initial encounter: Secondary | ICD-10-CM | POA: Diagnosis present

## 2020-09-29 DIAGNOSIS — G928 Other toxic encephalopathy: Secondary | ICD-10-CM | POA: Diagnosis present

## 2020-09-29 DIAGNOSIS — Z79899 Other long term (current) drug therapy: Secondary | ICD-10-CM

## 2020-09-29 DIAGNOSIS — M5441 Lumbago with sciatica, right side: Secondary | ICD-10-CM | POA: Diagnosis present

## 2020-09-29 DIAGNOSIS — Y92009 Unspecified place in unspecified non-institutional (private) residence as the place of occurrence of the external cause: Secondary | ICD-10-CM

## 2020-09-29 DIAGNOSIS — G8929 Other chronic pain: Secondary | ICD-10-CM | POA: Diagnosis present

## 2020-09-29 DIAGNOSIS — G9340 Encephalopathy, unspecified: Secondary | ICD-10-CM | POA: Diagnosis not present

## 2020-09-29 DIAGNOSIS — I872 Venous insufficiency (chronic) (peripheral): Secondary | ICD-10-CM | POA: Diagnosis present

## 2020-09-29 DIAGNOSIS — I2510 Atherosclerotic heart disease of native coronary artery without angina pectoris: Secondary | ICD-10-CM | POA: Diagnosis present

## 2020-09-29 DIAGNOSIS — F32A Depression, unspecified: Secondary | ICD-10-CM | POA: Diagnosis present

## 2020-09-29 DIAGNOSIS — Z791 Long term (current) use of non-steroidal anti-inflammatories (NSAID): Secondary | ICD-10-CM

## 2020-09-29 DIAGNOSIS — Z811 Family history of alcohol abuse and dependence: Secondary | ICD-10-CM | POA: Diagnosis not present

## 2020-09-29 DIAGNOSIS — J449 Chronic obstructive pulmonary disease, unspecified: Secondary | ICD-10-CM | POA: Diagnosis present

## 2020-09-29 DIAGNOSIS — G4733 Obstructive sleep apnea (adult) (pediatric): Secondary | ICD-10-CM | POA: Diagnosis present

## 2020-09-29 DIAGNOSIS — T428X2A Poisoning by antiparkinsonism drugs and other central muscle-tone depressants, intentional self-harm, initial encounter: Secondary | ICD-10-CM | POA: Diagnosis present

## 2020-09-29 DIAGNOSIS — M545 Low back pain, unspecified: Secondary | ICD-10-CM | POA: Diagnosis present

## 2020-09-29 DIAGNOSIS — N179 Acute kidney failure, unspecified: Secondary | ICD-10-CM | POA: Diagnosis present

## 2020-09-29 DIAGNOSIS — Z8249 Family history of ischemic heart disease and other diseases of the circulatory system: Secondary | ICD-10-CM | POA: Diagnosis not present

## 2020-09-29 DIAGNOSIS — R45851 Suicidal ideations: Secondary | ICD-10-CM | POA: Diagnosis not present

## 2020-09-29 DIAGNOSIS — Z6841 Body Mass Index (BMI) 40.0 and over, adult: Secondary | ICD-10-CM

## 2020-09-29 DIAGNOSIS — Z978 Presence of other specified devices: Secondary | ICD-10-CM

## 2020-09-29 DIAGNOSIS — Z885 Allergy status to narcotic agent status: Secondary | ICD-10-CM

## 2020-09-29 DIAGNOSIS — E872 Acidosis, unspecified: Secondary | ICD-10-CM

## 2020-09-29 DIAGNOSIS — E785 Hyperlipidemia, unspecified: Secondary | ICD-10-CM | POA: Diagnosis present

## 2020-09-29 DIAGNOSIS — Z7982 Long term (current) use of aspirin: Secondary | ICD-10-CM

## 2020-09-29 DIAGNOSIS — R4182 Altered mental status, unspecified: Secondary | ICD-10-CM

## 2020-09-29 DIAGNOSIS — E119 Type 2 diabetes mellitus without complications: Secondary | ICD-10-CM | POA: Diagnosis present

## 2020-09-29 DIAGNOSIS — Z23 Encounter for immunization: Secondary | ICD-10-CM

## 2020-09-29 DIAGNOSIS — F332 Major depressive disorder, recurrent severe without psychotic features: Secondary | ICD-10-CM | POA: Diagnosis not present

## 2020-09-29 DIAGNOSIS — Z20822 Contact with and (suspected) exposure to covid-19: Secondary | ICD-10-CM | POA: Diagnosis present

## 2020-09-29 DIAGNOSIS — J9621 Acute and chronic respiratory failure with hypoxia: Secondary | ICD-10-CM

## 2020-09-29 DIAGNOSIS — M5442 Lumbago with sciatica, left side: Secondary | ICD-10-CM | POA: Diagnosis present

## 2020-09-29 DIAGNOSIS — Z87891 Personal history of nicotine dependence: Secondary | ICD-10-CM

## 2020-09-29 DIAGNOSIS — E669 Obesity, unspecified: Secondary | ICD-10-CM | POA: Diagnosis present

## 2020-09-29 DIAGNOSIS — G894 Chronic pain syndrome: Secondary | ICD-10-CM | POA: Diagnosis present

## 2020-09-29 DIAGNOSIS — Z7984 Long term (current) use of oral hypoglycemic drugs: Secondary | ICD-10-CM

## 2020-09-29 DIAGNOSIS — I959 Hypotension, unspecified: Secondary | ICD-10-CM | POA: Diagnosis present

## 2020-09-29 DIAGNOSIS — Z91013 Allergy to seafood: Secondary | ICD-10-CM

## 2020-09-29 DIAGNOSIS — G929 Unspecified toxic encephalopathy: Secondary | ICD-10-CM | POA: Diagnosis present

## 2020-09-29 LAB — CBC WITH DIFFERENTIAL/PLATELET
Abs Immature Granulocytes: 0.09 10*3/uL — ABNORMAL HIGH (ref 0.00–0.07)
Basophils Absolute: 0.1 10*3/uL (ref 0.0–0.1)
Basophils Relative: 1 %
Eosinophils Absolute: 0.3 10*3/uL (ref 0.0–0.5)
Eosinophils Relative: 3 %
HCT: 37.1 % — ABNORMAL LOW (ref 39.0–52.0)
Hemoglobin: 12.1 g/dL — ABNORMAL LOW (ref 13.0–17.0)
Immature Granulocytes: 1 %
Lymphocytes Relative: 10 %
Lymphs Abs: 1 10*3/uL (ref 0.7–4.0)
MCH: 28.3 pg (ref 26.0–34.0)
MCHC: 32.6 g/dL (ref 30.0–36.0)
MCV: 86.9 fL (ref 80.0–100.0)
Monocytes Absolute: 0.8 10*3/uL (ref 0.1–1.0)
Monocytes Relative: 8 %
Neutro Abs: 7.5 10*3/uL (ref 1.7–7.7)
Neutrophils Relative %: 77 %
Platelets: 226 10*3/uL (ref 150–400)
RBC: 4.27 MIL/uL (ref 4.22–5.81)
RDW: 13.2 % (ref 11.5–15.5)
WBC: 9.7 10*3/uL (ref 4.0–10.5)
nRBC: 0 % (ref 0.0–0.2)

## 2020-09-29 LAB — BLOOD GAS, ARTERIAL
Acid-base deficit: 4.7 mmol/L — ABNORMAL HIGH (ref 0.0–2.0)
Bicarbonate: 20.4 mmol/L (ref 20.0–28.0)
FIO2: 70
MECHVT: 500 mL
Mechanical Rate: 20
O2 Saturation: 99.7 %
PEEP: 5 cmH2O
Patient temperature: 37
pCO2 arterial: 37 mmHg (ref 32.0–48.0)
pH, Arterial: 7.35 (ref 7.350–7.450)
pO2, Arterial: 200 mmHg — ABNORMAL HIGH (ref 83.0–108.0)

## 2020-09-29 LAB — COMPREHENSIVE METABOLIC PANEL
ALT: 14 U/L (ref 0–44)
AST: 22 U/L (ref 15–41)
Albumin: 3.1 g/dL — ABNORMAL LOW (ref 3.5–5.0)
Alkaline Phosphatase: 50 U/L (ref 38–126)
Anion gap: 6 (ref 5–15)
BUN: 18 mg/dL (ref 6–20)
CO2: 23 mmol/L (ref 22–32)
Calcium: 8.9 mg/dL (ref 8.9–10.3)
Chloride: 106 mmol/L (ref 98–111)
Creatinine, Ser: 1.31 mg/dL — ABNORMAL HIGH (ref 0.61–1.24)
GFR, Estimated: >60 mL/min (ref >60)
Glucose, Bld: 175 mg/dL — ABNORMAL HIGH (ref 70–99)
Potassium: 3.8 mmol/L (ref 3.5–5.1)
Sodium: 135 mmol/L (ref 135–145)
Total Bilirubin: 0.7 mg/dL (ref 0.3–1.2)
Total Protein: 5.8 g/dL — ABNORMAL LOW (ref 6.5–8.1)

## 2020-09-29 LAB — ACETAMINOPHEN LEVEL: Acetaminophen (Tylenol), Serum: <10 ug/mL — ABNORMAL LOW (ref 10–30)

## 2020-09-29 LAB — URINE DRUG SCREEN, QUALITATIVE (ARMC ONLY)
Amphetamines, Ur Screen: NOT DETECTED
Barbiturates, Ur Screen: NOT DETECTED
Benzodiazepine, Ur Scrn: NOT DETECTED
Cannabinoid 50 Ng, Ur ~~LOC~~: NOT DETECTED
Cocaine Metabolite,Ur ~~LOC~~: NOT DETECTED
MDMA (Ecstasy)Ur Screen: NOT DETECTED
Methadone Scn, Ur: NOT DETECTED
Opiate, Ur Screen: NOT DETECTED
Phencyclidine (PCP) Ur S: NOT DETECTED
Tricyclic, Ur Screen: NOT DETECTED

## 2020-09-29 LAB — RESP PANEL BY RT-PCR (FLU A&B, COVID) ARPGX2
Influenza A by PCR: NEGATIVE
Influenza B by PCR: NEGATIVE
SARS Coronavirus 2 by RT PCR: NEGATIVE

## 2020-09-29 LAB — PROTIME-INR
INR: 1.1 (ref 0.8–1.2)
Prothrombin Time: 14.3 seconds (ref 11.4–15.2)

## 2020-09-29 LAB — URINALYSIS, COMPLETE (UACMP) WITH MICROSCOPIC
Bilirubin Urine: NEGATIVE
Glucose, UA: NEGATIVE mg/dL
Ketones, ur: NEGATIVE mg/dL
Leukocytes,Ua: NEGATIVE
Nitrite: NEGATIVE
Protein, ur: 100 mg/dL — AB
Specific Gravity, Urine: >1.03 — ABNORMAL HIGH (ref 1.005–1.030)
pH: 5.5 (ref 5.0–8.0)

## 2020-09-29 LAB — SALICYLATE LEVEL: Salicylate Lvl: <7 mg/dL — ABNORMAL LOW (ref 7.0–30.0)

## 2020-09-29 LAB — LACTIC ACID, PLASMA
Lactic Acid, Venous: 2.5 mmol/L (ref 0.5–1.9)
Lactic Acid, Venous: 2.1 mmol/L (ref 0.5–1.9)

## 2020-09-29 LAB — APTT: aPTT: 25 seconds (ref 24–36)

## 2020-09-29 LAB — MAGNESIUM: Magnesium: 1.9 mg/dL (ref 1.7–2.4)

## 2020-09-29 LAB — PHOSPHORUS: Phosphorus: 2.8 mg/dL (ref 2.5–4.6)

## 2020-09-29 MED ORDER — MIDAZOLAM HCL 2 MG/2ML IJ SOLN
2.0000 mg | Freq: Once | INTRAMUSCULAR | Status: AC
  Administered 2020-09-29: 2 mg via INTRAVENOUS
  Filled 2020-09-29: qty 2

## 2020-09-29 MED ORDER — SODIUM CHLORIDE 0.9 % IV BOLUS
1000.0000 mL | Freq: Once | INTRAVENOUS | Status: AC
  Administered 2020-09-30: 1000 mL via INTRAVENOUS

## 2020-09-29 MED ORDER — DOCUSATE SODIUM 100 MG PO CAPS: 100.0000 mg | ORAL_CAPSULE | Freq: Two times a day (BID) | ORAL | Status: DC | PRN

## 2020-09-29 MED ORDER — FENTANYL BOLUS VIA INFUSION
50.0000 ug | INTRAVENOUS | Status: DC | PRN
  Filled 2020-09-29: qty 100

## 2020-09-29 MED ORDER — DEXMEDETOMIDINE HCL IN NACL 400 MCG/100ML IV SOLN
0.4000 ug/kg/h | INTRAVENOUS | Status: DC
  Administered 2020-09-29: 0.4 ug/kg/h via INTRAVENOUS
  Administered 2020-09-29: 1.2 ug/kg/h via INTRAVENOUS
  Filled 2020-09-29 (×2): qty 100

## 2020-09-29 MED ORDER — MIDAZOLAM HCL 2 MG/2ML IJ SOLN: 2.0000 mg | INTRAMUSCULAR | Status: DC | PRN

## 2020-09-29 MED ORDER — KETAMINE HCL 50 MG/5ML IJ SOSY
PREFILLED_SYRINGE | INTRAMUSCULAR | Status: AC
  Administered 2020-09-29: 50 mg
  Filled 2020-09-29: qty 5

## 2020-09-29 MED ORDER — SODIUM CHLORIDE 0.9 % IV SOLN: INTRAVENOUS | Status: AC

## 2020-09-29 MED ORDER — FENTANYL CITRATE PF 50 MCG/ML IJ SOSY: 50.0000 ug | PREFILLED_SYRINGE | INTRAMUSCULAR | Status: DC | PRN

## 2020-09-29 MED ORDER — FENTANYL 2500MCG IN NS 250ML (10MCG/ML) PREMIX INFUSION
0.0000 ug/h | INTRAVENOUS | Status: DC
  Administered 2020-09-29: 25 ug/h via INTRAVENOUS
  Filled 2020-09-29: qty 250

## 2020-09-29 MED ORDER — MIDAZOLAM-SODIUM CHLORIDE 100-0.9 MG/100ML-% IV SOLN
2.0000 mg/h | INTRAVENOUS | Status: DC
  Filled 2020-09-29: qty 100

## 2020-09-29 MED ORDER — INSULIN ASPART 100 UNIT/ML IJ SOLN
0.0000 [IU] | INTRAMUSCULAR | Status: DC
  Administered 2020-09-30 (×2): 2 [IU] via SUBCUTANEOUS
  Filled 2020-09-29 (×3): qty 1

## 2020-09-29 MED ORDER — PROPOFOL 1000 MG/100ML IV EMUL
5.0000 ug/kg/min | INTRAVENOUS | Status: DC
  Administered 2020-09-29: 5 ug/kg/min via INTRAVENOUS
  Administered 2020-09-30: 60 ug/kg/min via INTRAVENOUS
  Administered 2020-09-30: 70 ug/kg/min via INTRAVENOUS
  Administered 2020-09-30 (×2): 40 ug/kg/min via INTRAVENOUS
  Filled 2020-09-29 (×6): qty 100

## 2020-09-29 MED ORDER — SODIUM CHLORIDE 0.9 % IV SOLN: 250.0000 mL | INTRAVENOUS | Status: DC

## 2020-09-29 MED ORDER — MIDAZOLAM HCL 2 MG/2ML IJ SOLN
2.0000 mg | INTRAMUSCULAR | Status: DC | PRN
  Administered 2020-09-29: 2 mg via INTRAVENOUS
  Filled 2020-09-29: qty 2

## 2020-09-29 MED ORDER — ROCURONIUM BROMIDE 10 MG/ML (PF) SYRINGE
PREFILLED_SYRINGE | INTRAVENOUS | Status: AC
  Administered 2020-09-29: 100 mg
  Filled 2020-09-29: qty 10

## 2020-09-29 MED ORDER — NOREPINEPHRINE 4 MG/250ML-% IV SOLN
2.0000 ug/min | INTRAVENOUS | Status: DC
  Administered 2020-09-30: 6 ug/min via INTRAVENOUS
  Administered 2020-09-30: 10 ug/min via INTRAVENOUS
  Filled 2020-09-29 (×2): qty 250

## 2020-09-29 MED ORDER — PANTOPRAZOLE SODIUM 40 MG IV SOLR
40.0000 mg | Freq: Every day | INTRAVENOUS | Status: DC
  Administered 2020-09-29 – 2020-10-02 (×4): 40 mg via INTRAVENOUS
  Filled 2020-09-29 (×4): qty 40

## 2020-09-29 MED ORDER — SODIUM CHLORIDE 0.9 % IV SOLN
3.0000 g | Freq: Four times a day (QID) | INTRAVENOUS | Status: DC
  Filled 2020-09-29 (×2): qty 8

## 2020-09-29 MED ORDER — ENOXAPARIN SODIUM 80 MG/0.8ML IJ SOSY
0.5000 mg/kg | PREFILLED_SYRINGE | INTRAMUSCULAR | Status: DC
  Administered 2020-09-30 – 2020-10-01 (×2): 72.5 mg via SUBCUTANEOUS
  Filled 2020-09-29: qty 0.8
  Filled 2020-09-29: qty 0.72

## 2020-09-29 MED ORDER — POLYETHYLENE GLYCOL 3350 17 G PO PACK: 17.0000 g | PACK | Freq: Every day | ORAL | Status: DC | PRN

## 2020-09-29 MED ORDER — POLYETHYLENE GLYCOL 3350 17 G PO PACK: 17.0000 g | PACK | Freq: Every day | ORAL | Status: DC

## 2020-09-29 MED ORDER — DOCUSATE SODIUM 50 MG/5ML PO LIQD
100.0000 mg | Freq: Two times a day (BID) | ORAL | Status: DC
  Filled 2020-09-29 (×2): qty 10

## 2020-09-29 MED ORDER — ROCURONIUM BROMIDE 10 MG/ML (PF) SYRINGE
PREFILLED_SYRINGE | INTRAVENOUS | Status: AC
  Administered 2020-09-29: 50 mg
  Filled 2020-09-29: qty 10

## 2020-09-29 NOTE — Progress Notes (Signed)
Pharmacy Antibiotic Note  [name] is a 55 y.o. adult admitted on 09/29/2020 with  aspiration PNA .  Pharmacy has been consulted for Unasyn dosing.  Plan: Unasyn 3 gm IV Q6H ordered to start on 9/18 @ 2330.   Weight: (!) 145.2 kg (320 lb)  Temp (24hrs), Avg:98.1 F (36.7 C), Min:97.6 F (36.4 C), Max:98.3 F (36.8 C)  Recent Labs  Lab 09/29/20 1914 09/29/20 2200  WBC 9.7  --   CREATININE 1.31*  --   LATICACIDVEN 2.5* 2.1*    Estimated Creatinine Clearance (by C-G formula based on SCr of 1.31 mg/dL (H)) Male: 70.7 mL/min (A) Male: 85.6 mL/min (A)    Allergies  Allergen Reactions   Shellfish Allergy Swelling   Codeine Rash   Lisinopril Cough    Antimicrobials this admission:   >>    >>   Dose adjustments this admission:   Microbiology results:  BCx:   UCx:    Sputum:    MRSA PCR:   Thank you for allowing pharmacy to be a part of this patient's care.  [name] D 09/29/2020 11:34 PM

## 2020-09-29 NOTE — ED Provider Notes (Signed)
[facility] Emergency Department Provider Note    Event Date/Time   First MD Initiated Contact with Patient 09/29/20 1857     (approximate)  I have reviewed the triage vital signs and the nursing notes.   HISTORY  Chief Complaint Drug Overdose  Level V caveat:  AMS, unresponsive  HPI [name] is a 55 y.o. adult with below listed past medical history presents to the ER unresponsive after reported intentional overdose of losartan as well as tizanidine of unknown certain amount and at unknown time.  EMS was called after friends saw patient closed comments reporting suicidal ideation on Facebook.  He was found unresponsive hypotensive not following commands.  Was given IV fluids in route as well as calcium.  Blood pressure did come up with IV fluids.  Patient unable provide any additional history.     Past Medical History:  Diagnosis Date   COPD (chronic obstructive pulmonary disease) ([place])    Coronary artery disease    Diabetes mellitus without complication ([place])    Hyperlipidemia    Hypertension    Peptic ulcer    Sleep apnea    Family History  Problem Relation Age of Onset   Diabetes Mother    Diabetes Father    Stomach cancer Father    Heart attack Father    Alcohol abuse Father    Heart attack Sister    Heart attack Brother    Heart attack Paternal Aunt    Heart attack Paternal Uncle    Past Surgical History:  Procedure Laterality Date   COLONOSCOPY WITH PROPOFOL N/A 11/09/2016   Procedure: COLONOSCOPY WITH PROPOFOL;  Surgeon: Lollie Sails, MD;  Location[facility] ENDOSCOPY;  Service: Endoscopy;  Laterality: N/A;   COLONOSCOPY WITH PROPOFOL N/A 07/06/2017   Procedure: COLONOSCOPY WITH PROPOFOL;  Surgeon: Lollie Sails, MD;  Location: [facility] ENDOSCOPY;  Service: Endoscopy;  Laterality: N/A;   ESOPHAGOGASTRODUODENOSCOPY (EGD) WITH PROPOFOL N/A 11/09/2016   Procedure: ESOPHAGOGASTRODUODENOSCOPY (EGD) WITH PROPOFOL;  Surgeon:  Lollie Sails, MD;  Location: [facility] ENDOSCOPY;  Service: Endoscopy;  Laterality: N/A;   ESOPHAGOGASTRODUODENOSCOPY (EGD) WITH PROPOFOL N/A 07/06/2017   Procedure: ESOPHAGOGASTRODUODENOSCOPY (EGD) WITH PROPOFOL;  Surgeon: Lollie Sails, MD;  Location: [facility] ENDOSCOPY;  Service: Endoscopy;  Laterality: N/A;   Patient Active Problem List   Diagnosis Date Noted   Venous stasis dermatitis of both lower extremities 08/17/2019   Pain due to onychomycosis of toenails of both feet 05/15/2019   Chronic low back pain (1ry area of Pain) (Bilateral) w/o sciatica 03/09/2019   Bilateral lower leg cellulitis 06/27/2018   Osteoarthritis of facet joint of lumbar spine 02/14/2018   Osteoarthritis involving multiple joints 02/14/2018   Chronic musculoskeletal pain 02/14/2018   Neurogenic pain 02/14/2018   Abnormal MRI, lumbar spine 02/14/2018   History of allergy to shellfish 01/27/2018   Morbid obesity with BMI of 45.0-49.9, adult ([place]) 12/13/2017   Swelling of limb 11/26/2017   Lower limb ulcer, calf, left, limited to breakdown of skin ([place]) 11/26/2017   DM2 (diabetes mellitus, type 2) ([place]) 11/03/2017   Neurogenic bladder 11/03/2017   Spondylosis without myelopathy or radiculopathy, lumbar region 11/03/2017   Lumbar facet syndrome (Bilateral) (R>L) 11/03/2017   DDD (degenerative disc disease), lumbar 11/02/2017   Lumbar facet hypertrophy (Bilateral) 11/02/2017   Lumbar central spinal stenosis (L4-5) 11/02/2017   Lumbar lateral recess stenosis (L4-5) (Bilateral) (L>R) 11/02/2017   Vitamin D insufficiency 11/02/2017   Lumbar spondylosis 11/02/2017   Adjustment disorder with  mixed disturbance of emotions and conduct 10/18/2017   Grief 10/18/2017   Chronic low back pain (Bilateral) (R>L) w/ sciatica (Bilateral) 10/07/2017   Chronic lower extremity pain (Secondary Area of Pain) (Bilateral) (R>L) 10/07/2017   Chronic pain syndrome 10/07/2017   Pharmacologic therapy 10/07/2017   Disorder of  skeletal system 10/07/2017   Problems influencing health status 10/07/2017   Barrett's esophagus without dysplasia 03/10/2017   Colon cancer screening 10/14/2016   Essential hypertension, benign 02/11/2016   Hyperlipidemia 02/11/2016   Tobacco dependence 02/11/2016   Obesity 02/11/2016   Lower limb ulcer, ankle, left, with fat layer exposed ([place]) 02/11/2016   Lymphedema 02/11/2016   Cellulitis 01/06/2016      Prior to Admission medications   Medication Sig Start Date End Date Taking? Authorizing Provider  ARIPiprazole (ABILIFY) 2 MG tablet Take 1 tablet (2 mg total) by mouth daily. 09/19/20 11/18/20 Yes Hisada, Elie Goody, MD  atorvastatin (LIPITOR) 20 MG tablet Take 20 mg by mouth at bedtime.    Yes [provider]  escitalopram (LEXAPRO) 20 MG tablet Take 20 mg by mouth daily. 10/06/19 09/29/20 Yes [provider]  estradiol (ESTRACE) 2 MG tablet Take by mouth. 03/13/20  Yes [provider]  metFORMIN (GLUCOPHAGE) 1000 MG tablet Take 1,000 mg by mouth 2 (two) times daily with a meal.   Yes [provider]  oxybutynin (DITROPAN) 5 MG tablet Take 5 mg by mouth every 12 (twelve) hours.   Yes [provider]  pantoprazole (PROTONIX) 40 MG tablet Take 40 mg by mouth 2 (two) times daily.    Yes [provider]  spironolactone (ALDACTONE) 50 MG tablet Take 50 mg by mouth 2 (two) times daily. 03/13/20  Yes [provider]  umeclidinium-vilanterol (ANORO ELLIPTA) 62.5-25 MCG/INH AEPB Inhale into the lungs. 10/06/19  Yes [provider]  albuterol (VENTOLIN HFA) 108 (90 Base) MCG/ACT inhaler Inhale 2 puffs into the lungs every 4 (four) hours as needed for wheezing or shortness of breath.     [provider]  aspirin-acetaminophen-caffeine (EXCEDRIN MIGRAINE) [phone] MG tablet Take by mouth every 6 (six) hours as needed for headache.    [provider]  buPROPion (WELLBUTRIN XL) 300 MG 24 hr tablet Take 1 tablet (300 mg  total) by mouth daily. 08/26/20 11/24/20  Norman Clay, MD  Calcium-Magnesium-Vitamin D (CALCIUM 1200+D3 PO) Take by mouth.    [provider]  Cholecalciferol (VITAMIN D) 125 MCG (5000 UT) CAPS Take by mouth.    [provider]  clindamycin (CLEOCIN T) 1 % external solution APPLY TO AFFECTED AREA RASH ON BUTTOCKS1 TIME A WEEK ON SUNDAYS 07/16/20   Ralene Bathe, MD  doxycycline (VIBRA-TABS) 100 MG tablet Take 100 mg by mouth 2 (two) times daily. Patient not taking: Reported on 09/29/2020 07/22/20   [provider]  fluconazole (DIFLUCAN) 200 MG tablet Take 1 tablet (200 mg total) by mouth 3 (three) times a week. 05/31/20   Ralene Bathe, MD  gabapentin (NEURONTIN) 300 MG capsule Take 300 mg by mouth 3 (three) times daily.  09/20/18   [provider]  hydrochlorothiazide (MICROZIDE) 12.5 MG capsule Take 12.5 mg by mouth daily.     [provider]  hydrocortisone 2.5 % lotion APPLY TO AFFECTEDAREA RASH ON BUTTOCKS 3 DAYS PER WEEK. TUESDAY, THURSDAY, SATURDAY, AND AS NEEDED FOR FLARES 07/16/20   Ralene Bathe, MD  Ipratropium-Albuterol (COMBIVENT) 20-100 MCG/ACT AERS respimat Inhale into the lungs. 05/27/20 05/27/21  [provider]  ketoconazole (  NIZORAL) 2 % cream Apply to a rash on buttock 3 days a week. On Monday, Wednesday and Friday 04/19/20   [provider]  loratadine (CLARITIN) 10 MG tablet Take 10 mg by mouth daily.    [provider]  losartan (COZAAR) 100 MG tablet Take 1 tablet by mouth daily. 05/13/20   [provider]  losartan (COZAAR) 50 MG tablet Take 1 tablet (50 mg total) by mouth daily. 01/09/16   Loletha Grayer, MD  Magnesium 500 MG CAPS Take 1 capsule (500 mg total) by mouth at bedtime. 11/15/19 02/13/20  Milinda Pointer, MD  meloxicam (MOBIC) 15 MG tablet Take 1 tablet (15 mg total) by mouth daily. 11/15/19 02/13/20  Milinda Pointer, MD  OZEMPIC, 0.25 OR 0.5 MG/DOSE, 2 MG/1.5ML SOPN Inject 0.5 mg  into the skin once a week. 04/15/20   [provider]  phentermine 37.5 MG capsule Take 37.5 mg by mouth every morning.    [provider]  Potassium Gluconate 595 MG CAPS Take by mouth.    [provider]  simethicone (MYLICON) 0000000 MG chewable tablet Chew 125 mg by mouth every 6 (six) hours as needed for flatulence.    [provider]  tiZANidine (ZANAFLEX) 4 MG tablet Take 1 tablet (4 mg total) by mouth every 8 (eight) hours as needed for muscle spasms. 11/15/19 02/13/20  Milinda Pointer, MD  triamcinolone ointment (KENALOG) 0.1 % APPLY AT BEDTIME EVERYDAY TO FEET AND LOWER LEGS. AVOID FACE,GROIN, AND AXILLA 07/16/20   Ralene Bathe, MD  TRUEPLUS 5-BEVEL PEN NEEDLES 31G X 6 MM [place]  12/28/18   [provider]  Urea 35 % CREA Apply 1 application topically as directed. Qod to lower legs 05/30/20   Ralene Bathe, MD  Urea 40 % LOTN Apply 1 application topically every other day. 04/24/20   Ralene Bathe, MD  VICTOZA 18 MG/3ML SOPN daily.  12/28/18   [provider]  vitamin E 180 MG (400 UNITS) capsule Take 400 Units by mouth daily.    [provider]    Allergies Shellfish allergy, Codeine, and Lisinopril    Social History Social History   Tobacco Use   Smoking status: Former    Packs/day: 0.75    Years: 30.00    Pack years: 22.50    Types: Cigarettes    Quit date: 08/13/2019    Years since quitting: 1.1   Smokeless tobacco: Never   Tobacco comments:    trying decrease  Vaping Use   Vaping Use: Never used  Substance Use Topics   Alcohol use: No   Drug use: No    Review of Systems Patient denies headaches, rhinorrhea, blurry vision, numbness, shortness of breath, chest pain, edema, cough, abdominal pain, nausea, vomiting, diarrhea, dysuria, fevers, rashes or hallucinations unless otherwise stated above in HPI. ____________________________________________   PHYSICAL EXAM:  VITAL SIGNS: Vitals:   09/29/20  2115 09/29/20 2130  BP: (!) 120/91 126/89  Pulse: 96 95  Resp: 19 (!) 26  Temp: 98.1 F (36.7 C) 98.2 F (36.8 C)  SpO2: 99% 97%    Constitutional: spontaneous resp, unresponsive Eyes: Conjunctivae are normal. Pupils midpoint and reactive Head: Atraumatic. Nose: No congestion/rhinnorhea. Mouth/Throat: Mucous membranes are moist.   Neck: No stridor. Painless ROM.  Cardiovascular: Normal rate, regular rhythm. Grossly normal heart sounds.  Good peripheral circulation. Respiratory: Normal respiratory effort.  No retractions. Lungs CTAB. Gastrointestinal: Soft and nontender. No distention. No abdominal bruits. No CVA tenderness. Genitourinary:  Musculoskeletal:  No lower extremity tenderness nor edema.  No joint effusions. Neurologic:  GCS Skin:  Skin is warm, dry and intact. No rash noted. Psychiatric: Mood and affect are normal. Speech and behavior are normal.  ____________________________________________   LABS (all labs ordered are listed, but only abnormal results are displayed)  Results for orders placed or performed during the hospital encounter of 09/29/20 (from the past 24 hour(s))  Lactic acid, plasma     Status: Abnormal   Collection Time: 09/29/20  7:14 PM  Result Value Ref Range   Lactic Acid, Venous 2.5 (HH) 0.5 - 1.9 mmol/L  Comprehensive metabolic panel     Status: Abnormal   Collection Time: 09/29/20  7:14 PM  Result Value Ref Range   Sodium 135 135 - 145 mmol/L   Potassium 3.8 3.5 - 5.1 mmol/L   Chloride 106 98 - 111 mmol/L   CO2 23 22 - 32 mmol/L   Glucose, Bld 175 (H) 70 - 99 mg/dL   BUN 18 6 - 20 mg/dL   Creatinine, Ser 1.31 (H) 0.61 - 1.24 mg/dL   Calcium 8.9 8.9 - 10.3 mg/dL   Total Protein 5.8 (L) 6.5 - 8.1 g/dL   Albumin 3.1 (L) 3.5 - 5.0 g/dL   AST 22 15 - 41 U/L   ALT 14 0 - 44 U/L   Alkaline Phosphatase 50 38 - 126 U/L   Total Bilirubin 0.7 0.3 - 1.2 mg/dL   GFR, Estimated >60 >60 mL/min   Anion gap 6 5 - 15  CBC WITH DIFFERENTIAL      Status: Abnormal   Collection Time: 09/29/20  7:14 PM  Result Value Ref Range   WBC 9.7 4.0 - 10.5 K/uL   RBC 4.27 4.22 - 5.81 MIL/uL   Hemoglobin 12.1 (L) 13.0 - 17.0 g/dL   HCT 37.1 (L) 39.0 - 52.0 %   MCV 86.9 80.0 - 100.0 fL   MCH 28.3 26.0 - 34.0 pg   MCHC 32.6 30.0 - 36.0 g/dL   RDW 13.2 11.5 - 15.5 %   Platelets 226 150 - 400 K/uL   nRBC 0.0 0.0 - 0.2 %   Neutrophils Relative % 77 %   Neutro Abs 7.5 1.7 - 7.7 K/uL   Lymphocytes Relative 10 %   Lymphs Abs 1.0 0.7 - 4.0 K/uL   Monocytes Relative 8 %   Monocytes Absolute 0.8 0.1 - 1.0 K/uL   Eosinophils Relative 3 %   Eosinophils Absolute 0.3 0.0 - 0.5 K/uL   Basophils Relative 1 %   Basophils Absolute 0.1 0.0 - 0.1 K/uL   Immature Granulocytes 1 %   Abs Immature Granulocytes 0.09 (H) 0.00 - 0.07 K/uL  Protime-INR     Status: None   Collection Time: 09/29/20  7:14 PM  Result Value Ref Range   Prothrombin Time 14.3 11.4 - 15.2 seconds   INR 1.1 0.8 - 1.2  APTT     Status: None   Collection Time: 09/29/20  7:14 PM  Result Value Ref Range   aPTT 25 24 - 36 seconds  Urinalysis, Complete w Microscopic     Status: Abnormal   Collection Time: 09/29/20  7:14 PM  Result Value Ref Range   Color, Urine YELLOW YELLOW   APPearance CLEAR CLEAR   Specific Gravity, Urine >1.030 (H) 1.005 - 1.030   pH 5.5 5.0 - 8.0   Glucose, UA NEGATIVE NEGATIVE mg/dL   Hgb urine dipstick TRACE (A) NEGATIVE   Bilirubin Urine NEGATIVE NEGATIVE  Ketones, ur NEGATIVE NEGATIVE mg/dL   Protein, ur 100 (A) NEGATIVE mg/dL   Nitrite NEGATIVE NEGATIVE   Leukocytes,Ua NEGATIVE NEGATIVE   Squamous Epithelial / LPF 0-5 0 - 5   WBC, UA 0-5 0 - 5 WBC/hpf   RBC / HPF 0-5 0 - 5 RBC/hpf   Bacteria, UA RARE (A) NONE SEEN   Mucus PRESENT   Resp Panel by RT-PCR (Flu A&B, Covid)     Status: None   Collection Time: 09/29/20  7:14 PM   Specimen: Nasopharyngeal(NP) swabs in vial transport medium  Result Value Ref Range   SARS Coronavirus 2 by RT PCR NEGATIVE  NEGATIVE   Influenza A by PCR NEGATIVE NEGATIVE   Influenza B by PCR NEGATIVE NEGATIVE  Acetaminophen level     Status: Abnormal   Collection Time: 09/29/20  7:14 PM  Result Value Ref Range   Acetaminophen (Tylenol), Serum <10 (L) 10 - 30 ug/mL  Salicylate level     Status: Abnormal   Collection Time: 09/29/20  7:14 PM  Result Value Ref Range   Salicylate Lvl [id number] (L) 7.0 - 30.0 mg/dL  Blood gas, arterial     Status: Abnormal   Collection Time: 09/29/20  7:38 PM  Result Value Ref Range   FIO2 70.00    VT 500 mL   Peep/cpap 5.0 cm H20   pH, Arterial 7.35 7.350 - 7.450   pCO2 arterial 37 32.0 - 48.0 mmHg   pO2, Arterial 200 (H) 83.0 - 108.0 mmHg   Bicarbonate 20.4 20.0 - 28.0 mmol/L   Acid-base deficit 4.7 (H) 0.0 - 2.0 mmol/L   O2 Saturation 99.7 %   Patient temperature 37.0    Collection site RIGHT RADIAL    Sample type ARTERIAL DRAW    Allens test (pass/fail) PASS PASS   Mechanical Rate 20    ____________________________________________  EKG My review and personal interpretation at Time: 19:08   Indication: unresopnsive  Rate: 110  Rhythm: sinus Axis: normal Other: prolonged qt, no stemi, nonspecific st abn ____________________________________________  RADIOLOGY  Mix one tablespoon with 8oz of your favorite juice or water every day until you are having soft formed stools. Then start taking once daily if you didn't have a stool the day before.  ____________________________________________   PROCEDURES  Procedure(s) performed:  .Critical Care Performed by: Merlyn Lot, MD Authorized by: Merlyn Lot, MD   Critical care provider statement:    Critical care time (minutes):  35   Critical care time was exclusive of:  Separately billable procedures and treating other patients   Critical care was necessary to treat or prevent imminent or life-threatening deterioration of the following conditions:  CNS failure or compromise and respiratory failure   Critical  care was time spent personally by me on the following activities:  Development of treatment plan with patient or surrogate, discussions with consultants, evaluation of patient's response to treatment, examination of patient, obtaining history from patient or surrogate, ordering and performing treatments and interventions, ordering and review of laboratory studies, ordering and review of radiographic studies, pulse oximetry, re-evaluation of patient's condition and review of old charts Procedure Name: Intubation Date/Time: 09/29/2020 10:27 PM Performed by: Merlyn Lot, MD Pre-anesthesia Checklist: Patient identified, Emergency Drugs available, Suction available and Patient being monitored Preoxygenation: Pre-oxygenation with 100% oxygen Induction Type: IV induction and Rapid sequence Ventilation: Mask ventilation without difficulty Laryngoscope Size: Glidescope and 4 Grade View: Grade III Tube size: 7.5 mm Number of attempts: 1 Airway Equipment and Method: Video-laryngoscopy  Placement Confirmation: ETT inserted through vocal cords under direct vision, CO2 detector and Breath sounds checked- equal and bilateral Secured at: 24 cm Tube secured with: ETT holder Dental Injury: Teeth and Oropharynx as per pre-operative assessment        Critical Care performed: yes ____________________________________________   INITIAL IMPRESSION / ASSESSMENT AND PLAN / ED COURSE  Pertinent labs & imaging results that were available during my care of the patient were reviewed by me and considered in my medical decision making (see chart for details).   DDX: Overdose, electrolyte abnormality, seizure, sepsis, COPD, stroke, IPH  [name] is a 56 y.o. who presents to the ED with presentation as described above.  Patient critically ill-appearing unresponsive hypotensive in route.  Given his hypotension and altered mental status patient was intubated upon arrival to the ER.  Given continued IV  fluids.  Does not seem consistent with sepsis seems most consistent with overdose.  Tylenol level is normal.  Salicylate normal.  CT head reassuring.  While patient was reports positive upon arrival after coming back from CT started having more purposeful movement not actively following commands therefore increased sedation.  Case discussed with ICU for admission.     The patient was evaluated in Emergency Department today for the symptoms described in the history of present illness. He/she was evaluated in the context of the global COVID-19 pandemic, which necessitated consideration that the patient might be at risk for infection with the SARS-CoV-2 virus that causes COVID-19. Institutional protocols and algorithms that pertain to the evaluation of patients at risk for COVID-19 are in a state of rapid change based on information released by regulatory bodies including the CDC and federal and state organizations. These policies and algorithms were followed during the patient's care in the ED.  As part of my medical decision making, I reviewed the following data within the [place] notes reviewed and incorporated, Labs reviewed, notes from prior ED visits and [place] Controlled Substance Database   ____________________________________________   FINAL CLINICAL IMPRESSION(S) / ED DIAGNOSES  Final diagnoses:  Altered mental status, unspecified altered mental status type  Intentional drug overdose, initial encounter ([place])      NEW MEDICATIONS STARTED DURING THIS VISIT:  New Prescriptions   No medications on file     Note:  This document was prepared using Dragon voice recognition software and may include unintentional dictation errors.    Merlyn Lot, MD 09/29/20 2230

## 2020-09-29 NOTE — ED Triage Notes (Addendum)
Pt BIB EMS after pt was found by family member unresponsive. Per EMS, pt took an unknown amount of tizanidine and losartan. Per family member, pt posted on social media about hurting himself. Pt unresponsive upon arrival. Pt received 1g of calcium and 1544m of NS.

## 2020-09-29 NOTE — Progress Notes (Signed)
Patient intubated by ER MD. 7.5 et tube 24 at lip. Placed on vent settings. See flowsheet. ABG obtained. Transported to CT without incident. NP-R[place] called to request tube advancement. Tube advanced to 26 at lip without incident. Vent alarming due to need for sedation titration. RN was at bedside.

## 2020-09-29 NOTE — H&P (Addendum)
NAME:  [name], MRN:  [id number], DOB:  [birth year], LOS: 0 ADMISSION DATE:  09/29/2020, CONSULTATION DATE:  09/29/2020 REFERRING MD:  Dr. Quentin Cornwall, CHIEF COMPLAINT:   AMS, unresponsive  History of Present Illness:  55 year old male presented to [facility] ED on 09/29/2020 via EMS from home after a friend of the family found him unresponsive on his floor for an unknown amount of time.  This friend performed a wellness check on the patient after seeing a post on social media where the patient made comments alluding to suicide.  Per the patient's sister who is bedside, his nephew was murdered this year and very recently a friend of the patient who had been staying with him committed suicide in the patient's bathroom.  His sister reported that after his nephew's death the patient was committed to a mental health facility for time and at baseline has been struggling with depression, anxiety and agoraphobia.  The friend who found the patient reported to EMS that he was found down next to 2 empty pill bottles: losartan and Zanaflex, for which he has prescriptions. Per ED documentation, EMS gave IV fluids for hypotension and calcium in route.  ED course: Patient was emergently intubated requiring mechanical ventilation on arrival with a GCS of 6.  Initial vitals: Afebrile at 98.1, mildly tachypneic at 21, NSR at 84, soft BP 97/66 & SPO2 100% on 70% FiO2. Significant labs: Labs/ Imaging personally reviewed I, Domingo Pulse [place], AGACNP-BC, personally viewed and interpreted this ECG. EKG Interpretation Date: 09/29/2020 EKG Time: 1908 Rate: 108 Rhythm: Sinus tachycardia QRS Axis: Normal Intervals: QTC prolongation at 498 ST/T Wave abnormalities: None Narrative Interpretation: Sinus tachycardia with QTC prolongation Na+/ K+: 135/3.8, AKI - BUN/Cr.:  18/1.31, Serum CO2/ AG: 23/6 Hgb: 12.1, WBC: 9.7, lactic acidosis-2.5, PCT negative, COVID-19 & influenza A/B: Negative, Tylenol and salicylate levels  both negative ABG: 7.35/37/200/20.4 (post intubation) CXR 09/29/2020: Blunting of the costophrenic angle suggests small bilateral effusions CT head without contrast 09/29/2020: Negative for intracranial abnormality  PCCM consulted for admission Pertinent  Medical History  COPD Former smoker: 1-1/2 packs a day for 43 years (quit August 2021) CAD HTN HLD OSA T2DM Depression/ anxiety  Significant Hospital Events: Including procedures, antibiotic start and stop dates in addition to other pertinent events   09/29/20: Patient emergently intubated requiring mechanical ventilation after intentional overdose of Zanaflex & losartan  Interim History / Subjective:  Patient sedated, but asynchronous with ventilator, unable to follow commands. Nursing described patient as "waking up" after intubation agitated pulling at ETT. He was described as not redirectable, and unable to follow commands. Sedation changed from precedex to propofol to keep the patient safe.  Objective   Blood pressure 123/82, pulse 96, temperature 98 F (36.7 C), resp. rate (!) 22, weight (!) 145.2 kg, SpO2 96 %.       No intake or output data in the 24 hours ending 09/29/20 2106 Filed Weights   09/29/20 1938  Weight: (!) 145.2 kg    Examination: General: Adult male, critically ill, lying in bed intubated & sedated requiring mechanical ventilation, NAD HEENT: MM pink/moist, anicteric, atraumatic, neck supple Neuro: sedated, unable to follow commands, PERRL +3, MAE CV: s1s2 RRR, NSR on monitor, no r/m/g Pulm: Regular, non labored on PRVC 70%/ PEEP 5, breath sounds clear-BUL & diminished-BLL GI: soft, rounded, bs x 4 GU: foley in place with cloudy yellow urine Skin: limited exam- bilateral calves discolored, appearance of PVD (no open areas) no rashes/lesions noted Extremities: warm/dry, pulses +  2 R/P, trace edema noted BLE  Resolved Hospital Problem list     Assessment & Plan:  Acute Hypoxic Respiratory Failure  secondary to acute encephalopathy in the setting of intentional drug overdose   PMHx: COPD, former smoker, OSA - Ventilator settings: PRVC  8 mL/kg, 70% FiO2,  PEEP, continue ventilator support & lung protective strategies - Wean PEEP & FiO2 as tolerated, maintain SpO2 > 90% - Head of bed elevated 30 degrees, VAP protocol in place - Plateau pressures less than 30 cm H20  - Intermittent chest x-ray & ABG PRN - Daily WUA with SBT as tolerated  - Ensure adequate pulmonary hygiene  - Held off on IV Steroids as patient does not seem to be having a COPD exacerbation, low threshold to initiate - Budesonide nebs BID, bronchodilators PRN - PAD protocol in place: continue Fentanyl drip & Propofol drip  Intentional Drug Overdose in the setting of suicidal ideation Acute Encephalopathy PMHx: anxiety, depression, agoraphobia (per sister) CTH negative - Poison Control alerted to case, recommendations: continue IVF resuscitation, monitor Qtc, monitor for hypotension/ bradycardia, if the patient's prescribed Wellbutrin was taken incorrectly might see increased agitation/seizures. They agreed with choice of sedatives & overall plan of care - f/u Ekg Q 4, if > 500 replace K+/Mg, goals > 4.2/ 2.1 respectively - IVC in place from ED, consult psychiatry once patient is medically stabilized - outpatient medications on hold: Wellbutrin, oxybutynin, Lexapro, Abilify, gabapentin, Zanaflex  Lactic acidosis Suspected Aspiration Initial PCT negative, no leukocytosis but concern for aspiration d/t unknown amount of time unresponsive at home. - F/u cultures, trend PCT - Continue CAP/Aspiration Pna coverage: Ceftriaxone - continuous cardiac monitoring - received 2 L LR in ED per handoff, will order additional liter followed by continuous IVF at 100 mL/h - f/u lactic, daily CBC- monitor WBC/fever curve - initiate peripheral vasopressors PRN to maintain MAP > 65  Acute Kidney Injury in the setting of Intentional  Overdose  Baseline Cr: 1.2, Cr on admission:1.31 - Strict I/O's: alert provider if UOP < 0.5 mL/kg/hr - gentle IVF hydration  - Daily BMP, replace electrolytes PRN - Avoid nephrotoxic agents as able, ensure adequate renal perfusion - Consult nephrology if iHD or CRRT indicated  - consider renal US if kidney function does not improve with IVF resuscitation  Type 2 Diabetes Mellitus Hemoglobin A1C: pending - Monitor CBG Q 4 hours - SSI moderate dosing - target range while in ICU: 140-180 - follow ICU hyper/hypo-glycemia protocol  CAD PMHx: HLD, HTN - outpatient medications on hold, consider restarting as patient stabilizes  Best Practice (right click and "Reselect all SmartList Selections" daily)  Diet/type: NPO w/ meds via tube DVT prophylaxis: LMWH GI prophylaxis: PPI Lines: N/A Foley:  Yes, and it is still needed Code Status:  full code Last date of multidisciplinary goals of care discussion [09/29/20] Updated sister bedside on plan of care, all questions & concerns answered at this time. Labs   CBC: Recent Labs  Lab 09/29/20 1914  WBC 9.7  NEUTROABS 7.5  HGB 12.1*  HCT 37.1*  MCV 86.9  PLT [id number]    Basic Metabolic Panel: Recent Labs  Lab 09/29/20 1914  NA 135  K 3.8  CL 106  CO2 23  GLUCOSE 175*  BUN 18  CREATININE 1.31*  CALCIUM 8.9   GFR: Estimated Creatinine Clearance (by C-G formula based on SCr of 1.31 mg/dL (H)) Male: 70.7 mL/min (A) Male: 85.6 mL/min (A) Recent Labs  Lab 09/29/20 1914  WBC 9.7  LATICACIDVEN 2.5*    Liver Function Tests: Recent Labs  Lab 09/29/20 1914  AST 22  ALT 14  ALKPHOS 50  BILITOT 0.7  PROT 5.8*  ALBUMIN 3.1*   No results for input(s): LIPASE, AMYLASE in the last 168 hours. No results for input(s): AMMONIA in the last 168 hours.  ABG    Component Value Date/Time   PHART 7.35 09/29/2020 1938   PCO2ART 37 09/29/2020 1938   PO2ART 200 (H) 09/29/2020 1938   HCO3 20.4 09/29/2020 1938   ACIDBASEDEF 4.7 (H)  09/29/2020 1938   O2SAT 99.7 09/29/2020 1938     Coagulation Profile: Recent Labs  Lab 09/29/20 1914  INR 1.1    Cardiac Enzymes: No results for input(s): CKTOTAL, CKMB, CKMBINDEX, TROPONINI in the last 168 hours.  HbA1C: Hgb A1c MFr Bld  Date/Time Value Ref Range Status  06/27/2018 12:39 PM 6.1 (H) 4.8 - 5.6 % Final    Comment:    (NOTE) Pre diabetes:          5.7%-6.4% Diabetes:              >6.4% Glycemic control for   <7.0% adults with diabetes     CBG: No results for input(s): GLUCAP in the last 168 hours.  Review of Systems:   UTA- patient intubated & sedated, unable to interview at this time  Past Medical History:  She,  has a past medical history of COPD (chronic obstructive pulmonary disease) ([place]), Coronary artery disease, Diabetes mellitus without complication ([place]), Hyperlipidemia, Hypertension, Peptic ulcer, and Sleep apnea.   Surgical History:   Past Surgical History:  Procedure Laterality Date   COLONOSCOPY WITH PROPOFOL N/A 11/09/2016   Procedure: COLONOSCOPY WITH PROPOFOL;  Surgeon: Lollie Sails, MD;  Location: [facility] ENDOSCOPY;  Service: Endoscopy;  Laterality: N/A;   COLONOSCOPY WITH PROPOFOL N/A 07/06/2017   Procedure: COLONOSCOPY WITH PROPOFOL;  Surgeon: Lollie Sails, MD;  Location: [facility] ENDOSCOPY;  Service: Endoscopy;  Laterality: N/A;   ESOPHAGOGASTRODUODENOSCOPY (EGD) WITH PROPOFOL N/A 11/09/2016   Procedure: ESOPHAGOGASTRODUODENOSCOPY (EGD) WITH PROPOFOL;  Surgeon: Lollie Sails, MD;  Location: [facility] ENDOSCOPY;  Service: Endoscopy;  Laterality: N/A;   ESOPHAGOGASTRODUODENOSCOPY (EGD) WITH PROPOFOL N/A 07/06/2017   Procedure: ESOPHAGOGASTRODUODENOSCOPY (EGD) WITH PROPOFOL;  Surgeon: Lollie Sails, MD;  Location: [facility] ENDOSCOPY;  Service: Endoscopy;  Laterality: N/A;     Social History:   reports that she quit smoking about 13 months ago. Her smoking use included cigarettes. She has a 22.50 pack-year smoking history. She has  never used smokeless tobacco. She reports that she does not drink alcohol and does not use drugs.   Family History:  Her family history includes Alcohol abuse in her father; Diabetes in her father and mother; Heart attack in her brother, father, paternal aunt, paternal uncle, and sister; Stomach cancer in her father.   Allergies Allergies  Allergen Reactions   Shellfish Allergy Swelling   Codeine Rash   Lisinopril Cough     Home Medications  Prior to Admission medications   Medication Sig Start Date End Date Taking? Authorizing Provider  albuterol (VENTOLIN HFA) 108 (90 Base) MCG/ACT inhaler Inhale 2 puffs into the lungs every 4 (four) hours as needed for wheezing or shortness of breath.     [provider]  ARIPiprazole (ABILIFY) 2 MG tablet Take 1 tablet (2 mg total) by mouth daily. 09/19/20 11/18/20  Norman Clay, MD  aspirin-acetaminophen-caffeine (EXCEDRIN MIGRAINE) [phone] MG tablet Take by mouth every 6 (six) hours as needed  for headache.    [provider]  atorvastatin (LIPITOR) 20 MG tablet Take 20 mg by mouth at bedtime.     [provider]  buPROPion (WELLBUTRIN XL) 300 MG 24 hr tablet Take 1 tablet (300 mg total) by mouth daily. 08/26/20 11/24/20  Norman Clay, MD  Calcium-Magnesium-Vitamin D (CALCIUM 1200+D3 PO) Take by mouth.    [provider]  Cholecalciferol (VITAMIN D) 125 MCG (5000 UT) CAPS Take by mouth.    [provider]  clindamycin (CLEOCIN T) 1 % external solution APPLY TO AFFECTED AREA RASH ON BUTTOCKS1 TIME A WEEK ON SUNDAYS 07/16/20   Ralene Bathe, MD  doxycycline (VIBRA-TABS) 100 MG tablet Take 100 mg by mouth 2 (two) times daily. 07/22/20   [provider]  escitalopram (LEXAPRO) 20 MG tablet Take by mouth.  10/06/19 01/04/20  [provider]  estradiol (ESTRACE) 2 MG tablet Take by mouth. 03/13/20   [provider]  fluconazole (DIFLUCAN) 200 MG tablet Take 1 tablet (200 mg total) by  mouth 3 (three) times a week. 05/31/20   Ralene Bathe, MD  gabapentin (NEURONTIN) 300 MG capsule Take 300 mg by mouth 3 (three) times daily.  09/20/18   [provider]  hydrochlorothiazide (MICROZIDE) 12.5 MG capsule Take 12.5 mg by mouth daily.     [provider]  hydrocortisone 2.5 % lotion APPLY TO AFFECTEDAREA RASH ON BUTTOCKS 3 DAYS PER WEEK. TUESDAY, THURSDAY, SATURDAY, AND AS NEEDED FOR FLARES 07/16/20   Ralene Bathe, MD  Ipratropium-Albuterol (COMBIVENT) 20-100 MCG/ACT AERS respimat Inhale into the lungs. 05/27/20 05/27/21  [provider]  ketoconazole (NIZORAL) 2 % cream Apply to a rash on buttock 3 days a week. On Monday, Wednesday and Friday 04/19/20   [provider]  loratadine (CLARITIN) 10 MG tablet Take 10 mg by mouth daily.    [provider]  losartan (COZAAR) 100 MG tablet Take 1 tablet by mouth daily. 05/13/20   [provider]  losartan (COZAAR) 50 MG tablet Take 1 tablet (50 mg total) by mouth daily. 01/09/16   Loletha Grayer, MD  Magnesium 500 MG CAPS Take 1 capsule (500 mg total) by mouth at bedtime. 11/15/19 02/13/20  Milinda Pointer, MD  meloxicam (MOBIC) 15 MG tablet Take 1 tablet (15 mg total) by mouth daily. 11/15/19 02/13/20  Milinda Pointer, MD  metFORMIN (GLUCOPHAGE) 1000 MG tablet Take 1,000 mg by mouth 2 (two) times daily with a meal.    [provider]  oxybutynin (DITROPAN) 5 MG tablet Take 5 mg by mouth every 12 (twelve) hours.    [provider]  OZEMPIC, 0.25 OR 0.5 MG/DOSE, 2 MG/1.5ML SOPN Inject 0.5 mg into the skin once a week. 04/15/20   [provider]  pantoprazole (PROTONIX) 40 MG tablet Take 40 mg by mouth 2 (two) times daily.     [provider]  phentermine 37.5 MG capsule Take 37.5 mg by mouth every morning.    [provider]  Potassium Gluconate 595 MG CAPS Take by mouth.    [provider]  simethicone (MYLICON) 0000000 MG chewable tablet Chew  125 mg by mouth every 6 (six) hours as needed for flatulence.    [provider]  spironolactone (ALDACTONE) 50 MG tablet Take 50 mg by mouth 2 (two) times daily. 03/13/20   [provider]  tiZANidine (ZANAFLEX) 4 MG tablet Take 1 tablet (4 mg total) by mouth every 8 (eight) hours as needed for muscle spasms.  11/15/19 02/13/20  Milinda Pointer, MD  triamcinolone ointment (KENALOG) 0.1 % APPLY AT BEDTIME EVERYDAY TO FEET AND LOWER LEGS. AVOID FACE,GROIN, AND AXILLA 07/16/20   Ralene Bathe, MD  TRUEPLUS 5-BEVEL PEN NEEDLES 31G X 6 MM [place]  12/28/18   [provider]  umeclidinium-vilanterol (ANORO ELLIPTA) 62.5-25 MCG/INH AEPB Inhale into the lungs. 10/06/19   [provider]  Urea 35 % CREA Apply 1 application topically as directed. Qod to lower legs 05/30/20   Ralene Bathe, MD  Urea 40 % LOTN Apply 1 application topically every other day. 04/24/20   Ralene Bathe, MD  VICTOZA 18 MG/3ML SOPN daily.  12/28/18   [provider]  vitamin E 180 MG (400 UNITS) capsule Take 400 Units by mouth daily.    [provider]     Critical care time: 56 minutes       Venetia Night, AGACNP-BC Acute Care Nurse Practitioner [facility]   [phone] / [phone] Please see Amion for pager details.

## 2020-09-29 NOTE — Progress Notes (Signed)
Anticoagulation monitoring(Lovenox):  55 yo male ordered Lovenox 40 mg Q24h    Filed Weights   09/29/20 1938  Weight: (!) 145.2 kg (320 lb)   BMI 53.2   Lab Results  Component Value Date   CREATININE 1.31 (H) 09/29/2020   CREATININE 1.22 07/22/2020   CREATININE 1.16 03/23/2019   Estimated Creatinine Clearance (by C-G formula based on SCr of 1.31 mg/dL (H)) Male: 70.7 mL/min (A) Male: 85.6 mL/min (A) Hemoglobin & Hematocrit     Component Value Date/Time   HGB 12.1 (L) 09/29/2020 1914   HCT 37.1 (L) 09/29/2020 1914     Per Protocol for Patient with estCrcl > 30 ml/min and BMI > 30, will transition to Lovenox 72.5 mg Q24h.

## 2020-09-29 NOTE — Progress Notes (Signed)
eLink Physician-Brief Progress Note Patient Name: [name] DOB: [birth year] MRN: [id number]   Date of Service  09/29/2020  HPI/Events of Note  Brief HPI: 55 yr old male with hx of COPD, HTN, CAD, OSA admitted to ICU for Intentional OD on tizanidine/ARB on Ventilator  Notes, labs reviewed. EKG latest: qtc 458, improving. Nsprolnged PR interval. LA 2.1 7.35/37/200. CTH neg AG normal. Covid/flu neg. LFT normal.  CxR: possible effusion vs consolidation on left lower zone. Cardiomegaly.  Camera: On PRVC 500 ( VT < 8 ml/ibw)/5. Sats 99%. HR 79. MAP soft. Getting 3 rd lit of fluids as per RN discussion. On fenta gtt. Waking up.    eICU Interventions  - lung protective ventilation - follow poison control advice Sz precautions - on Unasyn for ? Asp pneumonia.  Follow cultures. - keep MAP > 65 - hold BP meds -  VTE: Lovenox. - CBG goals < 180. - SBT/weaning trials daily in am.      Intervention Category Major Interventions: Change in mental status - evaluation and management;Respiratory failure - evaluation and management Evaluation Type: New Patient Evaluation  Elmer Sow 09/29/2020, 11:59 PM

## 2020-09-30 ENCOUNTER — Ambulatory Visit: Payer: Medicare HMO | Admitting: Dermatology

## 2020-09-30 DIAGNOSIS — J9621 Acute and chronic respiratory failure with hypoxia: Secondary | ICD-10-CM

## 2020-09-30 DIAGNOSIS — G9340 Encephalopathy, unspecified: Secondary | ICD-10-CM

## 2020-09-30 DIAGNOSIS — E872 Acidosis, unspecified: Secondary | ICD-10-CM

## 2020-09-30 DIAGNOSIS — F32A Depression, unspecified: Secondary | ICD-10-CM

## 2020-09-30 DIAGNOSIS — T50901A Poisoning by unspecified drugs, medicaments and biological substances, accidental (unintentional), initial encounter: Secondary | ICD-10-CM

## 2020-09-30 DIAGNOSIS — T50902D Poisoning by unspecified drugs, medicaments and biological substances, intentional self-harm, subsequent encounter: Secondary | ICD-10-CM | POA: Diagnosis not present

## 2020-09-30 DIAGNOSIS — Z978 Presence of other specified devices: Secondary | ICD-10-CM

## 2020-09-30 DIAGNOSIS — N179 Acute kidney failure, unspecified: Secondary | ICD-10-CM

## 2020-09-30 DIAGNOSIS — Z9911 Dependence on respirator [ventilator] status: Secondary | ICD-10-CM

## 2020-09-30 DIAGNOSIS — R45851 Suicidal ideations: Secondary | ICD-10-CM

## 2020-09-30 HISTORY — DX: Dependence on respirator (ventilator) status: Z99.11

## 2020-09-30 HISTORY — DX: Presence of other specified devices: Z97.8

## 2020-09-30 LAB — HEMOGLOBIN A1C
Hgb A1c MFr Bld: 5.4 % (ref 4.8–5.6)
Mean Plasma Glucose: 108.28 mg/dL

## 2020-09-30 LAB — BLOOD GAS, ARTERIAL
Acid-base deficit: 6.2 mmol/L — ABNORMAL HIGH (ref 0.0–2.0)
Bicarbonate: 19.6 mmol/L — ABNORMAL LOW (ref 20.0–28.0)
FIO2: 0.28
MECHVT: 500 mL
Mechanical Rate: 20
O2 Saturation: 97.4 %
PEEP: 5 cmH2O
Patient temperature: 37
RATE: 20 resp/min
pCO2 arterial: 39 mmHg (ref 32.0–48.0)
pH, Arterial: 7.31 — ABNORMAL LOW (ref 7.350–7.450)
pO2, Arterial: 103 mmHg (ref 83.0–108.0)

## 2020-09-30 LAB — CBC
HCT: 38.5 % — ABNORMAL LOW (ref 39.0–52.0)
Hemoglobin: 12.6 g/dL — ABNORMAL LOW (ref 13.0–17.0)
MCH: 29.2 pg (ref 26.0–34.0)
MCHC: 32.7 g/dL (ref 30.0–36.0)
MCV: 89.3 fL (ref 80.0–100.0)
Platelets: 311 10*3/uL (ref 150–400)
RBC: 4.31 MIL/uL (ref 4.22–5.81)
RDW: 13.2 % (ref 11.5–15.5)
WBC: 13.9 10*3/uL — ABNORMAL HIGH (ref 4.0–10.5)
nRBC: 0 % (ref 0.0–0.2)

## 2020-09-30 LAB — COMPREHENSIVE METABOLIC PANEL
ALT: 14 U/L (ref 0–44)
AST: 18 U/L (ref 15–41)
Albumin: 3.2 g/dL — ABNORMAL LOW (ref 3.5–5.0)
Alkaline Phosphatase: 48 U/L (ref 38–126)
Anion gap: 7 (ref 5–15)
BUN: 22 mg/dL — ABNORMAL HIGH (ref 6–20)
CO2: 19 mmol/L — ABNORMAL LOW (ref 22–32)
Calcium: 8.2 mg/dL — ABNORMAL LOW (ref 8.9–10.3)
Chloride: 108 mmol/L (ref 98–111)
Creatinine, Ser: 2.1 mg/dL — ABNORMAL HIGH (ref 0.61–1.24)
GFR, Estimated: 36 mL/min — ABNORMAL LOW (ref >60)
Glucose, Bld: 122 mg/dL — ABNORMAL HIGH (ref 70–99)
Potassium: 4.4 mmol/L (ref 3.5–5.1)
Sodium: 134 mmol/L — ABNORMAL LOW (ref 135–145)
Total Bilirubin: 0.6 mg/dL (ref 0.3–1.2)
Total Protein: 5.8 g/dL — ABNORMAL LOW (ref 6.5–8.1)

## 2020-09-30 LAB — GLUCOSE, CAPILLARY
Glucose-Capillary: 128 mg/dL — ABNORMAL HIGH (ref 70–99)
Glucose-Capillary: 114 mg/dL — ABNORMAL HIGH (ref 70–99)
Glucose-Capillary: 136 mg/dL — ABNORMAL HIGH (ref 70–99)
Glucose-Capillary: 104 mg/dL — ABNORMAL HIGH (ref 70–99)
Glucose-Capillary: 96 mg/dL (ref 70–99)
Glucose-Capillary: 112 mg/dL — ABNORMAL HIGH (ref 70–99)
Glucose-Capillary: 111 mg/dL — ABNORMAL HIGH (ref 70–99)

## 2020-09-30 LAB — PHOSPHORUS: Phosphorus: 4.3 mg/dL (ref 2.5–4.6)

## 2020-09-30 LAB — MRSA NEXT GEN BY PCR, NASAL: MRSA by PCR Next Gen: NOT DETECTED

## 2020-09-30 LAB — PROCALCITONIN
Procalcitonin: <0.1 ng/mL
Procalcitonin: <0.1 ng/mL

## 2020-09-30 LAB — LACTIC ACID, PLASMA: Lactic Acid, Venous: 1.2 mmol/L (ref 0.5–1.9)

## 2020-09-30 LAB — ACETAMINOPHEN LEVEL: Acetaminophen (Tylenol), Serum: <10 ug/mL — ABNORMAL LOW (ref 10–30)

## 2020-09-30 LAB — HIV ANTIBODY (ROUTINE TESTING W REFLEX): HIV Screen 4th Generation wRfx: NONREACTIVE

## 2020-09-30 LAB — TRIGLYCERIDES: Triglycerides: 467 mg/dL — ABNORMAL HIGH (ref <150)

## 2020-09-30 LAB — MAGNESIUM: Magnesium: 1.8 mg/dL (ref 1.7–2.4)

## 2020-09-30 MED ORDER — TRIAMCINOLONE ACETONIDE 0.1 % EX OINT
TOPICAL_OINTMENT | Freq: Every evening | CUTANEOUS | Status: DC | PRN
  Filled 2020-09-30: qty 15

## 2020-09-30 MED ORDER — SIMETHICONE 80 MG PO CHEW
80.0000 mg | CHEWABLE_TABLET | Freq: Four times a day (QID) | ORAL | Status: DC | PRN
  Administered 2020-10-01 – 2020-10-02 (×2): 80 mg via ORAL
  Filled 2020-09-30 (×4): qty 1

## 2020-09-30 MED ORDER — IPRATROPIUM-ALBUTEROL 0.5-2.5 (3) MG/3ML IN SOLN: 3.0000 mL | Freq: Four times a day (QID) | RESPIRATORY_TRACT | Status: DC | PRN

## 2020-09-30 MED ORDER — BUTALBITAL-APAP-CAFFEINE 50-325-40 MG PO TABS
1.0000 | ORAL_TABLET | Freq: Four times a day (QID) | ORAL | Status: DC | PRN
  Administered 2020-10-01 – 2020-10-03 (×5): 1 via ORAL
  Filled 2020-09-30 (×5): qty 1

## 2020-09-30 MED ORDER — MAGNESIUM SULFATE 2 GM/50ML IV SOLN
2.0000 g | Freq: Once | INTRAVENOUS | Status: AC
  Administered 2020-09-30: 2 g via INTRAVENOUS
  Filled 2020-09-30: qty 50

## 2020-09-30 MED ORDER — SODIUM CHLORIDE 0.9 % IV SOLN
1.0000 g | Freq: Two times a day (BID) | INTRAVENOUS | Status: DC
  Administered 2020-09-30 – 2020-10-02 (×6): 1 g via INTRAVENOUS
  Filled 2020-09-30: qty 1
  Filled 2020-09-30: qty 10
  Filled 2020-09-30: qty 1
  Filled 2020-09-30 (×3): qty 10
  Filled 2020-09-30 (×2): qty 1

## 2020-09-30 MED ORDER — ONDANSETRON HCL 4 MG/2ML IJ SOLN
INTRAMUSCULAR | Status: AC
  Filled 2020-09-30: qty 2

## 2020-09-30 MED ORDER — INSULIN ASPART 100 UNIT/ML IJ SOLN
0.0000 [IU] | Freq: Three times a day (TID) | INTRAMUSCULAR | Status: DC
  Filled 2020-09-30: qty 1

## 2020-09-30 MED ORDER — BUDESONIDE 0.25 MG/2ML IN SUSP
0.2500 mg | Freq: Two times a day (BID) | RESPIRATORY_TRACT | Status: DC
  Administered 2020-09-30: 0.25 mg via RESPIRATORY_TRACT
  Filled 2020-09-30: qty 2

## 2020-09-30 MED ORDER — ALBUTEROL SULFATE (2.5 MG/3ML) 0.083% IN NEBU: 3.0000 mL | INHALATION_SOLUTION | RESPIRATORY_TRACT | Status: DC | PRN

## 2020-09-30 MED ORDER — UMECLIDINIUM-VILANTEROL 62.5-25 MCG/INH IN AEPB
1.0000 | INHALATION_SPRAY | Freq: Every day | RESPIRATORY_TRACT | Status: DC
  Administered 2020-10-01 – 2020-10-03 (×3): 1 via RESPIRATORY_TRACT
  Filled 2020-09-30 (×2): qty 14

## 2020-09-30 MED ORDER — LORATADINE 10 MG PO TABS
10.0000 mg | ORAL_TABLET | Freq: Every day | ORAL | Status: DC
  Administered 2020-09-30 – 2020-10-03 (×4): 10 mg via ORAL
  Filled 2020-09-30 (×4): qty 1

## 2020-09-30 MED ORDER — DOCUSATE SODIUM 100 MG PO CAPS
100.0000 mg | ORAL_CAPSULE | Freq: Two times a day (BID) | ORAL | Status: DC
  Administered 2020-09-30 – 2020-10-03 (×5): 100 mg via ORAL
  Filled 2020-09-30 (×6): qty 1

## 2020-09-30 MED ORDER — ONDANSETRON HCL 4 MG/2ML IJ SOLN
4.0000 mg | Freq: Once | INTRAMUSCULAR | Status: AC
  Administered 2020-09-30: 4 mg via INTRAVENOUS

## 2020-09-30 MED ORDER — ATORVASTATIN CALCIUM 20 MG PO TABS
20.0000 mg | ORAL_TABLET | Freq: Every day | ORAL | Status: DC
  Administered 2020-09-30 – 2020-10-02 (×3): 20 mg via ORAL
  Filled 2020-09-30 (×3): qty 1

## 2020-09-30 NOTE — Consult Note (Addendum)
[facility] Consult   Reason for Consult: Consult for 55 year old male, who prefers She/her pronouns who overdosed on prescribed medications. Referring Physician: Erin Fulling, MD  Patient Identification: [name] MRN:  [id number] Principal Diagnosis: Overdose Diagnosis:  Principal Problem:   Overdose Active Problems:   Obesity   Chronic low back pain (Bilateral) (R>L) w/ sciatica (Bilateral)   DM2 (diabetes mellitus, type 2) (HCC)   Venous stasis dermatitis of both lower extremities   Suicidal ideation   Acute on chronic respiratory failure with hypoxia (HCC)   Endotracheally intubated   On mechanically assisted ventilation (HCC)   AKI (acute kidney injury) ([place])   Acute encephalopathy   Lactic acidosis   Depression   Total Time spent with patient: 45 minutes  Subjective: "I will never do that again." [name] is a 55 y.o. adult patient admitted with intentional overdose of tizanidine and losartan.  Patient was seen and chart reviewed.  Patient was pleasant and cooperative.  Alert and oriented x4.  He was sitting up in bed., with his sisters visiting. Sisters left the room during interview.  Patient states that he was very sad over "someone I care very much about," overdosing on heroin at patient's home.  This happened on 9/15.  Patient took the overdose of medication 9/18.  Patient states that he tried to get people to stay with him after his friend died and nobody would come.  He states this left him feeling hopeless and he did this impulsively.  Patient states that he does have a psychiatrist that he sees on an outpatient basis here at [facility].  He is due to see a therapist Altha Harm) later this week.  Patient currently takes Abilify and bupropion x3 months.  He states that he was psychiatrically hospitalized in 2019 after his nephew was murdered and he threatened to kill that person and himself.  Patient has been on disability for 2 years due to "back and leg  problems."  Patient states that he is very glad to be on line and that he feels that God spoke to him when he was unconscious and told him he needs to be in this world.  He states that "my sister is the one who brought me back."  He feels that he has work yet to do on this earth and that his sister needs him as well.  He describes feeling abandoned after nobody would come and stay with him when his friend died, but now sees that this was a bad choice.  Patient expresses forward thinking behavior, stating that he wants to "help other people who get depressed."  He identifies his sisters, his nephew, cooking, crocheting, needlepoint, as things that bring him joy in life.  He denies current suicidal ideation, denies homicidal ideation, paranoia, auditory or visual hallucinations.  Patient expresses that he does not feel psychiatric admission after medically cleared would be helpful, as he does have outpatient follow-up.  Writer discussed discussed that we will continue to follow him and reassess. Patient is agreeable. Patient remains on IVC and has 1:1 sitter for safety.Patient has not had any incidents of self-harming or concerning behaviors.      Past Psychiatric History: Depression  Risk to Self:   Risk to Others:   Prior Inpatient Therapy:   Prior Outpatient Therapy:    Past Medical History:  Past Medical History:  Diagnosis Date   COPD (chronic obstructive pulmonary disease) ([place])    Coronary artery disease    Diabetes mellitus  without complication ([place])    Hyperlipidemia    Hypertension    Peptic ulcer    Sleep apnea     Past Surgical History:  Procedure Laterality Date   COLONOSCOPY WITH PROPOFOL N/A 11/09/2016   Procedure: COLONOSCOPY WITH PROPOFOL;  Surgeon: Lollie Sails, MD;  Location: [facility] ENDOSCOPY;  Service: Endoscopy;  Laterality: N/A;   COLONOSCOPY WITH PROPOFOL N/A 07/06/2017   Procedure: COLONOSCOPY WITH PROPOFOL;  Surgeon: Lollie Sails, MD;  Location: [facility]  ENDOSCOPY;  Service: Endoscopy;  Laterality: N/A;   ESOPHAGOGASTRODUODENOSCOPY (EGD) WITH PROPOFOL N/A 11/09/2016   Procedure: ESOPHAGOGASTRODUODENOSCOPY (EGD) WITH PROPOFOL;  Surgeon: Lollie Sails, MD;  Location: [facility] ENDOSCOPY;  Service: Endoscopy;  Laterality: N/A;   ESOPHAGOGASTRODUODENOSCOPY (EGD) WITH PROPOFOL N/A 07/06/2017   Procedure: ESOPHAGOGASTRODUODENOSCOPY (EGD) WITH PROPOFOL;  Surgeon: Lollie Sails, MD;  Location: [facility] ENDOSCOPY;  Service: Endoscopy;  Laterality: N/A;   Family History:  Family History  Problem Relation Age of Onset   Diabetes Mother    Diabetes Father    Stomach cancer Father    Heart attack Father    Alcohol abuse Father    Heart attack Sister    Heart attack Brother    Heart attack Paternal Aunt    Heart attack Paternal Uncle    Family Psychiatric  History: none known Social History:  Social History   Substance and Sexual Activity  Alcohol Use No     Social History   Substance and Sexual Activity  Drug Use No    Social History   Socioeconomic History   Marital status: Single    Spouse name: Not on file   Number of children: Not on file   Years of education: Not on file   Highest education level: Not on file  Occupational History   Not on file  Tobacco Use   Smoking status: Former    Packs/day: 0.75    Years: 30.00    Pack years: 22.50    Types: Cigarettes    Quit date: 08/13/2019    Years since quitting: 1.1   Smokeless tobacco: Never   Tobacco comments:    trying decrease  Vaping Use   Vaping Use: Never used  Substance and Sexual Activity   Alcohol use: No   Drug use: No   Sexual activity: Not Currently  Other Topics Concern   Not on file  Social History Narrative   Not on file   Social Determinants of Health   Financial Resource Strain: Not on file  Food Insecurity: Not on file  Transportation Needs: Not on file  Physical Activity: Not on file  Stress: Not on file  Social Connections: Not on file    Additional Social History:    Allergies:   Allergies  Allergen Reactions   Shellfish Allergy Swelling   Codeine Rash   Lisinopril Cough    Labs:  Results for orders placed or performed during the hospital encounter of 09/29/20 (from the past 48 hour(s))  Lactic acid, plasma     Status: Abnormal   Collection Time: 09/29/20  7:14 PM  Result Value Ref Range   Lactic Acid, Venous 2.5 (HH) 0.5 - 1.9 mmol/L    Comment: CRITICAL RESULT CALLED TO, READ BACK BY AND VERIFIED WITH [place] 09/29/2020 DLB Performed at [facility], [address] [place],  [address]   Comprehensive metabolic panel     Status: Abnormal   Collection Time: 09/29/20  7:14 PM  Result Value Ref Range  Sodium 135 135 - 145 mmol/L   Potassium 3.8 3.5 - 5.1 mmol/L   Chloride 106 98 - 111 mmol/L   CO2 23 22 - 32 mmol/L   Glucose, Bld 175 (H) 70 - 99 mg/dL    Comment: Glucose reference range applies only to samples taken after fasting for at least 8 hours.   BUN 18 6 - 20 mg/dL   Creatinine, Ser 1.31 (H) 0.61 - 1.24 mg/dL   Calcium 8.9 8.9 - 10.3 mg/dL   Total Protein 5.8 (L) 6.5 - 8.1 g/dL   Albumin 3.1 (L) 3.5 - 5.0 g/dL   AST 22 15 - 41 U/L   ALT 14 0 - 44 U/L   Alkaline Phosphatase 50 38 - 126 U/L   Total Bilirubin 0.7 0.3 - 1.2 mg/dL   GFR, Estimated >60 >60 mL/min    Comment: (NOTE) Calculated using the CKD-EPI Creatinine Equation (2021)    Anion gap 6 5 - 15    Comment: Performed at [facility], [address] [place], [place] [address]  CBC WITH DIFFERENTIAL     Status: Abnormal   Collection Time: 09/29/20  7:14 PM  Result Value Ref Range   WBC 9.7 4.0 - 10.5 K/uL   RBC 4.27 4.22 - 5.81 MIL/uL   Hemoglobin 12.1 (L) 13.0 - 17.0 g/dL   HCT 37.1 (L) 39.0 - 52.0 %   MCV 86.9 80.0 - 100.0 fL   MCH 28.3 26.0 - 34.0 pg   MCHC 32.6 30.0 - 36.0 g/dL   RDW 13.2 11.5 - 15.5 %   Platelets 226 150 - 400 K/uL   nRBC 0.0 0.0 - 0.2 %   Neutrophils Relative % 77 %    Neutro Abs 7.5 1.7 - 7.7 K/uL   Lymphocytes Relative 10 %   Lymphs Abs 1.0 0.7 - 4.0 K/uL   Monocytes Relative 8 %   Monocytes Absolute 0.8 0.1 - 1.0 K/uL   Eosinophils Relative 3 %   Eosinophils Absolute 0.3 0.0 - 0.5 K/uL   Basophils Relative 1 %   Basophils Absolute 0.1 0.0 - 0.1 K/uL   Immature Granulocytes 1 %   Abs Immature Granulocytes 0.09 (H) 0.00 - 0.07 K/uL    Comment: Performed at [facility], [address] [place], [place] [address]  Protime-INR     Status: None   Collection Time: 09/29/20  7:14 PM  Result Value Ref Range   Prothrombin Time 14.3 11.4 - 15.2 seconds   INR 1.1 0.8 - 1.2    Comment: (NOTE) INR goal varies based on device and disease states. Performed at [facility], [address] [place], [place] [address]   APTT     Status: None   Collection Time: 09/29/20  7:14 PM  Result Value Ref Range   aPTT 25 24 - 36 seconds    Comment: Performed at [facility], [address] [place], [place] [address]  Blood culture (routine single)     Status: None (Preliminary result)   Collection Time: 09/29/20  7:14 PM   Specimen: BLOOD  Result Value Ref Range   Specimen Description BLOOD BLOOD LEFT FOREARM    Special Requests      BOTTLES DRAWN AEROBIC AND ANAEROBIC Blood Culture adequate volume   Culture      NO GROWTH < 12 HOURS Performed at [facility], [address] [place], [place] [address]    Report Status PENDING   Urinalysis, Complete w Microscopic  Status: Abnormal   Collection Time: 09/29/20  7:14 PM  Result Value Ref Range   Color, Urine YELLOW YELLOW   APPearance CLEAR CLEAR   Specific Gravity, Urine >1.030 (H) 1.005 - 1.030   pH 5.5 5.0 - 8.0   Glucose, UA NEGATIVE NEGATIVE mg/dL   Hgb urine dipstick TRACE (A) NEGATIVE   Bilirubin Urine NEGATIVE NEGATIVE   Ketones, ur NEGATIVE NEGATIVE mg/dL   Protein, ur 100 (A) NEGATIVE mg/dL   Nitrite NEGATIVE NEGATIVE   Leukocytes,Ua NEGATIVE NEGATIVE    Squamous Epithelial / LPF 0-5 0 - 5   WBC, UA 0-5 0 - 5 WBC/hpf   RBC / HPF 0-5 0 - 5 RBC/hpf   Bacteria, UA RARE (A) NONE SEEN   Mucus PRESENT     Comment: Performed at [facility], [address], [place], [place] [address]  Resp Panel by RT-PCR (Flu A&B, Covid)     Status: None   Collection Time: 09/29/20  7:14 PM   Specimen: Nasopharyngeal(NP) swabs in vial transport medium  Result Value Ref Range   SARS Coronavirus 2 by RT PCR NEGATIVE NEGATIVE    Comment: (NOTE) SARS-CoV-2 target nucleic acids are NOT DETECTED.  The SARS-CoV-2 RNA is generally detectable in upper respiratory specimens during the acute phase of infection. The lowest concentration of SARS-CoV-2 viral copies this assay can detect is 138 copies/mL. A negative result does not preclude SARS-Cov-2 infection and should not be used as the sole basis for treatment or other patient management decisions. A negative result may occur with  improper specimen collection/handling, submission of specimen other than nasopharyngeal swab, presence of viral mutation(s) within the areas targeted by this assay, and inadequate number of viral copies(<138 copies/mL). A negative result must be combined with clinical observations, patient history, and epidemiological information. The expected result is Negative.  Fact Sheet for Patients:  EntrepreneurPulse.com.au  Fact Sheet for Healthcare Providers:  IncredibleEmployment.be  This test is no t yet approved or cleared by the [place] FDA and  has been authorized for detection and/or diagnosis of SARS-CoV-2 by FDA under an Emergency Use Authorization (EUA). This EUA will remain  in effect (meaning this test can be used) for the duration of the COVID-19 declaration under Section 564(b)(1) of the Act, 21 U.S.C.section 360bbb-3(b)(1), unless the authorization is terminated  or revoked sooner.       Influenza A by PCR NEGATIVE  NEGATIVE   Influenza B by PCR NEGATIVE NEGATIVE    Comment: (NOTE) The Xpert Xpress SARS-CoV-2/FLU/RSV plus assay is intended as an aid in the diagnosis of influenza from Nasopharyngeal swab specimens and should not be used as a sole basis for treatment. Nasal washings and aspirates are unacceptable for Xpert Xpress SARS-CoV-2/FLU/RSV testing.  Fact Sheet for Patients: EntrepreneurPulse.com.au  Fact Sheet for Healthcare Providers: IncredibleEmployment.be  This test is not yet approved or cleared by the [place] FDA and has been authorized for detection and/or diagnosis of SARS-CoV-2 by FDA under an Emergency Use Authorization (EUA). This EUA will remain in effect (meaning this test can be used) for the duration of the COVID-19 declaration under Section 564(b)(1) of the Act, 21 U.S.C. section 360bbb-3(b)(1), unless the authorization is terminated or revoked.  Performed at [facility], [address] [place], [place] [address]   Acetaminophen level     Status: Abnormal   Collection Time: 09/29/20  7:14 PM  Result Value Ref Range   Acetaminophen (Tylenol), Serum <10 (L) 10 - 30 ug/mL  Comment: (NOTE) Therapeutic concentrations vary significantly. A range of 10-30 ug/mL  may be an effective concentration for many patients. However, some  are best treated at concentrations outside of this range. Acetaminophen concentrations >150 ug/mL at 4 hours after ingestion  and >50 ug/mL at 12 hours after ingestion are often associated with  toxic reactions.  Performed at [facility], [address] [place], [place] [id number]   Salicylate level     Status: Abnormal   Collection Time: 09/29/20  7:14 PM  Result Value Ref Range   Salicylate Lvl Q000111Q (L) 7.0 - 30.0 mg/dL    Comment: Performed at [facility], [address] [place], [place] [address]  Urine Drug Screen, Qualitative ([facility] only)     Status: None    Collection Time: 09/29/20  7:14 PM  Result Value Ref Range   Tricyclic, Ur Screen NONE DETECTED NONE DETECTED   Amphetamines, Ur Screen NONE DETECTED NONE DETECTED   MDMA (Ecstasy)Ur Screen NONE DETECTED NONE DETECTED   Cocaine Metabolite,Ur [place] NONE DETECTED NONE DETECTED   Opiate, Ur Screen NONE DETECTED NONE DETECTED   Phencyclidine (PCP) Ur S NONE DETECTED NONE DETECTED   Cannabinoid 50 Ng, Ur [place] NONE DETECTED NONE DETECTED   Barbiturates, Ur Screen NONE DETECTED NONE DETECTED   Benzodiazepine, Ur Scrn NONE DETECTED NONE DETECTED   Methadone Scn, Ur NONE DETECTED NONE DETECTED    Comment: (NOTE) Tricyclics + metabolites, urine    Cutoff 1000 ng/mL Amphetamines + metabolites, urine  Cutoff 1000 ng/mL MDMA (Ecstasy), urine              Cutoff 500 ng/mL Cocaine Metabolite, urine          Cutoff 300 ng/mL Opiate + metabolites, urine        Cutoff 300 ng/mL Phencyclidine (PCP), urine         Cutoff 25 ng/mL Cannabinoid, urine                 Cutoff 50 ng/mL Barbiturates + metabolites, urine  Cutoff 200 ng/mL Benzodiazepine, urine              Cutoff 200 ng/mL Methadone, urine                   Cutoff 300 ng/mL  The urine drug screen provides only a preliminary, unconfirmed analytical test result and should not be used for non-medical purposes. Clinical consideration and professional judgment should be applied to any positive drug screen result due to possible interfering substances. A more specific alternate chemical method must be used in order to obtain a confirmed analytical result. Gas chromatography / mass spectrometry (GC/MS) is the preferred confirm atory method. Performed at [facility], [address] [place], [place] [address]   Magnesium     Status: None   Collection Time: 09/29/20  7:14 PM  Result Value Ref Range   Magnesium 1.9 1.7 - 2.4 mg/dL    Comment: Performed at [facility], [address] [place], [place] [address]  Phosphorus      Status: None   Collection Time: 09/29/20  7:14 PM  Result Value Ref Range   Phosphorus 2.8 2.5 - 4.6 mg/dL    Comment: Performed at [facility], [address] [place],  [address]  Procalcitonin - Baseline     Status: None   Collection Time: 09/29/20  7:14 PM  Result Value Ref Range   Procalcitonin <0.10 ng/mL    Comment:  Interpretation: PCT (Procalcitonin) <= 0.5 ng/mL: Systemic infection (sepsis) is not likely. Local bacterial infection is possible. (NOTE)       Sepsis PCT Algorithm           Lower Respiratory Tract                                      Infection PCT Algorithm    ----------------------------     ----------------------------         PCT < 0.25 ng/mL                PCT < 0.10 ng/mL          Strongly encourage             Strongly discourage   discontinuation of antibiotics    initiation of antibiotics    ----------------------------     -----------------------------       PCT 0.25 - 0.50 ng/mL            PCT 0.10 - 0.25 ng/mL               OR       >80% decrease in PCT            Discourage initiation of                                            antibiotics      Encourage discontinuation           of antibiotics    ----------------------------     -----------------------------         PCT >= 0.50 ng/mL              PCT 0.26 - 0.50 ng/mL               AND        <80% decrease in PCT             Encourage initiation of                                             antibiotics       Encourage continuation           of antibiotics    ----------------------------     -----------------------------        PCT >= 0.50 ng/mL                  PCT > 0.50 ng/mL               AND         increase in PCT                  Strongly encourage                                      initiation of antibiotics    Strongly encourage escalation           of antibiotics                                     -----------------------------  PCT <= 0.25 ng/mL                                                 OR                                        > 80% decrease in PCT                                      Discontinue / Do not initiate                                             antibiotics  Performed at [facility], [address] [place], [place] [address]   Blood gas, arterial     Status: Abnormal   Collection Time: 09/29/20  7:38 PM  Result Value Ref Range   FIO2 70.00    VT 500 mL   Peep/cpap 5.0 cm H20   pH, Arterial 7.35 7.350 - 7.450   pCO2 arterial 37 32.0 - 48.0 mmHg   pO2, Arterial 200 (H) 83.0 - 108.0 mmHg   Bicarbonate 20.4 20.0 - 28.0 mmol/L   Acid-base deficit 4.7 (H) 0.0 - 2.0 mmol/L   O2 Saturation 99.7 %   Patient temperature 37.0    Collection site RIGHT RADIAL    Sample type ARTERIAL DRAW    Allens test (pass/fail) PASS PASS   Mechanical Rate 20     Comment: Performed at [facility], [address] [place], [place] [address]  Lactic acid, plasma     Status: Abnormal   Collection Time: 09/29/20 10:00 PM  Result Value Ref Range   Lactic Acid, Venous 2.1 (HH) 0.5 - 1.9 mmol/L    Comment: CRITICAL VALUE NOTED. VALUE IS CONSISTENT WITH PREVIOUSLY REPORTED/CALLED VALUE DLB Performed at [facility], [address] [place], [place] [address]   HIV Antibody (routine testing w rflx)     Status: None   Collection Time: 09/29/20 10:00 PM  Result Value Ref Range   HIV Screen 4th Generation wRfx Non Reactive Non Reactive    Comment: Performed at [facility], [place] [address] [place], [place] [address]  Glucose, capillary     Status: Abnormal   Collection Time: 09/29/20 11:31 PM  Result Value Ref Range   Glucose-Capillary 136 (H) 70 - 99 mg/dL    Comment: Glucose reference range applies only to samples taken after fasting for at least 8 hours.  MRSA Next Gen by PCR, Nasal     Status: None   Collection Time: 09/30/20 12:10 AM   Specimen: Nasal  Mucosa; Nasal Swab  Result Value Ref Range   MRSA by PCR Next Gen NOT DETECTED NOT DETECTED    Comment: (NOTE) The GeneXpert MRSA Assay (FDA approved for NASAL specimens only), is one component of a comprehensive MRSA colonization surveillance program. It is not intended to diagnose MRSA infection nor to guide or monitor treatment for MRSA infections. Test performance is not FDA approved in patients less than 80 years old. Performed  at [facility], [address] [place], [place] [address]   Blood gas, arterial     Status: Abnormal   Collection Time: 09/30/20  5:08 AM  Result Value Ref Range   FIO2 0.28    Delivery systems VENTILATOR    Mode PRESSURE REGULATED VOLUME CONTROL    VT 500 mL   LHR 20 resp/min   Peep/cpap 5.0 cm H20   pH, Arterial 7.31 (L) 7.350 - 7.450   pCO2 arterial 39 32.0 - 48.0 mmHg   pO2, Arterial 103 83.0 - 108.0 mmHg   Bicarbonate 19.6 (L) 20.0 - 28.0 mmol/L   Acid-base deficit 6.2 (H) 0.0 - 2.0 mmol/L   O2 Saturation 97.4 %   Patient temperature 37.0    Collection site RIGHT RADIAL    Sample type ARTERIAL DRAW    Allens test (pass/fail) PASS PASS   Mechanical Rate 20     Comment: Performed at [facility], [address] [place], [place] [address]  Glucose, capillary     Status: Abnormal   Collection Time: 09/30/20  5:12 AM  Result Value Ref Range   Glucose-Capillary 128 (H) 70 - 99 mg/dL    Comment: Glucose reference range applies only to samples taken after fasting for at least 8 hours.  Hemoglobin A1c     Status: None   Collection Time: 09/30/20  5:46 AM  Result Value Ref Range   Hgb A1c MFr Bld 5.4 4.8 - 5.6 %    Comment: (NOTE) Pre diabetes:          5.7%-6.4%  Diabetes:              >6.4%  Glycemic control for   <7.0% adults with diabetes    Mean Plasma Glucose 108.28 mg/dL    Comment: Performed at [facility] [address], [place], [place] [address]  CBC     Status: Abnormal   Collection Time: 09/30/20   5:46 AM  Result Value Ref Range   WBC 13.9 (H) 4.0 - 10.5 K/uL   RBC 4.31 4.22 - 5.81 MIL/uL   Hemoglobin 12.6 (L) 13.0 - 17.0 g/dL   HCT 38.5 (L) 39.0 - 52.0 %   MCV 89.3 80.0 - 100.0 fL   MCH 29.2 26.0 - 34.0 pg   MCHC 32.7 30.0 - 36.0 g/dL   RDW 13.2 11.5 - 15.5 %   Platelets 311 150 - 400 K/uL   nRBC 0.0 0.0 - 0.2 %    Comment: Performed at [facility], [address], [place], [place] [address]  Magnesium     Status: None   Collection Time: 09/30/20  5:46 AM  Result Value Ref Range   Magnesium 1.8 1.7 - 2.4 mg/dL    Comment: Performed at [facility], [address], [place], [place] [address]  Phosphorus     Status: None   Collection Time: 09/30/20  5:46 AM  Result Value Ref Range   Phosphorus 4.3 2.5 - 4.6 mg/dL    Comment: Performed at [facility], [address] [place], [place] [address]  Comprehensive metabolic panel     Status: Abnormal   Collection Time: 09/30/20  5:46 AM  Result Value Ref Range   Sodium 134 (L) 135 - 145 mmol/L   Potassium 4.4 3.5 - 5.1 mmol/L   Chloride 108 98 - 111 mmol/L   CO2 19 (L) 22 - 32 mmol/L   Glucose, Bld 122 (H) 70 - 99 mg/dL  Comment: Glucose reference range applies only to samples taken after fasting for at least 8 hours.   BUN 22 (H) 6 - 20 mg/dL   Creatinine, Ser 2.10 (H) 0.61 - 1.24 mg/dL   Calcium 8.2 (L) 8.9 - 10.3 mg/dL   Total Protein 5.8 (L) 6.5 - 8.1 g/dL   Albumin 3.2 (L) 3.5 - 5.0 g/dL   AST 18 15 - 41 U/L   ALT 14 0 - 44 U/L   Alkaline Phosphatase 48 38 - 126 U/L   Total Bilirubin 0.6 0.3 - 1.2 mg/dL   GFR, Estimated 36 (L) >60 mL/min    Comment: (NOTE) Calculated using the CKD-EPI Creatinine Equation (2021)    Anion gap 7 5 - 15    Comment: Performed at [facility], [address] [place], [place] [address]  Triglycerides     Status: Abnormal   Collection Time: 09/30/20  5:46 AM  Result Value Ref Range   Triglycerides 467 (H) <150 mg/dL    Comment: Performed at  [facility], [address] [place], [place] [address]  Procalcitonin     Status: None   Collection Time: 09/30/20  5:46 AM  Result Value Ref Range   Procalcitonin <0.10 ng/mL    Comment:        Interpretation: PCT (Procalcitonin) <= 0.5 ng/mL: Systemic infection (sepsis) is not likely. Local bacterial infection is possible. (NOTE)       Sepsis PCT Algorithm           Lower Respiratory Tract                                      Infection PCT Algorithm    ----------------------------     ----------------------------         PCT < 0.25 ng/mL                PCT < 0.10 ng/mL          Strongly encourage             Strongly discourage   discontinuation of antibiotics    initiation of antibiotics    ----------------------------     -----------------------------       PCT 0.25 - 0.50 ng/mL            PCT 0.10 - 0.25 ng/mL               OR       >80% decrease in PCT            Discourage initiation of                                            antibiotics      Encourage discontinuation           of antibiotics    ----------------------------     -----------------------------         PCT >= 0.50 ng/mL              PCT 0.26 - 0.50 ng/mL               AND        <80% decrease in PCT             Encourage initiation of  antibiotics       Encourage continuation           of antibiotics    ----------------------------     -----------------------------        PCT >= 0.50 ng/mL                  PCT > 0.50 ng/mL               AND         increase in PCT                  Strongly encourage                                      initiation of antibiotics    Strongly encourage escalation           of antibiotics                                     -----------------------------                                           PCT <= 0.25 ng/mL                                                 OR                                        > 80% decrease in  PCT                                      Discontinue / Do not initiate                                             antibiotics  Performed at [facility], [address] [place], [place] [address]   Lactic acid, plasma     Status: None   Collection Time: 09/30/20  5:46 AM  Result Value Ref Range   Lactic Acid, Venous 1.2 0.5 - 1.9 mmol/L    Comment: Performed at [facility], [place], [place] [address]  Acetaminophen level     Status: Abnormal   Collection Time: 09/30/20  5:46 AM  Result Value Ref Range   Acetaminophen (Tylenol), Serum <10 (L) 10 - 30 ug/mL    Comment: (NOTE) Therapeutic concentrations vary significantly. A range of 10-30 ug/mL  may be an effective concentration for many patients. However, some  are best treated at concentrations outside of this range. Acetaminophen concentrations >150 ug/mL at 4 hours after ingestion  and >50 ug/mL at 12 hours after ingestion are often associated with  toxic reactions.  Performed at [facility], [address] [place], [place] [address]  Glucose, capillary     Status: Abnormal   Collection Time: 09/30/20  7:42 AM  Result Value Ref Range   Glucose-Capillary 114 (H) 70 - 99 mg/dL    Comment: Glucose reference range applies only to samples taken after fasting for at least 8 hours.  Glucose, capillary     Status: Abnormal   Collection Time: 09/30/20 12:22 PM  Result Value Ref Range   Glucose-Capillary 104 (H) 70 - 99 mg/dL    Comment: Glucose reference range applies only to samples taken after fasting for at least 8 hours.    Current Facility-Administered Medications  Medication Dose Route Frequency Provider Last Rate Last Admin   0.9 %  sodium chloride infusion  250 mL Intravenous Continuous Rust-Chester, Britton L, NP       budesonide (PULMICORT) nebulizer solution 0.25 mg  0.25 mg Nebulization BID Rust-Chester, Britton L, NP   0.25 mg at 09/30/20 0759   cefTRIAXone  (ROCEPHIN) 1 g in sodium chloride 0.9 % 100 mL IVPB  1 g Intravenous Q12H Rust-Chester, Britton L, NP 200 mL/hr at 09/30/20 1100 1 g at 09/30/20 1100   docusate (COLACE) 50 MG/5ML liquid 100 mg  100 mg Per Tube BID Rust-Chester, Toribio Harbour L, NP       docusate sodium (COLACE) capsule 100 mg  100 mg Oral BID PRN Rust-Chester, Toribio Harbour L, NP       enoxaparin (LOVENOX) injection 72.5 mg  0.5 mg/kg Subcutaneous Q24H Rust-Chester, Britton L, NP   72.5 mg at 09/30/20 0054   insulin aspart (novoLOG) injection 0-15 Units  0-15 Units Subcutaneous Q4H Rust-Chester, Britton L, NP   2 Units at 09/30/20 0515   ipratropium-albuterol (DUONEB) 0.5-2.5 (3) MG/3ML nebulizer solution 3 mL  3 mL Nebulization Q6H PRN Rust-Chester, Britton L, NP       norepinephrine (LEVOPHED) '4mg'$  in 266m premix infusion  2-10 mcg/min Intravenous Titrated Rust-Chester, Britton L, NP   Stopped at 09/30/20 1004   ondansetron (ZOFRAN) 4 MG/2ML injection            pantoprazole (PROTONIX) injection 40 mg  40 mg Intravenous QHS Rust-Chester, Britton L, NP   40 mg at 09/29/20 2137   polyethylene glycol (MIRALAX / GLYCOLAX) packet 17 g  17 g Oral Daily PRN Rust-Chester, BToribio HarbourL, NP       polyethylene glycol (MIRALAX / GLYCOLAX) packet 17 g  17 g Per Tube Daily Rust-Chester, BHuel Cote NP        Musculoskeletal: Strength & Muscle Tone: decreased Gait & Station:  not assessed Patient leans: N/A            Psychiatric Specialty Exam:  Presentation  General Appearance:  Appropriate for Environment Eye Contact: Good Speech: Clear and Coherent Speech Volume: Normal Handedness: No data recorded  Mood and Affect  Mood: Depressed Affect: Appropriate; Congruent  Thought Process  Thought Processes: Coherent Descriptions of Associations:Intact Orientation:Full (Time, Place and Person) Thought Content:Logical History of Schizophrenia/Schizoaffective disorder:No data recorded Duration of Psychotic Symptoms:No data  recorded Hallucinations:Hallucinations: None Ideas of Reference:None Suicidal Thoughts:Suicidal Thoughts: No Homicidal Thoughts:Homicidal Thoughts: No  Sensorium  Memory: Immediate Good; Recent Good Judgment: Fair Insight: Fair  ECommunity education officer Concentration: Good Attention Span: Good Recall: Good Fund of Knowledge: Fair Language: Fair  Psychomotor Activity  Psychomotor Activity: Psychomotor Activity: Normal  Assets  Assets: Desire for Improvement; Financial Resources/Insurance  Sleep  Sleep: Sleep: Good  Physical Exam: Physical Exam Vitals and nursing note reviewed.  HENT:     Head: Normocephalic.  Nose: No congestion or rhinorrhea.  Eyes:     General:        Right eye: No discharge.        Left eye: No discharge.  Cardiovascular:     Rate and Rhythm: Normal rate.  Pulmonary:     Effort: Pulmonary effort is normal.  Musculoskeletal:     Cervical back: Normal range of motion.  Skin:    General: Skin is dry.  Neurological:     Mental Status: She is alert and oriented to person, place, and time.  Psychiatric:        Attention and Perception: Attention normal.        Mood and Affect: Mood is depressed.        Speech: Speech normal.        Behavior: Behavior is cooperative.        Thought Content: Thought content normal.        Cognition and Memory: Cognition normal.   Review of Systems  Psychiatric/Behavioral:  Positive for depression. Negative for hallucinations, memory loss, substance abuse and suicidal ideas (Denies at this time). The patient is not nervous/anxious and does not have insomnia.   Blood pressure 132/89, pulse 88, temperature 99.3 F (37.4 C), resp. rate 12, height '5\' 5"'$  (1.651 m), weight (!) 143.9 kg, SpO2 100 %. Body mass index is 52.79 kg/m.  Treatment Plan Summary: Daily contact with patient to assess and evaluate symptoms and progress in treatment. Will continue to follow patient on the medical unit.   Disposition:  Supportive therapy provided about ongoing stressors. Will continue to follow while on medical service. Writer spoke with  NP caring for patient on ICU, who states patient may move off ICU to medicine floor  Sherlon Handing, NP 09/30/2020 1:15 PM

## 2020-09-30 NOTE — Progress Notes (Signed)
Pt successfully extubated to nasal canula and tolerating well.  No s/sx of respiratory distress.  Currently alert and oriented to self/time/ situation.  He currently denies homicidal or suicidal ideations. Psychiatry consulted.  Pts sister Virgil Benedict updated per pts request and is en route to the hospital.  Will continue to monitor and assess pt.   Rosilyn Mings, AGNP  Pulmonary/Critical Care Pager [phone] (please enter 7 digits) PCCM Consult Pager [phone] (please enter 7 digits)

## 2020-09-30 NOTE — TOC Initial Note (Addendum)
Transition of Care [facility]) - Initial/Assessment Note    Patient Details  Name: [name] MRN: [id number] Date of Birth: 11-01-1965  [facility] CM/SW Contact:    Anselm Pancoast, RN Phone Number: 09/30/2020, 11:51 AM  Clinical Narrative:                 Met with patient and sister at bedside. Sitter present due to IVC. Sister states they were told he would be transferring to [facility] once medically cleared. Patient is agreeable and states he just wants to feel better. Reports that he lives alone and has significant history of depression.   Expected Discharge Plan: Psychiatric Hospital Barriers to Discharge: Continued Medical Work up   Patient Goals and CMS Choice Patient states their goals for this hospitalization and ongoing recovery are:: Feel better mentally      Expected Discharge Plan and Services Expected Discharge Plan: [facility]       Living arrangements for the past 2 months: Single Family Home                                      Prior Living Arrangements/Services Living arrangements for the past 2 months: Single Family Home Lives with:: Self   Do you feel safe going back to the place where you live?: Yes      Need for Family Participation in Patient Care: Yes (Comment) Care giver support system in place?: Yes (comment) Current home services: DME (walker) Criminal Activity/Legal Involvement Pertinent to Current Situation/Hospitalization: No - Comment as needed  Activities of Daily Living      Permission Sought/Granted                  Emotional Assessment Appearance:: Appears stated age Attitude/Demeanor/Rapport: Crying Affect (typically observed): Sad Orientation: : Oriented to Self, Oriented to Place, Oriented to  Time, Oriented to Situation Alcohol / Substance Use: Not Applicable Psych Involvement: Yes (comment)  Admission diagnosis:  Overdose [T50.901A] Intentional drug overdose, initial encounter ([place])  [T50.902A] Altered mental status, unspecified altered mental status type [R41.82] Patient Active Problem List   Diagnosis Date Noted   Overdose 09/30/2020   Suicidal ideation 09/30/2020   Acute on chronic respiratory failure with hypoxia ([place]) 09/30/2020   Endotracheally intubated 09/30/2020   On mechanically assisted ventilation ([place]) 09/30/2020   AKI (acute kidney injury) ([place]) 09/30/2020   Acute encephalopathy 09/30/2020   Lactic acidosis 09/30/2020   Depression 09/30/2020   Venous stasis dermatitis of both lower extremities 08/17/2019   Pain due to onychomycosis of toenails of both feet 05/15/2019   Chronic low back pain (1ry area of Pain) (Bilateral) w/o sciatica 03/09/2019   Bilateral lower leg cellulitis 06/27/2018   Osteoarthritis of facet joint of lumbar spine 02/14/2018   Osteoarthritis involving multiple joints 02/14/2018   Chronic musculoskeletal pain 02/14/2018   Neurogenic pain 02/14/2018   Abnormal MRI, lumbar spine 02/14/2018   History of allergy to shellfish 01/27/2018   Morbid obesity with BMI of 45.0-49.9, adult ([place]) 12/13/2017   Swelling of limb 11/26/2017   Lower limb ulcer, calf, left, limited to breakdown of skin ([place]) 11/26/2017   DM2 (diabetes mellitus, type 2) ([place]) 11/03/2017   Neurogenic bladder 11/03/2017   Spondylosis without myelopathy or radiculopathy, lumbar region 11/03/2017   Lumbar facet syndrome (Bilateral) (R>L) 11/03/2017   DDD (degenerative disc disease), lumbar 11/02/2017   Lumbar facet hypertrophy (Bilateral) 11/02/2017  Lumbar central spinal stenosis (L4-5) 11/02/2017   Lumbar lateral recess stenosis (L4-5) (Bilateral) (L>R) 11/02/2017   Vitamin D insufficiency 11/02/2017   Lumbar spondylosis 11/02/2017   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   Grief 10/18/2017   Chronic low back pain (Bilateral) (R>L) w/ sciatica (Bilateral) 10/07/2017   Chronic lower extremity pain (Secondary Area of Pain)  (Bilateral) (R>L) 10/07/2017   Chronic pain syndrome 10/07/2017   Pharmacologic therapy 10/07/2017   Disorder of skeletal system 10/07/2017   Problems influencing health status 10/07/2017   Barrett's esophagus without dysplasia 03/10/2017   Colon cancer screening 10/14/2016   Essential hypertension, benign 02/11/2016   Hyperlipidemia 02/11/2016   Tobacco dependence 02/11/2016   Obesity 02/11/2016   Lower limb ulcer, ankle, left, with fat layer exposed ([place]) 02/11/2016   Lymphedema 02/11/2016   Cellulitis 01/06/2016   PCP:  Theotis Burrow, MD Pharmacy:   [place], [place] - [address] [place] [address] Phone: [phone] Fax: [phone]  [facility] [address] [place] [address] Phone: [phone] Fax: [phone] [place], [place] [address] Phone: [phone] Fax: [phone]     Social Determinants of Health (SDOH) Interventions    Readmission Risk Interventions No flowsheet data found.

## 2020-09-30 NOTE — Progress Notes (Signed)
PT PREFERS SHE/HER PRONOUNS, AND TO BE CALLED "[name]."   Pt extubated this AM. Off all gtt's: Fent, Prop, and Levo.   Pt started on diet this afternoon. Eating adequately. Q4 BG's. No coverage needed.   Pt alert and oriented after extubation. Does not endorse any suicidal thoughts are thoughts of hurting self or others. Sitter remains @ bedside to due IVC status.   Sister @ bedside this afternoon. Pt pleasant and approachable.

## 2020-09-30 NOTE — Consult Note (Signed)
PHARMACY CONSULT NOTE  Pharmacy Consult for Electrolyte Monitoring and Replacement   Recent Labs: Potassium (mmol/L)  Date Value  09/30/2020 4.4   Magnesium (mg/dL)  Date Value  09/30/2020 1.8   Calcium (mg/dL)  Date Value  09/30/2020 8.2 (L)   Albumin (g/dL)  Date Value  09/30/2020 3.2 (L)  10/07/2017 4.1   Phosphorus (mg/dL)  Date Value  09/30/2020 4.3   Sodium (mmol/L)  Date Value  09/30/2020 134 (L)  10/07/2017 140   Assessment: Patient is a 55 y/o with medical history including COPD, history of tobacco abuse, CAD, HTN, HLD, OSA, diabetes, depression / anxiety who is admitted for drug overdose. Patient was briefly intubated 9/18 - 9/19. Pharmacy consulted to assist with electrolyte monitoring and replacement as indicated.  Goal of Therapy:  Electrolytes within normal limits  Plan:  --Mg 1.8, IV magnesium sulfate 2 g x 1 --No further electrolyte replacement warranted at this time --Will follow-up electrolytes with AM labs tomorrow  Benita Gutter 09/30/2020 1:44 PM

## 2020-09-30 NOTE — Progress Notes (Signed)
Chaplain Maggie made introductory visit at bedside with pt and his sister. Room was made for storytelling and empathetic listening. Pt shared spiritual experience of coming to understand he has a purpose for his life. Chaplain encouraged pt to engage in self care and being receptive to family and community support. Pt spoke of history of loss and grief. Visitation was appreciated. Chaplain expects to follow up.

## 2020-09-30 NOTE — Progress Notes (Signed)
NAME:  [name], MRN:  [id number], DOB:  01-27-1965, LOS: 1 ADMISSION DATE:  09/29/2020, CONSULTATION DATE:  09/29/2020 REFERRING MD:  Dr. Quentin Cornwall, CHIEF COMPLAINT:   AMS, unresponsive  History of Present Illness:  55 year old male [facility]/2020 via EMS from home after a friend of the family found him unresponsive on his floor for an unknown amount of time.  This friend performed a wellness check on the patient after seeing a post on social media where the patient made comments alluding to suicide.  Per the patient's sister who is bedside, his nephew was murdered this year and very recently a friend of the patient who had been staying with him committed suicide in the patient's bathroom.  His sister reported that after his nephew's death the patient was committed to a mental health facility for time and at baseline has been struggling with depression, anxiety and agoraphobia.  The friend who found the patient reported to EMS that he was found down next to 2 empty pill bottles: losartan and Zanaflex, for which he has prescriptions. Per ED documentation, EMS gave IV fluids for hypotension and calcium in route.  ED course: Patient was emergently intubated requiring mechanical ventilation on arrival with a GCS of 6.  Initial vitals: Afebrile at 98.1, mildly tachypneic at 21, NSR at 84, soft BP 97/66 & SPO2 100% on 70% FiO2. Significant labs: Labs/ Imaging personally reviewed I, Domingo Pulse [place], AGACNP-BC, personally viewed and interpreted this ECG. EKG Interpretation Date: 09/29/2020 EKG Time: 1908 Rate: 108 Rhythm: Sinus tachycardia QRS Axis: Normal Intervals: QTC prolongation at 498 ST/T Wave abnormalities: None Narrative Interpretation: Sinus tachycardia with QTC prolongation Na+/ K+: 135/3.8, AKI - BUN/Cr.:  18/1.31, Serum CO2/ AG: 23/6 Hgb: 12.1, WBC: 9.7, lactic acidosis-2.5, PCT negative, COVID-19 & influenza A/B: Negative, Tylenol and salicylate levels  both negative ABG: 7.35/37/200/20.4 (post intubation) CXR 09/29/2020: Blunting of the costophrenic angle suggests small bilateral effusions CT head without contrast 09/29/2020: Negative for intracranial abnormality  PCCM consulted for admission Pertinent  Medical History  COPD Former smoker: 1-1/2 packs a day for 43 years (quit August 2021) CAD HTN HLD OSA T2DM Depression/ anxiety  Significant Hospital Events: Including procedures, antibiotic start and stop dates in addition to other pertinent events   09/29/20: Patient emergently intubated requiring mechanical ventilation after intentional overdose of Zanaflex & losartan 09/30/20: Pt now able to follow commands performing SBT; if pt tolerates will extubate   Interim History / Subjective:  Pt awake and following commands currently tolerating SBT 5/5  Objective   Blood pressure 109/72, pulse 71, temperature 97.7 F (36.5 C), resp. rate 15, height '5\' 5"'$  (1.651 m), weight (!) 143.9 kg, SpO2 95 %.    Vent Mode: PRVC FiO2 (%):  [24 %-70 %] 24 % Set Rate:  [20 bmp] 20 bmp Vt Set:  [500 mL] 500 mL PEEP:  [5 cmH20] 5 cmH20   Intake/Output Summary (Last 24 hours) at 09/30/2020 0817 Last data filed at 09/30/2020 0659 Gross per 24 hour  Intake 1101.36 ml  Output 425 ml  Net 676.36 ml   Filed Weights   09/29/20 1938 09/30/20 0000  Weight: (!) 145.2 kg (!) 143.9 kg    Examination: General: Well developed, well nourished male, NAD mechanically intubated  HEENT: MM pink/moist, anicteric, atraumatic, neck supple Neuro: Sedated, following commands, PERRLA  CV: NSR, S1S2, no M/R/G, 2+ radial/2+ distal pulses Pulm: Clear throughout, even, non labored  GI: +BS x4, obese, soft, non tender,  non distended  GU: Foley in place with cloudy yellow urine Skin: limited exam- chronic bilateral lower extremity vascular changes, appearance of PVD (no open areas) no rashes/lesions noted Extremities: warm/dry, pulses + 2 R/P, trace edema noted  BLE  Resolved Hospital Problem list     Assessment & Plan:  Acute Hypoxic Respiratory Failure secondary to acute encephalopathy in the setting of intentional drug overdose   PMHx: COPD, former smoker, OSA - Ventilator settings: PRVC  8 mL/kg, 70% FiO2,  PEEP, continue ventilator support & lung protective strategies - Wean PEEP & FiO2 as tolerated, maintain SpO2 > 90% - Head of bed elevated 30 degrees, VAP protocol in place - Plateau pressures less than 30 cm H20  - Intermittent chest x-ray & ABG PRN - Daily WUA with SBT as tolerated  - Ensure adequate pulmonary hygiene  - Budesonide nebs BID, bronchodilators PRN - PAD protocol in place: continue Fentanyl drip & Propofol drip  Intentional Drug Overdose in the setting of suicidal ideation Acute Encephalopathy~improving  PMHx: anxiety, depression, agoraphobia (per sister) CTH negative - Poison Control alerted to case, recommendations: continue IVF resuscitation, monitor Qtc, monitor for hypotension/ bradycardia, if the patient's prescribed Wellbutrin was taken incorrectly might see increased agitation/seizures. They agreed with choice of sedatives & overall plan of care - f/u Ekg Q 4, if > 500 replace K+/Mg, goals > 4.2/ 2.1 respectively - IVC in place from ED, consult psychiatry once patient is medically stabilize and will need sitter at bedside once extubated for suicide precautions  - Outpatient medications on hold: Wellbutrin, oxybutynin, Lexapro, Abilify, gabapentin, Zanaflex  Lactic acidosis Suspected Aspiration Initial PCT negative, no leukocytosis but concern for aspiration d/t unknown amount of time unresponsive at home. - F/u cultures, trend PCT - Continue CAP/Aspiration Pna coverage: Ceftriaxone - Continuous cardiac monitoring - F/u lactic, daily CBC- monitor WBC/fever curve - Maintain map >65  Acute Kidney Injury in the setting of Intentional Overdose  Baseline Cr: 1.2, Cr on admission:1.31 - Strict I/O's: alert  provider if UOP < 0.5 mL/kg/hr - Gentle IVF hydration  - Daily BMP, replace electrolytes PRN - Avoid nephrotoxic agents as able, ensure adequate renal perfusion  Type 2 Diabetes Mellitus Hemoglobin A1C: pending - Monitor CBG Q 4 hours - SSI moderate dosing - target range while in ICU: 140-180 - follow ICU hyper/hypo-glycemia protocol  CAD PMHx: HLD, HTN - Continuous telemetry monitoring  - Hold outpatient antihypertensives due to soft bp - Will resume outpatient atorvastatin once able to tolerate po's   Best Practice (right click and "Reselect all SmartList Selections" daily)  Diet/type: NPO w/ meds via tube DVT prophylaxis: LMWH GI prophylaxis: PPI Lines: N/A Foley:  Yes, and it is still needed Code Status:  full code Last date of multidisciplinary goals of care discussion [09/30/20]  Labs   CBC: Recent Labs  Lab 09/29/20 1914 09/30/20 0546  WBC 9.7 13.9*  NEUTROABS 7.5  --   HGB 12.1* 12.6*  HCT 37.1* 38.5*  MCV 86.9 89.3  PLT 226 [id number]    Basic Metabolic Panel: Recent Labs  Lab 09/29/20 1914 09/30/20 0546  NA 135 134*  K 3.8 4.4  CL 106 108  CO2 23 19*  GLUCOSE 175* 122*  BUN 18 22*  CREATININE 1.31* 2.10*  CALCIUM 8.9 8.2*  MG 1.9 1.8  PHOS 2.8 4.3   GFR: Estimated Creatinine Clearance (by C-G formula based on SCr of 2.1 mg/dL (H)) Male: 43.9 mL/min (A) Male: 53.1 mL/min (A) Recent Labs  Lab 09/29/20  1914 09/29/20 2200 09/30/20 0546  PROCALCITON <0.10  --  <0.10  WBC 9.7  --  13.9*  LATICACIDVEN 2.5* 2.1* 1.2    Liver Function Tests: Recent Labs  Lab 09/29/20 1914 09/30/20 0546  AST 22 18  ALT 14 14  ALKPHOS 50 48  BILITOT 0.7 0.6  PROT 5.8* 5.8*  ALBUMIN 3.1* 3.2*   No results for input(s): LIPASE, AMYLASE in the last 168 hours. No results for input(s): AMMONIA in the last 168 hours.  ABG    Component Value Date/Time   PHART 7.31 (L) 09/30/2020 0508   PCO2ART 39 09/30/2020 0508   PO2ART 103 09/30/2020 0508   HCO3 19.6 (L)  09/30/2020 0508   ACIDBASEDEF 6.2 (H) 09/30/2020 0508   O2SAT 97.4 09/30/2020 0508     Coagulation Profile: Recent Labs  Lab 09/29/20 1914  INR 1.1    Cardiac Enzymes: No results for input(s): CKTOTAL, CKMB, CKMBINDEX, TROPONINI in the last 168 hours.  HbA1C: Hgb A1c MFr Bld  Date/Time Value Ref Range Status  06/27/2018 12:39 PM 6.1 (H) 4.8 - 5.6 % Final    Comment:    (NOTE) Pre diabetes:          5.7%-6.4% Diabetes:              >6.4% Glycemic control for   <7.0% adults with diabetes     CBG: Recent Labs  Lab 09/30/20 0512 09/30/20 0742  GLUCAP 128* 114*    Review of Systems:   UTA- patient intubated & sedated, unable to interview at this time  Past Medical History:  She,  has a past medical history of COPD (chronic obstructive pulmonary disease) ([place]), Coronary artery disease, Diabetes mellitus without complication ([place]), Hyperlipidemia, Hypertension, Peptic ulcer, and Sleep apnea.   Surgical History:   Past Surgical History:  Procedure Laterality Date   COLONOSCOPY WITH PROPOFOL N/A 11/09/2016   Procedure: COLONOSCOPY WITH PROPOFOL;  Surgeon: Lollie Sails, MD;  Location[facility] ENDOSCOPY;  Service: Endoscopy;  Laterality: N/A;   COLONOSCOPY WITH PROPOFOL N/A 07/06/2017   Procedure: COLONOSCOPY WITH PROPOFOL;  Surgeon: Lollie Sails, MD;  Location: [facility] ENDOSCOPY;  Service: Endoscopy;  Laterality: N/A;   ESOPHAGOGASTRODUODENOSCOPY (EGD) WITH PROPOFOL N/A 11/09/2016   Procedure: ESOPHAGOGASTRODUODENOSCOPY (EGD) WITH PROPOFOL;  Surgeon: Lollie Sails, MD;  Location: [facility] ENDOSCOPY;  Service: Endoscopy;  Laterality: N/A;   ESOPHAGOGASTRODUODENOSCOPY (EGD) WITH PROPOFOL N/A 07/06/2017   Procedure: ESOPHAGOGASTRODUODENOSCOPY (EGD) WITH PROPOFOL;  Surgeon: Lollie Sails, MD;  Location: [facility] ENDOSCOPY;  Service: Endoscopy;  Laterality: N/A;     Social History:   reports that she quit smoking about 13 months ago. Her smoking use included  cigarettes. She has a 22.50 pack-year smoking history. She has never used smokeless tobacco. She reports that she does not drink alcohol and does not use drugs.   Family History:  Her family history includes Alcohol abuse in her father; Diabetes in her father and mother; Heart attack in her brother, father, paternal aunt, paternal uncle, and sister; Stomach cancer in her father.   Allergies Allergies  Allergen Reactions   Shellfish Allergy Swelling   Codeine Rash   Lisinopril Cough     Home Medications  Prior to Admission medications   Medication Sig Start Date End Date Taking? Authorizing Provider  albuterol (VENTOLIN HFA) 108 (90 Base) MCG/ACT inhaler Inhale 2 puffs into the lungs every 4 (four) hours as needed for wheezing or shortness of breath.     [provider]  ARIPiprazole (  ABILIFY) 2 MG tablet Take 1 tablet (2 mg total) by mouth daily. 09/19/20 11/18/20  Norman Clay, MD  aspirin-acetaminophen-caffeine (EXCEDRIN MIGRAINE) [phone] MG tablet Take by mouth every 6 (six) hours as needed for headache.    [provider]  atorvastatin (LIPITOR) 20 MG tablet Take 20 mg by mouth at bedtime.     [provider]  buPROPion (WELLBUTRIN XL) 300 MG 24 hr tablet Take 1 tablet (300 mg total) by mouth daily. 08/26/20 11/24/20  Norman Clay, MD  Calcium-Magnesium-Vitamin D (CALCIUM 1200+D3 PO) Take by mouth.    [provider]  Cholecalciferol (VITAMIN D) 125 MCG (5000 UT) CAPS Take by mouth.    [provider]  clindamycin (CLEOCIN T) 1 % external solution APPLY TO AFFECTED AREA RASH ON BUTTOCKS1 TIME A WEEK ON SUNDAYS 07/16/20   Ralene Bathe, MD  doxycycline (VIBRA-TABS) 100 MG tablet Take 100 mg by mouth 2 (two) times daily. 07/22/20   [provider]  escitalopram (LEXAPRO) 20 MG tablet Take by mouth.  10/06/19 01/04/20  [provider]  estradiol (ESTRACE) 2 MG tablet Take by mouth. 03/13/20   [provider]   fluconazole (DIFLUCAN) 200 MG tablet Take 1 tablet (200 mg total) by mouth 3 (three) times a week. 05/31/20   Ralene Bathe, MD  gabapentin (NEURONTIN) 300 MG capsule Take 300 mg by mouth 3 (three) times daily.  09/20/18   [provider]  hydrochlorothiazide (MICROZIDE) 12.5 MG capsule Take 12.5 mg by mouth daily.     [provider]  hydrocortisone 2.5 % lotion APPLY TO AFFECTEDAREA RASH ON BUTTOCKS 3 DAYS PER WEEK. TUESDAY, THURSDAY, SATURDAY, AND AS NEEDED FOR FLARES 07/16/20   Ralene Bathe, MD  Ipratropium-Albuterol (COMBIVENT) 20-100 MCG/ACT AERS respimat Inhale into the lungs. 05/27/20 05/27/21  [provider]  ketoconazole (NIZORAL) 2 % cream Apply to a rash on buttock 3 days a week. On Monday, Wednesday and Friday 04/19/20   [provider]  loratadine (CLARITIN) 10 MG tablet Take 10 mg by mouth daily.    [provider]  losartan (COZAAR) 100 MG tablet Take 1 tablet by mouth daily. 05/13/20   [provider]  losartan (COZAAR) 50 MG tablet Take 1 tablet (50 mg total) by mouth daily. 01/09/16   Loletha Grayer, MD  Magnesium 500 MG CAPS Take 1 capsule (500 mg total) by mouth at bedtime. 11/15/19 02/13/20  Milinda Pointer, MD  meloxicam (MOBIC) 15 MG tablet Take 1 tablet (15 mg total) by mouth daily. 11/15/19 02/13/20  Milinda Pointer, MD  metFORMIN (GLUCOPHAGE) 1000 MG tablet Take 1,000 mg by mouth 2 (two) times daily with a meal.    [provider]  oxybutynin (DITROPAN) 5 MG tablet Take 5 mg by mouth every 12 (twelve) hours.    [provider]  OZEMPIC, 0.25 OR 0.5 MG/DOSE, 2 MG/1.5ML SOPN Inject 0.5 mg into the skin once a week. 04/15/20   [provider]  pantoprazole (PROTONIX) 40 MG tablet Take 40 mg by mouth 2 (two) times daily.     [provider]  phentermine 37.5 MG capsule Take 37.5 mg by mouth every morning.    [provider]  Potassium Gluconate 595 MG CAPS Take by mouth.     [provider]  simethicone (MYLICON) 0000000 MG chewable tablet Chew 125 mg by mouth every 6 (six) hours as needed for flatulence.    [provider]  spironolactone (ALDACTONE) 50 MG tablet Take 50 mg  by mouth 2 (two) times daily. 03/13/20   [provider]  tiZANidine (ZANAFLEX) 4 MG tablet Take 1 tablet (4 mg total) by mouth every 8 (eight) hours as needed for muscle spasms. 11/15/19 02/13/20  Milinda Pointer, MD  triamcinolone ointment (KENALOG) 0.1 % APPLY AT BEDTIME EVERYDAY TO FEET AND LOWER LEGS. AVOID FACE,GROIN, AND AXILLA 07/16/20   Ralene Bathe, MD  TRUEPLUS 5-BEVEL PEN NEEDLES 31G X 6 MM [place]  12/28/18   [provider]  umeclidinium-vilanterol (ANORO ELLIPTA) 62.5-25 MCG/INH AEPB Inhale into the lungs. 10/06/19   [provider]  Urea 35 % CREA Apply 1 application topically as directed. Qod to lower legs 05/30/20   Ralene Bathe, MD  Urea 40 % LOTN Apply 1 application topically every other day. 04/24/20   Ralene Bathe, MD  VICTOZA 18 MG/3ML SOPN daily.  12/28/18   [provider]  vitamin E 180 MG (400 UNITS) capsule Take 400 Units by mouth daily.    [provider]     Critical care time: 35 minutes    Rosilyn Mings, [place] Pager [phone] (please enter 7 digits) PCCM Consult Pager [phone] (please enter 7 digits)

## 2020-10-01 ENCOUNTER — Encounter: Payer: Self-pay | Admitting: Internal Medicine

## 2020-10-01 ENCOUNTER — Other Ambulatory Visit: Payer: Self-pay

## 2020-10-01 DIAGNOSIS — N179 Acute kidney failure, unspecified: Secondary | ICD-10-CM

## 2020-10-01 DIAGNOSIS — G9340 Encephalopathy, unspecified: Secondary | ICD-10-CM

## 2020-10-01 DIAGNOSIS — T50902D Poisoning by unspecified drugs, medicaments and biological substances, intentional self-harm, subsequent encounter: Secondary | ICD-10-CM | POA: Diagnosis not present

## 2020-10-01 DIAGNOSIS — R45851 Suicidal ideations: Secondary | ICD-10-CM

## 2020-10-01 LAB — BASIC METABOLIC PANEL
Anion gap: 4 — ABNORMAL LOW (ref 5–15)
BUN: 29 mg/dL — ABNORMAL HIGH (ref 6–20)
CO2: 23 mmol/L (ref 22–32)
Calcium: 8.5 mg/dL — ABNORMAL LOW (ref 8.9–10.3)
Chloride: 111 mmol/L (ref 98–111)
Creatinine, Ser: 3.83 mg/dL — ABNORMAL HIGH (ref 0.61–1.24)
GFR, Estimated: 18 mL/min — ABNORMAL LOW (ref >60)
Glucose, Bld: 112 mg/dL — ABNORMAL HIGH (ref 70–99)
Potassium: 4.5 mmol/L (ref 3.5–5.1)
Sodium: 138 mmol/L (ref 135–145)

## 2020-10-01 LAB — CBC
HCT: 38.1 % — ABNORMAL LOW (ref 39.0–52.0)
Hemoglobin: 12.1 g/dL — ABNORMAL LOW (ref 13.0–17.0)
MCH: 28.1 pg (ref 26.0–34.0)
MCHC: 31.8 g/dL (ref 30.0–36.0)
MCV: 88.6 fL (ref 80.0–100.0)
Platelets: 218 10*3/uL (ref 150–400)
RBC: 4.3 MIL/uL (ref 4.22–5.81)
RDW: 13.4 % (ref 11.5–15.5)
WBC: 7.9 10*3/uL (ref 4.0–10.5)
nRBC: 0 % (ref 0.0–0.2)

## 2020-10-01 LAB — MAGNESIUM: Magnesium: 2.5 mg/dL — ABNORMAL HIGH (ref 1.7–2.4)

## 2020-10-01 LAB — GLUCOSE, CAPILLARY
Glucose-Capillary: 114 mg/dL — ABNORMAL HIGH (ref 70–99)
Glucose-Capillary: 92 mg/dL (ref 70–99)
Glucose-Capillary: 114 mg/dL — ABNORMAL HIGH (ref 70–99)
Glucose-Capillary: 128 mg/dL — ABNORMAL HIGH (ref 70–99)

## 2020-10-01 LAB — PROCALCITONIN: Procalcitonin: 0.17 ng/mL

## 2020-10-01 LAB — PHOSPHORUS: Phosphorus: 4.4 mg/dL (ref 2.5–4.6)

## 2020-10-01 MED ORDER — HEPARIN SODIUM (PORCINE) 10000 UNIT/ML IJ SOLN
7500.0000 [IU] | Freq: Three times a day (TID) | INTRAMUSCULAR | Status: DC
  Administered 2020-10-01 – 2020-10-03 (×5): 7500 [IU] via SUBCUTANEOUS
  Filled 2020-10-01 (×7): qty 1

## 2020-10-01 MED ORDER — INFLUENZA VAC SPLIT QUAD 0.5 ML IM SUSY
0.5000 mL | PREFILLED_SYRINGE | INTRAMUSCULAR | Status: AC
  Administered 2020-10-02: 0.5 mL via INTRAMUSCULAR
  Filled 2020-10-01 (×2): qty 0.5

## 2020-10-01 MED ORDER — PNEUMOCOCCAL VAC POLYVALENT 25 MCG/0.5ML IJ INJ
0.5000 mL | INJECTION | INTRAMUSCULAR | Status: AC
  Administered 2020-10-02: 09:00:00 0.5 mL via INTRAMUSCULAR
  Filled 2020-10-01: qty 0.5

## 2020-10-01 MED ORDER — LACTATED RINGERS IV SOLN: INTRAVENOUS | Status: DC

## 2020-10-01 MED ORDER — HEPARIN SODIUM (PORCINE) 5000 UNIT/ML IJ SOLN: 5000.0000 [IU] | Freq: Three times a day (TID) | INTRAMUSCULAR | Status: DC

## 2020-10-01 NOTE — Progress Notes (Signed)
Progress Note    RAMONA SLINGER  FUX:[id number] DOB: 12-Oct-1965  DOA: 09/29/2020 PCP: Theotis Burrow, MD      Brief Narrative:    Medical records reviewed and are as summarized below:  BURLIE CAJAMARCA is a 55 y.o. adult with past medical history significant for COPD, CAD, type II DM, hypertension, hyperlipidemia, obstructive sleep apnea, peptic ulcer disease.  He was brought to the hospital because of unresponsiveness.  He was found on the floor for an unknown amount of time.  2 empty bottles of losartan and Zanaflex were found next to him on the floor.  Reportedly, his friend performed a wellness check on the patient after seeing a post on social media where the patient had made comments alluding to suicide.  Patient's nephew was murdered this year and recently a friend of the patient who had been staying with him committed suicide and the patient was positive.  Since then, patient has been struggling with depression, anxiety and agoraphobia.  He was admitted to the hospital for intentional drug overdose, suicide attempt, acute toxic encephalopathy, AKI and acute hypoxemic respiratory failure.  He was intubated and placed on mechanical ventilation for airway protection and acute respiratory failure.  He was given IV fluids for hydration.  He was also found to have lactic acidosis and suspected aspiration pneumonia.  He was treated with empiric IV antibiotics.    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   (Morbid obesity)   Acute hypoxemic respiratory failure: S/p extubation on 09/30/2020.  He is on 4 L/min oxygen via nasal cannula.  Taper off oxygen as able.  Intentional drug overdose/suicide attempt, depression, anxiety, agoraphobia: Patient has been evaluated by the psychiatrist.  He is under IVC.  Follow-up with psychiatrist for further recommendations  AKI: Creatinine has worsened from 2.1-3.8.  Restart IV fluids and monitor BMP.  Probable aspiration pneumonia: Continue IV Rocephin  COPD: Compensated.  Continue bronchodilators  Acute toxic encephalopathy: Resolved  Hypertension: Losartan on hold  Type II DM: NovoLog as needed for hyperglycemia   Diet Order             Diet Carb Modified Fluid consistency: Thin; Room service appropriate? Yes  Diet effective now                      Consultants: Intensivist Psychiatrist  Procedures: Intubation and mechanical ventilation on 09/29/2020    Medications:    atorvastatin  20 mg Oral QHS   docusate sodium  100 mg Oral BID   heparin injection (subcutaneous)  7,500 Units Subcutaneous Q8H   [START ON 10/02/2020] influenza vac split quadrivalent PF  0.5 mL Intramuscular Tomorrow-1000   insulin aspart  0-15 Units Subcutaneous TID AC & HS   loratadine  10 mg Oral Daily   pantoprazole (PROTONIX) IV  40 mg Intravenous QHS   [START ON 10/02/2020] pneumococcal 23 valent vaccine  0.5 mL Intramuscular Tomorrow-1000   umeclidinium-vilanterol  1 puff Inhalation Daily  Continuous Infusions:  sodium chloride     cefTRIAXone (ROCEPHIN)  IV 1 g (10/01/20 0946)   lactated ringers 125 mL/hr at 10/01/20 8119     Anti-infectives (From admission, onward)    Start     Dose/Rate Route Frequency Ordered Stop   09/30/20 0215  cefTRIAXone (ROCEPHIN) 1 g in sodium chloride 0.9 % 100 mL IVPB        1 g 200 mL/hr over 30 Minutes Intravenous Every 12 hours 09/30/20 0118     09/29/20 2345  Ampicillin-Sulbactam (UNASYN) 3 g in sodium  chloride 0.9 % 100 mL IVPB  Status:  Discontinued        3 g 200 mL/hr over 30 Minutes Intravenous Every 6 hours 09/29/20 2334 09/30/20 0138              Family Communication/Anticipated D/C date and plan/Code Status   DVT prophylaxis: SCDs Start: 09/29/20 2100     Code Status: Full Code  Family Communication: None Disposition Plan:    Status is: Inpatient  Remains inpatient appropriate because:Inpatient level of care appropriate due to severity of illness  Dispo: The patient is from: Home              Anticipated d/c is to: Home              Patient currently is not medically stable to d/c.   Difficult to place patient No           Subjective:   Interval events noted.  No shortness of breath or chest pain.  Sitter was at the bedside.  Objective:    Vitals:   10/01/20 0040 10/01/20 0343 10/01/20 0749 10/01/20 1302  BP: 129/62 (!) 154/79 (!) 158/81 (!) 159/94  Pulse: (!) 101 96 96 (!) 102  Resp: 17 15 17 17   Temp:  99.4 F (37.4 C) 98.6 F (37 C) 98.2 F (36.8 C)  TempSrc:  Oral Oral Oral  SpO2:  99% 100% 100%  Weight:      Height:       No data found.   Intake/Output Summary (Last 24 hours) at 10/01/2020 1343 Last data filed at 10/01/2020 1313 Gross per 24 hour  Intake 200 ml  Output 2500 ml  Net -2300 ml   Filed Weights   09/29/20 1938 09/30/20 0000  Weight: (!) 145.2 kg (!) 143.9 kg    Exam:  GEN: NAD SKIN: Chronic erythematous changes on bilateral legs EYES: EOMI ENT: MMM CV: RRR PULM: CTA B ABD: soft, obese, NT, +BS CNS: AAO x 3, non focal EXT: No edema or tenderness    Pressure Injury 09/29/20 Coccyx Mid Deep Tissue Pressure Injury - Purple or maroon localized area of discolored intact skin or blood-filled blister due to damage of underlying soft tissue from pressure and/or shear. Purple,painful and red due to damage o (Active)  09/29/20 2350  Location: Coccyx  Location Orientation: Mid  Staging: Deep Tissue Pressure  Injury - Purple or maroon localized area of discolored intact skin or blood-filled blister due to damage of underlying soft tissue from pressure and/or shear.  Wound Description (Comments): Purple,painful and red due to damage of underlying soft tissue  Present on Admission: Yes     Data Reviewed:   I have personally reviewed following labs and imaging studies:  Labs: Labs show the following:   Basic Metabolic Panel: Recent Labs  Lab 09/29/20 1914 09/30/20 0546 10/01/20 0416  NA 135 134* 138  K 3.8 4.4 4.5  CL 106 108 111  CO2 23 19* 23  GLUCOSE 175* 122* 112*  BUN 18 22* 29*  CREATININE 1.31* 2.10* 3.83*  CALCIUM 8.9 8.2* 8.5*  MG 1.9 1.8 2.5*  PHOS 2.8 4.3 4.4   GFR Estimated Creatinine Clearance (by C-G formula based on SCr of 3.83 mg/dL (H)) Male: 24.1 mL/min (A) Male: 29.1 mL/min (A) Liver Function Tests: Recent Labs  Lab 09/29/20 1914 09/30/20 0546  AST 22 18  ALT 14 14  ALKPHOS 50 48  BILITOT 0.7 0.6  PROT 5.8* 5.8*  ALBUMIN 3.1* 3.2*   No results for input(s): LIPASE, AMYLASE in the last 168 hours. No results for input(s): AMMONIA in the last 168 hours. Coagulation profile Recent Labs  Lab 09/29/20 1914  INR 1.1    CBC: Recent Labs  Lab 09/29/20 1914 09/30/20 0546 10/01/20 0416  WBC 9.7 13.9* 7.9  NEUTROABS 7.5  --   --   HGB 12.1* 12.6* 12.1*  HCT 37.1* 38.5* 38.1*  MCV 86.9 89.3 88.6  PLT 226 311 218   Cardiac Enzymes: No results for input(s): CKTOTAL, CKMB, CKMBINDEX, TROPONINI in the last 168 hours. BNP (last 3 results) No results for input(s): PROBNP in the last 8760 hours. CBG: Recent Labs  Lab 09/30/20 1522 09/30/20 1941 09/30/20 2211 10/01/20 0753 10/01/20 1114  GLUCAP 96 112* 111* 114* 92   D-Dimer: No results for input(s): DDIMER in the last 72 hours. Hgb A1c: Recent Labs    09/30/20 0546  HGBA1C 5.4   Lipid Profile: Recent Labs    09/30/20 0546  TRIG 467*   Thyroid function studies: No results for  input(s): TSH, T4TOTAL, T3FREE, THYROIDAB in the last 72 hours.  Invalid input(s): FREET3 Anemia work up: No results for input(s): VITAMINB12, FOLATE, FERRITIN, TIBC, IRON, RETICCTPCT in the last 72 hours. Sepsis Labs: Recent Labs  Lab 09/29/20 1914 09/29/20 2200 09/30/20 0546 10/01/20 0416  PROCALCITON <0.10  --  <0.10 0.17  WBC 9.7  --  13.9* 7.9  LATICACIDVEN 2.5* 2.1* 1.2  --     Microbiology Recent Results (from the past 240 hour(s))  Blood culture (routine single)     Status: None (Preliminary result)   Collection Time: 09/29/20  7:14 PM   Specimen: BLOOD  Result Value Ref Range Status   Specimen Description BLOOD BLOOD LEFT FOREARM  Final   Special Requests   Final    BOTTLES DRAWN AEROBIC AND ANAEROBIC Blood Culture adequate volume   Culture   Final    NO GROWTH 2 DAYS Performed at [facility], [address], [place], [place] [address]    Report Status PENDING  Incomplete  Resp Panel by RT-PCR (Flu A&B, Covid)     Status: None   Collection Time: 09/29/20  7:14 PM   Specimen: Nasopharyngeal(NP) swabs in vial transport medium  Result Value Ref Range Status   SARS Coronavirus 2 by RT PCR NEGATIVE NEGATIVE Final    Comment: (NOTE) SARS-CoV-2 target nucleic acids are NOT DETECTED.  The SARS-CoV-2 RNA is generally detectable in upper respiratory specimens during the acute phase of infection. The lowest concentration of SARS-CoV-2 viral copies this assay can detect is 138 copies/mL. A negative result does not preclude SARS-Cov-2 infection and should not be used as the sole basis for treatment or other patient management decisions. A negative result may occur with  improper specimen collection/handling, submission of specimen other than nasopharyngeal swab, presence of viral mutation(s) within the areas targeted by this assay, and inadequate  number of viral copies(<138 copies/mL). A negative result must be combined with clinical observations, patient  history, and epidemiological information. The expected result is Negative.  Fact Sheet for Patients:  EntrepreneurPulse.com.au  Fact Sheet for Healthcare Providers:  IncredibleEmployment.be  This test is no t yet approved or cleared by the [place] FDA and  has been authorized for detection and/or diagnosis of SARS-CoV-2 by FDA under an Emergency Use Authorization (EUA). This EUA will remain  in effect (meaning this test can be used) for the duration of the COVID-19 declaration under Section 564(b)(1) of the Act, 21 U.S.C.section 360bbb-3(b)(1), unless the authorization is terminated  or revoked sooner.       Influenza A by PCR NEGATIVE NEGATIVE Final   Influenza B by PCR NEGATIVE NEGATIVE Final    Comment: (NOTE) The Xpert Xpress SARS-CoV-2/FLU/RSV plus assay is intended as an aid in the diagnosis of influenza from Nasopharyngeal swab specimens and should not be used as a sole basis for treatment. Nasal washings and aspirates are unacceptable for Xpert Xpress SARS-CoV-2/FLU/RSV testing.  Fact Sheet for Patients: EntrepreneurPulse.com.au  Fact Sheet for Healthcare Providers: IncredibleEmployment.be  This test is not yet approved or cleared by the [place] FDA and has been authorized for detection and/or diagnosis of SARS-CoV-2 by FDA under an Emergency Use Authorization (EUA). This EUA will remain in effect (meaning this test can be used) for the duration of the COVID-19 declaration under Section 564(b)(1) of the Act, 21 U.S.C. section 360bbb-3(b)(1), unless the authorization is terminated or revoked.  Performed at [facility], [address] [place], [place] [address]   MRSA Next Gen by PCR, Nasal     Status: None   Collection Time: 09/30/20 12:10 AM   Specimen: Nasal Mucosa; Nasal Swab  Result Value Ref Range Status   MRSA by PCR Next Gen NOT DETECTED NOT DETECTED Final     Comment: (NOTE) The GeneXpert MRSA Assay (FDA approved for NASAL specimens only), is one component of a comprehensive MRSA colonization surveillance program. It is not intended to diagnose MRSA infection nor to guide or monitor treatment for MRSA infections. Test performance is not FDA approved in patients less than 93 years old. Performed at [facility], [address] [place], [place] [address]     Procedures and diagnostic studies:  DG Abdomen 1 View  Result Date: 09/29/2020 CLINICAL DATA:  OG tube placement EXAM: ABDOMEN - 1 VIEW COMPARISON:  None. FINDINGS: Enteric tube tip is in the left upper quadrant consistent with location in the upper stomach. IMPRESSION: Enteric tube tip is in the left upper quadrant consistent with location in the upper stomach. Electronically Signed   By: Lucienne Capers M.D.   On: 09/29/2020 19:46   CT HEAD WO CONTRAST (5MM)  Result Date: 09/29/2020 CLINICAL DATA:  Encephalopathy EXAM: CT HEAD WITHOUT CONTRAST TECHNIQUE: Contiguous axial images were obtained from the base of the skull through the vertex without intravenous contrast. COMPARISON:  None. FINDINGS: Brain: There is no mass, hemorrhage or extra-axial collection. The size and configuration of the ventricles and extra-axial CSF spaces are normal. The brain parenchyma is normal, without acute or chronic infarction. Vascular: No abnormal hyperdensity of the major intracranial arteries or dural venous sinuses. No intracranial atherosclerosis. Skull: The visualized skull base, calvarium and extracranial soft tissues are normal. Sinuses/Orbits: No fluid levels or advanced mucosal thickening of the visualized paranasal sinuses. No mastoid or middle ear effusion. The orbits are normal. IMPRESSION: Normal head CT. Electronically Signed  By: Ulyses Jarred M.D.   On: 09/29/2020 20:37   DG Chest Portable 1 View  Result Date: 09/29/2020 CLINICAL DATA:  Endotracheal tube and OG tube placements EXAM:  PORTABLE CHEST 1 VIEW COMPARISON:  07/22/2020 FINDINGS: Endotracheal tube was placed with tip measuring 5.2 cm above the carina. Enteric tube tip is not visualized off the field of view. Shallow inspiration. Cardiac enlargement. No vascular congestion, edema, or consolidation. Blunting of the costophrenic angles suggests small effusions. No pneumothorax. IMPRESSION: Appliances appear in satisfactory position. Cardiac enlargement. Small effusions. Electronically Signed   By: Lucienne Capers M.D.   On: 09/29/2020 19:46               LOS: 2 days   Betha Shadix  [facility]   Pager on [email or web address]. If 7PM-7AM, please contact night-coverage at [email or web address]     10/01/2020, 1:43 PM

## 2020-10-01 NOTE — Consult Note (Signed)
[facility] Consult   Reason for Consult:  Consult for 55 year old male, prefers [place] pronouns [name]") who overdosed on prescribed medications Referring Physician:  Dewald Patient Identification: [name] MRN:  [id number] Principal Diagnosis: Overdose Diagnosis:  Principal Problem:   Overdose Active Problems:   Obesity   Chronic low back pain (Bilateral) (R>L) w/ sciatica (Bilateral)   DM2 (diabetes mellitus, type 2) (HCC)   Venous stasis dermatitis of both lower extremities   Suicidal ideation   Acute on chronic respiratory failure with hypoxia (HCC)   Endotracheally intubated   On mechanically assisted ventilation (HCC)   AKI (acute kidney injury) ([place])   Acute encephalopathy   Lactic acidosis   Depression   Total Time spent with patient: 20 minutes  Subjective:  "I don't need inpatient psychiatric care." [name] is a 55 y.o. adult patient admitted with overdose of tizanidine and losartan. Patient seen and chart reviewed.  Patient was moved out of ICU last evening.  On approach, patient was alert, cooperative.  He states that representative from [place] visited this to him this morning and he is going to be getting mental health services through [facility] at discharge.  Patient says he "feels good" which is congruent with his affect.  He again states that he will "never do that again" and identifies supports from his family.  He states that he has been hospitalized psychiatrically once before and did not find it helpful.  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  He denies increased depression today.  Denies suicidal or homicidal ideations, paranoia, auditory or visual hallucinations.  Patient was pleasant and conversant, smiling and laughing at times.   Past Psychiatric History: Depression  Risk to Self:   Risk to Others:   Prior Inpatient  Therapy:   Prior Outpatient Therapy:    Past Medical History:  Past Medical History:  Diagnosis Date   COPD (chronic obstructive pulmonary disease) ([place])    Coronary artery disease    Diabetes mellitus without complication ([place])    Hyperlipidemia    Hypertension    Peptic ulcer    Sleep apnea     Past Surgical History:  Procedure Laterality Date   COLONOSCOPY WITH PROPOFOL N/A 11/09/2016   Procedure: COLONOSCOPY WITH PROPOFOL;  Surgeon: Lollie Sails, MD;  Location[facility] ENDOSCOPY;  Service: Endoscopy;  Laterality: N/A;   COLONOSCOPY WITH PROPOFOL N/A 07/06/2017   Procedure: COLONOSCOPY WITH PROPOFOL;  Surgeon: Lollie Sails, MD;  Location: [facility] ENDOSCOPY;  Service: Endoscopy;  Laterality: N/A;   ESOPHAGOGASTRODUODENOSCOPY (EGD) WITH PROPOFOL N/A 11/09/2016   Procedure: ESOPHAGOGASTRODUODENOSCOPY (EGD) WITH PROPOFOL;  Surgeon: Lollie Sails, MD;  Location[facility] ENDOSCOPY;  Service: Endoscopy;  Laterality: N/A;   ESOPHAGOGASTRODUODENOSCOPY (EGD) WITH PROPOFOL N/A 07/06/2017   Procedure: ESOPHAGOGASTRODUODENOSCOPY (EGD) WITH PROPOFOL;  Surgeon: Lollie Sails, MD;  Location: [facility] ENDOSCOPY;  Service: Endoscopy;  Laterality: N/A;   Family History:  Family History  Problem Relation Age of Onset   Diabetes Mother    Diabetes Father    Stomach cancer Father    Heart attack Father    Alcohol abuse Father    Heart attack Sister    Heart attack Brother    Heart attack Paternal Aunt    Heart attack Paternal Uncle    Family Psychiatric  History: none known Social History:  Social History   Substance and Sexual Activity  Alcohol  Use No     Social History   Substance and Sexual Activity  Drug Use No    Social History   Socioeconomic History   Marital status: Single    Spouse name: Not on file   Number of children: Not on file   Years of education: Not on file   Highest education level: Not on file  Occupational History   Not on file  Tobacco Use    Smoking status: Former    Packs/day: 0.75    Years: 30.00    Pack years: 22.50    Types: Cigarettes    Quit date: 08/13/2019    Years since quitting: 1.1   Smokeless tobacco: Never   Tobacco comments:    trying decrease  Vaping Use   Vaping Use: Never used  Substance and Sexual Activity   Alcohol use: No   Drug use: No   Sexual activity: Not Currently  Other Topics Concern   Not on file  Social History Narrative   Not on file   Social Determinants of Health   Financial Resource Strain: Not on file  Food Insecurity: Not on file  Transportation Needs: Not on file  Physical Activity: Not on file  Stress: Not on file  Social Connections: Not on file   Additional Social History:    Allergies:   Allergies  Allergen Reactions   Shellfish Allergy Swelling   Codeine Rash   Lisinopril Cough    Labs:  Results for orders placed or performed during the hospital encounter of 09/29/20 (from the past 48 hour(s))  Lactic acid, plasma     Status: Abnormal   Collection Time: 09/29/20  7:14 PM  Result Value Ref Range   Lactic Acid, Venous 2.5 (HH) 0.5 - 1.9 mmol/L    Comment: CRITICAL RESULT CALLED TO, READ BACK BY AND VERIFIED WITH [place] 09/29/2020 DLB Performed at [facility], [address] [place], [place] [address]   Comprehensive metabolic panel     Status: Abnormal   Collection Time: 09/29/20  7:14 PM  Result Value Ref Range   Sodium 135 135 - 145 mmol/L   Potassium 3.8 3.5 - 5.1 mmol/L   Chloride 106 98 - 111 mmol/L   CO2 23 22 - 32 mmol/L   Glucose, Bld 175 (H) 70 - 99 mg/dL    Comment: Glucose reference range applies only to samples taken after fasting for at least 8 hours.   BUN 18 6 - 20 mg/dL   Creatinine, Ser 1.31 (H) 0.61 - 1.24 mg/dL   Calcium 8.9 8.9 - 10.3 mg/dL   Total Protein 5.8 (L) 6.5 - 8.1 g/dL   Albumin 3.1 (L) 3.5 - 5.0 g/dL   AST 22 15 - 41 U/L   ALT 14 0 - 44 U/L   Alkaline Phosphatase 50 38 - 126 U/L   Total Bilirubin  0.7 0.3 - 1.2 mg/dL   GFR, Estimated >60 >60 mL/min    Comment: (NOTE) Calculated using the CKD-EPI Creatinine Equation (2021)    Anion gap 6 5 - 15    Comment: Performed at [facility], [address] [place], [place] [address]  CBC WITH DIFFERENTIAL     Status: Abnormal   Collection Time: 09/29/20  7:14 PM  Result Value Ref Range   WBC 9.7 4.0 - 10.5 K/uL   RBC 4.27 4.22 - 5.81 MIL/uL   Hemoglobin 12.1 (L) 13.0 - 17.0 g/dL   HCT 37.1 (L) 39.0 - 52.0 %  MCV 86.9 80.0 - 100.0 fL   MCH 28.3 26.0 - 34.0 pg   MCHC 32.6 30.0 - 36.0 g/dL   RDW 13.2 11.5 - 15.5 %   Platelets 226 150 - 400 K/uL   nRBC 0.0 0.0 - 0.2 %   Neutrophils Relative % 77 %   Neutro Abs 7.5 1.7 - 7.7 K/uL   Lymphocytes Relative 10 %   Lymphs Abs 1.0 0.7 - 4.0 K/uL   Monocytes Relative 8 %   Monocytes Absolute 0.8 0.1 - 1.0 K/uL   Eosinophils Relative 3 %   Eosinophils Absolute 0.3 0.0 - 0.5 K/uL   Basophils Relative 1 %   Basophils Absolute 0.1 0.0 - 0.1 K/uL   Immature Granulocytes 1 %   Abs Immature Granulocytes 0.09 (H) 0.00 - 0.07 K/uL    Comment: Performed at [facility], [address] [place], [place] [address]  Protime-INR     Status: None   Collection Time: 09/29/20  7:14 PM  Result Value Ref Range   Prothrombin Time 14.3 11.4 - 15.2 seconds   INR 1.1 0.8 - 1.2    Comment: (NOTE) INR goal varies based on device and disease states. Performed at [facility], [address] [place], [place] [address]   APTT     Status: None   Collection Time: 09/29/20  7:14 PM  Result Value Ref Range   aPTT 25 24 - 36 seconds    Comment: Performed at [facility], [address] [place], [place] [address]  Blood culture (routine single)     Status: None (Preliminary result)   Collection Time: 09/29/20  7:14 PM   Specimen: BLOOD  Result Value Ref Range   Specimen Description BLOOD BLOOD LEFT FOREARM    Special Requests      BOTTLES DRAWN AEROBIC AND ANAEROBIC  Blood Culture adequate volume   Culture      NO GROWTH 2 DAYS Performed at [facility], [address], [place], [place] [address]    Report Status PENDING   Urinalysis, Complete w Microscopic     Status: Abnormal   Collection Time: 09/29/20  7:14 PM  Result Value Ref Range   Color, Urine YELLOW YELLOW   APPearance CLEAR CLEAR   Specific Gravity, Urine >1.030 (H) 1.005 - 1.030   pH 5.5 5.0 - 8.0   Glucose, UA NEGATIVE NEGATIVE mg/dL   Hgb urine dipstick TRACE (A) NEGATIVE   Bilirubin Urine NEGATIVE NEGATIVE   Ketones, ur NEGATIVE NEGATIVE mg/dL   Protein, ur 100 (A) NEGATIVE mg/dL   Nitrite NEGATIVE NEGATIVE   Leukocytes,Ua NEGATIVE NEGATIVE   Squamous Epithelial / LPF 0-5 0 - 5   WBC, UA 0-5 0 - 5 WBC/hpf   RBC / HPF 0-5 0 - 5 RBC/hpf   Bacteria, UA RARE (A) NONE SEEN   Mucus PRESENT     Comment: Performed at [facility], [address], [place], [place] [address]  Resp Panel by RT-PCR (Flu A&B, Covid)     Status: None   Collection Time: 09/29/20  7:14 PM   Specimen: Nasopharyngeal(NP) swabs in vial transport medium  Result Value Ref Range   SARS Coronavirus 2 by RT PCR NEGATIVE NEGATIVE    Comment: (NOTE) SARS-CoV-2 target nucleic acids are NOT DETECTED.  The SARS-CoV-2 RNA is generally detectable in upper respiratory specimens during the acute phase of infection. The lowest concentration of SARS-CoV-2 viral copies this assay can detect is 138 copies/mL. A negative  result does not preclude SARS-Cov-2 infection and should not be used as the sole basis for treatment or other patient management decisions. A negative result may occur with  improper specimen collection/handling, submission of specimen other than nasopharyngeal swab, presence of viral mutation(s) within the areas targeted by this assay, and inadequate number of viral copies(<138 copies/mL). A negative result must be combined with clinical observations, patient history, and  epidemiological information. The expected result is Negative.  Fact Sheet for Patients:  EntrepreneurPulse.com.au  Fact Sheet for Healthcare Providers:  IncredibleEmployment.be  This test is no t yet approved or cleared by the [place] FDA and  has been authorized for detection and/or diagnosis of SARS-CoV-2 by FDA under an Emergency Use Authorization (EUA). This EUA will remain  in effect (meaning this test can be used) for the duration of the COVID-19 declaration under Section 564(b)(1) of the Act, 21 U.S.C.section 360bbb-3(b)(1), unless the authorization is terminated  or revoked sooner.       Influenza A by PCR NEGATIVE NEGATIVE   Influenza B by PCR NEGATIVE NEGATIVE    Comment: (NOTE) The Xpert Xpress SARS-CoV-2/FLU/RSV plus assay is intended as an aid in the diagnosis of influenza from Nasopharyngeal swab specimens and should not be used as a sole basis for treatment. Nasal washings and aspirates are unacceptable for Xpert Xpress SARS-CoV-2/FLU/RSV testing.  Fact Sheet for Patients: EntrepreneurPulse.com.au  Fact Sheet for Healthcare Providers: IncredibleEmployment.be  This test is not yet approved or cleared by the [place] FDA and has been authorized for detection and/or diagnosis of SARS-CoV-2 by FDA under an Emergency Use Authorization (EUA). This EUA will remain in effect (meaning this test can be used) for the duration of the COVID-19 declaration under Section 564(b)(1) of the Act, 21 U.S.C. section 360bbb-3(b)(1), unless the authorization is terminated or revoked.  Performed at [facility], [address], [place], [place] [address]   Acetaminophen level     Status: Abnormal   Collection Time: 09/29/20  7:14 PM  Result Value Ref Range   Acetaminophen (Tylenol), Serum <10 (L) 10 - 30 ug/mL    Comment: (NOTE) Therapeutic concentrations vary significantly. A range of  10-30 ug/mL  may be an effective concentration for many patients. However, some  are best treated at concentrations outside of this range. Acetaminophen concentrations >150 ug/mL at 4 hours after ingestion  and >50 ug/mL at 12 hours after ingestion are often associated with  toxic reactions.  Performed at [facility], [address] [place], [place] [address]   Salicylate level     Status: Abnormal   Collection Time: 09/29/20  7:14 PM  Result Value Ref Range   Salicylate Lvl <3.2 (L) 7.0 - 30.0 mg/dL    Comment: Performed at [facility], [address] [place], [place] [address]  Urine Drug Screen, Qualitative ([facility] only)     Status: None   Collection Time: 09/29/20  7:14 PM  Result Value Ref Range   Tricyclic, Ur Screen NONE DETECTED NONE DETECTED   Amphetamines, Ur Screen NONE DETECTED NONE DETECTED   MDMA (Ecstasy)Ur Screen NONE DETECTED NONE DETECTED   Cocaine Metabolite,Ur [place] NONE DETECTED NONE DETECTED   Opiate, Ur Screen NONE DETECTED NONE DETECTED   Phencyclidine (PCP) Ur S NONE DETECTED NONE DETECTED   Cannabinoid 50 Ng, Ur [place] NONE DETECTED NONE DETECTED   Barbiturates, Ur Screen NONE DETECTED NONE DETECTED   Benzodiazepine, Ur Scrn NONE DETECTED NONE DETECTED   Methadone Scn, Ur NONE DETECTED NONE  DETECTED    Comment: (NOTE) Tricyclics + metabolites, urine    Cutoff 1000 ng/mL Amphetamines + metabolites, urine  Cutoff 1000 ng/mL MDMA (Ecstasy), urine              Cutoff 500 ng/mL Cocaine Metabolite, urine          Cutoff 300 ng/mL Opiate + metabolites, urine        Cutoff 300 ng/mL Phencyclidine (PCP), urine         Cutoff 25 ng/mL Cannabinoid, urine                 Cutoff 50 ng/mL Barbiturates + metabolites, urine  Cutoff 200 ng/mL Benzodiazepine, urine              Cutoff 200 ng/mL Methadone, urine                   Cutoff 300 ng/mL  The urine drug screen provides only a preliminary, unconfirmed analytical test result and should not be used  for non-medical purposes. Clinical consideration and professional judgment should be applied to any positive drug screen result due to possible interfering substances. A more specific alternate chemical method must be used in order to obtain a confirmed analytical result. Gas chromatography / mass spectrometry (GC/MS) is the preferred confirm atory method. Performed at [facility], [address] [place], [place] [address]   Magnesium     Status: None   Collection Time: 09/29/20  7:14 PM  Result Value Ref Range   Magnesium 1.9 1.7 - 2.4 mg/dL    Comment: Performed at [facility], [address] [place], [place] [address]  Phosphorus     Status: None   Collection Time: 09/29/20  7:14 PM  Result Value Ref Range   Phosphorus 2.8 2.5 - 4.6 mg/dL    Comment: Performed at [facility], [address] [place], [place] [address]  Procalcitonin - Baseline     Status: None   Collection Time: 09/29/20  7:14 PM  Result Value Ref Range   Procalcitonin <0.10 ng/mL    Comment:        Interpretation: PCT (Procalcitonin) <= 0.5 ng/mL: Systemic infection (sepsis) is not likely. Local bacterial infection is possible. (NOTE)       Sepsis PCT Algorithm           Lower Respiratory Tract                                      Infection PCT Algorithm    ----------------------------     ----------------------------         PCT < 0.25 ng/mL                PCT < 0.10 ng/mL          Strongly encourage             Strongly discourage   discontinuation of antibiotics    initiation of antibiotics    ----------------------------     -----------------------------       PCT 0.25 - 0.50 ng/mL            PCT 0.10 - 0.25 ng/mL               OR       >80% decrease in PCT            Discourage initiation of  antibiotics      Encourage discontinuation           of antibiotics    ----------------------------      -----------------------------         PCT >= 0.50 ng/mL              PCT 0.26 - 0.50 ng/mL               AND        <80% decrease in PCT             Encourage initiation of                                             antibiotics       Encourage continuation           of antibiotics    ----------------------------     -----------------------------        PCT >= 0.50 ng/mL                  PCT > 0.50 ng/mL               AND         increase in PCT                  Strongly encourage                                      initiation of antibiotics    Strongly encourage escalation           of antibiotics                                     -----------------------------                                           PCT <= 0.25 ng/mL                                                 OR                                        > 80% decrease in PCT                                      Discontinue / Do not initiate                                             antibiotics  Performed at [facility], [address] [place], [place] [address]   Blood gas, arterial     Status: Abnormal   Collection  Time: 09/29/20  7:38 PM  Result Value Ref Range   FIO2 70.00    VT 500 mL   Peep/cpap 5.0 cm H20   pH, Arterial 7.35 7.350 - 7.450   pCO2 arterial 37 32.0 - 48.0 mmHg   pO2, Arterial 200 (H) 83.0 - 108.0 mmHg   Bicarbonate 20.4 20.0 - 28.0 mmol/L   Acid-base deficit 4.7 (H) 0.0 - 2.0 mmol/L   O2 Saturation 99.7 %   Patient temperature 37.0    Collection site RIGHT RADIAL    Sample type ARTERIAL DRAW    Allens test (pass/fail) PASS PASS   Mechanical Rate 20     Comment: Performed at [facility], [address] [place], [place] [address]  Lactic acid, plasma     Status: Abnormal   Collection Time: 09/29/20 10:00 PM  Result Value Ref Range   Lactic Acid, Venous 2.1 (HH) 0.5 - 1.9 mmol/L    Comment: CRITICAL VALUE NOTED. VALUE IS CONSISTENT WITH PREVIOUSLY REPORTED/CALLED VALUE  DLB Performed at [facility], [address] [place], [place] [address]   HIV Antibody (routine testing w rflx)     Status: None   Collection Time: 09/29/20 10:00 PM  Result Value Ref Range   HIV Screen 4th Generation wRfx Non Reactive Non Reactive    Comment: Performed at [facility], [address], [place], [place] [address]  Glucose, capillary     Status: Abnormal   Collection Time: 09/29/20 11:31 PM  Result Value Ref Range   Glucose-Capillary 136 (H) 70 - 99 mg/dL    Comment: Glucose reference range applies only to samples taken after fasting for at least 8 hours.  MRSA Next Gen by PCR, Nasal     Status: None   Collection Time: 09/30/20 12:10 AM   Specimen: Nasal Mucosa; Nasal Swab  Result Value Ref Range   MRSA by PCR Next Gen NOT DETECTED NOT DETECTED    Comment: (NOTE) The GeneXpert MRSA Assay (FDA approved for NASAL specimens only), is one component of a comprehensive MRSA colonization surveillance program. It is not intended to diagnose MRSA infection nor to guide or monitor treatment for MRSA infections. Test performance is not FDA approved in patients less than 49 years old. Performed at [facility], [address] [place], [place] [address]   Blood gas, arterial     Status: Abnormal   Collection Time: 09/30/20  5:08 AM  Result Value Ref Range   FIO2 0.28    Delivery systems VENTILATOR    Mode PRESSURE REGULATED VOLUME CONTROL    VT 500 mL   LHR 20 resp/min   Peep/cpap 5.0 cm H20   pH, Arterial 7.31 (L) 7.350 - 7.450   pCO2 arterial 39 32.0 - 48.0 mmHg   pO2, Arterial 103 83.0 - 108.0 mmHg   Bicarbonate 19.6 (L) 20.0 - 28.0 mmol/L   Acid-base deficit 6.2 (H) 0.0 - 2.0 mmol/L   O2 Saturation 97.4 %   Patient temperature 37.0    Collection site RIGHT RADIAL    Sample type ARTERIAL DRAW    Allens test (pass/fail) PASS PASS   Mechanical Rate 20     Comment: Performed at [facility], [address] [place], [place]  [address]  Glucose, capillary     Status: Abnormal   Collection Time: 09/30/20  5:12 AM  Result Value Ref Range   Glucose-Capillary 128 (H) 70 - 99 mg/dL    Comment: Glucose reference range applies  only to samples taken after fasting for at least 8 hours.  Hemoglobin A1c     Status: None   Collection Time: 09/30/20  5:46 AM  Result Value Ref Range   Hgb A1c MFr Bld 5.4 4.8 - 5.6 %    Comment: (NOTE) Pre diabetes:          5.7%-6.4%  Diabetes:              >6.4%  Glycemic control for   <7.0% adults with diabetes    Mean Plasma Glucose 108.28 mg/dL    Comment: Performed at [facility] [address] [place], [place] [address]  CBC     Status: Abnormal   Collection Time: 09/30/20  5:46 AM  Result Value Ref Range   WBC 13.9 (H) 4.0 - 10.5 K/uL   RBC 4.31 4.22 - 5.81 MIL/uL   Hemoglobin 12.6 (L) 13.0 - 17.0 g/dL   HCT 38.5 (L) 39.0 - 52.0 %   MCV 89.3 80.0 - 100.0 fL   MCH 29.2 26.0 - 34.0 pg   MCHC 32.7 30.0 - 36.0 g/dL   RDW 13.2 11.5 - 15.5 %   Platelets 311 150 - 400 K/uL   nRBC 0.0 0.0 - 0.2 %    Comment: Performed at [facility], [address], [place], [place] [address]  Magnesium     Status: None   Collection Time: 09/30/20  5:46 AM  Result Value Ref Range   Magnesium 1.8 1.7 - 2.4 mg/dL    Comment: Performed at [facility], [address] [place], [place] [address]  Phosphorus     Status: None   Collection Time: 09/30/20  5:46 AM  Result Value Ref Range   Phosphorus 4.3 2.5 - 4.6 mg/dL    Comment: Performed at [facility], [address] [place], [place] [address]  Comprehensive metabolic panel     Status: Abnormal   Collection Time: 09/30/20  5:46 AM  Result Value Ref Range   Sodium 134 (L) 135 - 145 mmol/L   Potassium 4.4 3.5 - 5.1 mmol/L   Chloride 108 98 - 111 mmol/L   CO2 19 (L) 22 - 32 mmol/L   Glucose, Bld 122 (H) 70 - 99 mg/dL    Comment: Glucose reference range applies only to samples taken after fasting for at  least 8 hours.   BUN 22 (H) 6 - 20 mg/dL   Creatinine, Ser 2.10 (H) 0.61 - 1.24 mg/dL   Calcium 8.2 (L) 8.9 - 10.3 mg/dL   Total Protein 5.8 (L) 6.5 - 8.1 g/dL   Albumin 3.2 (L) 3.5 - 5.0 g/dL   AST 18 15 - 41 U/L   ALT 14 0 - 44 U/L   Alkaline Phosphatase 48 38 - 126 U/L   Total Bilirubin 0.6 0.3 - 1.2 mg/dL   GFR, Estimated 36 (L) >60 mL/min    Comment: (NOTE) Calculated using the CKD-EPI Creatinine Equation (2021)    Anion gap 7 5 - 15    Comment: Performed at [facility], [address] [place], [place] [address]  Triglycerides     Status: Abnormal   Collection Time: 09/30/20  5:46 AM  Result Value Ref Range   Triglycerides 467 (H) <150 mg/dL    Comment: Performed at [facility], [address] [place], [place] [address]  Procalcitonin     Status: None   Collection Time: 09/30/20  5:46 AM  Result Value Ref Range  Procalcitonin <0.10 ng/mL    Comment:        Interpretation: PCT (Procalcitonin) <= 0.5 ng/mL: Systemic infection (sepsis) is not likely. Local bacterial infection is possible. (NOTE)       Sepsis PCT Algorithm           Lower Respiratory Tract                                      Infection PCT Algorithm    ----------------------------     ----------------------------         PCT < 0.25 ng/mL                PCT < 0.10 ng/mL          Strongly encourage             Strongly discourage   discontinuation of antibiotics    initiation of antibiotics    ----------------------------     -----------------------------       PCT 0.25 - 0.50 ng/mL            PCT 0.10 - 0.25 ng/mL               OR       >80% decrease in PCT            Discourage initiation of                                            antibiotics      Encourage discontinuation           of antibiotics    ----------------------------     -----------------------------         PCT >= 0.50 ng/mL              PCT 0.26 - 0.50 ng/mL               AND        <80% decrease in PCT              Encourage initiation of                                             antibiotics       Encourage continuation           of antibiotics    ----------------------------     -----------------------------        PCT >= 0.50 ng/mL                  PCT > 0.50 ng/mL               AND         increase in PCT                  Strongly encourage                                      initiation of antibiotics    Strongly encourage escalation           of antibiotics                                     -----------------------------  PCT <= 0.25 ng/mL                                                 OR                                        > 80% decrease in PCT                                      Discontinue / Do not initiate                                             antibiotics  Performed at [facility], [address] [place], [place] [address]   Lactic acid, plasma     Status: None   Collection Time: 09/30/20  5:46 AM  Result Value Ref Range   Lactic Acid, Venous 1.2 0.5 - 1.9 mmol/L    Comment: Performed at [facility], [address] [place], [place] [address]  Acetaminophen level     Status: Abnormal   Collection Time: 09/30/20  5:46 AM  Result Value Ref Range   Acetaminophen (Tylenol), Serum <10 (L) 10 - 30 ug/mL    Comment: (NOTE) Therapeutic concentrations vary significantly. A range of 10-30 ug/mL  may be an effective concentration for many patients. However, some  are best treated at concentrations outside of this range. Acetaminophen concentrations >150 ug/mL at 4 hours after ingestion  and >50 ug/mL at 12 hours after ingestion are often associated with  toxic reactions.  Performed at [facility], [address] [place], [place] [address]   Glucose, capillary     Status: Abnormal   Collection Time: 09/30/20  7:42 AM  Result Value Ref Range   Glucose-Capillary 114 (H) 70 - 99 mg/dL     Comment: Glucose reference range applies only to samples taken after fasting for at least 8 hours.  Glucose, capillary     Status: Abnormal   Collection Time: 09/30/20 12:22 PM  Result Value Ref Range   Glucose-Capillary 104 (H) 70 - 99 mg/dL    Comment: Glucose reference range applies only to samples taken after fasting for at least 8 hours.  Glucose, capillary     Status: None   Collection Time: 09/30/20  3:22 PM  Result Value Ref Range   Glucose-Capillary 96 70 - 99 mg/dL    Comment: Glucose reference range applies only to samples taken after fasting for at least 8 hours.  Glucose, capillary     Status: Abnormal   Collection Time: 09/30/20  7:41 PM  Result Value Ref Range   Glucose-Capillary 112 (H) 70 - 99 mg/dL    Comment: Glucose reference range applies only to samples taken after fasting for at least 8 hours.  Glucose, capillary     Status: Abnormal   Collection Time: 09/30/20 10:11 PM  Result Value Ref Range   Glucose-Capillary 111 (H) 70 - 99 mg/dL    Comment: Glucose reference range applies only to samples taken after fasting for at least  8 hours.  Procalcitonin     Status: None   Collection Time: 10/01/20  4:16 AM  Result Value Ref Range   Procalcitonin 0.17 ng/mL    Comment:        Interpretation: PCT (Procalcitonin) <= 0.5 ng/mL: Systemic infection (sepsis) is not likely. Local bacterial infection is possible. (NOTE)       Sepsis PCT Algorithm           Lower Respiratory Tract                                      Infection PCT Algorithm    ----------------------------     ----------------------------         PCT < 0.25 ng/mL                PCT < 0.10 ng/mL          Strongly encourage             Strongly discourage   discontinuation of antibiotics    initiation of antibiotics    ----------------------------     -----------------------------       PCT 0.25 - 0.50 ng/mL            PCT 0.10 - 0.25 ng/mL               OR       >80% decrease in PCT             Discourage initiation of                                            antibiotics      Encourage discontinuation           of antibiotics    ----------------------------     -----------------------------         PCT >= 0.50 ng/mL              PCT 0.26 - 0.50 ng/mL               AND        <80% decrease in PCT             Encourage initiation of                                             antibiotics       Encourage continuation           of antibiotics    ----------------------------     -----------------------------        PCT >= 0.50 ng/mL                  PCT > 0.50 ng/mL               AND         increase in PCT                  Strongly encourage  initiation of antibiotics    Strongly encourage escalation           of antibiotics                                     -----------------------------                                           PCT <= 0.25 ng/mL                                                 OR                                        > 80% decrease in PCT                                      Discontinue / Do not initiate                                             antibiotics  Performed at [facility], [address] [place], [place] [address]   Basic metabolic panel     Status: Abnormal   Collection Time: 10/01/20  4:16 AM  Result Value Ref Range   Sodium 138 135 - 145 mmol/L   Potassium 4.5 3.5 - 5.1 mmol/L   Chloride 111 98 - 111 mmol/L   CO2 23 22 - 32 mmol/L   Glucose, Bld 112 (H) 70 - 99 mg/dL    Comment: Glucose reference range applies only to samples taken after fasting for at least 8 hours.   BUN 29 (H) 6 - 20 mg/dL   Creatinine, Ser 3.83 (H) 0.61 - 1.24 mg/dL   Calcium 8.5 (L) 8.9 - 10.3 mg/dL   GFR, Estimated 18 (L) >60 mL/min    Comment: (NOTE) Calculated using the CKD-EPI Creatinine Equation (2021)    Anion gap 4 (L) 5 - 15    Comment: Performed at [facility], [address],  [place], [place] [address]  Magnesium     Status: Abnormal   Collection Time: 10/01/20  4:16 AM  Result Value Ref Range   Magnesium 2.5 (H) 1.7 - 2.4 mg/dL    Comment: Performed at [facility], [address] [place], [place] [address]  Phosphorus     Status: None   Collection Time: 10/01/20  4:16 AM  Result Value Ref Range   Phosphorus 4.4 2.5 - 4.6 mg/dL    Comment: Performed at [facility], [address] [place], [place] [address]  CBC     Status: Abnormal   Collection Time: 10/01/20  4:16 AM  Result Value Ref Range   WBC 7.9 4.0 - 10.5 K/uL   RBC 4.30 4.22 - 5.81 MIL/uL   Hemoglobin 12.1 (L) 13.0 - 17.0 g/dL   HCT 38.1 (L) 39.0 -  52.0 %   MCV 88.6 80.0 - 100.0 fL   MCH 28.1 26.0 - 34.0 pg   MCHC 31.8 30.0 - 36.0 g/dL   RDW 13.4 11.5 - 15.5 %   Platelets 218 150 - 400 K/uL   nRBC 0.0 0.0 - 0.2 %    Comment: Performed at [facility], [address] [place], [place] [address]  Glucose, capillary     Status: Abnormal   Collection Time: 10/01/20  7:53 AM  Result Value Ref Range   Glucose-Capillary 114 (H) 70 - 99 mg/dL    Comment: Glucose reference range applies only to samples taken after fasting for at least 8 hours.  Glucose, capillary     Status: None   Collection Time: 10/01/20 11:14 AM  Result Value Ref Range   Glucose-Capillary 92 70 - 99 mg/dL    Comment: Glucose reference range applies only to samples taken after fasting for at least 8 hours.    Current Facility-Administered Medications  Medication Dose Route Frequency Provider Last Rate Last Admin   0.9 %  sodium chloride infusion  250 mL Intravenous Continuous Rust-Chester, Britton L, NP       albuterol (PROVENTIL) (2.5 MG/3ML) 0.083% nebulizer solution 3 mL  3 mL Nebulization Q4H PRN Graves, Raeford Razor, NP       atorvastatin (LIPITOR) tablet 20 mg  20 mg Oral QHS Graves, Raeford Razor, NP   20 mg at 09/30/20 2144   butalbital-acetaminophen-caffeine (FIORICET) 50-325-40 MG per tablet 1 tablet  1  tablet Oral Q6H PRN Rust-Chester, Huel Cote, NP   1 tablet at 10/01/20 0022   cefTRIAXone (ROCEPHIN) 1 g in sodium chloride 0.9 % 100 mL IVPB  1 g Intravenous Q12H Rust-Chester, Britton L, NP 200 mL/hr at 10/01/20 0946 1 g at 10/01/20 0946   docusate sodium (COLACE) capsule 100 mg  100 mg Oral BID PRN Rust-Chester, Huel Cote, NP       docusate sodium (COLACE) capsule 100 mg  100 mg Oral BID Milus Banister, NP   100 mg at 10/01/20 0813   heparin injection 7,500 Units  7,500 Units Subcutaneous Q8H Shanlever, Pierce Crane, RPH       [START ON 10/02/2020] influenza vac split quadrivalent PF (FLUARIX) injection 0.5 mL  0.5 mL Intramuscular Tomorrow-1000 Freda Jackson B, MD       insulin aspart (novoLOG) injection 0-15 Units  0-15 Units Subcutaneous TID AC & HS Rust-Chester, Huel Cote, NP       lactated ringers infusion   Intravenous Continuous Jennye Boroughs, MD 125 mL/hr at 10/01/20 0822 New Bag at 10/01/20 0822   loratadine (CLARITIN) tablet 10 mg  10 mg Oral Daily Milus Banister, NP   10 mg at 10/01/20 0813   pantoprazole (PROTONIX) injection 40 mg  40 mg Intravenous QHS Rust-Chester, Britton L, NP   40 mg at 09/30/20 2145   [START ON 10/02/2020] pneumococcal 23 valent vaccine (PNEUMOVAX-23) injection 0.5 mL  0.5 mL Intramuscular Tomorrow-1000 Freddi Starr, MD       polyethylene glycol (MIRALAX / GLYCOLAX) packet 17 g  17 g Oral Daily PRN Rust-Chester, Huel Cote, NP       simethicone (MYLICON) chewable tablet 80 mg  80 mg Oral Q6H PRN Graves, Dana E, NP       triamcinolone ointment (KENALOG) 0.1 %   Topical QHS PRN Graves, Dana E, NP       umeclidinium-vilanterol (ANORO ELLIPTA) 62.5-25 MCG/INH 1 puff  1 puff Inhalation Daily  Milus Banister, NP   1 puff at 10/01/20 1610    Musculoskeletal: Strength & Muscle Tone: decreased Gait & Station:  not assessed Patient leans: N/A    Psychiatric Specialty Exam:  Presentation  General Appearance: Appropriate for Environment  Eye  Contact:Good  Speech:Clear and Coherent  Speech Volume:Normal  Handedness: No data recorded  Mood and Affect  Mood:Depressed  Affect:Appropriate; Congruent   Thought Process  Thought Processes:Coherent  Descriptions of Associations:Intact  Orientation:Full (Time, Place and Person)  Thought Content:Logical  History of Schizophrenia/Schizoaffective disorder:No data recorded Duration of Psychotic Symptoms:No data recorded Hallucinations:Hallucinations: None  Ideas of Reference:None  Suicidal Thoughts:Suicidal Thoughts: No  Homicidal Thoughts:Homicidal Thoughts: No   Sensorium  Memory:Immediate Good; Recent Good  Judgment:Fair  Insight:Fair   Executive Functions  Concentration:Good  Attention Span:Good  Recall:Good  Fund of Knowledge:Fair  Language:Fair   Psychomotor Activity  Psychomotor Activity:Psychomotor Activity: Normal   Assets  Assets:Desire for Improvement; Financial Resources/Insurance   Sleep  Sleep:Sleep: Good   Physical Exam: Physical Exam Vitals and nursing note reviewed.  HENT:     Head: Normocephalic.  Eyes:     General:        Right eye: No discharge.        Left eye: No discharge.  Cardiovascular:     Rate and Rhythm: Normal rate.  Pulmonary:     Effort: Pulmonary effort is normal.  Musculoskeletal:        General: Normal range of motion.     Cervical back: Normal range of motion.  Neurological:     Mental Status: She is alert and oriented to person, place, and time.  Psychiatric:        Attention and Perception: Attention normal.        Mood and Affect: Mood is depressed.        Speech: Speech normal.        Behavior: Behavior normal.        Thought Content: Thought content normal.        Cognition and Memory: Cognition normal.        Judgment: Judgment is impulsive.   Review of Systems  Psychiatric/Behavioral:  Positive for depression (stable). Negative for hallucinations, memory loss, substance abuse and  suicidal ideas. The patient is not nervous/anxious and does not have insomnia.   Blood pressure (!) 158/81, pulse 96, temperature 98.6 F (37 C), temperature source Oral, resp. rate 17, height 5\' 5"  (1.651 m), weight (!) 143.9 kg, SpO2 100 %. Body mass index is 52.79 kg/m.  Treatment Plan Summary: Daily contact with patient to assess and evaluate symptoms and progress in treatment.   Disposition: No evidence of imminent risk to self or others at present.   Supportive therapy provided about ongoing stressors. Discussed crisis plan, support from social network, calling 911, coming to the Emergency Department, and calling Suicide Hotline.  Sherlon Handing, NP 10/01/2020 11:49 AM

## 2020-10-01 NOTE — TOC Progression Note (Signed)
Transition of Care [facility]) - Progression Note    Patient Details  Name: [name] MRN: [id number] Date of Birth: July 09, 1965  Transition of [facility]) CM/SW Duson, RN Phone Number: 10/01/2020, 12:01 PM  Clinical Narrative: Anticipates DC to [facility] when medically cleared.  Requires continued medical workup at this time      Expected Discharge Plan: Psychiatric Hospital Barriers to Discharge: Continued Medical Work up  Expected Discharge Plan and Services Expected Discharge Plan: [place] arrangements for the past 2 months: Single Family Home                                       Social Determinants of Health (SDOH) Interventions    Readmission Risk Interventions No flowsheet data found.

## 2020-10-02 DIAGNOSIS — T50902D Poisoning by unspecified drugs, medicaments and biological substances, intentional self-harm, subsequent encounter: Secondary | ICD-10-CM | POA: Diagnosis not present

## 2020-10-02 LAB — CBC
HCT: 35.5 % — ABNORMAL LOW (ref 39.0–52.0)
Hemoglobin: 11.5 g/dL — ABNORMAL LOW (ref 13.0–17.0)
MCH: 29 pg (ref 26.0–34.0)
MCHC: 32.4 g/dL (ref 30.0–36.0)
MCV: 89.4 fL (ref 80.0–100.0)
Platelets: 197 10*3/uL (ref 150–400)
RBC: 3.97 MIL/uL — ABNORMAL LOW (ref 4.22–5.81)
RDW: 13.2 % (ref 11.5–15.5)
WBC: 5.9 10*3/uL (ref 4.0–10.5)
nRBC: 0 % (ref 0.0–0.2)

## 2020-10-02 LAB — BASIC METABOLIC PANEL
Anion gap: 4 — ABNORMAL LOW (ref 5–15)
BUN: 24 mg/dL — ABNORMAL HIGH (ref 6–20)
CO2: 26 mmol/L (ref 22–32)
Calcium: 9.1 mg/dL (ref 8.9–10.3)
Chloride: 110 mmol/L (ref 98–111)
Creatinine, Ser: 2.96 mg/dL — ABNORMAL HIGH (ref 0.61–1.24)
GFR, Estimated: 24 mL/min — ABNORMAL LOW (ref >60)
Glucose, Bld: 99 mg/dL (ref 70–99)
Potassium: 4.6 mmol/L (ref 3.5–5.1)
Sodium: 140 mmol/L (ref 135–145)

## 2020-10-02 LAB — GLUCOSE, CAPILLARY: Glucose-Capillary: 115 mg/dL — ABNORMAL HIGH (ref 70–99)

## 2020-10-02 MED ORDER — ACETAMINOPHEN 325 MG PO TABS
650.0000 mg | ORAL_TABLET | Freq: Four times a day (QID) | ORAL | Status: DC | PRN
  Administered 2020-10-02: 650 mg via ORAL
  Filled 2020-10-02: qty 2

## 2020-10-02 MED ORDER — CEFTRIAXONE SODIUM 2 G IJ SOLR
2.0000 g | INTRAMUSCULAR | Status: DC
  Administered 2020-10-02: 22:00:00 2 g via INTRAVENOUS
  Filled 2020-10-02: qty 20
  Filled 2020-10-02: qty 2

## 2020-10-02 NOTE — Progress Notes (Signed)
PROGRESS NOTE    [name]  UJW:[id number] DOB: September 06, 1965 DOA: 09/29/2020 PCP: Theotis Burrow, MD    Brief Narrative: 55 y.o. adult with past medical history significant for COPD, CAD, type II DM, hypertension, hyperlipidemia, obstructive sleep apnea, peptic ulcer disease.  He was brought to the hospital because of unresponsiveness.  He was found on the floor for an unknown amount of time.  2 empty bottles of losartan and Zanaflex were found next to him on the floor.  Reportedly, his friend performed a wellness check on the patient after seeing a post on social media where the patient had made comments alluding to suicide.  Patient's nephew was murdered this year and recently a friend of the patient who had been staying with him committed suicide and the patient was positive.  Since then, patient has been struggling with depression, anxiety and agoraphobia.   He was admitted to the hospital for intentional drug overdose, suicide attempt, acute toxic encephalopathy, AKI and acute hypoxemic respiratory failure.  He was intubated and placed on mechanical ventilation for airway protection and acute respiratory failure.   Successfully extubated and transitioned to hospitalist service on 9/20.  Remains hypoxic requiring 3 to 4 L nasal cannula.   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)   DM2 (diabetes mellitus, type 2) (HCC)   Venous stasis dermatitis of both lower extremities   Suicidal ideation   Acute on chronic respiratory failure with hypoxia (HCC)   AKI (acute kidney injury) (HCC)   Acute encephalopathy   Lactic acidosis   Depression  Acute hypoxemic respiratory failure Status post extubation on 9/19 Remains on 4 L saturating high 90s Suspect atelectasis, unable to exclude aspiration pneumonia/pneumonitis Plan: Continue IV Rocephin for now, limit to 5 days  total Wean oxygen as tolerated Encourage incentive spirometry use several times an hour  Intentional drug overdose/suicide attempt Depression/anxiety Agoraphobia Psychiatry follow-up appreciated Patient remains under IVC  Acute kidney injury Creatinine peaked at 3.8, now downtrending Decrease rate of IV fluids to 75 cc/h Check BMP in a.m., can discontinue at that time if creatinine normalized  Suspected aspiration pneumonia Continue IV Rocephin  COPD Compensated, not acutely exacerbated Continue bronchodilator therapy  Acute toxic encephalopathy Resolved, mental status baseline  Hypertension Home losartan held secondary to AKI As needed hydralazine  Type 2 diabetes mellitus Sliding scale coverage   DVT prophylaxis: SQ heparin Code Status: (Full Family Communication: None today Disposition Plan: Status is: Inpatient  Remains inpatient appropriate because:Inpatient level of care appropriate due to severity of illness  Dispo: The patient is from: Home              Anticipated d/c is to:  BHU vs home              Patient currently is not medically stable to d/c.   Difficult to place patient No  Patient with resolving respiratory failure and resolving AKI.  Anticipate medical readiness for discharge by 9/22.  Psychiatry follow-up regarding disposition plan from there.     Level of care: Med-Surg  Consultants:  Psychiatry  Procedures:  Mechanical intubation  Antimicrobials:  Ceftriaxone   Subjective: Seen and examined.  Sitting up in bed.  No visible distress.  Answers questions appropriately.  No complaints.  Objective: Vitals:   10/01/20 1824 10/01/20 2013 10/02/20 0544 10/02/20 0748  BP: (!) 149/94 (!) 157/90 (!) 146/88  123/72  Pulse: (!) 101 95 (!) 104 99  Resp: 16 16 16 16   Temp: 98 F (36.7 C) 100 F (37.8 C) 98 F (36.7 C) 98.9 F (37.2 C)  TempSrc: Oral Oral Oral Oral  SpO2: 100% 100% 100% 98%  Weight:      Height:         Intake/Output Summary (Last 24 hours) at 10/02/2020 1045 Last data filed at 10/02/2020 0830 Gross per 24 hour  Intake 3235.88 ml  Output 1100 ml  Net 2135.88 ml   Filed Weights   09/29/20 1938 09/30/20 0000  Weight: (!) 145.2 kg (!) 143.9 kg    Examination:  General exam: Appears calm and comfortable  Respiratory system: Bibasilar crackles.  Normal work of breathing.  3 L Cardiovascular system: S1-S2, RRR, no murmurs, no pedal edema Gastrointestinal system: Obese, nontender, nondistended, normal bowel sounds Central nervous system: Alert and oriented. No focal neurological deficits. Extremities: Symmetric 5 x 5 power. Skin: No rashes, lesions or ulcers Psychiatry: Judgement and insight appear normal. Mood & affect appropriate.     Data Reviewed: I have personally reviewed following labs and imaging studies  CBC: Recent Labs  Lab 09/29/20 1914 09/30/20 0546 10/01/20 0416 10/02/20 0408  WBC 9.7 13.9* 7.9 5.9  NEUTROABS 7.5  --   --   --   HGB 12.1* 12.6* 12.1* 11.5*  HCT 37.1* 38.5* 38.1* 35.5*  MCV 86.9 89.3 88.6 89.4  PLT 226 311 218 829   Basic Metabolic Panel: Recent Labs  Lab 09/29/20 1914 09/30/20 0546 10/01/20 0416 10/02/20 0408  NA 135 134* 138 140  K 3.8 4.4 4.5 4.6  CL 106 108 111 110  CO2 23 19* 23 26  GLUCOSE 175* 122* 112* 99  BUN 18 22* 29* 24*  CREATININE 1.31* 2.10* 3.83* 2.96*  CALCIUM 8.9 8.2* 8.5* 9.1  MG 1.9 1.8 2.5*  --   PHOS 2.8 4.3 4.4  --    GFR: Estimated Creatinine Clearance (by C-G formula based on SCr of 2.96 mg/dL (H)) Male: 31.1 mL/min (A) Male: 37.7 mL/min (A) Liver Function Tests: Recent Labs  Lab 09/29/20 1914 09/30/20 0546  AST 22 18  ALT 14 14  ALKPHOS 50 48  BILITOT 0.7 0.6  PROT 5.8* 5.8*  ALBUMIN 3.1* 3.2*   No results for input(s): LIPASE, AMYLASE in the last 168 hours. No results for input(s): AMMONIA in the last 168 hours. Coagulation Profile: Recent Labs  Lab 09/29/20 1914  INR 1.1    Cardiac Enzymes: No results for input(s): CKTOTAL, CKMB, CKMBINDEX, TROPONINI in the last 168 hours. BNP (last 3 results) No results for input(s): PROBNP in the last 8760 hours. HbA1C: Recent Labs    09/30/20 0546  HGBA1C 5.4   CBG: Recent Labs  Lab 10/01/20 0753 10/01/20 1114 10/01/20 1640 10/01/20 2008 10/02/20 0730  GLUCAP 114* 92 114* 128* 115*   Lipid Profile: Recent Labs    09/30/20 0546  TRIG 467*   Thyroid Function Tests: No results for input(s): TSH, T4TOTAL, FREET4, T3FREE, THYROIDAB in the last 72 hours. Anemia Panel: No results for input(s): VITAMINB12, FOLATE, FERRITIN, TIBC, IRON, RETICCTPCT in the last 72 hours. Sepsis Labs: Recent Labs  Lab 09/29/20 1914 09/29/20 2200 09/30/20 0546 10/01/20 0416  PROCALCITON <0.10  --  <0.10 0.17  LATICACIDVEN 2.5* 2.1* 1.2  --     Recent Results (from the past 240 hour(s))  Blood culture (routine single)     Status: None (Preliminary result)   Collection  Time: 09/29/20  7:14 PM   Specimen: BLOOD  Result Value Ref Range Status   Specimen Description BLOOD BLOOD LEFT FOREARM  Final   Special Requests   Final    BOTTLES DRAWN AEROBIC AND ANAEROBIC Blood Culture adequate volume   Culture   Final    NO GROWTH 3 DAYS Performed at [facility], [address] [place], [place] [address]    Report Status PENDING  Incomplete  Resp Panel by RT-PCR (Flu A&B, Covid)     Status: None   Collection Time: 09/29/20  7:14 PM   Specimen: Nasopharyngeal(NP) swabs in vial transport medium  Result Value Ref Range Status   SARS Coronavirus 2 by RT PCR NEGATIVE NEGATIVE Final    Comment: (NOTE) SARS-CoV-2 target nucleic acids are NOT DETECTED.  The SARS-CoV-2 RNA is generally detectable in upper respiratory specimens during the acute phase of infection. The lowest concentration of SARS-CoV-2 viral copies this assay can detect is 138 copies/mL. A negative result does not preclude SARS-Cov-2 infection and should  not be used as the sole basis for treatment or other patient management decisions. A negative result may occur with  improper specimen collection/handling, submission of specimen other than nasopharyngeal swab, presence of viral mutation(s) within the areas targeted by this assay, and inadequate number of viral copies(<138 copies/mL). A negative result must be combined with clinical observations, patient history, and epidemiological information. The expected result is Negative.  Fact Sheet for Patients:  EntrepreneurPulse.com.au  Fact Sheet for Healthcare Providers:  IncredibleEmployment.be  This test is no t yet approved or cleared by the [place] FDA and  has been authorized for detection and/or diagnosis of SARS-CoV-2 by FDA under an Emergency Use Authorization (EUA). This EUA will remain  in effect (meaning this test can be used) for the duration of the COVID-19 declaration under Section 564(b)(1) of the Act, 21 U.S.C.section 360bbb-3(b)(1), unless the authorization is terminated  or revoked sooner.       Influenza A by PCR NEGATIVE NEGATIVE Final   Influenza B by PCR NEGATIVE NEGATIVE Final    Comment: (NOTE) The Xpert Xpress SARS-CoV-2/FLU/RSV plus assay is intended as an aid in the diagnosis of influenza from Nasopharyngeal swab specimens and should not be used as a sole basis for treatment. Nasal washings and aspirates are unacceptable for Xpert Xpress SARS-CoV-2/FLU/RSV testing.  Fact Sheet for Patients: EntrepreneurPulse.com.au  Fact Sheet for Healthcare Providers: IncredibleEmployment.be  This test is not yet approved or cleared by the [place] FDA and has been authorized for detection and/or diagnosis of SARS-CoV-2 by FDA under an Emergency Use Authorization (EUA). This EUA will remain in effect (meaning this test can be used) for the duration of the COVID-19 declaration under  Section 564(b)(1) of the Act, 21 U.S.C. section 360bbb-3(b)(1), unless the authorization is terminated or revoked.  Performed at [facility], [address] [place], [place] [address]   MRSA Next Gen by PCR, Nasal     Status: None   Collection Time: 09/30/20 12:10 AM   Specimen: Nasal Mucosa; Nasal Swab  Result Value Ref Range Status   MRSA by PCR Next Gen NOT DETECTED NOT DETECTED Final    Comment: (NOTE) The GeneXpert MRSA Assay (FDA approved for NASAL specimens only), is one component of a comprehensive MRSA colonization surveillance program. It is not intended to diagnose MRSA infection nor to guide or monitor treatment for MRSA infections. Test performance is not FDA approved in patients less than 51 years old. Performed  at [facility], [address], [place], [place] [address]          Radiology Studies: No results found.      Scheduled Meds:  atorvastatin  20 mg Oral QHS   docusate sodium  100 mg Oral BID   heparin injection (subcutaneous)  7,500 Units Subcutaneous Q8H   influenza vac split quadrivalent PF  0.5 mL Intramuscular Tomorrow-1000   insulin aspart  0-15 Units Subcutaneous TID AC & HS   loratadine  10 mg Oral Daily   pantoprazole (PROTONIX) IV  40 mg Intravenous QHS   umeclidinium-vilanterol  1 puff Inhalation Daily   Continuous Infusions:  sodium chloride     cefTRIAXone (ROCEPHIN)  IV 1 g (10/02/20 0908)   lactated ringers 125 mL/hr at 10/02/20 0537     LOS: 3 days    Time spent: 25 minutes    Sidney Ace, MD [facility] Pager [phone]  If 7PM-7AM, please contact night-coverage 10/02/2020, 10:45 AM

## 2020-10-02 NOTE — TOC Progression Note (Signed)
Transition of Care [facility]) - Progression Note    Patient Details  Name: [name] MRN: [id number] Date of Birth: [birth year]  Transition of Care [facility] CM/SW [place], RN Phone Number: 10/02/2020, 4:25 PM  Clinical Narrative:  Patient lives at home alone, and states a niece will stay after discharge due to the degree of feeling uncomfortable returning home after recent events.  Friends will transport to appointments and to the pharmacy.  Patient is able to take medications as prescribed.    Discussed disposition, and patient is aware that continued treatment will be required following medical discharge.    TOC contact information given, TOC to follow.     Expected Discharge Plan: Psychiatric Hospital Barriers to Discharge: Continued Medical Work up  Expected Discharge Plan and Services Expected Discharge Plan: [place] arrangements for the past 2 months: Single Family Home                                       Social Determinants of Health (SDOH) Interventions    Readmission Risk Interventions No flowsheet data found.

## 2020-10-02 NOTE — Consult Note (Signed)
[facility] Psychiatry Consult   Reason for Consult:  Follow up consult for 55 year old male, prefers [place] pronouns [name]") who overdosed on prescribed medications Referring Physician:  Priscella Mann  Patient Identification: [name] MRN:  [id number] Principal Diagnosis: Overdose Diagnosis:  Principal Problem:   Overdose Active Problems:   Obesity   Chronic low back pain (Bilateral) (R>L) w/ sciatica (Bilateral)   DM2 (diabetes mellitus, type 2) (HCC)   Venous stasis dermatitis of both lower extremities   Suicidal ideation   Acute on chronic respiratory failure with hypoxia (HCC)   AKI (acute kidney injury) ([place])   Acute encephalopathy   Lactic acidosis   Depression   Total Time spent with patient: 15 minutes  Subjective:  "I'm feeling great" [name] is a 55 y.o. adult patient admitted with overdose of tizanidine and losartan. Patient seen and chart reviewed. Patient remains calm, cooperative. Continues to deny suicidal ideations and states she does not want inpatient psychiatric treatment after she is medically stable. She states she is going home with maybe some home health, and her niece, sister, or nephew will be staying with her. Patient is quite adamant that she will not benefit from inpatient psychiatric care. Writer told patient that I will discuss with treatment team. Per Dr. Priscella Mann, patient has not had any unsafe behaviors to his knowledge, but this is the first day he has seen patient. Dr. Priscella Mann anticipates that patient may be cleared from a medical standpoint tomorrow (9/22) and may discharge.  According to patient's nurse, sitter is not indicated and patient has not displayed unsafe behavior.     Past Psychiatric History: See previous  Risk to Self:   Risk to Others:   Prior Inpatient Therapy:   Prior Outpatient Therapy:    Past Medical History:  Past Medical History:  Diagnosis Date   COPD (chronic obstructive pulmonary disease) ([place])     Coronary artery disease    Diabetes mellitus without complication ([place])    Endotracheally intubated 09/30/2020   Hyperlipidemia    Hypertension    On mechanically assisted ventilation ([place]) 09/30/2020   Peptic ulcer    Sleep apnea     Past Surgical History:  Procedure Laterality Date   COLONOSCOPY WITH PROPOFOL N/A 11/09/2016   Procedure: COLONOSCOPY WITH PROPOFOL;  Surgeon: Lollie Sails, MD;  Location: [facility] ENDOSCOPY;  Service: Endoscopy;  Laterality: N/A;   COLONOSCOPY WITH PROPOFOL N/A 07/06/2017   Procedure: COLONOSCOPY WITH PROPOFOL;  Surgeon: Lollie Sails, MD;  Location: [facility] ENDOSCOPY;  Service: Endoscopy;  Laterality: N/A;   ESOPHAGOGASTRODUODENOSCOPY (EGD) WITH PROPOFOL N/A 11/09/2016   Procedure: ESOPHAGOGASTRODUODENOSCOPY (EGD) WITH PROPOFOL;  Surgeon: Lollie Sails, MD;  Location: [facility] ENDOSCOPY;  Service: Endoscopy;  Laterality: N/A;   ESOPHAGOGASTRODUODENOSCOPY (EGD) WITH PROPOFOL N/A 07/06/2017   Procedure: ESOPHAGOGASTRODUODENOSCOPY (EGD) WITH PROPOFOL;  Surgeon: Lollie Sails, MD;  Location: [facility] ENDOSCOPY;  Service: Endoscopy;  Laterality: N/A;   Family History:  Family History  Problem Relation Age of Onset   Diabetes Mother    Diabetes Father    Stomach cancer Father    Heart attack Father    Alcohol abuse Father    Heart attack Sister    Heart attack Brother    Heart attack Paternal Aunt    Heart attack Paternal Uncle    Family Psychiatric  History: See previous Social History:  Social History   Substance and Sexual Activity  Alcohol Use No     Social History   Substance and Sexual  Activity  Drug Use No    Social History   Socioeconomic History   Marital status: Single    Spouse name: Not on file   Number of children: Not on file   Years of education: Not on file   Highest education level: Not on file  Occupational History   Not on file  Tobacco Use   Smoking status: Former    Packs/day: 0.75    Years: 30.00    Pack  years: 22.50    Types: Cigarettes    Quit date: 08/13/2019    Years since quitting: 1.1   Smokeless tobacco: Never   Tobacco comments:    trying decrease  Vaping Use   Vaping Use: Never used  Substance and Sexual Activity   Alcohol use: No   Drug use: No   Sexual activity: Not Currently  Other Topics Concern   Not on file  Social History Narrative   Not on file   Social Determinants of Health   Financial Resource Strain: Not on file  Food Insecurity: Not on file  Transportation Needs: Not on file  Physical Activity: Not on file  Stress: Not on file  Social Connections: Not on file   Additional Social History:    Allergies:   Allergies  Allergen Reactions   Shellfish Allergy Swelling   Codeine Rash   Lisinopril Cough    Labs:  Results for orders placed or performed during the hospital encounter of 09/29/20 (from the past 48 hour(s))  Glucose, capillary     Status: Abnormal   Collection Time: 09/30/20  7:41 PM  Result Value Ref Range   Glucose-Capillary 112 (H) 70 - 99 mg/dL    Comment: Glucose reference range applies only to samples taken after fasting for at least 8 hours.  Glucose, capillary     Status: Abnormal   Collection Time: 09/30/20 10:11 PM  Result Value Ref Range   Glucose-Capillary 111 (H) 70 - 99 mg/dL    Comment: Glucose reference range applies only to samples taken after fasting for at least 8 hours.  Procalcitonin     Status: None   Collection Time: 10/01/20  4:16 AM  Result Value Ref Range   Procalcitonin 0.17 ng/mL    Comment:        Interpretation: PCT (Procalcitonin) <= 0.5 ng/mL: Systemic infection (sepsis) is not likely. Local bacterial infection is possible. (NOTE)       Sepsis PCT Algorithm           Lower Respiratory Tract                                      Infection PCT Algorithm    ----------------------------     ----------------------------         PCT < 0.25 ng/mL                PCT < 0.10 ng/mL          Strongly encourage              Strongly discourage   discontinuation of antibiotics    initiation of antibiotics    ----------------------------     -----------------------------       PCT 0.25 - 0.50 ng/mL            PCT 0.10 - 0.25 ng/mL  OR       >80% decrease in PCT            Discourage initiation of                                            antibiotics      Encourage discontinuation           of antibiotics    ----------------------------     -----------------------------         PCT >= 0.50 ng/mL              PCT 0.26 - 0.50 ng/mL               AND        <80% decrease in PCT             Encourage initiation of                                             antibiotics       Encourage continuation           of antibiotics    ----------------------------     -----------------------------        PCT >= 0.50 ng/mL                  PCT > 0.50 ng/mL               AND         increase in PCT                  Strongly encourage                                      initiation of antibiotics    Strongly encourage escalation           of antibiotics                                     -----------------------------                                           PCT <= 0.25 ng/mL                                                 OR                                        > 80% decrease in PCT                                      Discontinue / Do not initiate  antibiotics  Performed at [facility], [address] [place], [place] [address]   Basic metabolic panel     Status: Abnormal   Collection Time: 10/01/20  4:16 AM  Result Value Ref Range   Sodium 138 135 - 145 mmol/L   Potassium 4.5 3.5 - 5.1 mmol/L   Chloride 111 98 - 111 mmol/L   CO2 23 22 - 32 mmol/L   Glucose, Bld 112 (H) 70 - 99 mg/dL    Comment: Glucose reference range applies only to samples taken after fasting for at least 8 hours.   BUN 29 (H) 6 - 20 mg/dL   Creatinine, Ser  3.83 (H) 0.61 - 1.24 mg/dL   Calcium 8.5 (L) 8.9 - 10.3 mg/dL   GFR, Estimated 18 (L) >60 mL/min    Comment: (NOTE) Calculated using the CKD-EPI Creatinine Equation (2021)    Anion gap 4 (L) 5 - 15    Comment: Performed at [facility], [address] [place], [place] [address]  Magnesium     Status: Abnormal   Collection Time: 10/01/20  4:16 AM  Result Value Ref Range   Magnesium 2.5 (H) 1.7 - 2.4 mg/dL    Comment: Performed at [facility], [address] [place], [place] [address]  Phosphorus     Status: None   Collection Time: 10/01/20  4:16 AM  Result Value Ref Range   Phosphorus 4.4 2.5 - 4.6 mg/dL    Comment: Performed at [facility], [address] [place], [place] [address]  CBC     Status: Abnormal   Collection Time: 10/01/20  4:16 AM  Result Value Ref Range   WBC 7.9 4.0 - 10.5 K/uL   RBC 4.30 4.22 - 5.81 MIL/uL   Hemoglobin 12.1 (L) 13.0 - 17.0 g/dL   HCT 38.1 (L) 39.0 - 52.0 %   MCV 88.6 80.0 - 100.0 fL   MCH 28.1 26.0 - 34.0 pg   MCHC 31.8 30.0 - 36.0 g/dL   RDW 13.4 11.5 - 15.5 %   Platelets 218 150 - 400 K/uL   nRBC 0.0 0.0 - 0.2 %    Comment: Performed at [facility], [address] [place],  [address]  Glucose, capillary     Status: Abnormal   Collection Time: 10/01/20  7:53 AM  Result Value Ref Range   Glucose-Capillary 114 (H) 70 - 99 mg/dL    Comment: Glucose reference range applies only to samples taken after fasting for at least 8 hours.  Glucose, capillary     Status: None   Collection Time: 10/01/20 11:14 AM  Result Value Ref Range   Glucose-Capillary 92 70 - 99 mg/dL    Comment: Glucose reference range applies only to samples taken after fasting for at least 8 hours.  Glucose, capillary     Status: Abnormal   Collection Time: 10/01/20  4:40 PM  Result Value Ref Range   Glucose-Capillary 114 (H) 70 - 99 mg/dL    Comment: Glucose reference range applies only to samples taken after fasting for at  least 8 hours.  Glucose, capillary     Status: Abnormal   Collection Time: 10/01/20  8:08 PM  Result Value Ref Range   Glucose-Capillary 128 (H) 70 - 99 mg/dL    Comment: Glucose reference range applies only to samples taken after fasting for at least 8 hours.  Basic metabolic panel     Status: Abnormal   Collection Time: 10/02/20  4:08  AM  Result Value Ref Range   Sodium 140 135 - 145 mmol/L   Potassium 4.6 3.5 - 5.1 mmol/L   Chloride 110 98 - 111 mmol/L   CO2 26 22 - 32 mmol/L   Glucose, Bld 99 70 - 99 mg/dL    Comment: Glucose reference range applies only to samples taken after fasting for at least 8 hours.   BUN 24 (H) 6 - 20 mg/dL   Creatinine, Ser 2.96 (H) 0.61 - 1.24 mg/dL   Calcium 9.1 8.9 - 10.3 mg/dL   GFR, Estimated 24 (L) >60 mL/min    Comment: (NOTE) Calculated using the CKD-EPI Creatinine Equation (2021)    Anion gap 4 (L) 5 - 15    Comment: Performed at [facility], [address] [place], [place] [address]  CBC     Status: Abnormal   Collection Time: 10/02/20  4:08 AM  Result Value Ref Range   WBC 5.9 4.0 - 10.5 K/uL   RBC 3.97 (L) 4.22 - 5.81 MIL/uL   Hemoglobin 11.5 (L) 13.0 - 17.0 g/dL   HCT 35.5 (L) 39.0 - 52.0 %   MCV 89.4 80.0 - 100.0 fL   MCH 29.0 26.0 - 34.0 pg   MCHC 32.4 30.0 - 36.0 g/dL   RDW 13.2 11.5 - 15.5 %   Platelets 197 150 - 400 K/uL   nRBC 0.0 0.0 - 0.2 %    Comment: Performed at [facility], [address] [place], [place] [address]  Glucose, capillary     Status: Abnormal   Collection Time: 10/02/20  7:30 AM  Result Value Ref Range   Glucose-Capillary 115 (H) 70 - 99 mg/dL    Comment: Glucose reference range applies only to samples taken after fasting for at least 8 hours.    Current Facility-Administered Medications  Medication Dose Route Frequency Provider Last Rate Last Admin   0.9 %  sodium chloride infusion  250 mL Intravenous Continuous Rust-Chester, Huel Cote, NP       acetaminophen (TYLENOL) tablet  650 mg  650 mg Oral Q6H PRN Ralene Muskrat B, MD   650 mg at 10/02/20 1717   albuterol (PROVENTIL) (2.5 MG/3ML) 0.083% nebulizer solution 3 mL  3 mL Nebulization Q4H PRN Milus Banister, NP       atorvastatin (LIPITOR) tablet 20 mg  20 mg Oral QHS Graves, Raeford Razor, NP   20 mg at 10/01/20 2152   butalbital-acetaminophen-caffeine (FIORICET) 50-325-40 MG per tablet 1 tablet  1 tablet Oral Q6H PRN Rust-Chester, Huel Cote, NP   1 tablet at 10/02/20 1135   cefTRIAXone (ROCEPHIN) 2 g in sodium chloride 0.9 % 100 mL IVPB  2 g Intravenous Q24H Sreenath, Sudheer B, MD       docusate sodium (COLACE) capsule 100 mg  100 mg Oral BID PRN Rust-Chester, Toribio Harbour L, NP       docusate sodium (COLACE) capsule 100 mg  100 mg Oral BID Milus Banister, NP   100 mg at 10/02/20 0852   heparin injection 7,500 Units  7,500 Units Subcutaneous Q8H Lu Duffel, RPH   7,500 Units at 10/02/20 1430   lactated ringers infusion   Intravenous Continuous Ralene Muskrat B, MD 75 mL/hr at 10/02/20 1726 Rate Change at 10/02/20 1726   loratadine (CLARITIN) tablet 10 mg  10 mg Oral Daily Milus Banister, NP   10 mg at 10/02/20 0852   pantoprazole (PROTONIX) injection 40 mg  40 mg Intravenous QHS Rust-Chester,  Huel Cote, NP   40 mg at 10/01/20 2150   polyethylene glycol (MIRALAX / GLYCOLAX) packet 17 g  17 g Oral Daily PRN Rust-Chester, Huel Cote, NP       simethicone (MYLICON) chewable tablet 80 mg  80 mg Oral Q6H PRN Graves, Raeford Razor, NP   80 mg at 10/02/20 1430   triamcinolone ointment (KENALOG) 0.1 %   Topical QHS PRN Graves, Raeford Razor, NP       umeclidinium-vilanterol (ANORO ELLIPTA) 62.5-25 MCG/INH 1 puff  1 puff Inhalation Daily Milus Banister, NP   1 puff at 10/02/20 0855    Musculoskeletal: Strength & Muscle Tone: decreased Gait & Station:  not assessed Patient leans: N/A            Psychiatric Specialty Exam:  Presentation  General Appearance: Appropriate for Environment  Eye Contact:Good  Speech:Clear  and Coherent  Speech Volume:Normal  Handedness: No data recorded  Mood and Affect  Mood:Euthymic  Affect:Appropriate   Thought Process  Thought Processes:Coherent  Descriptions of Associations:Intact  Orientation:Full (Time, Place and Person)  Thought Content:Logical  History of Schizophrenia/Schizoaffective disorder:No data recorded Duration of Psychotic Symptoms:No data recorded Hallucinations:Hallucinations: None Ideas of Reference:None  Suicidal Thoughts:Suicidal Thoughts: No Homicidal Thoughts:Homicidal Thoughts: No  Sensorium  Memory:Immediate Good; Recent Good  Judgment:Intact  Insight:Fair   Executive Functions  Concentration:Good  Attention Span:Good  [place] of Knowledge:Fair  Language:Fair   Psychomotor Activity  Psychomotor Activity: No data recorded  Assets  Assets:Desire for Improvement; Financial Resources/Insurance   Sleep  Sleep: Sleep: Fair  Physical Exam: Physical Exam Vitals and nursing note reviewed.  Constitutional:      Appearance: She is obese.  HENT:     Head: Normocephalic.     Nose: No congestion or rhinorrhea.  Cardiovascular:     Rate and Rhythm: Normal rate.  Pulmonary:     Comments: On O2 Musculoskeletal:     Cervical back: Normal range of motion.     Right lower leg: Edema present.     Left lower leg: Edema present.  Skin:    General: Skin is dry.  Neurological:     Mental Status: She is oriented to person, place, and time.   Review of Systems  Endo/Heme/Allergies:  Bruises/bleeds easily: Pt on medical unit for AKI and resp failure, s/p intentional overdose.  Blood pressure (!) 163/90, pulse 90, temperature 98.5 F (36.9 C), temperature source Oral, resp. rate 16, height 5\' 5"  (1.651 m), weight (!) 143.9 kg, SpO2 100 %. Body mass index is 52.79 kg/m.  Treatment Plan Summary: Daily contact with patient to assess and evaluate symptoms and progress in treatment  Disposition: Supportive  therapy provided about ongoing stressors.Will assess prior to medical discharged for possible admission to inpatient unit  Sherlon Handing, NP 10/02/2020 6:28 PM

## 2020-10-02 NOTE — Care Management Important Message (Signed)
Important Message  Patient Details  Name: [name] MRN: [id number] Date of Birth: 05/09/1965   Medicare Important Message Given:  N/A - LOS <3 / Initial given by admissions     Juliann Pulse A Vyolet Sakuma 10/02/2020, 8:26 AM

## 2020-10-03 DIAGNOSIS — F332 Major depressive disorder, recurrent severe without psychotic features: Secondary | ICD-10-CM

## 2020-10-03 DIAGNOSIS — T50902D Poisoning by unspecified drugs, medicaments and biological substances, intentional self-harm, subsequent encounter: Secondary | ICD-10-CM | POA: Diagnosis not present

## 2020-10-03 LAB — BASIC METABOLIC PANEL
Anion gap: 12 (ref 5–15)
BUN: 17 mg/dL (ref 6–20)
CO2: 23 mmol/L (ref 22–32)
Calcium: 9.7 mg/dL (ref 8.9–10.3)
Chloride: 102 mmol/L (ref 98–111)
Creatinine, Ser: 2.1 mg/dL — ABNORMAL HIGH (ref 0.61–1.24)
GFR, Estimated: 36 mL/min — ABNORMAL LOW (ref >60)
Glucose, Bld: 105 mg/dL — ABNORMAL HIGH (ref 70–99)
Potassium: 3.8 mmol/L (ref 3.5–5.1)
Sodium: 137 mmol/L (ref 135–145)

## 2020-10-03 NOTE — Consult Note (Signed)
[facility] Face-to-Face Psychiatry Consult   Reason for Consult: Consult follow-up with 55 year old transgender male who came into the hospital after a suicidal overdose Referring Physician:  Priscella Mann Patient Identification: [name] MRN:  [id number] Principal Diagnosis: Overdose Diagnosis:  Principal Problem:   Overdose Active Problems:   Obesity   Chronic low back pain (Bilateral) (R>L) w/ sciatica (Bilateral)   DM2 (diabetes mellitus, type 2) (HCC)   Venous stasis dermatitis of both lower extremities   Suicidal ideation   Acute on chronic respiratory failure with hypoxia (HCC)   AKI (acute kidney injury) ([place])   Acute encephalopathy   Lactic acidosis   Depression   Total Time spent with patient: 30 minutes  Subjective:   [name] is a 55 y.o. adult patient admitted with "I will never do that again".  HPI: See previous notes.  55 year old transgender male with chronic depression came to the hospital after an overdose on multiple medications and reported suicidal ideation.  Patient has been in the hospital for several days recovering.  She reports that if for the last couple days at least she has had no suicidal thoughts at all.  Patient presents with a upbeat affect lucid speech and he is clearly focused on the future with an appropriate safety plan and plan for outpatient treatment from the hospital.  Totally denies suicidal ideation.  Agrees to diagnosis of depression and outpatient plan  Past Psychiatric History: Past history of depression and chronic depression and anxiety.  1 prior overdose.  Risk to Self:   Risk to Others:   Prior Inpatient Therapy:   Prior Outpatient Therapy:    Past Medical History:  Past Medical History:  Diagnosis Date   COPD (chronic obstructive pulmonary disease) ([place])    Coronary artery disease    Diabetes mellitus without complication ([place])    Endotracheally intubated 09/30/2020   Hyperlipidemia    Hypertension    On  mechanically assisted ventilation ([place]) 09/30/2020   Peptic ulcer    Sleep apnea     Past Surgical History:  Procedure Laterality Date   COLONOSCOPY WITH PROPOFOL N/A 11/09/2016   Procedure: COLONOSCOPY WITH PROPOFOL;  Surgeon: Lollie Sails, MD;  Location: [facility] ENDOSCOPY;  Service: Endoscopy;  Laterality: N/A;   COLONOSCOPY WITH PROPOFOL N/A 07/06/2017   Procedure: COLONOSCOPY WITH PROPOFOL;  Surgeon: Lollie Sails, MD;  Location: [facility] ENDOSCOPY;  Service: Endoscopy;  Laterality: N/A;   ESOPHAGOGASTRODUODENOSCOPY (EGD) WITH PROPOFOL N/A 11/09/2016   Procedure: ESOPHAGOGASTRODUODENOSCOPY (EGD) WITH PROPOFOL;  Surgeon: Lollie Sails, MD;  Location: [facility] ENDOSCOPY;  Service: Endoscopy;  Laterality: N/A;   ESOPHAGOGASTRODUODENOSCOPY (EGD) WITH PROPOFOL N/A 07/06/2017   Procedure: ESOPHAGOGASTRODUODENOSCOPY (EGD) WITH PROPOFOL;  Surgeon: Lollie Sails, MD;  Location: [facility] ENDOSCOPY;  Service: Endoscopy;  Laterality: N/A;   Family History:  Family History  Problem Relation Age of Onset   Diabetes Mother    Diabetes Father    Stomach cancer Father    Heart attack Father    Alcohol abuse Father    Heart attack Sister    Heart attack Brother    Heart attack Paternal Aunt    Heart attack Paternal Uncle    Family Psychiatric  History: See previous Social History:  Social History   Substance and Sexual Activity  Alcohol Use No     Social History   Substance and Sexual Activity  Drug Use No    Social History   Socioeconomic History   Marital status: Single    Spouse  name: Not on file   Number of children: Not on file   Years of education: Not on file   Highest education level: Not on file  Occupational History   Not on file  Tobacco Use   Smoking status: Former    Packs/day: 0.75    Years: 30.00    Pack years: 22.50    Types: Cigarettes    Quit date: 08/13/2019    Years since quitting: 1.1   Smokeless tobacco: Never   Tobacco comments:    trying decrease   Vaping Use   Vaping Use: Never used  Substance and Sexual Activity   Alcohol use: No   Drug use: No   Sexual activity: Not Currently  Other Topics Concern   Not on file  Social History Narrative   Not on file   Social Determinants of Health   Financial Resource Strain: Not on file  Food Insecurity: Not on file  Transportation Needs: Not on file  Physical Activity: Not on file  Stress: Not on file  Social Connections: Not on file   Additional Social History:    Allergies:   Allergies  Allergen Reactions   Shellfish Allergy Swelling   Codeine Rash   Lisinopril Cough    Labs:  Results for orders placed or performed during the hospital encounter of 09/29/20 (from the past 48 hour(s))  Glucose, capillary     Status: Abnormal   Collection Time: 10/01/20  4:40 PM  Result Value Ref Range   Glucose-Capillary 114 (H) 70 - 99 mg/dL    Comment: Glucose reference range applies only to samples taken after fasting for at least 8 hours.  Glucose, capillary     Status: Abnormal   Collection Time: 10/01/20  8:08 PM  Result Value Ref Range   Glucose-Capillary 128 (H) 70 - 99 mg/dL    Comment: Glucose reference range applies only to samples taken after fasting for at least 8 hours.  Basic metabolic panel     Status: Abnormal   Collection Time: 10/02/20  4:08 AM  Result Value Ref Range   Sodium 140 135 - 145 mmol/L   Potassium 4.6 3.5 - 5.1 mmol/L   Chloride 110 98 - 111 mmol/L   CO2 26 22 - 32 mmol/L   Glucose, Bld 99 70 - 99 mg/dL    Comment: Glucose reference range applies only to samples taken after fasting for at least 8 hours.   BUN 24 (H) 6 - 20 mg/dL   Creatinine, Ser 2.96 (H) 0.61 - 1.24 mg/dL   Calcium 9.1 8.9 - 10.3 mg/dL   GFR, Estimated 24 (L) >60 mL/min    Comment: (NOTE) Calculated using the CKD-EPI Creatinine Equation (2021)    Anion gap 4 (L) 5 - 15    Comment: Performed at [facility], [address] [place], [place] [address]  CBC     Status:  Abnormal   Collection Time: 10/02/20  4:08 AM  Result Value Ref Range   WBC 5.9 4.0 - 10.5 K/uL   RBC 3.97 (L) 4.22 - 5.81 MIL/uL   Hemoglobin 11.5 (L) 13.0 - 17.0 g/dL   HCT 35.5 (L) 39.0 - 52.0 %   MCV 89.4 80.0 - 100.0 fL   MCH 29.0 26.0 - 34.0 pg   MCHC 32.4 30.0 - 36.0 g/dL   RDW 13.2 11.5 - 15.5 %   Platelets 197 150 - 400 K/uL   nRBC 0.0 0.0 - 0.2 %    Comment: Performed  at [facility], [address] [place], [place] [address]  Glucose, capillary     Status: Abnormal   Collection Time: 10/02/20  7:30 AM  Result Value Ref Range   Glucose-Capillary 115 (H) 70 - 99 mg/dL    Comment: Glucose reference range applies only to samples taken after fasting for at least 8 hours.  Basic metabolic panel     Status: Abnormal   Collection Time: 10/03/20  8:01 AM  Result Value Ref Range   Sodium 137 135 - 145 mmol/L   Potassium 3.8 3.5 - 5.1 mmol/L   Chloride 102 98 - 111 mmol/L   CO2 23 22 - 32 mmol/L   Glucose, Bld 105 (H) 70 - 99 mg/dL    Comment: Glucose reference range applies only to samples taken after fasting for at least 8 hours.   BUN 17 6 - 20 mg/dL   Creatinine, Ser 2.10 (H) 0.61 - 1.24 mg/dL   Calcium 9.7 8.9 - 10.3 mg/dL   GFR, Estimated 36 (L) >60 mL/min    Comment: (NOTE) Calculated using the CKD-EPI Creatinine Equation (2021)    Anion gap 12 5 - 15    Comment: Performed at [facility], [address] [place], [place] [address]    Current Facility-Administered Medications  Medication Dose Route Frequency Provider Last Rate Last Admin   0.9 %  sodium chloride infusion  250 mL Intravenous Continuous Rust-Chester, Huel Cote, NP       acetaminophen (TYLENOL) tablet 650 mg  650 mg Oral Q6H PRN Ralene Muskrat B, MD   650 mg at 10/02/20 1717   albuterol (PROVENTIL) (2.5 MG/3ML) 0.083% nebulizer solution 3 mL  3 mL Nebulization Q4H PRN Milus Banister, NP       atorvastatin (LIPITOR) tablet 20 mg  20 mg Oral QHS Graves, Raeford Razor, NP   20 mg at  10/02/20 2130   butalbital-acetaminophen-caffeine (FIORICET) 50-325-40 MG per tablet 1 tablet  1 tablet Oral Q6H PRN Rust-Chester, Huel Cote, NP   1 tablet at 10/03/20 1117   cefTRIAXone (ROCEPHIN) 2 g in sodium chloride 0.9 % 100 mL IVPB  2 g Intravenous Q24H Sreenath, Sudheer B, MD 200 mL/hr at 10/02/20 2142 2 g at 10/02/20 2142   docusate sodium (COLACE) capsule 100 mg  100 mg Oral BID PRN Rust-Chester, Toribio Harbour L, NP       docusate sodium (COLACE) capsule 100 mg  100 mg Oral BID Milus Banister, NP   100 mg at 10/03/20 0909   heparin injection 7,500 Units  7,500 Units Subcutaneous Q8H Lu Duffel, RPH   7,500 Units at 10/03/20 0540   loratadine (CLARITIN) tablet 10 mg  10 mg Oral Daily Milus Banister, NP   10 mg at 10/03/20 0909   pantoprazole (PROTONIX) injection 40 mg  40 mg Intravenous QHS Rust-Chester, Britton L, NP   40 mg at 10/02/20 2130   polyethylene glycol (MIRALAX / GLYCOLAX) packet 17 g  17 g Oral Daily PRN Rust-Chester, Huel Cote, NP       simethicone (MYLICON) chewable tablet 80 mg  80 mg Oral Q6H PRN Graves, Raeford Razor, NP   80 mg at 10/02/20 1430   triamcinolone ointment (KENALOG) 0.1 %   Topical QHS PRN Graves, Raeford Razor, NP       umeclidinium-vilanterol (ANORO ELLIPTA) 62.5-25 MCG/INH 1 puff  1 puff Inhalation Daily Milus Banister, NP   1 puff at 10/03/20 0908    Musculoskeletal: Strength &  Muscle Tone: within normal limits Gait & Station: normal Patient leans: N/A            Psychiatric Specialty Exam:  Presentation  General Appearance: Appropriate for Environment  Eye Contact:Good  Speech:Clear and Coherent  Speech Volume:Normal  Handedness: No data recorded  Mood and Affect  Mood:Euthymic  Affect:Appropriate   Thought Process  Thought Processes:Coherent  Descriptions of Associations:Intact  Orientation:Full (Time, Place and Person)  Thought Content:Logical  History of Schizophrenia/Schizoaffective disorder:No data recorded Duration  of Psychotic Symptoms:No data recorded Hallucinations:Hallucinations: None  Ideas of Reference:None  Suicidal Thoughts:Suicidal Thoughts: No  Homicidal Thoughts:Homicidal Thoughts: No   Sensorium  Memory:Immediate Good; Recent Good  Judgment:Intact  Insight:Fair   Executive Functions  Concentration:Good  Attention Span:Good  [place] of Knowledge:Fair  Language:Fair   Psychomotor Activity  Psychomotor Activity: No data recorded  Assets  Assets:Desire for Improvement; Financial Resources/Insurance   Sleep  Sleep:Sleep: Fair   Physical Exam: Physical Exam Constitutional:      Appearance: Normal appearance.  HENT:     Head: Normocephalic and atraumatic.     Mouth/Throat:     Pharynx: Oropharynx is clear.  Eyes:     Pupils: Pupils are equal, round, and reactive to light.  Cardiovascular:     Rate and Rhythm: Normal rate and regular rhythm.  Pulmonary:     Effort: Pulmonary effort is normal.     Breath sounds: Normal breath sounds.  Abdominal:     General: Abdomen is flat.     Palpations: Abdomen is soft.  Musculoskeletal:        General: Normal range of motion.  Skin:    General: Skin is warm and dry.  Neurological:     General: No focal deficit present.     Mental Status: She is alert. Mental status is at baseline.  Psychiatric:        Mood and Affect: Mood normal.        Thought Content: Thought content normal.   Review of Systems  Constitutional: Negative.   HENT: Negative.    Eyes: Negative.   Respiratory: Negative.    Cardiovascular: Negative.   Gastrointestinal: Negative.   Musculoskeletal: Negative.   Skin: Negative.   Neurological: Negative.   Psychiatric/Behavioral: Negative.    Blood pressure (!) 159/82, pulse 100, temperature 98.7 F (37.1 C), temperature source Oral, resp. rate 16, height 5\' 5"  (1.651 m), weight (!) 143.9 kg, SpO2 92 %. Body mass index is 52.79 kg/m.  Treatment Plan Summary: Plan patient is  requesting to be discharged from the hospital.  After review of the last couple days of her insisting she has no suicidal ideation and meeting with the patient today I think that she no longer meets commitment criteria and is appropriate for choosing outpatient treatment.  She has been recommended because of her insurance to follow up with [facility].  Continue current medicines as prescribed.  Emphasized with the patient the need to have a safety plan and to contact others if suicidal ideation recurs and she agrees.  Disposition: Patient does not meet criteria for psychiatric inpatient admission.  Alethia Berthold, MD 10/03/2020 12:25 PM

## 2020-10-03 NOTE — Progress Notes (Signed)
Gave patient discharge packet and instructions. Gathered items for D/C and patient was taken via wheelchair to car. IV removed to left AC.

## 2020-10-03 NOTE — Discharge Summary (Signed)
Physician Discharge Summary  [name] OZD:[id number] DOB: 08/18/1965 DOA: 09/29/2020  PCP: Theotis Burrow, MD  Admit date: 09/29/2020 Discharge date: 10/03/2020  Admitted From: Home Disposition: Home  Recommendations for Outpatient Follow-up:  Follow up with PCP in 1-2 weeks Follow-up outpatient psychiatry  Home Health: No Equipment/Devices: None  Discharge Condition: Stable CODE STATUS: Full Diet recommendation: Heart Healthy / Carb Modified   Brief/Interim Summary: 55 y.o. adult with past medical history significant for COPD, CAD, type II DM, hypertension, hyperlipidemia, obstructive sleep apnea, peptic ulcer disease.  He was brought to the hospital because of unresponsiveness.  He was found on the floor for an unknown amount of time.  2 empty bottles of losartan and Zanaflex were found next to him on the floor.  Reportedly, his friend performed a wellness check on the patient after seeing a post on social media where the patient had made comments alluding to suicide.  Patient's nephew was murdered this year and recently a friend of the patient who had been staying with him committed suicide and the patient was positive.  Since then, patient has been struggling with depression, anxiety and agoraphobia.   He was admitted to the hospital for intentional drug overdose, suicide attempt, acute toxic encephalopathy, AKI and acute hypoxemic respiratory failure.  He was intubated and placed on mechanical ventilation for airway protection and acute respiratory failure.    Successfully extubated and transitioned to hospitalist service on 9/20.  Remains hypoxic requiring 3 to 4 L nasal cannula.  Date of discharge 9/22 patient was weaned from supplemental oxygen.  Kidney function is recovering but not yet at baseline.  Stable for discharge at this time.  Discussed with psychiatry.  No clear indication for inpatient psychiatric unit admission.  Patient will follow-up with outpatient  psychiatry.  He is advised to hold his losartan for at least a period of 1 week prior to him restarting.  He relayed to me that he has been taken off of his blood pressure medications as his blood pressure is improved once he stopped smoking.  Of note patient prefers pronouns she/her/hers.  My apologies for incorrect documentation in the note.   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  Suspect post intubation atelectasis.  Recovered to room air with incentive spirometry use.   Intentional drug overdose/suicide attempt Depression/anxiety Agoraphobia Psychiatry follow-up appreciated IVC listed at time of discharge.  Discussed with psychiatry.  No indication for BHU placement.  We will follow-up with outpatient psychiatry   Acute kidney injury Creatinine peaked at 3.8, now downtrending Was on IV fluids.  Discontinue on the day of discharge.  Creatinine improved to 2.0.  Not quite at baseline.  Advised to hold losartan until seen by PCP.   Suspected aspiration pneumonia Completed IV antibiotics in house.  No antibiotics on discharge   COPD Compensated, not acutely exacerbated Continue bronchodilator therapy   Acute toxic encephalopathy Resolved, mental status baseline   Hypertension Home losartan held secondary to AKI As needed hydralazine  Discharge Instructions  Discharge Instructions  Diet - low sodium heart healthy   Complete by: As directed    Increase activity slowly   Complete by: As directed    No wound care    Complete by: As directed       Allergies as of 10/03/2020       Reactions   Shellfish Allergy Swelling   Codeine Rash   Lisinopril Cough        Medication List     STOP taking these medications    fluconazole 200 MG tablet Commonly known as: DIFLUCAN   losartan 100 MG tablet Commonly known as: COZAAR   losartan 50 MG tablet Commonly known as: COZAAR   Magnesium 500 MG Caps   phentermine 37.5 MG capsule   Potassium Gluconate 595 MG Caps   Urea 35 % Crea   Urea 40 % Lotn   Victoza 18 MG/3ML Sopn Generic drug: liraglutide   Vitamin D 125 MCG (5000 UT) Caps   vitamin E 180 MG (400 UNITS) capsule       TAKE these medications    albuterol 108 (90 Base) MCG/ACT inhaler Commonly known as: VENTOLIN HFA Inhale 2 puffs into the lungs every 4 (four) hours as needed for wheezing or shortness of breath.   Anoro Ellipta 62.5-25 MCG/INH Aepb Generic drug: umeclidinium-vilanterol Inhale into the lungs.   ARIPiprazole 2 MG tablet Commonly known as: Abilify Take 1 tablet (2 mg total) by mouth daily.   aspirin-acetaminophen-caffeine 094-709-62 MG tablet Commonly known as: EXCEDRIN MIGRAINE Take by mouth every 6 (six) hours as needed for headache.   atorvastatin 20 MG tablet Commonly known as: LIPITOR Take 20 mg by mouth at bedtime.   buPROPion 300 MG 24 hr tablet Commonly known as: WELLBUTRIN XL Take 1 tablet (300 mg total) by mouth daily.   CALCIUM 1200+D3 PO Take by mouth.   clindamycin 1 % external solution Commonly known as: CLEOCIN T APPLY TO AFFECTED AREA RASH ON BUTTOCKS1 TIME A WEEK ON SUNDAYS   escitalopram 20 MG tablet Commonly known as: LEXAPRO Take 20 mg by mouth daily.   estradiol 2 MG tablet Commonly known as: ESTRACE Take by mouth.   gabapentin 300 MG capsule Commonly known as: NEURONTIN Take 300 mg by mouth 3 (three) times daily.   hydrochlorothiazide 12.5 MG capsule Commonly known as: MICROZIDE Take 12.5 mg by mouth daily.    hydrocortisone 2.5 % lotion APPLY TO AFFECTEDAREA RASH ON BUTTOCKS 3 DAYS PER WEEK. TUESDAY, THURSDAY, SATURDAY, AND AS NEEDED FOR FLARES   Ipratropium-Albuterol 20-100 MCG/ACT Aers respimat Commonly known as: COMBIVENT Inhale into the lungs.   ketoconazole 2 % cream Commonly known as: NIZORAL Apply to a rash on buttock 3 days a week. On Monday, Wednesday and Friday   loratadine 10 MG tablet Commonly known as: CLARITIN Take 10 mg by mouth daily.   meloxicam 15 MG tablet Commonly known as: MOBIC Take 1 tablet (15 mg total) by mouth daily.   metFORMIN 1000 MG tablet Commonly known as: GLUCOPHAGE Take 1,000 mg by mouth 2 (two) times daily with a meal.   oxybutynin 5 MG tablet Commonly known as: DITROPAN Take 5 mg by mouth every 12 (twelve) hours.   Ozempic (0.25 or 0.5 MG/DOSE) 2 MG/1.5ML Sopn Generic drug: Semaglutide(0.25 or 0.5MG /DOS) Inject 0.5 mg into the skin once a week.   pantoprazole 40 MG tablet Commonly known as: PROTONIX Take 40 mg by mouth 2 (two) times daily.   simethicone 125 MG chewable tablet Commonly known as: Berneta Sages  125 mg by mouth every 6 (six) hours as needed for flatulence.   spironolactone 50 MG tablet Commonly known as: ALDACTONE Take 50 mg by mouth 2 (two) times daily.   tiZANidine 4 MG tablet Commonly known as: Zanaflex Take 1 tablet (4 mg total) by mouth every 8 (eight) hours as needed for muscle spasms.   Trelegy Ellipta 100-62.5-25 MCG/INH Aepb Generic drug: Fluticasone-Umeclidin-Vilant Inhale 1 puff into the lungs daily.   triamcinolone ointment 0.1 % Commonly known as: KENALOG APPLY AT BEDTIME EVERYDAY TO FEET AND LOWER LEGS. AVOID FACE,GROIN, AND AXILLA   TRUEplus 5-Bevel Pen Needles 31G X 6 MM Misc Generic drug: Insulin Pen Needle        Follow-up Information     Revelo, Elyse Jarvis, MD. Schedule an appointment as soon as possible for a visit in 1 week(s).   Specialty: Family Medicine Contact information: [place] [address] [phone]                Allergies  Allergen Reactions   Shellfish Allergy Swelling   Codeine Rash   Lisinopril Cough    Consultations: Psychiatry   Procedures/Studies: DG Abdomen 1 View  Result Date: 09/29/2020 CLINICAL DATA:  OG tube placement EXAM: ABDOMEN - 1 VIEW COMPARISON:  None. FINDINGS: Enteric tube tip is in the left upper quadrant consistent with location in the upper stomach. IMPRESSION: Enteric tube tip is in the left upper quadrant consistent with location in the upper stomach. Electronically Signed   By: Lucienne Capers M.D.   On: 09/29/2020 19:46   CT HEAD WO CONTRAST (5MM)  Result Date: 09/29/2020 CLINICAL DATA:  Encephalopathy EXAM: CT HEAD WITHOUT CONTRAST TECHNIQUE: Contiguous axial images were obtained from the base of the skull through the vertex without intravenous contrast. COMPARISON:  None. FINDINGS: Brain: There is no mass, hemorrhage or extra-axial collection. The size and configuration of the ventricles and extra-axial CSF spaces are normal. The brain parenchyma is normal, without acute or chronic infarction. Vascular: No abnormal hyperdensity of the major intracranial arteries or dural venous sinuses. No intracranial atherosclerosis. Skull: The visualized skull base, calvarium and extracranial soft tissues are normal. Sinuses/Orbits: No fluid levels or advanced mucosal thickening of the visualized paranasal sinuses. No mastoid or middle ear effusion. The orbits are normal. IMPRESSION: Normal head CT. Electronically Signed   By: Ulyses Jarred M.D.   On: 09/29/2020 20:37   DG Chest Portable 1 View  Result Date: 09/29/2020 CLINICAL DATA:  Endotracheal tube and OG tube placements EXAM: PORTABLE CHEST 1 VIEW COMPARISON:  07/22/2020 FINDINGS: Endotracheal tube was placed with tip measuring 5.2 cm above the carina. Enteric tube tip is not visualized off the field of view. Shallow inspiration. Cardiac enlargement. No  vascular congestion, edema, or consolidation. Blunting of the costophrenic angles suggests small effusions. No pneumothorax. IMPRESSION: Appliances appear in satisfactory position. Cardiac enlargement. Small effusions. Electronically Signed   By: Lucienne Capers M.D.   On: 09/29/2020 19:46   (Echo, Carotid, EGD, Colonoscopy, ERCP)    Subjective: Patient seen and examined on the day of discharge.  Stable no distress.  Feels well.  No pain complaints.  Stable for discharge home.  IVC lifted by psychiatry at time of discharge.  Discharge Exam: Vitals:   10/03/20 0601 10/03/20 1230  BP: (!) 159/82 135/79  Pulse: 100 86  Resp: 16 18  Temp: 98.7 F (37.1 C) 98 F (36.7 C)  SpO2: 92% 99%   Vitals:   10/02/20 1549  10/03/20 0001 10/03/20 0601 10/03/20 1230  BP: (!) 163/90 (!) 160/90 (!) 159/82 135/79  Pulse: 90 94 100 86  Resp: 16 18 16 18   Temp: 98.5 F (36.9 C) 98 F (36.7 C) 98.7 F (37.1 C) 98 F (36.7 C)  TempSrc: Oral Oral Oral   SpO2: 100% 98% 92% 99%  Weight:      Height:        General: Pt is alert, awake, not in acute distress Cardiovascular: RRR, S1/S2 +, no rubs, no gallops Respiratory: CTA bilaterally, no wheezing, no rhonchi Abdominal: Soft, NT, ND, bowel sounds + Extremities: no edema, no cyanosis    The results of significant diagnostics from this hospitalization (including imaging, microbiology, ancillary and laboratory) are listed below for reference.     Microbiology: Recent Results (from the past 240 hour(s))  Blood culture (routine single)     Status: None (Preliminary result)   Collection Time: 09/29/20  7:14 PM   Specimen: BLOOD  Result Value Ref Range Status   Specimen Description BLOOD BLOOD LEFT FOREARM  Final   Special Requests   Final    BOTTLES DRAWN AEROBIC AND ANAEROBIC Blood Culture adequate volume   Culture   Final    NO GROWTH 4 DAYS Performed at [facility], [address], [place], [place] [address]    Report Status  PENDING  Incomplete  Resp Panel by RT-PCR (Flu A&B, Covid)     Status: None   Collection Time: 09/29/20  7:14 PM   Specimen: Nasopharyngeal(NP) swabs in vial transport medium  Result Value Ref Range Status   SARS Coronavirus 2 by RT PCR NEGATIVE NEGATIVE Final    Comment: (NOTE) SARS-CoV-2 target nucleic acids are NOT DETECTED.  The SARS-CoV-2 RNA is generally detectable in upper respiratory specimens during the acute phase of infection. The lowest concentration of SARS-CoV-2 viral copies this assay can detect is 138 copies/mL. A negative result does not preclude SARS-Cov-2 infection and should not be used as the sole basis for treatment or other patient management decisions. A negative result may occur with  improper specimen collection/handling, submission of specimen other than nasopharyngeal swab, presence of viral mutation(s) within the areas targeted by this assay, and inadequate number of viral copies(<138 copies/mL). A negative result must be combined with clinical observations, patient history, and epidemiological information. The expected result is Negative.  Fact Sheet for Patients:  EntrepreneurPulse.com.au  Fact Sheet for Healthcare Providers:  IncredibleEmployment.be  This test is no t yet approved or cleared by the [place] FDA and  has been authorized for detection and/or diagnosis of SARS-CoV-2 by FDA under an Emergency Use Authorization (EUA). This EUA will remain  in effect (meaning this test can be used) for the duration of the COVID-19 declaration under Section 564(b)(1) of the Act, 21 U.S.C.section 360bbb-3(b)(1), unless the authorization is terminated  or revoked sooner.       Influenza A by PCR NEGATIVE NEGATIVE Final   Influenza B by PCR NEGATIVE NEGATIVE Final    Comment: (NOTE) The Xpert Xpress SARS-CoV-2/FLU/RSV plus assay is intended as an aid in the diagnosis of influenza from Nasopharyngeal swab specimens  and should not be used as a sole basis for treatment. Nasal washings and aspirates are unacceptable for Xpert Xpress SARS-CoV-2/FLU/RSV testing.  Fact Sheet for Patients: EntrepreneurPulse.com.au  Fact Sheet for Healthcare Providers: IncredibleEmployment.be  This test is not yet approved or cleared by the [place] FDA and has been authorized for detection and/or diagnosis of SARS-CoV-2 by  FDA under an Emergency Use Authorization (EUA). This EUA will remain in effect (meaning this test can be used) for the duration of the COVID-19 declaration under Section 564(b)(1) of the Act, 21 U.S.C. section 360bbb-3(b)(1), unless the authorization is terminated or revoked.  Performed at [facility], [address] [place], [place] [address]   MRSA Next Gen by PCR, Nasal     Status: None   Collection Time: 09/30/20 12:10 AM   Specimen: Nasal Mucosa; Nasal Swab  Result Value Ref Range Status   MRSA by PCR Next Gen NOT DETECTED NOT DETECTED Final    Comment: (NOTE) The GeneXpert MRSA Assay (FDA approved for NASAL specimens only), is one component of a comprehensive MRSA colonization surveillance program. It is not intended to diagnose MRSA infection nor to guide or monitor treatment for MRSA infections. Test performance is not FDA approved in patients less than 42 years old. Performed at [facility], [address] [place], [place] [address]      Labs: BNP (last 3 results) No results for input(s): BNP in the last 8760 hours. Basic Metabolic Panel: Recent Labs  Lab 09/29/20 1914 09/30/20 0546 10/01/20 0416 10/02/20 0408 10/03/20 0801  NA 135 134* 138 140 137  K 3.8 4.4 4.5 4.6 3.8  CL 106 108 111 110 102  CO2 23 19* 23 26 23   GLUCOSE 175* 122* 112* 99 105*  BUN 18 22* 29* 24* 17  CREATININE 1.31* 2.10* 3.83* 2.96* 2.10*  CALCIUM 8.9 8.2* 8.5* 9.1 9.7  MG 1.9 1.8 2.5*  --   --   PHOS 2.8 4.3 4.4  --   --     Liver Function Tests: Recent Labs  Lab 09/29/20 1914 09/30/20 0546  AST 22 18  ALT 14 14  ALKPHOS 50 48  BILITOT 0.7 0.6  PROT 5.8* 5.8*  ALBUMIN 3.1* 3.2*   No results for input(s): LIPASE, AMYLASE in the last 168 hours. No results for input(s): AMMONIA in the last 168 hours. CBC: Recent Labs  Lab 09/29/20 1914 09/30/20 0546 10/01/20 0416 10/02/20 0408  WBC 9.7 13.9* 7.9 5.9  NEUTROABS 7.5  --   --   --   HGB 12.1* 12.6* 12.1* 11.5*  HCT 37.1* 38.5* 38.1* 35.5*  MCV 86.9 89.3 88.6 89.4  PLT 226 311 218 197   Cardiac Enzymes: No results for input(s): CKTOTAL, CKMB, CKMBINDEX, TROPONINI in the last 168 hours. BNP: Invalid input(s): POCBNP CBG: Recent Labs  Lab 10/01/20 0753 10/01/20 1114 10/01/20 1640 10/01/20 2008 10/02/20 0730  GLUCAP 114* 92 114* 128* 115*   D-Dimer No results for input(s): DDIMER in the last 72 hours. Hgb A1c No results for input(s): HGBA1C in the last 72 hours. Lipid Profile No results for input(s): CHOL, HDL, LDLCALC, TRIG, CHOLHDL, LDLDIRECT in the last 72 hours. Thyroid function studies No results for input(s): TSH, T4TOTAL, T3FREE, THYROIDAB in the last 72 hours.  Invalid input(s): FREET3 Anemia work up No results for input(s): VITAMINB12, FOLATE, FERRITIN, TIBC, IRON, RETICCTPCT in the last 72 hours. Urinalysis    Component Value Date/Time   COLORURINE YELLOW 09/29/2020 1914   APPEARANCEUR CLEAR 09/29/2020 1914   LABSPEC >1.030 (H) 09/29/2020 1914   PHURINE 5.5 09/29/2020 1914   GLUCOSEU NEGATIVE 09/29/2020 1914   HGBUR TRACE (A) 09/29/2020 1914   BILIRUBINUR NEGATIVE 09/29/2020 1914   KETONESUR NEGATIVE 09/29/2020 1914   PROTEINUR 100 (A) 09/29/2020 1914   NITRITE NEGATIVE 09/29/2020 1914   LEUKOCYTESUR NEGATIVE 09/29/2020 1914  Sepsis Labs Invalid input(s): PROCALCITONIN,  WBC,  LACTICIDVEN Microbiology Recent Results (from the past 240 hour(s))  Blood culture (routine single)     Status: None (Preliminary  result)   Collection Time: 09/29/20  7:14 PM   Specimen: BLOOD  Result Value Ref Range Status   Specimen Description BLOOD BLOOD LEFT FOREARM  Final   Special Requests   Final    BOTTLES DRAWN AEROBIC AND ANAEROBIC Blood Culture adequate volume   Culture   Final    NO GROWTH 4 DAYS Performed at [facility], [address], [place], [place] [address]    Report Status PENDING  Incomplete  Resp Panel by RT-PCR (Flu A&B, Covid)     Status: None   Collection Time: 09/29/20  7:14 PM   Specimen: Nasopharyngeal(NP) swabs in vial transport medium  Result Value Ref Range Status   SARS Coronavirus 2 by RT PCR NEGATIVE NEGATIVE Final    Comment: (NOTE) SARS-CoV-2 target nucleic acids are NOT DETECTED.  The SARS-CoV-2 RNA is generally detectable in upper respiratory specimens during the acute phase of infection. The lowest concentration of SARS-CoV-2 viral copies this assay can detect is 138 copies/mL. A negative result does not preclude SARS-Cov-2 infection and should not be used as the sole basis for treatment or other patient management decisions. A negative result may occur with  improper specimen collection/handling, submission of specimen other than nasopharyngeal swab, presence of viral mutation(s) within the areas targeted by this assay, and inadequate number of viral copies(<138 copies/mL). A negative result must be combined with clinical observations, patient history, and epidemiological information. The expected result is Negative.  Fact Sheet for Patients:  EntrepreneurPulse.com.au  Fact Sheet for Healthcare Providers:  IncredibleEmployment.be  This test is no t yet approved or cleared by the [place] FDA and  has been authorized for detection and/or diagnosis of SARS-CoV-2 by FDA under an Emergency Use Authorization (EUA). This EUA will remain  in effect (meaning this test can be used) for the duration of the COVID-19  declaration under Section 564(b)(1) of the Act, 21 U.S.C.section 360bbb-3(b)(1), unless the authorization is terminated  or revoked sooner.       Influenza A by PCR NEGATIVE NEGATIVE Final   Influenza B by PCR NEGATIVE NEGATIVE Final    Comment: (NOTE) The Xpert Xpress SARS-CoV-2/FLU/RSV plus assay is intended as an aid in the diagnosis of influenza from Nasopharyngeal swab specimens and should not be used as a sole basis for treatment. Nasal washings and aspirates are unacceptable for Xpert Xpress SARS-CoV-2/FLU/RSV testing.  Fact Sheet for Patients: EntrepreneurPulse.com.au  Fact Sheet for Healthcare Providers: IncredibleEmployment.be  This test is not yet approved or cleared by the [place] FDA and has been authorized for detection and/or diagnosis of SARS-CoV-2 by FDA under an Emergency Use Authorization (EUA). This EUA will remain in effect (meaning this test can be used) for the duration of the COVID-19 declaration under Section 564(b)(1) of the Act, 21 U.S.C. section 360bbb-3(b)(1), unless the authorization is terminated or revoked.  Performed at [facility], [address] [place], [place] [address]   MRSA Next Gen by PCR, Nasal     Status: None   Collection Time: 09/30/20 12:10 AM   Specimen: Nasal Mucosa; Nasal Swab  Result Value Ref Range Status   MRSA by PCR Next Gen NOT DETECTED NOT DETECTED Final    Comment: (NOTE) The GeneXpert MRSA Assay (FDA approved for NASAL specimens only), is one component of a comprehensive MRSA colonization  surveillance program. It is not intended to diagnose MRSA infection nor to guide or monitor treatment for MRSA infections. Test performance is not FDA approved in patients less than 72 years old. Performed at [facility], [address], [place], [place] [address]      Time coordinating discharge: Over 30 minutes  SIGNED:   Sidney Ace, MD  [facility] 10/03/2020, 12:38 PM Pager   If 7PM-7AM, please contact night-coverage

## 2020-10-03 NOTE — Progress Notes (Signed)
Went in to assess patient and she was off oxygen. Assessed oxygen levels and his saturation was 100% on RA. Left patient on room air and educated that if she felt short of breath to reapply oxygen or let me know so I could further assess. Call bell in reach.

## 2020-10-04 LAB — CULTURE, BLOOD (SINGLE)
Culture: NO GROWTH
Special Requests: ADEQUATE

## 2020-10-23 ENCOUNTER — Ambulatory Visit: Payer: Medicare Other | Admitting: Licensed Clinical Social Worker
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
## 2020-10-28 ENCOUNTER — Ambulatory Visit: Payer: No Typology Code available for payment source | Admitting: Psychiatry

## 2020-10-28 ENCOUNTER — Telehealth: Payer: Self-pay

## 2020-10-28 NOTE — Telephone Encounter (Signed)
Left message for pt to bring in documents for sleep study that was done out of state.

## 2020-10-29 ENCOUNTER — Institutional Professional Consult (permissible substitution): Payer: Medicaid Other | Admitting: Adult Health

## 2020-11-21 ENCOUNTER — Ambulatory Visit: Payer: Medicare Other | Admitting: Licensed Clinical Social Worker

## 2020-11-26 ENCOUNTER — Telehealth: Payer: Medicare Other | Admitting: Psychiatry

## 2020-11-26 ENCOUNTER — Other Ambulatory Visit: Payer: Self-pay
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
## 2020-11-27 ENCOUNTER — Telehealth: Payer: Self-pay | Admitting: Psychiatry

## 2020-11-27 NOTE — Telephone Encounter (Signed)
Late entry. Sent link for video visit through [facility]. Patient did not sign in. Called the patient twice for appointment scheduled today. The patient did not answer the phone. Left voice message to contact the office [phone]).

## 2020-12-02 ENCOUNTER — Other Ambulatory Visit: Payer: Self-pay | Admitting: Psychiatry

## 2020-12-02 ENCOUNTER — Telehealth: Payer: Self-pay

## 2020-12-02 MED ORDER — BUPROPION HCL ER (XL) 300 MG PO TB24: 300.0000 mg | ORAL_TABLET | Freq: Every day | ORAL | 0 refills | Status: DC

## 2020-12-02 NOTE — Telephone Encounter (Signed)
pt left message that a refill on the bupropion is needed

## 2020-12-02 NOTE — Telephone Encounter (Signed)
Ordered
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
## 2020-12-09 ENCOUNTER — Encounter: Payer: Self-pay | Admitting: Psychiatry

## 2020-12-09 ENCOUNTER — Telehealth (INDEPENDENT_AMBULATORY_CARE_PROVIDER_SITE_OTHER): Payer: Medicare Other | Admitting: Psychiatry

## 2020-12-09 ENCOUNTER — Other Ambulatory Visit: Payer: Self-pay

## 2020-12-09 DIAGNOSIS — F331 Major depressive disorder, recurrent, moderate: Secondary | ICD-10-CM

## 2020-12-09 MED ORDER — BUPROPION HCL ER (XL) 300 MG PO TB24: 300.0000 mg | ORAL_TABLET | Freq: Every day | ORAL | 0 refills | Status: DC

## 2020-12-09 NOTE — Patient Instructions (Signed)
Continue lexapro 20 mg daily  Continue Abilify 2 mg daily- monitor weight gain Continue bupropion 300 mg daily  Next appointment- 1/9 at 11 AM

## 2020-12-31 ENCOUNTER — Other Ambulatory Visit: Payer: Self-pay | Admitting: Psychiatry
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
## 2021-01-19 ENCOUNTER — Encounter: Payer: Self-pay | Admitting: Emergency Medicine

## 2021-01-19 ENCOUNTER — Emergency Department: Payer: Medicare Other

## 2021-01-19 ENCOUNTER — Emergency Department
Admission: EM | Admit: 2021-01-19 | Discharge: 2021-01-19 | Disposition: A | Discharge status: Home / Self Care | Payer: Medicare Other | Attending: Student in an Organized Health Care Education/Training Program | Admitting: Student in an Organized Health Care Education/Training Program

## 2021-01-19 ENCOUNTER — Other Ambulatory Visit: Payer: Self-pay

## 2021-01-19 DIAGNOSIS — R1013 Epigastric pain: Secondary | ICD-10-CM

## 2021-01-19 DIAGNOSIS — K257 Chronic gastric ulcer without hemorrhage or perforation: Secondary | ICD-10-CM | POA: Insufficient documentation

## 2021-01-19 DIAGNOSIS — I10 Essential (primary) hypertension: Secondary | ICD-10-CM | POA: Diagnosis not present

## 2021-01-19 LAB — URINALYSIS, ROUTINE W REFLEX MICROSCOPIC
Bacteria, UA: NONE SEEN
Bilirubin Urine: NEGATIVE
Glucose, UA: NEGATIVE mg/dL
Ketones, ur: NEGATIVE mg/dL
Leukocytes,Ua: NEGATIVE
Nitrite: NEGATIVE
Protein, ur: 100 mg/dL — AB
Specific Gravity, Urine: 1.014 (ref 1.005–1.030)
pH: 6 (ref 5.0–8.0)

## 2021-01-19 LAB — COMPREHENSIVE METABOLIC PANEL
ALT: 12 U/L (ref 0–44)
AST: 21 U/L (ref 15–41)
Albumin: 3.8 g/dL (ref 3.5–5.0)
Alkaline Phosphatase: 62 U/L (ref 38–126)
Anion gap: 9 (ref 5–15)
BUN: 15 mg/dL (ref 6–20)
CO2: 24 mmol/L (ref 22–32)
Calcium: 9.6 mg/dL (ref 8.9–10.3)
Chloride: 101 mmol/L (ref 98–111)
Creatinine, Ser: 1.14 mg/dL (ref 0.61–1.24)
GFR, Estimated: >60 mL/min (ref >60)
Glucose, Bld: 111 mg/dL — ABNORMAL HIGH (ref 70–99)
Potassium: 4.4 mmol/L (ref 3.5–5.1)
Sodium: 134 mmol/L — ABNORMAL LOW (ref 135–145)
Total Bilirubin: 0.5 mg/dL (ref 0.3–1.2)
Total Protein: 7.1 g/dL (ref 6.5–8.1)

## 2021-01-19 LAB — CBC
HCT: 45.2 % (ref 39.0–52.0)
Hemoglobin: 15.2 g/dL (ref 13.0–17.0)
MCH: 28 pg (ref 26.0–34.0)
MCHC: 33.6 g/dL (ref 30.0–36.0)
MCV: 83.4 fL (ref 80.0–100.0)
Platelets: 295 10*3/uL (ref 150–400)
RBC: 5.42 MIL/uL (ref 4.22–5.81)
RDW: 14.2 % (ref 11.5–15.5)
WBC: 13.6 10*3/uL — ABNORMAL HIGH (ref 4.0–10.5)
nRBC: 0 % (ref 0.0–0.2)

## 2021-01-19 LAB — LIPASE, BLOOD: Lipase: 54 U/L — ABNORMAL HIGH (ref 11–51)

## 2021-01-19 MED ORDER — LIDOCAINE VISCOUS HCL 2 % MT SOLN: 15.0000 mL | Freq: Two times a day (BID) | OROMUCOSAL | 2 refills | Status: DC | PRN

## 2021-01-19 MED ORDER — MORPHINE SULFATE (PF) 4 MG/ML IV SOLN
4.0000 mg | Freq: Once | INTRAVENOUS | Status: AC
  Administered 2021-01-19: 4 mg via INTRAVENOUS
  Filled 2021-01-19: qty 1

## 2021-01-19 MED ORDER — ALUM & MAG HYDROXIDE-SIMETH 200-200-20 MG/5ML PO SUSP
30.0000 mL | Freq: Once | ORAL | Status: AC
  Administered 2021-01-19: 30 mL via ORAL
  Filled 2021-01-19: qty 30

## 2021-01-19 MED ORDER — FAMOTIDINE 20 MG PO TABS: 20.0000 mg | ORAL_TABLET | Freq: Every day | ORAL | 1 refills | Status: DC

## 2021-01-19 MED ORDER — SODIUM CHLORIDE 0.9 % IV BOLUS
1000.0000 mL | Freq: Once | INTRAVENOUS | Status: AC
  Administered 2021-01-19: 1000 mL via INTRAVENOUS

## 2021-01-19 MED ORDER — FAMOTIDINE IN NACL 20-0.9 MG/50ML-% IV SOLN
20.0000 mg | Freq: Once | INTRAVENOUS | Status: AC
  Administered 2021-01-19: 20 mg via INTRAVENOUS
  Filled 2021-01-19: qty 50

## 2021-01-19 MED ORDER — PANTOPRAZOLE SODIUM 40 MG PO TBEC: 40.0000 mg | DELAYED_RELEASE_TABLET | Freq: Every day | ORAL | 6 refills | Status: AC

## 2021-01-19 MED ORDER — ONDANSETRON HCL 4 MG/2ML IJ SOLN
4.0000 mg | Freq: Once | INTRAMUSCULAR | Status: AC
  Administered 2021-01-19: 4 mg via INTRAVENOUS
  Filled 2021-01-19: qty 2

## 2021-01-19 MED ORDER — LIDOCAINE VISCOUS HCL 2 % MT SOLN
15.0000 mL | Freq: Once | OROMUCOSAL | Status: AC
  Administered 2021-01-19: 15 mL via ORAL
  Filled 2021-01-19: qty 15

## 2021-01-19 NOTE — ED Notes (Addendum)
Pt given urinal, per request.  IVF not complete yet.

## 2021-01-19 NOTE — ED Provider Triage Note (Signed)
Emergency Medicine Provider Triage Evaluation Note  [name] , a 56 y.o. adult  was evaluated in triage.  Pt complains of epigastric pain, decreased appetite and 20 pound weight loss.  This started 2 weeks ago after being hospitalized for OD of blood pressure medicine and muscle relaxers.  He reports pain is constant.  He denies of nausea, vomiting, diarrhea, constipation or blood in the stool.  He denies chest pain, chest tightness or shortness of breath.  Review of Systems  Positive: Epigastric pain, decreased appetite and weight loss Negative: Chest pain, chest tightness, shortness of breath, nausea, vomiting, diarrhea, constipation, blood in the stool, urinary symptoms  Physical Exam  BP (!) 148/104 (BP Location: Left Arm)    Pulse (!) 111    Temp 98 F (36.7 C) (Oral)    Resp 20    SpO2 94%  Gen:   Awake, appears uncomfortable Resp:  Normal effort  Cardio:  Tachycardic Abd:  Pain with palpation in the epigastric area. Active bowel sounds.  Medical Decision Making  Medically screening exam initiated at 2:39 PM.  Appropriate orders placed.  [name] was informed that the remainder of the evaluation will be completed by another provider, this initial triage assessment does not replace that evaluation, and the importance of remaining in the ED until their evaluation is complete.  Epigastric Pain, Poor Appetite, Unintentional Weight Loss  EKG CBC, CMET, Lipase, Urinalysis NPO   Jonathan Fenton, NP 01/19/21 1442

## 2021-01-19 NOTE — ED Notes (Signed)
Pt c/o abd pain x2 wks, unable to keep anything down.

## 2021-01-19 NOTE — ED Triage Notes (Signed)
Pt in via EMS from home with c/o upper abd pain and vomiting since last pm. FSBS, 151

## 2021-01-19 NOTE — ED Provider Notes (Signed)
[facility] Provider Note  Patient Contact: 3:41 PM (approximate)   History   Abdominal Pain   HPI  [name] is a 56 y.o. adult who presents to the emergency department complaining of epigastric abdominal pain.  Patient states that he has had epigastric abdominal pain for the past 4 months.  Is worsened over the past 2 weeks.  He does have a history of gastric ulcer.  Does not take any medications for same.  He has had no emesis, diarrhea.  He has been constipated for the last 2 to 3 days.  Patient has never had a perforation.  States he still has all of his organs including gallbladder and appendix.  Patient has never had issues with his gallbladder or pancreas.  Medical history includes chronic back pain, degenerative disc disease, hypertension, Barrett's esophagus with dysplasia,     Physical Exam   Triage Vital Signs: ED Triage Vitals  Enc Vitals Group     BP 01/19/21 1436 (!) 148/104     Pulse Rate 01/19/21 1436 (!) 111     Resp 01/19/21 1436 20     Temp 01/19/21 1436 98 F (36.7 C)     Temp Source 01/19/21 1436 Oral     SpO2 01/19/21 1436 94 %     Weight 01/19/21 1440 294 lb (133.4 kg)     Height 01/19/21 1440 5\' 8"  (1.727 m)     Head Circumference --      Peak Flow --      Pain Score 01/19/21 1440 10     Pain Loc --      Pain Edu? --      Excl. in [place]? --     Most recent vital signs: Vitals:   01/19/21 1436 01/19/21 1650  BP: (!) 148/104 (!) 147/90  Pulse: (!) 111 92  Resp: 20 20  Temp: 98 F (36.7 C)   SpO2: 94% 98%     General: Alert and in no acute distress.  ENT:      Ears:       Nose: No congestion/rhinnorhea.      Mouth/Throat: Mucous membranes are moist.  No gross findings in the oropharynx.  Previous midline. Neck: No stridor. No cervical spine tenderness to palpation  Cardiovascular:  Good peripheral perfusion Respiratory: Normal respiratory effort without tachypnea or retractions. Lungs  CTAB. Gastrointestinal: Bowel sounds 4 quadrants.  Soft to palpation.  Tender diffusely across the epigastric region and left and right upper quadrants.  Patient has guarding in the epigastric and right upper quadrant regions.  Positive Murphy sign.. No guarding or rigidity. No palpable masses. No distention. No CVA tenderness. Musculoskeletal: Full range of motion to all extremities.  Neurologic:  No gross focal neurologic deficits are appreciated.  Skin:   No rash noted Other:   ED Results / Procedures / Treatments   Labs (all labs ordered are listed, but only abnormal results are displayed) Labs Reviewed  LIPASE, BLOOD - Abnormal; Notable for the following components:      Result Value   Lipase 54 (*)    All other components within normal limits  COMPREHENSIVE METABOLIC PANEL - Abnormal; Notable for the following components:   Sodium 134 (*)    Glucose, Bld 111 (*)    All other components within normal limits  CBC - Abnormal; Notable for the following components:   WBC 13.6 (*)    All other components within normal limits  URINALYSIS, ROUTINE W REFLEX MICROSCOPIC -  Abnormal; Notable for the following components:   Color, Urine YELLOW (*)    APPearance CLEAR (*)    Hgb urine dipstick SMALL (*)    Protein, ur 100 (*)    All other components within normal limits     EKG     RADIOLOGY  I personally viewed and evaluated these images as part of my medical decision making, as well as reviewing the written report by the radiologist.  ED Provider Interpretation: Patient has gallstones but no evidence of cholecystitis.  No peripancreatic stranding or interstitial edema concerning for acute pancreatitis.  Incidental finding of nephrolithiasis in the left kidney.  Patient also has an infrarenal aortic aneurysm incidentally as well.  CT ABDOMEN PELVIS WO CONTRAST  Result Date: 01/19/2021 CLINICAL DATA:  Epigastric pain, history of ulcers EXAM: CT ABDOMEN AND PELVIS WITHOUT  CONTRAST TECHNIQUE: Multidetector CT imaging of the abdomen and pelvis was performed following the standard protocol without IV contrast. COMPARISON:  None. FINDINGS: Lower chest: No acute abnormality. Hepatobiliary: No solid liver abnormality is seen. Multiple gallstones in the gallbladder. No gallbladder wall thickening, or biliary dilatation. Pancreas: Unremarkable. No pancreatic ductal dilatation or surrounding inflammatory changes. Spleen: Normal in size without significant abnormality. Adrenals/Urinary Tract: Adrenal glands are unremarkable. Punctuate nonobstructive calculus of the inferior pole of the left kidney (series 5, image 68). No right-sided calculi, ureteral calculi, or hydronephrosis. Bladder is unremarkable. Stomach/Bowel: Stomach is within normal limits. Appendix appears normal. No evidence of bowel wall thickening, distention, or inflammatory changes. Vascular/Lymphatic: Aortic atherosclerosis. Aneurysm of the infrarenal abdominal aorta measuring up to 3.3 x 2.9 cm. No enlarged abdominal or pelvic lymph nodes. Reproductive: No mass or other significant abnormality. Other: Fat containing left inguinal hernia. No abdominopelvic ascites. Musculoskeletal: No acute or significant osseous findings. IMPRESSION: 1. No acute noncontrast CT findings of the abdomen or pelvis to explain epigastric pain. 2. Cholelithiasis without evidence of acute cholecystitis. 3. Punctuate nonobstructive calculus of the inferior pole of the left kidney. No hydronephrosis. 4. Aneurysm of the infrarenal abdominal aorta measuring up to 3.3 x 2.9 cm. Recommend follow-up ultrasound every 3 years if not otherwise imaged. This recommendation follows ACR consensus guidelines: White Paper of the ACR Incidental Findings Committee II on Vascular Findings. J Am Coll Radiol 2013; 10:789-794. Aortic Atherosclerosis (ICD10-I70.0). Electronically Signed   By: Delanna Ahmadi M.D.   On: 01/19/2021 16:09    PROCEDURES:  Critical Care  performed: No  Procedures   MEDICATIONS ORDERED IN ED: Medications  sodium chloride 0.9 % bolus 1,000 mL (1,000 mLs Intravenous New Bag/Given 01/19/21 1636)  alum & mag hydroxide-simeth (MAALOX/MYLANTA) 200-200-20 MG/5ML suspension 30 mL (30 mLs Oral Given 01/19/21 1634)    And  lidocaine (XYLOCAINE) 2 % viscous mouth solution 15 mL (15 mLs Oral Given 01/19/21 1633)  ondansetron (ZOFRAN) injection 4 mg (4 mg Intravenous Given 01/19/21 1636)  morphine 4 MG/ML injection 4 mg (4 mg Intravenous Given 01/19/21 1639)  famotidine (PEPCID) IVPB 20 mg premix (20 mg Intravenous New Bag/Given 01/19/21 1641)     IMPRESSION / MDM / ASSESSMENT AND PLAN / ED COURSE  I reviewed the triage vital signs and the nursing notes.                              Differential diagnosis includes, but is not limited to, cholecystitis, cholelithiasis, choledocholithiasis, pancreatitis, gastric ulcer, gastric perforation   Patient's diagnosis is consistent with epigastric pain, likely gastric ulcer.  Patient states that he is presenting to the emergency department for worsening epigastric abdominal pain.  He has a history of gastric ulcers but does not take any medications for same.  Patient states that in September he was admitted (1 September 18 according to epic after reviewing patient's medical record) after a suicide attempt with muscle relaxers and high blood pressure medication.  Since then he has had increasing epigastric pain slowly.  Patient is having no emesis.  He has had some intermittent constipation.  No urinary symptoms.  Patient states that he still able to eat and drink but he has noticed that rough foods typically aggravate his stomach more.  He does not drink alcohol.  No history of previous problems with his gallbladder.  Patient's labs revealed very minimal uptake and his white blood cell count and elevated lipase.  CMP is overall reassuring.  Given the patient's pain, labs I did order CT scan of the abdomen  pelvis to ensure no evidence of cholecystitis or peripancreatic stranding or edema to lean more towards pancreatitis.  Patient has cholelithiasis without cholecystitis.  No evidence of perforation to the stomach, incidental finding of nephrolithiasis in the kidney and small aortic aneurysm.  Patient will be treated symptomatically for likely gastric ulcers as patient does have a history of same and symptoms are consistent with same.  Patient has not seen GI recently and states that he does need a new GI doctor.  He will be referred to GI for follow-up.  Patient received GI cocktail, famotidine, Protonix here in the emergency department.  He will be prescribed Protonix, famotidine and GI cocktail at home as well.  Return precautions discussed with the patient.  Otherwise follow-up with GI.Marland Kitchen  Patient is given ED precautions to return to the ED for any worsening or new symptoms.        FINAL CLINICAL IMPRESSION(S) / ED DIAGNOSES   Final diagnoses:  Epigastric pain  Chronic gastric ulcer, unspecified whether gastric ulcer hemorrhage or perforation present     Rx / DC Orders   ED Discharge Orders          Ordered    pantoprazole (PROTONIX) 40 MG tablet  Daily        01/19/21 1715    famotidine (PEPCID) 20 MG tablet  Daily        01/19/21 1715    GI Cocktail (alum & mag hydroxide-simethicone/lidocaine)oral mixture  2 times daily PRN        01/19/21 1715             Note:  This document was prepared using Dragon voice recognition software and may include unintentional dictation errors.   Brynda Peon 01/19/21 1715    Merlyn Lot, MD 01/19/21 1739

## 2021-01-19 NOTE — ED Notes (Signed)
Sister contacted and will be on the way to pick patient up. Fluids continuing to infuse and will be discharged when completed.

## 2021-01-19 NOTE — ED Triage Notes (Signed)
Pt to ED via [facility] from home for epigastric abdominal pain x 2 weeks. Pt states that symptoms started after he got out of the hospital for trying to kill himself by ODing on BP medication and muscle relaxers. Pt states that he has lost 20 pounds in 2 weeks and is not able to eat or drink anything.

## 2021-01-20 ENCOUNTER — Ambulatory Visit: Payer: Medicare Other | Admitting: Psychiatry

## 2021-03-21 ENCOUNTER — Encounter (INDEPENDENT_AMBULATORY_CARE_PROVIDER_SITE_OTHER): Payer: Self-pay | Admitting: Nurse Practitioner

## 2021-03-21 ENCOUNTER — Encounter (INDEPENDENT_AMBULATORY_CARE_PROVIDER_SITE_OTHER): Payer: Self-pay

## 2021-03-21 ENCOUNTER — Other Ambulatory Visit: Payer: Self-pay

## 2021-03-21 ENCOUNTER — Encounter (INDEPENDENT_AMBULATORY_CARE_PROVIDER_SITE_OTHER): Payer: Medicare Other | Admitting: Nurse Practitioner

## 2021-05-14 ENCOUNTER — Other Ambulatory Visit: Payer: Self-pay | Admitting: Nurse Practitioner

## 2021-05-14 DIAGNOSIS — R1013 Epigastric pain: Secondary | ICD-10-CM

## 2021-05-14 DIAGNOSIS — K2270 Barrett's esophagus without dysplasia: Secondary | ICD-10-CM

## 2021-05-14 DIAGNOSIS — R112 Nausea with vomiting, unspecified: Secondary | ICD-10-CM

## 2021-05-15 ENCOUNTER — Ambulatory Visit
Admission: RE | Admit: 2021-05-15 | Discharge: 2021-05-15 | Disposition: A | Discharge status: Home / Self Care | Payer: Medicare Other | Attending: Gastroenterology | Admitting: Gastroenterology

## 2021-05-15 ENCOUNTER — Ambulatory Visit: Payer: Medicare Other | Admitting: Anesthesiology

## 2021-05-15 ENCOUNTER — Encounter: Payer: Self-pay | Admitting: Gastroenterology

## 2021-05-15 ENCOUNTER — Encounter
Admission: RE | Disposition: A | Discharge status: Home / Self Care | Payer: Self-pay | Source: Home / Self Care | Attending: Gastroenterology

## 2021-05-15 DIAGNOSIS — F32A Depression, unspecified: Secondary | ICD-10-CM | POA: Diagnosis not present

## 2021-05-15 DIAGNOSIS — G4730 Sleep apnea, unspecified: Secondary | ICD-10-CM | POA: Diagnosis not present

## 2021-05-15 DIAGNOSIS — F17200 Nicotine dependence, unspecified, uncomplicated: Secondary | ICD-10-CM | POA: Insufficient documentation

## 2021-05-15 DIAGNOSIS — K2960 Other gastritis without bleeding: Secondary | ICD-10-CM | POA: Insufficient documentation

## 2021-05-15 DIAGNOSIS — K2270 Barrett's esophagus without dysplasia: Secondary | ICD-10-CM | POA: Diagnosis not present

## 2021-05-15 DIAGNOSIS — K319 Disease of stomach and duodenum, unspecified: Secondary | ICD-10-CM | POA: Diagnosis not present

## 2021-05-15 DIAGNOSIS — R1013 Epigastric pain: Secondary | ICD-10-CM | POA: Diagnosis not present

## 2021-05-15 DIAGNOSIS — Z7951 Long term (current) use of inhaled steroids: Secondary | ICD-10-CM | POA: Diagnosis not present

## 2021-05-15 DIAGNOSIS — Z79899 Other long term (current) drug therapy: Secondary | ICD-10-CM | POA: Insufficient documentation

## 2021-05-15 DIAGNOSIS — I2510 Atherosclerotic heart disease of native coronary artery without angina pectoris: Secondary | ICD-10-CM | POA: Insufficient documentation

## 2021-05-15 DIAGNOSIS — Z794 Long term (current) use of insulin: Secondary | ICD-10-CM | POA: Diagnosis not present

## 2021-05-15 DIAGNOSIS — E119 Type 2 diabetes mellitus without complications: Secondary | ICD-10-CM | POA: Insufficient documentation

## 2021-05-15 DIAGNOSIS — Z7984 Long term (current) use of oral hypoglycemic drugs: Secondary | ICD-10-CM | POA: Diagnosis not present

## 2021-05-15 DIAGNOSIS — J449 Chronic obstructive pulmonary disease, unspecified: Secondary | ICD-10-CM | POA: Insufficient documentation

## 2021-05-15 DIAGNOSIS — I10 Essential (primary) hypertension: Secondary | ICD-10-CM | POA: Insufficient documentation

## 2021-05-15 DIAGNOSIS — Z6841 Body Mass Index (BMI) 40.0 and over, adult: Secondary | ICD-10-CM | POA: Insufficient documentation

## 2021-05-15 HISTORY — DX: Polyneuropathy, unspecified: G62.9

## 2021-05-15 HISTORY — DX: Carpal tunnel syndrome, unspecified upper limb: G56.00

## 2021-05-15 HISTORY — PX: ESOPHAGOGASTRODUODENOSCOPY: SHX5428

## 2021-05-15 LAB — GLUCOSE, CAPILLARY: Glucose-Capillary: 107 mg/dL — ABNORMAL HIGH (ref 70–99)

## 2021-05-15 SURGERY — EGD (ESOPHAGOGASTRODUODENOSCOPY)
Anesthesia: General

## 2021-05-15 MED ORDER — LIDOCAINE HCL (CARDIAC) PF 100 MG/5ML IV SOSY
PREFILLED_SYRINGE | INTRAVENOUS | Status: DC | PRN
  Administered 2021-05-15: 100 mg via INTRAVENOUS

## 2021-05-15 MED ORDER — PROPOFOL 500 MG/50ML IV EMUL: INTRAVENOUS | Status: DC | PRN

## 2021-05-15 MED ORDER — DEXMEDETOMIDINE (PRECEDEX) IN NS 20 MCG/5ML (4 MCG/ML) IV SYRINGE
PREFILLED_SYRINGE | INTRAVENOUS | Status: DC | PRN
  Administered 2021-05-15: 12 ug via INTRAVENOUS
  Administered 2021-05-15: 8 ug via INTRAVENOUS

## 2021-05-15 MED ORDER — SODIUM CHLORIDE 0.9 % IV SOLN: INTRAVENOUS | Status: DC

## 2021-05-15 MED ORDER — PROPOFOL 10 MG/ML IV BOLUS
INTRAVENOUS | Status: DC | PRN
  Administered 2021-05-15 (×3): 20 mg via INTRAVENOUS
  Administered 2021-05-15: 80 mg via INTRAVENOUS
  Administered 2021-05-15: 20 mg via INTRAVENOUS

## 2021-05-15 MED ORDER — PROPOFOL 500 MG/50ML IV EMUL
INTRAVENOUS | Status: DC | PRN
  Administered 2021-05-15: 140 ug/kg/min via INTRAVENOUS

## 2021-05-15 NOTE — Interval H&P Note (Signed)
History and Physical Interval Note: Preprocedure H&P from 05/15/21 ? was reviewed and there was no interval change after seeing and examining the patient.  Written consent was obtained from the patient after discussion of risks, benefits, and alternatives. Patient has consented to proceed with Esophagogastroduodenoscopy with possible intervention ? ? ?05/15/2021 ?12:17 PM ? ?[name]  has presented today for surgery, with the diagnosis of Barrett's esophagus without dysplasia (K22.70) ?Epigastric pain (R10.13) ?Nausea and vomiting, unspecified vomiting type (R11.2).  The various methods of treatment have been discussed with the patient and family. After consideration of risks, benefits and other options for treatment, the patient has consented to  Procedure(s) with comments: ?ESOPHAGOGASTRODUODENOSCOPY (EGD) (N/A) - DM as a surgical intervention.  The patient's history has been reviewed, patient examined, no change in status, stable for surgery.  I have reviewed the patient's chart and labs.  Questions were answered to the patient's satisfaction.   ? ? ?Annamaria Helling ? ? ?

## 2021-05-15 NOTE — Transfer of Care (Signed)
Immediate Anesthesia Transfer of Care Note ? ?Patient: [name] ? ?Procedure(s) Performed: ESOPHAGOGASTRODUODENOSCOPY (EGD) ? ?Patient Location: PACU and Endoscopy Unit ? ?Anesthesia Type:General ? ?Level of Consciousness: drowsy ? ?Airway & Oxygen Therapy: Patient Spontanous Breathing ? ?Post-op Assessment: Report given to RN and Post -op Vital signs reviewed and stable ? ?Post vital signs: Reviewed and stable ? ?Last Vitals:  ?Vitals Value Taken Time  ?BP 182/80 05/15/21 1403  ?Temp    ?Pulse 96 05/15/21 1404  ?Resp 19 05/15/21 1404  ?SpO2 96 % 05/15/21 1404  ?Vitals shown include unvalidated device data. ? ?Last Pain:  ?Vitals:  ? 05/15/21 1117  ?TempSrc: Temporal  ?PainSc: 1   ?   ? ?  ? ?Complications: No notable events documented. ?

## 2021-05-15 NOTE — Anesthesia Preprocedure Evaluation (Addendum)
Anesthesia Evaluation  ?Patient identified by MRN, date of birth, ID band ?Patient awake ? ? ? ?Reviewed: ?Allergy & Precautions, NPO status , Patient's Chart, lab work & pertinent test results ? ?History of Anesthesia Complications ?Negative for: history of anesthetic complications ? ?Airway ?Mallampati: III ? ? ?Neck ROM: Full ? ? ? Dental ? ?(+) Edentulous Upper, Edentulous Lower ?  ?Pulmonary ?sleep apnea , COPD, Current Smoker (less than 1 ppd) and Patient abstained from smoking.,  ?  ?Pulmonary exam normal ?breath sounds clear to auscultation ? ? ? ? ? ? Cardiovascular ?hypertension, + CAD  ?Normal cardiovascular exam ?Rhythm:Regular Rate:Normal ? ?ECG 01/19/21:  ?Sinus tachycardia ?Rightward axis ? ?Myocardial perfusion 04/06/19:  ?Pharmacological myocardial perfusion imaging study with no significant  ischemia ?Normal wall motion, EF estimated at 60% ?No EKG changes concerning for ischemia at peak stress or in recovery. ?CT attenuation correction images: No significant aortic atherosclerosis or coronary calcification noted ?Low risk scan ?  ?Neuro/Psych ?PSYCHIATRIC DISORDERS Depression Chronic back pain ? Neuromuscular disease (diabetic polyneuropathy)   ? GI/Hepatic ?PUD, Barrett esophagus ?  ?Endo/Other  ?diabetes, Type 2, Insulin DependentClass 3 obesity ? Renal/GU ?negative Renal ROS  ? ?  ?Musculoskeletal ? ? Abdominal ?  ?Peds ? Hematology ?negative hematology ROS ?(+)   ?Anesthesia Other Findings ? ? Reproductive/Obstetrics ? ?  ? ? ? ? ? ? ? ? ? ? ? ? ? ?  ?  ? ? ? ? ? ? ? ?Anesthesia Physical ?Anesthesia Plan ? ?ASA: 3 ? ?Anesthesia Plan: General  ? ?Post-op Pain Management:   ? ?Induction: Intravenous ? ?PONV Risk Score and Plan: 2 and Propofol infusion, TIVA and Treatment may vary due to age or medical condition ? ?Airway Management Planned: Natural Airway ? ?Additional Equipment:  ? ?Intra-op Plan:  ? ?Post-operative Plan:  ? ?Informed Consent: I have reviewed the  patients History and Physical, chart, labs and discussed the procedure including the risks, benefits and alternatives for the proposed anesthesia with the patient or authorized representative who has indicated his/her understanding and acceptance.  ? ? ? ? ? ?Plan Discussed with: CRNA ? ?Anesthesia Plan Comments: (LMA/GETA backup discussed.  Patient consented for risks of anesthesia including but not limited to:  ?- adverse reactions to medications ?- damage to eyes, teeth, lips or other oral mucosa ?- nerve damage due to positioning  ?- sore throat or hoarseness ?- damage to heart, brain, nerves, lungs, other parts of body or loss of life ? ?Informed patient about role of CRNA in peri- and intra-operative care.  Patient voiced understanding.)  ? ? ? ? ? ? ?Anesthesia Quick Evaluation ? ?

## 2021-05-15 NOTE — H&P (Signed)
? ?Pre-Procedure H&P ?  ?Patient ID: [name] is a 56 y.o. adult. ? ?Gastroenterology Provider: Annamaria Helling, DO ? ?Referring Provider: Dawson Bills, NP ?PCP: Jonathan Burrow, MD ? ?Date: 05/15/2021 ? ?HPI ?[name] is a 56 y.o. adult who presents today for Esophagogastroduodenoscopy for Upper abdominal pain, postprandial pain and Barrett's surveillance. ?Patient with ongoing upper abdominal pain since September.  Notices this is occurred most postprandially.  Stopped Ozempic in January after 17 pounds of weight loss, however, has gained it back since. ?Notes reflux with regurgitation.  Some dysphagia symptoms with particular foods ?History of long segment Barrett's esophagus last undergoing EGD in 2019 approximately 8 cm in length (GEJ at 44 cm) ?Longstanding Mobic and Excedrin use. ?Notes increasing depression symptoms ? ?CT unremarkable for etiology of abdominal pain ? ?Most recent lab work creatinine 1.2 hemoglobin 14 MCV 85 platelets 267,000 negative for celiac disease and stool negative for H. pylori ? ?Currently on Protonix.  Actively smoking tobacco 1 pack/day ? ?Father with history of gastric cancer.  No family history of colon cancer or colon polyps ? ? ? ?Past Medical History:  ?Diagnosis Date  ? Carpal tunnel syndrome   ? bilateral  ? COPD (chronic obstructive pulmonary disease) ([place])   ? Coronary artery disease   ? Diabetes mellitus without complication ([place])   ? Endotracheally intubated 09/30/2020  ? Hyperlipidemia   ? Hypertension   ? Neuropathy   ? On mechanically assisted ventilation ([place]) 09/30/2020  ? Peptic ulcer   ? Sleep apnea   ? ? ?Past Surgical History:  ?Procedure Laterality Date  ? COLONOSCOPY WITH PROPOFOL N/A 11/09/2016  ? Procedure: COLONOSCOPY WITH PROPOFOL;  Surgeon: Jonathan Sails, MD;  Location: [facility] ENDOSCOPY;  Service: Endoscopy;  Laterality: N/A;  ? COLONOSCOPY WITH PROPOFOL N/A 07/06/2017  ? Procedure: COLONOSCOPY WITH PROPOFOL;  Surgeon:  Jonathan Sails, MD;  Location: [facility] ENDOSCOPY;  Service: Endoscopy;  Laterality: N/A;  ? ESOPHAGOGASTRODUODENOSCOPY (EGD) WITH PROPOFOL N/A 11/09/2016  ? Procedure: ESOPHAGOGASTRODUODENOSCOPY (EGD) WITH PROPOFOL;  Surgeon: Jonathan Sails, MD;  Location: [facility] ENDOSCOPY;  Service: Endoscopy;  Laterality: N/A;  ? ESOPHAGOGASTRODUODENOSCOPY (EGD) WITH PROPOFOL N/A 07/06/2017  ? Procedure: ESOPHAGOGASTRODUODENOSCOPY (EGD) WITH PROPOFOL;  Surgeon: Jonathan Sails, MD;  Location: [facility] ENDOSCOPY;  Service: Endoscopy;  Laterality: N/A;  ? ? ?Family History ?Father-gastric cancer ?No other h/o GI disease or malignancy ? ?Review of Systems  ?Constitutional:  Negative for activity change, appetite change, chills, diaphoresis, fatigue, fever and unexpected weight change.  ?HENT:  Positive for trouble swallowing. Negative for voice change.   ?Respiratory:  Negative for shortness of breath and wheezing.   ?Cardiovascular:  Negative for chest pain, palpitations and leg swelling.  ?Gastrointestinal:  Positive for abdominal pain and nausea. Negative for abdominal distention, anal bleeding, blood in stool, constipation, diarrhea, rectal pain and vomiting.  ?Musculoskeletal:  Negative for arthralgias and myalgias.  ?Skin:  Negative for color change and pallor.  ?Neurological:  Negative for dizziness, syncope and weakness.  ?Psychiatric/Behavioral:  Negative for agitation and confusion.   ?All other systems reviewed and are negative.  ? ?Medications ?No current facility-administered medications on file prior to encounter.  ? ?Current Outpatient Medications on File Prior to Encounter  ?Medication Sig Dispense Refill  ? atorvastatin (LIPITOR) 20 MG tablet Take 20 mg by mouth at bedtime.     ? famotidine (PEPCID) 20 MG tablet Take 1 tablet (20 mg total) by mouth daily. 30 tablet 1  ? gabapentin (  NEURONTIN) 300 MG capsule Take 300 mg by mouth 3 (three) times daily.     ? hydrochlorothiazide (MICROZIDE) 12.5 MG capsule Take 12.5 mg  by mouth daily.  0  ? loratadine (CLARITIN) 10 MG tablet Take 10 mg by mouth daily.    ? metFORMIN (GLUCOPHAGE) 1000 MG tablet Take 1,000 mg by mouth 2 (two) times daily with a meal.    ? oxybutynin (DITROPAN) 5 MG tablet Take 5 mg by mouth every 12 (twelve) hours.    ? spironolactone (ALDACTONE) 50 MG tablet Take 50 mg by mouth 2 (two) times daily.    ? albuterol (VENTOLIN HFA) 108 (90 Base) MCG/ACT inhaler Inhale 2 puffs into the lungs every 4 (four) hours as needed for wheezing or shortness of breath.     ? ARIPiprazole (ABILIFY) 2 MG tablet Take 1 tablet (2 mg total) by mouth daily. 30 tablet 0  ? aspirin-acetaminophen-caffeine (EXCEDRIN MIGRAINE) 401-027-25 MG tablet Take by mouth every 6 (six) hours as needed for headache.    ? buPROPion (WELLBUTRIN XL) 300 MG 24 hr tablet Take 1 tablet (300 mg total) by mouth daily. 30 tablet 0  ? Calcium-Magnesium-Vitamin D (CALCIUM 1200+D3 PO) Take by mouth. (Patient not taking: Reported on 03/21/2021)    ? clindamycin (CLEOCIN T) 1 % external solution APPLY TO AFFECTED AREA RASH ON BUTTOCKS1 TIME A WEEK ON SUNDAYS 60 mL 1  ? escitalopram (LEXAPRO) 20 MG tablet Take 20 mg by mouth daily.    ? estradiol (ESTRACE) 2 MG tablet Take by mouth.    ? GI Cocktail (alum & mag hydroxide-simethicone/lidocaine)oral mixture Take 15 mLs by mouth 2 (two) times daily as needed (Epigastric abdominal pain). 180 mL 2  ? hydrocortisone 2.5 % lotion APPLY TO AFFECTEDAREA RASH ON BUTTOCKS 3 DAYS PER WEEK. TUESDAY, THURSDAY, SATURDAY, AND AS NEEDED FOR FLARES 59 mL 1  ? Ipratropium-Albuterol (COMBIVENT) 20-100 MCG/ACT AERS respimat Inhale into the lungs.    ? ketoconazole (NIZORAL) 2 % cream Apply to a rash on buttock 3 days a week. On Monday, Wednesday and Friday    ? meloxicam (MOBIC) 15 MG tablet Take 1 tablet (15 mg total) by mouth daily. 90 tablet 0  ? OZEMPIC, 0.25 OR 0.5 MG/DOSE, 2 MG/1.5ML SOPN Inject 0.5 mg into the skin once a week.    ? pantoprazole (PROTONIX) 40 MG tablet Take 1 tablet  (40 mg total) by mouth daily. 30 tablet 6  ? simethicone (MYLICON) 366 MG chewable tablet Chew 125 mg by mouth every 6 (six) hours as needed for flatulence.    ? tiZANidine (ZANAFLEX) 4 MG tablet Take 1 tablet (4 mg total) by mouth every 8 (eight) hours as needed for muscle spasms. 90 tablet 2  ? TRELEGY ELLIPTA 100-62.5-25 MCG/INH AEPB Inhale 1 puff into the lungs daily.    ? triamcinolone ointment (KENALOG) 0.1 % APPLY AT BEDTIME EVERYDAY TO FEET AND LOWER LEGS. AVOID FACE,GROIN, AND AXILLA 80 g 1  ? TRUEPLUS 5-BEVEL PEN NEEDLES 31G X 6 MM MISC     ? umeclidinium-vilanterol (ANORO ELLIPTA) 62.5-25 MCG/INH AEPB Inhale into the lungs.    ? ? ?Pertinent medications related to GI and procedure were reviewed by me with the patient prior to the procedure ? ? ?Current Facility-Administered Medications:  ?  0.9 %  sodium chloride infusion, , Intravenous, Continuous, Jonathan Helling, DO, Last Rate: 20 mL/hr at 05/15/21 1133, New Bag at 05/15/21 1133 ?  ?  ? ?Allergies  ?Allergen Reactions  ? Shellfish  Allergy Swelling  ? Codeine Rash  ? Lisinopril Cough  ? ?Allergies were reviewed by me prior to the procedure ? ?Objective  ? ?Body mass index is 45.31 kg/m?. ?Vitals:  ? 05/15/21 1117  ?BP: 140/80  ?Pulse: 96  ?Resp: 20  ?Temp: (!) 96.9 ?F (36.1 ?C)  ?TempSrc: Temporal  ?SpO2: 98%  ?Weight: 135.2 kg  ?Height: '5\' 8"'$  (1.727 m)  ? ? ? ?Physical Exam ?Vitals and nursing note reviewed.  ?Constitutional:   ?   General: She is not in acute distress. ?   Appearance: Normal appearance. She is obese. She is not ill-appearing, toxic-appearing or diaphoretic.  ?HENT:  ?   Head: Normocephalic and atraumatic.  ?   Nose: Nose normal.  ?   Mouth/Throat:  ?   Mouth: Mucous membranes are moist.  ?   Pharynx: Oropharynx is clear.  ?Eyes:  ?   General: No scleral icterus. ?   Extraocular Movements: Extraocular movements intact.  ?Cardiovascular:  ?   Rate and Rhythm: Normal rate and regular rhythm.  ?   Heart sounds: Normal heart sounds.  No murmur heard. ?  No friction rub. No gallop.  ?Pulmonary:  ?   Effort: Pulmonary effort is normal. No respiratory distress.  ?   Breath sounds: Wheezing present. No rhonchi or rales.  ?Abdominal:  ?

## 2021-05-15 NOTE — Op Note (Signed)
[facility] ?Gastroenterology ?Patient Name: [name] ?Procedure Date: 05/15/2021 1:35 PM ?MRN: [id number] ?Account #: [id number] ?Date of Birth: Aug 19, 1965 ?Admit Type: Outpatient ?Age: 56 ?Room: [facility] ENDO ROOM 1 ?Gender: Male ?Note Status: Finalized ?Instrument Name: Upper Endoscope [id number] ?Procedure:             Upper GI endoscopy ?Indications:           Epigastric abdominal pain ?Providers:             Annamaria Helling DO, DO ?Referring MD:          Theotis Burrow (Referring MD) ?Medicines:             Monitored Anesthesia Care ?Complications:         No immediate complications. Estimated blood loss:  ?                       Minimal. ?Procedure:             Pre-Anesthesia Assessment: ?                       - Prior to the procedure, a History and Physical was  ?                       performed, and patient medications and allergies were  ?                       reviewed. The patient is competent. The risks and  ?                       benefits of the procedure and the sedation options and  ?                       risks were discussed with the patient. All questions  ?                       were answered and informed consent was obtained.  ?                       Patient identification and proposed procedure were  ?                       verified by the physician, the nurse, the anesthetist  ?                       and the technician in the endoscopy suite. Mental  ?                       Status Examination: alert and oriented. Airway  ?                       Examination: normal oropharyngeal airway and neck  ?                       mobility. Respiratory Examination: clear to  ?                       auscultation. CV Examination: RRR, no murmurs, no S3  ?  or S4. Prophylactic Antibiotics: The patient does not  ?                       require prophylactic antibiotics. Prior  ?                       Anticoagulants: The patient has taken no previous  ?                        anticoagulant or antiplatelet agents. ASA Grade  ?                       Assessment: III - A patient with severe systemic  ?                       disease. After reviewing the risks and benefits, the  ?                       patient was deemed in satisfactory condition to  ?                       undergo the procedure. The anesthesia plan was to use  ?                       monitored anesthesia care (MAC). Immediately prior to  ?                       administration of medications, the patient was  ?                       re-assessed for adequacy to receive sedatives. The  ?                       heart rate, respiratory rate, oxygen saturations,  ?                       blood pressure, adequacy of pulmonary ventilation, and  ?                       response to care were monitored throughout the  ?                       procedure. The physical status of the patient was  ?                       re-assessed after the procedure. ?                       After obtaining informed consent, the endoscope was  ?                       passed under direct vision. Throughout the procedure,  ?                       the patient's blood pressure, pulse, and oxygen  ?                       saturations were monitored continuously. The  ?  Endosonoscope was introduced through the mouth, and  ?                       advanced to the second part of duodenum. The upper GI  ?                       endoscopy was accomplished without difficulty. The  ?                       patient tolerated the procedure well. ?Findings: ?     The duodenal bulb, first portion of the duodenum and second portion of  ?     the duodenum were normal. Estimated blood loss: none. ?     Localized severe inflammation characterized by erosions and erythema was  ?     found in the gastric antrum. Biopsies were taken with a cold forceps for  ?     Helicobacter pylori testing. Estimated blood loss was minimal. ?     One non-bleeding  superficial gastric ulcer with a clean ulcer base  ?     (Forrest Class III) was found in the gastric antrum. The lesion was 20  ?     mm in largest dimension. Biopsies were taken with a cold forceps for  ?     histology. Estimated blood loss was minimal. ?     There were esophageal mucosal changes classified as Barrett's stage  ?     C2-M8 per Prague criteria present in the lower third of the esophagus.  ?     The maximum longitudinal extent of these mucosal changes was 8 cm in  ?     length. Mucosa was biopsied with a cold forceps for histology. A total  ?     of 5 specimen bottles were sent to pathology. Biopsies in 4 quadrant  ?     fashion every 2 cm. No nodularity/polyp noted on NBI. Estimated blood  ?     loss: none. ?     Esophagogastric landmarks were identified: the gastroesophageal junction  ?     was found at 44 cm from the incisors. ?     The exam of the esophagus was otherwise normal. ?Impression:            - Normal duodenal bulb, first portion of the duodenum  ?                       and second portion of the duodenum. ?                       - Gastritis. Biopsied. ?                       - Non-bleeding gastric ulcer with a clean ulcer base  ?                       (Forrest Class III). Biopsied. ?                       - Esophageal mucosal changes classified as Barrett's  ?                       stage C2-M8 per Prague criteria. Biopsied. ?                       -  Esophagogastric landmarks identified. ?Recommendation:        - Discharge patient to home. ?                       - Resume previous diet. ?                       - Continue present medications. ?                       - Increase pantoprazole to 40 mg twice a day. Take 30  ?                       minutes prior to meal ?                       - No aspirin, ibuprofen, naproxen, or other  ?                       non-steroidal anti-inflammatory drugs indefinitely. ?                       - Await pathology results. ?                       -  Repeat upper endoscopy for surveillance based on  ?                       pathology results. ?                       - Return to GI office as previously scheduled. ?                       - The findings and recommendations were discussed with  ?                       the patient. ?Procedure Code(s):     --- Professional --- ?                       [phone], Esophagogastroduodenoscopy, flexible,  ?                       transoral; with biopsy, single or multiple ?Diagnosis Code(s):     --- Professional --- ?                       K22.70, Barrett's esophagus without dysplasia ?                       K29.70, Gastritis, unspecified, without bleeding ?                       K25.9, Gastric ulcer, unspecified as acute or chronic,  ?                       without hemorrhage or perforation ?                       R10.13, Epigastric pain ?CPT copyright 2019 [employer]. All rights reserved. ?The codes documented in this report are preliminary and upon coder review may  ?  be revised to meet current compliance requirements. ?Attending Participation: ?     I personally performed the entire procedure. ?Volney American, DO ?Annamaria Helling DO, DO ?05/15/2021 2:06:34 PM ?This report has been signed electronically. ?Number of Addenda: 0 ?Note Initiated On: 05/15/2021 1:35 PM ?Estimated Blood Loss:  Estimated blood loss was minimal. ?     [facility] ?

## 2021-05-16 ENCOUNTER — Encounter: Payer: Self-pay | Admitting: Gastroenterology

## 2021-05-16 NOTE — Anesthesia Postprocedure Evaluation (Signed)
Anesthesia Post Note ? ?Patient: RAYHAAN HUSTER ? ?Procedure(s) Performed: ESOPHAGOGASTRODUODENOSCOPY (EGD) ? ?Patient location during evaluation: PACU ?Anesthesia Type: General ?Level of consciousness: awake and alert, oriented and patient cooperative ?Pain management: pain level controlled ?Vital Signs Assessment: post-procedure vital signs reviewed and stable ?Respiratory status: spontaneous breathing, nonlabored ventilation and respiratory function stable ?Cardiovascular status: blood pressure returned to baseline and stable ?Postop Assessment: adequate PO intake ?Anesthetic complications: no ? ? ?No notable events documented. ? ? ?Last Vitals:  ?Vitals:  ? 05/15/21 1413 05/15/21 1423  ?BP: (!) 135/100 140/88  ?Pulse: 93 88  ?Resp: 20 19  ?Temp:    ?SpO2: 96% 95%  ?  ?Last Pain:  ?Vitals:  ? 05/15/21 1423  ?TempSrc:   ?PainSc: 0-No pain  ? ? ?  ?  ?  ?  ?  ?  ? ?Darrin Nipper ? ? ? ? ?

## 2021-05-19 LAB — SURGICAL PATHOLOGY

## 2021-08-20 ENCOUNTER — Other Ambulatory Visit: Payer: Self-pay | Admitting: Nurse Practitioner

## 2021-08-20 DIAGNOSIS — R1013 Epigastric pain: Secondary | ICD-10-CM

## 2021-08-25 ENCOUNTER — Emergency Department: Payer: Medicare Other

## 2021-08-25 ENCOUNTER — Encounter: Payer: Self-pay | Admitting: Intensive Care

## 2021-08-25 ENCOUNTER — Observation Stay
Admission: EM | Admit: 2021-08-25 | Discharge: 2021-08-26 | Disposition: A | Discharge status: Home / Self Care | Payer: Medicare Other | Attending: Internal Medicine | Admitting: Internal Medicine

## 2021-08-25 ENCOUNTER — Other Ambulatory Visit: Payer: Self-pay

## 2021-08-25 DIAGNOSIS — E785 Hyperlipidemia, unspecified: Secondary | ICD-10-CM | POA: Diagnosis not present

## 2021-08-25 DIAGNOSIS — Z7984 Long term (current) use of oral hypoglycemic drugs: Secondary | ICD-10-CM | POA: Insufficient documentation

## 2021-08-25 DIAGNOSIS — E1142 Type 2 diabetes mellitus with diabetic polyneuropathy: Secondary | ICD-10-CM | POA: Diagnosis not present

## 2021-08-25 DIAGNOSIS — R0602 Shortness of breath: Secondary | ICD-10-CM

## 2021-08-25 DIAGNOSIS — F17210 Nicotine dependence, cigarettes, uncomplicated: Secondary | ICD-10-CM | POA: Insufficient documentation

## 2021-08-25 DIAGNOSIS — I10 Essential (primary) hypertension: Secondary | ICD-10-CM | POA: Insufficient documentation

## 2021-08-25 DIAGNOSIS — E1140 Type 2 diabetes mellitus with diabetic neuropathy, unspecified: Secondary | ICD-10-CM | POA: Diagnosis not present

## 2021-08-25 DIAGNOSIS — I2510 Atherosclerotic heart disease of native coronary artery without angina pectoris: Secondary | ICD-10-CM | POA: Insufficient documentation

## 2021-08-25 DIAGNOSIS — J9601 Acute respiratory failure with hypoxia: Secondary | ICD-10-CM | POA: Insufficient documentation

## 2021-08-25 DIAGNOSIS — Z79899 Other long term (current) drug therapy: Secondary | ICD-10-CM | POA: Insufficient documentation

## 2021-08-25 DIAGNOSIS — K219 Gastro-esophageal reflux disease without esophagitis: Secondary | ICD-10-CM

## 2021-08-25 DIAGNOSIS — Z20822 Contact with and (suspected) exposure to covid-19: Secondary | ICD-10-CM | POA: Diagnosis not present

## 2021-08-25 DIAGNOSIS — Z7985 Long-term (current) use of injectable non-insulin antidiabetic drugs: Secondary | ICD-10-CM | POA: Insufficient documentation

## 2021-08-25 DIAGNOSIS — J441 Chronic obstructive pulmonary disease with (acute) exacerbation: Principal | ICD-10-CM | POA: Insufficient documentation

## 2021-08-25 DIAGNOSIS — F32A Depression, unspecified: Secondary | ICD-10-CM | POA: Diagnosis present

## 2021-08-25 DIAGNOSIS — R0902 Hypoxemia: Secondary | ICD-10-CM

## 2021-08-25 LAB — BASIC METABOLIC PANEL
Anion gap: 9 (ref 5–15)
BUN: 18 mg/dL (ref 6–20)
CO2: 23 mmol/L (ref 22–32)
Calcium: 9.7 mg/dL (ref 8.9–10.3)
Chloride: 103 mmol/L (ref 98–111)
Creatinine, Ser: 1.23 mg/dL (ref 0.61–1.24)
GFR, Estimated: >60 mL/min (ref >60)
Glucose, Bld: 117 mg/dL — ABNORMAL HIGH (ref 70–99)
Potassium: 4.6 mmol/L (ref 3.5–5.1)
Sodium: 135 mmol/L (ref 135–145)

## 2021-08-25 LAB — RESPIRATORY PANEL BY PCR

## 2021-08-25 LAB — CBC
HCT: 44.7 % (ref 39.0–52.0)
Hemoglobin: 14.5 g/dL (ref 13.0–17.0)
MCH: 26.8 pg (ref 26.0–34.0)
MCHC: 32.4 g/dL (ref 30.0–36.0)
MCV: 82.5 fL (ref 80.0–100.0)
Platelets: 325 10*3/uL (ref 150–400)
RBC: 5.42 MIL/uL (ref 4.22–5.81)
RDW: 14.4 % (ref 11.5–15.5)
WBC: 10.1 10*3/uL (ref 4.0–10.5)
nRBC: 0 % (ref 0.0–0.2)

## 2021-08-25 LAB — GLUCOSE, CAPILLARY: Glucose-Capillary: 172 mg/dL — ABNORMAL HIGH (ref 70–99)

## 2021-08-25 LAB — RESP PANEL BY RT-PCR (FLU A&B, COVID) ARPGX2
Influenza A by PCR: NEGATIVE
Influenza B by PCR: NEGATIVE
SARS Coronavirus 2 by RT PCR: NEGATIVE

## 2021-08-25 LAB — TROPONIN I (HIGH SENSITIVITY)
Troponin I (High Sensitivity): 7 ng/L (ref <18)
Troponin I (High Sensitivity): 6 ng/L (ref <18)

## 2021-08-25 LAB — PROCALCITONIN: Procalcitonin: <0.1 ng/mL

## 2021-08-25 LAB — LACTIC ACID, PLASMA
Lactic Acid, Venous: 1.4 mmol/L (ref 0.5–1.9)
Lactic Acid, Venous: 1.6 mmol/L (ref 0.5–1.9)

## 2021-08-25 MED ORDER — ACETAMINOPHEN 650 MG RE SUPP: 650.0000 mg | Freq: Four times a day (QID) | RECTAL | Status: DC | PRN

## 2021-08-25 MED ORDER — PANTOPRAZOLE SODIUM 40 MG PO TBEC
40.0000 mg | DELAYED_RELEASE_TABLET | Freq: Every day | ORAL | Status: DC
  Administered 2021-08-26: 40 mg via ORAL
  Filled 2021-08-25: qty 1

## 2021-08-25 MED ORDER — INSULIN ASPART 100 UNIT/ML IJ SOLN: 0.0000 [IU] | Freq: Every day | INTRAMUSCULAR | Status: DC

## 2021-08-25 MED ORDER — ENOXAPARIN SODIUM 80 MG/0.8ML IJ SOSY
0.5000 mg/kg | PREFILLED_SYRINGE | INTRAMUSCULAR | Status: DC
  Administered 2021-08-25: 67.5 mg via SUBCUTANEOUS
  Filled 2021-08-25 (×2): qty 0.68

## 2021-08-25 MED ORDER — GUAIFENESIN ER 600 MG PO TB12
600.0000 mg | ORAL_TABLET | Freq: Two times a day (BID) | ORAL | Status: DC
  Administered 2021-08-25 – 2021-08-26 (×2): 600 mg via ORAL
  Filled 2021-08-25 (×2): qty 1

## 2021-08-25 MED ORDER — IPRATROPIUM-ALBUTEROL 0.5-2.5 (3) MG/3ML IN SOLN
3.0000 mL | Freq: Once | RESPIRATORY_TRACT | Status: AC
  Administered 2021-08-25: 3 mL via RESPIRATORY_TRACT
  Filled 2021-08-25: qty 3

## 2021-08-25 MED ORDER — TIZANIDINE HCL 4 MG PO TABS: 4.0000 mg | ORAL_TABLET | Freq: Three times a day (TID) | ORAL | Status: DC | PRN

## 2021-08-25 MED ORDER — HYDROCHLOROTHIAZIDE 25 MG PO TABS
12.5000 mg | ORAL_TABLET | Freq: Every day | ORAL | Status: DC
  Administered 2021-08-26: 12.5 mg via ORAL
  Filled 2021-08-25: qty 1

## 2021-08-25 MED ORDER — LORATADINE 10 MG PO TABS
10.0000 mg | ORAL_TABLET | Freq: Every day | ORAL | Status: DC
  Administered 2021-08-26: 10 mg via ORAL
  Filled 2021-08-25: qty 1

## 2021-08-25 MED ORDER — SPIRONOLACTONE 25 MG PO TABS
50.0000 mg | ORAL_TABLET | Freq: Two times a day (BID) | ORAL | Status: DC
  Administered 2021-08-25 – 2021-08-26 (×2): 50 mg via ORAL
  Filled 2021-08-25 (×2): qty 2

## 2021-08-25 MED ORDER — ESCITALOPRAM OXALATE 10 MG PO TABS
20.0000 mg | ORAL_TABLET | Freq: Every day | ORAL | Status: DC
  Administered 2021-08-26: 20 mg via ORAL
  Filled 2021-08-25: qty 2

## 2021-08-25 MED ORDER — ESTRADIOL 1 MG PO TABS
2.0000 mg | ORAL_TABLET | Freq: Every day | ORAL | Status: DC
  Administered 2021-08-26: 2 mg via ORAL
  Filled 2021-08-25: qty 2

## 2021-08-25 MED ORDER — ASPIRIN-ACETAMINOPHEN-CAFFEINE 250-250-65 MG PO TABS
1.0000 | ORAL_TABLET | Freq: Four times a day (QID) | ORAL | Status: DC | PRN
Start: 2021-08-25 — End: 2021-08-26

## 2021-08-25 MED ORDER — MAGNESIUM HYDROXIDE 400 MG/5ML PO SUSP: 30.0000 mL | Freq: Every day | ORAL | Status: DC | PRN

## 2021-08-25 MED ORDER — SODIUM CHLORIDE 0.9 % IV BOLUS
1000.0000 mL | Freq: Once | INTRAVENOUS | Status: AC
  Administered 2021-08-25: 1000 mL via INTRAVENOUS

## 2021-08-25 MED ORDER — BUPROPION HCL ER (XL) 150 MG PO TB24
300.0000 mg | ORAL_TABLET | Freq: Every day | ORAL | Status: DC
  Administered 2021-08-26: 300 mg via ORAL
  Filled 2021-08-25: qty 2

## 2021-08-25 MED ORDER — FAMOTIDINE 20 MG PO TABS
20.0000 mg | ORAL_TABLET | Freq: Every day | ORAL | Status: DC
  Administered 2021-08-26: 20 mg via ORAL
  Filled 2021-08-25: qty 1

## 2021-08-25 MED ORDER — ENOXAPARIN SODIUM 40 MG/0.4ML IJ SOSY: 40.0000 mg | PREFILLED_SYRINGE | INTRAMUSCULAR | Status: DC

## 2021-08-25 MED ORDER — IPRATROPIUM-ALBUTEROL 0.5-2.5 (3) MG/3ML IN SOLN
3.0000 mL | Freq: Four times a day (QID) | RESPIRATORY_TRACT | Status: DC
  Administered 2021-08-26: 3 mL via RESPIRATORY_TRACT
  Filled 2021-08-25: qty 3

## 2021-08-25 MED ORDER — ACETAMINOPHEN 325 MG PO TABS
650.0000 mg | ORAL_TABLET | Freq: Four times a day (QID) | ORAL | Status: DC | PRN
  Administered 2021-08-26: 650 mg via ORAL
  Filled 2021-08-25: qty 2

## 2021-08-25 MED ORDER — AZITHROMYCIN 500 MG PO TABS
500.0000 mg | ORAL_TABLET | Freq: Once | ORAL | Status: AC
  Administered 2021-08-25: 500 mg via ORAL
  Filled 2021-08-25: qty 1

## 2021-08-25 MED ORDER — IOHEXOL 350 MG/ML SOLN
100.0000 mL | Freq: Once | INTRAVENOUS | Status: AC | PRN
  Administered 2021-08-25: 75 mL via INTRAVENOUS

## 2021-08-25 MED ORDER — PREDNISONE 20 MG PO TABS: 40.0000 mg | ORAL_TABLET | Freq: Every day | ORAL | Status: DC

## 2021-08-25 MED ORDER — ACETAMINOPHEN 500 MG PO TABS
1000.0000 mg | ORAL_TABLET | Freq: Once | ORAL | Status: AC
  Administered 2021-08-25: 1000 mg via ORAL
  Filled 2021-08-25: qty 2

## 2021-08-25 MED ORDER — OXYBUTYNIN CHLORIDE 5 MG PO TABS
5.0000 mg | ORAL_TABLET | Freq: Two times a day (BID) | ORAL | Status: DC
  Administered 2021-08-25 – 2021-08-26 (×2): 5 mg via ORAL
  Filled 2021-08-25 (×3): qty 1

## 2021-08-25 MED ORDER — PREDNISONE 20 MG PO TABS
60.0000 mg | ORAL_TABLET | Freq: Once | ORAL | Status: AC
  Administered 2021-08-25: 60 mg via ORAL
  Filled 2021-08-25: qty 3

## 2021-08-25 MED ORDER — ACETAMINOPHEN 325 MG PO TABS
650.0000 mg | ORAL_TABLET | Freq: Once | ORAL | Status: AC | PRN
  Administered 2021-08-25: 650 mg via ORAL
  Filled 2021-08-25: qty 2

## 2021-08-25 MED ORDER — ALBUTEROL SULFATE (2.5 MG/3ML) 0.083% IN NEBU
2.5000 mg | INHALATION_SOLUTION | Freq: Once | RESPIRATORY_TRACT | Status: AC
  Administered 2021-08-25: 2.5 mg via RESPIRATORY_TRACT

## 2021-08-25 MED ORDER — INSULIN ASPART 100 UNIT/ML IJ SOLN
0.0000 [IU] | Freq: Three times a day (TID) | INTRAMUSCULAR | Status: DC
  Administered 2021-08-26 (×2): 4 [IU] via SUBCUTANEOUS
  Filled 2021-08-25 (×2): qty 1

## 2021-08-25 MED ORDER — GABAPENTIN 300 MG PO CAPS
300.0000 mg | ORAL_CAPSULE | Freq: Three times a day (TID) | ORAL | Status: DC
Start: 2021-08-25 — End: 2021-08-26
  Administered 2021-08-25 – 2021-08-26 (×2): 300 mg via ORAL
  Filled 2021-08-25 (×2): qty 1

## 2021-08-25 MED ORDER — HYDROCOD POLI-CHLORPHE POLI ER 10-8 MG/5ML PO SUER: 5.0000 mL | Freq: Two times a day (BID) | ORAL | Status: DC | PRN

## 2021-08-25 MED ORDER — SODIUM CHLORIDE 0.9 % IV SOLN: INTRAVENOUS | Status: DC

## 2021-08-25 MED ORDER — ONDANSETRON HCL 4 MG PO TABS: 4.0000 mg | ORAL_TABLET | Freq: Four times a day (QID) | ORAL | Status: DC | PRN

## 2021-08-25 MED ORDER — METHYLPREDNISOLONE SODIUM SUCC 40 MG IJ SOLR
40.0000 mg | Freq: Two times a day (BID) | INTRAMUSCULAR | Status: AC
  Administered 2021-08-25 – 2021-08-26 (×2): 40 mg via INTRAVENOUS
  Filled 2021-08-25 (×2): qty 1

## 2021-08-25 MED ORDER — ALBUTEROL SULFATE (2.5 MG/3ML) 0.083% IN NEBU: 2.5000 mg | INHALATION_SOLUTION | RESPIRATORY_TRACT | Status: DC | PRN

## 2021-08-25 MED ORDER — ONDANSETRON HCL 4 MG/2ML IJ SOLN
4.0000 mg | Freq: Four times a day (QID) | INTRAMUSCULAR | Status: DC | PRN
Start: 2021-08-25 — End: 2021-08-26

## 2021-08-25 MED ORDER — TRAZODONE HCL 50 MG PO TABS: 25.0000 mg | ORAL_TABLET | Freq: Every evening | ORAL | Status: DC | PRN

## 2021-08-25 MED ORDER — ATORVASTATIN CALCIUM 20 MG PO TABS
20.0000 mg | ORAL_TABLET | Freq: Every day | ORAL | Status: DC
  Administered 2021-08-25: 20 mg via ORAL
  Filled 2021-08-25: qty 1

## 2021-08-25 MED ORDER — IPRATROPIUM-ALBUTEROL 0.5-2.5 (3) MG/3ML IN SOLN: 3.0000 mL | RESPIRATORY_TRACT | Status: DC | PRN

## 2021-08-25 MED ORDER — ARIPIPRAZOLE 2 MG PO TABS
2.0000 mg | ORAL_TABLET | Freq: Every day | ORAL | Status: DC
  Administered 2021-08-26: 2 mg via ORAL
  Filled 2021-08-25: qty 1

## 2021-08-25 MED ORDER — SODIUM CHLORIDE 0.9 % IV SOLN
1.0000 g | INTRAVENOUS | Status: DC
  Administered 2021-08-25: 1 g via INTRAVENOUS
  Filled 2021-08-25: qty 1

## 2021-08-25 NOTE — Plan of Care (Signed)
  Problem: Activity: Goal: Ability to tolerate increased activity will improve Outcome: Progressing   Problem: Respiratory: Goal: Ability to maintain a clear airway will improve Outcome: Progressing   Problem: Skin Integrity: Goal: Risk for impaired skin integrity will decrease Outcome: Progressing   Problem: Pain Managment: Goal: General experience of comfort will improve Outcome: Progressing   Problem: Skin Integrity: Goal: Risk for impaired skin integrity will decrease Outcome: Progressing

## 2021-08-25 NOTE — Assessment & Plan Note (Addendum)
-   We will  place the patient on supplemental coverage with NovoLog. - We will hold off metformin.

## 2021-08-25 NOTE — H&P (Addendum)
[place]   PATIENT NAME: [name]    MR#:  [id number]  DATE OF BIRTH:  August 06, 1965  DATE OF ADMISSION:  08/25/2021  PRIMARY CARE PHYSICIAN: Jonathan Pouch, NP   Patient is coming from: Home  REQUESTING/REFERRING PHYSICIAN: Thereasa Distance, MD  CHIEF COMPLAINT:   Chief Complaint  Patient presents with   Shortness of Breath    HISTORY OF PRESENT ILLNESS:  [name]. is a 56 y.o. adult transgender male with medical history significant for COPD, type 2 diabetes mellitus, coronary artery disease, hypertension, dyslipidemia, OSA, PUD and peripheral neuropathy, who presented to the emergency room with acute onset of dyspnea and body aches since last Thursday with associated myalgia.  She has been having cough productive of clear sputum.  She admitted to sore throat and nasal congestion with associated headache.  She had vomiting today without diarrhea or melena or bright red meat rectum or abdominal pain.  She denies any bilious vomitus.  No dysuria, oliguria or hematuria or flank pain.  ED Course: Upon presentation to the emergency room, pulse oximetry was 93% on 3 L of O2 by nasal cannula otherwise vital signs were within normal.  Labs revealed unremarkable BMP.  CBC was within normal.  Lactic acid was 1.4 and later 1.6.  High-sensitivity troponin I was 7.  CBC was within normal. EKG as reviewed by me : EKG showed sinus tachycardia with a rate of 124 and right axis deviation. Imaging: Two-view chest x-ray showed no acute cardiopulmonary disease. Chest CTA revealed: 1. No convincing filling defect is identified in the pulmonary arterial tree to suggest pulmonary embolus. 2. Airway thickening is present, suggesting bronchitis or reactive airways disease. 3. Aortic Atherosclerosis (ICD10-I70.0) and Emphysema (ICD10-J43.9). 4. Suspected left ventricular hypertrophy. 5. Cholelithiasis. 6. Bilateral gynecomastia.  The patient was given 1 g p.o. Tylenol and  650 mg p.o. Tylenol, nebulized albuterol and 500 mg of p.o. Zithromax.  She was given 1 L bolus of IV normal saline, 1 g of IV Rocephin and 3 DuoNebs as well as 60 mg of p.o. prednisone.  The patient will be admitted to a medical l telemetry bed for further evaluation and management.  PAST MEDICAL HISTORY:   Past Medical History:  Diagnosis Date   Carpal tunnel syndrome    bilateral   COPD (chronic obstructive pulmonary disease) ([place])    Coronary artery disease    Diabetes mellitus without complication ([place])    Endotracheally intubated 09/30/2020   Hyperlipidemia    Hypertension    Neuropathy    On mechanically assisted ventilation ([place]) 09/30/2020   Peptic ulcer    Sleep apnea     PAST SURGICAL HISTORY:   Past Surgical History:  Procedure Laterality Date   COLONOSCOPY WITH PROPOFOL N/A 11/09/2016   Procedure: COLONOSCOPY WITH PROPOFOL;  Surgeon: Jonathan Sails, MD;  Location: [facility] ENDOSCOPY;  Service: Endoscopy;  Laterality: N/A;   COLONOSCOPY WITH PROPOFOL N/A 07/06/2017   Procedure: COLONOSCOPY WITH PROPOFOL;  Surgeon: Jonathan Sails, MD;  Location: [facility] ENDOSCOPY;  Service: Endoscopy;  Laterality: N/A;   ESOPHAGOGASTRODUODENOSCOPY N/A 05/15/2021   Procedure: ESOPHAGOGASTRODUODENOSCOPY (EGD);  Surgeon: Jonathan Helling, DO;  Location: [facility] ENDOSCOPY;  Service: Gastroenterology;  Laterality: N/A;  DM   ESOPHAGOGASTRODUODENOSCOPY (EGD) WITH PROPOFOL N/A 11/09/2016   Procedure: ESOPHAGOGASTRODUODENOSCOPY (EGD) WITH PROPOFOL;  Surgeon: Jonathan Sails, MD;  Location: [facility] ENDOSCOPY;  Service: Endoscopy;  Laterality: N/A;   ESOPHAGOGASTRODUODENOSCOPY (EGD) WITH PROPOFOL N/A 07/06/2017   Procedure:  ESOPHAGOGASTRODUODENOSCOPY (EGD) WITH PROPOFOL;  Surgeon: Jonathan Sails, MD;  Location: [facility] ENDOSCOPY;  Service: Endoscopy;  Laterality: N/A;    SOCIAL HISTORY:   Social History   Tobacco Use   Smoking status: Every Day    Packs/day: 0.75    Years: 30.00    Total  pack years: 22.50    Types: Cigarettes    Last attempt to quit: 08/13/2019    Years since quitting: 2.0   Smokeless tobacco: Never   Tobacco comments:    trying decrease  Substance Use Topics   Alcohol use: No    FAMILY HISTORY:   Family History  Problem Relation Age of Onset   Diabetes Mother    Diabetes Father    Stomach cancer Father    Heart attack Father    Alcohol abuse Father    Heart attack Sister    Heart attack Brother    Heart attack Paternal Aunt    Heart attack Paternal Uncle     DRUG ALLERGIES:   Allergies  Allergen Reactions   Shellfish Allergy Swelling   Codeine Rash   Lisinopril Cough    REVIEW OF SYSTEMS:   ROS As per history of present illness. All pertinent systems were reviewed above. Constitutional, HEENT, cardiovascular, respiratory, GI, GU, musculoskeletal, neuro, psychiatric, endocrine, integumentary and hematologic systems were reviewed and are otherwise negative/unremarkable except for positive findings mentioned above in the HPI.   MEDICATIONS AT HOME:   Prior to Admission medications   Medication Sig Start Date End Date Taking? Authorizing Provider  albuterol (VENTOLIN HFA) 108 (90 Base) MCG/ACT inhaler Inhale 2 puffs into the lungs every 4 (four) hours as needed for wheezing or shortness of breath.     [provider]  ARIPiprazole (ABILIFY) 2 MG tablet Take 1 tablet (2 mg total) by mouth daily. 12/31/20 01/30/21  Jonathan Clay, MD  aspirin-acetaminophen-caffeine (EXCEDRIN MIGRAINE) [phone] MG tablet Take by mouth every 6 (six) hours as needed for headache.    [provider]  atorvastatin (LIPITOR) 20 MG tablet Take 20 mg by mouth at bedtime.     [provider]  buPROPion (WELLBUTRIN XL) 300 MG 24 hr tablet Take 1 tablet (300 mg total) by mouth daily. 01/02/21 02/01/21  Jonathan Clay, MD  Calcium-Magnesium-Vitamin D (CALCIUM 1200+D3 PO) Take by mouth. Patient not taking: Reported on 03/21/2021    [provider]  clindamycin (CLEOCIN T) 1 % external solution APPLY TO AFFECTED AREA RASH ON BUTTOCKS1 TIME A WEEK ON SUNDAYS 07/16/20   Jonathan Bathe, MD  escitalopram (LEXAPRO) 20 MG tablet Take 20 mg by mouth daily. 10/06/19 09/29/20  [provider]  estradiol (ESTRACE) 2 MG tablet Take by mouth. 03/13/20   [provider]  famotidine (PEPCID) 20 MG tablet Take 1 tablet (20 mg total) by mouth daily. 01/19/21 01/19/22  Cuthriell, Charline Bills, PA-C  gabapentin (NEURONTIN) 300 MG capsule Take 300 mg by mouth 3 (three) times daily.  09/20/18   [provider]  GI Cocktail (alum & mag hydroxide-simethicone/lidocaine)oral mixture Take 15 mLs by mouth 2 (two) times daily as needed (Epigastric abdominal pain). 01/19/21   Cuthriell, Charline Bills, PA-C  hydrochlorothiazide (MICROZIDE) 12.5 MG capsule Take 12.5 mg by mouth daily.    [provider]  hydrocortisone 2.5 % lotion APPLY TO AFFECTEDAREA RASH ON BUTTOCKS 3 DAYS PER WEEK. TUESDAY, THURSDAY, SATURDAY, AND AS NEEDED FOR FLARES 07/16/20   Jonathan Bathe, MD  ketoconazole (NIZORAL) 2 % cream Apply to a  rash on buttock 3 days a week. On Monday, Wednesday and Friday 04/19/20   [provider]  loratadine (CLARITIN) 10 MG tablet Take 10 mg by mouth daily.    [provider]  meloxicam (MOBIC) 15 MG tablet Take 1 tablet (15 mg total) by mouth daily. 11/15/19 10/02/20  Milinda Pointer, MD  metFORMIN (GLUCOPHAGE) 1000 MG tablet Take 1,000 mg by mouth 2 (two) times daily with a meal.    [provider]  oxybutynin (DITROPAN) 5 MG tablet Take 5 mg by mouth every 12 (twelve) hours.    [provider]  OZEMPIC, 0.25 OR 0.5 MG/DOSE, 2 MG/1.5ML SOPN Inject 0.5 mg into the skin once a week. 04/15/20   [provider]  pantoprazole (PROTONIX) 40 MG tablet Take 1 tablet (40 mg total) by mouth daily. 01/19/21 01/19/22  Cuthriell, Charline Bills, PA-C  simethicone (MYLICON) 315 MG chewable tablet Chew 125 mg by  mouth every 6 (six) hours as needed for flatulence.    [provider]  spironolactone (ALDACTONE) 50 MG tablet Take 50 mg by mouth 2 (two) times daily. 03/13/20   [provider]  tiZANidine (ZANAFLEX) 4 MG tablet Take 1 tablet (4 mg total) by mouth every 8 (eight) hours as needed for muscle spasms. 11/15/19 10/02/20  Milinda Pointer, MD  TRELEGY ELLIPTA 100-62.5-25 MCG/INH AEPB Inhale 1 puff into the lungs daily. 09/20/20   [provider]  triamcinolone ointment (KENALOG) 0.1 % APPLY AT BEDTIME EVERYDAY TO FEET AND LOWER LEGS. AVOID FACE,GROIN, AND AXILLA 07/16/20   Jonathan Bathe, MD  TRUEPLUS 5-BEVEL PEN NEEDLES 31G X 6 MM [place]  12/28/18   [provider]  umeclidinium-vilanterol (ANORO ELLIPTA) 62.5-25 MCG/INH AEPB Inhale into the lungs. 10/06/19   [provider]      VITAL SIGNS:  Blood pressure 135/73, pulse (!) 101, temperature 99 F (37.2 C), resp. rate 20, height '5\' 8"'$  (1.727 m), weight 133.4 kg, SpO2 96 %.  PHYSICAL EXAMINATION:  Physical Exam  GENERAL:  56 y.o.-year-old Caucasian transgender male patient lying in the bed with no acute distress.  EYES: Pupils equal, round, reactive to light and accommodation. No scleral icterus. Extraocular muscles intact.  HEENT: Head atraumatic, normocephalic. Oropharynx and nasopharynx clear.  NECK:  Supple, no jugular venous distention. No thyroid enlargement, no tenderness.  LUNGS: Diffuse expiratory wheezes with diminished expiratory airflow and harsh vesicular breathing.. No use of accessory muscles of respiration.  CARDIOVASCULAR: Regular rate and rhythm, S1, S2 normal. No murmurs, rubs, or gallops.  ABDOMEN: Soft, nondistended, nontender. Bowel sounds present. No organomegaly or mass.  EXTREMITIES: No pedal edema, cyanosis, or clubbing.  NEUROLOGIC: Cranial nerves II through XII are intact. Muscle strength 5/5 in all extremities. Sensation intact. Gait not checked.  PSYCHIATRIC: The patient  is alert and oriented x 3.  Normal affect and good eye contact. SKIN: No obvious rash, lesion, or ulcer.   LABORATORY PANEL:   CBC Recent Labs  Lab 08/25/21 1141  WBC 10.1  HGB 14.5  HCT 44.7  PLT 325   ------------------------------------------------------------------------------------------------------------------  Chemistries  Recent Labs  Lab 08/25/21 1141  NA 135  K 4.6  CL 103  CO2 23  GLUCOSE 117*  BUN 18  CREATININE 1.23  CALCIUM 9.7   ------------------------------------------------------------------------------------------------------------------  Cardiac Enzymes No results for input(s): "TROPONINI" in the last 168 hours. ------------------------------------------------------------------------------------------------------------------  RADIOLOGY:  CT Angio Chest PE W and/or Wo Contrast  Result Date: 08/25/2021 CLINICAL DATA:  Pulmonary embolus EXAM: CT ANGIOGRAPHY CHEST WITH  CONTRAST TECHNIQUE: Multidetector CT imaging of the chest was performed using the standard protocol during bolus administration of intravenous contrast. Multiplanar CT image reconstructions and MIPs were obtained to evaluate the vascular anatomy. RADIATION DOSE REDUCTION: This exam was performed according to the departmental dose-optimization program which includes automated exposure control, adjustment of the mA and/or kV according to patient size and/or use of iterative reconstruction technique. CONTRAST:  51m OMNIPAQUE IOHEXOL 350 MG/ML SOLN COMPARISON:  Chest radiograph 08/25/2021 FINDINGS: Cardiovascular: No convincing filling defect is identified in the pulmonary arterial tree to indicate pulmonary embolus. There is some hypodense artifact in the left pulmonary artery on image 196 of series 6. Atherosclerotic calcification of the thoracic aorta noted. Interventricular septal wall thickening up to 2.1 cm, raising the possibility of left ventricular hypertrophy. Mediastinum/Nodes: No  pathologic adenopathy observed. Lungs/Pleura: Paraseptal emphysema. Moderate bilateral airway thickening. Upper Abdomen: Multiple gallstones measuring up to 2.1 cm in long axis. No common hepatic duct or common bile duct dilatation. Abdominal aortic atherosclerosis. Musculoskeletal: Bilateral gynecomastia. Lower cervical and upper thoracic anterior interbody spurring. Review of the MIP images confirms the above findings. IMPRESSION: 1. No convincing filling defect is identified in the pulmonary arterial tree to suggest pulmonary embolus. 2. Airway thickening is present, suggesting bronchitis or reactive airways disease. 3. Aortic Atherosclerosis (ICD10-I70.0) and Emphysema (ICD10-J43.9). 4. Suspected left ventricular hypertrophy. 5. Cholelithiasis. 6. Bilateral gynecomastia. Electronically Signed   By: WVan ClinesM.D.   On: 08/25/2021 19:40   DG Chest 2 View  Result Date: 08/25/2021 CLINICAL DATA:  Chest pain. EXAM: CHEST - 2 VIEW COMPARISON:  09/29/2020 FINDINGS: The heart size and mediastinal contours are within normal limits. Both lungs are clear. No visible pleural effusions or pneumothorax. No acute osseous abnormality. IMPRESSION: No active cardiopulmonary disease. Electronically Signed   By: FMargaretha SheffieldM.D.   On: 08/25/2021 13:42      IMPRESSION AND PLAN:  Assessment and Plan: * COPD exacerbation ([place] - The patient will be admitted to a medical telemetry bed. - We will continue steroid therapy with IV Solu-Medrol. - We will continue bronchodilator therapy with DuoNebs 4 times daily and every 4 hours as needed. - We will place the patient on mucolytic therapy. - We will hold off long acting beta agonist.  Acute respiratory failure with hypoxia (HCC) - The patient is not on oxygen at home and is currently requiring 3 L O2 by nasal cannula. - This is clearly secondary to COPD exacerbation - O2 protocol will be followed. - Management otherwise as above and will monitor pulse  oximetry.  Type 2 diabetes mellitus with peripheral neuropathy (HCC) - The patient will be placed on supplemental coverage with NovoLog. - We will hold off metformin. - We will continue Neurontin.  GERD without esophagitis - We will continue H2 blocker therapy and PPI therapy.  Dyslipidemia - We will continue statin therapy.  Depression - We will continue Wellbutrin XL and Lexapro.  Essential hypertension - We will continue her antihypertensives.   DVT prophylaxis: Lovenox.  Advanced Care Planning:  Code Status: full code.  Family Communication:  The plan of care was discussed in details with the patient (and family). I answered all questions. The patient agreed to proceed with the above mentioned plan. Further management will depend upon hospital course. Disposition Plan: Back to previous home environment Consults called: none.  All the records are reviewed and case discussed with ED provider.  Status is: Inpatient   At the time of the admission, it appears  that the appropriate admission status for this patient is inpatient.  This is judged to be reasonable and necessary in order to provide the required intensity of service to ensure the patient's safety given the presenting symptoms, physical exam findings and initial radiographic and laboratory data in the context of comorbid conditions.  The patient requires inpatient status due to high intensity of service, high risk of further deterioration and high frequency of surveillance required.  I certify that at the time of admission, it is my clinical judgment that the patient will require inpatient hospital care extending more than 2 midnights.                            Dispo: The patient is from: Home              Anticipated d/c is to: Home              Patient currently is not medically stable to d/c.              Difficult to place patient: No  Christel Mormon M.D on 08/25/2021 at 9:54 PM  [facility]   From 7 PM-7  AM, contact night-coverage [email or web address]  CC: Primary care physician; Jonathan Pouch, NP

## 2021-08-25 NOTE — Assessment & Plan Note (Signed)
-   We will continue her antihypertensives. 

## 2021-08-25 NOTE — Assessment & Plan Note (Addendum)
-   We will continue H2 blocker therapy and PPI therapy.

## 2021-08-25 NOTE — Assessment & Plan Note (Signed)
-   We will continue statin therapy. 

## 2021-08-25 NOTE — Assessment & Plan Note (Addendum)
-   We will continue Wellbutrin XL and Lexapro.

## 2021-08-25 NOTE — Assessment & Plan Note (Addendum)
-   The patient will be admitted to a medical telemetry bed. - We will continue steroid therapy with IV Solu-Medrol. - We will continue bronchodilator therapy with DuoNebs 4 times daily and every 4 hours as needed. - We will place the patient on mucolytic therapy. - We will hold off long acting beta agonist.

## 2021-08-25 NOTE — Assessment & Plan Note (Addendum)
-   The patient will be placed on supplemental coverage with NovoLog. - We will hold off metformin. - We will continue Neurontin.

## 2021-08-25 NOTE — Assessment & Plan Note (Addendum)
-   The patient is not on oxygen at home and is currently requiring 3 L O2 by nasal cannula. - This is clearly secondary to COPD exacerbation - O2 protocol will be followed. - Management otherwise as above and will monitor pulse oximetry.

## 2021-08-25 NOTE — Progress Notes (Signed)
PHARMACIST - PHYSICIAN COMMUNICATION  CONCERNING:  Enoxaparin (Lovenox) for DVT Prophylaxis    RECOMMENDATION: Patient was prescribed enoxaprin '40mg'$  q24 hours for VTE prophylaxis.   Filed Weights   08/25/21 1138  Weight: 133.4 kg (294 lb)    Body mass index is 44.7 kg/m.  Estimated Creatinine Clearance (by C-G formula based on SCr of 1.23 mg/dL) Male: 73.9 mL/min Male: 89.5 mL/min   Based on [place] policy patient is candidate for enoxaparin 0.'5mg'$ /kg TBW SQ every 24 hours based on BMI being >30.  DESCRIPTION: Pharmacy has adjusted enoxaparin dose per [facility] policy.  Patient is now receiving enoxaparin 67.5 mg every 24 hours    Vira Blanco, PharmD Clinical Pharmacist  08/25/2021 7:26 PM

## 2021-08-25 NOTE — ED Triage Notes (Signed)
Pt c/o SOB that started this AM and has progressively gotten worse. HX COPD

## 2021-08-25 NOTE — ED Triage Notes (Addendum)
Pt to ED via POV from home. Pt started feeling bad on Thursday. Pt reports SOB, vomiting, weakness and epigastric pain. Pt labored and tachypneac in triage.

## 2021-08-25 NOTE — ED Provider Notes (Signed)
[facility] Provider Note    Event Date/Time   First MD Initiated Contact with Patient 08/25/21 1502     (approximate)   History   Shortness of Breath   HPI  [name]. is a 56 y.o. adult   with past medical history of COPD, coronary disease, diabetes who presents with shortness of breath and body aches.  Patient has been sick since last Thursday, for approximately 5 days.  He endorses diffuse body aches and joint pain, no area on the body specifically worse than others.  Also endorses sore throat nasal congestion and headache.  Started feeling short of breath today.  Cough is productive of clear sputum.  Denies hemoptysis.  He denies abdominal pain but did start vomiting today denies diarrhea or abdominal pain no measured fevers at home.  No sick contacts.  The patient denies hx of prior DVT/PE, unilateral leg pain/swelling, hormone use, recent surgery, hx of cancer, prolonged immobilization, or hemoptysis.       Past Medical History:  Diagnosis Date   Carpal tunnel syndrome    bilateral   COPD (chronic obstructive pulmonary disease) (HCC)    Coronary artery disease    Diabetes mellitus without complication ([place])    Endotracheally intubated 09/30/2020   Hyperlipidemia    Hypertension    Neuropathy    On mechanically assisted ventilation ([place]) 09/30/2020   Peptic ulcer    Sleep apnea     Patient Active Problem List   Diagnosis Date Noted   Overdose 09/30/2020   Suicidal ideation 09/30/2020   Acute on chronic respiratory failure with hypoxia ([place]) 09/30/2020   AKI (acute kidney injury) ([place]) 09/30/2020   Acute encephalopathy 09/30/2020   Lactic acidosis 09/30/2020   Depression 09/30/2020   Venous stasis dermatitis of both lower extremities 08/17/2019   Pain due to onychomycosis of toenails of both feet 05/15/2019   Chronic low back pain (1ry area of Pain) (Bilateral) w/o sciatica 03/09/2019   Bilateral lower leg cellulitis 06/27/2018    Osteoarthritis of facet joint of lumbar spine 02/14/2018   Osteoarthritis involving multiple joints 02/14/2018   Chronic musculoskeletal pain 02/14/2018   Neurogenic pain 02/14/2018   Abnormal MRI, lumbar spine 02/14/2018   History of allergy to shellfish 01/27/2018   Morbid obesity with BMI of 45.0-49.9, adult ([place]) 12/13/2017   Swelling of limb 11/26/2017   Lower limb ulcer, calf, left, limited to breakdown of skin ([place]) 11/26/2017   DM2 (diabetes mellitus, type 2) ([place]) 11/03/2017   Neurogenic bladder 11/03/2017   Spondylosis without myelopathy or radiculopathy, lumbar region 11/03/2017   Lumbar facet syndrome (Bilateral) (R>L) 11/03/2017   DDD (degenerative disc disease), lumbar 11/02/2017   Lumbar facet hypertrophy (Bilateral) 11/02/2017   Lumbar central spinal stenosis (L4-5) 11/02/2017   Lumbar lateral recess stenosis (L4-5) (Bilateral) (L>R) 11/02/2017   Vitamin D insufficiency 11/02/2017   Lumbar spondylosis 11/02/2017   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   Grief 10/18/2017   Chronic low back pain (Bilateral) (R>L) w/ sciatica (Bilateral) 10/07/2017   Chronic lower extremity pain (Secondary Area of Pain) (Bilateral) (R>L) 10/07/2017   Chronic pain syndrome 10/07/2017   Pharmacologic therapy 10/07/2017   Disorder of skeletal system 10/07/2017   Problems influencing health status 10/07/2017   Barrett's esophagus without dysplasia 03/10/2017   Colon cancer screening 10/14/2016   Essential hypertension, benign 02/11/2016   Hyperlipidemia 02/11/2016   Tobacco dependence 02/11/2016   Obesity 02/11/2016   Lower limb ulcer,  ankle, left, with fat layer exposed ([place]) 02/11/2016   Lymphedema 02/11/2016   Cellulitis 01/06/2016     Physical Exam  Triage Vital Signs: ED Triage Vitals  Enc Vitals Group     BP 08/25/21 1145 (!) 133/56     Pulse Rate 08/25/21 1145 (!) 115     Resp 08/25/21 1145 (!) 26     Temp 08/25/21 1146 (!) 101  F (38.3 C)     Temp Source 08/25/21 1146 Oral     SpO2 08/25/21 1145 92 %     Weight 08/25/21 1138 294 lb (133.4 kg)     Height 08/25/21 1138 '5\' 8"'$  (1.727 m)     Head Circumference --      Peak Flow --      Pain Score 08/25/21 1138 10     Pain Loc --      Pain Edu? --      Excl. in [place]? --     Most recent vital signs: Vitals:   08/25/21 1622 08/25/21 1800  BP: 125/78 124/74  Pulse: (!) 105 (!) 102  Resp: (!) 26 (!) 23  Temp: 99.5 F (37.5 C) 98.1 F (36.7 C)  SpO2: 95% 96%     General: Awake, no distress.  CV:  Good peripheral perfusion.  Venous stasis changes with woody edema in the bilateral lower extremities nonpitting Resp:  Normal effort.  Patient's lungs are clear but he is tachypneic Abd:  No distention.  Abdomen soft nontender throughout Neuro:             Awake, Alert, Oriented x 3  Other:     ED Results / Procedures / Treatments  Labs (all labs ordered are listed, but only abnormal results are displayed) Labs Reviewed  BASIC METABOLIC PANEL - Abnormal; Notable for the following components:      Result Value   Glucose, Bld 117 (*)    All other components within normal limits  RESP PANEL BY RT-PCR (FLU A&B, COVID) ARPGX2  RESPIRATORY PANEL BY PCR  CBC  LACTIC ACID, PLASMA  LACTIC ACID, PLASMA  PROCALCITONIN  TROPONIN I (HIGH SENSITIVITY)  TROPONIN I (HIGH SENSITIVITY)     EKG  EKG interpreted by myself shows sinus tachycardia with right axis deviation normal intervals no acute ischemic changes   RADIOLOGY I reviewed and interpreted the CXR which does not show any acute cardiopulmonary process    PROCEDURES:  Critical Care performed: No  .1-3 Lead EKG Interpretation  Performed by: Rada Hay, MD Authorized by: Rada Hay, MD     Interpretation: abnormal     ECG rate assessment: tachycardic     Rhythm: sinus tachycardia     Ectopy: none     Conduction: normal     The patient is on the cardiac monitor to evaluate for  evidence of arrhythmia and/or significant heart rate changes.   MEDICATIONS ORDERED IN ED: Medications  acetaminophen (TYLENOL) tablet 650 mg (650 mg Oral Given 08/25/21 1151)  sodium chloride 0.9 % bolus 1,000 mL (0 mLs Intravenous Stopped 08/25/21 1622)  acetaminophen (TYLENOL) tablet 1,000 mg (1,000 mg Oral Given 08/25/21 1522)  ipratropium-albuterol (DUONEB) 0.5-2.5 (3) MG/3ML nebulizer solution 3 mL (3 mLs Nebulization Given 08/25/21 1522)  ipratropium-albuterol (DUONEB) 0.5-2.5 (3) MG/3ML nebulizer solution 3 mL (3 mLs Nebulization Given 08/25/21 1700)  ipratropium-albuterol (DUONEB) 0.5-2.5 (3) MG/3ML nebulizer solution 3 mL (3 mLs Nebulization Given 08/25/21 1700)  predniSONE (DELTASONE) tablet 60 mg (60 mg Oral Given 08/25/21 1700)  albuterol (PROVENTIL) (2.5 MG/3ML) 0.083% nebulizer solution 2.5 mg (2.5 mg Nebulization Given 08/25/21 1844)  azithromycin (ZITHROMAX) tablet 500 mg (500 mg Oral Given 08/25/21 1846)     IMPRESSION / MDM / ASSESSMENT AND PLAN / ED COURSE  I reviewed the triage vital signs and the nursing notes.                              Patient's presentation is most consistent with acute presentation with potential threat to life or bodily function.  Differential diagnosis includes, but is not limited to, viral illness including COVID-19, influenza, bacterial pneumonia, pulmonary embolism  The patient is a 56 year old male who has a history of underlying COPD presents with body aches shortness of breath.  He has been feeling sick for about 5 days primarily with body aches now with nasal congestion sore throat headache and shortness of breath.  He has also have a cough that is productive of clear sputum.  Denies chest pain other than with coughing.  He is mildly tachycardic febrile to 101 and tachypneic to 38.  On my evaluation patient overall is nontoxic but he is tachypneic lungs sound clear he has some chronic venous stasis in the lower extremity does not appear with  acutely volume overloaded.  Chest x-ray does not show any obvious lobar infiltrate.  Labs overall reassuring with no leukocytosis normal lactate normal BNP.  Plan to check procalcitonin respiratory viral panel give liter fluids and Tylenol and reassess.  If patient still significantly tachypneic will need additional cross-sectional imaging of the chest potentially CTA.  No risk factors for pulmonary embolism with fever and other systemic symptoms my  After 1 DuoNeb patient's lung exam does have more significant wheezing and I suspect that the bronchospasm is having a larger contribution to his hypoxia than I initially thought based on his exam.  He was given additional 2 DuoNebs and prednisone.  He is COVID negative.  AK-Tate is negative he has no leukocytosis procalcitonin less than <0.1.   Repeat assessment after DuoNebs patient is satting 89% so placed on 2 L nasal cannula.  Does continue to have wheezing will give more albuterol.  Will give azithromycin for COPD exacerbation.  Will admit to the hospital service.      FINAL CLINICAL IMPRESSION(S) / ED DIAGNOSES   Final diagnoses:  COPD exacerbation ([place])  Hypoxia  SOB (shortness of breath)     Rx / DC Orders   ED Discharge Orders     None        Note:  This document was prepared using Dragon voice recognition software and may include unintentional dictation errors.   Rada Hay, MD 08/25/21 [phone]

## 2021-08-26 DIAGNOSIS — J441 Chronic obstructive pulmonary disease with (acute) exacerbation: Secondary | ICD-10-CM | POA: Diagnosis present

## 2021-08-26 LAB — CBC
HCT: 41.9 % (ref 39.0–52.0)
Hemoglobin: 13.4 g/dL (ref 13.0–17.0)
MCH: 26.6 pg (ref 26.0–34.0)
MCHC: 32 g/dL (ref 30.0–36.0)
MCV: 83.3 fL (ref 80.0–100.0)
Platelets: 293 10*3/uL (ref 150–400)
RBC: 5.03 MIL/uL (ref 4.22–5.81)
RDW: 14.3 % (ref 11.5–15.5)
WBC: 6.8 10*3/uL (ref 4.0–10.5)
nRBC: 0 % (ref 0.0–0.2)

## 2021-08-26 LAB — GLUCOSE, CAPILLARY
Glucose-Capillary: 156 mg/dL — ABNORMAL HIGH (ref 70–99)
Glucose-Capillary: 190 mg/dL — ABNORMAL HIGH (ref 70–99)
Glucose-Capillary: 189 mg/dL — ABNORMAL HIGH (ref 70–99)

## 2021-08-26 LAB — BASIC METABOLIC PANEL
Anion gap: 9 (ref 5–15)
BUN: 18 mg/dL (ref 6–20)
CO2: 23 mmol/L (ref 22–32)
Calcium: 9.1 mg/dL (ref 8.9–10.3)
Chloride: 103 mmol/L (ref 98–111)
Creatinine, Ser: 0.99 mg/dL (ref 0.61–1.24)
GFR, Estimated: >60 mL/min (ref >60)
Glucose, Bld: 172 mg/dL — ABNORMAL HIGH (ref 70–99)
Potassium: 4.6 mmol/L (ref 3.5–5.1)
Sodium: 135 mmol/L (ref 135–145)

## 2021-08-26 LAB — HEMOGLOBIN A1C
Hgb A1c MFr Bld: 5.6 % (ref 4.8–5.6)
Mean Plasma Glucose: 114.02 mg/dL

## 2021-08-26 MED ORDER — SIMETHICONE 80 MG PO CHEW
80.0000 mg | CHEWABLE_TABLET | Freq: Four times a day (QID) | ORAL | Status: DC | PRN
Start: 2021-08-26 — End: 2021-08-26
  Administered 2021-08-26: 80 mg via ORAL
  Filled 2021-08-26 (×2): qty 1

## 2021-08-26 MED ORDER — AZITHROMYCIN 250 MG PO TABS
250.0000 mg | ORAL_TABLET | Freq: Every day | ORAL | Status: DC
Start: 2021-08-26 — End: 2021-08-26
  Administered 2021-08-26: 250 mg via ORAL
  Filled 2021-08-26: qty 1

## 2021-08-26 MED ORDER — AZITHROMYCIN 250 MG PO TABS: ORAL_TABLET | ORAL | 0 refills | Status: AC

## 2021-08-26 MED ORDER — STERILE WATER FOR INJECTION IJ SOLN
INTRAMUSCULAR | Status: AC
  Filled 2021-08-26: qty 10

## 2021-08-26 MED ORDER — IPRATROPIUM-ALBUTEROL 0.5-2.5 (3) MG/3ML IN SOLN
3.0000 mL | Freq: Three times a day (TID) | RESPIRATORY_TRACT | Status: DC
  Administered 2021-08-26: 3 mL via RESPIRATORY_TRACT
  Filled 2021-08-26: qty 3

## 2021-08-26 MED ORDER — UMECLIDINIUM BROMIDE 62.5 MCG/ACT IN AEPB
1.0000 | INHALATION_SPRAY | Freq: Every day | RESPIRATORY_TRACT | Status: DC
Start: 2021-08-26 — End: 2021-08-26
  Filled 2021-08-26: qty 7

## 2021-08-26 MED ORDER — GUAIFENESIN ER 600 MG PO TB12: 600.0000 mg | ORAL_TABLET | Freq: Two times a day (BID) | ORAL | 0 refills | Status: AC

## 2021-08-26 MED ORDER — PREDNISONE 20 MG PO TABS: 40.0000 mg | ORAL_TABLET | Freq: Every day | ORAL | 0 refills | Status: AC

## 2021-08-26 MED ORDER — FLUTICASONE FUROATE-VILANTEROL 100-25 MCG/ACT IN AEPB
1.0000 | INHALATION_SPRAY | Freq: Every day | RESPIRATORY_TRACT | Status: DC
  Filled 2021-08-26: qty 28

## 2021-08-26 MED ORDER — GABAPENTIN 300 MG PO CAPS
600.0000 mg | ORAL_CAPSULE | Freq: Three times a day (TID) | ORAL | Status: DC
  Administered 2021-08-26: 600 mg via ORAL
  Filled 2021-08-26: qty 2

## 2021-08-26 NOTE — Discharge Summary (Signed)
Physician Discharge Summary   Patient: [name]. MRN: [id number] DOB: December 22, 1965  Admit date:     08/25/2021  Discharge date: 08/26/2021  Discharge Physician: Ezekiel Slocumb   PCP: Howard Pouch, NP   Recommendations at discharge:   Follow up with Pulmonology Follow up with Primary Care Continue support and encouragement for smoking cessation  Discharge Diagnoses: Principal Problem:   COPD exacerbation ([place]) Active Problems:   Acute respiratory failure with hypoxia ([place])   Essential hypertension   Depression   Dyslipidemia   GERD without esophagitis   Type 2 diabetes mellitus with peripheral neuropathy (HCC)   COPD with acute exacerbation (HCC)  Resolved Problems:   * No resolved hospital problems. [facility] Course: HPI on admission:  "[name]. [name]") is a 56 y.o. adult transgender male (she/her pronouns) with medical history significant for COPD, type 2 diabetes mellitus, coronary artery disease, hypertension, dyslipidemia, OSA, PUD and peripheral neuropathy, who presented to the emergency room with acute onset of dyspnea and body aches since last Thursday with associated myalgia.  She has been having cough productive of clear sputum.  She admitted to sore throat and nasal congestion with associated headache.  She had vomiting today without diarrhea or melena or bright red meat rectum or abdominal pain.  She denies any bilious vomitus.  No dysuria, oliguria or hematuria or flank pain.   ED Course: Upon presentation to the emergency room, pulse oximetry was 93% on 3 L of O2 by nasal cannula otherwise vital signs were within normal.  Labs revealed unremarkable BMP.  CBC was within normal.  Lactic acid was 1.4 and later 1.6.  High-sensitivity troponin I was 7.  CBC was within normal. EKG as reviewed by me : EKG showed sinus tachycardia with a rate of 124 and right axis deviation. Imaging: Two-view chest x-ray showed no acute cardiopulmonary  disease. Chest CTA revealed: 1. No convincing filling defect is identified in the pulmonary arterial tree to suggest pulmonary embolus. 2. Airway thickening is present, suggesting bronchitis or reactive airways disease. 3. Aortic Atherosclerosis (ICD10-I70.0) and Emphysema (ICD10-J43.9). 4. Suspected left ventricular hypertrophy. 5. Cholelithiasis. 6. Bilateral gynecomastia.   The patient was given 1 g p.o. Tylenol and 650 mg p.o. Tylenol, nebulized albuterol and 500 mg of p.o. Zithromax.  She was given 1 L bolus of IV normal saline, 1 g of IV Rocephin and 3 DuoNebs as well as 60 mg of p.o. prednisone.  The patient will be admitted to a medical l telemetry bed for further evaluation and management."  Admitted to hospital and continued on steroids, nebulizer treatments.  She was weaned off oxygen and sats stable both at rest and with ambulation.   8/15-- Patient is clinically improved and stable, requests to be discharged home today.  Agreeable to close PCP and pulmonology follow up.      Assessment and Plan: * COPD exacerbation ([place]) - The patient will be admitted to a medical telemetry bed. - We will continue steroid therapy with IV Solu-Medrol. - We will continue bronchodilator therapy with DuoNebs 4 times daily and every 4 hours as needed. - We will place the patient on mucolytic therapy. - We will hold off long acting beta agonist.  Acute respiratory failure with hypoxia (HCC) - The patient is not on oxygen at home and is currently requiring 3 L O2 by nasal cannula. - This is clearly secondary to COPD exacerbation - O2 protocol will be followed. - Management otherwise as above  and will monitor pulse oximetry.  Type 2 diabetes mellitus with peripheral neuropathy (HCC) - The patient will be placed on supplemental coverage with NovoLog. - We will hold off metformin. - We will continue Neurontin.  GERD without esophagitis - We will continue H2 blocker therapy and PPI  therapy.  Dyslipidemia - We will continue statin therapy.  Depression - We will continue Wellbutrin XL and Lexapro.  Essential hypertension - We will continue her antihypertensives.         Consultants: none Procedures performed: none  Disposition: Home Diet recommendation:  Discharge Diet Orders (From admission, onward)     Start     Ordered   08/26/21 0000  Diet - low sodium heart healthy        08/26/21 1524           Regular diet DISCHARGE MEDICATION: Allergies as of 08/26/2021       Reactions   Shellfish Allergy Swelling   Codeine Rash   Lisinopril Cough        Medication List     STOP taking these medications    Anoro Ellipta 62.5-25 MCG/ACT Aepb Generic drug: umeclidinium-vilanterol   CALCIUM 1200+D3 PO   clindamycin 1 % external solution Commonly known as: CLEOCIN T   GI Cocktail (alum & mag hydroxide-simethicone/lidocaine)oral mixture   ketoconazole 2 % cream Commonly known as: NIZORAL   Ozempic (0.25 or 0.5 MG/DOSE) 2 MG/1.5ML Sopn Generic drug: Semaglutide(0.25 or 0.'5MG'$ /DOS)   triamcinolone ointment 0.1 % Commonly known as: KENALOG       TAKE these medications    albuterol 108 (90 Base) MCG/ACT inhaler Commonly known as: VENTOLIN HFA Inhale 2 puffs into the lungs every 4 (four) hours as needed for wheezing or shortness of breath.   ARIPiprazole 2 MG tablet Commonly known as: ABILIFY Take 1 tablet (2 mg total) by mouth daily.   aspirin-acetaminophen-caffeine 937-902-40 MG tablet Commonly known as: EXCEDRIN MIGRAINE Take by mouth every 6 (six) hours as needed for headache.   atorvastatin 40 MG tablet Commonly known as: LIPITOR Take 40 mg by mouth daily. What changed: Another medication with the same name was removed. Continue taking this medication, and follow the directions you see here.   azithromycin 250 MG tablet Commonly known as: Zithromax Z-Pak Take 1 tablet (250 mg) once daily for 3 more days, starting  8/16 Start taking on: August 27, 2021   buPROPion 300 MG 24 hr tablet Commonly known as: WELLBUTRIN XL Take 1 tablet (300 mg total) by mouth daily.   Combivent Respimat 20-100 MCG/ACT Aers respimat Generic drug: Ipratropium-Albuterol Inhale 1 puff into the lungs 4 (four) times daily.   escitalopram 20 MG tablet Commonly known as: LEXAPRO Take 20 mg by mouth daily.   estradiol 2 MG tablet Commonly known as: ESTRACE Take 2 mg by mouth in the morning and at bedtime.   famotidine 40 MG tablet Commonly known as: PEPCID Take 40 mg by mouth daily. What changed: Another medication with the same name was removed. Continue taking this medication, and follow the directions you see here.   gabapentin 600 MG tablet Commonly known as: NEURONTIN Take 600 mg by mouth 3 (three) times daily. What changed: Another medication with the same name was removed. Continue taking this medication, and follow the directions you see here.   guaiFENesin 600 MG 12 hr tablet Commonly known as: MUCINEX Take 1 tablet (600 mg total) by mouth 2 (two) times daily for 7 days.   hydrochlorothiazide 12.5 MG capsule  Commonly known as: MICROZIDE Take 12.5 mg by mouth daily.   hydrocortisone 2.5 % lotion APPLY TO AFFECTEDAREA RASH ON BUTTOCKS 3 DAYS PER WEEK. TUESDAY, THURSDAY, SATURDAY, AND AS NEEDED FOR FLARES   loratadine 10 MG tablet Commonly known as: CLARITIN Take 10 mg by mouth daily.   meloxicam 15 MG tablet Commonly known as: MOBIC Take 1 tablet (15 mg total) by mouth daily.   metFORMIN 1000 MG tablet Commonly known as: GLUCOPHAGE Take 1,000 mg by mouth 2 (two) times daily with a meal.   oxybutynin 5 MG tablet Commonly known as: DITROPAN Take 5 mg by mouth every 12 (twelve) hours.   pantoprazole 40 MG tablet Commonly known as: Protonix Take 1 tablet (40 mg total) by mouth daily.   predniSONE 20 MG tablet Commonly known as: DELTASONE Take 2 tablets (40 mg total) by mouth daily with  breakfast for 3 days. Start taking on: August 27, 2021   simethicone 125 MG chewable tablet Commonly known as: MYLICON Chew 921 mg by mouth every 6 (six) hours as needed for flatulence.   spironolactone 50 MG tablet Commonly known as: ALDACTONE Take 50 mg by mouth 2 (two) times daily.   tiZANidine 4 MG tablet Commonly known as: Zanaflex Take 1 tablet (4 mg total) by mouth every 8 (eight) hours as needed for muscle spasms.   Trelegy Ellipta 100-62.5-25 MCG/ACT Aepb Generic drug: Fluticasone-Umeclidin-Vilant Inhale 1 puff into the lungs daily.        Discharge Exam: Filed Weights   08/25/21 1138  Weight: 133.4 kg   General exam: awake, alert, no acute distress, obese HEENT: atraumatic, clear conjunctiva, anicteric sclera, moist mucus membranes, hearing grossly normal  Respiratory system: CTAB, no wheezes, rales or rhonchi, normal respiratory effort. Cardiovascular system: normal S1/S2, RRR, no JVD, murmurs, rubs, gallops, no pedal edema.   Gastrointestinal system: soft, NT, ND, no HSM felt, +bowel sounds. Central nervous system: A&O x4. no gross focal neurologic deficits, normal speech Extremities: moves all, no edema, normal tone Skin: dry, intact, normal temperature, normal color,No rashes, lesions or ulcers Psychiatry: normal mood, congruent affect, judgement and insight appear normal   Condition at discharge: stable  The results of significant diagnostics from this hospitalization (including imaging, microbiology, ancillary and laboratory) are listed below for reference.   Imaging Studies: CT Angio Chest PE W and/or Wo Contrast  Result Date: 08/25/2021 CLINICAL DATA:  Pulmonary embolus EXAM: CT ANGIOGRAPHY CHEST WITH CONTRAST TECHNIQUE: Multidetector CT imaging of the chest was performed using the standard protocol during bolus administration of intravenous contrast. Multiplanar CT image reconstructions and MIPs were obtained to evaluate the vascular anatomy. RADIATION  DOSE REDUCTION: This exam was performed according to the departmental dose-optimization program which includes automated exposure control, adjustment of the mA and/or kV according to patient size and/or use of iterative reconstruction technique. CONTRAST:  67m OMNIPAQUE IOHEXOL 350 MG/ML SOLN COMPARISON:  Chest radiograph 08/25/2021 FINDINGS: Cardiovascular: No convincing filling defect is identified in the pulmonary arterial tree to indicate pulmonary embolus. There is some hypodense artifact in the left pulmonary artery on image 196 of series 6. Atherosclerotic calcification of the thoracic aorta noted. Interventricular septal wall thickening up to 2.1 cm, raising the possibility of left ventricular hypertrophy. Mediastinum/Nodes: No pathologic adenopathy observed. Lungs/Pleura: Paraseptal emphysema. Moderate bilateral airway thickening. Upper Abdomen: Multiple gallstones measuring up to 2.1 cm in long axis. No common hepatic duct or common bile duct dilatation. Abdominal aortic atherosclerosis. Musculoskeletal: Bilateral gynecomastia. Lower cervical and upper thoracic anterior interbody spurring.  Review of the MIP images confirms the above findings. IMPRESSION: 1. No convincing filling defect is identified in the pulmonary arterial tree to suggest pulmonary embolus. 2. Airway thickening is present, suggesting bronchitis or reactive airways disease. 3. Aortic Atherosclerosis (ICD10-I70.0) and Emphysema (ICD10-J43.9). 4. Suspected left ventricular hypertrophy. 5. Cholelithiasis. 6. Bilateral gynecomastia. Electronically Signed   By: Van Clines M.D.   On: 08/25/2021 19:40   DG Chest 2 View  Result Date: 08/25/2021 CLINICAL DATA:  Chest pain. EXAM: CHEST - 2 VIEW COMPARISON:  09/29/2020 FINDINGS: The heart size and mediastinal contours are within normal limits. Both lungs are clear. No visible pleural effusions or pneumothorax. No acute osseous abnormality. IMPRESSION: No active cardiopulmonary disease.  Electronically Signed   By: Margaretha Sheffield M.D.   On: 08/25/2021 13:42    Microbiology: Results for orders placed or performed during the hospital encounter of 08/25/21  Resp Panel by RT-PCR (Flu A&B, Covid) Anterior Nasal Swab     Status: None   Collection Time: 08/25/21  3:44 PM   Specimen: Anterior Nasal Swab  Result Value Ref Range Status   SARS Coronavirus 2 by RT PCR NEGATIVE NEGATIVE Final    Comment: (NOTE) SARS-CoV-2 target nucleic acids are NOT DETECTED.  The SARS-CoV-2 RNA is generally detectable in upper respiratory specimens during the acute phase of infection. The lowest concentration of SARS-CoV-2 viral copies this assay can detect is 138 copies/mL. A negative result does not preclude SARS-Cov-2 infection and should not be used as the sole basis for treatment or other patient management decisions. A negative result may occur with  improper specimen collection/handling, submission of specimen other than nasopharyngeal swab, presence of viral mutation(s) within the areas targeted by this assay, and inadequate number of viral copies(<138 copies/mL). A negative result must be combined with clinical observations, patient history, and epidemiological information. The expected result is Negative.  Fact Sheet for Patients:  EntrepreneurPulse.com.au  Fact Sheet for Healthcare Providers:  IncredibleEmployment.be  This test is no t yet approved or cleared by the [place] FDA and  has been authorized for detection and/or diagnosis of SARS-CoV-2 by FDA under an Emergency Use Authorization (EUA). This EUA will remain  in effect (meaning this test can be used) for the duration of the COVID-19 declaration under Section 564(b)(1) of the Act, 21 U.S.C.section 360bbb-3(b)(1), unless the authorization is terminated  or revoked sooner.       Influenza A by PCR NEGATIVE NEGATIVE Final   Influenza B by PCR NEGATIVE NEGATIVE Final     Comment: (NOTE) The Xpert Xpress SARS-CoV-2/FLU/RSV plus assay is intended as an aid in the diagnosis of influenza from Nasopharyngeal swab specimens and should not be used as a sole basis for treatment. Nasal washings and aspirates are unacceptable for Xpert Xpress SARS-CoV-2/FLU/RSV testing.  Fact Sheet for Patients: EntrepreneurPulse.com.au  Fact Sheet for Healthcare Providers: IncredibleEmployment.be  This test is not yet approved or cleared by the [place] FDA and has been authorized for detection and/or diagnosis of SARS-CoV-2 by FDA under an Emergency Use Authorization (EUA). This EUA will remain in effect (meaning this test can be used) for the duration of the COVID-19 declaration under Section 564(b)(1) of the Act, 21 U.S.C. section 360bbb-3(b)(1), unless the authorization is terminated or revoked.  Performed at [facility], [place], [place] [address]   Respiratory (~20 pathogens) panel by PCR     Status: None   Collection Time: 08/25/21  5:05 PM   Specimen: Nasopharyngeal Swab; Respiratory  Result Value Ref Range Status   Adenovirus NOT DETECTED NOT DETECTED Final   Coronavirus 229E NOT DETECTED NOT DETECTED Final    Comment: (NOTE) The Coronavirus on the Respiratory Panel, DOES NOT test for the novel  Coronavirus (2019 nCoV)    Coronavirus HKU1 NOT DETECTED NOT DETECTED Final   Coronavirus NL63 NOT DETECTED NOT DETECTED Final   Coronavirus OC43 NOT DETECTED NOT DETECTED Final   Metapneumovirus NOT DETECTED NOT DETECTED Final   Rhinovirus / Enterovirus NOT DETECTED NOT DETECTED Final   Influenza A NOT DETECTED NOT DETECTED Final   Influenza B NOT DETECTED NOT DETECTED Final   Parainfluenza Virus 1 NOT DETECTED NOT DETECTED Final   Parainfluenza Virus 2 NOT DETECTED NOT DETECTED Final   Parainfluenza Virus 3 NOT DETECTED NOT DETECTED Final   Parainfluenza Virus 4 NOT DETECTED NOT DETECTED Final    Respiratory Syncytial Virus NOT DETECTED NOT DETECTED Final   Bordetella pertussis NOT DETECTED NOT DETECTED Final   Bordetella Parapertussis NOT DETECTED NOT DETECTED Final   Chlamydophila pneumoniae NOT DETECTED NOT DETECTED Final   Mycoplasma pneumoniae NOT DETECTED NOT DETECTED Final    Comment: Performed at [facility], [place] [address], [place], [place] [address]    Labs: CBC: Recent Labs  Lab 08/25/21 1141 08/26/21 0358  WBC 10.1 6.8  HGB 14.5 13.4  HCT 44.7 41.9  MCV 82.5 83.3  PLT 325 357   Basic Metabolic Panel: Recent Labs  Lab 08/25/21 1141 08/26/21 0358  NA 135 135  K 4.6 4.6  CL 103 103  CO2 23 23  GLUCOSE 117* 172*  BUN 18 18  CREATININE 1.23 0.99  CALCIUM 9.7 9.1   Liver Function Tests: No results for input(s): "AST", "ALT", "ALKPHOS", "BILITOT", "PROT", "ALBUMIN" in the last 168 hours. CBG: Recent Labs  Lab 08/25/21 2034 08/26/21 0737 08/26/21 1119  GLUCAP 172* 156* 190*    Discharge time spent: less than 30 minutes.  Signed: Ezekiel Slocumb, DO [facility] 08/26/2021

## 2021-08-26 NOTE — Care Management CC44 (Signed)
Condition Code 44 Documentation Completed  Patient Details  Name: [name]. MRN: [id number] Date of Birth: 26-Feb-1965   Condition Code 44 given:  Yes Patient signature on Condition Code 44 notice:  Yes Documentation of 2 MD's agreement:  Yes Code 44 added to claim:  Yes    Laurena Slimmer, RN 08/26/2021, 4:00 PM

## 2021-09-11 NOTE — Progress Notes (Signed)
Patient with COPD exacerbation and respiratory symptoms and viral symptoms, causing hypoxia and requiring admission.

## 2021-11-09 ENCOUNTER — Emergency Department: Payer: Medicare Other

## 2021-11-09 ENCOUNTER — Other Ambulatory Visit: Payer: Self-pay

## 2021-11-09 ENCOUNTER — Encounter: Payer: Self-pay | Admitting: Emergency Medicine

## 2021-11-09 ENCOUNTER — Inpatient Hospital Stay
Admission: EM | Admit: 2021-11-09 | Discharge: 2021-11-14 | DRG: 321 | Disposition: A | Discharge status: Home / Self Care | Payer: Medicare Other | Attending: Internal Medicine | Admitting: Internal Medicine

## 2021-11-09 DIAGNOSIS — I25110 Atherosclerotic heart disease of native coronary artery with unstable angina pectoris: Secondary | ICD-10-CM | POA: Diagnosis present

## 2021-11-09 DIAGNOSIS — Z811 Family history of alcohol abuse and dependence: Secondary | ICD-10-CM

## 2021-11-09 DIAGNOSIS — N319 Neuromuscular dysfunction of bladder, unspecified: Secondary | ICD-10-CM | POA: Diagnosis present

## 2021-11-09 DIAGNOSIS — Z91013 Allergy to seafood: Secondary | ICD-10-CM

## 2021-11-09 DIAGNOSIS — E119 Type 2 diabetes mellitus without complications: Secondary | ICD-10-CM

## 2021-11-09 DIAGNOSIS — J9621 Acute and chronic respiratory failure with hypoxia: Secondary | ICD-10-CM | POA: Diagnosis present

## 2021-11-09 DIAGNOSIS — Z7951 Long term (current) use of inhaled steroids: Secondary | ICD-10-CM

## 2021-11-09 DIAGNOSIS — E785 Hyperlipidemia, unspecified: Secondary | ICD-10-CM | POA: Diagnosis present

## 2021-11-09 DIAGNOSIS — Z800 Family history of malignant neoplasm of digestive organs: Secondary | ICD-10-CM

## 2021-11-09 DIAGNOSIS — Z885 Allergy status to narcotic agent status: Secondary | ICD-10-CM

## 2021-11-09 DIAGNOSIS — Z79899 Other long term (current) drug therapy: Secondary | ICD-10-CM

## 2021-11-09 DIAGNOSIS — I2089 Other forms of angina pectoris: Secondary | ICD-10-CM | POA: Diagnosis not present

## 2021-11-09 DIAGNOSIS — R0602 Shortness of breath: Secondary | ICD-10-CM | POA: Diagnosis not present

## 2021-11-09 DIAGNOSIS — F32A Depression, unspecified: Secondary | ICD-10-CM | POA: Diagnosis present

## 2021-11-09 DIAGNOSIS — I10 Essential (primary) hypertension: Secondary | ICD-10-CM | POA: Diagnosis present

## 2021-11-09 DIAGNOSIS — Z8249 Family history of ischemic heart disease and other diseases of the circulatory system: Secondary | ICD-10-CM

## 2021-11-09 DIAGNOSIS — Z20822 Contact with and (suspected) exposure to covid-19: Secondary | ICD-10-CM | POA: Diagnosis present

## 2021-11-09 DIAGNOSIS — J9601 Acute respiratory failure with hypoxia: Secondary | ICD-10-CM

## 2021-11-09 DIAGNOSIS — Z833 Family history of diabetes mellitus: Secondary | ICD-10-CM

## 2021-11-09 DIAGNOSIS — Z6838 Body mass index (BMI) 38.0-38.9, adult: Secondary | ICD-10-CM

## 2021-11-09 DIAGNOSIS — Z5900 Homelessness unspecified: Secondary | ICD-10-CM

## 2021-11-09 DIAGNOSIS — I214 Non-ST elevation (NSTEMI) myocardial infarction: Secondary | ICD-10-CM | POA: Diagnosis not present

## 2021-11-09 DIAGNOSIS — Z7984 Long term (current) use of oral hypoglycemic drugs: Secondary | ICD-10-CM

## 2021-11-09 DIAGNOSIS — F17210 Nicotine dependence, cigarettes, uncomplicated: Secondary | ICD-10-CM | POA: Diagnosis present

## 2021-11-09 DIAGNOSIS — E1142 Type 2 diabetes mellitus with diabetic polyneuropathy: Secondary | ICD-10-CM | POA: Diagnosis present

## 2021-11-09 DIAGNOSIS — J441 Chronic obstructive pulmonary disease with (acute) exacerbation: Secondary | ICD-10-CM | POA: Diagnosis not present

## 2021-11-09 DIAGNOSIS — F329 Major depressive disorder, single episode, unspecified: Secondary | ICD-10-CM | POA: Diagnosis present

## 2021-11-09 DIAGNOSIS — F640 Transsexualism: Secondary | ICD-10-CM | POA: Diagnosis present

## 2021-11-09 LAB — CBC
HCT: 39.1 % (ref 39.0–52.0)
Hemoglobin: 12.8 g/dL — ABNORMAL LOW (ref 13.0–17.0)
MCH: 26.3 pg (ref 26.0–34.0)
MCHC: 32.7 g/dL (ref 30.0–36.0)
MCV: 80.5 fL (ref 80.0–100.0)
Platelets: 309 10*3/uL (ref 150–400)
RBC: 4.86 MIL/uL (ref 4.22–5.81)
RDW: 15 % (ref 11.5–15.5)
WBC: 7.9 10*3/uL (ref 4.0–10.5)
nRBC: 0 % (ref 0.0–0.2)

## 2021-11-09 LAB — HEPATIC FUNCTION PANEL
ALT: 18 U/L (ref 0–44)
AST: 20 U/L (ref 15–41)
Albumin: 3.5 g/dL (ref 3.5–5.0)
Alkaline Phosphatase: 69 U/L (ref 38–126)
Bilirubin, Direct: <0.1 mg/dL (ref 0.0–0.2)
Total Bilirubin: 0.8 mg/dL (ref 0.3–1.2)
Total Protein: 8 g/dL (ref 6.5–8.1)

## 2021-11-09 LAB — BLOOD GAS, VENOUS
Acid-Base Excess: 0.3 mmol/L (ref 0.0–2.0)
Bicarbonate: 25.4 mmol/L (ref 20.0–28.0)
O2 Saturation: 81.9 %
Patient temperature: 37
pCO2, Ven: 42 mmHg — ABNORMAL LOW (ref 44–60)
pH, Ven: 7.39 (ref 7.25–7.43)
pO2, Ven: 52 mmHg — ABNORMAL HIGH (ref 32–45)

## 2021-11-09 LAB — BRAIN NATRIURETIC PEPTIDE: B Natriuretic Peptide: 44.9 pg/mL (ref 0.0–100.0)

## 2021-11-09 LAB — LIPASE, BLOOD: Lipase: 30 U/L (ref 11–51)

## 2021-11-09 LAB — BASIC METABOLIC PANEL
Anion gap: 12 (ref 5–15)
BUN: 17 mg/dL (ref 6–20)
CO2: 25 mmol/L (ref 22–32)
Calcium: 9.1 mg/dL (ref 8.9–10.3)
Chloride: 97 mmol/L — ABNORMAL LOW (ref 98–111)
Creatinine, Ser: 1.28 mg/dL — ABNORMAL HIGH (ref 0.61–1.24)
GFR, Estimated: >60 mL/min (ref >60)
Glucose, Bld: 126 mg/dL — ABNORMAL HIGH (ref 70–99)
Potassium: 3.9 mmol/L (ref 3.5–5.1)
Sodium: 134 mmol/L — ABNORMAL LOW (ref 135–145)

## 2021-11-09 LAB — RESP PANEL BY RT-PCR (FLU A&B, COVID) ARPGX2
Influenza A by PCR: NEGATIVE
Influenza B by PCR: NEGATIVE
SARS Coronavirus 2 by RT PCR: NEGATIVE

## 2021-11-09 LAB — TROPONIN I (HIGH SENSITIVITY)
Troponin I (High Sensitivity): 12 ng/L (ref <18)
Troponin I (High Sensitivity): 17 ng/L (ref <18)

## 2021-11-09 LAB — PROCALCITONIN: Procalcitonin: <0.1 ng/mL

## 2021-11-09 MED ORDER — ARIPIPRAZOLE 5 MG PO TABS
5.0000 mg | ORAL_TABLET | Freq: Every day | ORAL | Status: DC
  Administered 2021-11-10 – 2021-11-13 (×4): 5 mg via ORAL
  Filled 2021-11-09 (×5): qty 1

## 2021-11-09 MED ORDER — GABAPENTIN 600 MG PO TABS
600.0000 mg | ORAL_TABLET | Freq: Three times a day (TID) | ORAL | Status: DC
  Administered 2021-11-09 – 2021-11-13 (×13): 600 mg via ORAL
  Filled 2021-11-09 (×14): qty 1

## 2021-11-09 MED ORDER — IPRATROPIUM-ALBUTEROL 0.5-2.5 (3) MG/3ML IN SOLN
3.0000 mL | Freq: Once | RESPIRATORY_TRACT | Status: AC
  Administered 2021-11-09: 3 mL via RESPIRATORY_TRACT
  Filled 2021-11-09: qty 3

## 2021-11-09 MED ORDER — AZITHROMYCIN 500 MG PO TABS
500.0000 mg | ORAL_TABLET | Freq: Every day | ORAL | Status: DC
  Administered 2021-11-09: 500 mg via ORAL
  Filled 2021-11-09: qty 1

## 2021-11-09 MED ORDER — ENOXAPARIN SODIUM 80 MG/0.8ML IJ SOSY
0.5000 mg/kg | PREFILLED_SYRINGE | INTRAMUSCULAR | Status: DC
  Administered 2021-11-09: 65 mg via SUBCUTANEOUS
  Filled 2021-11-09: qty 0.65

## 2021-11-09 MED ORDER — SODIUM CHLORIDE 0.9% FLUSH
3.0000 mL | Freq: Two times a day (BID) | INTRAVENOUS | Status: DC
  Administered 2021-11-11: 3 mL via INTRAVENOUS

## 2021-11-09 MED ORDER — AZITHROMYCIN 250 MG PO TABS
500.0000 mg | ORAL_TABLET | Freq: Every day | ORAL | Status: AC
  Administered 2021-11-10 – 2021-11-13 (×4): 500 mg via ORAL
  Filled 2021-11-09 (×3): qty 2
  Filled 2021-11-09: qty 1

## 2021-11-09 MED ORDER — IOHEXOL 350 MG/ML SOLN
75.0000 mL | Freq: Once | INTRAVENOUS | Status: AC | PRN
  Administered 2021-11-09: 75 mL via INTRAVENOUS

## 2021-11-09 MED ORDER — BUPROPION HCL ER (XL) 150 MG PO TB24: 300.0000 mg | ORAL_TABLET | Freq: Every day | ORAL | Status: DC

## 2021-11-09 MED ORDER — ONDANSETRON HCL 4 MG/2ML IJ SOLN: 4.0000 mg | Freq: Four times a day (QID) | INTRAMUSCULAR | Status: DC | PRN

## 2021-11-09 MED ORDER — IPRATROPIUM-ALBUTEROL 0.5-2.5 (3) MG/3ML IN SOLN
3.0000 mL | Freq: Four times a day (QID) | RESPIRATORY_TRACT | Status: DC
  Administered 2021-11-09 – 2021-11-11 (×4): 3 mL via RESPIRATORY_TRACT
  Filled 2021-11-09 (×4): qty 3

## 2021-11-09 MED ORDER — SPIRONOLACTONE 25 MG PO TABS
75.0000 mg | ORAL_TABLET | Freq: Two times a day (BID) | ORAL | Status: DC
  Administered 2021-11-09 – 2021-11-13 (×9): 75 mg via ORAL
  Filled 2021-11-09 (×9): qty 3

## 2021-11-09 MED ORDER — ESCITALOPRAM OXALATE 10 MG PO TABS
20.0000 mg | ORAL_TABLET | Freq: Every day | ORAL | Status: DC
  Administered 2021-11-10 – 2021-11-13 (×4): 20 mg via ORAL
  Filled 2021-11-09 (×5): qty 2

## 2021-11-09 MED ORDER — ESTRADIOL 0.5 MG PO TABS
2.0000 mg | ORAL_TABLET | Freq: Two times a day (BID) | ORAL | Status: DC
  Administered 2021-11-10 – 2021-11-13 (×8): 2 mg via ORAL
  Filled 2021-11-09 (×9): qty 4

## 2021-11-09 MED ORDER — FAMOTIDINE 20 MG PO TABS
40.0000 mg | ORAL_TABLET | Freq: Every day | ORAL | Status: DC
  Administered 2021-11-10 – 2021-11-13 (×4): 40 mg via ORAL
  Filled 2021-11-09 (×4): qty 2

## 2021-11-09 MED ORDER — ADULT MULTIVITAMIN W/MINERALS CH
1.0000 | ORAL_TABLET | Freq: Every day | ORAL | Status: DC
  Administered 2021-11-09 – 2021-11-13 (×5): 1 via ORAL
  Filled 2021-11-09 (×5): qty 1

## 2021-11-09 MED ORDER — METHYLPREDNISOLONE SODIUM SUCC 125 MG IJ SOLR
125.0000 mg | Freq: Once | INTRAMUSCULAR | Status: AC
  Administered 2021-11-09: 125 mg via INTRAVENOUS
  Filled 2021-11-09: qty 2

## 2021-11-09 MED ORDER — ATORVASTATIN CALCIUM 20 MG PO TABS
40.0000 mg | ORAL_TABLET | Freq: Every evening | ORAL | Status: DC
  Administered 2021-11-10 – 2021-11-12 (×3): 40 mg via ORAL
  Filled 2021-11-09 (×3): qty 2

## 2021-11-09 MED ORDER — SODIUM CHLORIDE 0.9% FLUSH
3.0000 mL | Freq: Two times a day (BID) | INTRAVENOUS | Status: DC
  Administered 2021-11-11 – 2021-11-13 (×4): 3 mL via INTRAVENOUS

## 2021-11-09 MED ORDER — OXYBUTYNIN CHLORIDE 5 MG PO TABS
5.0000 mg | ORAL_TABLET | Freq: Two times a day (BID) | ORAL | Status: DC
  Administered 2021-11-10 – 2021-11-13 (×8): 5 mg via ORAL
  Filled 2021-11-09 (×9): qty 1

## 2021-11-09 MED ORDER — ONDANSETRON HCL 4 MG PO TABS: 4.0000 mg | ORAL_TABLET | Freq: Four times a day (QID) | ORAL | Status: DC | PRN

## 2021-11-09 MED ORDER — ACETAMINOPHEN 325 MG PO TABS
650.0000 mg | ORAL_TABLET | Freq: Four times a day (QID) | ORAL | Status: DC | PRN
  Administered 2021-11-09 – 2021-11-13 (×5): 650 mg via ORAL
  Filled 2021-11-09 (×5): qty 2

## 2021-11-09 MED ORDER — SODIUM CHLORIDE 0.9 % IV SOLN
2.0000 g | INTRAVENOUS | Status: DC
  Administered 2021-11-11 – 2021-11-12 (×2): 2 g via INTRAVENOUS
  Filled 2021-11-09 (×2): qty 20
  Filled 2021-11-09: qty 2

## 2021-11-09 MED ORDER — HYDROCHLOROTHIAZIDE 12.5 MG PO TABS
12.5000 mg | ORAL_TABLET | Freq: Every day | ORAL | Status: DC
  Administered 2021-11-10 – 2021-11-11 (×2): 12.5 mg via ORAL
  Filled 2021-11-09 (×2): qty 1

## 2021-11-09 MED ORDER — SODIUM CHLORIDE 0.9 % IV SOLN
2.0000 g | Freq: Once | INTRAVENOUS | Status: AC
  Administered 2021-11-09: 2 g via INTRAVENOUS
  Filled 2021-11-09: qty 20

## 2021-11-09 MED ORDER — NICOTINE 7 MG/24HR TD PT24
7.0000 mg | MEDICATED_PATCH | Freq: Every day | TRANSDERMAL | Status: DC
  Administered 2021-11-11 – 2021-11-13 (×2): 7 mg via TRANSDERMAL
  Filled 2021-11-09 (×6): qty 1

## 2021-11-09 MED ORDER — POLYETHYLENE GLYCOL 3350 17 G PO PACK: 17.0000 g | PACK | Freq: Every day | ORAL | Status: DC | PRN

## 2021-11-09 MED ORDER — INSULIN ASPART 100 UNIT/ML IJ SOLN
0.0000 [IU] | Freq: Three times a day (TID) | INTRAMUSCULAR | Status: DC
  Administered 2021-11-10 – 2021-11-13 (×4): 2 [IU] via SUBCUTANEOUS
  Filled 2021-11-09 (×6): qty 1

## 2021-11-09 MED ORDER — ACETAMINOPHEN 650 MG RE SUPP: 650.0000 mg | Freq: Four times a day (QID) | RECTAL | Status: DC | PRN

## 2021-11-09 MED ORDER — PREDNISONE 20 MG PO TABS
40.0000 mg | ORAL_TABLET | Freq: Every day | ORAL | Status: AC
  Administered 2021-11-10 – 2021-11-14 (×5): 40 mg via ORAL
  Filled 2021-11-09 (×5): qty 2

## 2021-11-09 NOTE — Assessment & Plan Note (Signed)
-   Restart home Lexapro, Wellbutrin and Abilify

## 2021-11-09 NOTE — Assessment & Plan Note (Signed)
-   Restart home antihypertensives

## 2021-11-09 NOTE — ED Triage Notes (Addendum)
Pt with worsening of her shob this week.  States she "passed out" this morning.  Does not wear home O2.  Pt endorses n/v/d and chest pain as well.  CP does not radiate.  Pt 83% on RA.  Placed on 2 liters with no improvement. Turned up to 4L [place] and sats remain at 89%

## 2021-11-09 NOTE — Assessment & Plan Note (Signed)
Patient presenting with 1 week of increased dyspnea, cough and sputum production requiring increased use of home inhalers.  Initially able to tolerate supplemental oxygen via nasal cannula only, however due to increased work of breathing when oxygen was discontinued, patient has been placed on BiPAP and tolerating well.  CT imaging notable for tree-in-bud nodularity concerning for infectious bronchiolitis.  Given history of COPD and active smoking, will obtain sputum cultures.  -Continue supplemental oxygen as needed to maintain oxygen saturation above 88% - Continue BiPAP - Wean both as tolerated - DuoNebs every 6 hours - S/p Solu-Medrol 125 mg.  Start prednisone 40 mg daily tomorrow to complete a 5-day course - Given severity of exacerbation, will continue CAP coverage with ceftriaxone and azithromycin.  Can likely discontinue once clinically improved - Sputum cultures pending

## 2021-11-09 NOTE — ED Notes (Signed)
Pt requests Bi-pap back on for increased work of breathing.

## 2021-11-09 NOTE — ED Notes (Signed)
Respiratory removes Bi-pap for trial

## 2021-11-09 NOTE — Progress Notes (Signed)
Pt checked on after placed back on by RN, at this time patient has NRB mask on, stated that he is tolerating NRB at this time and will stay off BIPAP for now.

## 2021-11-09 NOTE — ED Notes (Signed)
Pt returned from X-ray, Duo neb given. Pt instructed to push call bell when Neb finished, which they did not. Pt off O2 for approximately 3 minutes, became dyspnic and diaphoretic. O2 restarted at 4 lpm via [place], then increased to 6 lpm. Dr. Jari Pigg alerted and came to bed side. Lungs clear. Respiratory to be contacted for Bi-pap.

## 2021-11-09 NOTE — ED Provider Notes (Signed)
[facility] Provider Note    Event Date/Time   First MD Initiated Contact with Patient 11/09/21 1218     (approximate)   History   Shortness of Breath   HPI  [name]. is a 56 y.o. adult male to male transgender, COPD who comes in with concerns for COPD flare.  Patient reports feeling more short of breath for the past week however acutely more short of breath today.  Denies any history of blood clots.  Does have a little bit of baseline swelling greater on the left than the right that he reports has been like that for baseline.  Has had prior ultrasounds have been negative for DVT and denies any new leg swelling or leg pain.  He reports being on Lasix and having weight loss.  Patient reports not being on any oxygen at home    Physical Exam   Triage Vital Signs: ED Triage Vitals [11/09/21 1208]  Enc Vitals Group     BP 126/69     Pulse Rate (!) 120     Resp (!) 24     Temp 98.7 F (37.1 C)     Temp Source Oral     SpO2 (!) 83 %     Weight      Height      Head Circumference      Peak Flow      Pain Score 6     Pain Loc      Pain Edu?      Excl. in [place]?     Most recent vital signs: Vitals:   11/09/21 1208  BP: 126/69  Pulse: (!) 120  Resp: (!) 24  Temp: 98.7 F (37.1 C)  SpO2: (!) 83%     General: Awake, no distress.  CV:  Good peripheral perfusion.  Tachycardic Resp:  Normal effort.  Abd:  No distention.  Soft nontender Other:  Mild increased work of breathing with a little bit of wheezing noted Maybe some slight left greater than right but overall pretty reassuring and he reports that this is baseline for him.  Chronic venous stasis changes  ED Results / Procedures / Treatments   Labs (all labs ordered are listed, but only abnormal results are displayed) Labs Reviewed  RESP PANEL BY RT-PCR (FLU A&B, COVID) [id number]  BASIC METABOLIC PANEL  CBC  BRAIN NATRIURETIC PEPTIDE  HEPATIC FUNCTION PANEL  LIPASE, BLOOD   TROPONIN I (HIGH SENSITIVITY)     EKG  My interpretation of EKG:  Sinus tachycardia rate of 121 without any ST elevation or T wave inversions, normal intervals  RADIOLOGY I have reviewed the xray personally and interpreted and no pneumonia   PROCEDURES:  Critical Care performed: Yes, see critical care procedure note(s)  .Critical Care  Performed by: Vanessa [place], MD Authorized by: Vanessa [place], MD   Critical care provider statement:    Critical care time (minutes):  30   Critical care was necessary to treat or prevent imminent or life-threatening deterioration of the following conditions:  Respiratory failure   Critical care was time spent personally by me on the following activities:  Development of treatment plan with patient or surrogate, discussions with consultants, evaluation of patient's response to treatment, examination of patient, ordering and review of laboratory studies, ordering and review of radiographic studies, ordering and performing treatments and interventions, pulse oximetry, re-evaluation of patient's condition and review of old charts .1-3 Lead EKG Interpretation  Performed by:  Vanessa [place], MD Authorized by: Vanessa Blair, MD     Interpretation: abnormal     ECG rate:  120   ECG rate assessment: tachycardic     Rhythm: sinus tachycardia     Ectopy: none     Conduction: normal      MEDICATIONS ORDERED IN ED: Medications  ipratropium-albuterol (DUONEB) 0.5-2.5 (3) MG/3ML nebulizer solution 3 mL (3 mLs Nebulization Given 11/09/21 1235)  methylPREDNISolone sodium succinate (SOLU-MEDROL) 125 mg/2 mL injection 125 mg (125 mg Intravenous Given 11/09/21 1235)     IMPRESSION / MDM / ASSESSMENT AND PLAN / ED COURSE  I reviewed the triage vital signs and the nursing notes.   Patient's presentation is most consistent with acute presentation with potential threat to life or bodily function.   Patient was hypoxic to 83% and heart rate was 120 patient  was placed on 4 L of oxygen.  I suspect this could be COPD but I do not hear a significant amount of wheezing therefore I discussed getting a CT scan to make sure no pulmonary embolism, labs to evaluate for ACS, COVID testing.  We will trial DuoNeb, steroids.  CBC is normal slightly low hemoglobin but is had that previously  Patient had briefly come off the oxygen when given the DuoNeb and then afterward had significant increased work of breathing and tachycardia.  Patient has some labored breathing will get VBG and placed on BiPAP.  VBG is okay with PCO2 42  Lipase nromal  Bmp elevated CR 1.28   CT imaging does not show evidence of PE but does show some tree-in-bud nodularities.  Discussed with patient needing a 70-monthfollow-up.  Patient does have a little bit of gallstones noted but had no right upper quadrant tenderness.  His hepatic function test and lipase are reassuring.  We will discuss possible team for admission for COPD encounter with some antibiotics we will add on procalcitonin but seems less likely infectious in nature  The patient is on the cardiac monitor to evaluate for evidence of arrhythmia and/or significant heart rate changes.      FINAL CLINICAL IMPRESSION(S) / ED DIAGNOSES   Final diagnoses:  [place]  Acute respiratory failure with hypoxia ([place]     Rx / DC Orders   ED Discharge Orders     None        Note:  This document was prepared using Dragon voice recognition software and may include unintentional dictation errors.   FVanessa Plymouth MD 11/09/21 1455

## 2021-11-09 NOTE — ED Notes (Signed)
Pt feeling like they couldn't breath with the Bi-pap, requesting a NRB.  NRB placed at 15 lpm.

## 2021-11-09 NOTE — Assessment & Plan Note (Signed)
[name] endorses at least 1 month history of chest pain with exertion that does not occur at rest.  Per chart review, normal stress test in 2021.  Given this is a new onset, will obtain an echocardiogram, however I have counseled [name] that she will need to establish with cardiology.  - Echocardiogram pending

## 2021-11-09 NOTE — Assessment & Plan Note (Signed)
-   SSI, moderate 

## 2021-11-09 NOTE — H&P (Signed)
History and Physical    Patient: [name]. UEA:[id number] DOB: 15-Apr-1965 DOA: 11/09/2021 DOS: the patient was seen and examined on 11/09/2021 PCP: Howard Pouch, NP  Patient coming from: Homeless  Chief Complaint:  Chief Complaint  Patient presents with   Shortness of Breath   HPI: [name]. is a 56 y.o. adult with medical history significant of COPD, type 2 diabetes, CAD, hypertension, hyperlipidemia who presents to the ED with complaints of shortness of breath.  [name] states she began to develop progressively worsening shortness of breath with productive cough approximately 1 week ago.  She also endorses nasal congestion but denies any fever, chills.  She has been requiring increased use of her inhalers without significant improvement. [name] also endorses chest pain for the last week that has been occurring with cough.  She also does experience chest pain when exerting herself though and this has been ongoing for 1 month.  She denies any chest pain at rest.  Patient otherwise denies any nausea, vomiting, diarrhea, abdominal pain.  She notes chronic lower extremity swelling that is unchanged in nature.  ED course: On arrival to the ED, patient was normotensive at 126/69 with heart rate of 120.  She was saturating 83 percent on room air and was subsequently placed on 4 L.  VBG with PO2 of 52, PCO2 of 49, pH 7.39.  CMP remarkable for creatinine of 1.28.  CT angio obtained that demonstrated no evidence of PE, however interval development of diffuse tree-in-bud nodularity involving all lobes of both lungs.  Due to acute hypoxic respiratory failure, [facility] contacted for admission.  Review of Systems: As mentioned in the history of present illness. All other systems reviewed and are negative. Past Medical History:  Diagnosis Date   Carpal tunnel syndrome    bilateral   COPD (chronic obstructive pulmonary disease) (HCC)    Coronary artery disease    Diabetes  mellitus without complication ([place])    Endotracheally intubated 09/30/2020   Hyperlipidemia    Hypertension    Neuropathy    On mechanically assisted ventilation ([place]) 09/30/2020   Peptic ulcer    Sleep apnea    Past Surgical History:  Procedure Laterality Date   COLONOSCOPY WITH PROPOFOL N/A 11/09/2016   Procedure: COLONOSCOPY WITH PROPOFOL;  Surgeon: Lollie Sails, MD;  Location: [facility] ENDOSCOPY;  Service: Endoscopy;  Laterality: N/A;   COLONOSCOPY WITH PROPOFOL N/A 07/06/2017   Procedure: COLONOSCOPY WITH PROPOFOL;  Surgeon: Lollie Sails, MD;  Location: [facility] ENDOSCOPY;  Service: Endoscopy;  Laterality: N/A;   ESOPHAGOGASTRODUODENOSCOPY N/A 05/15/2021   Procedure: ESOPHAGOGASTRODUODENOSCOPY (EGD);  Surgeon: Annamaria Helling, DO;  Location: [facility] ENDOSCOPY;  Service: Gastroenterology;  Laterality: N/A;  DM   ESOPHAGOGASTRODUODENOSCOPY (EGD) WITH PROPOFOL N/A 11/09/2016   Procedure: ESOPHAGOGASTRODUODENOSCOPY (EGD) WITH PROPOFOL;  Surgeon: Lollie Sails, MD;  Location[facility] ENDOSCOPY;  Service: Endoscopy;  Laterality: N/A;   ESOPHAGOGASTRODUODENOSCOPY (EGD) WITH PROPOFOL N/A 07/06/2017   Procedure: ESOPHAGOGASTRODUODENOSCOPY (EGD) WITH PROPOFOL;  Surgeon: Lollie Sails, MD;  Location[facility] ENDOSCOPY;  Service: Endoscopy;  Laterality: N/A;   Social History:  reports that she has been smoking cigarettes. She has a 22.50 pack-year smoking history. She has never used smokeless tobacco. She reports that she does not drink alcohol and does not use drugs.  Allergies  Allergen Reactions   Shellfish Allergy Swelling   Codeine Rash   Lisinopril Cough    Family History  Problem Relation Age of Onset   Diabetes Mother  Diabetes Father    Stomach cancer Father    Heart attack Father    Alcohol abuse Father    Heart attack Sister    Heart attack Brother    Heart attack Paternal Aunt    Heart attack Paternal Uncle     Prior to Admission medications   Medication Sig  Start Date End Date Taking? Authorizing Provider  albuterol (VENTOLIN HFA) 108 (90 Base) MCG/ACT inhaler Inhale 2 puffs into the lungs every 4 (four) hours as needed for wheezing or shortness of breath.     [provider]  ARIPiprazole (ABILIFY) 2 MG tablet Take 1 tablet (2 mg total) by mouth daily. 12/31/20 01/30/21  Norman Clay, MD  aspirin-acetaminophen-caffeine (EXCEDRIN MIGRAINE) [phone] MG tablet Take by mouth every 6 (six) hours as needed for headache.    [provider]  atorvastatin (LIPITOR) 40 MG tablet Take 40 mg by mouth daily. 08/04/21   [provider]  buPROPion (WELLBUTRIN XL) 300 MG 24 hr tablet Take 1 tablet (300 mg total) by mouth daily. 01/02/21 02/01/21  Norman Clay, MD  COMBIVENT RESPIMAT 20-100 MCG/ACT AERS respimat Inhale 1 puff into the lungs 4 (four) times daily. 07/16/21   [provider]  escitalopram (LEXAPRO) 20 MG tablet Take 20 mg by mouth daily. 10/06/19 08/26/21  [provider]  estradiol (ESTRACE) 2 MG tablet Take 2 mg by mouth in the morning and at bedtime. 03/13/20   [provider]  famotidine (PEPCID) 40 MG tablet Take 40 mg by mouth daily. 06/20/21   [provider]  gabapentin (NEURONTIN) 600 MG tablet Take 600 mg by mouth 3 (three) times daily. 07/23/21   [provider]  hydrochlorothiazide (MICROZIDE) 12.5 MG capsule Take 12.5 mg by mouth daily.    [provider]  hydrocortisone 2.5 % lotion APPLY TO AFFECTEDAREA RASH ON BUTTOCKS 3 DAYS PER WEEK. TUESDAY, THURSDAY, SATURDAY, AND AS NEEDED FOR FLARES 07/16/20   Ralene Bathe, MD  loratadine (CLARITIN) 10 MG tablet Take 10 mg by mouth daily.    [provider]  meloxicam (MOBIC) 15 MG tablet Take 1 tablet (15 mg total) by mouth daily. 11/15/19 10/02/20  Milinda Pointer, MD  metFORMIN (GLUCOPHAGE) 1000 MG tablet Take 1,000 mg by mouth 2 (two) times daily with a meal.    [provider]  oxybutynin (DITROPAN) 5  MG tablet Take 5 mg by mouth every 12 (twelve) hours.    [provider]  pantoprazole (PROTONIX) 40 MG tablet Take 1 tablet (40 mg total) by mouth daily. 01/19/21 01/19/22  Cuthriell, Charline Bills, PA-C  simethicone (MYLICON) 287 MG chewable tablet Chew 125 mg by mouth every 6 (six) hours as needed for flatulence.    [provider]  spironolactone (ALDACTONE) 50 MG tablet Take 50 mg by mouth 2 (two) times daily. 03/13/20   [provider]  tiZANidine (ZANAFLEX) 4 MG tablet Take 1 tablet (4 mg total) by mouth every 8 (eight) hours as needed for muscle spasms. 11/15/19 10/02/20  Milinda Pointer, MD  TRELEGY ELLIPTA 100-62.5-25 MCG/INH AEPB Inhale 1 puff into the lungs daily. 09/20/20   [provider]    Physical Exam: Vitals:   11/09/21 1208 11/09/21 1313 11/09/21 1710  BP: 126/69 (!) 144/78 137/81  Pulse: (!) 120 (!) 128 (!) 109  Resp: (!) 24 (!) 35 20  Temp: 98.7 F (37.1 C)  98.5 F (36.9 C)  TempSrc: Oral  Axillary  SpO2: (!) 83% (!) 87% 95%  Weight:  128 kg  Height:   5' 7.99" (1.727 m)   Physical Exam Vitals and nursing note reviewed.  Constitutional:      General: She is not in acute distress.    Appearance: She is obese. She is not toxic-appearing.  HENT:     Head: Normocephalic and atraumatic.  Eyes:     Extraocular Movements: Extraocular movements intact.     Pupils: Pupils are equal, round, and reactive to light.  Cardiovascular:     Rate and Rhythm: Regular rhythm. Tachycardia present.     Heart sounds: No murmur heard.    No gallop.  Pulmonary:     Effort: Tachypnea present.     Breath sounds: Decreased air movement present. Rhonchi (Diffuse rhonchi throughout) present. No decreased breath sounds or wheezing.  Abdominal:     Palpations: Abdomen is soft.     Tenderness: There is no abdominal tenderness.  Musculoskeletal:     Right lower leg: Tenderness present. Edema present.     Left lower leg: Tenderness present. Edema present.      Comments: Bilateral +1 edema with tenderness to palpation, chronic.  Skin:    General: Skin is warm and dry.     Comments: Hyperpigmented changes of bilateral lower extremities  Neurological:     General: No focal deficit present.     Mental Status: She is alert and oriented to person, place, and time.  Psychiatric:        Mood and Affect: Mood normal.        Behavior: Behavior normal.    Data Reviewed: CBC notable for hemoglobin of 12.8.  BMP notable for sodium of 134, chloride of 97, glucose of 126, creatinine 1.28 with GFR over 60.  Troponin negative x2.  BNP within normal limits.  Respiratory panel negative.  Hepatic function panel and lipase negative.  Procalcitonin low at less than 0.10.  Chest x-ray personally reviewed.  Flattened diaphragm consistent with history of COPD.  No focal opacities, no pleural effusions  EKG personally reviewed.  Consistent with sinus tachycardia with a rate of 121.  No acute ST or T wave changes concerning for acute ischemia.  Q waves noted in lead III.  CT Angio Chest PE W and/or Wo Contrast  Result Date: 11/09/2021 CLINICAL DATA:  Worsening shortness of breath. Clinical concern for pulmonary embolus. EXAM: CT ANGIOGRAPHY CHEST WITH CONTRAST TECHNIQUE: Multidetector CT imaging of the chest was performed using the standard protocol during bolus administration of intravenous contrast. Multiplanar CT image reconstructions and MIPs were obtained to evaluate the vascular anatomy. RADIATION DOSE REDUCTION: This exam was performed according to the departmental dose-optimization program which includes automated exposure control, adjustment of the mA and/or kV according to patient size and/or use of iterative reconstruction technique. CONTRAST:  80m OMNIPAQUE IOHEXOL 350 MG/ML SOLN COMPARISON:  08/25/2021 FINDINGS: Cardiovascular: The heart size is normal. No substantial pericardial effusion. Mild atherosclerotic calcification is noted in the wall of the thoracic  aorta. Enlargement of the pulmonary outflow tract/main pulmonary arteries suggests pulmonary arterial hypertension. There is no filling defect within the opacified pulmonary arteries to suggest the presence of an acute pulmonary embolus. Mediastinum/Nodes: Upper normal lymph nodes in the mediastinum and both hilar regions are similar to prior including 12 mm short axis subcarinal node on 82/4. The esophagus has normal imaging features. There is no axillary lymphadenopathy. Lungs/Pleura: There is diffuse tree-in-bud nodularity in the right upper lobe, right middle lobe, and right lower lobe. Similar but perhaps slightly less advanced changes  are noted in the left upper and lower lobes. No dense focal airspace consolidation no pulmonary edema or pleural effusion. Upper Abdomen: Calcified gallstones evident. 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 No followup imaging is recommended. Musculoskeletal: No worrisome lytic or sclerotic osseous abnormality. Review of the MIP images confirms the above findings. IMPRESSION: 1. No CT evidence for acute pulmonary embolus. 2. Enlargement of the pulmonary outflow tract/main pulmonary arteries suggests pulmonary arterial hypertension. 3. Interval development of diffuse tree-in-bud nodularity involving all lobes of both lungs. While nonspecific, this can be seen in the setting of inflammatory or infectious bronchiolitis (including MAI). Follow-up CT chest without contrast in 3 months recommended to ensure resolution. 4. Borderline mediastinal and hilar lymphadenopathy, likely reactive. 5. Cholelithiasis. 6. 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 No followup imaging is recommended. 7.  Aortic Atherosclerosis (ICD10-I70.0). Electronically Signed   By: Misty Stanley M.D.   On: 11/09/2021 14:36   DG Chest 2 View  Result Date: 11/09/2021 CLINICAL DATA:  Shortness of breath EXAM: CHEST - 2 VIEW COMPARISON:  08/25/2021 FINDINGS: The heart size and mediastinal contours are  within normal limits. Pulmonary vascular prominence and mild diffuse interstitial opacity. Disc degenerative disease of the thoracic spine. IMPRESSION: Pulmonary vascular prominence and mild diffuse interstitial opacity, consistent with edema. No focal airspace opacity. Electronically Signed   By: Delanna Ahmadi M.D.   On: 11/09/2021 13:15    Results are pending, will review when available.  Assessment and Plan: * COPD with acute exacerbation [facility]) Patient presenting with 1 week of increased dyspnea, cough and sputum production requiring increased use of home inhalers.  Initially able to tolerate supplemental oxygen via nasal cannula only, however due to increased work of breathing when oxygen was discontinued, patient has been placed on BiPAP and tolerating well.  CT imaging notable for tree-in-bud nodularity concerning for infectious bronchiolitis.  Given history of COPD and active smoking, will obtain sputum cultures.  -Continue supplemental oxygen as needed to maintain oxygen saturation above 88% - Continue BiPAP - Wean both as tolerated - DuoNebs every 6 hours - S/p Solu-Medrol 125 mg.  Start prednisone 40 mg daily tomorrow to complete a 5-day course - Given severity of exacerbation, will continue CAP coverage with ceftriaxone and azithromycin.  Can likely discontinue once clinically improved - Sputum cultures pending  Stable angina Ms. Nash Mantis endorses at least 1 month history of chest pain with exertion that does not occur at rest.  Per chart review, normal stress test in 2021.  Given this is a new onset, will obtain an echocardiogram, however I have counseled Ms. Alms that she will need to establish with cardiology.  - Echocardiogram pending  Essential hypertension - Restart home antihypertensives  DM2 (diabetes mellitus, type 2) (HCC) - SSI, moderate  Depression - Restart home Lexapro, Wellbutrin and Abilify  Neurogenic bladder - Restart home oxybutynin   Advance Care  Planning:   Code Status: Full Code   Consults: None  Family Communication: No family at bedside  Severity of Illness: The appropriate patient status for this patient is OBSERVATION. Observation status is judged to be reasonable and necessary in order to provide the required intensity of service to ensure the patient's safety. The patient's presenting symptoms, physical exam findings, and initial radiographic and laboratory data in the context of their medical condition is felt to place them at decreased risk for further clinical deterioration. Furthermore, it is anticipated that the patient will be medically stable for discharge from the hospital within  2 midnights of admission.   Author: Jose Persia, MD 11/09/2021 6:04 PM  For on call review [email or web address].

## 2021-11-09 NOTE — Assessment & Plan Note (Signed)
-   Restart home oxybutynin

## 2021-11-10 ENCOUNTER — Encounter: Payer: Self-pay | Admitting: Internal Medicine

## 2021-11-10 ENCOUNTER — Encounter (INDEPENDENT_AMBULATORY_CARE_PROVIDER_SITE_OTHER): Payer: Self-pay

## 2021-11-10 ENCOUNTER — Observation Stay
Admit: 2021-11-10 | Discharge: 2021-11-10 | Disposition: A | Discharge status: Home / Self Care | Payer: Medicare Other | Attending: Internal Medicine | Admitting: Internal Medicine

## 2021-11-10 DIAGNOSIS — Z8249 Family history of ischemic heart disease and other diseases of the circulatory system: Secondary | ICD-10-CM | POA: Diagnosis not present

## 2021-11-10 DIAGNOSIS — R778 Other specified abnormalities of plasma proteins: Secondary | ICD-10-CM

## 2021-11-10 DIAGNOSIS — Z833 Family history of diabetes mellitus: Secondary | ICD-10-CM | POA: Diagnosis not present

## 2021-11-10 DIAGNOSIS — E1169 Type 2 diabetes mellitus with other specified complication: Secondary | ICD-10-CM | POA: Diagnosis not present

## 2021-11-10 DIAGNOSIS — Z79899 Other long term (current) drug therapy: Secondary | ICD-10-CM | POA: Diagnosis not present

## 2021-11-10 DIAGNOSIS — Z6838 Body mass index (BMI) 38.0-38.9, adult: Secondary | ICD-10-CM | POA: Diagnosis not present

## 2021-11-10 DIAGNOSIS — Z885 Allergy status to narcotic agent status: Secondary | ICD-10-CM | POA: Diagnosis not present

## 2021-11-10 DIAGNOSIS — I10 Essential (primary) hypertension: Secondary | ICD-10-CM

## 2021-11-10 DIAGNOSIS — I25110 Atherosclerotic heart disease of native coronary artery with unstable angina pectoris: Secondary | ICD-10-CM | POA: Diagnosis present

## 2021-11-10 DIAGNOSIS — F329 Major depressive disorder, single episode, unspecified: Secondary | ICD-10-CM | POA: Diagnosis present

## 2021-11-10 DIAGNOSIS — Z91013 Allergy to seafood: Secondary | ICD-10-CM | POA: Diagnosis not present

## 2021-11-10 DIAGNOSIS — Z800 Family history of malignant neoplasm of digestive organs: Secondary | ICD-10-CM | POA: Diagnosis not present

## 2021-11-10 DIAGNOSIS — J441 Chronic obstructive pulmonary disease with (acute) exacerbation: Secondary | ICD-10-CM | POA: Diagnosis present

## 2021-11-10 DIAGNOSIS — N319 Neuromuscular dysfunction of bladder, unspecified: Secondary | ICD-10-CM | POA: Diagnosis present

## 2021-11-10 DIAGNOSIS — F640 Transsexualism: Secondary | ICD-10-CM | POA: Diagnosis present

## 2021-11-10 DIAGNOSIS — I214 Non-ST elevation (NSTEMI) myocardial infarction: Secondary | ICD-10-CM | POA: Diagnosis present

## 2021-11-10 DIAGNOSIS — Z7984 Long term (current) use of oral hypoglycemic drugs: Secondary | ICD-10-CM | POA: Diagnosis not present

## 2021-11-10 DIAGNOSIS — E1142 Type 2 diabetes mellitus with diabetic polyneuropathy: Secondary | ICD-10-CM | POA: Diagnosis present

## 2021-11-10 DIAGNOSIS — Z811 Family history of alcohol abuse and dependence: Secondary | ICD-10-CM | POA: Diagnosis not present

## 2021-11-10 DIAGNOSIS — Z7951 Long term (current) use of inhaled steroids: Secondary | ICD-10-CM | POA: Diagnosis not present

## 2021-11-10 DIAGNOSIS — R0602 Shortness of breath: Secondary | ICD-10-CM | POA: Diagnosis present

## 2021-11-10 DIAGNOSIS — Z20822 Contact with and (suspected) exposure to covid-19: Secondary | ICD-10-CM | POA: Diagnosis present

## 2021-11-10 DIAGNOSIS — I2510 Atherosclerotic heart disease of native coronary artery without angina pectoris: Secondary | ICD-10-CM | POA: Diagnosis not present

## 2021-11-10 DIAGNOSIS — J9621 Acute and chronic respiratory failure with hypoxia: Secondary | ICD-10-CM | POA: Diagnosis present

## 2021-11-10 DIAGNOSIS — E785 Hyperlipidemia, unspecified: Secondary | ICD-10-CM | POA: Diagnosis present

## 2021-11-10 DIAGNOSIS — Z5900 Homelessness unspecified: Secondary | ICD-10-CM | POA: Diagnosis not present

## 2021-11-10 DIAGNOSIS — F17210 Nicotine dependence, cigarettes, uncomplicated: Secondary | ICD-10-CM | POA: Diagnosis present

## 2021-11-10 LAB — BASIC METABOLIC PANEL
Anion gap: 10 (ref 5–15)
BUN: 20 mg/dL (ref 6–20)
CO2: 24 mmol/L (ref 22–32)
Calcium: 9.2 mg/dL (ref 8.9–10.3)
Chloride: 99 mmol/L (ref 98–111)
Creatinine, Ser: 1.36 mg/dL — ABNORMAL HIGH (ref 0.61–1.24)
GFR, Estimated: >60 mL/min (ref >60)
Glucose, Bld: 177 mg/dL — ABNORMAL HIGH (ref 70–99)
Potassium: 4.8 mmol/L (ref 3.5–5.1)
Sodium: 133 mmol/L — ABNORMAL LOW (ref 135–145)

## 2021-11-10 LAB — HEPARIN LEVEL (UNFRACTIONATED)
Heparin Unfractionated: 0.36 IU/mL (ref 0.30–0.70)
Heparin Unfractionated: 0.2 IU/mL — ABNORMAL LOW (ref 0.30–0.70)

## 2021-11-10 LAB — ECHOCARDIOGRAM COMPLETE
AR max vel: 2.52 cm2
AV Area VTI: 2.42 cm2
AV Area mean vel: 2.38 cm2
AV Mean grad: 5 mmHg
AV Peak grad: 9.4 mmHg
Ao pk vel: 1.53 m/s
Area-P 1/2: 4.41 cm2
Height: 67.992 in
S' Lateral: 2.9 cm
Weight: 4515.02 oz

## 2021-11-10 LAB — CBC
HCT: 40 % (ref 39.0–52.0)
Hemoglobin: 13 g/dL (ref 13.0–17.0)
MCH: 26.4 pg (ref 26.0–34.0)
MCHC: 32.5 g/dL (ref 30.0–36.0)
MCV: 81.3 fL (ref 80.0–100.0)
Platelets: 304 10*3/uL (ref 150–400)
RBC: 4.92 MIL/uL (ref 4.22–5.81)
RDW: 15.1 % (ref 11.5–15.5)
WBC: 8.5 10*3/uL (ref 4.0–10.5)
nRBC: 0 % (ref 0.0–0.2)

## 2021-11-10 LAB — CBG MONITORING, ED
Glucose-Capillary: 123 mg/dL — ABNORMAL HIGH (ref 70–99)
Glucose-Capillary: 149 mg/dL — ABNORMAL HIGH (ref 70–99)

## 2021-11-10 LAB — APTT: aPTT: 32 seconds (ref 24–36)

## 2021-11-10 LAB — TROPONIN I (HIGH SENSITIVITY)
Troponin I (High Sensitivity): 20 ng/L — ABNORMAL HIGH (ref <18)
Troponin I (High Sensitivity): 126 ng/L (ref <18)
Troponin I (High Sensitivity): 229 ng/L (ref <18)

## 2021-11-10 LAB — PROTIME-INR
INR: 1.1 (ref 0.8–1.2)
Prothrombin Time: 14.4 seconds (ref 11.4–15.2)

## 2021-11-10 LAB — EXPECTORATED SPUTUM ASSESSMENT W GRAM STAIN, RFLX TO RESP C

## 2021-11-10 LAB — GLUCOSE, CAPILLARY: Glucose-Capillary: 157 mg/dL — ABNORMAL HIGH (ref 70–99)

## 2021-11-10 LAB — HIV ANTIBODY (ROUTINE TESTING W REFLEX): HIV Screen 4th Generation wRfx: NONREACTIVE

## 2021-11-10 MED ORDER — UMECLIDINIUM BROMIDE 62.5 MCG/ACT IN AEPB
1.0000 | INHALATION_SPRAY | Freq: Every day | RESPIRATORY_TRACT | Status: DC
  Administered 2021-11-12 – 2021-11-14 (×3): 1 via RESPIRATORY_TRACT
  Filled 2021-11-10: qty 7

## 2021-11-10 MED ORDER — SODIUM CHLORIDE 0.9 % IV SOLN: 250.0000 mL | INTRAVENOUS | Status: DC | PRN

## 2021-11-10 MED ORDER — ASPIRIN 325 MG PO TABS
325.0000 mg | ORAL_TABLET | Freq: Once | ORAL | Status: AC
  Administered 2021-11-10: 325 mg via ORAL
  Filled 2021-11-10: qty 1

## 2021-11-10 MED ORDER — NITROGLYCERIN 0.4 MG SL SUBL
0.4000 mg | SUBLINGUAL_TABLET | SUBLINGUAL | Status: DC | PRN
  Administered 2021-11-10 – 2021-11-13 (×6): 0.4 mg via SUBLINGUAL
  Filled 2021-11-10 (×6): qty 1

## 2021-11-10 MED ORDER — FLUTICASONE FUROATE-VILANTEROL 100-25 MCG/ACT IN AEPB
1.0000 | INHALATION_SPRAY | Freq: Every day | RESPIRATORY_TRACT | Status: DC
  Administered 2021-11-12 – 2021-11-14 (×3): 1 via RESPIRATORY_TRACT
  Filled 2021-11-10: qty 28

## 2021-11-10 MED ORDER — SODIUM CHLORIDE 0.9 % IV SOLN: Freq: Once | INTRAVENOUS | Status: AC

## 2021-11-10 MED ORDER — HEPARIN (PORCINE) 25000 UT/250ML-% IV SOLN
1700.0000 [IU]/h | INTRAVENOUS | Status: DC
  Administered 2021-11-10 (×2): 1300 [IU]/h via INTRAVENOUS
  Filled 2021-11-10 (×2): qty 250

## 2021-11-10 MED ORDER — HEPARIN BOLUS VIA INFUSION
1500.0000 [IU] | Freq: Once | INTRAVENOUS | Status: AC
  Administered 2021-11-11: 1500 [IU] via INTRAVENOUS
  Filled 2021-11-10: qty 1500

## 2021-11-10 MED ORDER — HEPARIN BOLUS VIA INFUSION
4000.0000 [IU] | Freq: Once | INTRAVENOUS | Status: AC
  Administered 2021-11-10: 4000 [IU] via INTRAVENOUS
  Filled 2021-11-10: qty 4000

## 2021-11-10 MED ORDER — SODIUM CHLORIDE 0.9 % WEIGHT BASED INFUSION
1.0000 mL/kg/h | INTRAVENOUS | Status: DC
  Administered 2021-11-11: 1 mL/kg/h via INTRAVENOUS

## 2021-11-10 MED ORDER — SODIUM CHLORIDE 0.9% FLUSH
3.0000 mL | Freq: Two times a day (BID) | INTRAVENOUS | Status: DC
  Administered 2021-11-11 – 2021-11-13 (×5): 3 mL via INTRAVENOUS

## 2021-11-10 MED ORDER — LIDOCAINE 5 % EX PTCH
1.0000 | MEDICATED_PATCH | CUTANEOUS | Status: DC
  Filled 2021-11-10 (×4): qty 1

## 2021-11-10 MED ORDER — SODIUM CHLORIDE 0.9% FLUSH: 3.0000 mL | INTRAVENOUS | Status: DC | PRN

## 2021-11-10 MED ORDER — SODIUM CHLORIDE 0.9 % WEIGHT BASED INFUSION
3.0000 mL/kg/h | INTRAVENOUS | Status: DC
  Administered 2021-11-11: 3 mL/kg/h via INTRAVENOUS

## 2021-11-10 MED ORDER — ASPIRIN 81 MG PO CHEW
81.0000 mg | CHEWABLE_TABLET | ORAL | Status: AC
  Administered 2021-11-11: 81 mg via ORAL
  Filled 2021-11-10: qty 1

## 2021-11-10 MED ORDER — MORPHINE SULFATE (PF) 2 MG/ML IV SOLN: 2.0000 mg | Freq: Once | INTRAVENOUS | Status: DC

## 2021-11-10 MED ORDER — PERFLUTREN LIPID MICROSPHERE
1.0000 mL | INTRAVENOUS | Status: AC | PRN
  Administered 2021-11-10: 2 mL via INTRAVENOUS

## 2021-11-10 NOTE — Progress Notes (Signed)
OT Cancellation Note  Patient Details Name: [name]. MRN: [id number] DOB: 1965-03-29   Cancelled Treatment:    Reason Eval/Treat Not Completed: Medical issues which prohibited therapy. Consult received, chart reviewed. Pt noted to have increasing troponin levels & started on IV heparin. Will hold OT evaluation at this time and re-attempt at later date/time as medically appropriate.  Sharlene Motts Dyke Weible 11/10/2021, 8:48 AM

## 2021-11-10 NOTE — Progress Notes (Addendum)
       CROSS COVER NOTE  NAME: [name]. MRN: [id number] DOB : 1965-04-29 ATTENDING PHYSICIAN: Jose Persia, MD    Date of Service   11/10/2021   HPI/Events of Note   M(r)s [name] is reporting 10/10 mid sternal chest pain described as somebody sitting on her chest. Per H&P patient has had ongoing chest pain with exertion for one month. However, M(r)s [name] reports a change in intensity of the pain tonight and the pain occurred at rest. The pain is reproducible on palpation. She endorses dyspnea and fatigue. Denies palpitations, dizziness, abdominal pain, nausea, vomiting, radiation of pain.   Interventions   Assessment/Plan:  Repeated EKG - unchanged from prior SL Nitro - mild improvement in pain Troponin - 12-->17-->20-->126-->229 '325mg'$  Aspirin Heparin per pharmacy Morphine/Lidocaine patch Cardiology consulted      This document was prepared using Dragon voice recognition software and may include unintentional dictation errors.  Neomia Glass DNP, MBA, FNP-BC Nurse Practitioner [facility] Pager [phone]

## 2021-11-10 NOTE — Progress Notes (Signed)
PROGRESS NOTE    [name].  XNA:[id number] DOB: 1965-12-06 DOA: 11/09/2021  PCP: Howard Pouch, NP   Brief Narrative: This 56 years old male with PMH significant for COPD, type 2 diabetes, CAD, hypertension, hyperlipidemia presented in the ED with complaints of worsening shortness of breath.  She has developed shortness of breath and productive cough for about a week.  She has been using her regular inhalers more frequently without any improvement.  Patient also reports chest pain occurring with cough.  She also reports chest pain when she exerts herself but this has been ongoing for more than a month.  She was hypoxic with SPO2 of 83% on room air placed on 4 L of supplemental oxygen.  CT chest ruled out PE however shows interval development of diffuse tree-in-bud nodularity involving all lobes of both lungs.  Patient is admitted for acute hypoxic respiratory failure likely secondary to COPD exacerbation.  Assessment & Plan:   Principal Problem:   COPD with acute exacerbation ([place]) Active Problems:   Stable angina   Essential hypertension   DM2 (diabetes mellitus, type 2) ([place])   Neurogenic bladder   Depression  Acute hypoxic respiratory failure: COPD exacerbation: Patient presenting with 1 week history of increased shortness of breath , productive cough requiring increased use of home inhalers. Initially able to tolerate supplemental oxygen, however due to increased work of breathing she was placed on BiPAP. CT imaging notable for tree-in-bud nodularity concerning for infectious bronchiolitis.   Given history of COPD and active smoking, will obtain sputum cultures. Continue supplemental oxygen as needed to maintain oxygen saturation above 88% Continue BiPAP at night and as needed.  Wean as tolerated Continue nebulized bronchodilators scheduled and as needed S/p Solu-Medrol 125 mg.  Start prednisone 40 mg daily tomorrow to complete a 5-day course Given severity of  exacerbation, will continue CAP coverage with ceftriaxone and azithromycin.   Can likely discontinue once clinically improved Sputum cultures pending   NSTEMI: Patient reports intermittent chest pain for 1 month with exertion. Troponin 12> 17> 20> 126>229 Per chart review, normal stress test in 2021. Echocardiogram shows LVEF 60 to 70% no regional wall motion abnormalities. Patient denies any chest pain. Continue IV heparin.  Cardiology is consulted Patient is a scheduled for left heart cath in the morning.  Essential hypertension: Currently blood pressure stable.  Continue home medications  Type 2 diabetes: Hold p.o. diabetic medications,  regular insulin sliding scale   Major depression: Resume Lexapro, Wellbutrin and Abilify   Neurogenic bladder Continue oxybutynin   DVT prophylaxis: Heparin gtt Code Status: Full code. Family Communication: No family at bed side. Disposition Plan:   Status is: Inpatient Remains inpatient appropriate because: Admitted for acute hypoxic respiratory failure secondary to COPD exacerbation,  also found to have NSTEMI requiring heparin drip,  scheduled for left heart cath in the morning   Consultants:  Cardiology  Procedures: scheduLed [facility] 10/31.  Antimicrobials:  Anti-infectives (From admission, onward)    Start     Dose/Rate Route Frequency Ordered Stop   11/10/21 1500  cefTRIAXone (ROCEPHIN) 2 g in sodium chloride 0.9 % 100 mL IVPB        2 g 200 mL/hr over 30 Minutes Intravenous Every 24 hours 11/09/21 1753 11/14/21 1459   11/10/21 1000  azithromycin (ZITHROMAX) tablet 500 mg        500 mg Oral Daily 11/09/21 1753 11/14/21 0959   11/09/21 1445  cefTRIAXone (ROCEPHIN) 2 g in sodium chloride 0.9 % 100  mL IVPB        2 g 200 mL/hr over 30 Minutes Intravenous  Once 11/09/21 1442 11/09/21 1543   11/09/21 1445  azithromycin (ZITHROMAX) tablet 500 mg  Status:  Discontinued        500 mg Oral Daily 11/09/21 1442 11/09/21 1754         Subjective: Patient was seen and examined at bedside.  Overnight events noted. Patient was sitting comfortably on the bed,  remains on 4 L of oxygen.  Objective: Vitals:   11/10/21 1030 11/10/21 1130 11/10/21 1200 11/10/21 1400  BP: 123/78   (!) 142/82  Pulse: (!) 112 (!) 103 (!) 104 (!) 108  Resp: (!) 25 (!) 23 (!) 23 20  Temp:    98.2 F (36.8 C)  TempSrc:    Oral  SpO2: 95% 96% 94% 93%  Weight:      Height:       No intake or output data in the 24 hours ending 11/10/21 1552  Filed Weights   11/09/21 1710  Weight: 128 kg    Examination:  General exam: Appears comfortable, not in any acute distress.  Deconditioned Respiratory system: Decreased breath sounds, respiratory effort normal, no accessory muscle use, RR 15 Cardiovascular system: S1 & S2 heard, regular rhythm, no murmur. Gastrointestinal system: Abdomen is soft, non tender, non distended, BS+ Central nervous system: Alert and oriented X 3. No focal neurological deficits. Extremities: No edema, no cyanosis, no clubbing Skin: No rashes, lesions or ulcers Psychiatry: Judgement and insight appear normal. Mood & affect appropriate.     Data Reviewed: I have personally reviewed following labs and imaging studies  CBC: Recent Labs  Lab 11/09/21 1230 11/10/21 0325  WBC 7.9 8.5  HGB 12.8* 13.0  HCT 39.1 40.0  MCV 80.5 81.3  PLT 309 161   Basic Metabolic Panel: Recent Labs  Lab 11/09/21 1230 11/10/21 0025  NA 134* 133*  K 3.9 4.8  CL 97* 99  CO2 25 24  GLUCOSE 126* 177*  BUN 17 20  CREATININE 1.28* 1.36*  CALCIUM 9.1 9.2   GFR: Estimated Creatinine Clearance (by C-G formula based on SCr of 1.36 mg/dL (H)) Male: 65.3 mL/min (A) Male: 79.1 mL/min (A) Liver Function Tests: Recent Labs  Lab 11/09/21 1230  AST 20  ALT 18  ALKPHOS 69  BILITOT 0.8  PROT 8.0  ALBUMIN 3.5   Recent Labs  Lab 11/09/21 1230  LIPASE 30   No results for input(s): "AMMONIA" in the last 168 hours. Coagulation  Profile: Recent Labs  Lab 11/10/21 0633  INR 1.1   Cardiac Enzymes: No results for input(s): "CKTOTAL", "CKMB", "CKMBINDEX", "TROPONINI" in the last 168 hours. BNP (last 3 results) No results for input(s): "PROBNP" in the last 8760 hours. HbA1C: No results for input(s): "HGBA1C" in the last 72 hours. CBG: Recent Labs  Lab 11/10/21 0730 11/10/21 1303  GLUCAP 123* 149*   Lipid Profile: No results for input(s): "CHOL", "HDL", "LDLCALC", "TRIG", "CHOLHDL", "LDLDIRECT" in the last 72 hours. Thyroid Function Tests: No results for input(s): "TSH", "T4TOTAL", "FREET4", "T3FREE", "THYROIDAB" in the last 72 hours. Anemia Panel: No results for input(s): "VITAMINB12", "FOLATE", "FERRITIN", "TIBC", "IRON", "RETICCTPCT" in the last 72 hours. Sepsis Labs: Recent Labs  Lab 11/09/21 1442  PROCALCITON <0.10    Recent Results (from the past 240 hour(s))  Resp Panel by RT-PCR (Flu A&B, Covid) Anterior Nasal Swab     Status: None   Collection Time: 11/09/21 12:30 PM   Specimen:  Anterior Nasal Swab  Result Value Ref Range Status   SARS Coronavirus 2 by RT PCR NEGATIVE NEGATIVE Final    Comment: (NOTE) SARS-CoV-2 target nucleic acids are NOT DETECTED.  The SARS-CoV-2 RNA is generally detectable in upper respiratory specimens during the acute phase of infection. The lowest concentration of SARS-CoV-2 viral copies this assay can detect is 138 copies/mL. A negative result does not preclude SARS-Cov-2 infection and should not be used as the sole basis for treatment or other patient management decisions. A negative result may occur with  improper specimen collection/handling, submission of specimen other than nasopharyngeal swab, presence of viral mutation(s) within the areas targeted by this assay, and inadequate number of viral copies(<138 copies/mL). A negative result must be combined with clinical observations, patient history, and epidemiological information. The expected result is  Negative.  Fact Sheet for Patients:  EntrepreneurPulse.com.au  Fact Sheet for Healthcare Providers:  IncredibleEmployment.be  This test is no t yet approved or cleared by the [place] FDA and  has been authorized for detection and/or diagnosis of SARS-CoV-2 by FDA under an Emergency Use Authorization (EUA). This EUA will remain  in effect (meaning this test can be used) for the duration of the COVID-19 declaration under Section 564(b)(1) of the Act, 21 U.S.C.section 360bbb-3(b)(1), unless the authorization is terminated  or revoked sooner.       Influenza A by PCR NEGATIVE NEGATIVE Final   Influenza B by PCR NEGATIVE NEGATIVE Final    Comment: (NOTE) The Xpert Xpress SARS-CoV-2/FLU/RSV plus assay is intended as an aid in the diagnosis of influenza from Nasopharyngeal swab specimens and should not be used as a sole basis for treatment. Nasal washings and aspirates are unacceptable for Xpert Xpress SARS-CoV-2/FLU/RSV testing.  Fact Sheet for Patients: EntrepreneurPulse.com.au  Fact Sheet for Healthcare Providers: IncredibleEmployment.be  This test is not yet approved or cleared by the [place] FDA and has been authorized for detection and/or diagnosis of SARS-CoV-2 by FDA under an Emergency Use Authorization (EUA). This EUA will remain in effect (meaning this test can be used) for the duration of the COVID-19 declaration under Section 564(b)(1) of the Act, 21 U.S.C. section 360bbb-3(b)(1), unless the authorization is terminated or revoked.  Performed at [facility], [address], [place], [place] [address]     Radiology Studies: ECHOCARDIOGRAM COMPLETE  Result Date: 11/10/2021    ECHOCARDIOGRAM REPORT   Patient Name:   [name]. Date of Exam: 11/10/2021 Medical Rec #:  [id number]            Height:       68.0 in Accession #:    [id number]           Weight:       282.2  lb Date of Birth:  08-May-1965            BSA:          2.366 m Patient Age:    72 years             BP:           138/92 mmHg Patient Gender: M                    HR:           102 bpm. Exam Location:  [facility] Procedure: 2D Echo, Color Doppler, Cardiac Doppler and Intracardiac            Opacification Agent Indications:  R07.9 Chest Pain  History:         Patient has prior history of Echocardiogram examinations, most                  recent 03/13/2019. CAD, COPD; Risk Factors:Hypertension,                  Diabetes, Dyslipidemia and Sleep Apnea.  Sonographer:     Charmayne Sheer Referring Phys:  [id number] Jose Persia Diagnosing Phys: Yolonda Kida MD  Sonographer Comments: Suboptimal parasternal window. Image acquisition challenging due to patient body habitus. IMPRESSIONS  1. Left ventricular ejection fraction, by estimation, is 65 to 70%. The left ventricle has normal function. The left ventricle has no regional wall motion abnormalities. Left ventricular diastolic parameters are consistent with Grade I diastolic dysfunction (impaired relaxation).  2. Right ventricular systolic function is normal. The right ventricular size is normal.  3. The mitral valve is normal in structure. Mild mitral valve regurgitation.  4. The aortic valve is normal in structure. Aortic valve regurgitation is trivial. FINDINGS  Left Ventricle: Left ventricular ejection fraction, by estimation, is 65 to 70%. The left ventricle has normal function. The left ventricle has no regional wall motion abnormalities. Definity contrast agent was given IV to delineate the left ventricular  endocardial borders. The left ventricular internal cavity size was normal in size. There is no left ventricular hypertrophy. Left ventricular diastolic parameters are consistent with Grade I diastolic dysfunction (impaired relaxation). Right Ventricle: The right ventricular size is normal. No increase in right ventricular wall thickness. Right ventricular systolic  function is normal. Left Atrium: Left atrial size was normal in size. Right Atrium: Right atrial size was normal in size. Pericardium: There is no evidence of pericardial effusion. Mitral Valve: The mitral valve is normal in structure. Mild mitral valve regurgitation. Tricuspid Valve: The tricuspid valve is normal in structure. Tricuspid valve regurgitation is mild. Aortic Valve: The aortic valve is normal in structure. Aortic valve regurgitation is trivial. Aortic valve mean gradient measures 5.0 mmHg. Aortic valve peak gradient measures 9.4 mmHg. Aortic valve area, by VTI measures 2.42 cm. Pulmonic Valve: The pulmonic valve was normal in structure. Pulmonic valve regurgitation is not visualized. Aorta: The ascending aorta was not well visualized. IAS/Shunts: No atrial level shunt detected by color flow Doppler.  LEFT VENTRICLE PLAX 2D LVIDd:         4.70 cm   Diastology LVIDs:         2.90 cm   LV e' medial:    10.00 cm/s LV PW:         1.00 cm   LV E/e' medial:  7.4 LV IVS:        0.90 cm   LV e' lateral:   10.90 cm/s LVOT diam:     1.90 cm   LV E/e' lateral: 6.8 LV SV:         58 LV SV Index:   24 LVOT Area:     2.84 cm  RIGHT VENTRICLE RV Basal diam:  3.40 cm TAPSE (M-mode): 3.0 cm LEFT ATRIUM           Index        RIGHT ATRIUM           Index LA diam:      3.80 cm 1.61 cm/m   RA Area:     14.80 cm LA Vol (A4C): 25.5 ml 10.78 ml/m  RA Volume:   36.60 ml  15.47 ml/m  AORTIC VALVE                     PULMONIC VALVE AV Area (Vmax):    2.52 cm      PV Vmax:       0.87 m/s AV Area (Vmean):   2.38 cm      PV Vmean:      61.400 cm/s AV Area (VTI):     2.42 cm      PV VTI:        0.125 m AV Vmax:           153.00 cm/s   PV Peak grad:  3.0 mmHg AV Vmean:          107.000 cm/s  PV Mean grad:  2.0 mmHg AV VTI:            0.238 m AV Peak Grad:      9.4 mmHg AV Mean Grad:      5.0 mmHg LVOT Vmax:         136.00 cm/s LVOT Vmean:        89.900 cm/s LVOT VTI:          0.203 m LVOT/AV VTI ratio: 0.85  AORTA Ao Root  diam: 2.50 cm MITRAL VALVE MV Area (PHT): 4.41 cm    SHUNTS MV Decel Time: 172 msec    Systemic VTI:  0.20 m MV E velocity: 74.50 cm/s  Systemic Diam: 1.90 cm MV A velocity: 80.80 cm/s MV E/A ratio:  0.92 Dwayne D Callwood MD Electronically signed by Yolonda Kida MD Signature Date/Time: 11/10/2021/1:17:28 PM    Final    CT Angio Chest PE W and/or Wo Contrast  Result Date: 11/09/2021 CLINICAL DATA:  Worsening shortness of breath. Clinical concern for pulmonary embolus. EXAM: CT ANGIOGRAPHY CHEST WITH CONTRAST TECHNIQUE: Multidetector CT imaging of the chest was performed using the standard protocol during bolus administration of intravenous contrast. Multiplanar CT image reconstructions and MIPs were obtained to evaluate the vascular anatomy. RADIATION DOSE REDUCTION: This exam was performed according to the departmental dose-optimization program which includes automated exposure control, adjustment of the mA and/or kV according to patient size and/or use of iterative reconstruction technique. CONTRAST:  51m OMNIPAQUE IOHEXOL 350 MG/ML SOLN COMPARISON:  08/25/2021 FINDINGS: Cardiovascular: The heart size is normal. No substantial pericardial effusion. Mild atherosclerotic calcification is noted in the wall of the thoracic aorta. Enlargement of the pulmonary outflow tract/main pulmonary arteries suggests pulmonary arterial hypertension. There is no filling defect within the opacified pulmonary arteries to suggest the presence of an acute pulmonary embolus. Mediastinum/Nodes: Upper normal lymph nodes in the mediastinum and both hilar regions are similar to prior including 12 mm short axis subcarinal node on 82/4. The esophagus has normal imaging features. There is no axillary lymphadenopathy. Lungs/Pleura: There is diffuse tree-in-bud nodularity in the right upper lobe, right middle lobe, and right lower lobe. Similar but perhaps slightly less advanced changes are noted in the left upper and lower lobes. No  dense focal airspace consolidation no pulmonary edema or pleural effusion. Upper Abdomen: Calcified gallstones evident. 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 No followup imaging is recommended. Musculoskeletal: No worrisome lytic or sclerotic osseous abnormality. Review of the MIP images confirms the above findings. IMPRESSION: 1. No CT evidence for acute pulmonary embolus. 2. Enlargement of the pulmonary outflow tract/main pulmonary arteries suggests pulmonary arterial hypertension. 3. Interval development of diffuse tree-in-bud nodularity involving all lobes of both lungs. While nonspecific, this  can be seen in the setting of inflammatory or infectious bronchiolitis (including MAI). Follow-up CT chest without contrast in 3 months recommended to ensure resolution. 4. Borderline mediastinal and hilar lymphadenopathy, likely reactive. 5. Cholelithiasis. 6. 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 No followup imaging is recommended. 7.  Aortic Atherosclerosis (ICD10-I70.0). Electronically Signed   By: Misty Stanley M.D.   On: 11/09/2021 14:36   DG Chest 2 View  Result Date: 11/09/2021 CLINICAL DATA:  Shortness of breath EXAM: CHEST - 2 VIEW COMPARISON:  08/25/2021 FINDINGS: The heart size and mediastinal contours are within normal limits. Pulmonary vascular prominence and mild diffuse interstitial opacity. Disc degenerative disease of the thoracic spine. IMPRESSION: Pulmonary vascular prominence and mild diffuse interstitial opacity, consistent with edema. No focal airspace opacity. Electronically Signed   By: Delanna Ahmadi M.D.   On: 11/09/2021 13:15    Scheduled Meds:  ARIPiprazole  5 mg Oral Daily   atorvastatin  40 mg Oral QPM   azithromycin  500 mg Oral Daily   escitalopram  20 mg Oral Daily   estradiol  2 mg Oral BID   famotidine  40 mg Oral Daily   [START ON 11/11/2021] fluticasone furoate-vilanterol  1 puff Inhalation Daily   And   [START ON 11/11/2021] umeclidinium bromide  1 puff  Inhalation Daily   gabapentin  600 mg Oral TID   hydrochlorothiazide  12.5 mg Oral Daily   insulin aspart  0-15 Units Subcutaneous TID WC   ipratropium-albuterol  3 mL Nebulization Q6H   lidocaine  1 patch Transdermal Q24H    morphine injection  2 mg Intravenous Once   multivitamin with minerals  1 tablet Oral Daily   nicotine  7 mg Transdermal Daily   oxybutynin  5 mg Oral BID   predniSONE  40 mg Oral Q breakfast   sodium chloride flush  3 mL Intravenous Q12H   sodium chloride flush  3 mL Intravenous Q12H   sodium chloride flush  3 mL Intravenous Q12H   spironolactone  75 mg Oral BID   Continuous Infusions:  cefTRIAXone (ROCEPHIN)  IV     heparin 1,300 Units/hr (11/10/21 0749)     LOS: 0 days    Time spent: [place], MD [facility]   If 7PM-7AM, please contact night-coverage

## 2021-11-10 NOTE — Consult Note (Signed)
Cardiology Consultation   Patient ID: [name]. MRN: [id number]; DOB: 01-15-1965  Admit date: 11/09/2021 Date of Consult: 11/10/2021  PCP:  Howard Pouch, NP   [facility] Cardiologist:  Kate Sable, MD        Patient Profile:   Jonathan Merrow. is a 56 y.o. adult with a hx of COPD, type II diabetes, coronary artery disease, hypertension, hyperlipidemia, current smoker, who is being seen 11/10/2021 for the evaluation of shortness of breath at the request of Morton Amy, NP.  History of Present Illness:   [name] is a 56 year old adult with the past medical history of COPD, type II diabetes, coronary artery disease, hypertension, hyperlipidemia, and current smoker.  [name] presented to the [facility] emergency department on 11/09/2021 with progressively worsening shortness fo breath with productive cough over the last week. She endorsed associated symptoms of nasal congestion. She denies any fever, chills, chest pain at rest but has had continued chest pain on exertion since 2021 according to outside note review.She described her chest pain as a tightness that has been increasing in frequency and intensity over the last month. She endorses chronic peripheral edema and discoloration. She did note that increasing the usage of her inhalers had helped with some of the shortness of breath. Unfortunately in the emergency department she required the use of CPAP as she continued to desaturate on nasal cannula.  She did have an episode of midsternal chest pain described as somebody sitting on her chest that she reported to be 10 out of 10 overnight.  The chest pain started at rest it was reproducible on palpation.  EKG was unchanged from prior and sublingual nitroglycerin was given with mild improvement.  She was also given 324 mg of aspirin and started on a heparin drip  Initial vitals: blood pressure  126/69, pulse 120, rr 24, temp 98.7  Pertinent labs:  Sodium 134, potassium 3.9, chloride 97, CO2 25, glucose 126, BUN of 17, creatinine of 1.28, with a GFR of greater than 60, WBCs of 7.9, hemoglobin 12.8, hematocrit of 39.1, with platelets of 309, BNP 44.9, respiratory panel was negative, high-sensitivity troponin trended to 12, 17, 20, 126, 229  Imaging: CT a of the chest revealed no evidence of PE however interval development of diffuse tree-in-bud nodularity involving all lobes of both lungs  Medications received in the emergency department: DuoNeb, methylprednisolone 125 mg IV push, aspirin, Nitrostat, heparin per pharmacy   Past Medical History:  Diagnosis Date   Carpal tunnel syndrome    bilateral   COPD (chronic obstructive pulmonary disease) (HCC)    Coronary artery disease    Diabetes mellitus without complication ([place])    Endotracheally intubated 09/30/2020   Hyperlipidemia    Hypertension    Neuropathy    On mechanically assisted ventilation ([facility]) 09/30/2020   Peptic ulcer    Sleep apnea     Past Surgical History:  Procedure Laterality Date   COLONOSCOPY WITH PROPOFOL N/A 11/09/2016   Procedure: COLONOSCOPY WITH PROPOFOL;  Surgeon: Lollie Sails, MD;  Location: [facility] ENDOSCOPY;  Service: Endoscopy;  Laterality: N/A;   COLONOSCOPY WITH PROPOFOL N/A 07/06/2017   Procedure: COLONOSCOPY WITH PROPOFOL;  Surgeon: Lollie Sails, MD;  Location: [facility] ENDOSCOPY;  Service: Endoscopy;  Laterality: N/A;   ESOPHAGOGASTRODUODENOSCOPY N/A 05/15/2021   Procedure: ESOPHAGOGASTRODUODENOSCOPY (EGD);  Surgeon: Annamaria Helling, DO;  Location: [facility] ENDOSCOPY;  Service: Gastroenterology;  Laterality: N/A;  DM   ESOPHAGOGASTRODUODENOSCOPY (EGD) WITH  PROPOFOL N/A 11/09/2016   Procedure: ESOPHAGOGASTRODUODENOSCOPY (EGD) WITH PROPOFOL;  Surgeon: Lollie Sails, MD;  Location: [facility] ENDOSCOPY;  Service: Endoscopy;  Laterality: N/A;   ESOPHAGOGASTRODUODENOSCOPY (EGD) WITH PROPOFOL N/A 07/06/2017   Procedure: ESOPHAGOGASTRODUODENOSCOPY  (EGD) WITH PROPOFOL;  Surgeon: Lollie Sails, MD;  Location: [facility] ENDOSCOPY;  Service: Endoscopy;  Laterality: N/A;     Home Medications:  Prior to Admission medications   Medication Sig Start Date End Date Taking? Authorizing Provider  albuterol (VENTOLIN HFA) 108 (90 Base) MCG/ACT inhaler Inhale 2 puffs into the lungs every 4 (four) hours as needed for wheezing or shortness of breath.    Yes [provider]  ARIPiprazole (ABILIFY) 5 MG tablet Take 5 mg by mouth daily. 10/22/21  Yes [provider]  aspirin-acetaminophen-caffeine (EXCEDRIN MIGRAINE) [phone] MG tablet Take by mouth every 6 (six) hours as needed for headache.   Yes [provider]  atorvastatin (LIPITOR) 40 MG tablet Take 40 mg by mouth daily. 08/04/21  Yes [provider]  escitalopram (LEXAPRO) 20 MG tablet Take 20 mg by mouth daily. 10/06/19 11/09/21 Yes [provider]  estradiol (ESTRACE) 2 MG tablet Take 2 mg by mouth in the morning and at bedtime. 03/13/20  Yes [provider]  famotidine (PEPCID) 40 MG tablet Take 40 mg by mouth daily. 06/20/21  Yes [provider]  gabapentin (NEURONTIN) 600 MG tablet Take 600 mg by mouth 3 (three) times daily. 07/23/21  Yes [provider]  hydrochlorothiazide (MICROZIDE) 12.5 MG capsule Take 12.5 mg by mouth daily.   Yes [provider]  hydrocortisone 2.5 % lotion APPLY TO AFFECTEDAREA RASH ON BUTTOCKS 3 DAYS PER WEEK. TUESDAY, THURSDAY, SATURDAY, AND AS NEEDED FOR FLARES 07/16/20  Yes Ralene Bathe, MD  ipratropium-albuterol (DUONEB) 0.5-2.5 (3) MG/3ML SOLN Inhale 3 mLs into the lungs 4 (four) times daily as needed for wheezing. 08/29/21 08/24/22 Yes [provider]  loratadine (CLARITIN) 10 MG tablet Take 10 mg by mouth daily.   Yes [provider]  metFORMIN (GLUCOPHAGE) 1000 MG tablet Take 1,000 mg by mouth 2 (two) times daily with a meal.   Yes [provider]  oxybutynin  (DITROPAN) 5 MG tablet Take 5 mg by mouth every 12 (twelve) hours.   Yes [provider]  pantoprazole (PROTONIX) 40 MG tablet Take 1 tablet (40 mg total) by mouth daily. 01/19/21 01/19/22 Yes Cuthriell, Charline Bills, PA-C  simethicone (MYLICON) 956 MG chewable tablet Chew 125 mg by mouth every 6 (six) hours as needed for flatulence.   Yes [provider]  spironolactone (ALDACTONE) 50 MG tablet Take 50 mg by mouth 2 (two) times daily. 03/13/20  Yes [provider]  tiZANidine (ZANAFLEX) 4 MG tablet Take 1 tablet (4 mg total) by mouth every 8 (eight) hours as needed for muscle spasms. 11/15/19 11/09/21 Yes Milinda Pointer, MD  TRELEGY ELLIPTA 100-62.5-25 MCG/INH AEPB Inhale 1 puff into the lungs daily. 09/20/20  Yes [provider]  buPROPion (WELLBUTRIN XL) 300 MG 24 hr tablet Take 1 tablet (300 mg total) by mouth daily. 01/02/21 02/01/21  Norman Clay, MD  meloxicam (MOBIC) 15 MG tablet Take 1 tablet (15 mg total) by mouth daily. 11/15/19 11/09/21  Milinda Pointer, MD    Inpatient Medications: Scheduled Meds:  ARIPiprazole  5 mg Oral Daily   atorvastatin  40 mg Oral QPM   azithromycin  500 mg Oral Daily   escitalopram  20 mg Oral Daily   estradiol  2 mg Oral  BID   famotidine  40 mg Oral Daily   [START ON 11/11/2021] fluticasone furoate-vilanterol  1 puff Inhalation Daily   And   [START ON 11/11/2021] umeclidinium bromide  1 puff Inhalation Daily   gabapentin  600 mg Oral TID   hydrochlorothiazide  12.5 mg Oral Daily   insulin aspart  0-15 Units Subcutaneous TID WC   ipratropium-albuterol  3 mL Nebulization Q6H   lidocaine  1 patch Transdermal Q24H    morphine injection  2 mg Intravenous Once   multivitamin with minerals  1 tablet Oral Daily   nicotine  7 mg Transdermal Daily   oxybutynin  5 mg Oral BID   predniSONE  40 mg Oral Q breakfast   sodium chloride flush  3 mL Intravenous Q12H   sodium chloride flush  3 mL Intravenous Q12H   spironolactone  75  mg Oral BID   Continuous Infusions:  cefTRIAXone (ROCEPHIN)  IV     heparin 1,300 Units/hr (11/10/21 0749)   PRN Meds: acetaminophen **OR** acetaminophen, nitroGLYCERIN, ondansetron **OR** ondansetron (ZOFRAN) IV, polyethylene glycol  Allergies:    Allergies  Allergen Reactions   Shellfish Allergy Swelling   Codeine Rash   Lisinopril Cough    Social History:   Social History   Socioeconomic History   Marital status: Single    Spouse name: Not on file   Number of children: Not on file   Years of education: Not on file   Highest education level: Not on file  Occupational History   Not on file  Tobacco Use   Smoking status: Every Day    Packs/day: 0.75    Years: 30.00    Total pack years: 22.50    Types: Cigarettes    Last attempt to quit: 08/13/2019    Years since quitting: 2.2   Smokeless tobacco: Never   Tobacco comments:    trying decrease  Vaping Use   Vaping Use: Never used  Substance and Sexual Activity   Alcohol use: No   Drug use: No   Sexual activity: Not Currently  Other Topics Concern   Not on file  Social History Narrative   Not on file   Social Determinants of Health   Financial Resource Strain: Not on file  Food Insecurity: Not on file  Transportation Needs: Not on file  Physical Activity: Not on file  Stress: Not on file  Social Connections: Not on file  Intimate Partner Violence: Not on file    Family History:    Family History  Problem Relation Age of Onset   Diabetes Mother    Diabetes Father    Stomach cancer Father    Heart attack Father    Alcohol abuse Father    Heart attack Sister    Heart attack Brother    Heart attack Paternal Aunt    Heart attack Paternal Uncle      ROS:  Please see the history of present illness.  Review of Systems  Constitutional:  Positive for malaise/fatigue.  Respiratory:  Positive for cough, shortness of breath and wheezing.   Cardiovascular:  Positive for chest pain and leg swelling.   Neurological:  Positive for weakness.    All other ROS reviewed and negative.     Physical Exam/Data:   Vitals:   11/10/21 1030 11/10/21 1130 11/10/21 1200 11/10/21 1400  BP: 123/78   (!) 142/82  Pulse: (!) 112 (!) 103 (!) 104 (!) 108  Resp: (!) 25 (!) 23 (!) 23 20  Temp:    98.2 F (36.8 C)  TempSrc:    Oral  SpO2: 95% 96% 94% 93%  Weight:      Height:        Intake/Output Summary (Last 24 hours) at 11/10/2021 1539 Last data filed at 11/09/2021 1543 Gross per 24 hour  Intake 100 ml  Output --  Net 100 ml      11/09/2021    5:10 PM 08/25/2021   11:38 AM 05/15/2021   11:17 AM  Last 3 Weights  Weight (lbs) 282 lb 3 oz 294 lb 298 lb  Weight (kg) 128 kg 133.358 kg 135.172 kg     Body mass index is 42.92 kg/m.  General:  Well nourished, well developed, in no acute distress HEENT: normal Neck: no JVD pressured, difficult to assess due to CPAP and body habitus Vascular: No carotid bruits; Distal pulses 2+ bilaterally Cardiac:  normal S1, S2; RRR; no murmur  Lungs:  clear to the upper lobes diminished to the bilateral bases with forced expiratory wheezing noted throughout to auscultation bilaterally, respirations were unlabored at rest on CPAP during evaluation Abd: soft, nontender, no hepatomegaly  Ext: 1+ pitting edema to bilateral lower extremities Musculoskeletal:  No deformities, BUE and BLE strength normal and equal Skin: warm and dry, discolored station ulceration noticed to bilateral shins Neuro:  CNs 2-12 intact, no focal abnormalities noted Psych:  Normal affect   EKG:  The EKG was personally reviewed and demonstrates: Sinus tachycardia with a rightward axis with a rate of 121 Telemetry:  Telemetry was personally reviewed and demonstrates: Sinus tachycardia rates in the 1 teens to 120s  Relevant CV Studies: TTE 11/10/2021  1. Left ventricular ejection fraction, by estimation, is 65 to 70%. The  left ventricle has normal function. The left ventricle has no  regional  wall motion abnormalities. Left ventricular diastolic parameters are  consistent with Grade I diastolic  dysfunction (impaired relaxation).   2. Right ventricular systolic function is normal. The right ventricular  size is normal.   3. The mitral valve is normal in structure. Mild mitral valve  regurgitation.   4. The aortic valve is normal in structure. Aortic valve regurgitation is  trivial.   Laboratory Data:  High Sensitivity Troponin:   Recent Labs  Lab 11/09/21 1230 11/09/21 1550 11/10/21 0024 11/10/21 0325 11/10/21 0548  TROPONINIHS 12 17 20* 126* 229*     Chemistry Recent Labs  Lab 11/09/21 1230 11/10/21 0025  NA 134* 133*  K 3.9 4.8  CL 97* 99  CO2 25 24  GLUCOSE 126* 177*  BUN 17 20  CREATININE 1.28* 1.36*  CALCIUM 9.1 9.2  GFRNONAA >60 >60  ANIONGAP 12 10    Recent Labs  Lab 11/09/21 1230  PROT 8.0  ALBUMIN 3.5  AST 20  ALT 18  ALKPHOS 69  BILITOT 0.8   Lipids No results for input(s): "CHOL", "TRIG", "HDL", "LABVLDL", "LDLCALC", "CHOLHDL" in the last 168 hours.  Hematology Recent Labs  Lab 11/09/21 1230 11/10/21 0325  WBC 7.9 8.5  RBC 4.86 4.92  HGB 12.8* 13.0  HCT 39.1 40.0  MCV 80.5 81.3  MCH 26.3 26.4  MCHC 32.7 32.5  RDW 15.0 15.1  PLT 309 304   Thyroid No results for input(s): "TSH", "FREET4" in the last 168 hours.  BNP Recent Labs  Lab 11/09/21 1230  BNP 44.9    DDimer No results for input(s): "DDIMER" in the last 168 hours.   Radiology/Studies:  ECHOCARDIOGRAM COMPLETE  Result  Date: 11/10/2021    ECHOCARDIOGRAM REPORT   Patient Name:   [name]. Date of Exam: 11/10/2021 Medical Rec #:  [id number]            Height:       68.0 in Accession #:    [id number]           Weight:       282.2 lb Date of Birth:  1965-05-05            BSA:          2.366 m Patient Age:    71 years             BP:           138/92 mmHg Patient Gender: M                    HR:           102 bpm. Exam Location:  [facility] Procedure: 2D  Echo, Color Doppler, Cardiac Doppler and Intracardiac            Opacification Agent Indications:     R07.9 Chest Pain  History:         Patient has prior history of Echocardiogram examinations, most                  recent 03/13/2019. CAD, COPD; Risk Factors:Hypertension,                  Diabetes, Dyslipidemia and Sleep Apnea.  Sonographer:     Charmayne Sheer Referring Phys:  [id number] Jose Persia Diagnosing Phys: Yolonda Kida MD  Sonographer Comments: Suboptimal parasternal window. Image acquisition challenging due to patient body habitus. IMPRESSIONS  1. Left ventricular ejection fraction, by estimation, is 65 to 70%. The left ventricle has normal function. The left ventricle has no regional wall motion abnormalities. Left ventricular diastolic parameters are consistent with Grade I diastolic dysfunction (impaired relaxation).  2. Right ventricular systolic function is normal. The right ventricular size is normal.  3. The mitral valve is normal in structure. Mild mitral valve regurgitation.  4. The aortic valve is normal in structure. Aortic valve regurgitation is trivial. FINDINGS  Left Ventricle: Left ventricular ejection fraction, by estimation, is 65 to 70%. The left ventricle has normal function. The left ventricle has no regional wall motion abnormalities. Definity contrast agent was given IV to delineate the left ventricular  endocardial borders. The left ventricular internal cavity size was normal in size. There is no left ventricular hypertrophy. Left ventricular diastolic parameters are consistent with Grade I diastolic dysfunction (impaired relaxation). Right Ventricle: The right ventricular size is normal. No increase in right ventricular wall thickness. Right ventricular systolic function is normal. Left Atrium: Left atrial size was normal in size. Right Atrium: Right atrial size was normal in size. Pericardium: There is no evidence of pericardial effusion. Mitral Valve: The mitral valve is normal  in structure. Mild mitral valve regurgitation. Tricuspid Valve: The tricuspid valve is normal in structure. Tricuspid valve regurgitation is mild. Aortic Valve: The aortic valve is normal in structure. Aortic valve regurgitation is trivial. Aortic valve mean gradient measures 5.0 mmHg. Aortic valve peak gradient measures 9.4 mmHg. Aortic valve area, by VTI measures 2.42 cm. Pulmonic Valve: The pulmonic valve was normal in structure. Pulmonic valve regurgitation is not visualized. Aorta: The ascending aorta was not well visualized. IAS/Shunts: No atrial level shunt detected by color flow Doppler.  LEFT VENTRICLE PLAX  2D LVIDd:         4.70 cm   Diastology LVIDs:         2.90 cm   LV e' medial:    10.00 cm/s LV PW:         1.00 cm   LV E/e' medial:  7.4 LV IVS:        0.90 cm   LV e' lateral:   10.90 cm/s LVOT diam:     1.90 cm   LV E/e' lateral: 6.8 LV SV:         58 LV SV Index:   24 LVOT Area:     2.84 cm  RIGHT VENTRICLE RV Basal diam:  3.40 cm TAPSE (M-mode): 3.0 cm LEFT ATRIUM           Index        RIGHT ATRIUM           Index LA diam:      3.80 cm 1.61 cm/m   RA Area:     14.80 cm LA Vol (A4C): 25.5 ml 10.78 ml/m  RA Volume:   36.60 ml  15.47 ml/m  AORTIC VALVE                     PULMONIC VALVE AV Area (Vmax):    2.52 cm      PV Vmax:       0.87 m/s AV Area (Vmean):   2.38 cm      PV Vmean:      61.400 cm/s AV Area (VTI):     2.42 cm      PV VTI:        0.125 m AV Vmax:           153.00 cm/s   PV Peak grad:  3.0 mmHg AV Vmean:          107.000 cm/s  PV Mean grad:  2.0 mmHg AV VTI:            0.238 m AV Peak Grad:      9.4 mmHg AV Mean Grad:      5.0 mmHg LVOT Vmax:         136.00 cm/s LVOT Vmean:        89.900 cm/s LVOT VTI:          0.203 m LVOT/AV VTI ratio: 0.85  AORTA Ao Root diam: 2.50 cm MITRAL VALVE MV Area (PHT): 4.41 cm    SHUNTS MV Decel Time: 172 msec    Systemic VTI:  0.20 m MV E velocity: 74.50 cm/s  Systemic Diam: 1.90 cm MV A velocity: 80.80 cm/s MV E/A ratio:  0.92 Dwayne D [place]wood  MD Electronically signed by Yolonda Kida MD Signature Date/Time: 11/10/2021/1:17:28 PM    Final    CT Angio Chest PE W and/or Wo Contrast  Result Date: 11/09/2021 CLINICAL DATA:  Worsening shortness of breath. Clinical concern for pulmonary embolus. EXAM: CT ANGIOGRAPHY CHEST WITH CONTRAST TECHNIQUE: Multidetector CT imaging of the chest was performed using the standard protocol during bolus administration of intravenous contrast. Multiplanar CT image reconstructions and MIPs were obtained to evaluate the vascular anatomy. RADIATION DOSE REDUCTION: This exam was performed according to the departmental dose-optimization program which includes automated exposure control, adjustment of the mA and/or kV according to patient size and/or use of iterative reconstruction technique. CONTRAST:  8m OMNIPAQUE IOHEXOL 350 MG/ML SOLN COMPARISON:  08/25/2021 FINDINGS: Cardiovascular: The heart size is normal. No substantial pericardial  effusion. Mild atherosclerotic calcification is noted in the wall of the thoracic aorta. Enlargement of the pulmonary outflow tract/main pulmonary arteries suggests pulmonary arterial hypertension. There is no filling defect within the opacified pulmonary arteries to suggest the presence of an acute pulmonary embolus. Mediastinum/Nodes: Upper normal lymph nodes in the mediastinum and both hilar regions are similar to prior including 12 mm short axis subcarinal node on 82/4. The esophagus has normal imaging features. There is no axillary lymphadenopathy. Lungs/Pleura: There is diffuse tree-in-bud nodularity in the right upper lobe, right middle lobe, and right lower lobe. Similar but perhaps slightly less advanced changes are noted in the left upper and lower lobes. No dense focal airspace consolidation no pulmonary edema or pleural effusion. Upper Abdomen: Calcified gallstones evident. 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 No followup imaging is recommended. Musculoskeletal:  No worrisome lytic or sclerotic osseous abnormality. Review of the MIP images confirms the above findings. IMPRESSION: 1. No CT evidence for acute pulmonary embolus. 2. Enlargement of the pulmonary outflow tract/main pulmonary arteries suggests pulmonary arterial hypertension. 3. Interval development of diffuse tree-in-bud nodularity involving all lobes of both lungs. While nonspecific, this can be seen in the setting of inflammatory or infectious bronchiolitis (including MAI). Follow-up CT chest without contrast in 3 months recommended to ensure resolution. 4. Borderline mediastinal and hilar lymphadenopathy, likely reactive. 5. Cholelithiasis. 6. 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 No followup imaging is recommended. 7.  Aortic Atherosclerosis (ICD10-I70.0). Electronically Signed   By: Misty Stanley M.D.   On: 11/09/2021 14:36   DG Chest 2 View  Result Date: 11/09/2021 CLINICAL DATA:  Shortness of breath EXAM: CHEST - 2 VIEW COMPARISON:  08/25/2021 FINDINGS: The heart size and mediastinal contours are within normal limits. Pulmonary vascular prominence and mild diffuse interstitial opacity. Disc degenerative disease of the thoracic spine. IMPRESSION: Pulmonary vascular prominence and mild diffuse interstitial opacity, consistent with edema. No focal airspace opacity. Electronically Signed   By: Delanna Ahmadi M.D.   On: 11/09/2021 13:15     Assessment and Plan:   Elevated high-sensitivity troponins -Chest pain-free on examination -High-sensitivity troponins trended at 12, 17, 20, 126, 229 -Continued on IV heparin -Continue on cardiac monitoring -Respiratory status allows will undergo left heart catheterization in a.m. -Continue statin therapy -EKG as needed for pain or changes -Last stress test was completed in 2021 which had normal results -Echocardiogram with with LVEF 65-70 with no regional wall motion abnormalities  COPD with acute exacerbation -Longstanding history of COPD and  continued active smoking -Continued on BiPAP -Wean as tolerated to maintain oxygen sats greater than equal to 92% -Continue DuoNebs -Continued on prednisone -Severe exacerbation of COPD treated with ceftriaxone and azithromycin for CAP coverage with continued de-escalation of antibiotic therapy -COVID/flu a and B negative -CTA of the chest negative for PE -Sputum cultures pending -Management per IM  Essential hypertension -Blood pressure 123/78 -Currently stable, continued on current medication regimen -Vital signs per unit protocol  Type 2 diabetes -Moderate sliding scale coverage -A1c of 5.6 08/2021 -Management per IM  Tobacco abuse -Recommend cessation  Shared Decision Making/Informed Consent The risks [stroke (1 in 1000), death (1 in 1000), kidney failure [usually temporary] (1 in 500), bleeding (1 in 200), allergic reaction [possibly serious] (1 in 200)], benefits (diagnostic support and management of coronary artery disease) and alternatives of a cardiac catheterization were discussed in detail with [name] and she is willing to proceed.    Risk Assessment/Risk Scores:  TIMI Risk Score for Unstable Angina or Non-ST Elevation MI:   The patient's TIMI risk score is 3, which indicates a 13% risk of all cause mortality, new or recurrent myocardial infarction or need for urgent revascularization in the next 14 days.          For questions or updates, please contact [place] Please consult [email or web address] for contact info under    Signed, Joleena Weisenburger, NP  11/10/2021 3:39 PM

## 2021-11-10 NOTE — Progress Notes (Signed)
*  PRELIMINARY RESULTS* Echocardiogram 2D Echocardiogram has been performed.  Wallie Char Jem Castro 11/10/2021, 10:29 AM

## 2021-11-10 NOTE — Progress Notes (Signed)
PT Cancellation Note  Patient Details Name: [name]. MRN: [id number] DOB: 08/14/1965   Cancelled Treatment:    Reason Eval/Treat Not Completed: Patient not medically ready PT orders received, chart reviewed. Pt noted to have increasing troponin levels & started on IV heparin. Will hold PT evaluation at this time.  Lavone Nian, PT, DPT 11/10/21, 8:39 AM  Waunita Schooner 11/10/2021, 8:38 AM

## 2021-11-10 NOTE — H&P (View-Only) (Signed)
Cardiology Consultation   Patient ID: [name]. MRN: [id number]; DOB: 05-30-1965  Admit date: 11/09/2021 Date of Consult: 11/10/2021  PCP:  Jonathan Pouch, NP   [place] Providers Cardiologist:  Jonathan Sable, MD        Patient Profile:   [name]. is a 56 y.o. adult with a hx of COPD, type II diabetes, coronary artery disease, hypertension, hyperlipidemia, current smoker, who is being seen 11/10/2021 for the evaluation of shortness of breath at the request of Jonathan Amy, NP.  History of Present Illness:   [name] is a 56 year old adult with the past medical history of COPD, type II diabetes, coronary artery disease, hypertension, hyperlipidemia, and current smoker.  [name] presented to the [facility] emergency department on 11/09/2021 with progressively worsening shortness fo breath with productive cough over the last week. She endorsed associated symptoms of nasal congestion. She denies any fever, chills, chest pain at rest but has had continued chest pain on exertion since 2021 according to outside note review.She described her chest pain as a tightness that has been increasing in frequency and intensity over the last month. She endorses chronic peripheral edema and discoloration. She did note that increasing the usage of her inhalers had helped with some of the shortness of breath. Unfortunately in the emergency department she required the use of CPAP as she continued to desaturate on nasal cannula.  She did have an episode of midsternal chest pain described as somebody sitting on her chest that she reported to be 10 out of 10 overnight.  The chest pain started at rest it was reproducible on palpation.  EKG was unchanged from prior and sublingual nitroglycerin was given with mild improvement.  She was also given 324 mg of aspirin and started on a heparin drip  Initial vitals: blood pressure  126/69, pulse 120, rr 24, temp 98.7  Pertinent labs:  Sodium 134, potassium 3.9, chloride 97, CO2 25, glucose 126, BUN of 17, creatinine of 1.28, with a GFR of greater than 60, WBCs of 7.9, hemoglobin 12.8, hematocrit of 39.1, with platelets of 309, BNP 44.9, respiratory panel was negative, high-sensitivity troponin trended to 12, 17, 20, 126, 229  Imaging: CT a of the chest revealed no evidence of PE however interval development of diffuse tree-in-bud nodularity involving all lobes of both lungs  Medications received in the emergency department: DuoNeb, methylprednisolone 125 mg IV push, aspirin, Nitrostat, heparin per pharmacy   Past Medical History:  Diagnosis Date   Carpal tunnel syndrome    bilateral   COPD (chronic obstructive pulmonary disease) (HCC)    Coronary artery disease    Diabetes mellitus without complication ([place])    Endotracheally intubated 09/30/2020   Hyperlipidemia    Hypertension    Neuropathy    On mechanically assisted ventilation ([place]) 09/30/2020   Peptic ulcer    Sleep apnea     Past Surgical History:  Procedure Laterality Date   COLONOSCOPY WITH PROPOFOL N/A 11/09/2016   Procedure: COLONOSCOPY WITH PROPOFOL;  Surgeon: Lollie Sails, MD;  Location: [facility] ENDOSCOPY;  Service: Endoscopy;  Laterality: N/A;   COLONOSCOPY WITH PROPOFOL N/A 07/06/2017   Procedure: COLONOSCOPY WITH PROPOFOL;  Surgeon: Lollie Sails, MD;  Location: [facility] ENDOSCOPY;  Service: Endoscopy;  Laterality: N/A;   ESOPHAGOGASTRODUODENOSCOPY N/A 05/15/2021   Procedure: ESOPHAGOGASTRODUODENOSCOPY (EGD);  Surgeon: Annamaria Helling, DO;  Location: [facility] ENDOSCOPY;  Service: Gastroenterology;  Laterality: N/A;  DM   ESOPHAGOGASTRODUODENOSCOPY (EGD) WITH  PROPOFOL N/A 11/09/2016   Procedure: ESOPHAGOGASTRODUODENOSCOPY (EGD) WITH PROPOFOL;  Surgeon: Lollie Sails, MD;  Location: [facility] ENDOSCOPY;  Service: Endoscopy;  Laterality: N/A;   ESOPHAGOGASTRODUODENOSCOPY (EGD) WITH PROPOFOL N/A 07/06/2017   Procedure: ESOPHAGOGASTRODUODENOSCOPY  (EGD) WITH PROPOFOL;  Surgeon: Lollie Sails, MD;  Location: [facility] ENDOSCOPY;  Service: Endoscopy;  Laterality: N/A;     Home Medications:  Prior to Admission medications   Medication Sig Start Date End Date Taking? Authorizing Provider  albuterol (VENTOLIN HFA) 108 (90 Base) MCG/ACT inhaler Inhale 2 puffs into the lungs every 4 (four) hours as needed for wheezing or shortness of breath.    Yes [provider]  ARIPiprazole (ABILIFY) 5 MG tablet Take 5 mg by mouth daily. 10/22/21  Yes [provider]  aspirin-acetaminophen-caffeine (EXCEDRIN MIGRAINE) [phone] MG tablet Take by mouth every 6 (six) hours as needed for headache.   Yes [provider]  atorvastatin (LIPITOR) 40 MG tablet Take 40 mg by mouth daily. 08/04/21  Yes [provider]  escitalopram (LEXAPRO) 20 MG tablet Take 20 mg by mouth daily. 10/06/19 11/09/21 Yes [provider]  estradiol (ESTRACE) 2 MG tablet Take 2 mg by mouth in the morning and at bedtime. 03/13/20  Yes [provider]  famotidine (PEPCID) 40 MG tablet Take 40 mg by mouth daily. 06/20/21  Yes [provider]  gabapentin (NEURONTIN) 600 MG tablet Take 600 mg by mouth 3 (three) times daily. 07/23/21  Yes [provider]  hydrochlorothiazide (MICROZIDE) 12.5 MG capsule Take 12.5 mg by mouth daily.   Yes [provider]  hydrocortisone 2.5 % lotion APPLY TO AFFECTEDAREA RASH ON BUTTOCKS 3 DAYS PER WEEK. TUESDAY, THURSDAY, SATURDAY, AND AS NEEDED FOR FLARES 07/16/20  Yes Ralene Bathe, MD  ipratropium-albuterol (DUONEB) 0.5-2.5 (3) MG/3ML SOLN Inhale 3 mLs into the lungs 4 (four) times daily as needed for wheezing. 08/29/21 08/24/22 Yes [provider]  loratadine (CLARITIN) 10 MG tablet Take 10 mg by mouth daily.   Yes [provider]  metFORMIN (GLUCOPHAGE) 1000 MG tablet Take 1,000 mg by mouth 2 (two) times daily with a meal.   Yes [provider]  oxybutynin  (DITROPAN) 5 MG tablet Take 5 mg by mouth every 12 (twelve) hours.   Yes [provider]  pantoprazole (PROTONIX) 40 MG tablet Take 1 tablet (40 mg total) by mouth daily. 01/19/21 01/19/22 Yes Cuthriell, Charline Bills, PA-C  simethicone (MYLICON) 361 MG chewable tablet Chew 125 mg by mouth every 6 (six) hours as needed for flatulence.   Yes [provider]  spironolactone (ALDACTONE) 50 MG tablet Take 50 mg by mouth 2 (two) times daily. 03/13/20  Yes [provider]  tiZANidine (ZANAFLEX) 4 MG tablet Take 1 tablet (4 mg total) by mouth every 8 (eight) hours as needed for muscle spasms. 11/15/19 11/09/21 Yes Milinda Pointer, MD  TRELEGY ELLIPTA 100-62.5-25 MCG/INH AEPB Inhale 1 puff into the lungs daily. 09/20/20  Yes [provider]  buPROPion (WELLBUTRIN XL) 300 MG 24 hr tablet Take 1 tablet (300 mg total) by mouth daily. 01/02/21 02/01/21  Norman Clay, MD  meloxicam (MOBIC) 15 MG tablet Take 1 tablet (15 mg total) by mouth daily. 11/15/19 11/09/21  Milinda Pointer, MD    Inpatient Medications: Scheduled Meds:  ARIPiprazole  5 mg Oral Daily   atorvastatin  40 mg Oral QPM   azithromycin  500 mg Oral Daily   escitalopram  20 mg Oral Daily   estradiol  2 mg Oral  BID   famotidine  40 mg Oral Daily   [START ON 11/11/2021] fluticasone furoate-vilanterol  1 puff Inhalation Daily   And   [START ON 11/11/2021] umeclidinium bromide  1 puff Inhalation Daily   gabapentin  600 mg Oral TID   hydrochlorothiazide  12.5 mg Oral Daily   insulin aspart  0-15 Units Subcutaneous TID WC   ipratropium-albuterol  3 mL Nebulization Q6H   lidocaine  1 patch Transdermal Q24H    morphine injection  2 mg Intravenous Once   multivitamin with minerals  1 tablet Oral Daily   nicotine  7 mg Transdermal Daily   oxybutynin  5 mg Oral BID   predniSONE  40 mg Oral Q breakfast   sodium chloride flush  3 mL Intravenous Q12H   sodium chloride flush  3 mL Intravenous Q12H   spironolactone  75  mg Oral BID   Continuous Infusions:  cefTRIAXone (ROCEPHIN)  IV     heparin 1,300 Units/hr (11/10/21 0749)   PRN Meds: acetaminophen **OR** acetaminophen, nitroGLYCERIN, ondansetron **OR** ondansetron (ZOFRAN) IV, polyethylene glycol  Allergies:    Allergies  Allergen Reactions   Shellfish Allergy Swelling   Codeine Rash   Lisinopril Cough    Social History:   Social History   Socioeconomic History   Marital status: Single    Spouse name: Not on file   Number of children: Not on file   Years of education: Not on file   Highest education level: Not on file  Occupational History   Not on file  Tobacco Use   Smoking status: Every Day    Packs/day: 0.75    Years: 30.00    Total pack years: 22.50    Types: Cigarettes    Last attempt to quit: 08/13/2019    Years since quitting: 2.2   Smokeless tobacco: Never   Tobacco comments:    trying decrease  Vaping Use   Vaping Use: Never used  Substance and Sexual Activity   Alcohol use: No   Drug use: No   Sexual activity: Not Currently  Other Topics Concern   Not on file  Social History Narrative   Not on file   Social Determinants of Health   Financial Resource Strain: Not on file  Food Insecurity: Not on file  Transportation Needs: Not on file  Physical Activity: Not on file  Stress: Not on file  Social Connections: Not on file  Intimate Partner Violence: Not on file    Family History:    Family History  Problem Relation Age of Onset   Diabetes Mother    Diabetes Father    Stomach cancer Father    Heart attack Father    Alcohol abuse Father    Heart attack Sister    Heart attack Brother    Heart attack Paternal Aunt    Heart attack Paternal Uncle      ROS:  Please see the history of present illness.  Review of Systems  Constitutional:  Positive for malaise/fatigue.  Respiratory:  Positive for cough, shortness of breath and wheezing.   Cardiovascular:  Positive for chest pain and leg swelling.   Neurological:  Positive for weakness.    All other ROS reviewed and negative.     Physical Exam/Data:   Vitals:   11/10/21 1030 11/10/21 1130 11/10/21 1200 11/10/21 1400  BP: 123/78   (!) 142/82  Pulse: (!) 112 (!) 103 (!) 104 (!) 108  Resp: (!) 25 (!) 23 (!) 23 20  Temp:    98.2 F (36.8 C)  TempSrc:    Oral  SpO2: 95% 96% 94% 93%  Weight:      Height:        Intake/Output Summary (Last 24 hours) at 11/10/2021 1539 Last data filed at 11/09/2021 1543 Gross per 24 hour  Intake 100 ml  Output --  Net 100 ml      11/09/2021    5:10 PM 08/25/2021   11:38 AM 05/15/2021   11:17 AM  Last 3 Weights  Weight (lbs) 282 lb 3 oz 294 lb 298 lb  Weight (kg) 128 kg 133.358 kg 135.172 kg     Body mass index is 42.92 kg/m.  General:  Well nourished, well developed, in no acute distress HEENT: normal Neck: no JVD pressured, difficult to assess due to CPAP and body habitus Vascular: No carotid bruits; Distal pulses 2+ bilaterally Cardiac:  normal S1, S2; RRR; no murmur  Lungs:  clear to the upper lobes diminished to the bilateral bases with forced expiratory wheezing noted throughout to auscultation bilaterally, respirations were unlabored at rest on CPAP during evaluation Abd: soft, nontender, no hepatomegaly  Ext: 1+ pitting edema to bilateral lower extremities Musculoskeletal:  No deformities, BUE and BLE strength normal and equal Skin: warm and dry, discolored station ulceration noticed to bilateral shins Neuro:  CNs 2-12 intact, no focal abnormalities noted Psych:  Normal affect   EKG:  The EKG was personally reviewed and demonstrates: Sinus tachycardia with a rightward axis with a rate of 121 Telemetry:  Telemetry was personally reviewed and demonstrates: Sinus tachycardia rates in the 1 teens to 120s  Relevant CV Studies: TTE 11/10/2021  1. Left ventricular ejection fraction, by estimation, is 65 to 70%. The  left ventricle has normal function. The left ventricle has no  regional  wall motion abnormalities. Left ventricular diastolic parameters are  consistent with Grade I diastolic  dysfunction (impaired relaxation).   2. Right ventricular systolic function is normal. The right ventricular  size is normal.   3. The mitral valve is normal in structure. Mild mitral valve  regurgitation.   4. The aortic valve is normal in structure. Aortic valve regurgitation is  trivial.   Laboratory Data:  High Sensitivity Troponin:   Recent Labs  Lab 11/09/21 1230 11/09/21 1550 11/10/21 0024 11/10/21 0325 11/10/21 0548  TROPONINIHS 12 17 20* 126* 229*     Chemistry Recent Labs  Lab 11/09/21 1230 11/10/21 0025  NA 134* 133*  K 3.9 4.8  CL 97* 99  CO2 25 24  GLUCOSE 126* 177*  BUN 17 20  CREATININE 1.28* 1.36*  CALCIUM 9.1 9.2  GFRNONAA >60 >60  ANIONGAP 12 10    Recent Labs  Lab 11/09/21 1230  PROT 8.0  ALBUMIN 3.5  AST 20  ALT 18  ALKPHOS 69  BILITOT 0.8   Lipids No results for input(s): "CHOL", "TRIG", "HDL", "LABVLDL", "LDLCALC", "CHOLHDL" in the last 168 hours.  Hematology Recent Labs  Lab 11/09/21 1230 11/10/21 0325  WBC 7.9 8.5  RBC 4.86 4.92  HGB 12.8* 13.0  HCT 39.1 40.0  MCV 80.5 81.3  MCH 26.3 26.4  MCHC 32.7 32.5  RDW 15.0 15.1  PLT 309 304   Thyroid No results for input(s): "TSH", "FREET4" in the last 168 hours.  BNP Recent Labs  Lab 11/09/21 1230  BNP 44.9    DDimer No results for input(s): "DDIMER" in the last 168 hours.   Radiology/Studies:  ECHOCARDIOGRAM COMPLETE  Result  Date: 11/10/2021    ECHOCARDIOGRAM REPORT   Patient Name:   [name]. Date of Exam: 11/10/2021 Medical Rec #:  [id number]            Height:       68.0 in Accession #:    [id number]           Weight:       282.2 lb Date of Birth:  08/27/1965            BSA:          2.366 m Patient Age:    56 years             BP:           138/92 mmHg Patient Gender: M                    HR:           102 bpm. Exam Location:  [facility] Procedure: 2D  Echo, Color Doppler, Cardiac Doppler and Intracardiac            Opacification Agent Indications:     R07.9 Chest Pain  History:         Patient has prior history of Echocardiogram examinations, most                  recent 03/13/2019. CAD, COPD; Risk Factors:Hypertension,                  Diabetes, Dyslipidemia and Sleep Apnea.  Sonographer:     Charmayne Sheer Referring Phys:  [id number] Jose Persia Diagnosing Phys: Yolonda Kida MD  Sonographer Comments: Suboptimal parasternal window. Image acquisition challenging due to patient body habitus. IMPRESSIONS  1. Left ventricular ejection fraction, by estimation, is 65 to 70%. The left ventricle has normal function. The left ventricle has no regional wall motion abnormalities. Left ventricular diastolic parameters are consistent with Grade I diastolic dysfunction (impaired relaxation).  2. Right ventricular systolic function is normal. The right ventricular size is normal.  3. The mitral valve is normal in structure. Mild mitral valve regurgitation.  4. The aortic valve is normal in structure. Aortic valve regurgitation is trivial. FINDINGS  Left Ventricle: Left ventricular ejection fraction, by estimation, is 65 to 70%. The left ventricle has normal function. The left ventricle has no regional wall motion abnormalities. Definity contrast agent was given IV to delineate the left ventricular  endocardial borders. The left ventricular internal cavity size was normal in size. There is no left ventricular hypertrophy. Left ventricular diastolic parameters are consistent with Grade I diastolic dysfunction (impaired relaxation). Right Ventricle: The right ventricular size is normal. No increase in right ventricular wall thickness. Right ventricular systolic function is normal. Left Atrium: Left atrial size was normal in size. Right Atrium: Right atrial size was normal in size. Pericardium: There is no evidence of pericardial effusion. Mitral Valve: The mitral valve is normal  in structure. Mild mitral valve regurgitation. Tricuspid Valve: The tricuspid valve is normal in structure. Tricuspid valve regurgitation is mild. Aortic Valve: The aortic valve is normal in structure. Aortic valve regurgitation is trivial. Aortic valve mean gradient measures 5.0 mmHg. Aortic valve peak gradient measures 9.4 mmHg. Aortic valve area, by VTI measures 2.42 cm. Pulmonic Valve: The pulmonic valve was normal in structure. Pulmonic valve regurgitation is not visualized. Aorta: The ascending aorta was not well visualized. IAS/Shunts: No atrial level shunt detected by color flow Doppler.  LEFT VENTRICLE PLAX  2D LVIDd:         4.70 cm   Diastology LVIDs:         2.90 cm   LV e' medial:    10.00 cm/s LV PW:         1.00 cm   LV E/e' medial:  7.4 LV IVS:        0.90 cm   LV e' lateral:   10.90 cm/s LVOT diam:     1.90 cm   LV E/e' lateral: 6.8 LV SV:         58 LV SV Index:   24 LVOT Area:     2.84 cm  RIGHT VENTRICLE RV Basal diam:  3.40 cm TAPSE (M-mode): 3.0 cm LEFT ATRIUM           Index        RIGHT ATRIUM           Index LA diam:      3.80 cm 1.61 cm/m   RA Area:     14.80 cm LA Vol (A4C): 25.5 ml 10.78 ml/m  RA Volume:   36.60 ml  15.47 ml/m  AORTIC VALVE                     PULMONIC VALVE AV Area (Vmax):    2.52 cm      PV Vmax:       0.87 m/s AV Area (Vmean):   2.38 cm      PV Vmean:      61.400 cm/s AV Area (VTI):     2.42 cm      PV VTI:        0.125 m AV Vmax:           153.00 cm/s   PV Peak grad:  3.0 mmHg AV Vmean:          107.000 cm/s  PV Mean grad:  2.0 mmHg AV VTI:            0.238 m AV Peak Grad:      9.4 mmHg AV Mean Grad:      5.0 mmHg LVOT Vmax:         136.00 cm/s LVOT Vmean:        89.900 cm/s LVOT VTI:          0.203 m LVOT/AV VTI ratio: 0.85  AORTA Ao Root diam: 2.50 cm MITRAL VALVE MV Area (PHT): 4.41 cm    SHUNTS MV Decel Time: 172 msec    Systemic VTI:  0.20 m MV E velocity: 74.50 cm/s  Systemic Diam: 1.90 cm MV A velocity: 80.80 cm/s MV E/A ratio:  0.92 Dwayne D Callwood  MD Electronically signed by Yolonda Kida MD Signature Date/Time: 11/10/2021/1:17:28 PM    Final    CT Angio Chest PE W and/or Wo Contrast  Result Date: 11/09/2021 CLINICAL DATA:  Worsening shortness of breath. Clinical concern for pulmonary embolus. EXAM: CT ANGIOGRAPHY CHEST WITH CONTRAST TECHNIQUE: Multidetector CT imaging of the chest was performed using the standard protocol during bolus administration of intravenous contrast. Multiplanar CT image reconstructions and MIPs were obtained to evaluate the vascular anatomy. RADIATION DOSE REDUCTION: This exam was performed according to the departmental dose-optimization program which includes automated exposure control, adjustment of the mA and/or kV according to patient size and/or use of iterative reconstruction technique. CONTRAST:  43m OMNIPAQUE IOHEXOL 350 MG/ML SOLN COMPARISON:  08/25/2021 FINDINGS: Cardiovascular: The heart size is normal. No substantial pericardial  effusion. Mild atherosclerotic calcification is noted in the wall of the thoracic aorta. Enlargement of the pulmonary outflow tract/main pulmonary arteries suggests pulmonary arterial hypertension. There is no filling defect within the opacified pulmonary arteries to suggest the presence of an acute pulmonary embolus. Mediastinum/Nodes: Upper normal lymph nodes in the mediastinum and both hilar regions are similar to prior including 12 mm short axis subcarinal node on 82/4. The esophagus has normal imaging features. There is no axillary lymphadenopathy. Lungs/Pleura: There is diffuse tree-in-bud nodularity in the right upper lobe, right middle lobe, and right lower lobe. Similar but perhaps slightly less advanced changes are noted in the left upper and lower lobes. No dense focal airspace consolidation no pulmonary edema or pleural effusion. Upper Abdomen: Calcified gallstones evident. 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 No followup imaging is recommended. Musculoskeletal:  No worrisome lytic or sclerotic osseous abnormality. Review of the MIP images confirms the above findings. IMPRESSION: 1. No CT evidence for acute pulmonary embolus. 2. Enlargement of the pulmonary outflow tract/main pulmonary arteries suggests pulmonary arterial hypertension. 3. Interval development of diffuse tree-in-bud nodularity involving all lobes of both lungs. While nonspecific, this can be seen in the setting of inflammatory or infectious bronchiolitis (including MAI). Follow-up CT chest without contrast in 3 months recommended to ensure resolution. 4. Borderline mediastinal and hilar lymphadenopathy, likely reactive. 5. Cholelithiasis. 6. 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 No followup imaging is recommended. 7.  Aortic Atherosclerosis (ICD10-I70.0). Electronically Signed   By: Misty Stanley M.D.   On: 11/09/2021 14:36   DG Chest 2 View  Result Date: 11/09/2021 CLINICAL DATA:  Shortness of breath EXAM: CHEST - 2 VIEW COMPARISON:  08/25/2021 FINDINGS: The heart size and mediastinal contours are within normal limits. Pulmonary vascular prominence and mild diffuse interstitial opacity. Disc degenerative disease of the thoracic spine. IMPRESSION: Pulmonary vascular prominence and mild diffuse interstitial opacity, consistent with edema. No focal airspace opacity. Electronically Signed   By: Delanna Ahmadi M.D.   On: 11/09/2021 13:15     Assessment and Plan:   Elevated high-sensitivity troponins -Chest pain-free on examination -High-sensitivity troponins trended at 12, 17, 20, 126, 229 -Continued on IV heparin -Continue on cardiac monitoring -Respiratory status allows will undergo left heart catheterization in a.m. -Continue statin therapy -EKG as needed for pain or changes -Last stress test was completed in 2021 which had normal results -Echocardiogram with with LVEF 65-70 with no regional wall motion abnormalities  COPD with acute exacerbation -Longstanding history of COPD and  continued active smoking -Continued on BiPAP -Wean as tolerated to maintain oxygen sats greater than equal to 92% -Continue DuoNebs -Continued on prednisone -Severe exacerbation of COPD treated with ceftriaxone and azithromycin for CAP coverage with continued de-escalation of antibiotic therapy -COVID/flu a and B negative -CTA of the chest negative for PE -Sputum cultures pending -Management per IM  Essential hypertension -Blood pressure 123/78 -Currently stable, continued on current medication regimen -Vital signs per unit protocol  Type 2 diabetes -Moderate sliding scale coverage -A1c of 5.6 08/2021 -Management per IM  Tobacco abuse -Recommend cessation  Shared Decision Making/Informed Consent The risks [stroke (1 in 1000), death (1 in 1000), kidney failure [usually temporary] (1 in 500), bleeding (1 in 200), allergic reaction [possibly serious] (1 in 200)], benefits (diagnostic support and management of coronary artery disease) and alternatives of a cardiac catheterization were discussed in detail with Jonathan. Balles and she is willing to proceed.    Risk Assessment/Risk Scores:  TIMI Risk Score for Unstable Angina or Non-ST Elevation MI:   The patient's TIMI risk score is 3, which indicates a 13% risk of all cause mortality, new or recurrent myocardial infarction or need for urgent revascularization in the next 14 days.          For questions or updates, please contact [place] Please consult [email or web address] for contact info under    Signed, Ines Rebel, NP  11/10/2021 3:39 PM

## 2021-11-10 NOTE — Progress Notes (Signed)
ANTICOAGULATION CONSULT NOTE   Pharmacy Consult for IV heparin Indication: chest pain/ACS  Allergies  Allergen Reactions   Shellfish Allergy Swelling   Codeine Rash   Lisinopril Cough    Patient Measurements: Height: 5' 7.99" (172.7 cm) Weight: 128 kg (282 lb 3 oz) IBW/kg (Calculated) : 68.38 Heparin Dosing Weight: 98.2 kg  Vital Signs: Temp: 98.6 F (37 C) (10/30 0500) BP: 138/92 (10/30 0510) Pulse Rate: 88 (10/30 0510)  Labs: Recent Labs    11/09/21 1230 11/09/21 1550 11/10/21 0024 11/10/21 0025 11/10/21 0325  HGB 12.8*  --   --   --  13.0  HCT 39.1  --   --   --  40.0  PLT 309  --   --   --  304  CREATININE 1.28*  --   --  1.36*  --   TROPONINIHS 12 17 20*  --  126*    Estimated Creatinine Clearance (by C-G formula based on SCr of 1.36 mg/dL (H)) Male: 65.3 mL/min (A) Male: 79.1 mL/min (A)   Medical History: Past Medical History:  Diagnosis Date   Carpal tunnel syndrome    bilateral   COPD (chronic obstructive pulmonary disease) (HCC)    Coronary artery disease    Diabetes mellitus without complication (HCC)    Endotracheally intubated 09/30/2020   Hyperlipidemia    Hypertension    Neuropathy    On mechanically assisted ventilation ([place]) 09/30/2020   Peptic ulcer    Sleep apnea     Medications:  Not on PTA anticoagulation per my chart review  Assessment: 56 year old male with history of CAD, T2DM, HLD admitted with shortness of breath. Troponins elevated.  Baseline H&H stable. Baseline aPTT and PT-INR in process.  Goal of Therapy:  Heparin level 0.3-0.7 units/ml Monitor platelets by anticoagulation protocol: Yes   Plan:  Give 4,000 units bolus x 1 Start heparin infusion at 1300 units/hr Check anti-Xa level in 6 hours and daily while on heparin Continue to monitor H&H and platelets   Wynelle Cleveland 11/10/2021,6:26 AM

## 2021-11-10 NOTE — Progress Notes (Signed)
ANTICOAGULATION CONSULT NOTE   Pharmacy Consult for IV heparin Indication: chest pain/ACS  Allergies  Allergen Reactions   Shellfish Allergy Swelling   Codeine Rash   Lisinopril Cough    Patient Measurements: Height: 5' 7.99" (172.7 cm) Weight: 128 kg (282 lb 3 oz) IBW/kg (Calculated) : 68.38 Heparin Dosing Weight: 98.2 kg  Vital Signs: Temp: 98.2 F (36.8 C) (10/30 1400) Temp Source: Oral (10/30 1400) BP: 142/82 (10/30 1400) Pulse Rate: 108 (10/30 1400)  Labs: Recent Labs    11/09/21 1230 11/09/21 1550 11/10/21 0024 11/10/21 0025 11/10/21 0325 11/10/21 0548 11/10/21 0633  HGB 12.8*  --   --   --  13.0  --   --   HCT 39.1  --   --   --  40.0  --   --   PLT 309  --   --   --  304  --   --   APTT  --   --   --   --   --   --  32  LABPROT  --   --   --   --   --   --  14.4  INR  --   --   --   --   --   --  1.1  CREATININE 1.28*  --   --  1.36*  --   --   --   TROPONINIHS 12   < > 20*  --  126* 229*  --    < > = values in this interval not displayed.     Estimated Creatinine Clearance (by C-G formula based on SCr of 1.36 mg/dL (H)) Male: 65.3 mL/min (A) Male: 79.1 mL/min (A)   Medical History: Past Medical History:  Diagnosis Date   Carpal tunnel syndrome    bilateral   COPD (chronic obstructive pulmonary disease) (HCC)    Coronary artery disease    Diabetes mellitus without complication (HCC)    Endotracheally intubated 09/30/2020   Hyperlipidemia    Hypertension    Neuropathy    On mechanically assisted ventilation ([place]) 09/30/2020   Peptic ulcer    Sleep apnea     Medications:  Not on PTA anticoagulation per my chart review  Assessment: 56 year old male with history of CAD, T2DM, HLD admitted with shortness of breath. Troponins elevated.  Baseline H&H stable. Baseline aPTT 32s INR 1.1  Goal of Therapy:  Heparin level 0.3-0.7 units/ml Monitor platelets by anticoagulation protocol: Yes   Plan: heparin level is therapeutic --continue  heparin infusion at 1300 units/hr --recheck anti-Xa level in 6 hours and daily while on heparin --Continue to monitor H&H and platelets   Dallie Piles 11/10/2021,4:06 PM

## 2021-11-10 NOTE — Progress Notes (Signed)
ANTICOAGULATION CONSULT NOTE   Pharmacy Consult for IV heparin Indication: chest pain/ACS  Allergies  Allergen Reactions   Shellfish Allergy Swelling   Codeine Rash   Lisinopril Cough    Patient Measurements: Height: 5' 7.99" (172.7 cm) Weight: 128 kg (282 lb 3 oz) IBW/kg (Calculated) : 68.38 Heparin Dosing Weight: 98.2 kg  Vital Signs: Temp: 98.4 F (36.9 C) (10/30 1953) Temp Source: Oral (10/30 1953) BP: 133/74 (10/30 1953) Pulse Rate: 65 (10/30 1953)  Labs: Recent Labs    11/09/21 1230 11/09/21 1550 11/10/21 0024 11/10/21 0025 11/10/21 0325 11/10/21 0548 11/10/21 4081 11/10/21 1638 11/10/21 2240  HGB 12.8*  --   --   --  13.0  --   --   --   --   HCT 39.1  --   --   --  40.0  --   --   --   --   PLT 309  --   --   --  304  --   --   --   --   APTT  --   --   --   --   --   --  32  --   --   LABPROT  --   --   --   --   --   --  14.4  --   --   INR  --   --   --   --   --   --  1.1  --   --   HEPARINUNFRC  --   --   --   --   --   --   --  0.36 0.20*  CREATININE 1.28*  --   --  1.36*  --   --   --   --   --   TROPONINIHS 12   < > 20*  --  126* 229*  --   --   --    < > = values in this interval not displayed.     Estimated Creatinine Clearance (by C-G formula based on SCr of 1.36 mg/dL (H)) Male: 65.3 mL/min (A) Male: 79.1 mL/min (A)   Medical History: Past Medical History:  Diagnosis Date   Carpal tunnel syndrome    bilateral   COPD (chronic obstructive pulmonary disease) (HCC)    Coronary artery disease    Diabetes mellitus without complication (HCC)    Endotracheally intubated 09/30/2020   Hyperlipidemia    Hypertension    Neuropathy    On mechanically assisted ventilation ([place]) 09/30/2020   Peptic ulcer    Sleep apnea     Medications:  Not on PTA anticoagulation per my chart review  Assessment: 56 year old male with history of CAD, T2DM, HLD admitted with shortness of breath. Troponins elevated.  Baseline H&H stable. Baseline aPTT  32s INR 1.1  Goal of Therapy:  Heparin level 0.3-0.7 units/ml Monitor platelets by anticoagulation protocol: Yes   Plan: heparin level is subtherapeutic --Bolus 1500 units x 1 --Increase heparin infusion to 1500 units/hr --Recheck HL in 6 hrs after rate change --Continue to monitor H&H and platelets  Renda Rolls, PharmD, [facility] 11/10/2021 11:57 PM

## 2021-11-10 NOTE — ED Notes (Signed)
Katie NP notified of critical troponin

## 2021-11-11 ENCOUNTER — Encounter
Admission: EM | Disposition: A | Discharge status: Home / Self Care | Payer: Self-pay | Source: Home / Self Care | Attending: Family Medicine

## 2021-11-11 DIAGNOSIS — J441 Chronic obstructive pulmonary disease with (acute) exacerbation: Secondary | ICD-10-CM | POA: Diagnosis not present

## 2021-11-11 DIAGNOSIS — I2510 Atherosclerotic heart disease of native coronary artery without angina pectoris: Secondary | ICD-10-CM

## 2021-11-11 DIAGNOSIS — I214 Non-ST elevation (NSTEMI) myocardial infarction: Principal | ICD-10-CM

## 2021-11-11 HISTORY — PX: LEFT HEART CATH AND CORONARY ANGIOGRAPHY: CATH118249

## 2021-11-11 HISTORY — PX: CORONARY STENT INTERVENTION: CATH118234

## 2021-11-11 LAB — BASIC METABOLIC PANEL
Anion gap: 8 (ref 5–15)
BUN: 23 mg/dL — ABNORMAL HIGH (ref 6–20)
CO2: 27 mmol/L (ref 22–32)
Calcium: 8.9 mg/dL (ref 8.9–10.3)
Chloride: 102 mmol/L (ref 98–111)
Creatinine, Ser: 1.18 mg/dL (ref 0.61–1.24)
GFR, Estimated: >60 mL/min (ref >60)
Glucose, Bld: 105 mg/dL — ABNORMAL HIGH (ref 70–99)
Potassium: 4 mmol/L (ref 3.5–5.1)
Sodium: 137 mmol/L (ref 135–145)

## 2021-11-11 LAB — CBC
HCT: 37.3 % — ABNORMAL LOW (ref 39.0–52.0)
Hemoglobin: 11.8 g/dL — ABNORMAL LOW (ref 13.0–17.0)
MCH: 26.6 pg (ref 26.0–34.0)
MCHC: 31.6 g/dL (ref 30.0–36.0)
MCV: 84 fL (ref 80.0–100.0)
Platelets: 338 10*3/uL (ref 150–400)
RBC: 4.44 MIL/uL (ref 4.22–5.81)
RDW: 15 % (ref 11.5–15.5)
WBC: 13.8 10*3/uL — ABNORMAL HIGH (ref 4.0–10.5)
nRBC: 0 % (ref 0.0–0.2)

## 2021-11-11 LAB — GLUCOSE, CAPILLARY
Glucose-Capillary: 96 mg/dL (ref 70–99)
Glucose-Capillary: 104 mg/dL — ABNORMAL HIGH (ref 70–99)
Glucose-Capillary: 150 mg/dL — ABNORMAL HIGH (ref 70–99)
Glucose-Capillary: 115 mg/dL — ABNORMAL HIGH (ref 70–99)

## 2021-11-11 LAB — HEPARIN LEVEL (UNFRACTIONATED): Heparin Unfractionated: 0.29 IU/mL — ABNORMAL LOW (ref 0.30–0.70)

## 2021-11-11 LAB — POCT ACTIVATED CLOTTING TIME
Activated Clotting Time: 275 seconds
Activated Clotting Time: 293 seconds

## 2021-11-11 SURGERY — LEFT HEART CATH AND CORONARY ANGIOGRAPHY
Anesthesia: Moderate Sedation

## 2021-11-11 MED ORDER — SODIUM CHLORIDE 0.9 % IV BOLUS
INTRAVENOUS | Status: DC | PRN
  Administered 2021-11-11: 250 mL via INTRAVENOUS

## 2021-11-11 MED ORDER — IOHEXOL 300 MG/ML  SOLN
INTRAMUSCULAR | Status: DC | PRN
  Administered 2021-11-11: 90 mL

## 2021-11-11 MED ORDER — FENTANYL CITRATE (PF) 100 MCG/2ML IJ SOLN
INTRAMUSCULAR | Status: DC | PRN
  Administered 2021-11-11: 25 ug via INTRAVENOUS

## 2021-11-11 MED ORDER — NITROGLYCERIN 1 MG/10 ML FOR IR/CATH LAB
INTRA_ARTERIAL | Status: DC | PRN
  Administered 2021-11-11 (×2): 100 ug via INTRACORONARY

## 2021-11-11 MED ORDER — NITROGLYCERIN 1 MG/10 ML FOR IR/CATH LAB
INTRA_ARTERIAL | Status: AC
  Filled 2021-11-11: qty 10

## 2021-11-11 MED ORDER — IPRATROPIUM-ALBUTEROL 0.5-2.5 (3) MG/3ML IN SOLN
RESPIRATORY_TRACT | Status: AC
  Administered 2021-11-11: 3 mL
  Filled 2021-11-11: qty 3

## 2021-11-11 MED ORDER — HEPARIN SODIUM (PORCINE) 1000 UNIT/ML IJ SOLN
INTRAMUSCULAR | Status: AC
  Filled 2021-11-11: qty 10

## 2021-11-11 MED ORDER — PRASUGREL HCL 10 MG PO TABS
10.0000 mg | ORAL_TABLET | Freq: Every day | ORAL | Status: DC
  Administered 2021-11-12 – 2021-11-13 (×2): 10 mg via ORAL
  Filled 2021-11-11 (×3): qty 1

## 2021-11-11 MED ORDER — VERAPAMIL HCL 2.5 MG/ML IV SOLN
INTRAVENOUS | Status: AC
  Filled 2021-11-11: qty 2

## 2021-11-11 MED ORDER — IPRATROPIUM-ALBUTEROL 0.5-2.5 (3) MG/3ML IN SOLN: 3.0000 mL | Freq: Two times a day (BID) | RESPIRATORY_TRACT | Status: DC

## 2021-11-11 MED ORDER — LIDOCAINE HCL 1 % IJ SOLN
INTRAMUSCULAR | Status: AC
  Filled 2021-11-11: qty 20

## 2021-11-11 MED ORDER — FENTANYL CITRATE (PF) 100 MCG/2ML IJ SOLN
INTRAMUSCULAR | Status: AC
  Filled 2021-11-11: qty 2

## 2021-11-11 MED ORDER — ENSURE ENLIVE PO LIQD
237.0000 mL | Freq: Two times a day (BID) | ORAL | Status: DC
  Administered 2021-11-11 – 2021-11-13 (×2): 237 mL via ORAL

## 2021-11-11 MED ORDER — MIDAZOLAM HCL 2 MG/2ML IJ SOLN
INTRAMUSCULAR | Status: DC | PRN
  Administered 2021-11-11: 1 mg via INTRAVENOUS

## 2021-11-11 MED ORDER — SODIUM CHLORIDE 0.9 % IV SOLN: 250.0000 mL | INTRAVENOUS | Status: DC | PRN

## 2021-11-11 MED ORDER — PRASUGREL HCL 10 MG PO TABS
ORAL_TABLET | ORAL | Status: AC
  Filled 2021-11-11: qty 6

## 2021-11-11 MED ORDER — IPRATROPIUM-ALBUTEROL 0.5-2.5 (3) MG/3ML IN SOLN
3.0000 mL | Freq: Two times a day (BID) | RESPIRATORY_TRACT | Status: DC
  Administered 2021-11-11 – 2021-11-14 (×6): 3 mL via RESPIRATORY_TRACT
  Filled 2021-11-11 (×7): qty 3

## 2021-11-11 MED ORDER — PROSOURCE PLUS PO LIQD
30.0000 mL | Freq: Two times a day (BID) | ORAL | Status: DC
  Administered 2021-11-11: 30 mL via ORAL

## 2021-11-11 MED ORDER — LIDOCAINE HCL (PF) 1 % IJ SOLN
INTRAMUSCULAR | Status: DC | PRN
  Administered 2021-11-11: 2 mL

## 2021-11-11 MED ORDER — HEPARIN BOLUS VIA INFUSION
1500.0000 [IU] | Freq: Once | INTRAVENOUS | Status: AC
  Administered 2021-11-11: 1500 [IU] via INTRAVENOUS
  Filled 2021-11-11: qty 1500

## 2021-11-11 MED ORDER — MIDAZOLAM HCL 2 MG/2ML IJ SOLN
INTRAMUSCULAR | Status: AC
  Filled 2021-11-11: qty 2

## 2021-11-11 MED ORDER — SODIUM CHLORIDE 0.9% FLUSH
3.0000 mL | Freq: Two times a day (BID) | INTRAVENOUS | Status: DC
  Administered 2021-11-11 – 2021-11-13 (×5): 3 mL via INTRAVENOUS

## 2021-11-11 MED ORDER — SODIUM CHLORIDE 0.9 % IV SOLN: INTRAVENOUS | Status: AC

## 2021-11-11 MED ORDER — HEPARIN (PORCINE) IN NACL 1000-0.9 UT/500ML-% IV SOLN
INTRAVENOUS | Status: DC | PRN
  Administered 2021-11-11: 1000 mL

## 2021-11-11 MED ORDER — ENOXAPARIN SODIUM 40 MG/0.4ML IJ SOSY
40.0000 mg | PREFILLED_SYRINGE | INTRAMUSCULAR | Status: DC
  Administered 2021-11-12: 40 mg via SUBCUTANEOUS
  Filled 2021-11-11 (×2): qty 0.4

## 2021-11-11 MED ORDER — SODIUM CHLORIDE 0.9% FLUSH: 3.0000 mL | INTRAVENOUS | Status: DC | PRN

## 2021-11-11 MED ORDER — PRASUGREL HCL 10 MG PO TABS
ORAL_TABLET | ORAL | Status: DC | PRN
  Administered 2021-11-11: 60 mg via ORAL

## 2021-11-11 MED ORDER — HYDRALAZINE HCL 20 MG/ML IJ SOLN: 10.0000 mg | INTRAMUSCULAR | Status: AC | PRN

## 2021-11-11 MED ORDER — ASPIRIN 81 MG PO TBEC
81.0000 mg | DELAYED_RELEASE_TABLET | Freq: Every day | ORAL | Status: DC
  Administered 2021-11-12 – 2021-11-13 (×2): 81 mg via ORAL
  Filled 2021-11-11 (×2): qty 1

## 2021-11-11 MED ORDER — VERAPAMIL HCL 2.5 MG/ML IV SOLN
INTRAVENOUS | Status: DC | PRN
  Administered 2021-11-11: 2.5 mg via INTRA_ARTERIAL

## 2021-11-11 MED ORDER — HEPARIN SODIUM (PORCINE) 1000 UNIT/ML IJ SOLN
INTRAMUSCULAR | Status: DC | PRN
  Administered 2021-11-11: 5000 [IU] via INTRAVENOUS
  Administered 2021-11-11: 2000 [IU] via INTRAVENOUS
  Administered 2021-11-11: 7000 [IU] via INTRAVENOUS

## 2021-11-11 MED ORDER — HEPARIN (PORCINE) IN NACL 1000-0.9 UT/500ML-% IV SOLN
INTRAVENOUS | Status: AC
  Filled 2021-11-11: qty 1000

## 2021-11-11 SURGICAL SUPPLY — 20 items
BALLN TREK RX 2.25X12 (BALLOONS) ×1
BALLN ~~LOC~~ EUPHORA RX 3.25X20 (BALLOONS) ×1
BALLOON TREK RX 2.25X12 (BALLOONS) IMPLANT
BALLOON ~~LOC~~ EUPHORA RX 3.25X20 (BALLOONS) IMPLANT
CATH 5F 110X4 TIG (CATHETERS) IMPLANT
CATH LAUNCHER 6FR JR4 (CATHETERS) IMPLANT
DEVICE RAD TR BAND REGULAR (VASCULAR PRODUCTS) IMPLANT
DRAPE BRACHIAL (DRAPES) IMPLANT
GLIDESHEATH SLEND SS 6F .021 (SHEATH) IMPLANT
GUIDEWIRE INQWIRE 1.5J.035X260 (WIRE) IMPLANT
INQWIRE 1.5J .035X260CM (WIRE) ×1
KIT ENCORE 26 ADVANTAGE (KITS) IMPLANT
KIT SYRINGE INJ CVI SPIKEX1 (MISCELLANEOUS) IMPLANT
PACK CARDIAC CATH (CUSTOM PROCEDURE TRAY) ×1 IMPLANT
PROTECTION STATION PRESSURIZED (MISCELLANEOUS) ×1
SET ATX SIMPLICITY (MISCELLANEOUS) IMPLANT
STATION PROTECTION PRESSURIZED (MISCELLANEOUS) IMPLANT
STENT ONYX FRONTIER 3.0X38 (Permanent Stent) IMPLANT
TUBING CIL FLEX 10 FLL-RA (TUBING) IMPLANT
WIRE RUNTHROUGH .014X180CM (WIRE) IMPLANT

## 2021-11-11 NOTE — Progress Notes (Signed)
PROGRESS NOTE    [name].  WYO:[id number] DOB: 14-Apr-1965 DOA: 11/09/2021  PCP: Howard Pouch, NP   Brief Narrative: This 56 years old male with PMH significant for COPD, type 2 diabetes, CAD, hypertension, hyperlipidemia presented in the ED with complaints of worsening shortness of breath.  She has developed shortness of breath and productive cough for about a week.  She has been using her regular inhalers more frequently without any improvement.  Patient also reports chest pain occurring with cough.  She also reports chest pain when she exerts herself but this has been ongoing for more than a month.  She was hypoxic with SPO2 of 83% on room air placed on 4 L of supplemental oxygen.  CT chest ruled out PE however shows interval development of diffuse tree-in-bud nodularity involving all lobes of both lungs.  Patient is admitted for acute hypoxic respiratory failure likely secondary to COPD exacerbation.  Assessment & Plan:   Principal Problem:   COPD with acute exacerbation ([place]) Active Problems:   Stable angina   Essential hypertension   DM2 (diabetes mellitus, type 2) (HCC)   Neurogenic bladder   Depression   Non-ST elevation (NSTEMI) myocardial infarction ([place])  Acute hypoxic respiratory failure: COPD exacerbation: Patient presenting with 1 week history of increased shortness of breath , productive cough requiring increased use of home inhalers. Initially able to tolerate supplemental oxygen, however due to increased work of breathing she was placed on BiPAP. CT imaging notable for tree-in-bud nodularity concerning for infectious bronchitis.  Given history of COPD and active smoking, will obtain sputum cultures. Continue supplemental oxygen as needed to maintain oxygen saturation above 88% Continue BiPAP at night and as needed.  Wean as tolerated Continue nebulized bronchodilators scheduled and as needed S/p Solu-Medrol 125 mg.  Continue prednisone 40 mg daily to  complete a 5-day course. Given severity of exacerbation, will continue CAP coverage with ceftriaxone and azithromycin.   Can likely discontinue once clinically improved. Sputum cultures pending   NSTEMI: Patient reports intermittent chest pain for 1 month with exertion. Troponin 12> 17> 20> 126>229 Echocardiogram shows LVEF 60 to 70% no regional wall motion abnormalities. Patient denies any chest pain. Patient initiated on iv heparin Cardiology was consulted, She is scheduled for LHC.  Essential hypertension: Currently blood pressure stable.   Continue home medications  Type 2 diabetes: Hold p.o. diabetic medications,  regular insulin sliding scale   Major depression: Resume Lexapro, Wellbutrin and Abilify.   Neurogenic bladder Continue oxybutynin.  Obesity Estimated body mass index is 38.66 kg/m as calculated from the following:   Height as of this encounter: 5' 7.99" (1.727 m).   Weight as of this encounter: 115.3 kg.    DVT prophylaxis: Heparin gtt Code Status: Full code. Family Communication: No family at bed side. Disposition Plan:   Status is: Inpatient Remains inpatient appropriate because: Admitted for acute hypoxic respiratory failure secondary to COPD exacerbation,  also found to have NSTEMI requiring heparin drip,  scheduled for left heart cath today.   Consultants:  Cardiology  Procedures: scheduLed LHC 10/31.  Antimicrobials:  Anti-infectives (From admission, onward)    Start     Dose/Rate Route Frequency Ordered Stop   11/10/21 1500  cefTRIAXone (ROCEPHIN) 2 g in sodium chloride 0.9 % 100 mL IVPB        2 g 200 mL/hr over 30 Minutes Intravenous Every 24 hours 11/09/21 1753 11/14/21 1459   11/10/21 1000  azithromycin (ZITHROMAX) tablet 500 mg  500 mg Oral Daily 11/09/21 1753 11/14/21 0959   11/09/21 1445  cefTRIAXone (ROCEPHIN) 2 g in sodium chloride 0.9 % 100 mL IVPB        2 g 200 mL/hr over 30 Minutes Intravenous  Once 11/09/21 1442  11/09/21 1543   11/09/21 1445  azithromycin (ZITHROMAX) tablet 500 mg  Status:  Discontinued        500 mg Oral Daily 11/09/21 1442 11/09/21 1754        Subjective: Patient was seen and examined at bedside.  Overnight events noted. Patient was sitting comfortably on the bed,  remains on 4 L of supplemental oxygen. She denies any chest pain,  states cardiology has scheduled her for left heart cath today.   Objective: Vitals:   11/11/21 1303 11/11/21 1330 11/11/21 1400 11/11/21 1430  BP: (!) 89/46 (!) 89/75 109/79 98/80  Pulse: 98 (!) 109 (!) 109 92  Resp: 20 (!) 27 (!) 25 (!) 23  Temp:      TempSrc:      SpO2: 92% 94% 95% 94%  Weight:      Height:        Intake/Output Summary (Last 24 hours) at 11/11/2021 1533 Last data filed at 11/11/2021 0817 Gross per 24 hour  Intake 1471.52 ml  Output 1250 ml  Net 221.52 ml    Filed Weights   11/09/21 1710 11/10/21 2109  Weight: 128 kg 115.3 kg    Examination:  General exam: Appears comfortable, not in any acute distress, deconditioned Respiratory system: Decreased breath sounds, respiratory effort normal, no accessory muscle use, RR 15 Cardiovascular system: S1 & S2 heard, regular rhythm, no murmur. Gastrointestinal system: Abdomen is soft, non tender, non distended, BS+ Central nervous system: Alert and oriented X 3. No focal neurological deficits. Extremities: No edema, no cyanosis, no clubbing Skin: No rashes, lesions or ulcers Psychiatry: Judgement and insight appear normal. Mood & affect appropriate.     Data Reviewed: I have personally reviewed following labs and imaging studies  CBC: Recent Labs  Lab 11/09/21 1230 11/10/21 0325 11/11/21 0606  WBC 7.9 8.5 13.8*  HGB 12.8* 13.0 11.8*  HCT 39.1 40.0 37.3*  MCV 80.5 81.3 84.0  PLT 309 304 962   Basic Metabolic Panel: Recent Labs  Lab 11/09/21 1230 11/10/21 0025 11/11/21 0606  NA 134* 133* 137  K 3.9 4.8 4.0  CL 97* 99 102  CO2 '25 24 27  '$ GLUCOSE 126*  177* 105*  BUN 17 20 23*  CREATININE 1.28* 1.36* 1.18  CALCIUM 9.1 9.2 8.9   GFR: Estimated Creatinine Clearance (by C-G formula based on SCr of 1.18 mg/dL) Male: 71 mL/min Male: 86.2 mL/min Liver Function Tests: Recent Labs  Lab 11/09/21 1230  AST 20  ALT 18  ALKPHOS 69  BILITOT 0.8  PROT 8.0  ALBUMIN 3.5   Recent Labs  Lab 11/09/21 1230  LIPASE 30   No results for input(s): "AMMONIA" in the last 168 hours. Coagulation Profile: Recent Labs  Lab 11/10/21 0633  INR 1.1   Cardiac Enzymes: No results for input(s): "CKTOTAL", "CKMB", "CKMBINDEX", "TROPONINI" in the last 168 hours. BNP (last 3 results) No results for input(s): "PROBNP" in the last 8760 hours. HbA1C: No results for input(s): "HGBA1C" in the last 72 hours. CBG: Recent Labs  Lab 11/10/21 0730 11/10/21 1303 11/10/21 2128 11/11/21 0814 11/11/21 1103  GLUCAP 123* 149* 157* 96 104*   Lipid Profile: No results for input(s): "CHOL", "HDL", "LDLCALC", "TRIG", "CHOLHDL", "LDLDIRECT" in the last  72 hours. Thyroid Function Tests: No results for input(s): "TSH", "T4TOTAL", "FREET4", "T3FREE", "THYROIDAB" in the last 72 hours. Anemia Panel: No results for input(s): "VITAMINB12", "FOLATE", "FERRITIN", "TIBC", "IRON", "RETICCTPCT" in the last 72 hours. Sepsis Labs: Recent Labs  Lab 11/09/21 1442  PROCALCITON <0.10    Recent Results (from the past 240 hour(s))  Resp Panel by RT-PCR (Flu A&B, Covid) Anterior Nasal Swab     Status: None   Collection Time: 11/09/21 12:30 PM   Specimen: Anterior Nasal Swab  Result Value Ref Range Status   SARS Coronavirus 2 by RT PCR NEGATIVE NEGATIVE Final    Comment: (NOTE) SARS-CoV-2 target nucleic acids are NOT DETECTED.  The SARS-CoV-2 RNA is generally detectable in upper respiratory specimens during the acute phase of infection. The lowest concentration of SARS-CoV-2 viral copies this assay can detect is 138 copies/mL. A negative result does not preclude  SARS-Cov-2 infection and should not be used as the sole basis for treatment or other patient management decisions. A negative result may occur with  improper specimen collection/handling, submission of specimen other than nasopharyngeal swab, presence of viral mutation(s) within the areas targeted by this assay, and inadequate number of viral copies(<138 copies/mL). A negative result must be combined with clinical observations, patient history, and epidemiological information. The expected result is Negative.  Fact Sheet for Patients:  EntrepreneurPulse.com.au  Fact Sheet for Healthcare Providers:  IncredibleEmployment.be  This test is no t yet approved or cleared by the [place] FDA and  has been authorized for detection and/or diagnosis of SARS-CoV-2 by FDA under an Emergency Use Authorization (EUA). This EUA will remain  in effect (meaning this test can be used) for the duration of the COVID-19 declaration under Section 564(b)(1) of the Act, 21 U.S.C.section 360bbb-3(b)(1), unless the authorization is terminated  or revoked sooner.       Influenza A by PCR NEGATIVE NEGATIVE Final   Influenza B by PCR NEGATIVE NEGATIVE Final    Comment: (NOTE) The Xpert Xpress SARS-CoV-2/FLU/RSV plus assay is intended as an aid in the diagnosis of influenza from Nasopharyngeal swab specimens and should not be used as a sole basis for treatment. Nasal washings and aspirates are unacceptable for Xpert Xpress SARS-CoV-2/FLU/RSV testing.  Fact Sheet for Patients: EntrepreneurPulse.com.au  Fact Sheet for Healthcare Providers: IncredibleEmployment.be  This test is not yet approved or cleared by the [place] FDA and has been authorized for detection and/or diagnosis of SARS-CoV-2 by FDA under an Emergency Use Authorization (EUA). This EUA will remain in effect (meaning this test can be used) for the duration of  the COVID-19 declaration under Section 564(b)(1) of the Act, 21 U.S.C. section 360bbb-3(b)(1), unless the authorization is terminated or revoked.  Performed at [facility], [address] [place], [place] [address]   Expectorated Sputum Assessment w Gram Stain, Rflx to Resp Cult     Status: None   Collection Time: 11/10/21  8:30 PM   Specimen: Expectorated Sputum  Result Value Ref Range Status   Specimen Description EXPECTORATED SPUTUM  Final   Special Requests NONE  Final   Sputum evaluation   Final    THIS SPECIMEN IS ACCEPTABLE FOR SPUTUM CULTURE Performed at [facility], [address] [place], [place] [address]    Report Status 11/10/2021 FINAL  Final    Radiology Studies: CARDIAC CATHETERIZATION  Result Date: 11/11/2021 Conclusions: Severe single-vessel coronary artery disease with thrombotic-appearing 80-90% mid RCA stenosis followed by irregular segment of up to 60-70% stenosis in  the mid/distal RCA.  There is mild-moderate, nonobstructive coronary artery disease involving the LAD, OM branches, and proximal RCA. Mildly elevated left heart filling pressure (LVEDP 15-20 mmHg with significant respiratory variation). Successful PCI to mid/distal RCA using Onyx Frontier 3.0 x 38 mm drug-eluting stent (post-dilated to 3.3 mm) with 0% residual stenosis and TIMI-3 flow. Recommendations: Dual antiplatelet therapy with aspirin and prasugrel for at least 12 months. Aggressive secondary prevention of coronary artery disease. Nelva Bush, MD [facility]  ECHOCARDIOGRAM COMPLETE  Result Date: 11/10/2021    ECHOCARDIOGRAM REPORT   Patient Name:   Orland Visconti. Date of Exam: 11/10/2021 Medical Rec #:  [id number]            Height:       68.0 in Accession #:    [id number]           Weight:       282.2 lb Date of Birth:  07/28/1965            BSA:          2.366 m Patient Age:    60 years             BP:           138/92 mmHg Patient Gender: M                     HR:           102 bpm. Exam Location:  [facility] Procedure: 2D Echo, Color Doppler, Cardiac Doppler and Intracardiac            Opacification Agent Indications:     R07.9 Chest Pain  History:         Patient has prior history of Echocardiogram examinations, most                  recent 03/13/2019. CAD, COPD; Risk Factors:Hypertension,                  Diabetes, Dyslipidemia and Sleep Apnea.  Sonographer:     Charmayne Sheer Referring Phys:  [id number] Jose Persia Diagnosing Phys: Yolonda Kida MD  Sonographer Comments: Suboptimal parasternal window. Image acquisition challenging due to patient body habitus. IMPRESSIONS  1. Left ventricular ejection fraction, by estimation, is 65 to 70%. The left ventricle has normal function. The left ventricle has no regional wall motion abnormalities. Left ventricular diastolic parameters are consistent with Grade I diastolic dysfunction (impaired relaxation).  2. Right ventricular systolic function is normal. The right ventricular size is normal.  3. The mitral valve is normal in structure. Mild mitral valve regurgitation.  4. The aortic valve is normal in structure. Aortic valve regurgitation is trivial. FINDINGS  Left Ventricle: Left ventricular ejection fraction, by estimation, is 65 to 70%. The left ventricle has normal function. The left ventricle has no regional wall motion abnormalities. Definity contrast agent was given IV to delineate the left ventricular  endocardial borders. The left ventricular internal cavity size was normal in size. There is no left ventricular hypertrophy. Left ventricular diastolic parameters are consistent with Grade I diastolic dysfunction (impaired relaxation). Right Ventricle: The right ventricular size is normal. No increase in right ventricular wall thickness. Right ventricular systolic function is normal. Left Atrium: Left atrial size was normal in size. Right Atrium: Right atrial size was normal in size. Pericardium: There is no evidence of  pericardial effusion. Mitral Valve: The mitral valve is normal in structure. Mild mitral valve regurgitation.  Tricuspid Valve: The tricuspid valve is normal in structure. Tricuspid valve regurgitation is mild. Aortic Valve: The aortic valve is normal in structure. Aortic valve regurgitation is trivial. Aortic valve mean gradient measures 5.0 mmHg. Aortic valve peak gradient measures 9.4 mmHg. Aortic valve area, by VTI measures 2.42 cm. Pulmonic Valve: The pulmonic valve was normal in structure. Pulmonic valve regurgitation is not visualized. Aorta: The ascending aorta was not well visualized. IAS/Shunts: No atrial level shunt detected by color flow Doppler.  LEFT VENTRICLE PLAX 2D LVIDd:         4.70 cm   Diastology LVIDs:         2.90 cm   LV e' medial:    10.00 cm/s LV PW:         1.00 cm   LV E/e' medial:  7.4 LV IVS:        0.90 cm   LV e' lateral:   10.90 cm/s LVOT diam:     1.90 cm   LV E/e' lateral: 6.8 LV SV:         58 LV SV Index:   24 LVOT Area:     2.84 cm  RIGHT VENTRICLE RV Basal diam:  3.40 cm TAPSE (M-mode): 3.0 cm LEFT ATRIUM           Index        RIGHT ATRIUM           Index LA diam:      3.80 cm 1.61 cm/m   RA Area:     14.80 cm LA Vol (A4C): 25.5 ml 10.78 ml/m  RA Volume:   36.60 ml  15.47 ml/m  AORTIC VALVE                     PULMONIC VALVE AV Area (Vmax):    2.52 cm      PV Vmax:       0.87 m/s AV Area (Vmean):   2.38 cm      PV Vmean:      61.400 cm/s AV Area (VTI):     2.42 cm      PV VTI:        0.125 m AV Vmax:           153.00 cm/s   PV Peak grad:  3.0 mmHg AV Vmean:          107.000 cm/s  PV Mean grad:  2.0 mmHg AV VTI:            0.238 m AV Peak Grad:      9.4 mmHg AV Mean Grad:      5.0 mmHg LVOT Vmax:         136.00 cm/s LVOT Vmean:        89.900 cm/s LVOT VTI:          0.203 m LVOT/AV VTI ratio: 0.85  AORTA Ao Root diam: 2.50 cm MITRAL VALVE MV Area (PHT): 4.41 cm    SHUNTS MV Decel Time: 172 msec    Systemic VTI:  0.20 m MV E velocity: 74.50 cm/s  Systemic Diam: 1.90 cm  MV A velocity: 80.80 cm/s MV E/A ratio:  0.92 Dwayne D Callwood MD Electronically signed by Yolonda Kida MD Signature Date/Time: 11/10/2021/1:17:28 PM    Final     Scheduled Meds:  (feeding supplement) PROSource Plus  30 mL Oral BID BM   ARIPiprazole  5 mg Oral Daily   atorvastatin  40 mg Oral QPM  azithromycin  500 mg Oral Daily   [START ON 11/12/2021] enoxaparin (LOVENOX) injection  40 mg Subcutaneous Q24H   escitalopram  20 mg Oral Daily   estradiol  2 mg Oral BID   famotidine  40 mg Oral Daily   feeding supplement  237 mL Oral BID BM   fluticasone furoate-vilanterol  1 puff Inhalation Daily   And   umeclidinium bromide  1 puff Inhalation Daily   gabapentin  600 mg Oral TID   insulin aspart  0-15 Units Subcutaneous TID WC   ipratropium-albuterol  3 mL Nebulization BID   lidocaine  1 patch Transdermal Q24H   multivitamin with minerals  1 tablet Oral Daily   nicotine  7 mg Transdermal Daily   oxybutynin  5 mg Oral BID   [START ON 11/12/2021] prasugrel  10 mg Oral Daily   predniSONE  40 mg Oral Q breakfast   sodium chloride flush  3 mL Intravenous Q12H   sodium chloride flush  3 mL Intravenous Q12H   sodium chloride flush  3 mL Intravenous Q12H   spironolactone  75 mg Oral BID   Continuous Infusions:  sodium chloride 75 mL/hr at 11/11/21 1532   sodium chloride     cefTRIAXone (ROCEPHIN)  IV       LOS: 1 day    Time spent: [place], MD [facility]   If 7PM-7AM, please contact night-coverage

## 2021-11-11 NOTE — Progress Notes (Signed)
Rounding Note    Patient Name: [name]. Date of Encounter: 11/11/2021  [facility] Cardiologist: Kate Sable, MD   Subjective   No further chest pain reported.  Shortness of breath has improved but is not back to baseline.  Inpatient Medications    Scheduled Meds:  (feeding supplement) PROSource Plus  30 mL Oral BID BM   ARIPiprazole  5 mg Oral Daily   atorvastatin  40 mg Oral QPM   azithromycin  500 mg Oral Daily   [START ON 11/12/2021] enoxaparin (LOVENOX) injection  40 mg Subcutaneous Q24H   escitalopram  20 mg Oral Daily   estradiol  2 mg Oral BID   famotidine  40 mg Oral Daily   feeding supplement  237 mL Oral BID BM   fluticasone furoate-vilanterol  1 puff Inhalation Daily   And   umeclidinium bromide  1 puff Inhalation Daily   gabapentin  600 mg Oral TID   insulin aspart  0-15 Units Subcutaneous TID WC   ipratropium-albuterol  3 mL Nebulization BID   lidocaine  1 patch Transdermal Q24H   multivitamin with minerals  1 tablet Oral Daily   nicotine  7 mg Transdermal Daily   oxybutynin  5 mg Oral BID   [START ON 11/12/2021] prasugrel  10 mg Oral Daily   predniSONE  40 mg Oral Q breakfast   sodium chloride flush  3 mL Intravenous Q12H   sodium chloride flush  3 mL Intravenous Q12H   sodium chloride flush  3 mL Intravenous Q12H   spironolactone  75 mg Oral BID   Continuous Infusions:  sodium chloride     cefTRIAXone (ROCEPHIN)  IV Stopped (11/11/21 1622)   PRN Meds: sodium chloride, acetaminophen **OR** acetaminophen, nitroGLYCERIN, ondansetron **OR** ondansetron (ZOFRAN) IV, polyethylene glycol, sodium chloride flush   Vital Signs    Vitals:   11/11/21 1400 11/11/21 1430 11/11/21 1705 11/11/21 1944  BP: 109/79 98/80 102/71   Pulse: (!) 109 92 94   Resp: (!) 25 (!) 23 18   Temp:   98.2 F (36.8 C)   TempSrc:   Oral   SpO2: 95% 94% 95% 96%  Weight:      Height:        Intake/Output Summary (Last 24 hours) at 11/11/2021  2132 Last data filed at 11/11/2021 1706 Gross per 24 hour  Intake 804.64 ml  Output 1150 ml  Net -345.36 ml      11/10/2021    9:09 PM 11/09/2021    5:10 PM 08/25/2021   11:38 AM  Last 3 Weights  Weight (lbs) 254 lb 3.1 oz 282 lb 3 oz 294 lb  Weight (kg) 115.3 kg 128 kg 133.358 kg      Telemetry    Sinus rhythm - Personally Reviewed  ECG    Significant artifact.  Normal sinus rhythm with rightward axis and nonspecific ST segment changes.- Personally Reviewed  Physical Exam   GEN: No acute distress.   Neck: No JVD Cardiac: RRR, no murmurs, rubs, or gallops.  Respiratory: Diminished breath sounds bilaterally with scattered rhonchi and wheezes. GI: Soft, nontender, non-distended  MS: No edema; No deformity. Neuro:  Nonfocal  Psych: Normal affect   Labs    High Sensitivity Troponin:   Recent Labs  Lab 11/09/21 1230 11/09/21 1550 11/10/21 0024 11/10/21 0325 11/10/21 0548  TROPONINIHS 12 17 20* 126* 229*     Chemistry Recent Labs  Lab 11/09/21 1230 11/10/21 0025 11/11/21 0606  NA  134* 133* 137  K 3.9 4.8 4.0  CL 97* 99 102  CO2 '25 24 27  '$ GLUCOSE 126* 177* 105*  BUN 17 20 23*  CREATININE 1.28* 1.36* 1.18  CALCIUM 9.1 9.2 8.9  PROT 8.0  --   --   ALBUMIN 3.5  --   --   AST 20  --   --   ALT 18  --   --   ALKPHOS 69  --   --   BILITOT 0.8  --   --   GFRNONAA >60 >60 >60  ANIONGAP '12 10 8    '$ Lipids No results for input(s): "CHOL", "TRIG", "HDL", "LABVLDL", "LDLCALC", "CHOLHDL" in the last 168 hours.  Hematology Recent Labs  Lab 11/09/21 1230 11/10/21 0325 11/11/21 0606  WBC 7.9 8.5 13.8*  RBC 4.86 4.92 4.44  HGB 12.8* 13.0 11.8*  HCT 39.1 40.0 37.3*  MCV 80.5 81.3 84.0  MCH 26.3 26.4 26.6  MCHC 32.7 32.5 31.6  RDW 15.0 15.1 15.0  PLT 309 304 338   Thyroid No results for input(s): "TSH", "FREET4" in the last 168 hours.  BNP Recent Labs  Lab 11/09/21 1230  BNP 44.9    DDimer No results for input(s): "DDIMER" in the last 168 hours.    Radiology    CARDIAC CATHETERIZATION  Result Date: 11/11/2021 Conclusions: Severe single-vessel coronary artery disease with thrombotic-appearing 80-90% mid RCA stenosis followed by irregular segment of up to 60-70% stenosis in the mid/distal RCA.  There is mild-moderate, nonobstructive coronary artery disease involving the LAD, OM branches, and proximal RCA. Mildly elevated left heart filling pressure (LVEDP 15-20 mmHg with significant respiratory variation). Successful PCI to mid/distal RCA using Onyx Frontier 3.0 x 38 mm drug-eluting stent (post-dilated to 3.3 mm) with 0% residual stenosis and TIMI-3 flow. Recommendations: Dual antiplatelet therapy with aspirin and prasugrel for at least 12 months. Aggressive secondary prevention of coronary artery disease. Nelva Bush, MD [facility]  ECHOCARDIOGRAM COMPLETE  Result Date: 11/10/2021    ECHOCARDIOGRAM REPORT   Patient Name:   [name]. Date of Exam: 11/10/2021 Medical Rec #:  [id number]            Height:       68.0 in Accession #:    [id number]           Weight:       282.2 lb Date of Birth:  12-Feb-1965            BSA:          2.366 m Patient Age:    56 years             BP:           138/92 mmHg Patient Gender: M                    HR:           102 bpm. Exam Location:  [facility] Procedure: 2D Echo, Color Doppler, Cardiac Doppler and Intracardiac            Opacification Agent Indications:     R07.9 Chest Pain  History:         Patient has prior history of Echocardiogram examinations, most                  recent 03/13/2019. CAD, COPD; Risk Factors:Hypertension,                  Diabetes, Dyslipidemia and  Sleep Apnea.  Sonographer:     Charmayne Sheer Referring Phys:  [id number] Jose Persia Diagnosing Phys: Yolonda Kida MD  Sonographer Comments: Suboptimal parasternal window. Image acquisition challenging due to patient body habitus. IMPRESSIONS  1. Left ventricular ejection fraction, by estimation, is 65 to 70%. The left ventricle has  normal function. The left ventricle has no regional wall motion abnormalities. Left ventricular diastolic parameters are consistent with Grade I diastolic dysfunction (impaired relaxation).  2. Right ventricular systolic function is normal. The right ventricular size is normal.  3. The mitral valve is normal in structure. Mild mitral valve regurgitation.  4. The aortic valve is normal in structure. Aortic valve regurgitation is trivial. FINDINGS  Left Ventricle: Left ventricular ejection fraction, by estimation, is 65 to 70%. The left ventricle has normal function. The left ventricle has no regional wall motion abnormalities. Definity contrast agent was given IV to delineate the left ventricular  endocardial borders. The left ventricular internal cavity size was normal in size. There is no left ventricular hypertrophy. Left ventricular diastolic parameters are consistent with Grade I diastolic dysfunction (impaired relaxation). Right Ventricle: The right ventricular size is normal. No increase in right ventricular wall thickness. Right ventricular systolic function is normal. Left Atrium: Left atrial size was normal in size. Right Atrium: Right atrial size was normal in size. Pericardium: There is no evidence of pericardial effusion. Mitral Valve: The mitral valve is normal in structure. Mild mitral valve regurgitation. Tricuspid Valve: The tricuspid valve is normal in structure. Tricuspid valve regurgitation is mild. Aortic Valve: The aortic valve is normal in structure. Aortic valve regurgitation is trivial. Aortic valve mean gradient measures 5.0 mmHg. Aortic valve peak gradient measures 9.4 mmHg. Aortic valve area, by VTI measures 2.42 cm. Pulmonic Valve: The pulmonic valve was normal in structure. Pulmonic valve regurgitation is not visualized. Aorta: The ascending aorta was not well visualized. IAS/Shunts: No atrial level shunt detected by color flow Doppler.  LEFT VENTRICLE PLAX 2D LVIDd:         4.70 cm    Diastology LVIDs:         2.90 cm   LV e' medial:    10.00 cm/s LV PW:         1.00 cm   LV E/e' medial:  7.4 LV IVS:        0.90 cm   LV e' lateral:   10.90 cm/s LVOT diam:     1.90 cm   LV E/e' lateral: 6.8 LV SV:         58 LV SV Index:   24 LVOT Area:     2.84 cm  RIGHT VENTRICLE RV Basal diam:  3.40 cm TAPSE (M-mode): 3.0 cm LEFT ATRIUM           Index        RIGHT ATRIUM           Index LA diam:      3.80 cm 1.61 cm/m   RA Area:     14.80 cm LA Vol (A4C): 25.5 ml 10.78 ml/m  RA Volume:   36.60 ml  15.47 ml/m  AORTIC VALVE                     PULMONIC VALVE AV Area (Vmax):    2.52 cm      PV Vmax:       0.87 m/s AV Area (Vmean):   2.38 cm      PV Vmean:  61.400 cm/s AV Area (VTI):     2.42 cm      PV VTI:        0.125 m AV Vmax:           153.00 cm/s   PV Peak grad:  3.0 mmHg AV Vmean:          107.000 cm/s  PV Mean grad:  2.0 mmHg AV VTI:            0.238 m AV Peak Grad:      9.4 mmHg AV Mean Grad:      5.0 mmHg LVOT Vmax:         136.00 cm/s LVOT Vmean:        89.900 cm/s LVOT VTI:          0.203 m LVOT/AV VTI ratio: 0.85  AORTA Ao Root diam: 2.50 cm MITRAL VALVE MV Area (PHT): 4.41 cm    SHUNTS MV Decel Time: 172 msec    Systemic VTI:  0.20 m MV E velocity: 74.50 cm/s  Systemic Diam: 1.90 cm MV A velocity: 80.80 cm/s MV E/A ratio:  0.92 Dwayne D Callwood MD Electronically signed by Yolonda Kida MD Signature Date/Time: 11/10/2021/1:17:28 PM    Final     Cardiac Studies   See above.  Patient Profile     56 y.o. transgender male with history of hypertension, hyperlipidemia, type 2 diabetes mellitus, and COPD, admitted with COPD exacerbation complicated by NSTEMI.  Assessment & Plan    NSTEMI: No further chest pain reported.  Catheterization today showed thrombotic lesion in the mid RCA suggestive of acute plaque rupture.  This was treated with DES x1.  Otherwise, nonobstructive disease was observed. -Continue dual antiplatelet therapy with aspirin and prasugrel for 12  months. -Aggressive secondary prevention with high intensity statin therapy.  COPD and acute on chronic respiratory failure with hypoxia: Oxygen requirement has improved.  Breath sounds remain diminished with scattered wheezes and rhonchi. -Ongoing management including weaning of corticosteroids per primary team.  If respiratory status allows, anticipate discharge home tomorrow from a cardiac standpoint.  For questions or updates, please contact [place] Please consult [email or web address] for contact info under [facility].  Signed, Nelva Bush, MD  11/11/2021, 9:32 PM

## 2021-11-11 NOTE — Progress Notes (Signed)
OT Cancellation Note  Patient Details Name: [name]. MRN: [id number] DOB: Apr 07, 1965   Cancelled Treatment:    Reason Eval/Treat Not Completed: Patient at procedure or test/ unavailable. Pt out of the room for procedure. Will re-attempt OT evaluation at later date/time once pt is available and medically appropriate.   Ardeth Perfect., MPH, MS, OTR/L ascom [phone] 11/11/21, 10:53 AM

## 2021-11-11 NOTE — Progress Notes (Signed)
PT Cancellation Note  Patient Details Name: [name]. MRN: [id number] DOB: June 24, 1965   Cancelled Treatment:    Reason Eval/Treat Not Completed: Patient at procedure or test/unavailable PT orders received, chart reviewed. Pt noted to be out of the room for a procedure. Per chart, plans for cardiac cath today. Will f/u as able.  Lavone Nian, PT, DPT 11/11/21, 11:01 AM   Waunita Schooner 11/11/2021, 11:01 AM

## 2021-11-11 NOTE — Interval H&P Note (Signed)
History and Physical Interval Note:  11/11/2021 11:16 AM  [name].  has presented today for surgery, with the diagnosis of NSTEMI.  The various methods of treatment have been discussed with the patient and family. After consideration of risks, benefits and other options for treatment, the patient has consented to  Procedure(s): LEFT HEART CATH AND CORONARY ANGIOGRAPHY (N/A) as a surgical intervention.  The patient's history has been reviewed, patient examined, no change in status, stable for surgery.  I have reviewed the patient's chart and labs.  Questions were answered to the patient's satisfaction.    Cath Lab Visit (complete for each Cath Lab visit)  Clinical Evaluation Leading to the Procedure:   ACS: Yes.    Non-ACS:  N/A  [name]

## 2021-11-11 NOTE — Progress Notes (Signed)
ANTICOAGULATION CONSULT NOTE   Pharmacy Consult for IV heparin Indication: chest pain/ACS  Allergies  Allergen Reactions   Shellfish Allergy Swelling   Codeine Rash   Lisinopril Cough    Patient Measurements: Height: 5' 7.99" (172.7 cm) Weight: 115.3 kg (254 lb 3.1 oz) IBW/kg (Calculated) : 68.38 Heparin Dosing Weight: 98.2 kg  Vital Signs: Temp: 98 F (36.7 C) (10/31 0423) Temp Source: Oral (10/31 0423) BP: 112/73 (10/31 0423) Pulse Rate: 92 (10/31 0423)  Labs: Recent Labs    11/09/21 1230 11/09/21 1550 11/10/21 0024 11/10/21 0025 11/10/21 0325 11/10/21 0548 11/10/21 9735 11/10/21 1638 11/10/21 2240  HGB 12.8*  --   --   --  13.0  --   --   --   --   HCT 39.1  --   --   --  40.0  --   --   --   --   PLT 309  --   --   --  304  --   --   --   --   APTT  --   --   --   --   --   --  32  --   --   LABPROT  --   --   --   --   --   --  14.4  --   --   INR  --   --   --   --   --   --  1.1  --   --   HEPARINUNFRC  --   --   --   --   --   --   --  0.36 0.20*  CREATININE 1.28*  --   --  1.36*  --   --   --   --   --   TROPONINIHS 12   < > 20*  --  126* 229*  --   --   --    < > = values in this interval not displayed.     Estimated Creatinine Clearance (by C-G formula based on SCr of 1.36 mg/dL (H)) Male: 61.6 mL/min (A) Male: 74.8 mL/min (A)   Medical History: Past Medical History:  Diagnosis Date   Carpal tunnel syndrome    bilateral   COPD (chronic obstructive pulmonary disease) (HCC)    Coronary artery disease    Diabetes mellitus without complication (HCC)    Endotracheally intubated 09/30/2020   Hyperlipidemia    Hypertension    Neuropathy    On mechanically assisted ventilation (HCC) 09/30/2020   Peptic ulcer    Sleep apnea     Medications:  Scheduled:   ARIPiprazole  5 mg Oral Daily   atorvastatin  40 mg Oral QPM   azithromycin  500 mg Oral Daily   escitalopram  20 mg Oral Daily   estradiol  2 mg Oral BID   famotidine  40 mg Oral  Daily   fluticasone furoate-vilanterol  1 puff Inhalation Daily   And   umeclidinium bromide  1 puff Inhalation Daily   gabapentin  600 mg Oral TID   hydrochlorothiazide  12.5 mg Oral Daily   insulin aspart  0-15 Units Subcutaneous TID WC   ipratropium-albuterol  3 mL Nebulization BID   lidocaine  1 patch Transdermal Q24H    morphine injection  2 mg Intravenous Once   multivitamin with minerals  1 tablet Oral Daily   nicotine  7 mg Transdermal Daily   oxybutynin  5 mg Oral  BID   predniSONE  40 mg Oral Q breakfast   sodium chloride flush  3 mL Intravenous Q12H   sodium chloride flush  3 mL Intravenous Q12H   sodium chloride flush  3 mL Intravenous Q12H   spironolactone  75 mg Oral BID   Infusions:   sodium chloride     sodium chloride 1 mL/kg/hr (11/11/21 0528)   cefTRIAXone (ROCEPHIN)  IV     heparin 1,500 Units/hr (11/11/21 0016)   PRN: sodium chloride, acetaminophen **OR** acetaminophen, nitroGLYCERIN, ondansetron **OR** ondansetron (ZOFRAN) IV, polyethylene glycol, sodium chloride flush  Assessment: [name]. is a 56 y.o. adult presenting with SOB on exertion and productive cough x1 week. PMH significant for COPD, T2DM, CAD, HTN, HLD. Patient was not on [facility] PTA per chart review. Concern for possible unstable angina/small NSTEMI ISO COPDE. Planning to go for [facility] 10/31. Pharmacy has been consulted to initiate and manage heparin infusion.   Baseline Labs: aPTT 32, PT 14.4, INR 1.1, Hgb 13.0, Hct 40.0, Plt 304   Goal of Therapy:  Heparin level 0.3-0.7 units/ml Monitor platelets by anticoagulation protocol: Yes  Date Time HL Rate/Comment  10/30 1638 0.36 Therapeutic x1 10/30 2240 0.20 SUBtherapeutic 10/31 0606 0.29 SUBtherapeutic   Plan:  Give heparin bolus of 1500 units x 1 Increase heparin infusion to 1700 units/hr Check HL in 6 hrs after rate change Continue to monitor H&H and platelets daily while on heparin  Thank you for allowing pharmacy to be a part of  this patient's care.   Gretel Acre, PharmD PGY1 Pharmacy Resident 11/11/2021 7:04 AM

## 2021-11-11 NOTE — Progress Notes (Signed)
Initial Nutrition Assessment  DOCUMENTATION CODES:   Obesity unspecified  INTERVENTION:  Once diet resumes, recommend: Ensure Enlive po BID, each supplement provides 350 kcal and 20 grams of protein. 30 ml ProSource Plus BID, each supplement provides 100 kcals and 15 grams protein.  Continuing MVI with minerals daily  NUTRITION DIAGNOSIS:   Increased nutrient needs related to acute illness as evidenced by estimated needs.  GOAL:   Patient will meet greater than or equal to 90% of their needs  MONITOR:   PO intake, Supplement acceptance, Labs, Diet advancement, Weight trends  REASON FOR ASSESSMENT:   Malnutrition Screening Tool    ASSESSMENT:   Pt is homeless, admitted with SOB r/t COPD with acute exacerbation. PMH significant for COPD, T2DM, CAD, HTN, HLD. Food allergies: shellfish  Pt off unit for LHC and PCI.   Unable to obtain detailed nutrition related history at this time. Will gather more information at follow up.   Meal completions: 10/30: 100% dinner  Reviewed weight history. It appears she has had a weight loss of 13.6% between 08/14-10/30 which is clinically significant for time frame.   Edema: non-pitting BLE  Given significant weight loss, pt would likely benefit from addition of nutrition supplements once diet advances and will reassess at follow up.   Medications: zithromax, pepcid, SSI 0-15 units TID, MVI, prednisone, IV abx  Labs: BUN 23, HgbA1c 5.6% (08/14), CBG's 96-157 x24 hours  UOP: 781m x24 hours + 5082mx12 hours I/O's: +32279mince admission  NUTRITION - FOCUSED PHYSICAL EXAM: Pt off unit. Deferred to follow up.   Diet Order:   Diet Order             Diet NPO time specified Except for: Sips with Meds  Diet effective midnight                   EDUCATION NEEDS:   No education needs have been identified at this time  Skin:  Skin Assessment: Reviewed RN Assessment  Last BM:  10/30  Height:   Ht Readings from Last 1  Encounters:  11/09/21 5' 7.99" (1.727 m)    Weight:   Wt Readings from Last 1 Encounters:  11/10/21 115.3 kg    Ideal Body Weight:  70 kg  BMI:  Body mass index is 38.66 kg/m.  Estimated Nutritional Needs:   Kcal:  1800-2000  Protein:  100-115g  Fluid:  >/=1.8L  AllClayborne DanaDN, LDN Clinical Nutrition

## 2021-11-12 ENCOUNTER — Encounter: Payer: Self-pay | Admitting: Internal Medicine

## 2021-11-12 ENCOUNTER — Other Ambulatory Visit (HOSPITAL_COMMUNITY): Payer: Self-pay

## 2021-11-12 ENCOUNTER — Telehealth (HOSPITAL_COMMUNITY): Payer: Self-pay

## 2021-11-12 LAB — GLUCOSE, CAPILLARY
Glucose-Capillary: 138 mg/dL — ABNORMAL HIGH (ref 70–99)
Glucose-Capillary: 109 mg/dL — ABNORMAL HIGH (ref 70–99)
Glucose-Capillary: 105 mg/dL — ABNORMAL HIGH (ref 70–99)
Glucose-Capillary: 98 mg/dL (ref 70–99)
Glucose-Capillary: 112 mg/dL — ABNORMAL HIGH (ref 70–99)
Glucose-Capillary: 213 mg/dL — ABNORMAL HIGH (ref 70–99)

## 2021-11-12 LAB — CBC
HCT: 40.7 % (ref 39.0–52.0)
Hemoglobin: 13 g/dL (ref 13.0–17.0)
MCH: 26.2 pg (ref 26.0–34.0)
MCHC: 31.9 g/dL (ref 30.0–36.0)
MCV: 81.9 fL (ref 80.0–100.0)
Platelets: 335 10*3/uL (ref 150–400)
RBC: 4.97 MIL/uL (ref 4.22–5.81)
RDW: 15 % (ref 11.5–15.5)
WBC: 13.5 10*3/uL — ABNORMAL HIGH (ref 4.0–10.5)
nRBC: 0 % (ref 0.0–0.2)

## 2021-11-12 LAB — BASIC METABOLIC PANEL
Anion gap: 8 (ref 5–15)
BUN: 18 mg/dL (ref 6–20)
CO2: 26 mmol/L (ref 22–32)
Calcium: 9.4 mg/dL (ref 8.9–10.3)
Chloride: 103 mmol/L (ref 98–111)
Creatinine, Ser: 1.01 mg/dL (ref 0.61–1.24)
GFR, Estimated: >60 mL/min (ref >60)
Glucose, Bld: 106 mg/dL — ABNORMAL HIGH (ref 70–99)
Potassium: 3.9 mmol/L (ref 3.5–5.1)
Sodium: 137 mmol/L (ref 135–145)

## 2021-11-12 LAB — LIPID PANEL
Cholesterol: 223 mg/dL — ABNORMAL HIGH (ref 0–200)
HDL: 34 mg/dL — ABNORMAL LOW (ref >40)
LDL Cholesterol: 150 mg/dL — ABNORMAL HIGH (ref 0–99)
Total CHOL/HDL Ratio: 6.6 RATIO
Triglycerides: 197 mg/dL — ABNORMAL HIGH (ref <150)
VLDL: 39 mg/dL (ref 0–40)

## 2021-11-12 MED ORDER — ENOXAPARIN SODIUM 60 MG/0.6ML IJ SOSY
0.5000 mg/kg | PREFILLED_SYRINGE | INTRAMUSCULAR | Status: DC
  Administered 2021-11-13 – 2021-11-14 (×2): 57.5 mg via SUBCUTANEOUS
  Filled 2021-11-12 (×2): qty 0.6

## 2021-11-12 NOTE — Progress Notes (Signed)
Nutrition Follow-up  DOCUMENTATION CODES:   Obesity unspecified  INTERVENTION:   -D/c Ensure Enlive -D/c Prosource Plus -MVI with minerals daily -Double protein with meals  NUTRITION DIAGNOSIS:   Increased nutrient needs related to chronic illness (COPD) as evidenced by estimated needs.  Ongoing  GOAL:   Patient will meet greater than or equal to 90% of their needs  Progressing   MONITOR:   PO intake, Supplement acceptance  REASON FOR ASSESSMENT:   Malnutrition Screening Tool    ASSESSMENT:   Pt is homeless, admitted with SOB r/t COPD with acute exacerbation. PMH significant for COPD, T2DM, CAD, HTN, HLD. Food allergies: shellfish  10/31- s/p cath- revealed thrombotic lesion in the mid RCA suggestive of acute plaque rupture.  This was treated with DES x1.  Reviewed I/O's: -807 ml x 24 hours and +15 ml since admission  UOP: 1.1 L x 24 hours   Pt sitting in recliner chair at time of visit and sister also present in the room. Pt prefers to be addressed as "[name]". She reports that she had erratic intake due to being homeless- she explains that she only receives $23 in food stamps per month and supplements diet with meals from [employer] and [employer]. Intake is erratic secondary to social situation.   Pt endorses weight loss over the past 3 months, which she attributes to food insecurity (she currently is living in her car). Reviewed wt hx; pt has experienced a 14.7% wt loss over the past 6 months, which is significant for time frame. Pt also with edema, which mya be masking true weight loss as well as fat and muscle depletions.  Discussed importance of good meal intake to promote healing. Pt reports feeling hungry even after meals and is amenable to double protein portions.   Medications reviewed and include aldactone.   Lab Results  Component Value Date   HGBA1C 5.6 08/25/2021   PTA DM medications are .   Labs reviewed: CBGS: 98-138 (inpatient orders for  glycemic control are 0-15 units insulin aspart TID with meals).    NUTRITION - FOCUSED PHYSICAL EXAM:  Flowsheet Row Most Recent Value  Orbital Region No depletion  Upper Arm Region No depletion  Thoracic and Lumbar Region No depletion  Buccal Region No depletion  Temple Region No depletion  Clavicle Bone Region No depletion  Clavicle and Acromion Bone Region No depletion  Scapular Bone Region No depletion  Dorsal Hand No depletion  Patellar Region No depletion  Anterior Thigh Region No depletion  Posterior Calf Region No depletion  Edema (RD Assessment) Mild  Hair Reviewed  Eyes Reviewed  Mouth Reviewed  Skin Reviewed  Nails Reviewed       Diet Order:   Diet Order             Diet Carb Modified Fluid consistency: Thin; Room service appropriate? Yes  Diet effective now                   EDUCATION NEEDS:   Education needs have been addressed  Skin:  Skin Assessment: Reviewed RN Assessment  Last BM:  11/10/21  Height:   Ht Readings from Last 1 Encounters:  11/09/21 5' 7.99" (1.727 m)    Weight:   Wt Readings from Last 1 Encounters:  11/10/21 115.3 kg   BMI:  Body mass index is 38.66 kg/m.  Estimated Nutritional Needs:   Kcal:  2000-2200  Protein:  100-115 grams  Fluid:  > 2 L  Loistine Chance, RD, LDN, [place] Registered Dietitian II Certified Diabetes Care and Education Specialist Please refer to [facility] for RD and/or RD on-call/weekend/after hours pager

## 2021-11-12 NOTE — Evaluation (Signed)
Physical Therapy Evaluation Patient Details Name: [name]. MRN: [id number] DOB: [birth year] Today's Date: 11/12/2021  History of Present Illness  Pt is a 56 yo adult with medical history significant of COPD, type 2 diabetes, CAD, hypertension, hyperlipidemia who presents to the ED with complaints of shortness of breath x1wk.  MD assessment includes: NSTEMI, COPD with acute exacerbation, and stable angina.   Clinical Impression  Pt was pleasant and motivated to participate during the session and put forth good effort throughout. Pt demonstrated very good control and stability with transfers and gait without an AD.  Pt was able to amb 1 x 40 feet on 2LO2/min and 1 x 40 feet on 1LO2/min with SpO2 remaining 96-97% throughout on each, nursing notified.  Pt reported feeling very close to baseline functionally other than min decreased activity tolerance.  Will keep patient on acute care PT caseload to help minimize further deconditioning but no skilled PT recommended upon discharge.          Recommendations for follow up therapy are one component of a multi-disciplinary discharge planning process, led by the attending physician.  Recommendations may be updated based on patient status, additional functional criteria and insurance authorization.  Follow Up Recommendations No PT follow up      Assistance Recommended at Discharge None  Patient can return home with the following  Assist for transportation    Equipment Recommendations None recommended by PT  Recommendations for Other Services       Functional Status Assessment Patient has had a recent decline in their functional status and demonstrates the ability to make significant improvements in function in a reasonable and predictable amount of time.     Precautions / Restrictions Precautions Precautions: None Restrictions Weight Bearing Restrictions: No      Mobility  Bed Mobility               General bed mobility  comments: received and left sitting in recliner    Transfers Overall transfer level: Independent Equipment used: None               General transfer comment: Good eccentric and concentric control and stability    Ambulation/Gait Ambulation/Gait assistance: Supervision Gait Distance (Feet): 40 Feet x 2 Assistive device: None Gait Pattern/deviations: Step-through pattern, Decreased step length - right, Decreased step length - left Gait velocity: decreased     General Gait Details: Min reduced cadence but steady including during 180 deg turns and navigating tight spaces  Stairs            Wheelchair Mobility    Modified Rankin (Stroke Patients Only)       Balance Overall balance assessment: No apparent balance deficits (not formally assessed)                                           Pertinent Vitals/Pain Pain Assessment Pain Assessment: No/denies pain    Home Living Family/patient expects to be discharged to:: Unsure                   Additional Comments: Pt lives in her car    Prior Function Prior Level of Function : Independent/Modified Independent;Driving             Mobility Comments: Ind amb without an AD community distances ADLs Comments: Ind with ADLs     Hand Dominance  Dominant Hand: Right    Extremity/Trunk Assessment   Upper Extremity Assessment Upper Extremity Assessment: Overall WFL for tasks assessed    Lower Extremity Assessment Lower Extremity Assessment: Overall WFL for tasks assessed       Communication   Communication: No difficulties  Cognition Arousal/Alertness: Awake/alert Behavior During Therapy: WFL for tasks assessed/performed Overall Cognitive Status: Within Functional Limits for tasks assessed                                          General Comments      Exercises     Assessment/Plan    PT Assessment Patient needs continued PT services  PT Problem List  Decreased activity tolerance       PT Treatment Interventions Gait training;Functional mobility training;Therapeutic activities;Therapeutic exercise;Patient/family education    PT Goals (Current goals can be found in the Care Plan section)  Acute Rehab PT Goals Patient Stated Goal: Improved endurance PT Goal Formulation: With patient Time For Goal Achievement: 11/25/21 Potential to Achieve Goals: Good    Frequency Min 2X/week     Co-evaluation               AM-PAC PT "6 Clicks" Mobility  Outcome Measure Help needed turning from your back to your side while in a flat bed without using bedrails?: None Help needed moving from lying on your back to sitting on the side of a flat bed without using bedrails?: None Help needed moving to and from a bed to a chair (including a wheelchair)?: None Help needed standing up from a chair using your arms (e.g., wheelchair or bedside chair)?: None Help needed to walk in hospital room?: A Little Help needed climbing 3-5 steps with a railing? : A Little 6 Click Score: 22    End of Session Equipment Utilized During Treatment: Gait belt Activity Tolerance: Patient tolerated treatment well Patient left: in chair;with family/visitor present;with call bell/phone within reach Nurse Communication: Mobility status;Other (comment) (SpO2 with amb per above) PT Visit Diagnosis: Difficulty in walking, not elsewhere classified (R26.2)    Time: 1610-9604 PT Time Calculation (min) (ACUTE ONLY): 34 min   Charges:   PT Evaluation $PT Eval Moderate Complexity: 1 Mod PT Treatments $Therapeutic Activity: 8-22 mins       D. Royetta Asal PT, DPT 11/12/21, 5:46 PM

## 2021-11-12 NOTE — Evaluation (Addendum)
Occupational Therapy Evaluation Patient Details Name: [name]. MRN: [id number] DOB: 01-27-1965 Today's Date: 11/12/2021   History of Present Illness 56 y.o. adult with medical history significant of COPD, type 2 diabetes, CAD, hypertension, hyperlipidemia who presents to the ED with complaints of shortness of breath x1wk.   Clinical Impression   Pt was seen for OT evaluation this date. Prior to hospital admission, pt was IND for mobility and ADLs, reports living in her car. Pt presents to acute OT demonstrating impaired ADL performance and functional mobility 2/2 decreased activity tolerance. Pt currently requires SUPERVISION for ADL t/f,  no AD for ~20 ft x 2, requires seated rest break. SpO2 mid 90s on 2L [place] at rest and during mobility, trialed1L [place] with SpO2 maintained in 90s howver increased SOB at rest, resolved with 2L. Pt educated on HEP and IS provided. Pt would benefit from skilled OT for ECS and to improve activity tolerance. Upon hospital discharge, recommend no OT follow up.   Recommendations for follow up therapy are one component of a multi-disciplinary discharge planning process, led by the attending physician.  Recommendations may be updated based on patient status, additional functional criteria and insurance authorization.   Follow Up Recommendations  No OT follow up    Assistance Recommended at Discharge PRN  Patient can return home with the following Help with stairs or ramp for entrance    Functional Status Assessment  Patient has had a recent decline in their functional status and demonstrates the ability to make significant improvements in function in a reasonable and predictable amount of time.  Equipment Recommendations  None recommended by OT    Recommendations for Other Services       Precautions / Restrictions Precautions Precautions: None Restrictions Weight Bearing Restrictions: No Other Position/Activity Restrictions: RUE elevated and  treated as NWB for x24hours after cardiac cath on 10/31      Mobility Bed Mobility               General bed mobility comments: received and left sitting    Transfers Overall transfer level: Needs assistance Equipment used: None Transfers: Sit to/from Stand Sit to Stand: Supervision                  Balance Overall balance assessment: Needs assistance Sitting-balance support: No upper extremity supported, Feet supported Sitting balance-Leahy Scale: Normal     Standing balance support: No upper extremity supported, During functional activity Standing balance-Leahy Scale: Fair Standing balance comment: minor LOBs with high level balance                           ADL either performed or assessed with clinical judgement   ADL Overall ADL's : Needs assistance/impaired                                       General ADL Comments: SUPERVISION for ADL t/f, requires seated rest break ~20 ft x 2. MOD I for LB access in sitting.       Pertinent Vitals/Pain Pain Assessment Pain Assessment: Faces Faces Pain Scale: Hurts a little bit Pain Location: chest Pain Descriptors / Indicators: Dull Pain Intervention(s): Limited activity within patient's tolerance, Repositioned     Hand Dominance Right   Extremity/Trunk Assessment Upper Extremity Assessment Upper Extremity Assessment: Overall WFL for tasks assessed   Lower Extremity Assessment Lower  Extremity Assessment: Overall WFL for tasks assessed       Communication Communication Communication: No difficulties   Cognition Arousal/Alertness: Awake/alert Behavior During Therapy: WFL for tasks assessed/performed Overall Cognitive Status: Within Functional Limits for tasks assessed                                       General Comments  SPO2 90s on 2L [place] and 1L [place] howver increased SOB on 1L            Home Living Family/patient expects to be discharged to:: Private  residence Living Arrangements:  (sister)                               Additional Comments: Pt lives in her car      Prior Functioning/Environment Prior Level of Function : Independent/Modified Independent;Driving                        OT Problem List: Decreased strength;Decreased range of motion;Decreased activity tolerance;Impaired balance (sitting and/or standing);Decreased safety awareness      OT Treatment/Interventions: Self-care/ADL training;Therapeutic exercise;Energy conservation;DME and/or AE instruction;Therapeutic activities;Patient/family education;Balance training    OT Goals(Current goals can be found in the care plan section) Acute Rehab OT Goals Patient Stated Goal: to improve breathing OT Goal Formulation: With patient Time For Goal Achievement: 11/26/21 Potential to Achieve Goals: Good  OT Frequency: Min 2X/week    Co-evaluation              AM-PAC OT "6 Clicks" Daily Activity     Outcome Measure Help from another person eating meals?: None Help from another person taking care of personal grooming?: A Little Help from another person toileting, which includes using toliet, bedpan, or urinal?: A Little Help from another person bathing (including washing, rinsing, drying)?: A Little Help from another person to put on and taking off regular upper body clothing?: None Help from another person to put on and taking off regular lower body clothing?: None 6 Click Score: 21   End of Session    Activity Tolerance: Patient tolerated treatment well Patient left: in chair;with call bell/phone within reach;with family/visitor present  OT Visit Diagnosis: Muscle weakness (generalized) (M62.81)                Time: 0383-3383 OT Time Calculation (min): 28 min Charges:  OT General Charges $OT Visit: 1 Visit OT Evaluation $OT Eval Moderate Complexity: 1 Mod OT Treatments $Self Care/Home Management : 8-22 mins  Dessie Coma, M.S. OTR/L   11/12/21, 12:26 PM  ascom [phone]

## 2021-11-12 NOTE — Telephone Encounter (Signed)
Pharmacy Patient Advocate Encounter  Insurance verification completed.    The patient is insured through [place]   The patient is currently admitted and ran test claims for the following: Prasugrel.  Copays and coinsurance results were relayed to Inpatient clinical team.

## 2021-11-12 NOTE — Progress Notes (Signed)
PHARMACIST - PHYSICIAN COMMUNICATION  CONCERNING:  Enoxaparin (Lovenox) for DVT Prophylaxis    RECOMMENDATION: Patient was prescribed enoxaprin '40mg'$  q24 hours for VTE prophylaxis.   Filed Weights   11/09/21 1710 11/10/21 2109  Weight: 128 kg (282 lb 3 oz) 115.3 kg (254 lb 3.1 oz)    Body mass index is 38.66 kg/m.  Estimated Creatinine Clearance (by C-G formula based on SCr of 1.01 mg/dL) Male: 83 mL/min Male: 100.7 mL/min   Based on [employer] policy patient is candidate for enoxaparin 0.'5mg'$ /kg TBW SQ every 24 hours based on BMI being >30.   DESCRIPTION: Pharmacy has adjusted enoxaparin dose per [facility] policy.  Patient is now receiving enoxaparin 57.5 mg every 24 hours   Gretel Acre, PharmD PGY1 Pharmacy Resident 11/12/2021 11:21 AM

## 2021-11-12 NOTE — Progress Notes (Signed)
Rounding Note    Patient Name: [name]. Date of Encounter: 11/12/2021  [place] Cardiologist: Kate Sable, MD   Subjective   Denies chest pain or shortness of breath, no acute events overnight.  Inpatient Medications    Scheduled Meds:  (feeding supplement) PROSource Plus  30 mL Oral BID BM   ARIPiprazole  5 mg Oral Daily   aspirin EC  81 mg Oral Daily   atorvastatin  40 mg Oral QPM   azithromycin  500 mg Oral Daily   [START ON 11/13/2021] enoxaparin (LOVENOX) injection  0.5 mg/kg Subcutaneous Q24H   escitalopram  20 mg Oral Daily   estradiol  2 mg Oral BID   famotidine  40 mg Oral Daily   feeding supplement  237 mL Oral BID BM   fluticasone furoate-vilanterol  1 puff Inhalation Daily   And   umeclidinium bromide  1 puff Inhalation Daily   gabapentin  600 mg Oral TID   insulin aspart  0-15 Units Subcutaneous TID WC   ipratropium-albuterol  3 mL Nebulization BID   lidocaine  1 patch Transdermal Q24H   multivitamin with minerals  1 tablet Oral Daily   nicotine  7 mg Transdermal Daily   oxybutynin  5 mg Oral BID   prasugrel  10 mg Oral Daily   predniSONE  40 mg Oral Q breakfast   sodium chloride flush  3 mL Intravenous Q12H   sodium chloride flush  3 mL Intravenous Q12H   sodium chloride flush  3 mL Intravenous Q12H   spironolactone  75 mg Oral BID   Continuous Infusions:  sodium chloride     cefTRIAXone (ROCEPHIN)  IV Stopped (11/11/21 1622)   PRN Meds: sodium chloride, acetaminophen **OR** acetaminophen, nitroGLYCERIN, ondansetron **OR** ondansetron (ZOFRAN) IV, polyethylene glycol, sodium chloride flush   Vital Signs    Vitals:   11/11/21 2314 11/12/21 0533 11/12/21 0845 11/12/21 1220  BP: 125/67 (!) 140/79 125/76 123/74  Pulse: 89 82 (!) 102 100  Resp:  '17 18 16  '$ Temp:  97.6 F (36.4 C) 98.2 F (36.8 C) 97.7 F (36.5 C)  TempSrc:      SpO2: 97% 98% 98% 96%  Weight:      Height:        Intake/Output Summary (Last 24  hours) at 11/12/2021 1423 Last data filed at 11/12/2021 0900 Gross per 24 hour  Intake 243 ml  Output 850 ml  Net -607 ml      11/10/2021    9:09 PM 11/09/2021    5:10 PM 08/25/2021   11:38 AM  Last 3 Weights  Weight (lbs) 254 lb 3.1 oz 282 lb 3 oz 294 lb  Weight (kg) 115.3 kg 128 kg 133.358 kg      Telemetry    Normal sinus rhythm/sinus tach- Personally Reviewed  ECG     - Personally Reviewed  Physical Exam   GEN: No acute distress.   Neck: No JVD Cardiac: RRR, no murmurs, rubs, or gallops.  Respiratory: Decreased breath sounds bilaterally, no edema GI: Soft, nontender, non-distended  MS: No edema; No deformity. Neuro:  Nonfocal  Psych: Normal affect   Labs    High Sensitivity Troponin:   Recent Labs  Lab 11/09/21 1230 11/09/21 1550 11/10/21 0024 11/10/21 0325 11/10/21 0548  TROPONINIHS 12 17 20* 126* 229*     Chemistry Recent Labs  Lab 11/09/21 1230 11/10/21 0025 11/11/21 0606 11/12/21 0802  NA 134* 133* 137 137  K 3.9  4.8 4.0 3.9  CL 97* 99 102 103  CO2 '25 24 27 26  '$ GLUCOSE 126* 177* 105* 106*  BUN 17 20 23* 18  CREATININE 1.28* 1.36* 1.18 1.01  CALCIUM 9.1 9.2 8.9 9.4  PROT 8.0  --   --   --   ALBUMIN 3.5  --   --   --   AST 20  --   --   --   ALT 18  --   --   --   ALKPHOS 69  --   --   --   BILITOT 0.8  --   --   --   GFRNONAA >60 >60 >60 >60  ANIONGAP '12 10 8 8    '$ Lipids  Recent Labs  Lab 11/12/21 0802  CHOL 223*  TRIG 197*  HDL 34*  LDLCALC 150*  CHOLHDL 6.6    Hematology Recent Labs  Lab 11/10/21 0325 11/11/21 0606 11/12/21 0802  WBC 8.5 13.8* 13.5*  RBC 4.92 4.44 4.97  HGB 13.0 11.8* 13.0  HCT 40.0 37.3* 40.7  MCV 81.3 84.0 81.9  MCH 26.4 26.6 26.2  MCHC 32.5 31.6 31.9  RDW 15.1 15.0 15.0  PLT 304 338 335   Thyroid No results for input(s): "TSH", "FREET4" in the last 168 hours.  BNP Recent Labs  Lab 11/09/21 1230  BNP 44.9    DDimer No results for input(s): "DDIMER" in the last 168 hours.   Radiology     CARDIAC CATHETERIZATION  Result Date: 11/11/2021 Conclusions: Severe single-vessel coronary artery disease with thrombotic-appearing 80-90% mid RCA stenosis followed by irregular segment of up to 60-70% stenosis in the mid/distal RCA.  There is mild-moderate, nonobstructive coronary artery disease involving the LAD, OM branches, and proximal RCA. Mildly elevated left heart filling pressure (LVEDP 15-20 mmHg with significant respiratory variation). Successful PCI to mid/distal RCA using Onyx Frontier 3.0 x 38 mm drug-eluting stent (post-dilated to 3.3 mm) with 0% residual stenosis and TIMI-3 flow. Recommendations: Dual antiplatelet therapy with aspirin and prasugrel for at least 12 months. Aggressive secondary prevention of coronary artery disease. Nelva Bush, MD [facility]   Cardiac Studies   Echo 11/10/2021 EF 65 to 70%  Patient Profile     56 y.o. adult with history of hypertension, hyperlipidemia, diabetes, COPD who presents with shortness of breath, admitted with COPD exacerbation being seen for elevated troponin/NSTEMI.  Assessment & Plan    NSTEMI -S/p drug-eluting stent to mid RCA -Denies chest pain -Continue aspirin, Effient, Lipitor 40 mg daily  2.  Hypertension -BP controlled -On Aldactone  3.  COPD, exacerbation -Management as per primary team  No additional cardiac testing or intervention planned.  Continue medications as above.  Close follow-up as outpatient upon discharge advised.  Cardiology will sign off.  Total encounter time more than 50 minutes  Greater than 50% was spent in counseling and coordination of care with the patient   Signed, Kate Sable, MD  11/12/2021, 2:23 PM

## 2021-11-12 NOTE — Progress Notes (Signed)
Consultation Progress Note   Patient: [name]. DZH:[id number] DOB: March 17, 1965 DOA: 11/09/2021 DOS: the patient was seen and examined on 11/12/2021 Primary service: Aayra Hornbaker, Manfred Shirts, MD  Brief hospital course: This 56 years old male with PMH significant for COPD, type 2 diabetes, CAD, hypertension, hyperlipidemia presented in the ED with complaints of worsening shortness of breath.  She has developed shortness of breath and productive cough for about a week.  She has been using her regular inhalers more frequently without any improvement.  Patient also reports chest pain occurring with cough.  She also reports chest pain when she exerts herself but this has been ongoing for more than a month.  She was hypoxic with SPO2 of 83% on room air placed on 4 L of supplemental oxygen.  CT chest ruled out PE however shows interval development of diffuse tree-in-bud nodularity involving all lobes of both lungs.  Patient is admitted for acute hypoxic respiratory failure likely secondary to COPD exacerbation.   Assessment and Plan: NSTEMI  Status post LHC--> showed thrombotic lesion in the mid RCA suggestive of acute plaque rupture.  This was treated with DES x1.  Otherwise, nonobstructive disease was observed. -Continue dual antiplatelet therapy with aspirin and prasugrel for 12 months. -Aggressive secondary prevention with high intensity statin therapy.   * COPD with acute exacerbation [facility]) Patient presenting with 1 week of increased dyspnea, cough and sputum production requiring increased use of home inhalers.  Initially able to tolerate supplemental oxygen via nasal cannula only, however due to increased work of breathing when oxygen was discontinued, patient has been placed on BiPAP and tolerating well.  CT imaging notable for tree-in-bud nodularity concerning for infectious bronchiolitis.  Given history of COPD and active smoking, will obtain sputum cultures.  -Continue supplemental oxygen as  needed to maintain oxygen saturation above 88% - Continue BiPAP - Wean both as tolerated - DuoNebs every 6 hours - S/p Solu-Medrol 125 mg.  Start prednisone 40 mg daily tomorrow to complete a 5-day course - Given severity of exacerbation, will continue CAP coverage with ceftriaxone and azithromycin.  Can likely discontinue once clinically improved - Sputum cultures pending  Stable angina Ms. Nash Mantis endorses at least 1 month history of chest pain with exertion that does not occur at rest.  Per chart review, normal stress test in 2021.  Given this is a new onset, will obtain an echocardiogram, however I have counseled Ms. Lecomte that she will need to establish with cardiology.  - Echocardiogram pending  Essential hypertension - Restart home antihypertensives  DM2 (diabetes mellitus, type 2) (HCC) - SSI, moderate  Depression - Restart home Lexapro, Wellbutrin and Abilify  Neurogenic bladder - Restart home oxybutynin        TRH will continue to follow the patient.  Subjective: Still short of breath, needing 2 L of oxygen.  Physical Exam: General exam: Appears comfortable, not in any acute distress, deconditioned Respiratory system: Decreased breath sounds, respiratory effort normal, no accessory muscle use, RR 15 Cardiovascular system: S1 & S2 heard, regular rhythm, no murmur. Gastrointestinal system: Abdomen is soft, non tender, non distended, BS+ Central nervous system: Alert and oriented X 3. No focal neurological deficits. Extremities: No edema, no cyanosis, no clubbing Skin: No rashes, lesions or ulcers Psychiatry: Judgement and insight appear normal. Mood & affect appropriate.  Vitals:   11/11/21 2140 11/11/21 2314 11/12/21 0533 11/12/21 0845  BP: 115/74 125/67 (!) 140/79 125/76  Pulse: 96 89 82 (!) 102  Resp: 18  17 18  Temp: 99.4 F (37.4 C)  97.6 F (36.4 C) 98.2 F (36.8 C)  TempSrc:      SpO2: 96% 97% 98% 98%  Weight:      Height:        Data  Reviewed:  There are no new results to review at this time.  Family Communication:   Time spent: 15 minutes.  Author: Cristela Felt, MD 11/12/2021 12:11 PM  For on call review [email or web address].

## 2021-11-12 NOTE — TOC Benefit Eligibility Note (Signed)
Patient Teacher, English as a foreign language completed.    The patient is currently admitted and upon discharge could be taking Prasugrel 10 mg.  The current 30 day co-pay is $0.00.   The patient is insured through [employer], [place] Patient Advocate Specialist [place] Patient Advocate Team Direct Number: [phone] Fax: [phone]

## 2021-11-13 ENCOUNTER — Telehealth: Payer: Self-pay

## 2021-11-13 LAB — GLUCOSE, CAPILLARY
Glucose-Capillary: 112 mg/dL — ABNORMAL HIGH (ref 70–99)
Glucose-Capillary: 132 mg/dL — ABNORMAL HIGH (ref 70–99)
Glucose-Capillary: 130 mg/dL — ABNORMAL HIGH (ref 70–99)
Glucose-Capillary: 153 mg/dL — ABNORMAL HIGH (ref 70–99)

## 2021-11-13 LAB — CULTURE, RESPIRATORY W GRAM STAIN
Culture: NORMAL
Gram Stain: NONE SEEN

## 2021-11-13 LAB — TROPONIN I (HIGH SENSITIVITY): Troponin I (High Sensitivity): 62 ng/L — ABNORMAL HIGH (ref <18)

## 2021-11-13 LAB — LIPOPROTEIN A (LPA): Lipoprotein (a): 38.7 nmol/L — ABNORMAL HIGH (ref <75.0)

## 2021-11-13 LAB — MAGNESIUM: Magnesium: 1.8 mg/dL (ref 1.7–2.4)

## 2021-11-13 MED ORDER — EZETIMIBE 10 MG PO TABS
10.0000 mg | ORAL_TABLET | Freq: Every day | ORAL | Status: DC
  Administered 2021-11-13: 10 mg via ORAL
  Filled 2021-11-13: qty 1

## 2021-11-13 MED ORDER — ATORVASTATIN CALCIUM 80 MG PO TABS
80.0000 mg | ORAL_TABLET | Freq: Every evening | ORAL | Status: DC
  Administered 2021-11-13: 80 mg via ORAL
  Filled 2021-11-13: qty 1

## 2021-11-13 NOTE — Telephone Encounter (Signed)
Call to patient to schedule additional sputum collections and QFT at the [facility].  No answer. LM on VM with call back number. Servando Salina, RN

## 2021-11-13 NOTE — Progress Notes (Signed)
PROGRESS NOTE    [name].  WUJ:[id number] DOB: Mar 13, 1965 DOA: 11/09/2021 PCP: Howard Pouch, NP    Brief Narrative:  This 56 years old male with PMH significant for COPD, type 2 diabetes, CAD, hypertension, hyperlipidemia presented in the ED with complaints of worsening shortness of breath.  She has developed shortness of breath and productive cough for about a week.  She has been using her regular inhalers more frequently without any improvement.  Patient also reports chest pain occurring with cough.  She also reports chest pain when she exerts herself but this has been ongoing for more than a month.  She was hypoxic with SPO2 of 83% on room air placed on 4 L of supplemental oxygen.  CT chest ruled out PE however shows interval development of diffuse tree-in-bud nodularity involving all lobes of both lungs.  Patient is admitted for acute hypoxic respiratory failure likely secondary to COPD exacerbation.    Assessment & Plan:   Principal Problem:   COPD with acute exacerbation ([place]) Active Problems:   Stable angina   Essential hypertension   DM2 (diabetes mellitus, type 2) (HCC)   Neurogenic bladder   Depression   Non-ST elevation (NSTEMI) myocardial infarction [facility])   NSTEMI   Status post LHC--> showed thrombotic lesion in the mid RCA suggestive of acute plaque rupture.  This was treated with DES x1.  Otherwise, nonobstructive disease was observed. -Continue dual antiplatelet therapy with aspirin and prasugrel for 12 months. -Aggressive secondary prevention with high intensity statin therapy.   * COPD with acute exacerbation [facility]) Patient presenting with 1 week of increased dyspnea, cough and sputum production requiring increased use of home inhalers.  Initially able to tolerate supplemental oxygen via nasal cannula only, however due to increased work of breathing when oxygen was discontinued, patient has been placed on BiPAP and tolerating well.  CT imaging notable  for tree-in-bud nodularity concerning for infectious bronchiolitis.  Given history of COPD and active smoking, will obtain sputum cultures. Plan: PO prednisone Duonebs Wean oxygen Check sputum for afb DC abx after todays dose  Stable angina [name] endorses at least 1 month history of chest pain with exertion that does not occur at rest.  Per chart review, normal stress test in 2021.  Given this is a new onset, will obtain an echocardiogram, however I have counseled [name] that she will need to establish with cardiology.     Essential hypertension - Restart home antihypertensives   DM2 (diabetes mellitus, type 2) (HCC) - SSI, moderate   Depression - Restart home Lexapro, Wellbutrin and Abilify   Neurogenic bladder - Restart home oxybutynin  Morbid obesity Meets criteria with BMI 38 and chronic comorbidities Complicates care and prognosis   DVT prophylaxis: SQ lovenox Code Status: FULL Family Communication:Sister at bedside 11/2 Disposition Plan: Status is: Inpatient Remains inpatient appropriate because: COPD flare. Anticipate dc tomo   Level of care: Progressive  Consultants:  None  Procedures:  Cath with PCI and DES  Antimicrobials:   Subjective: Seen and examined.  SOB improving  Objective: Vitals:   11/13/21 0808 11/13/21 1209 11/13/21 1430 11/13/21 1648  BP:  124/82  131/78  Pulse:  95  (!) 109  Resp:  '16 17 16  '$ Temp:  97.9 F (36.6 C)  97.8 F (36.6 C)  TempSrc:      SpO2: 98% 97% 94% 94%  Weight:      Height:        Intake/Output Summary (Last 24  hours) at 11/13/2021 1723 Last data filed at 11/13/2021 6712 Gross per 24 hour  Intake 435.15 ml  Output 800 ml  Net -364.85 ml   Filed Weights   11/09/21 1710 11/10/21 2109  Weight: 128 kg 115.3 kg    Examination:  General exam: NAD Respiratory system: Mild end exp wheeze, normal WOB, 2L Cardiovascular system: S1S2. No murmur Gastrointestinal system: Obese, soft NTND Central  nervous system: Alert and oriented. No focal neurological deficits. Extremities: Symmetric 5 x 5 power. Skin: No rashes, lesions or ulcers Psychiatry: Judgement and insight appear normal. Mood & affect appropriate.     Data Reviewed: I have personally reviewed following labs and imaging studies  CBC: Recent Labs  Lab 11/09/21 1230 11/10/21 0325 11/11/21 0606 11/12/21 0802  WBC 7.9 8.5 13.8* 13.5*  HGB 12.8* 13.0 11.8* 13.0  HCT 39.1 40.0 37.3* 40.7  MCV 80.5 81.3 84.0 81.9  PLT 309 304 338 458   Basic Metabolic Panel: Recent Labs  Lab 11/09/21 1230 11/10/21 0025 11/11/21 0606 11/12/21 0802  NA 134* 133* 137 137  K 3.9 4.8 4.0 3.9  CL 97* 99 102 103  CO2 '25 24 27 26  '$ GLUCOSE 126* 177* 105* 106*  BUN 17 20 23* 18  CREATININE 1.28* 1.36* 1.18 1.01  CALCIUM 9.1 9.2 8.9 9.4   GFR: Estimated Creatinine Clearance (by C-G formula based on SCr of 1.01 mg/dL) Male: 83 mL/min Male: 100.7 mL/min Liver Function Tests: Recent Labs  Lab 11/09/21 1230  AST 20  ALT 18  ALKPHOS 69  BILITOT 0.8  PROT 8.0  ALBUMIN 3.5   Recent Labs  Lab 11/09/21 1230  LIPASE 30   No results for input(s): "AMMONIA" in the last 168 hours. Coagulation Profile: Recent Labs  Lab 11/10/21 0633  INR 1.1   Cardiac Enzymes: No results for input(s): "CKTOTAL", "CKMB", "CKMBINDEX", "TROPONINI" in the last 168 hours. BNP (last 3 results) No results for input(s): "PROBNP" in the last 8760 hours. HbA1C: No results for input(s): "HGBA1C" in the last 72 hours. CBG: Recent Labs  Lab 11/12/21 1653 11/12/21 2210 11/13/21 0801 11/13/21 1211 11/13/21 1647  GLUCAP 112* 213* 112* 132* 130*   Lipid Profile: Recent Labs    11/12/21 0802  CHOL 223*  HDL 34*  LDLCALC 150*  TRIG 197*  CHOLHDL 6.6   Thyroid Function Tests: No results for input(s): "TSH", "T4TOTAL", "FREET4", "T3FREE", "THYROIDAB" in the last 72 hours. Anemia Panel: No results for input(s): "VITAMINB12", "FOLATE",  "FERRITIN", "TIBC", "IRON", "RETICCTPCT" in the last 72 hours. Sepsis Labs: Recent Labs  Lab 11/09/21 1442  PROCALCITON <0.10    Recent Results (from the past 240 hour(s))  Resp Panel by RT-PCR (Flu A&B, Covid) Anterior Nasal Swab     Status: None   Collection Time: 11/09/21 12:30 PM   Specimen: Anterior Nasal Swab  Result Value Ref Range Status   SARS Coronavirus 2 by RT PCR NEGATIVE NEGATIVE Final    Comment: (NOTE) SARS-CoV-2 target nucleic acids are NOT DETECTED.  The SARS-CoV-2 RNA is generally detectable in upper respiratory specimens during the acute phase of infection. The lowest concentration of SARS-CoV-2 viral copies this assay can detect is 138 copies/mL. A negative result does not preclude SARS-Cov-2 infection and should not be used as the sole basis for treatment or other patient management decisions. A negative result may occur with  improper specimen collection/handling, submission of specimen other than nasopharyngeal swab, presence of viral mutation(s) within the areas targeted by this assay, and inadequate  number of viral copies(<138 copies/mL). A negative result must be combined with clinical observations, patient history, and epidemiological information. The expected result is Negative.  Fact Sheet for Patients:  EntrepreneurPulse.com.au  Fact Sheet for Healthcare Providers:  IncredibleEmployment.be  This test is no t yet approved or cleared by the [place] FDA and  has been authorized for detection and/or diagnosis of SARS-CoV-2 by FDA under an Emergency Use Authorization (EUA). This EUA will remain  in effect (meaning this test can be used) for the duration of the COVID-19 declaration under Section 564(b)(1) of the Act, 21 U.S.C.section 360bbb-3(b)(1), unless the authorization is terminated  or revoked sooner.       Influenza A by PCR NEGATIVE NEGATIVE Final   Influenza B by PCR NEGATIVE NEGATIVE Final     Comment: (NOTE) The Xpert Xpress SARS-CoV-2/FLU/RSV plus assay is intended as an aid in the diagnosis of influenza from Nasopharyngeal swab specimens and should not be used as a sole basis for treatment. Nasal washings and aspirates are unacceptable for Xpert Xpress SARS-CoV-2/FLU/RSV testing.  Fact Sheet for Patients: EntrepreneurPulse.com.au  Fact Sheet for Healthcare Providers: IncredibleEmployment.be  This test is not yet approved or cleared by the [place] FDA and has been authorized for detection and/or diagnosis of SARS-CoV-2 by FDA under an Emergency Use Authorization (EUA). This EUA will remain in effect (meaning this test can be used) for the duration of the COVID-19 declaration under Section 564(b)(1) of the Act, 21 U.S.C. section 360bbb-3(b)(1), unless the authorization is terminated or revoked.  Performed at [facility], [address] [place], [place] [address]   Expectorated Sputum Assessment w Gram Stain, Rflx to Resp Cult     Status: None   Collection Time: 11/10/21  8:30 PM   Specimen: Expectorated Sputum  Result Value Ref Range Status   Specimen Description EXPECTORATED SPUTUM  Final   Special Requests NONE  Final   Sputum evaluation   Final    THIS SPECIMEN IS ACCEPTABLE FOR SPUTUM CULTURE Performed at [facility], [address], [place], [place] [address]    Report Status 11/10/2021 FINAL  Final  Culture, Respiratory w Gram Stain     Status: None   Collection Time: 11/10/21  8:30 PM  Result Value Ref Range Status   Specimen Description   Final    EXPECTORATED SPUTUM Performed at [facility], [address], [place], [place] [address]    Special Requests   Final    NONE Reflexed from [phone] [phone] Performed at [facility], [address] [place], [place] [address]    Gram Stain NO WBC SEEN NO ORGANISMS SEEN   Final   Culture   Final    RARE Normal respiratory flora-no  Staph aureus or Pseudomonas seen Performed at [facility], [place] [address], [place], [place] [address]    Report Status 11/13/2021 FINAL  Final         Radiology Studies: No results found.      Scheduled Meds:  (feeding supplement) PROSource Plus  30 mL Oral BID BM   ARIPiprazole  5 mg Oral Daily   aspirin EC  81 mg Oral Daily   atorvastatin  80 mg Oral QPM   enoxaparin (LOVENOX) injection  0.5 mg/kg Subcutaneous Q24H   escitalopram  20 mg Oral Daily   estradiol  2 mg Oral BID   ezetimibe  10 mg Oral Daily   famotidine  40 mg Oral Daily   feeding supplement  237 mL Oral BID BM   fluticasone furoate-vilanterol  1 puff Inhalation Daily   And   umeclidinium bromide  1 puff Inhalation Daily   gabapentin  600 mg Oral TID   insulin aspart  0-15 Units Subcutaneous TID WC   ipratropium-albuterol  3 mL Nebulization BID   lidocaine  1 patch Transdermal Q24H   multivitamin with minerals  1 tablet Oral Daily   nicotine  7 mg Transdermal Daily   oxybutynin  5 mg Oral BID   prasugrel  10 mg Oral Daily   predniSONE  40 mg Oral Q breakfast   sodium chloride flush  3 mL Intravenous Q12H   sodium chloride flush  3 mL Intravenous Q12H   sodium chloride flush  3 mL Intravenous Q12H   spironolactone  75 mg Oral BID   Continuous Infusions:  sodium chloride       LOS: 3 days      Sidney Ace, MD [facility]   If 7PM-7AM, please contact night-coverage  11/13/2021, 5:23 PM

## 2021-11-13 NOTE — Progress Notes (Signed)
       CROSS COVER NOTE  NAME: [name]. MRN: [id number] DOB : 1965/07/19       HPI/Events of Note   Nurse reports patient complaint of 9/10 burning chest pain relieved to 1/10 after 2 SL nitro and tylenol. ST with non specific T wave abnormalities, similar to prior  Assessment and  Interventions   Assessment:  Plan: Trend troponins Continue to monitor on tele Check [place] NP [facility]

## 2021-11-13 NOTE — Discharge Instructions (Signed)
Servando Salina, RN, CPHN with [facility] [phone] office, [phone] cell) is going to reach out to the patient to schedule her to come in at the health department tomorrow or next week depending on when Ms. Battershell is discharged. They will also do a quantiferon TB test at that time.

## 2021-11-13 NOTE — Care Management Important Message (Signed)
Important Message  Patient Details  Name: [name]. MRN: [id number] Date of Birth: 03-Apr-1965   Medicare Important Message Given:  Yes  Reviewed Medicare IM with patient via room phone due to isolation status.  Copy of Medicare IM to be delivered to patient's room via nursing staff.    Dannette Barbara 11/13/2021, 10:13 AM

## 2021-11-13 NOTE — Progress Notes (Signed)
Pharmacy reached out regarding lipids. Will titrate Lipitor to 80 mg and add Zetia 10 mg.

## 2021-11-14 ENCOUNTER — Telehealth (HOSPITAL_COMMUNITY): Payer: Self-pay

## 2021-11-14 ENCOUNTER — Other Ambulatory Visit (HOSPITAL_COMMUNITY): Payer: Self-pay

## 2021-11-14 LAB — ACID FAST SMEAR (AFB, MYCOBACTERIA): Acid Fast Smear: NEGATIVE

## 2021-11-14 LAB — CBC WITH DIFFERENTIAL/PLATELET
Abs Immature Granulocytes: 2.67 10*3/uL — ABNORMAL HIGH (ref 0.00–0.07)
Basophils Absolute: 0 10*3/uL (ref 0.0–0.1)
Basophils Relative: 0 %
Eosinophils Absolute: 0.2 10*3/uL (ref 0.0–0.5)
Eosinophils Relative: 1 %
HCT: 42.7 % (ref 39.0–52.0)
Hemoglobin: 13.7 g/dL (ref 13.0–17.0)
Immature Granulocytes: 14 %
Lymphocytes Relative: 14 %
Lymphs Abs: 2.6 10*3/uL (ref 0.7–4.0)
MCH: 26.3 pg (ref 26.0–34.0)
MCHC: 32.1 g/dL (ref 30.0–36.0)
MCV: 82.1 fL (ref 80.0–100.0)
Monocytes Absolute: 0.8 10*3/uL (ref 0.1–1.0)
Monocytes Relative: 4 %
Neutro Abs: 12.4 10*3/uL — ABNORMAL HIGH (ref 1.7–7.7)
Neutrophils Relative %: 67 %
Platelets: 313 10*3/uL (ref 150–400)
RBC: 5.2 MIL/uL (ref 4.22–5.81)
RDW: 15.2 % (ref 11.5–15.5)
Smear Review: ADEQUATE
WBC: 18.8 10*3/uL — ABNORMAL HIGH (ref 4.0–10.5)
nRBC: 0 % (ref 0.0–0.2)

## 2021-11-14 LAB — BASIC METABOLIC PANEL
Anion gap: 8 (ref 5–15)
BUN: 20 mg/dL (ref 6–20)
CO2: 29 mmol/L (ref 22–32)
Calcium: 9.5 mg/dL (ref 8.9–10.3)
Chloride: 100 mmol/L (ref 98–111)
Creatinine, Ser: 1.24 mg/dL (ref 0.61–1.24)
GFR, Estimated: >60 mL/min (ref >60)
Glucose, Bld: 114 mg/dL — ABNORMAL HIGH (ref 70–99)
Potassium: 3.9 mmol/L (ref 3.5–5.1)
Sodium: 137 mmol/L (ref 135–145)

## 2021-11-14 LAB — TROPONIN I (HIGH SENSITIVITY): Troponin I (High Sensitivity): 62 ng/L — ABNORMAL HIGH (ref <18)

## 2021-11-14 LAB — GLUCOSE, CAPILLARY: Glucose-Capillary: 109 mg/dL — ABNORMAL HIGH (ref 70–99)

## 2021-11-14 MED ORDER — EZETIMIBE 10 MG PO TABS: 10.0000 mg | ORAL_TABLET | Freq: Every day | ORAL | 0 refills | Status: DC

## 2021-11-14 MED ORDER — SPIRONOLACTONE 50 MG PO TABS: 75.0000 mg | ORAL_TABLET | Freq: Two times a day (BID) | ORAL | 0 refills | Status: DC

## 2021-11-14 MED ORDER — ALBUTEROL SULFATE HFA 108 (90 BASE) MCG/ACT IN AERS: 2.0000 | INHALATION_SPRAY | RESPIRATORY_TRACT | 1 refills | Status: AC | PRN

## 2021-11-14 MED ORDER — ASPIRIN 81 MG PO TBEC: 81.0000 mg | DELAYED_RELEASE_TABLET | Freq: Every day | ORAL | 12 refills | Status: AC

## 2021-11-14 MED ORDER — FLUTICASONE FUROATE-VILANTEROL 200-25 MCG/ACT IN AEPB: 1.0000 | INHALATION_SPRAY | Freq: Every day | RESPIRATORY_TRACT | 0 refills | Status: AC

## 2021-11-14 MED ORDER — PRASUGREL HCL 10 MG PO TABS: 10.0000 mg | ORAL_TABLET | Freq: Every day | ORAL | 0 refills | Status: AC

## 2021-11-14 MED ORDER — PREDNISONE 20 MG PO TABS: 40.0000 mg | ORAL_TABLET | Freq: Every day | ORAL | 0 refills | Status: AC

## 2021-11-14 MED ORDER — UMECLIDINIUM BROMIDE 62.5 MCG/ACT IN AEPB: 1.0000 | INHALATION_SPRAY | Freq: Every day | RESPIRATORY_TRACT | 0 refills | Status: AC

## 2021-11-14 MED ORDER — ATORVASTATIN CALCIUM 80 MG PO TABS: 80.0000 mg | ORAL_TABLET | Freq: Every day | ORAL | 0 refills | Status: DC

## 2021-11-14 NOTE — Telephone Encounter (Signed)
Pharmacy Patient Advocate Encounter  Insurance verification completed.    The patient is insured through [employer]   The patient is currently admitted and ran test claims for the following: Breo Ellipta, Incruse Ellipta.  Copays and coinsurance results were relayed to Inpatient clinical team.

## 2021-11-14 NOTE — Progress Notes (Signed)
At approx. 2120 hrs, pt complaining of 9/10 chest pain in the middle of chest; describing the pain as "sharp and hot". NP made aware, PRN Nitro given, and EKG performed. Pt's pain was relieved after 2 SL nitro tabs given to 1/10 pain. Pt continued to be monitored.

## 2021-11-14 NOTE — Progress Notes (Signed)
  Transition of Care [facility]) Screening Note   Patient Details  Name: [name]. Date of Birth: Sep 22, 1965   Transition of Care [facility]) CM/SW Contact:    Tiburcio Bash, LCSW Phone Number: 11/14/2021, 9:01 AM    Transition of Care Department [facility]) has reviewed patient and no TOC needs have been identified at this time. We will continue to monitor patient advancement through interdisciplinary progression rounds. If new patient transition needs arise, please place a TOC consult.  Kelby Fam, [place], MSW, [place]

## 2021-11-14 NOTE — Discharge Summary (Signed)
Physician Discharge Summary  [name]. PIR:[id number] DOB: Jul 22, 1965 DOA: 11/09/2021  PCP: Howard Pouch, NP  Admit date: 11/09/2021 Discharge date: 11/14/2021  Admitted From: Home Disposition: Home  Recommendations for Outpatient Follow-up:  Follow up with PCP in 1-2 weeks Follow-up outpatient cardiology 2 weeks Follow-up outpatient pulmonology 1 week  Home Health: No Equipment/Devices: None  Discharge Condition: Stable CODE STATUS: Full Diet recommendation: Heart healthy/carb modified  Brief/Interim Summary: This 56 years old male with PMH significant for COPD, type 2 diabetes, CAD, hypertension, hyperlipidemia presented in the ED with complaints of worsening shortness of breath.  She has developed shortness of breath and productive cough for about a week.  She has been using her regular inhalers more frequently without any improvement.  Patient also reports chest pain occurring with cough.  She also reports chest pain when she exerts herself but this has been ongoing for more than a month.  She was hypoxic with SPO2 of 83% on room air placed on 4 L of supplemental oxygen.  CT chest ruled out PE however shows interval development of diffuse tree-in-bud nodularity involving all lobes of both lungs.  Patient is admitted for acute hypoxic respiratory failure likely secondary to COPD exacerbation.     Patient underwent cardiac catheterization with interventional cardiology.  Showed thrombotic lesion in mid RCA suggestive of acute plaque rupture.  Treated with DES x1.  Nonobstructive coronary artery disease was observed in other vessels.  Aggressive secondary prevention with dual antiplatelet therapy and high intensity statin.  Patient also had evidence of acute COPD exacerbation during hospital admission.  Treated with intravenous steroids and bronchodilators.  Initially required supplemental oxygen, weaned off at time of discharge.  Discussed with patient's pulmonologist.   Will discontinue Trelegy and prescribe Incruse and Breo Ellipta on discharge.  Follow-up outpatient pulmonology in 1 week.  Patient had tree-in-bud nodularity seen on CAT scan.  Can be seen in possible nontuberculous mycobacteria.  No clinical indications of acute tuberculous infection.  No cavitary lesions, no fevers, no chills, no night sweats, no weight loss.  Despite the lack of findings infection prevention directed [place] to rule out active tuberculosis.  Sputum for AFB x3 ordered.  The results are pending at time of discharge.  Patient will follow-up with [place] post DC for QuantiFERON gold and follow-up of the studies.    Discharge Diagnoses:  Principal Problem:   COPD with acute exacerbation ([place]) Active Problems:   Stable angina   Essential hypertension   DM2 (diabetes mellitus, type 2) (HCC)   Neurogenic bladder   Depression   Non-ST elevation (NSTEMI) myocardial infarction [facility])  NSTEMI   Status post LHC--> showed thrombotic lesion in the mid RCA suggestive of acute plaque rupture.  This was treated with DES x1.  Otherwise, nonobstructive disease was observed. -Continue dual antiplatelet therapy with aspirin and prasugrel for 12 months. -Aggressive secondary prevention with high intensity statin therapy.   * COPD with acute exacerbation [facility]) Patient presenting with 1 week of increased dyspnea, cough and sputum production requiring increased use of home inhalers.  Initially able to tolerate supplemental oxygen via nasal cannula only, however due to increased work of breathing when oxygen was discontinued, patient has been placed on BiPAP and tolerating well.  CT imaging notable for tree-in-bud nodularity concerning for infectious bronchiolitis.  Given history of COPD and active smoking, will obtain sputum cultures. Plan: PO prednisone 40 mg daily x5 days DC home Trelegy Start Breo and Incruse Ellipta As needed  albuterol for rescue inhaler Follow-up  outpatient pulmonology Follow-up and health department [facility] for QuantiFERON gold and discussion regarding sputum results  Stable angina No chest pain at time of discharge.  We will follow-up with cardiology     Essential hypertension Antihypertensives as above   DM2 (diabetes mellitus, type 2) ([place]) Resume home regimen   Depression - Resume home Lexapro, Wellbutrin and Abilify   Neurogenic bladder - Resume home oxybutynin   Morbid obesity Meets criteria with BMI 38 and chronic comorbidities Complicates care and prognosis  Discharge Instructions  Discharge Instructions     AMB Referral to Cardiac Rehabilitation - Phase II   Complete by: As directed    Diagnosis:  NSTEMI Coronary Stents     After initial evaluation and assessments completed: Virtual Based Care may be provided alone or in conjunction with Phase 2 Cardiac Rehab based on patient barriers.: Yes   Intensive Cardiac Rehabilitation (ICR) [place] location only OR Traditional Cardiac Rehabilitation (TCR) *If criteria for ICR are not met will enroll in TCR [facility] only): Yes   Diet - low sodium heart healthy   Complete by: As directed    Increase activity slowly   Complete by: As directed       Allergies as of 11/14/2021       Reactions   Shellfish Allergy Swelling   Codeine Rash   Lisinopril Cough        Medication List     STOP taking these medications    buPROPion 300 MG 24 hr tablet Commonly known as: WELLBUTRIN XL   hydrochlorothiazide 12.5 MG capsule Commonly known as: MICROZIDE   tiZANidine 4 MG tablet Commonly known as: Zanaflex   Trelegy Ellipta 100-62.5-25 MCG/ACT Aepb Generic drug: Fluticasone-Umeclidin-Vilant       TAKE these medications    albuterol 108 (90 Base) MCG/ACT inhaler Commonly known as: VENTOLIN HFA Inhale 2 puffs into the lungs every 4 (four) hours as needed for wheezing or shortness of breath.   ARIPiprazole 5 MG tablet Commonly known as: ABILIFY Take 5  mg by mouth daily.   aspirin EC 81 MG tablet Take 1 tablet (81 mg total) by mouth daily. Swallow whole.   aspirin-acetaminophen-caffeine 026-378-58 MG tablet Commonly known as: EXCEDRIN MIGRAINE Take by mouth every 6 (six) hours as needed for headache.   atorvastatin 80 MG tablet Commonly known as: LIPITOR Take 1 tablet (80 mg total) by mouth daily. What changed:  medication strength how much to take   escitalopram 20 MG tablet Commonly known as: LEXAPRO Take 20 mg by mouth daily.   estradiol 2 MG tablet Commonly known as: ESTRACE Take 2 mg by mouth in the morning and at bedtime.   ezetimibe 10 MG tablet Commonly known as: ZETIA Take 1 tablet (10 mg total) by mouth daily.   famotidine 40 MG tablet Commonly known as: PEPCID Take 40 mg by mouth daily.   fluticasone furoate-vilanterol 200-25 MCG/ACT Aepb Commonly known as: Breo Ellipta Inhale 1 puff into the lungs daily.   gabapentin 600 MG tablet Commonly known as: NEURONTIN Take 600 mg by mouth 3 (three) times daily.   hydrocortisone 2.5 % lotion APPLY TO AFFECTEDAREA RASH ON BUTTOCKS 3 DAYS PER WEEK. TUESDAY, THURSDAY, SATURDAY, AND AS NEEDED FOR FLARES   ipratropium-albuterol 0.5-2.5 (3) MG/3ML Soln Commonly known as: DUONEB Inhale 3 mLs into the lungs 4 (four) times daily as needed for wheezing.   loratadine 10 MG tablet Commonly known as: CLARITIN Take 10 mg by mouth daily.  meloxicam 15 MG tablet Commonly known as: MOBIC Take 1 tablet (15 mg total) by mouth daily.   metFORMIN 1000 MG tablet Commonly known as: GLUCOPHAGE Take 1,000 mg by mouth 2 (two) times daily with a meal.   oxybutynin 5 MG tablet Commonly known as: DITROPAN Take 5 mg by mouth every 12 (twelve) hours.   pantoprazole 40 MG tablet Commonly known as: Protonix Take 1 tablet (40 mg total) by mouth daily.   prasugrel 10 MG Tabs tablet Commonly known as: EFFIENT Take 1 tablet (10 mg total) by mouth daily.   predniSONE 20 MG  tablet Commonly known as: DELTASONE Take 2 tablets (40 mg total) by mouth daily for 5 days.   simethicone 125 MG chewable tablet Commonly known as: MYLICON Chew 762 mg by mouth every 6 (six) hours as needed for flatulence.   spironolactone 50 MG tablet Commonly known as: ALDACTONE Take 1.5 tablets (75 mg total) by mouth 2 (two) times daily. What changed: how much to take   umeclidinium bromide 62.5 MCG/ACT Aepb Commonly known as: INCRUSE ELLIPTA Inhale 1 puff into the lungs daily. Start taking on: November 15, 2021        Follow-up Information     Howard Pouch, NP. Schedule an appointment as soon as possible for a visit in 1 week(s).   Specialty: Nurse Practitioner Why: Please call and schedule an appointment with Howard Pouch, PA for one week out. Contact information: [place] [address] [phone]         Ottie Glazier, MD. Go in 1 week(s).   Specialty: Pulmonary Disease Why: Appointment on Monday, 11/17/2021 at 1:30pm. Contact information: [place] [address] [phone]         Wellington Hampshire, MD. Schedule an appointment as soon as possible for a visit in 1 week(s).   Specialty: Cardiology Why: Appointment on Thursday, 11/27/2021 at 10:30am. Contact information: [place] [address] [phone]         [place]. Call in 1 day(s).   Why: Call to confirm followup early next week Contact information: [place] [address]               Allergies  Allergen Reactions   Shellfish Allergy Swelling   Codeine Rash   Lisinopril Cough    Consultations: Cardiology  Procedures/Studies: CARDIAC CATHETERIZATION  Result Date: 11/11/2021 Conclusions: Severe single-vessel coronary artery disease with thrombotic-appearing 80-90% mid RCA stenosis followed by irregular segment of up to 60-70% stenosis  in the mid/distal RCA.  There is mild-moderate, nonobstructive coronary artery disease involving the LAD, OM branches, and proximal RCA. Mildly elevated left heart filling pressure (LVEDP 15-20 mmHg with significant respiratory variation). Successful PCI to mid/distal RCA using Onyx Frontier 3.0 x 38 mm drug-eluting stent (post-dilated to 3.3 mm) with 0% residual stenosis and TIMI-3 flow. Recommendations: Dual antiplatelet therapy with aspirin and prasugrel for at least 12 months. Aggressive secondary prevention of coronary artery disease. Nelva Bush, MD [facility]  ECHOCARDIOGRAM COMPLETE  Result Date: 11/10/2021    ECHOCARDIOGRAM REPORT   Patient Name:   [name]. Date of Exam: 11/10/2021 Medical Rec #:  [id number]            Height:       68.0 in Accession #:    [id number]           Weight:  282.2 lb Date of Birth:  [birth year]            BSA:          2.366 m Patient Age:    56 years             BP:           138/92 mmHg Patient Gender: M                    HR:           102 bpm. Exam Location:  [facility] Procedure: 2D Echo, Color Doppler, Cardiac Doppler and Intracardiac            Opacification Agent Indications:     R07.9 Chest Pain  History:         Patient has prior history of Echocardiogram examinations, most                  recent 03/13/2019. CAD, COPD; Risk Factors:Hypertension,                  Diabetes, Dyslipidemia and Sleep Apnea.  Sonographer:     Charmayne Sheer Referring Phys:  [id number] Jose Persia Diagnosing Phys: Yolonda Kida MD  Sonographer Comments: Suboptimal parasternal window. Image acquisition challenging due to patient body habitus. IMPRESSIONS  1. Left ventricular ejection fraction, by estimation, is 65 to 70%. The left ventricle has normal function. The left ventricle has no regional wall motion abnormalities. Left ventricular diastolic parameters are consistent with Grade I diastolic dysfunction (impaired relaxation).  2. Right ventricular systolic function is  normal. The right ventricular size is normal.  3. The mitral valve is normal in structure. Mild mitral valve regurgitation.  4. The aortic valve is normal in structure. Aortic valve regurgitation is trivial. FINDINGS  Left Ventricle: Left ventricular ejection fraction, by estimation, is 65 to 70%. The left ventricle has normal function. The left ventricle has no regional wall motion abnormalities. Definity contrast agent was given IV to delineate the left ventricular  endocardial borders. The left ventricular internal cavity size was normal in size. There is no left ventricular hypertrophy. Left ventricular diastolic parameters are consistent with Grade I diastolic dysfunction (impaired relaxation). Right Ventricle: The right ventricular size is normal. No increase in right ventricular wall thickness. Right ventricular systolic function is normal. Left Atrium: Left atrial size was normal in size. Right Atrium: Right atrial size was normal in size. Pericardium: There is no evidence of pericardial effusion. Mitral Valve: The mitral valve is normal in structure. Mild mitral valve regurgitation. Tricuspid Valve: The tricuspid valve is normal in structure. Tricuspid valve regurgitation is mild. Aortic Valve: The aortic valve is normal in structure. Aortic valve regurgitation is trivial. Aortic valve mean gradient measures 5.0 mmHg. Aortic valve peak gradient measures 9.4 mmHg. Aortic valve area, by VTI measures 2.42 cm. Pulmonic Valve: The pulmonic valve was normal in structure. Pulmonic valve regurgitation is not visualized. Aorta: The ascending aorta was not well visualized. IAS/Shunts: No atrial level shunt detected by color flow Doppler.  LEFT VENTRICLE PLAX 2D LVIDd:         4.70 cm   Diastology LVIDs:         2.90 cm   LV e' medial:    10.00 cm/s LV PW:         1.00 cm   LV E/e' medial:  7.4 LV IVS:        0.90 cm   LV e'  lateral:   10.90 cm/s LVOT diam:     1.90 cm   LV E/e' lateral: 6.8 LV SV:         58 LV SV  Index:   24 LVOT Area:     2.84 cm  RIGHT VENTRICLE RV Basal diam:  3.40 cm TAPSE (M-mode): 3.0 cm LEFT ATRIUM           Index        RIGHT ATRIUM           Index LA diam:      3.80 cm 1.61 cm/m   RA Area:     14.80 cm LA Vol (A4C): 25.5 ml 10.78 ml/m  RA Volume:   36.60 ml  15.47 ml/m  AORTIC VALVE                     PULMONIC VALVE AV Area (Vmax):    2.52 cm      PV Vmax:       0.87 m/s AV Area (Vmean):   2.38 cm      PV Vmean:      61.400 cm/s AV Area (VTI):     2.42 cm      PV VTI:        0.125 m AV Vmax:           153.00 cm/s   PV Peak grad:  3.0 mmHg AV Vmean:          107.000 cm/s  PV Mean grad:  2.0 mmHg AV VTI:            0.238 m AV Peak Grad:      9.4 mmHg AV Mean Grad:      5.0 mmHg LVOT Vmax:         136.00 cm/s LVOT Vmean:        89.900 cm/s LVOT VTI:          0.203 m LVOT/AV VTI ratio: 0.85  AORTA Ao Root diam: 2.50 cm MITRAL VALVE MV Area (PHT): 4.41 cm    SHUNTS MV Decel Time: 172 msec    Systemic VTI:  0.20 m MV E velocity: 74.50 cm/s  Systemic Diam: 1.90 cm MV A velocity: 80.80 cm/s MV E/A ratio:  0.92 Dwayne D Callwood MD Electronically signed by Yolonda Kida MD Signature Date/Time: 11/10/2021/1:17:28 PM    Final    CT Angio Chest PE W and/or Wo Contrast  Result Date: 11/09/2021 CLINICAL DATA:  Worsening shortness of breath. Clinical concern for pulmonary embolus. EXAM: CT ANGIOGRAPHY CHEST WITH CONTRAST TECHNIQUE: Multidetector CT imaging of the chest was performed using the standard protocol during bolus administration of intravenous contrast. Multiplanar CT image reconstructions and MIPs were obtained to evaluate the vascular anatomy. RADIATION DOSE REDUCTION: This exam was performed according to the departmental dose-optimization program which includes automated exposure control, adjustment of the mA and/or kV according to patient size and/or use of iterative reconstruction technique. CONTRAST:  6m OMNIPAQUE IOHEXOL 350 MG/ML SOLN COMPARISON:  08/25/2021 FINDINGS:  Cardiovascular: The heart size is normal. No substantial pericardial effusion. Mild atherosclerotic calcification is noted in the wall of the thoracic aorta. Enlargement of the pulmonary outflow tract/main pulmonary arteries suggests pulmonary arterial hypertension. There is no filling defect within the opacified pulmonary arteries to suggest the presence of an acute pulmonary embolus. Mediastinum/Nodes: Upper normal lymph nodes in the mediastinum and both hilar regions are similar to prior including 12 mm short axis subcarinal node on 82/4.  The esophagus has normal imaging features. There is no axillary lymphadenopathy. Lungs/Pleura: There is diffuse tree-in-bud nodularity in the right upper lobe, right middle lobe, and right lower lobe. Similar but perhaps slightly less advanced changes are noted in the left upper and lower lobes. No dense focal airspace consolidation no pulmonary edema or pleural effusion. Upper Abdomen: Calcified gallstones evident. 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 No followup imaging is recommended. Musculoskeletal: No worrisome lytic or sclerotic osseous abnormality. Review of the MIP images confirms the above findings. IMPRESSION: 1. No CT evidence for acute pulmonary embolus. 2. Enlargement of the pulmonary outflow tract/main pulmonary arteries suggests pulmonary arterial hypertension. 3. Interval development of diffuse tree-in-bud nodularity involving all lobes of both lungs. While nonspecific, this can be seen in the setting of inflammatory or infectious bronchiolitis (including MAI). Follow-up CT chest without contrast in 3 months recommended to ensure resolution. 4. Borderline mediastinal and hilar lymphadenopathy, likely reactive. 5. Cholelithiasis. 6. 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 No followup imaging is recommended. 7.  Aortic Atherosclerosis (ICD10-I70.0). Electronically Signed   By: Misty Stanley M.D.   On: 11/09/2021 14:36   DG Chest 2 View  Result  Date: 11/09/2021 CLINICAL DATA:  Shortness of breath EXAM: CHEST - 2 VIEW COMPARISON:  08/25/2021 FINDINGS: The heart size and mediastinal contours are within normal limits. Pulmonary vascular prominence and mild diffuse interstitial opacity. Disc degenerative disease of the thoracic spine. IMPRESSION: Pulmonary vascular prominence and mild diffuse interstitial opacity, consistent with edema. No focal airspace opacity. Electronically Signed   By: Delanna Ahmadi M.D.   On: 11/09/2021 13:15      Subjective: Seen and examined on day of discharge.  Stable no distress.  Chest pain-free.  Shortness of breath resolved.  Weaned off oxygen.  Stable for DC home.  Sister at bedside.  Discharge Exam: Vitals:   11/14/21 0735 11/14/21 0818  BP:  120/82  Pulse:  81  Resp:  18  Temp:  97.7 F (36.5 C)  SpO2: 97% 96%   Vitals:   11/14/21 0003 11/14/21 0426 11/14/21 0735 11/14/21 0818  BP: 118/84 138/85  120/82  Pulse: 82 84  81  Resp: _0 Temp: (!) 97.5 F (36.4 C) 97.6 F (36.4 C)  97.7 F (36.5 C)  TempSrc:      SpO2: 99% 98% 97% 96%  Weight:      Height:        General: Pt is alert, awake, not in acute distress Cardiovascular: RRR, S1/S2 +, no rubs, no gallops Respiratory: CTA bilaterally, no wheezing, no rhonchi Abdominal: Soft, NT, ND, bowel sounds + Extremities: no edema, no cyanosis    The results of significant diagnostics from this hospitalization (including imaging, microbiology, ancillary and laboratory) are listed below for reference.     Microbiology: Recent Results (from the past 240 hour(s))  Resp Panel by RT-PCR (Flu A&B, Covid) Anterior Nasal Swab     Status: None   Collection Time: 11/09/21 12:30 PM   Specimen: Anterior Nasal Swab  Result Value Ref Range Status   SARS Coronavirus 2 by RT PCR NEGATIVE NEGATIVE Final    Comment: (NOTE) SARS-CoV-2 target nucleic acids are NOT DETECTED.  The SARS-CoV-2 RNA is generally detectable in upper  respiratory specimens during the acute phase of infection. The lowest concentration of SARS-CoV-2 viral copies this assay can detect is 138 copies/mL. A negative result does not preclude SARS-Cov-2 infection and should not be used as the sole basis  for treatment or other patient management decisions. A negative result may occur with  improper specimen collection/handling, submission of specimen other than nasopharyngeal swab, presence of viral mutation(s) within the areas targeted by this assay, and inadequate number of viral copies(<138 copies/mL). A negative result must be combined with clinical observations, patient history, and epidemiological information. The expected result is Negative.  Fact Sheet for Patients:  EntrepreneurPulse.com.au  Fact Sheet for Healthcare Providers:  IncredibleEmployment.be  This test is no t yet approved or cleared by the [place] FDA and  has been authorized for detection and/or diagnosis of SARS-CoV-2 by FDA under an Emergency Use Authorization (EUA). This EUA will remain  in effect (meaning this test can be used) for the duration of the COVID-19 declaration under Section 564(b)(1) of the Act, 21 U.S.C.section 360bbb-3(b)(1), unless the authorization is terminated  or revoked sooner.       Influenza A by PCR NEGATIVE NEGATIVE Final   Influenza B by PCR NEGATIVE NEGATIVE Final    Comment: (NOTE) The Xpert Xpress SARS-CoV-2/FLU/RSV plus assay is intended as an aid in the diagnosis of influenza from Nasopharyngeal swab specimens and should not be used as a sole basis for treatment. Nasal washings and aspirates are unacceptable for Xpert Xpress SARS-CoV-2/FLU/RSV testing.  Fact Sheet for Patients: EntrepreneurPulse.com.au  Fact Sheet for Healthcare Providers: IncredibleEmployment.be  This test is not yet approved or cleared by the [place] FDA and has been  authorized for detection and/or diagnosis of SARS-CoV-2 by FDA under an Emergency Use Authorization (EUA). This EUA will remain in effect (meaning this test can be used) for the duration of the COVID-19 declaration under Section 564(b)(1) of the Act, 21 U.S.C. section 360bbb-3(b)(1), unless the authorization is terminated or revoked.  Performed at [facility], [address] [place], [place] [address]   Expectorated Sputum Assessment w Gram Stain, Rflx to Resp Cult     Status: None   Collection Time: 11/10/21  8:30 PM   Specimen: Expectorated Sputum  Result Value Ref Range Status   Specimen Description EXPECTORATED SPUTUM  Final   Special Requests NONE  Final   Sputum evaluation   Final    THIS SPECIMEN IS ACCEPTABLE FOR SPUTUM CULTURE Performed at [facility], [address], [place], [place] [address]    Report Status 11/10/2021 FINAL  Final  Culture, Respiratory w Gram Stain     Status: None   Collection Time: 11/10/21  8:30 PM  Result Value Ref Range Status   Specimen Description   Final    EXPECTORATED SPUTUM Performed at [facility], [address] [place], [place] [address]    Special Requests   Final    NONE Reflexed from [id number] Performed at [facility], [address] [place], [place] [address]    Gram Stain NO WBC SEEN NO ORGANISMS SEEN   Final   Culture   Final    RARE Normal respiratory flora-no Staph aureus or Pseudomonas seen Performed at [facility], [place] [address], [place], [place] [address]    Report Status 11/13/2021 FINAL  Final     Labs: BNP (last 3 results) Recent Labs    11/09/21 1230  BNP 64.3   Basic Metabolic Panel: Recent Labs  Lab 11/09/21 1230 11/10/21 0025 11/11/21 0606 11/12/21 0802 11/13/21 2233 11/14/21 0846  NA 134* 133* 137 137  --  137  K 3.9 4.8 4.0 3.9  --  3.9  CL 97* 99 102 103  --  100  CO2 _0 --  29  GLUCOSE 126* 177* 105* 106*  --  114*  BUN 17 20  23* 18  --  20  CREATININE 1.28* 1.36* 1.18 1.01  --  1.24  CALCIUM 9.1 9.2 8.9 9.4  --  9.5  MG  --   --   --   --  1.8  --    Liver Function Tests: Recent Labs  Lab 11/09/21 1230  AST 20  ALT 18  ALKPHOS 69  BILITOT 0.8  PROT 8.0  ALBUMIN 3.5   Recent Labs  Lab 11/09/21 1230  LIPASE 30   No results for input(s): "AMMONIA" in the last 168 hours. CBC: Recent Labs  Lab 11/09/21 1230 11/10/21 0325 11/11/21 0606 11/12/21 0802 11/14/21 0846  WBC 7.9 8.5 13.8* 13.5* 18.8*  NEUTROABS  --   --   --   --  12.4*  HGB 12.8* 13.0 11.8* 13.0 13.7  HCT 39.1 40.0 37.3* 40.7 42.7  MCV 80.5 81.3 84.0 81.9 82.1  PLT 309 304 338 335 313   Cardiac Enzymes: No results for input(s): "CKTOTAL", "CKMB", "CKMBINDEX", "TROPONINI" in the last 168 hours. BNP: Invalid input(s): "POCBNP" CBG: Recent Labs  Lab 11/13/21 0801 11/13/21 1211 11/13/21 1647 11/13/21 2025 11/14/21 0855  GLUCAP 112* 132* 130* 153* 109*   D-Dimer No results for input(s): "DDIMER" in the last 72 hours. Hgb A1c No results for input(s): "HGBA1C" in the last 72 hours. Lipid Profile Recent Labs    11/12/21 0802  CHOL 223*  HDL 34*  LDLCALC 150*  TRIG 197*  CHOLHDL 6.6   Thyroid function studies No results for input(s): "TSH", "T4TOTAL", "T3FREE", "THYROIDAB" in the last 72 hours.  Invalid input(s): "FREET3" Anemia work up No results for input(s): "VITAMINB12", "FOLATE", "FERRITIN", "TIBC", "IRON", "RETICCTPCT" in the last 72 hours. Urinalysis    Component Value Date/Time   COLORURINE YELLOW (A) 01/19/2021 1442   APPEARANCEUR CLEAR (A) 01/19/2021 1442   LABSPEC 1.014 01/19/2021 1442   PHURINE 6.0 01/19/2021 1442   GLUCOSEU NEGATIVE 01/19/2021 1442   HGBUR SMALL (A) 01/19/2021 1442   BILIRUBINUR NEGATIVE 01/19/2021 1442   KETONESUR NEGATIVE 01/19/2021 1442   PROTEINUR 100 (A) 01/19/2021 1442   NITRITE NEGATIVE 01/19/2021 1442   LEUKOCYTESUR NEGATIVE 01/19/2021 1442   Sepsis Labs Recent Labs   Lab 11/10/21 0325 11/11/21 0606 11/12/21 0802 11/14/21 0846  WBC 8.5 13.8* 13.5* 18.8*   Microbiology Recent Results (from the past 240 hour(s))  Resp Panel by RT-PCR (Flu A&B, Covid) Anterior Nasal Swab     Status: None   Collection Time: 11/09/21 12:30 PM   Specimen: Anterior Nasal Swab  Result Value Ref Range Status   SARS Coronavirus 2 by RT PCR NEGATIVE NEGATIVE Final    Comment: (NOTE) SARS-CoV-2 target nucleic acids are NOT DETECTED.  The SARS-CoV-2 RNA is generally detectable in upper respiratory specimens during the acute phase of infection. The lowest concentration of SARS-CoV-2 viral copies this assay can detect is 138 copies/mL. A negative result does not preclude SARS-Cov-2 infection and should not be used as the sole basis for treatment or other patient management decisions. A negative result may occur with  improper specimen collection/handling, submission of specimen other than nasopharyngeal swab, presence of viral mutation(s) within the areas targeted by this assay, and inadequate number of viral copies(<138 copies/mL). A negative result must be combined with clinical observations, patient history, and epidemiological information. The expected result is Negative.  Fact Sheet  for Patients:  EntrepreneurPulse.com.au  Fact Sheet for Healthcare Providers:  IncredibleEmployment.be  This test is no t yet approved or cleared by the [place] FDA and  has been authorized for detection and/or diagnosis of SARS-CoV-2 by FDA under an Emergency Use Authorization (EUA). This EUA will remain  in effect (meaning this test can be used) for the duration of the COVID-19 declaration under Section 564(b)(1) of the Act, 21 U.S.C.section 360bbb-3(b)(1), unless the authorization is terminated  or revoked sooner.       Influenza A by PCR NEGATIVE NEGATIVE Final   Influenza B by PCR NEGATIVE NEGATIVE Final    Comment: (NOTE) The Xpert  Xpress SARS-CoV-2/FLU/RSV plus assay is intended as an aid in the diagnosis of influenza from Nasopharyngeal swab specimens and should not be used as a sole basis for treatment. Nasal washings and aspirates are unacceptable for Xpert Xpress SARS-CoV-2/FLU/RSV testing.  Fact Sheet for Patients: EntrepreneurPulse.com.au  Fact Sheet for Healthcare Providers: IncredibleEmployment.be  This test is not yet approved or cleared by the [place] FDA and has been authorized for detection and/or diagnosis of SARS-CoV-2 by FDA under an Emergency Use Authorization (EUA). This EUA will remain in effect (meaning this test can be used) for the duration of the COVID-19 declaration under Section 564(b)(1) of the Act, 21 U.S.C. section 360bbb-3(b)(1), unless the authorization is terminated or revoked.  Performed at [facility], [address] [place], [place] [address]   Expectorated Sputum Assessment w Gram Stain, Rflx to Resp Cult     Status: None   Collection Time: 11/10/21  8:30 PM   Specimen: Expectorated Sputum  Result Value Ref Range Status   Specimen Description EXPECTORATED SPUTUM  Final   Special Requests NONE  Final   Sputum evaluation   Final    THIS SPECIMEN IS ACCEPTABLE FOR SPUTUM CULTURE Performed at [facility], [address] [place], [place] [address]    Report Status 11/10/2021 FINAL  Final  Culture, Respiratory w Gram Stain     Status: None   Collection Time: 11/10/21  8:30 PM  Result Value Ref Range Status   Specimen Description   Final    EXPECTORATED SPUTUM Performed at [facility], [address] [place], [place] [address]    Special Requests   Final    NONE Reflexed from [id number] Performed at [facility], [address] [place], [place] [address]    Gram Stain NO WBC SEEN NO ORGANISMS SEEN   Final   Culture   Final    RARE Normal respiratory flora-no Staph aureus or Pseudomonas  seen Performed at [facility], [place] [address] [place],  [address]    Report Status 11/13/2021 FINAL  Final     Time coordinating discharge: Over 30 minutes  SIGNED:   Sidney Ace, MD  [facility] 11/14/2021, 11:40 AM Pager   If 7PM-7AM, please contact night-coverage

## 2021-11-14 NOTE — TOC Benefit Eligibility Note (Addendum)
Patient Teacher, English as a foreign language completed.    The patient is currently admitted and upon discharge could be taking Breo Ellipta.  The current 30 day co-pay is $0.00.   The patient is currently admitted and upon discharge could be taking Incruse Ellipta.  The current 30 day co-pay is $0.00.   The patient is insured through [employer] Folk, [place] Patient Advocate Specialist [place] Patient Advocate Team Direct Number: [phone] Fax: [phone]

## 2021-11-17 NOTE — Telephone Encounter (Signed)
Patient contacted and scheduled tomorrow, November 7th at 1:00 PM for follow-up sputum collection and labs. Servando Salina, RN

## 2021-11-18 ENCOUNTER — Other Ambulatory Visit: Payer: Self-pay

## 2021-11-19 ENCOUNTER — Telehealth: Payer: Self-pay

## 2021-11-19 NOTE — Telephone Encounter (Signed)
Call to patient to reschedule appointment to Monday, November 13th at 1:00 PM for Sputum collection and QFT. Patient will have time to get transportation to visit.  Servando Salina, RN

## 2021-11-21 ENCOUNTER — Encounter: Payer: Self-pay | Admitting: *Deleted

## 2021-11-24 ENCOUNTER — Telehealth: Payer: Self-pay

## 2021-11-24 ENCOUNTER — Other Ambulatory Visit: Payer: Medicare Other

## 2021-11-24 NOTE — Telephone Encounter (Signed)
Call to patient to f/u on missed appointment. No answer just recoding "Cell phone was working last week". ding call cannot be completed as dialed. Call made to sister Alisha no answer. Left VM message requesting a call back with alternate number to reach patient.   Servando Salina, RN

## 2021-11-25 ENCOUNTER — Telehealth: Payer: Self-pay

## 2021-11-25 NOTE — Telephone Encounter (Addendum)
No return call from yesterday's calls to patient and sister. Attempted to contact patient to reschedule appointment for sputum collection and QFT lab. Phone number listed dialed message "call cannot be completed as dialed". No additional number listed for patient. Servando Salina, RN   12/02/2021 No return call from patient. Lost to LTB follow-up. Servando Salina, RN

## 2021-11-27 ENCOUNTER — Ambulatory Visit: Payer: Medicare Other | Admitting: Medical

## 2021-11-27 NOTE — Congregational Nurse Program (Signed)
  Dept: [phone]   Congregational Nurse Program Note  Date of Encounter: 11/27/2021 Client to [facility] day center for vital sign check and assistance with medical apts. She does not have any minutes on her phone and does not know when her apt is. RN called [place], apt was for today at 10:30, client was unable to attend this apt. Apt rescheduled for 12/8 at 3:45 with [facility].  BP (!) 140/78 (BP Location: Left Arm, Patient Position: Sitting, Cuff Size: Large)   Pulse 95   SpO2 96% . Client reports she is currently living in her car with 2 other friends. She was unaware of the [employer], information given. Information also given about food at the [employer], [employer] of support and information.  Past Medical History: Past Medical History:  Diagnosis Date   Carpal tunnel syndrome    bilateral   COPD (chronic obstructive pulmonary disease) ([place])    Coronary artery disease    Diabetes mellitus without complication ([place])    Endotracheally intubated 09/30/2020   Hyperlipidemia    Hypertension    Neuropathy    On mechanically assisted ventilation ([place]) 09/30/2020   Peptic ulcer    Sleep apnea     Encounter Details:  CNP Questionnaire - 11/27/21 1124       Questionnaire   Ask client: Do you give verbal consent for me to treat you today? Yes    Student Assistance N/A    Location Patient [place]    Visit Setting with Client Organization    Patient Status Unhoused   living in her car currently, gave information on [place] Medicare    Insurance/Financial Assistance Referral N/A    Medication N/A   medications from [place] Provider Yes   Georgina Quint at [place] Appointment Made [facility] PCP/clinic   assisted with apt at [facility], apt 12/8 at 3;45 pm    Recently w/o PCP, now 1st time PCP visit completed due to CNs referral or appointment made N/A    Food Referred to Food Pantry   gave information on [employer], [employer], [employer] and [employer]. gets 23.00/month for food stamps   Transportation N/A   client has her own car, sometimes does not ahve money for gas. Informed about [employer] bus system   Housing/Utilities No permanent housing    Chiropractor N/A    Interventions Advocate/Support;Navigate Healthcare System;Case Management;Educate    Abnormal to Normal Screening Since Last CN Visit N/A    Screenings CN Performed Blood Pressure;Pulse Ox    Sent Client to Lab for: N/A    Did client attend any of the following based off CNs referral or appointments made? N/A    ED Visit Averted N/A    Life-Saving Intervention Made N/A

## 2021-11-27 NOTE — Progress Notes (Deleted)
Cardiology Office Note:    Date:  11/27/2021   ID:  Jonathan Lemons., DOB January 07, 1966, MRN [id number]  PCP:  Howard Pouch, NP  [facility] Cardiologist:  Kate Sable, MD  [place] Electrophysiologist:  None   Referring MD: Howard Pouch, NP   Chief Complaint: Hospital follow-up  History of Present Illness:    Jonathan Vigen. is a 56 y.o. adult with a hx of COPD, type 2 diabetes, CAD, hypertension, hyperlipidemia, current smoker who is being seen for hospital follow-up.  Patient presented to [facility] ER 11/09/2021 with progressively worsening shortness of breath and productive cough.  High-sensitivity troponin trended up to 229.  BNP 44.  Left heart cath showed thrombotic lesion in the mid RCA suggestive of acute plaque rupture.  This was treated with DES x1.  Otherwise nonobstructive disease was observed.  Patient was started on dual antiplatelet therapy with aspirin and Prasugrel for 12 months.  Echo showed LVEF 65 to 64%, grade 1 diastolic dysfunction, mild MR, trivial AI.  She was also treated for COPD with acute on chronic respiratory failure.  Today,  Past Medical History:  Diagnosis Date   Carpal tunnel syndrome    bilateral   COPD (chronic obstructive pulmonary disease) (HCC)    Coronary artery disease    Diabetes mellitus without complication ([place])    Endotracheally intubated 09/30/2020   Hyperlipidemia    Hypertension    Neuropathy    On mechanically assisted ventilation ([place]) 09/30/2020   Peptic ulcer    Sleep apnea     Past Surgical History:  Procedure Laterality Date   COLONOSCOPY WITH PROPOFOL N/A 11/09/2016   Procedure: COLONOSCOPY WITH PROPOFOL;  Surgeon: Lollie Sails, MD;  Location[facility] ENDOSCOPY;  Service: Endoscopy;  Laterality: N/A;   COLONOSCOPY WITH PROPOFOL N/A 07/06/2017   Procedure: COLONOSCOPY WITH PROPOFOL;  Surgeon: Lollie Sails, MD;  Location: [facility] ENDOSCOPY;  Service: Endoscopy;  Laterality: N/A;   CORONARY  STENT INTERVENTION N/A 11/11/2021   Procedure: CORONARY STENT INTERVENTION;  Surgeon: Nelva Bush, MD;  Location: [place] CV LAB;  Service: Cardiovascular;  Laterality: N/A;   ESOPHAGOGASTRODUODENOSCOPY N/A 05/15/2021   Procedure: ESOPHAGOGASTRODUODENOSCOPY (EGD);  Surgeon: Annamaria Helling, DO;  Location[facility] ENDOSCOPY;  Service: Gastroenterology;  Laterality: N/A;  DM   ESOPHAGOGASTRODUODENOSCOPY (EGD) WITH PROPOFOL N/A 11/09/2016   Procedure: ESOPHAGOGASTRODUODENOSCOPY (EGD) WITH PROPOFOL;  Surgeon: Lollie Sails, MD;  Location: [facility] ENDOSCOPY;  Service: Endoscopy;  Laterality: N/A;   ESOPHAGOGASTRODUODENOSCOPY (EGD) WITH PROPOFOL N/A 07/06/2017   Procedure: ESOPHAGOGASTRODUODENOSCOPY (EGD) WITH PROPOFOL;  Surgeon: Lollie Sails, MD;  Location: [facility] ENDOSCOPY;  Service: Endoscopy;  Laterality: N/A;   LEFT HEART CATH AND CORONARY ANGIOGRAPHY N/A 11/11/2021   Procedure: LEFT HEART CATH AND CORONARY ANGIOGRAPHY;  Surgeon: Nelva Bush, MD;  Location: [place] CV LAB;  Service: Cardiovascular;  Laterality: N/A;    Current Medications: No outpatient medications have been marked as taking for the 11/27/21 encounter (Appointment) with Kathlen Mody, Emanuele Mcwhirter H, PA-C.     Allergies:   Shellfish allergy, Codeine, and Lisinopril   Social History   Socioeconomic History   Marital status: Single    Spouse name: Not on file   Number of children: Not on file   Years of education: Not on file   Highest education level: Not on file  Occupational History   Not on file  Tobacco Use   Smoking status: Every Day    Packs/day: 0.75    Years: 30.00  Total pack years: 22.50    Types: Cigarettes    Last attempt to quit: 08/13/2019    Years since quitting: 2.2   Smokeless tobacco: Never   Tobacco comments:    trying decrease  Vaping Use   Vaping Use: Never used  Substance and Sexual Activity   Alcohol use: No   Drug use: No   Sexual activity: Not Currently  Other Topics  Concern   Not on file  Social History Narrative   Not on file   Social Determinants of Health   Financial Resource Strain: Not on file  Food Insecurity: Food Insecurity Present (11/10/2021)   Hunger Vital Sign    Worried About Running Out of Food in the Last Year: Often true    Ran Out of Food in the Last Year: Often true  Transportation Needs: Unmet Transportation Needs (11/10/2021)   PRAPARE - Hydrologist (Medical): Yes    Lack of Transportation (Non-Medical): Yes  Physical Activity: Not on file  Stress: Not on file  Social Connections: Not on file     Family History: The patient's ***family history includes Alcohol abuse in her father; Diabetes in her father and mother; Heart attack in her brother, father, paternal aunt, paternal uncle, and sister; Stomach cancer in her father.  ROS:   Please see the history of present illness.    *** All other systems reviewed and are negative.  EKGs/Labs/Other Studies Reviewed:    The following studies were reviewed today: ***  EKG:  EKG is *** ordered today.  The ekg ordered today demonstrates ***  Recent Labs: 11/09/2021: ALT 18; B Natriuretic Peptide 44.9 11/13/2021: Magnesium 1.8 11/14/2021: BUN 20; Creatinine, Ser 1.24; Hemoglobin 13.7; Platelets 313; Potassium 3.9; Sodium 137  Recent Lipid Panel    Component Value Date/Time   CHOL 223 (H) 11/12/2021 0802   TRIG 197 (H) 11/12/2021 0802   HDL 34 (L) 11/12/2021 0802   CHOLHDL 6.6 11/12/2021 0802   VLDL 39 11/12/2021 0802   LDLCALC 150 (H) 11/12/2021 0802     Risk Assessment/Calculations:   {Does this patient have ATRIAL FIBRILLATION?:[id number]}   Physical Exam:    VS:  There were no vitals taken for this visit.    Wt Readings from Last 3 Encounters:  11/10/21 254 lb 3.1 oz (115.3 kg)  08/25/21 294 lb (133.4 kg)  05/15/21 298 lb (135.2 kg)     GEN: *** Well nourished, well developed in no acute distress HEENT: Normal NECK: No JVD;  No carotid bruits LYMPHATICS: No lymphadenopathy CARDIAC: ***RRR, no murmurs, rubs, gallops RESPIRATORY:  Clear to auscultation without rales, wheezing or rhonchi  ABDOMEN: Soft, non-tender, non-distended MUSCULOSKELETAL:  No edema; No deformity  SKIN: Warm and dry NEUROLOGIC:  Alert and oriented x 3 PSYCHIATRIC:  Normal affect   ASSESSMENT:    No diagnosis found. PLAN:    In order of problems listed above:  Non-STEMI CAD  Hypertension  COPD  Disposition: Follow up {follow up:15908} with ***   Shared Decision Making/Informed Consent   {Are you ordering a CV Procedure (e.g. stress test, cath, DCCV, TEE, etc)?   Press F2        :[id number]}    Signed, Rene Gonsoulin Ninfa Meeker, PA-C  11/27/2021 7:55 AM    [facility]

## 2021-12-08 ENCOUNTER — Telehealth: Payer: Self-pay

## 2021-12-08 NOTE — Telephone Encounter (Signed)
Call to [facility] to f/u on message from NP about drawing a QFT at visit today. Left message we would appreciate if they could draw a QFT and remind patient to contact the [facility] to follow-up on sputum collections. Telephone number provided.  Servando Salina, RN

## 2021-12-11 ENCOUNTER — Telehealth: Payer: Self-pay

## 2021-12-11 NOTE — Telephone Encounter (Signed)
Patient seen at [facility] on 12/08/2021 PCP, Nurse practitioner Howard Pouch ordered a QFT which was collected. RN contacted on 12/10/21 by NP that QFT was negative.Case will not be treated for LTBI and additional sputum will not be collected. Case closed to follow-up.  Servando Salina, RN

## 2021-12-19 ENCOUNTER — Ambulatory Visit: Payer: Medicare Other | Attending: Medical | Admitting: Medical

## 2021-12-19 NOTE — Progress Notes (Deleted)
Cardiology Office Note:    Date:  12/19/2021   ID:  [name]., DOB January 31, 1965, MRN [id number]  PCP:  Howard Pouch, NP  [facility] Cardiologist:  Kate Sable, MD  [facility] Electrophysiologist:  None   Referring MD: Howard Pouch, NP   Chief Complaint: hospital follow-up  History of Present Illness:    Jonathan Hashem. is a 56 y.o. adult with a hx of COPD, DM 2, CAD, HTN, HLD, tobacco use who is being seen for hospital follow-up.   The patient presented 10/2021 with progressive shortness of breath. BNP 44. HS troponin up to 229. Cath showed thrombotic lesion int he med RCA suggestive of acut eplaque rupture. This was treated with DES x 1 other.  Past Medical History:  Diagnosis Date   Carpal tunnel syndrome    bilateral   COPD (chronic obstructive pulmonary disease) (HCC)    Coronary artery disease    Diabetes mellitus without complication ([place])    Endotracheally intubated 09/30/2020   Hyperlipidemia    Hypertension    Neuropathy    On mechanically assisted ventilation ([place]) 09/30/2020   Peptic ulcer    Sleep apnea     Past Surgical History:  Procedure Laterality Date   COLONOSCOPY WITH PROPOFOL N/A 11/09/2016   Procedure: COLONOSCOPY WITH PROPOFOL;  Surgeon: Lollie Sails, MD;  Location: [facility] ENDOSCOPY;  Service: Endoscopy;  Laterality: N/A;   COLONOSCOPY WITH PROPOFOL N/A 07/06/2017   Procedure: COLONOSCOPY WITH PROPOFOL;  Surgeon: Lollie Sails, MD;  Location: [facility] ENDOSCOPY;  Service: Endoscopy;  Laterality: N/A;   CORONARY STENT INTERVENTION N/A 11/11/2021   Procedure: CORONARY STENT INTERVENTION;  Surgeon: Nelva Bush, MD;  Location: [place] CV LAB;  Service: Cardiovascular;  Laterality: N/A;   ESOPHAGOGASTRODUODENOSCOPY N/A 05/15/2021   Procedure: ESOPHAGOGASTRODUODENOSCOPY (EGD);  Surgeon: Annamaria Helling, DO;  Location: [facility] ENDOSCOPY;  Service: Gastroenterology;  Laterality: N/A;  DM    ESOPHAGOGASTRODUODENOSCOPY (EGD) WITH PROPOFOL N/A 11/09/2016   Procedure: ESOPHAGOGASTRODUODENOSCOPY (EGD) WITH PROPOFOL;  Surgeon: Lollie Sails, MD;  Location: [facility] ENDOSCOPY;  Service: Endoscopy;  Laterality: N/A;   ESOPHAGOGASTRODUODENOSCOPY (EGD) WITH PROPOFOL N/A 07/06/2017   Procedure: ESOPHAGOGASTRODUODENOSCOPY (EGD) WITH PROPOFOL;  Surgeon: Lollie Sails, MD;  Location: [facility] ENDOSCOPY;  Service: Endoscopy;  Laterality: N/A;   LEFT HEART CATH AND CORONARY ANGIOGRAPHY N/A 11/11/2021   Procedure: LEFT HEART CATH AND CORONARY ANGIOGRAPHY;  Surgeon: Nelva Bush, MD;  Location: [place] CV LAB;  Service: Cardiovascular;  Laterality: N/A;    Current Medications: No outpatient medications have been marked as taking for the 12/19/21 encounter (Appointment) with Kathlen Mody, Georgianne Gritz H, PA-C.     Allergies:   Shellfish allergy, Codeine, and Lisinopril   Social History   Socioeconomic History   Marital status: Single    Spouse name: Not on file   Number of children: Not on file   Years of education: Not on file   Highest education level: Not on file  Occupational History   Not on file  Tobacco Use   Smoking status: Every Day    Packs/day: 0.75    Years: 30.00    Total pack years: 22.50    Types: Cigarettes    Last attempt to quit: 08/13/2019    Years since quitting: 2.3   Smokeless tobacco: Never   Tobacco comments:    trying decrease  Vaping Use   Vaping Use: Never used  Substance and Sexual Activity   Alcohol use: No   Drug use: No  Sexual activity: Not Currently  Other Topics Concern   Not on file  Social History Narrative   Not on file   Social Determinants of Health   Financial Resource Strain: Not on file  Food Insecurity: Food Insecurity Present (11/10/2021)   Hunger Vital Sign    Worried About Running Out of Food in the Last Year: Often true    Ran Out of Food in the Last Year: Often true  Transportation Needs: Unmet Transportation Needs  (11/10/2021)   PRAPARE - Hydrologist (Medical): Yes    Lack of Transportation (Non-Medical): Yes  Physical Activity: Not on file  Stress: Not on file  Social Connections: Not on file     Family History: The patient's ***family history includes Alcohol abuse in her father; Diabetes in her father and mother; Heart attack in her brother, father, paternal aunt, paternal uncle, and sister; Stomach cancer in her father.  ROS:   Please see the history of present illness.    *** All other systems reviewed and are negative.  EKGs/Labs/Other Studies Reviewed:    The following studies were reviewed today: ***  EKG:  EKG is *** ordered today.  The ekg ordered today demonstrates ***  Recent Labs: 11/09/2021: ALT 18; B Natriuretic Peptide 44.9 11/13/2021: Magnesium 1.8 11/14/2021: BUN 20; Creatinine, Ser 1.24; Hemoglobin 13.7; Platelets 313; Potassium 3.9; Sodium 137  Recent Lipid Panel    Component Value Date/Time   CHOL 223 (H) 11/12/2021 0802   TRIG 197 (H) 11/12/2021 0802   HDL 34 (L) 11/12/2021 0802   CHOLHDL 6.6 11/12/2021 0802   VLDL 39 11/12/2021 0802   LDLCALC 150 (H) 11/12/2021 0802     Risk Assessment/Calculations:   {Does this patient have ATRIAL FIBRILLATION?[phone]}   Physical Exam:    VS:  There were no vitals taken for this visit.    Wt Readings from Last 3 Encounters:  11/10/21 254 lb 3.1 oz (115.3 kg)  08/25/21 294 lb (133.4 kg)  05/15/21 298 lb (135.2 kg)     GEN: *** Well nourished, well developed in no acute distress HEENT: Normal NECK: No JVD; No carotid bruits LYMPHATICS: No lymphadenopathy CARDIAC: ***RRR, no murmurs, rubs, gallops RESPIRATORY:  Clear to auscultation without rales, wheezing or rhonchi  ABDOMEN: Soft, non-tender, non-distended MUSCULOSKELETAL:  No edema; No deformity  SKIN: Warm and dry NEUROLOGIC:  Alert and oriented x 3 PSYCHIATRIC:  Normal affect   ASSESSMENT:    No diagnosis found. PLAN:     In order of problems listed above:  ***  Disposition: Follow up {follow up:15908} with ***   Shared Decision Making/Informed Consent   {Are you ordering a CV Procedure (e.g. stress test, cath, DCCV, TEE, etc)?   Press F2        :[id number]}    Signed, [place] Ninfa Meeker, PA-C  12/19/2021 1:04 PM    [facility]

## 2021-12-22 ENCOUNTER — Telehealth: Payer: Self-pay

## 2021-12-22 ENCOUNTER — Encounter: Payer: Self-pay | Admitting: Medical

## 2021-12-22 NOTE — Telephone Encounter (Signed)
Call made to [place] at [facility] requesting a copy of QFT results to be faxed to [facility]. Fax number along with phone number left on VM.  Servando Salina, RN

## 2021-12-26 LAB — ACID FAST CULTURE WITH REFLEXED SENSITIVITIES (MYCOBACTERIA): Acid Fast Culture: NEGATIVE

## 2021-12-27 LAB — MTB-RIF NAA WITH AFB CULTURE, SPUTUM: Acid Fast Culture: NEGATIVE

## 2021-12-31 LAB — MTB-RIF NAA WITH AFB CULTURE, SPUTUM: Acid Fast Culture: NEGATIVE

## 2022-02-03 ENCOUNTER — Other Ambulatory Visit (HOSPITAL_BASED_OUTPATIENT_CLINIC_OR_DEPARTMENT_OTHER): Payer: Self-pay

## 2022-02-03 DIAGNOSIS — R0681 Apnea, not elsewhere classified: Secondary | ICD-10-CM

## 2022-02-03 DIAGNOSIS — G4733 Obstructive sleep apnea (adult) (pediatric): Secondary | ICD-10-CM

## 2022-05-09 ENCOUNTER — Inpatient Hospital Stay
Admission: EM | Admit: 2022-05-09 | Discharge: 2022-05-13 | DRG: 175 | Disposition: A | Discharge status: Home / Self Care | Payer: 59 | Attending: Internal Medicine | Admitting: Internal Medicine

## 2022-05-09 ENCOUNTER — Emergency Department: Payer: 59

## 2022-05-09 DIAGNOSIS — E119 Type 2 diabetes mellitus without complications: Secondary | ICD-10-CM

## 2022-05-09 DIAGNOSIS — I2510 Atherosclerotic heart disease of native coronary artery without angina pectoris: Secondary | ICD-10-CM | POA: Diagnosis present

## 2022-05-09 DIAGNOSIS — I83012 Varicose veins of right lower extremity with ulcer of calf: Secondary | ICD-10-CM

## 2022-05-09 DIAGNOSIS — Z8249 Family history of ischemic heart disease and other diseases of the circulatory system: Secondary | ICD-10-CM

## 2022-05-09 DIAGNOSIS — G5603 Carpal tunnel syndrome, bilateral upper limbs: Secondary | ICD-10-CM | POA: Diagnosis present

## 2022-05-09 DIAGNOSIS — B9789 Other viral agents as the cause of diseases classified elsewhere: Secondary | ICD-10-CM | POA: Diagnosis present

## 2022-05-09 DIAGNOSIS — I2609 Other pulmonary embolism with acute cor pulmonale: Secondary | ICD-10-CM | POA: Diagnosis not present

## 2022-05-09 DIAGNOSIS — Z91013 Allergy to seafood: Secondary | ICD-10-CM

## 2022-05-09 DIAGNOSIS — F17210 Nicotine dependence, cigarettes, uncomplicated: Secondary | ICD-10-CM | POA: Diagnosis present

## 2022-05-09 DIAGNOSIS — Z885 Allergy status to narcotic agent status: Secondary | ICD-10-CM

## 2022-05-09 DIAGNOSIS — Z955 Presence of coronary angioplasty implant and graft: Secondary | ICD-10-CM

## 2022-05-09 DIAGNOSIS — M5136 Other intervertebral disc degeneration, lumbar region: Secondary | ICD-10-CM | POA: Diagnosis present

## 2022-05-09 DIAGNOSIS — Z1152 Encounter for screening for COVID-19: Secondary | ICD-10-CM

## 2022-05-09 DIAGNOSIS — J441 Chronic obstructive pulmonary disease with (acute) exacerbation: Secondary | ICD-10-CM

## 2022-05-09 DIAGNOSIS — I2699 Other pulmonary embolism without acute cor pulmonale: Secondary | ICD-10-CM | POA: Diagnosis present

## 2022-05-09 DIAGNOSIS — M51369 Other intervertebral disc degeneration, lumbar region without mention of lumbar back pain or lower extremity pain: Secondary | ICD-10-CM | POA: Diagnosis present

## 2022-05-09 DIAGNOSIS — G4733 Obstructive sleep apnea (adult) (pediatric): Secondary | ICD-10-CM | POA: Diagnosis present

## 2022-05-09 DIAGNOSIS — I10 Essential (primary) hypertension: Secondary | ICD-10-CM | POA: Diagnosis present

## 2022-05-09 DIAGNOSIS — R0602 Shortness of breath: Secondary | ICD-10-CM | POA: Diagnosis not present

## 2022-05-09 DIAGNOSIS — E785 Hyperlipidemia, unspecified: Secondary | ICD-10-CM | POA: Diagnosis present

## 2022-05-09 DIAGNOSIS — K279 Peptic ulcer, site unspecified, unspecified as acute or chronic, without hemorrhage or perforation: Secondary | ICD-10-CM | POA: Diagnosis present

## 2022-05-09 DIAGNOSIS — Z79890 Hormone replacement therapy (postmenopausal): Secondary | ICD-10-CM

## 2022-05-09 DIAGNOSIS — Z7984 Long term (current) use of oral hypoglycemic drugs: Secondary | ICD-10-CM

## 2022-05-09 DIAGNOSIS — Z791 Long term (current) use of non-steroidal anti-inflammatories (NSAID): Secondary | ICD-10-CM

## 2022-05-09 DIAGNOSIS — N319 Neuromuscular dysfunction of bladder, unspecified: Secondary | ICD-10-CM | POA: Diagnosis present

## 2022-05-09 DIAGNOSIS — Z6841 Body Mass Index (BMI) 40.0 and over, adult: Secondary | ICD-10-CM

## 2022-05-09 DIAGNOSIS — I731 Thromboangiitis obliterans [Buerger's disease]: Secondary | ICD-10-CM | POA: Diagnosis present

## 2022-05-09 DIAGNOSIS — Z7982 Long term (current) use of aspirin: Secondary | ICD-10-CM

## 2022-05-09 DIAGNOSIS — I252 Old myocardial infarction: Secondary | ICD-10-CM

## 2022-05-09 DIAGNOSIS — E1142 Type 2 diabetes mellitus with diabetic polyneuropathy: Secondary | ICD-10-CM | POA: Diagnosis present

## 2022-05-09 DIAGNOSIS — Z833 Family history of diabetes mellitus: Secondary | ICD-10-CM

## 2022-05-09 DIAGNOSIS — F332 Major depressive disorder, recurrent severe without psychotic features: Secondary | ICD-10-CM

## 2022-05-09 DIAGNOSIS — Z79899 Other long term (current) drug therapy: Secondary | ICD-10-CM

## 2022-05-09 DIAGNOSIS — L97219 Non-pressure chronic ulcer of right calf with unspecified severity: Secondary | ICD-10-CM

## 2022-05-09 DIAGNOSIS — Z888 Allergy status to other drugs, medicaments and biological substances status: Secondary | ICD-10-CM

## 2022-05-09 LAB — COMPREHENSIVE METABOLIC PANEL
ALT: 12 U/L (ref 0–44)
AST: 22 U/L (ref 15–41)
Albumin: 3.4 g/dL — ABNORMAL LOW (ref 3.5–5.0)
Alkaline Phosphatase: 77 U/L (ref 38–126)
Anion gap: 9 (ref 5–15)
BUN: 17 mg/dL (ref 6–20)
CO2: 24 mmol/L (ref 22–32)
Calcium: 9.1 mg/dL (ref 8.9–10.3)
Chloride: 101 mmol/L (ref 98–111)
Creatinine, Ser: 1.13 mg/dL (ref 0.61–1.24)
GFR, Estimated: >60 mL/min (ref >60)
Glucose, Bld: 107 mg/dL — ABNORMAL HIGH (ref 70–99)
Potassium: 4.2 mmol/L (ref 3.5–5.1)
Sodium: 134 mmol/L — ABNORMAL LOW (ref 135–145)
Total Bilirubin: 0.6 mg/dL (ref 0.3–1.2)
Total Protein: 6.8 g/dL (ref 6.5–8.1)

## 2022-05-09 LAB — CBC WITH DIFFERENTIAL/PLATELET
Abs Immature Granulocytes: 0.05 10*3/uL (ref 0.00–0.07)
Basophils Absolute: 0.1 10*3/uL (ref 0.0–0.1)
Basophils Relative: 1 %
Eosinophils Absolute: 0.3 10*3/uL (ref 0.0–0.5)
Eosinophils Relative: 3 %
HCT: 40.5 % (ref 39.0–52.0)
Hemoglobin: 12.8 g/dL — ABNORMAL LOW (ref 13.0–17.0)
Immature Granulocytes: 1 %
Lymphocytes Relative: 15 %
Lymphs Abs: 1.5 10*3/uL (ref 0.7–4.0)
MCH: 25.8 pg — ABNORMAL LOW (ref 26.0–34.0)
MCHC: 31.6 g/dL (ref 30.0–36.0)
MCV: 81.7 fL (ref 80.0–100.0)
Monocytes Absolute: 1 10*3/uL (ref 0.1–1.0)
Monocytes Relative: 9 %
Neutro Abs: 7.5 10*3/uL (ref 1.7–7.7)
Neutrophils Relative %: 71 %
Platelets: 241 10*3/uL (ref 150–400)
RBC: 4.96 MIL/uL (ref 4.22–5.81)
RDW: 15.3 % (ref 11.5–15.5)
WBC: 10.4 10*3/uL (ref 4.0–10.5)
nRBC: 0 % (ref 0.0–0.2)

## 2022-05-09 LAB — BRAIN NATRIURETIC PEPTIDE: B Natriuretic Peptide: 19.1 pg/mL (ref 0.0–100.0)

## 2022-05-09 LAB — TROPONIN I (HIGH SENSITIVITY): Troponin I (High Sensitivity): 4 ng/L (ref <18)

## 2022-05-09 MED ORDER — IOHEXOL 350 MG/ML SOLN
75.0000 mL | Freq: Once | INTRAVENOUS | Status: AC | PRN
  Administered 2022-05-09: 75 mL via INTRAVENOUS

## 2022-05-09 MED ORDER — IPRATROPIUM-ALBUTEROL 0.5-2.5 (3) MG/3ML IN SOLN
3.0000 mL | Freq: Once | RESPIRATORY_TRACT | Status: AC
  Administered 2022-05-09: 3 mL via RESPIRATORY_TRACT
  Filled 2022-05-09: qty 3

## 2022-05-09 MED ORDER — ASPIRIN 81 MG PO CHEW
324.0000 mg | CHEWABLE_TABLET | Freq: Once | ORAL | Status: AC
  Administered 2022-05-10: 324 mg via ORAL
  Filled 2022-05-09: qty 4

## 2022-05-09 MED ORDER — NITROGLYCERIN 0.4 MG SL SUBL
0.4000 mg | SUBLINGUAL_TABLET | SUBLINGUAL | Status: DC | PRN
  Administered 2022-05-09 (×2): 0.4 mg via SUBLINGUAL
  Filled 2022-05-09: qty 1

## 2022-05-09 MED ORDER — ONDANSETRON HCL 4 MG/2ML IJ SOLN
4.0000 mg | Freq: Once | INTRAMUSCULAR | Status: AC
  Administered 2022-05-09: 4 mg via INTRAVENOUS
  Filled 2022-05-09: qty 2

## 2022-05-09 NOTE — ED Triage Notes (Signed)
Pt BIBA from home. Pt reports SOB for the past 2 days and CP for the past 2 hours. Pt reports cough and no relief from wheezing with duonebs. Pt given 2 duonebs, solumedrol and 324mg  aspirin PTA. Provider at bedside. Pt A&Ox4. Tachypnic

## 2022-05-09 NOTE — ED Provider Notes (Signed)
[facility] Provider Note    Event Date/Time   First MD Initiated Contact with Patient 05/09/22 2143     (approximate)   History   Cough, Shortness of Breath, and Chest Pain   HPI  [name]. is a 57 y.o. adult with past medical history of COPD, diabetes, hypertension hyperlipidemia, coronary disease status post MI who presents with shortness of breath and chest pain.  Patient tells me that she has been short of breath for the last 2 days.  Chest pain started about 2 hours ago.  It is a stabbing feeling in the center of the chest that does not radiate it does feel worse with movement but is not pleuritic.  Denies cough fevers chills hemoptysis.  Does take estradiol.  Denies any increasing lower extremity swelling or pain.  No fevers or chills no nausea vomiting.  Says symptoms feel similar to when she had a heart attack back in October of last year.     Past Medical History:  Diagnosis Date   Carpal tunnel syndrome    bilateral   COPD (chronic obstructive pulmonary disease) (HCC)    Coronary artery disease    Diabetes mellitus without complication (HCC)    Endotracheally intubated 09/30/2020   Hyperlipidemia    Hypertension    Neuropathy    On mechanically assisted ventilation (HCC) 09/30/2020   Peptic ulcer    Sleep apnea     Patient Active Problem List   Diagnosis Date Noted   Non-ST elevation (NSTEMI) myocardial infarction (HCC)    Stable angina 11/09/2021   COPD with acute exacerbation (HCC) 08/26/2021   COPD exacerbation (HCC) 08/25/2021   Dyslipidemia 08/25/2021   GERD without esophagitis 08/25/2021   Type 2 diabetes mellitus with peripheral neuropathy (HCC) 08/25/2021   Acute respiratory failure with hypoxia (HCC) 08/25/2021   Overdose 09/30/2020   Suicidal ideation 09/30/2020   Acute on chronic respiratory failure with hypoxia (HCC) 09/30/2020   AKI (acute kidney injury) (HCC) 09/30/2020   Acute encephalopathy 09/30/2020    Lactic acidosis 09/30/2020   Depression 09/30/2020   Venous stasis dermatitis of both lower extremities 08/17/2019   Pain due to onychomycosis of toenails of both feet 05/15/2019   Chronic low back pain (1ry area of Pain) (Bilateral) w/o sciatica 03/09/2019   Bilateral lower leg cellulitis 06/27/2018   Osteoarthritis of facet joint of lumbar spine 02/14/2018   Osteoarthritis involving multiple joints 02/14/2018   Chronic musculoskeletal pain 02/14/2018   Neurogenic pain 02/14/2018   Abnormal MRI, lumbar spine 02/14/2018   History of allergy to shellfish 01/27/2018   Morbid obesity with BMI of 45.0-49.9, adult (HCC) 12/13/2017   Swelling of limb 11/26/2017   Lower limb ulcer, calf, left, limited to breakdown of skin (HCC) 11/26/2017   DM2 (diabetes mellitus, type 2) (HCC) 11/03/2017   Neurogenic bladder 11/03/2017   Spondylosis without myelopathy or radiculopathy, lumbar region 11/03/2017   Lumbar facet syndrome (Bilateral) (R>L) 11/03/2017   DDD (degenerative disc disease), lumbar 11/02/2017   Lumbar facet hypertrophy (Bilateral) 11/02/2017   Lumbar central spinal stenosis (L4-5) 11/02/2017   Lumbar lateral recess stenosis (L4-5) (Bilateral) (L>R) 11/02/2017   Vitamin D insufficiency 11/02/2017   Lumbar spondylosis 11/02/2017   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   Grief 10/18/2017   Chronic low back pain (Bilateral) (R>L) w/ sciatica (Bilateral) 10/07/2017   Chronic lower extremity pain (Secondary Area of Pain) (Bilateral) (R>L) 10/07/2017   Chronic pain syndrome 10/07/2017  Pharmacologic therapy 10/07/2017   Disorder of skeletal system 10/07/2017   Problems influencing health status 10/07/2017   Barrett's esophagus without dysplasia 03/10/2017   Colon cancer screening 10/14/2016   Essential hypertension 02/11/2016   Hyperlipidemia 02/11/2016   Tobacco dependence 02/11/2016   Obesity 02/11/2016   Lower limb ulcer, ankle, left, with  fat layer exposed (HCC) 02/11/2016   Lymphedema 02/11/2016   Cellulitis 01/06/2016     Physical Exam  Triage Vital Signs: ED Triage Vitals [05/09/22 2140]  Enc Vitals Group     BP      Pulse      Resp      Temp      Temp src      SpO2 93 %     Weight      Height      Head Circumference      Peak Flow      Pain Score      Pain Loc      Pain Edu?      Excl. in GC?     Most recent vital signs: Vitals:   05/09/22 2140 05/09/22 2145  BP:  118/76  Pulse:  91  Resp:  (!) 34  Temp:  99.2 F (37.3 C)  SpO2: 93% 94%     General: Awake, no distress.  CV:  Good peripheral perfusion.  Resp:  Patient is tachypneic with rales at the bases and wheezing and rhonchi throughout but to speak in full sentences Abd:  No distention.  Neuro:             Awake, Alert, Oriented x 3  Other:  Chronic skin darkening of the bilateral lower extremities with pitting edema   ED Results / Procedures / Treatments  Labs (all labs ordered are listed, but only abnormal results are displayed) Labs Reviewed  COMPREHENSIVE METABOLIC PANEL - Abnormal; Notable for the following components:      Result Value   Sodium 134 (*)    Glucose, Bld 107 (*)    Albumin 3.4 (*)    All other components within normal limits  CBC WITH DIFFERENTIAL/PLATELET - Abnormal; Notable for the following components:   Hemoglobin 12.8 (*)    MCH 25.8 (*)    All other components within normal limits  BRAIN NATRIURETIC PEPTIDE  TROPONIN I (HIGH SENSITIVITY)  TROPONIN I (HIGH SENSITIVITY)     EKG  EKG interpretation performed by myself: NSR, nml axis, nml intervals, no acute ischemic changes    RADIOLOGY I reviewed and interpreted the CXR which does not show any acute cardiopulmonary process    PROCEDURES:  Critical Care performed: No  Procedures  The patient is on the cardiac monitor to evaluate for evidence of arrhythmia and/or significant heart rate changes.   MEDICATIONS ORDERED IN ED: Medications   nitroGLYCERIN (NITROSTAT) SL tablet 0.4 mg (0.4 mg Sublingual Given 05/09/22 2206)  aspirin chewable tablet 324 mg (has no administration in time range)  ipratropium-albuterol (DUONEB) 0.5-2.5 (3) MG/3ML nebulizer solution 3 mL (3 mLs Nebulization Given 05/09/22 2203)  ipratropium-albuterol (DUONEB) 0.5-2.5 (3) MG/3ML nebulizer solution 3 mL (3 mLs Nebulization Given 05/09/22 2202)  ondansetron (ZOFRAN) injection 4 mg (4 mg Intravenous Given 05/09/22 2201)  iohexol (OMNIPAQUE) 350 MG/ML injection 75 mL (75 mLs Intravenous Contrast Given 05/09/22 2317)     IMPRESSION / MDM / ASSESSMENT AND PLAN / ED COURSE  I reviewed the triage vital signs and the nursing notes.  Patient's presentation is most consistent with acute presentation with potential threat to life or bodily function.  Differential diagnosis includes, but is not limited to, COPD exacerbation, pneumothorax, pneumonia, ACS, CHF exacerbation and pulmonary  The patient is a 57 year old who presents with shortness of breath and chest pain.  Shortness of breath has been going on for 2 days no fevers or chills or cough.  Chest pain started about 2 hours ago is sharp nonpleuritic does feel worse with movement.  On arrival patient is tachypneic but able to speak in full sentence slightly diaphoretic.  Does have some pitting edema in the lower extremities has some rales and wheezing.  EKG does not show obvious ischemic changes.  Patient says this feels similar to last admission in October which she was treated both for COPD exacerbation and underwent stenting.  Will give sublingual nitro and DuoNebs.  Patient is already received Solu-Medrol with EMS.  Plan to obtain chest x-ray and cardiac enzymes.  Patient's chest x-ray shows possible atelectasis versus developing infiltrate.  Patient is on estrogen therapy given hypoxia tachycardia will obtain CT angio to rule out pulmonary embolism.  Patient's troponin is negative.   On repeat assessment he continues to have some wheezing and increased work of breathing but overall is improved.  He is on 2 L nasal cannula currently.  CT angio is pending.  Given ongoing wheezing and dyspnea I do think he will need admission for COPD exacerbation.       FINAL CLINICAL IMPRESSION(S) / ED DIAGNOSES   Final diagnoses:  COPD exacerbation (HCC)     Rx / DC Orders   ED Discharge Orders     None        Note:  This document was prepared using Dragon voice recognition software and may include unintentional dictation errors.   Georga Hacking, MD 05/09/22 [phone]

## 2022-05-10 ENCOUNTER — Other Ambulatory Visit: Payer: Self-pay

## 2022-05-10 ENCOUNTER — Inpatient Hospital Stay: Payer: 59

## 2022-05-10 DIAGNOSIS — I2699 Other pulmonary embolism without acute cor pulmonale: Secondary | ICD-10-CM | POA: Diagnosis not present

## 2022-05-10 DIAGNOSIS — F332 Major depressive disorder, recurrent severe without psychotic features: Secondary | ICD-10-CM | POA: Diagnosis present

## 2022-05-10 DIAGNOSIS — E1142 Type 2 diabetes mellitus with diabetic polyneuropathy: Secondary | ICD-10-CM | POA: Diagnosis present

## 2022-05-10 DIAGNOSIS — Z7984 Long term (current) use of oral hypoglycemic drugs: Secondary | ICD-10-CM | POA: Diagnosis not present

## 2022-05-10 DIAGNOSIS — M5136 Other intervertebral disc degeneration, lumbar region: Secondary | ICD-10-CM | POA: Diagnosis present

## 2022-05-10 DIAGNOSIS — Z833 Family history of diabetes mellitus: Secondary | ICD-10-CM | POA: Diagnosis not present

## 2022-05-10 DIAGNOSIS — N319 Neuromuscular dysfunction of bladder, unspecified: Secondary | ICD-10-CM | POA: Diagnosis present

## 2022-05-10 DIAGNOSIS — Z1152 Encounter for screening for COVID-19: Secondary | ICD-10-CM | POA: Diagnosis not present

## 2022-05-10 DIAGNOSIS — E785 Hyperlipidemia, unspecified: Secondary | ICD-10-CM | POA: Diagnosis present

## 2022-05-10 DIAGNOSIS — Z888 Allergy status to other drugs, medicaments and biological substances status: Secondary | ICD-10-CM | POA: Diagnosis not present

## 2022-05-10 DIAGNOSIS — G4733 Obstructive sleep apnea (adult) (pediatric): Secondary | ICD-10-CM | POA: Diagnosis present

## 2022-05-10 DIAGNOSIS — Z6841 Body Mass Index (BMI) 40.0 and over, adult: Secondary | ICD-10-CM | POA: Diagnosis not present

## 2022-05-10 DIAGNOSIS — I2609 Other pulmonary embolism with acute cor pulmonale: Secondary | ICD-10-CM | POA: Diagnosis present

## 2022-05-10 DIAGNOSIS — Z955 Presence of coronary angioplasty implant and graft: Secondary | ICD-10-CM | POA: Diagnosis not present

## 2022-05-10 DIAGNOSIS — J441 Chronic obstructive pulmonary disease with (acute) exacerbation: Secondary | ICD-10-CM

## 2022-05-10 DIAGNOSIS — I2510 Atherosclerotic heart disease of native coronary artery without angina pectoris: Secondary | ICD-10-CM | POA: Diagnosis present

## 2022-05-10 DIAGNOSIS — I10 Essential (primary) hypertension: Secondary | ICD-10-CM | POA: Diagnosis present

## 2022-05-10 DIAGNOSIS — B9789 Other viral agents as the cause of diseases classified elsewhere: Secondary | ICD-10-CM | POA: Diagnosis present

## 2022-05-10 DIAGNOSIS — K279 Peptic ulcer, site unspecified, unspecified as acute or chronic, without hemorrhage or perforation: Secondary | ICD-10-CM | POA: Diagnosis present

## 2022-05-10 DIAGNOSIS — I252 Old myocardial infarction: Secondary | ICD-10-CM | POA: Diagnosis not present

## 2022-05-10 DIAGNOSIS — I731 Thromboangiitis obliterans [Buerger's disease]: Secondary | ICD-10-CM | POA: Diagnosis present

## 2022-05-10 DIAGNOSIS — R0602 Shortness of breath: Secondary | ICD-10-CM | POA: Diagnosis present

## 2022-05-10 DIAGNOSIS — F17210 Nicotine dependence, cigarettes, uncomplicated: Secondary | ICD-10-CM | POA: Diagnosis present

## 2022-05-10 DIAGNOSIS — G5603 Carpal tunnel syndrome, bilateral upper limbs: Secondary | ICD-10-CM | POA: Diagnosis present

## 2022-05-10 DIAGNOSIS — Z8249 Family history of ischemic heart disease and other diseases of the circulatory system: Secondary | ICD-10-CM | POA: Diagnosis not present

## 2022-05-10 LAB — RESPIRATORY PANEL BY PCR

## 2022-05-10 LAB — COMPREHENSIVE METABOLIC PANEL
ALT: 14 U/L (ref 0–44)
AST: 18 U/L (ref 15–41)
Albumin: 3.7 g/dL (ref 3.5–5.0)
Alkaline Phosphatase: 83 U/L (ref 38–126)
Anion gap: 11 (ref 5–15)
BUN: 18 mg/dL (ref 6–20)
CO2: 23 mmol/L (ref 22–32)
Calcium: 9.3 mg/dL (ref 8.9–10.3)
Chloride: 101 mmol/L (ref 98–111)
Creatinine, Ser: 1.1 mg/dL (ref 0.61–1.24)
GFR, Estimated: >60 mL/min (ref >60)
Glucose, Bld: 146 mg/dL — ABNORMAL HIGH (ref 70–99)
Potassium: 4.4 mmol/L (ref 3.5–5.1)
Sodium: 135 mmol/L (ref 135–145)
Total Bilirubin: 0.4 mg/dL (ref 0.3–1.2)
Total Protein: 7.3 g/dL (ref 6.5–8.1)

## 2022-05-10 LAB — CBC
HCT: 40.5 % (ref 39.0–52.0)
Hemoglobin: 13.1 g/dL (ref 13.0–17.0)
MCH: 26.5 pg (ref 26.0–34.0)
MCHC: 32.3 g/dL (ref 30.0–36.0)
MCV: 81.8 fL (ref 80.0–100.0)
Platelets: 232 10*3/uL (ref 150–400)
RBC: 4.95 MIL/uL (ref 4.22–5.81)
RDW: 15.3 % (ref 11.5–15.5)
WBC: 12.7 10*3/uL — ABNORMAL HIGH (ref 4.0–10.5)
nRBC: 0 % (ref 0.0–0.2)

## 2022-05-10 LAB — HEMOGLOBIN A1C
Hgb A1c MFr Bld: 5.9 % — ABNORMAL HIGH (ref 4.8–5.6)
Mean Plasma Glucose: 122.63 mg/dL

## 2022-05-10 LAB — HEPARIN LEVEL (UNFRACTIONATED)
Heparin Unfractionated: 0.33 IU/mL (ref 0.30–0.70)
Heparin Unfractionated: 0.27 IU/mL — ABNORMAL LOW (ref 0.30–0.70)
Heparin Unfractionated: 0.3 IU/mL (ref 0.30–0.70)

## 2022-05-10 LAB — TROPONIN I (HIGH SENSITIVITY): Troponin I (High Sensitivity): 3 ng/L (ref <18)

## 2022-05-10 LAB — GLUCOSE, CAPILLARY
Glucose-Capillary: 194 mg/dL — ABNORMAL HIGH (ref 70–99)
Glucose-Capillary: 125 mg/dL — ABNORMAL HIGH (ref 70–99)
Glucose-Capillary: 177 mg/dL — ABNORMAL HIGH (ref 70–99)

## 2022-05-10 LAB — BRAIN NATRIURETIC PEPTIDE: B Natriuretic Peptide: 24.4 pg/mL (ref 0.0–100.0)

## 2022-05-10 MED ORDER — GUAIFENESIN 100 MG/5ML PO LIQD
5.0000 mL | ORAL | Status: DC | PRN
  Administered 2022-05-10: 5 mL via ORAL
  Filled 2022-05-10: qty 10

## 2022-05-10 MED ORDER — ALBUTEROL SULFATE (2.5 MG/3ML) 0.083% IN NEBU
2.5000 mg | INHALATION_SOLUTION | RESPIRATORY_TRACT | Status: DC | PRN
  Administered 2022-05-10 – 2022-05-12 (×2): 2.5 mg via RESPIRATORY_TRACT
  Filled 2022-05-10 (×2): qty 3

## 2022-05-10 MED ORDER — HEPARIN (PORCINE) 25000 UT/250ML-% IV SOLN
2000.0000 [IU]/h | INTRAVENOUS | Status: DC
  Administered 2022-05-10: 1800 [IU]/h via INTRAVENOUS
  Administered 2022-05-10: 1600 [IU]/h via INTRAVENOUS
  Administered 2022-05-11 (×2): 1800 [IU]/h via INTRAVENOUS
  Administered 2022-05-12: 2000 [IU]/h via INTRAVENOUS
  Filled 2022-05-10 (×5): qty 250

## 2022-05-10 MED ORDER — METHYLPREDNISOLONE SODIUM SUCC 40 MG IJ SOLR
40.0000 mg | Freq: Two times a day (BID) | INTRAMUSCULAR | Status: AC
  Administered 2022-05-10 (×2): 40 mg via INTRAVENOUS
  Filled 2022-05-10 (×2): qty 1

## 2022-05-10 MED ORDER — EZETIMIBE 10 MG PO TABS
10.0000 mg | ORAL_TABLET | Freq: Every day | ORAL | Status: DC
  Administered 2022-05-10 – 2022-05-13 (×4): 10 mg via ORAL
  Filled 2022-05-10 (×4): qty 1

## 2022-05-10 MED ORDER — HEPARIN BOLUS VIA INFUSION
1450.0000 [IU] | Freq: Once | INTRAVENOUS | Status: AC
  Administered 2022-05-10: 1450 [IU] via INTRAVENOUS
  Filled 2022-05-10: qty 1450

## 2022-05-10 MED ORDER — HEPARIN SODIUM (PORCINE) 5000 UNIT/ML IJ SOLN
4000.0000 [IU] | Freq: Once | INTRAMUSCULAR | Status: AC
  Administered 2022-05-10: 4000 [IU] via INTRAVENOUS
  Filled 2022-05-10: qty 1

## 2022-05-10 MED ORDER — METOPROLOL TARTRATE 5 MG/5ML IV SOLN: 5.0000 mg | INTRAVENOUS | Status: DC | PRN

## 2022-05-10 MED ORDER — BUDESONIDE 0.5 MG/2ML IN SUSP
0.5000 mg | Freq: Two times a day (BID) | RESPIRATORY_TRACT | Status: DC
  Administered 2022-05-10 – 2022-05-13 (×6): 0.5 mg via RESPIRATORY_TRACT
  Filled 2022-05-10 (×6): qty 2

## 2022-05-10 MED ORDER — PREDNISONE 20 MG PO TABS
40.0000 mg | ORAL_TABLET | Freq: Every day | ORAL | Status: DC
  Administered 2022-05-11: 40 mg via ORAL
  Filled 2022-05-10: qty 2

## 2022-05-10 MED ORDER — ATORVASTATIN CALCIUM 20 MG PO TABS
40.0000 mg | ORAL_TABLET | Freq: Every day | ORAL | Status: DC
  Administered 2022-05-10 – 2022-05-13 (×4): 40 mg via ORAL
  Filled 2022-05-10 (×4): qty 2

## 2022-05-10 MED ORDER — ACETAMINOPHEN 650 MG RE SUPP: 650.0000 mg | Freq: Four times a day (QID) | RECTAL | Status: DC | PRN

## 2022-05-10 MED ORDER — ACETAMINOPHEN 325 MG PO TABS
650.0000 mg | ORAL_TABLET | Freq: Four times a day (QID) | ORAL | Status: DC | PRN
  Administered 2022-05-10 – 2022-05-13 (×5): 650 mg via ORAL
  Filled 2022-05-10 (×5): qty 2

## 2022-05-10 MED ORDER — HYDRALAZINE HCL 20 MG/ML IJ SOLN: 10.0000 mg | INTRAMUSCULAR | Status: DC | PRN

## 2022-05-10 MED ORDER — GUAIFENESIN ER 600 MG PO TB12
600.0000 mg | ORAL_TABLET | Freq: Two times a day (BID) | ORAL | Status: DC
  Administered 2022-05-10 – 2022-05-13 (×7): 600 mg via ORAL
  Filled 2022-05-10 (×7): qty 1

## 2022-05-10 MED ORDER — GABAPENTIN 300 MG PO CAPS
600.0000 mg | ORAL_CAPSULE | Freq: Three times a day (TID) | ORAL | Status: DC
  Administered 2022-05-10 – 2022-05-13 (×11): 600 mg via ORAL
  Filled 2022-05-10 (×11): qty 2

## 2022-05-10 MED ORDER — BACLOFEN 10 MG PO TABS
10.0000 mg | ORAL_TABLET | Freq: Three times a day (TID) | ORAL | Status: DC | PRN
  Administered 2022-05-10 – 2022-05-13 (×5): 10 mg via ORAL
  Filled 2022-05-10 (×7): qty 1

## 2022-05-10 MED ORDER — IPRATROPIUM-ALBUTEROL 0.5-2.5 (3) MG/3ML IN SOLN: 3.0000 mL | Freq: Three times a day (TID) | RESPIRATORY_TRACT | Status: DC

## 2022-05-10 MED ORDER — ASPIRIN 81 MG PO TBEC
81.0000 mg | DELAYED_RELEASE_TABLET | Freq: Every day | ORAL | Status: DC
  Administered 2022-05-10 – 2022-05-13 (×4): 81 mg via ORAL
  Filled 2022-05-10 (×4): qty 1

## 2022-05-10 MED ORDER — ALBUTEROL SULFATE (2.5 MG/3ML) 0.083% IN NEBU: 2.5000 mg | INHALATION_SOLUTION | RESPIRATORY_TRACT | Status: DC | PRN

## 2022-05-10 MED ORDER — IPRATROPIUM-ALBUTEROL 0.5-2.5 (3) MG/3ML IN SOLN
3.0000 mL | Freq: Two times a day (BID) | RESPIRATORY_TRACT | Status: DC
  Administered 2022-05-11: 3 mL via RESPIRATORY_TRACT
  Filled 2022-05-10: qty 3

## 2022-05-10 MED ORDER — INSULIN ASPART 100 UNIT/ML IJ SOLN
0.0000 [IU] | Freq: Three times a day (TID) | INTRAMUSCULAR | Status: DC
  Administered 2022-05-10 – 2022-05-12 (×4): 1 [IU] via SUBCUTANEOUS
  Filled 2022-05-10 (×4): qty 1

## 2022-05-10 MED ORDER — IPRATROPIUM-ALBUTEROL 0.5-2.5 (3) MG/3ML IN SOLN
3.0000 mL | Freq: Once | RESPIRATORY_TRACT | Status: AC
  Administered 2022-05-10: 3 mL via RESPIRATORY_TRACT
  Filled 2022-05-10: qty 3

## 2022-05-10 MED ORDER — IPRATROPIUM-ALBUTEROL 0.5-2.5 (3) MG/3ML IN SOLN
3.0000 mL | Freq: Four times a day (QID) | RESPIRATORY_TRACT | Status: DC
  Administered 2022-05-10: 3 mL via RESPIRATORY_TRACT
  Filled 2022-05-10: qty 3

## 2022-05-10 MED ORDER — DOXYCYCLINE HYCLATE 100 MG PO TABS
100.0000 mg | ORAL_TABLET | Freq: Two times a day (BID) | ORAL | Status: DC
  Administered 2022-05-10 – 2022-05-13 (×8): 100 mg via ORAL
  Filled 2022-05-10 (×8): qty 1

## 2022-05-10 MED ORDER — ONDANSETRON HCL 4 MG PO TABS: 4.0000 mg | ORAL_TABLET | Freq: Four times a day (QID) | ORAL | Status: DC | PRN

## 2022-05-10 MED ORDER — SENNOSIDES-DOCUSATE SODIUM 8.6-50 MG PO TABS: 1.0000 | ORAL_TABLET | Freq: Every evening | ORAL | Status: DC | PRN

## 2022-05-10 MED ORDER — ONDANSETRON HCL 4 MG/2ML IJ SOLN: 4.0000 mg | Freq: Four times a day (QID) | INTRAMUSCULAR | Status: DC | PRN

## 2022-05-10 NOTE — Progress Notes (Signed)
ANTICOAGULATION CONSULT NOTE - Initial Consult  Pharmacy Consult for Heparin  Indication: pulmonary embolus  Allergies  Allergen Reactions   Shellfish Allergy Swelling   Codeine Rash   Lisinopril Cough    Patient Measurements: Height: 5\' 8"  (172.7 cm) Weight: 123 kg (271 lb 2.7 oz) IBW/kg (Calculated) : 68.4 Heparin Dosing Weight:  96.7 kg   Vital Signs: Temp: 99.2 F (37.3 C) (04/27 2145) Temp Source: Oral (04/27 2145) BP: 118/76 (04/27 2145) Pulse Rate: 91 (04/27 2145)  Labs: Recent Labs    05/09/22 2141  HGB 12.8*  HCT 40.5  PLT 241  CREATININE 1.13  TROPONINIHS 4    Estimated Creatinine Clearance (by C-G formula based on SCr of 1.13 mg/dL) Male: 16.1 mL/min Male: 93.1 mL/min   Medical History: Past Medical History:  Diagnosis Date   Carpal tunnel syndrome    bilateral   COPD (chronic obstructive pulmonary disease) (HCC)    Coronary artery disease    Diabetes mellitus without complication (HCC)    Endotracheally intubated 09/30/2020   Hyperlipidemia    Hypertension    Neuropathy    On mechanically assisted ventilation (HCC) 09/30/2020   Peptic ulcer    Sleep apnea     Medications:  (Not in a hospital admission)   Assessment: Pharmacy consulted to dose heparin in this 57 year old male (trans male) admitted with PE.  Pt received heparin 4000 units SQ X 1 in ED on 4/28 @ 0016, no other prior anticoag noted.  CrCl = ? ,  SrCr = 1.13  Goal of Therapy:  Heparin level 0.3-0.7 units/ml Monitor platelets by anticoagulation protocol: Yes   Plan:  Heparin 4000 units SQ X 1 given in ED on 4/28 @ 0016. Start heparin infusion at 1600 units/hr Check anti-Xa level in 6 hours and daily while on heparin Continue to monitor H&H and platelets  Korrina Zern D 05/10/2022,12:26 AM

## 2022-05-10 NOTE — Progress Notes (Signed)
ANTICOAGULATION CONSULT NOTE  Pharmacy Consult for IV Heparin  Indication: pulmonary embolus  Patient Measurements: Height: 5\' 8"  (172.7 cm) Weight: 120.2 kg (265 lb) IBW/kg (Calculated) : 68.4 Heparin Dosing Weight:  96.7 kg   Labs: Recent Labs    05/09/22 2141 05/10/22 0003 05/10/22 0108 05/10/22 0708 05/10/22 1251  HGB 12.8*  --  13.1  --   --   HCT 40.5  --  40.5  --   --   PLT 241  --  232  --   --   HEPARINUNFRC  --   --   --  0.33 0.27*  CREATININE 1.13  --  1.10  --   --   TROPONINIHS 4 3  --   --   --     Estimated Creatinine Clearance (by C-G formula based on SCr of 1.1 mg/dL) Male: 16.1 mL/min Male: 94.5 mL/min  Medical History: Past Medical History:  Diagnosis Date   Carpal tunnel syndrome    bilateral   COPD (chronic obstructive pulmonary disease) (HCC)    Coronary artery disease    Diabetes mellitus without complication (HCC)    Endotracheally intubated 09/30/2020   Hyperlipidemia    Hypertension    Neuropathy    On mechanically assisted ventilation (HCC) 09/30/2020   Peptic ulcer    Sleep apnea     Assessment: Pharmacy consulted to dose heparin in this 57 year old male (trans male) admitted with PE. Pt received heparin 4000 units IV x 1 in ED on 4/28 at 0016, no other prior anticoag noted.   0428 0708 HL 0.33, therapeutic x 1; 1600 un/hr 0428 1251 HL 0.27, subtherapeutic; 1600 un/hr  Goal of Therapy:  Heparin level 0.3-0.7 units/ml Monitor platelets by anticoagulation protocol: Yes   Plan:  --Heparin level is subtherapeutic at 0.27 --Give bolus of 1450 units x 1. Increase heparin infusion rate to 1800 units/hr --Re-check HL in 6 hours --Daily CBC per protocol while on IV heparin  Manfred Shirts 05/10/2022,1:27 PM

## 2022-05-10 NOTE — Progress Notes (Signed)
Patient SOB and wheezing in all lung fields, albuterol administered per order. Assisted patient back to bed and fan provided.   Patient resting comfortably on 2L nasal cannula.

## 2022-05-10 NOTE — ED Provider Notes (Signed)
Pt received in signout from Dr. Sidney Ace pending CTA chest to evaluate for PE in the setting of chest pain, dyspnea and tachycardia while on estrogen therapy for gender transition.  CTA does show evidence of PE and right heart strain.  I reevaluate the patient and she is still wheezing, with stigmata of coexisting COPD exacerbation.  I explained CT results, PE and estrogen being a possible etiology of VTE.  We discussed starting anticoagulation and admission, she is agreeable.  I consult medicine for admission.  .Critical Care  Performed by: Delton Prairie, MD Authorized by: Delton Prairie, MD   Critical care provider statement:    Critical care time (minutes):  30   Critical care time was exclusive of:  Separately billable procedures and treating other patients   Critical care was necessary to treat or prevent imminent or life-threatening deterioration of the following conditions:  Cardiac failure, circulatory failure and respiratory failure   Critical care was time spent personally by me on the following activities:  Development of treatment plan with patient or surrogate, discussions with consultants, evaluation of patient's response to treatment, examination of patient, ordering and review of laboratory studies, ordering and review of radiographic studies, ordering and performing treatments and interventions, pulse oximetry, re-evaluation of patient's condition and review of old charts     Delton Prairie, MD 05/10/22 0006

## 2022-05-10 NOTE — Progress Notes (Signed)
PROGRESS NOTE    [name].  ZOX:[id number] DOB: [birth year] DOA: 05/09/2022 PCP: Jodi Marble, NP   Brief Narrative:  57 year old with history of COPD, CAD, DM2, HLD, HTN, neuropathy, PUD, OSA comes to the ED with complaints of shortness of breath ongoing for 2 days despite of using home bronchodilators.  In the ER CTA chest showed subsegmental pulmonary embolism and bilateral upper and lower lobes and started on heparin drip.   Assessment & Plan:  Principal Problem:   PE (pulmonary thromboembolism) (HCC) Active Problems:   DDD (degenerative disc disease), lumbar   Morbid obesity with BMI of 45.0-49.9, adult (HCC)   DM2 (diabetes mellitus, type 2) (HCC)   Hyperlipidemia   Type 2 diabetes mellitus with peripheral neuropathy (HCC)    Acute bilateral pulmonary embolism with cor pulmonale -Suspect in the setting of hormonal therapy.  Estrogen discontinued.  Currently on heparin drip - Echocardiogram - Lower extremity Dopplers  Acute COPD exacerbation Rhinovirus infection - Prednisone.  Bronchodilators.  I-S/flutter valve  History of CAD status post DC in 2003 Hyperlipidemia - On aspirin, statin and Zetia  Diabetes mellitus type 2 with peripheral neuropathy - Sliding scale and Accu-Cheks.  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  Peptic ulcer disease -PPI  Obstructive sleep apnea  DVT prophylaxis: Heparin drip Code Status: Full code Family Communication:   Status is: Inpatient Continue hospital stay for treatment of pulmonary embolism      Diet Orders (From admission, onward)     Start     Ordered   05/10/22 0048  Diet heart healthy/carb modified Room service appropriate? Yes; Fluid consistency: Thin  Diet effective now       Question Answer Comment  Diet-HS Snack? Nothing   Room service appropriate? Yes   Fluid  consistency: Thin      05/10/22 0050            Subjective: Patient still reporting of congestion and shortness of breath which acutely started 3 days ago Denies any prior history of malignancy, blood clots or recent major surgery  Examination:  General exam: Appears calm and comfortable  Respiratory system: Mild rhonchi I Cardiovascular system: S1 & S2 heard, RRR. No JVD, murmurs, rubs, gallops or clicks. No pedal edema. Gastrointestinal system: Abdomen is nondistended, soft and nontender. No organomegaly or masses felt. Normal bowel sounds heard. Central nervous system: Alert and oriented. No focal neurological deficits. Extremities: Symmetric 5 x 5 power. Skin: No rashes, lesions or ulcers Psychiatry: Judgement and insight appear normal. Mood & affect appropriate.  Objective: Vitals:   05/10/22 0541 05/10/22 0600 05/10/22 0744 05/10/22 0838  BP: 106/83   128/80  Pulse: 96   98  Resp: 20   18  Temp: 97.7 F (36.5 C)   97.9 F (36.6 C)  TempSrc: Oral   Oral  SpO2: 93%  96% 95%  Weight:  120.2 kg    Height:        Intake/Output Summary (Last 24 hours) at 05/10/2022 0900 Last data filed at 05/10/2022 0747 Gross per 24 hour  Intake 484.25 ml  Output 800 ml  Net -315.75 ml   Filed Weights   05/09/22 2147 05/10/22 0450 05/10/22 0600  Weight: 123 kg 123 kg 120.2 kg    Scheduled Meds:  aspirin EC  81 mg Oral Daily   atorvastatin  40 mg Oral Daily  budesonide (PULMICORT) nebulizer solution  0.5 mg Nebulization BID   doxycycline  100 mg Oral Q12H   ezetimibe  10 mg Oral Daily   gabapentin  600 mg Oral TID   guaiFENesin  600 mg Oral BID   insulin aspart  0-6 Units Subcutaneous TID WC   ipratropium-albuterol  3 mL Nebulization TID   methylPREDNISolone (SOLU-MEDROL) injection  40 mg Intravenous Q12H   Followed by   Melene Muller ON 05/11/2022] predniSONE  40 mg Oral Q breakfast   Continuous Infusions:  heparin 1,600 Units/hr (05/10/22 0034)    Nutritional status      Body mass index is 40.29 kg/m.  Data Reviewed:   CBC: Recent Labs  Lab 05/09/22 2141 05/10/22 0108  WBC 10.4 12.7*  NEUTROABS 7.5  --   HGB 12.8* 13.1  HCT 40.5 40.5  MCV 81.7 81.8  PLT 241 232   Basic Metabolic Panel: Recent Labs  Lab 05/09/22 2141 05/10/22 0108  NA 134* 135  K 4.2 4.4  CL 101 101  CO2 24 23  GLUCOSE 107* 146*  BUN 17 18  CREATININE 1.13 1.10  CALCIUM 9.1 9.3   GFR: Estimated Creatinine Clearance (by C-G formula based on SCr of 1.1 mg/dL) Male: 57.8 mL/min Male: 94.5 mL/min Liver Function Tests: Recent Labs  Lab 05/09/22 2141 05/10/22 0108  AST 22 18  ALT 12 14  ALKPHOS 77 83  BILITOT 0.6 0.4  PROT 6.8 7.3  ALBUMIN 3.4* 3.7   No results for input(s): "LIPASE", "AMYLASE" in the last 168 hours. No results for input(s): "AMMONIA" in the last 168 hours. Coagulation Profile: No results for input(s): "INR", "PROTIME" in the last 168 hours. Cardiac Enzymes: No results for input(s): "CKTOTAL", "CKMB", "CKMBINDEX", "TROPONINI" in the last 168 hours. BNP (last 3 results) No results for input(s): "PROBNP" in the last 8760 hours. HbA1C: No results for input(s): "HGBA1C" in the last 72 hours. CBG: Recent Labs  Lab 05/10/22 0803  GLUCAP 194*   Lipid Profile: No results for input(s): "CHOL", "HDL", "LDLCALC", "TRIG", "CHOLHDL", "LDLDIRECT" in the last 72 hours. Thyroid Function Tests: No results for input(s): "TSH", "T4TOTAL", "FREET4", "T3FREE", "THYROIDAB" in the last 72 hours. Anemia Panel: No results for input(s): "VITAMINB12", "FOLATE", "FERRITIN", "TIBC", "IRON", "RETICCTPCT" in the last 72 hours. Sepsis Labs: No results for input(s): "PROCALCITON", "LATICACIDVEN" in the last 168 hours.  No results found for this or any previous visit (from the past 240 hour(s)).       Radiology Studies: CT Angio Chest PE W and/or Wo Contrast  Result Date: 05/09/2022 CLINICAL DATA:  Chest pain. EXAM: CT ANGIOGRAPHY CHEST WITH CONTRAST  TECHNIQUE: Multidetector CT imaging of the chest was performed using the standard protocol during bolus administration of intravenous contrast. Multiplanar CT image reconstructions and MIPs were obtained to evaluate the vascular anatomy. RADIATION DOSE REDUCTION: This exam was performed according to the departmental dose-optimization program which includes automated exposure control, adjustment of the mA and/or kV according to patient size and/or use of iterative reconstruction technique. CONTRAST:  75mL OMNIPAQUE IOHEXOL 350 MG/ML SOLN COMPARISON:  CT angiogram chest 11/09/2021 FINDINGS: Cardiovascular: There subsegmental pulmonary emboli within the bilateral lower lobes and left upper lobe. There is no central pulmonary embolism. Heart and aorta are normal in size. There are atherosclerotic calcifications of the aorta. There is no pericardial effusion. Mediastinum/Nodes: No enlarged mediastinal, hilar, or axillary lymph nodes. Thyroid gland, trachea, and esophagus demonstrate no significant findings. Lungs/Pleura: Mild paraseptal emphysematous changes are present. There is no focal  lung infiltrate, pleural effusion or pneumothorax. Upper Abdomen: No acute abnormality. Musculoskeletal: No chest wall abnormality. No acute or significant osseous findings. Bilateral gynecomastia again seen. Review of the MIP images confirms the above findings. IMPRESSION: 1. Subsegmental pulmonary emboli within the bilateral lower lobes and left upper lobe. No central pulmonary embolism. Positive for acute PE with CT evidence of right heart strain (RV/LV Ratio = 1.1) consistent with at least submassive (intermediate risk) PE. The presence of right heart strain has been associated with an increased risk of morbidity and mortality. Aortic Atherosclerosis (ICD10-I70.0) and Emphysema (ICD10-J43.9). These results were called by telephone at the time of interpretation on 05/09/2022 at 11:59 pm to provider [facility] , who verbally  acknowledged these results. Electronically Signed   By: Darliss Cheney M.D.   On: 05/09/2022 23:59   DG Chest Portable 1 View  Result Date: 05/09/2022 CLINICAL DATA:  Shortness of breath. EXAM: PORTABLE CHEST 1 VIEW COMPARISON:  November 09, 2021 FINDINGS: The heart size and mediastinal contours are within normal limits. The lungs are hyperinflated. Mild, diffusely increased interstitial lung markings are noted. This is slightly more prominent within the bilateral lung bases. There is no evidence of focal consolidation, pleural effusion or pneumothorax. The visualized skeletal structures are unremarkable. IMPRESSION: 1. Findings likely consistent with COPD. 2. Mild bibasilar atelectasis versus early infiltrate. Electronically Signed   By: Aram Candela M.D.   On: 05/09/2022 22:01           LOS: 0 days   Time spent= 35 mins    Jonathan Mellor Joline Maxcy, MD [facility]  If 7PM-7AM, please contact night-coverage  05/10/2022, 9:00 AM

## 2022-05-10 NOTE — Progress Notes (Signed)
ANTICOAGULATION CONSULT NOTE  Pharmacy Consult for IV Heparin  Indication: pulmonary embolus  Patient Measurements: Height: 5\' 8"  (172.7 cm) Weight: 120.2 kg (265 lb) IBW/kg (Calculated) : 68.4 Heparin Dosing Weight:  96.7 kg   Labs: Recent Labs    05/09/22 2141 05/10/22 0003 05/10/22 0108 05/10/22 0708  HGB 12.8*  --  13.1  --   HCT 40.5  --  40.5  --   PLT 241  --  232  --   HEPARINUNFRC  --   --   --  0.33  CREATININE 1.13  --  1.10  --   TROPONINIHS 4 3  --   --     Estimated Creatinine Clearance (by C-G formula based on SCr of 1.1 mg/dL) Male: 40.9 mL/min Male: 94.5 mL/min  Medical History: Past Medical History:  Diagnosis Date   Carpal tunnel syndrome    bilateral   COPD (chronic obstructive pulmonary disease) (HCC)    Coronary artery disease    Diabetes mellitus without complication (HCC)    Endotracheally intubated 09/30/2020   Hyperlipidemia    Hypertension    Neuropathy    On mechanically assisted ventilation (HCC) 09/30/2020   Peptic ulcer    Sleep apnea     Assessment: Pharmacy consulted to dose heparin in this 57 year old male (trans male) admitted with PE. Pt received heparin 4000 units IV x 1 in ED on 4/28 at 0016, no other prior anticoag noted.   0428 0708 HL 0.33, therapeutic x 1; 1600 un/hr  Goal of Therapy:  Heparin level 0.3-0.7 units/ml Monitor platelets by anticoagulation protocol: Yes   Plan:  --Heparin level is therapeutic x 1 --Continue heparin infusion at 1600 units/hr --Re-check HL in 6 hours --Daily CBC per protocol while on IV heparin  [name] 05/10/2022,8:13 AM

## 2022-05-10 NOTE — H&P (Signed)
History and Physical    [name]. ZOX:[id number] DOB: [birth year] DOA: 05/09/2022  PCP: Jodi Marble, NP  Patient coming from:   I have personally briefly reviewed patient's old medical records in [facility]  Chief Complaint: sob/doe/chest pain   HPI: [name]. is a 57 y.o. adult with medical history significant of COPD, CAD, DMII, HLD, HTN, neuropathy,  PUD, OSA who presents to ED BIB EMS with complaint of sob x 2 days and acute onset of CP 2hours PTA. Pt also reports associated cough and wheezing  not relieved with home inhalers. Due to acute progression EMS was called. Patient currently notes no chest pain but still feels sob, although improved from prior. Patient notes no fever/chills, was nauseated but this symptoms is also improving.   ED Course:  Tmx 99.2, bp 118/76, HR 91, rr 34 sat 94%  Wbc: 10.4, hgb 12.8, plt 241,  Na 134, K 4.2 , CL 101, glu 107, cr 1.13  BNP 19.1 CE4 CXR: IMPRESSION: 1. Findings likely consistent with COPD. 2. Mild bibasilar atelectasis versus early infiltrate. EKG:  Sinus tachycardia , borderline right axis deviation  CTPE IMPRESSION: 1. Subsegmental pulmonary emboli within the bilateral lower lobes and left upper lobe. No central pulmonary embolism. Positive for acute PE with CT evidence of right heart strain (RV/LV Ratio = 1.1) consistent with at least submassive (intermediate risk) PE. The presence of right heart strain has been associated with an increased risk of morbidity and mortality.   Tx nitro SL , zofran, duoneb, asa 324, heparin drip  Review of Systems: As per HPI otherwise 10 point review of systems negative.   Past Medical History:  Diagnosis Date   Carpal tunnel syndrome    bilateral   COPD (chronic obstructive pulmonary disease) (HCC)    Coronary artery disease    Diabetes mellitus without complication (HCC)    Endotracheally intubated 09/30/2020   Hyperlipidemia    Hypertension     Neuropathy    On mechanically assisted ventilation (HCC) 09/30/2020   Peptic ulcer    Sleep apnea     Past Surgical History:  Procedure Laterality Date   COLONOSCOPY WITH PROPOFOL N/A 11/09/2016   Procedure: COLONOSCOPY WITH PROPOFOL;  Surgeon: Christena Deem, MD;  Location: [facility] ENDOSCOPY;  Service: Endoscopy;  Laterality: N/A;   COLONOSCOPY WITH PROPOFOL N/A 07/06/2017   Procedure: COLONOSCOPY WITH PROPOFOL;  Surgeon: Christena Deem, MD;  Location: [facility] ENDOSCOPY;  Service: Endoscopy;  Laterality: N/A;   CORONARY STENT INTERVENTION N/A 11/11/2021   Procedure: CORONARY STENT INTERVENTION;  Surgeon: Yvonne Kendall, MD;  Location: [facility] INVASIVE CV LAB;  Service: Cardiovascular;  Laterality: N/A;   ESOPHAGOGASTRODUODENOSCOPY N/A 05/15/2021   Procedure: ESOPHAGOGASTRODUODENOSCOPY (EGD);  Surgeon: Jaynie Collins, DO;  Location: [facility] ENDOSCOPY;  Service: Gastroenterology;  Laterality: N/A;  DM   ESOPHAGOGASTRODUODENOSCOPY (EGD) WITH PROPOFOL N/A 11/09/2016   Procedure: ESOPHAGOGASTRODUODENOSCOPY (EGD) WITH PROPOFOL;  Surgeon: Christena Deem, MD;  Location: [facility] ENDOSCOPY;  Service: Endoscopy;  Laterality: N/A;   ESOPHAGOGASTRODUODENOSCOPY (EGD) WITH PROPOFOL N/A 07/06/2017   Procedure: ESOPHAGOGASTRODUODENOSCOPY (EGD) WITH PROPOFOL;  Surgeon: Christena Deem, MD;  Location: [facility] ENDOSCOPY;  Service: Endoscopy;  Laterality: N/A;   LEFT HEART CATH AND CORONARY ANGIOGRAPHY N/A 11/11/2021   Procedure: LEFT HEART CATH AND CORONARY ANGIOGRAPHY;  Surgeon: Yvonne Kendall, MD;  Location: [facility] INVASIVE CV LAB;  Service: Cardiovascular;  Laterality: N/A;     reports that she has been smoking cigarettes. She has a 22.50 pack-year  smoking history. She has never used smokeless tobacco. She reports that she does not drink alcohol and does not use drugs.  Allergies  Allergen Reactions   Shellfish Allergy Swelling   Codeine Rash   Lisinopril Cough    Family History  Problem Relation  Age of Onset   Diabetes Mother    Diabetes Father    Stomach cancer Father    Heart attack Father    Alcohol abuse Father    Heart attack Sister    Heart attack Brother    Heart attack Paternal Aunt    Heart attack Paternal Uncle     Prior to Admission medications   Medication Sig Start Date End Date Taking? Authorizing Provider  albuterol (VENTOLIN HFA) 108 (90 Base) MCG/ACT inhaler Inhale 2 puffs into the lungs every 4 (four) hours as needed for wheezing or shortness of breath. 11/14/21 12/14/21  Tresa Moore, MD  ARIPiprazole (ABILIFY) 5 MG tablet Take 5 mg by mouth daily. 10/22/21   [provider]  aspirin EC 81 MG tablet Take 1 tablet (81 mg total) by mouth daily. Swallow whole. 11/14/21   Tresa Moore, MD  aspirin-acetaminophen-caffeine (EXCEDRIN MIGRAINE) [phone] MG tablet Take by mouth every 6 (six) hours as needed for headache.    [provider]  atorvastatin (LIPITOR) 80 MG tablet Take 1 tablet (80 mg total) by mouth daily. 11/14/21 12/14/21  Tresa Moore, MD  escitalopram (LEXAPRO) 20 MG tablet Take 20 mg by mouth daily. 10/06/19 11/09/21  [provider]  estradiol (ESTRACE) 2 MG tablet Take 2 mg by mouth in the morning and at bedtime. 03/13/20   [provider]  ezetimibe (ZETIA) 10 MG tablet Take 1 tablet (10 mg total) by mouth daily. 11/14/21 12/14/21  Tresa Moore, MD  famotidine (PEPCID) 40 MG tablet Take 40 mg by mouth daily. 06/20/21   [provider]  gabapentin (NEURONTIN) 600 MG tablet Take 600 mg by mouth 3 (three) times daily. 07/23/21   [provider]  hydrocortisone 2.5 % lotion APPLY TO AFFECTEDAREA RASH ON BUTTOCKS 3 DAYS PER WEEK. TUESDAY, THURSDAY, SATURDAY, AND AS NEEDED FOR FLARES 07/16/20   Deirdre Evener, MD  ipratropium-albuterol (DUONEB) 0.5-2.5 (3) MG/3ML SOLN Inhale 3 mLs into the lungs 4 (four) times daily as needed for wheezing. 08/29/21 08/24/22  [provider]   loratadine (CLARITIN) 10 MG tablet Take 10 mg by mouth daily.    [provider]  meloxicam (MOBIC) 15 MG tablet Take 1 tablet (15 mg total) by mouth daily. 11/15/19 11/09/21  Delano Metz, MD  metFORMIN (GLUCOPHAGE) 1000 MG tablet Take 1,000 mg by mouth 2 (two) times daily with a meal.    [provider]  oxybutynin (DITROPAN) 5 MG tablet Take 5 mg by mouth every 12 (twelve) hours.    [provider]  pantoprazole (PROTONIX) 40 MG tablet Take 1 tablet (40 mg total) by mouth daily. 01/19/21 01/19/22  Cuthriell, Delorise Royals, PA-C  simethicone (MYLICON) 125 MG chewable tablet Chew 125 mg by mouth every 6 (six) hours as needed for flatulence.    [provider]  spironolactone (ALDACTONE) 50 MG tablet Take 1.5 tablets (75 mg total) by mouth 2 (two) times daily. 11/14/21 12/14/21  Tresa Moore, MD    Physical Exam: Vitals:   05/09/22 2140 05/09/22 2145 05/09/22 2147  BP:  118/76   Pulse:  91   Resp:  (!) 34   Temp:  99.2 F (37.3 C)  TempSrc:  Oral   SpO2: 93% 94%   Weight:   123 kg  Height:   5\' 8"  (1.727 m)    Constitutional: NAD, calm, comfortable Vitals:   05/09/22 2140 05/09/22 2145 05/09/22 2147  BP:  118/76   Pulse:  91   Resp:  (!) 34   Temp:  99.2 F (37.3 C)   TempSrc:  Oral   SpO2: 93% 94%   Weight:   123 kg  Height:   5\' 8"  (1.727 m)   Eyes: PERRL, lids and conjunctivae normal ENMT: Mucous membranes are moist. Posterior pharynx clear of any exudate or lesions.Normal dentition.  Neck: normal, supple, no masses, no thyromegaly Respiratory: clear to auscultation bilaterally, no wheezing, no crackles. Normal respiratory effort. No accessory muscle use.  Cardiovascular: Regular rate and rhythm, no murmurs / rubs / gallops. No extremity edema. 2+ pedal pulses. No carotid bruits.  Abdomen: no tenderness, no masses palpated. No hepatosplenomegaly. Bowel sounds positive.  Musculoskeletal: no clubbing / cyanosis. No joint deformity  upper and lower extremities. Good ROM, no contractures. Normal muscle tone.  Skin: no rashes, lesions, ulcers. No induration Neurologic: CN 2-12 grossly intact. Sensation intact,. Strength 5/5 in all 4.  Psychiatric: Normal judgment and insight. Alert and oriented x 3. Normal mood.    Labs on Admission: I have personally reviewed following labs and imaging studies  CBC: Recent Labs  Lab 05/09/22 2141  WBC 10.4  NEUTROABS 7.5  HGB 12.8*  HCT 40.5  MCV 81.7  PLT 241   Basic Metabolic Panel: Recent Labs  Lab 05/09/22 2141  NA 134*  K 4.2  CL 101  CO2 24  GLUCOSE 107*  BUN 17  CREATININE 1.13  CALCIUM 9.1   GFR: Estimated Creatinine Clearance (by C-G formula based on SCr of 1.13 mg/dL) Male: 16.1 mL/min Male: 93.1 mL/min Liver Function Tests: Recent Labs  Lab 05/09/22 2141  AST 22  ALT 12  ALKPHOS 77  BILITOT 0.6  PROT 6.8  ALBUMIN 3.4*   No results for input(s): "LIPASE", "AMYLASE" in the last 168 hours. No results for input(s): "AMMONIA" in the last 168 hours. Coagulation Profile: No results for input(s): "INR", "PROTIME" in the last 168 hours. Cardiac Enzymes: No results for input(s): "CKTOTAL", "CKMB", "CKMBINDEX", "TROPONINI" in the last 168 hours. BNP (last 3 results) No results for input(s): "PROBNP" in the last 8760 hours. HbA1C: No results for input(s): "HGBA1C" in the last 72 hours. CBG: No results for input(s): "GLUCAP" in the last 168 hours. Lipid Profile: No results for input(s): "CHOL", "HDL", "LDLCALC", "TRIG", "CHOLHDL", "LDLDIRECT" in the last 72 hours. Thyroid Function Tests: No results for input(s): "TSH", "T4TOTAL", "FREET4", "T3FREE", "THYROIDAB" in the last 72 hours. Anemia Panel: No results for input(s): "VITAMINB12", "FOLATE", "FERRITIN", "TIBC", "IRON", "RETICCTPCT" in the last 72 hours. Urine analysis:    Component Value Date/Time   COLORURINE YELLOW (A) 01/19/2021 1442   APPEARANCEUR CLEAR (A) 01/19/2021 1442   LABSPEC  1.014 01/19/2021 1442   PHURINE 6.0 01/19/2021 1442   GLUCOSEU NEGATIVE 01/19/2021 1442   HGBUR SMALL (A) 01/19/2021 1442   BILIRUBINUR NEGATIVE 01/19/2021 1442   KETONESUR NEGATIVE 01/19/2021 1442   PROTEINUR 100 (A) 01/19/2021 1442   NITRITE NEGATIVE 01/19/2021 1442   LEUKOCYTESUR NEGATIVE 01/19/2021 1442    Radiological Exams on Admission: CT Angio Chest PE W and/or Wo Contrast  Result Date: 05/09/2022 CLINICAL DATA:  Chest pain. EXAM: CT ANGIOGRAPHY CHEST WITH CONTRAST TECHNIQUE: Multidetector CT imaging of the chest was performed  using the standard protocol during bolus administration of intravenous contrast. Multiplanar CT image reconstructions and MIPs were obtained to evaluate the vascular anatomy. RADIATION DOSE REDUCTION: This exam was performed according to the departmental dose-optimization program which includes automated exposure control, adjustment of the mA and/or kV according to patient size and/or use of iterative reconstruction technique. CONTRAST:  75mL OMNIPAQUE IOHEXOL 350 MG/ML SOLN COMPARISON:  CT angiogram chest 11/09/2021 FINDINGS: Cardiovascular: There subsegmental pulmonary emboli within the bilateral lower lobes and left upper lobe. There is no central pulmonary embolism. Heart and aorta are normal in size. There are atherosclerotic calcifications of the aorta. There is no pericardial effusion. Mediastinum/Nodes: No enlarged mediastinal, hilar, or axillary lymph nodes. Thyroid gland, trachea, and esophagus demonstrate no significant findings. Lungs/Pleura: Mild paraseptal emphysematous changes are present. There is no focal lung infiltrate, pleural effusion or pneumothorax. Upper Abdomen: No acute abnormality. Musculoskeletal: No chest wall abnormality. No acute or significant osseous findings. Bilateral gynecomastia again seen. Review of the MIP images confirms the above findings. IMPRESSION: 1. Subsegmental pulmonary emboli within the bilateral lower lobes and left upper  lobe. No central pulmonary embolism. Positive for acute PE with CT evidence of right heart strain (RV/LV Ratio = 1.1) consistent with at least submassive (intermediate risk) PE. The presence of right heart strain has been associated with an increased risk of morbidity and mortality. Aortic Atherosclerosis (ICD10-I70.0) and Emphysema (ICD10-J43.9). These results were called by telephone at the time of interpretation on 05/09/2022 at 11:59 pm to provider [facility] , who verbally acknowledged these results. Electronically Signed   By: Darliss Cheney M.D.   On: 05/09/2022 23:59   DG Chest Portable 1 View  Result Date: 05/09/2022 CLINICAL DATA:  Shortness of breath. EXAM: PORTABLE CHEST 1 VIEW COMPARISON:  November 09, 2021 FINDINGS: The heart size and mediastinal contours are within normal limits. The lungs are hyperinflated. Mild, diffusely increased interstitial lung markings are noted. This is slightly more prominent within the bilateral lung bases. There is no evidence of focal consolidation, pleural effusion or pneumothorax. The visualized skeletal structures are unremarkable. IMPRESSION: 1. Findings likely consistent with COPD. 2. Mild bibasilar atelectasis versus early infiltrate. Electronically Signed   By: Aram Candela M.D.   On: 05/09/2022 22:01    EKG: Independently reviewed. See above   Assessment/Plan   Pulmonary Embolus -in setting of hormone therapy  - d/c estrogen  - continue on heparin drip -echo to evaluate right heart strain  -of note CE negative   Acute COPD exacerbation   - solumedrol iv,taper to prednisone  - doxycycline -f/u on sputum cultures  - nebs standing and prn  -resume chronic inhalers  -pulmonary toilet   Buerger's disease --patient follows with dermatology  -patient has quit tobacco  -also followed by Dr Wyn Quaker  CAD s/p NSTEMI  s/p DES 2023  -continue asa ,statin, zetia    DMII  Neuropathy  -iss/fs    HLD -continue statin /zetia    HTN  Neurogenic bladder  -continue oxybutynin    Neuropathy   PUD -ppi   OSA   DVT prophylaxis: heparin drip Code Status: Full Family Communication: none at bedside Disposition Plan: patient  expected to be admitted greater than 2 midnights  Consults called:  n/a Admission status: progressive   Lurline Del MD [facility]   If 7PM-7AM, please contact night-coverage [email or web address] Password [facility]  05/10/2022, 12:16 AM

## 2022-05-10 NOTE — Progress Notes (Signed)
ANTICOAGULATION CONSULT NOTE  Pharmacy Consult for IV Heparin  Indication: pulmonary embolus  Patient Measurements: Height: 5\' 8"  (172.7 cm) Weight: 120.2 kg (265 lb) IBW/kg (Calculated) : 68.4 Heparin Dosing Weight:  96.7 kg   Labs: Recent Labs    05/09/22 2141 05/10/22 0003 05/10/22 0108 05/10/22 0708 05/10/22 1251 05/10/22 1949  HGB 12.8*  --  13.1  --   --   --   HCT 40.5  --  40.5  --   --   --   PLT 241  --  232  --   --   --   HEPARINUNFRC  --   --   --  0.33 0.27* 0.30  CREATININE 1.13  --  1.10  --   --   --   TROPONINIHS 4 3  --   --   --   --     Estimated Creatinine Clearance (by C-G formula based on SCr of 1.1 mg/dL) Male: 16.1 mL/min Male: 94.5 mL/min  Medical History: Past Medical History:  Diagnosis Date   Carpal tunnel syndrome    bilateral   COPD (chronic obstructive pulmonary disease) (HCC)    Coronary artery disease    Diabetes mellitus without complication (HCC)    Endotracheally intubated 09/30/2020   Hyperlipidemia    Hypertension    Neuropathy    On mechanically assisted ventilation (HCC) 09/30/2020   Peptic ulcer    Sleep apnea     Assessment: Pharmacy consulted to dose heparin in this 56 year old male (trans male) admitted with PE. Pt received heparin 4000 units IV x 1 in ED on 4/28 at 0016, no other prior anticoag noted.   0428 0708 HL 0.33, therapeutic x 1; 1600 un/hr 0428 1251 HL 0.27, subtherapeutic; 1600 un/hr  Goal of Therapy:  Heparin level 0.3-0.7 units/ml Monitor platelets by anticoagulation protocol: Yes   Plan: heparin level therapeutic x 1 --Continue heparin infusion 1800 units/hr --Re-check HL in 6 hours --Daily CBC per protocol while on IV heparin  Jaynie Bream 05/10/2022,8:56 PM

## 2022-05-11 ENCOUNTER — Other Ambulatory Visit (HOSPITAL_COMMUNITY): Payer: Self-pay

## 2022-05-11 DIAGNOSIS — I2699 Other pulmonary embolism without acute cor pulmonale: Secondary | ICD-10-CM | POA: Diagnosis not present

## 2022-05-11 DIAGNOSIS — F332 Major depressive disorder, recurrent severe without psychotic features: Secondary | ICD-10-CM

## 2022-05-11 LAB — CBC
HCT: 42.5 % (ref 39.0–52.0)
Hemoglobin: 13.6 g/dL (ref 13.0–17.0)
MCH: 26.2 pg (ref 26.0–34.0)
MCHC: 32 g/dL (ref 30.0–36.0)
MCV: 81.9 fL (ref 80.0–100.0)
Platelets: 277 10*3/uL (ref 150–400)
RBC: 5.19 MIL/uL (ref 4.22–5.81)
RDW: 15 % (ref 11.5–15.5)
WBC: 17.8 10*3/uL — ABNORMAL HIGH (ref 4.0–10.5)
nRBC: 0 % (ref 0.0–0.2)

## 2022-05-11 LAB — GLUCOSE, CAPILLARY
Glucose-Capillary: 131 mg/dL — ABNORMAL HIGH (ref 70–99)
Glucose-Capillary: 111 mg/dL — ABNORMAL HIGH (ref 70–99)
Glucose-Capillary: 126 mg/dL — ABNORMAL HIGH (ref 70–99)
Glucose-Capillary: 193 mg/dL — ABNORMAL HIGH (ref 70–99)
Glucose-Capillary: 143 mg/dL — ABNORMAL HIGH (ref 70–99)

## 2022-05-11 LAB — BASIC METABOLIC PANEL
Anion gap: 9 (ref 5–15)
BUN: 20 mg/dL (ref 6–20)
CO2: 25 mmol/L (ref 22–32)
Calcium: 9.5 mg/dL (ref 8.9–10.3)
Chloride: 102 mmol/L (ref 98–111)
Creatinine, Ser: 0.99 mg/dL (ref 0.61–1.24)
GFR, Estimated: >60 mL/min (ref >60)
Glucose, Bld: 146 mg/dL — ABNORMAL HIGH (ref 70–99)
Potassium: 4.8 mmol/L (ref 3.5–5.1)
Sodium: 136 mmol/L (ref 135–145)

## 2022-05-11 LAB — MAGNESIUM: Magnesium: 2 mg/dL (ref 1.7–2.4)

## 2022-05-11 LAB — HEPARIN LEVEL (UNFRACTIONATED): Heparin Unfractionated: 0.32 IU/mL (ref 0.30–0.70)

## 2022-05-11 MED ORDER — ESCITALOPRAM OXALATE 10 MG PO TABS
20.0000 mg | ORAL_TABLET | Freq: Every day | ORAL | Status: DC
  Administered 2022-05-11 – 2022-05-13 (×3): 20 mg via ORAL
  Filled 2022-05-11 (×3): qty 2

## 2022-05-11 MED ORDER — FAMOTIDINE 20 MG PO TABS
40.0000 mg | ORAL_TABLET | Freq: Every day | ORAL | Status: DC
  Administered 2022-05-12 – 2022-05-13 (×2): 40 mg via ORAL
  Filled 2022-05-11 (×2): qty 2

## 2022-05-11 MED ORDER — PANTOPRAZOLE SODIUM 40 MG PO TBEC
40.0000 mg | DELAYED_RELEASE_TABLET | Freq: Two times a day (BID) | ORAL | Status: DC
  Administered 2022-05-11 – 2022-05-13 (×4): 40 mg via ORAL
  Filled 2022-05-11 (×4): qty 1

## 2022-05-11 MED ORDER — SIMETHICONE 80 MG PO CHEW: 125.0000 mg | CHEWABLE_TABLET | Freq: Four times a day (QID) | ORAL | Status: DC | PRN

## 2022-05-11 MED ORDER — ARIPIPRAZOLE 5 MG PO TABS
5.0000 mg | ORAL_TABLET | Freq: Every day | ORAL | Status: DC
  Administered 2022-05-11 – 2022-05-13 (×3): 5 mg via ORAL
  Filled 2022-05-11 (×3): qty 1

## 2022-05-11 MED ORDER — IPRATROPIUM-ALBUTEROL 0.5-2.5 (3) MG/3ML IN SOLN
3.0000 mL | Freq: Three times a day (TID) | RESPIRATORY_TRACT | Status: DC
  Administered 2022-05-11 – 2022-05-13 (×6): 3 mL via RESPIRATORY_TRACT
  Filled 2022-05-11 (×6): qty 3

## 2022-05-11 MED ORDER — METHYLPREDNISOLONE SODIUM SUCC 40 MG IJ SOLR
40.0000 mg | Freq: Two times a day (BID) | INTRAMUSCULAR | Status: DC
  Administered 2022-05-11 – 2022-05-13 (×5): 40 mg via INTRAVENOUS
  Filled 2022-05-11 (×5): qty 1

## 2022-05-11 MED ORDER — OXYBUTYNIN CHLORIDE 5 MG PO TABS
5.0000 mg | ORAL_TABLET | Freq: Two times a day (BID) | ORAL | Status: DC
  Administered 2022-05-11 – 2022-05-13 (×4): 5 mg via ORAL
  Filled 2022-05-11 (×4): qty 1

## 2022-05-11 MED ORDER — LORATADINE 10 MG PO TABS
10.0000 mg | ORAL_TABLET | Freq: Every day | ORAL | Status: DC
  Administered 2022-05-11 – 2022-05-13 (×3): 10 mg via ORAL
  Filled 2022-05-11 (×3): qty 1

## 2022-05-11 NOTE — TOC Benefit Eligibility Note (Signed)
Patient Product/process development scientist completed.    The patient is currently admitted and upon discharge could be taking Eliquis 5 mg.  The current 30 day co-pay is $0.00.   The patient is currently admitted and upon discharge could be taking Xarelto 20 mg.  The current 30 day co-pay is $0.00.   The patient is insured through [employer] Part D   This test claim was processed through [facility]- copay amounts may vary at other pharmacies due to pharmacy/plan contracts, or as the patient moves through the different stages of their insurance plan.  Roland Earl, CPHT Pharmacy Patient Advocate Specialist [facility] Patient Advocate Team Direct Number: [phone]  Fax: [phone]

## 2022-05-11 NOTE — Progress Notes (Signed)
PROGRESS NOTE    Doristine Devoid.  WJX:[id number] DOB: 06-05-1965 DOA: 05/09/2022 PCP: Jodi Marble, NP   Brief Narrative:  57 year old with history of COPD, CAD, DM2, HLD, HTN, neuropathy, PUD, OSA comes to the ED with complaints of shortness of breath ongoing for 2 days despite of using home bronchodilators.  In the ER CTA chest showed subsegmental pulmonary embolism and bilateral upper and lower lobes and started on heparin drip.  Patient also positive for rhinovirus infection.  Lower extremity Dopplers negative for DVT   Assessment & Plan:  Principal Problem:   PE (pulmonary thromboembolism) (HCC) Active Problems:   DDD (degenerative disc disease), lumbar   Morbid obesity with BMI of 45.0-49.9, adult (HCC)   DM2 (diabetes mellitus, type 2) (HCC)   Hyperlipidemia   Type 2 diabetes mellitus with peripheral neuropathy (HCC)    Acute bilateral pulmonary embolism with cor pulmonale -Suspect in the setting of hormonal therapy.  Estrogen discontinued.  Currently on heparin drip, eventually will be on Eliquis - Echocardiogram-pending  - Lower extremity Dopplers-negative  Acute COPD exacerbation Rhinovirus infection - Quite a bit of wheezing, IV Solu-Medrol.  Bronchodilators.  I-S/flutter valve.  Empiric doxycycline, continue and complete 5-day course  History of CAD status post DC in 2003 Hyperlipidemia - On aspirin, statin and Zetia  Diabetes mellitus type 2 with peripheral neuropathy - Sliding scale and Accu-Cheks.  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  Peptic ulcer disease -PPI  Obstructive sleep apnea  DVT prophylaxis: Heparin drip Code Status: Full code Family Communication:   Status is: Inpatient Continue hospital stay for treatment of pulmonary embolism Still has significant abnormal breath sounds     Diet Orders  (From admission, onward)     Start     Ordered   05/10/22 0048  Diet heart healthy/carb modified Room service appropriate? Yes; Fluid consistency: Thin  Diet effective now       Question Answer Comment  Diet-HS Snack? Nothing   Room service appropriate? Yes   Fluid consistency: Thin      05/10/22 0050            Subjective: Seen  at bedside, tells me shortness of breath is slightly worse today and is wheezing more.  Also reports of some coughing.  Examination: Constitutional: Not in acute distress Respiratory: diffuse exp wheezing Cardiovascular: Normal sinus rhythm, no rubs Abdomen: Nontender nondistended good bowel sounds Musculoskeletal: No edema noted Skin: No rashes seen Neurologic: CN 2-12 grossly intact.  And nonfocal Psychiatric: Normal judgment and insight. Alert and oriented x 3. Normal mood.     Objective: Vitals:   05/10/22 2058 05/10/22 2152 05/11/22 0405 05/11/22 0600  BP:  (!) 152/61  (!) 152/112  Pulse:  (!) 101    Resp:  18  17  Temp:  97.7 F (36.5 C)  98.2 F (36.8 C)  TempSrc:    Oral  SpO2: 96% 94%    Weight:   121.2 kg   Height:        Intake/Output Summary (Last 24 hours) at 05/11/2022 0809 Last data filed at 05/11/2022 0705 Gross per 24 hour  Intake 600 ml  Output 4350 ml  Net -3750 ml   Filed Weights   05/10/22 0450 05/10/22 0600 05/11/22 0405  Weight: 123 kg 120.2 kg 121.2 kg    Scheduled Meds:  aspirin EC  81 mg Oral  Daily   atorvastatin  40 mg Oral Daily   budesonide (PULMICORT) nebulizer solution  0.5 mg Nebulization BID   doxycycline  100 mg Oral Q12H   ezetimibe  10 mg Oral Daily   gabapentin  600 mg Oral TID   guaiFENesin  600 mg Oral BID   insulin aspart  0-6 Units Subcutaneous TID WC   ipratropium-albuterol  3 mL Nebulization BID   predniSONE  40 mg Oral Q breakfast   Continuous Infusions:  heparin 1,800 Units/hr (05/11/22 0705)    Nutritional status     Body mass index is 40.63 kg/m.  Data Reviewed:    CBC: Recent Labs  Lab 05/09/22 2141 05/10/22 0108 05/11/22 0449  WBC 10.4 12.7* 17.8*  NEUTROABS 7.5  --   --   HGB 12.8* 13.1 13.6  HCT 40.5 40.5 42.5  MCV 81.7 81.8 81.9  PLT 241 232 277   Basic Metabolic Panel: Recent Labs  Lab 05/09/22 2141 05/10/22 0108 05/11/22 0449  NA 134* 135 136  K 4.2 4.4 4.8  CL 101 101 102  CO2 24 23 25   GLUCOSE 107* 146* 146*  BUN 17 18 20   CREATININE 1.13 1.10 0.99  CALCIUM 9.1 9.3 9.5  MG  --   --  2.0   GFR: Estimated Creatinine Clearance (by C-G formula based on SCr of 0.99 mg/dL) Male: 40.3 mL/min Male: 105.5 mL/min Liver Function Tests: Recent Labs  Lab 05/09/22 2141 05/10/22 0108  AST 22 18  ALT 12 14  ALKPHOS 77 83  BILITOT 0.6 0.4  PROT 6.8 7.3  ALBUMIN 3.4* 3.7   No results for input(s): "LIPASE", "AMYLASE" in the last 168 hours. No results for input(s): "AMMONIA" in the last 168 hours. Coagulation Profile: No results for input(s): "INR", "PROTIME" in the last 168 hours. Cardiac Enzymes: No results for input(s): "CKTOTAL", "CKMB", "CKMBINDEX", "TROPONINI" in the last 168 hours. BNP (last 3 results) No results for input(s): "PROBNP" in the last 8760 hours. HbA1C: Recent Labs    05/10/22 0708  HGBA1C 5.9*   CBG: Recent Labs  Lab 05/10/22 0803 05/10/22 1144 05/10/22 1623 05/11/22 0417  GLUCAP 194* 125* 177* 131*   Lipid Profile: No results for input(s): "CHOL", "HDL", "LDLCALC", "TRIG", "CHOLHDL", "LDLDIRECT" in the last 72 hours. Thyroid Function Tests: No results for input(s): "TSH", "T4TOTAL", "FREET4", "T3FREE", "THYROIDAB" in the last 72 hours. Anemia Panel: No results for input(s): "VITAMINB12", "FOLATE", "FERRITIN", "TIBC", "IRON", "RETICCTPCT" in the last 72 hours. Sepsis Labs: No results for input(s): "PROCALCITON", "LATICACIDVEN" in the last 168 hours.  Recent Results (from the past 240 hour(s))  Respiratory (~20 pathogens) panel by PCR     Status: Abnormal   Collection Time: 05/10/22   6:00 AM   Specimen: Nasopharyngeal Swab; Respiratory  Result Value Ref Range Status   Adenovirus NOT DETECTED NOT DETECTED Final   Coronavirus 229E NOT DETECTED NOT DETECTED Final    Comment: (NOTE) The Coronavirus on the Respiratory Panel, DOES NOT test for the novel  Coronavirus (2019 nCoV)    Coronavirus HKU1 NOT DETECTED NOT DETECTED Final   Coronavirus NL63 NOT DETECTED NOT DETECTED Final   Coronavirus OC43 NOT DETECTED NOT DETECTED Final   Metapneumovirus NOT DETECTED NOT DETECTED Final   Rhinovirus / Enterovirus DETECTED (A) NOT DETECTED Final   Influenza A NOT DETECTED NOT DETECTED Final   Influenza B NOT DETECTED NOT DETECTED Final   Parainfluenza Virus 1 NOT DETECTED NOT DETECTED Final   Parainfluenza Virus 2 NOT DETECTED NOT  DETECTED Final   Parainfluenza Virus 3 NOT DETECTED NOT DETECTED Final   Parainfluenza Virus 4 NOT DETECTED NOT DETECTED Final   Respiratory Syncytial Virus NOT DETECTED NOT DETECTED Final   Bordetella pertussis NOT DETECTED NOT DETECTED Final   Bordetella Parapertussis NOT DETECTED NOT DETECTED Final   Chlamydophila pneumoniae NOT DETECTED NOT DETECTED Final   Mycoplasma pneumoniae NOT DETECTED NOT DETECTED Final    Comment: Performed at [facility], [address] [place], [place] [address]         Radiology Studies: US Venous Img Lower Bilateral (DVT)  Result Date: 05/10/2022 CLINICAL DATA:  Pulmonary embolism EXAM: BILATERAL LOWER EXTREMITY VENOUS DOPPLER ULTRASOUND TECHNIQUE: Gray-scale sonography with compression, as well as color and duplex ultrasound, were performed to evaluate the deep venous system(s) from the level of the common femoral vein through the popliteal and proximal calf veins. COMPARISON:  07/22/2020 FINDINGS: VENOUS Normal compressibility of BILATERAL common femoral, superficial femoral, and popliteal veins, as well as the visualized calf veins. Visualized portions of BILATERAL profunda femoral vein and great saphenous  vein unremarkable. No filling defects to suggest DVT on grayscale or color Doppler imaging. Doppler waveforms show normal direction of venous flow, normal respiratory plasticity and response to augmentation. OTHER None. Limitations: none IMPRESSION: No evidence of deep venous thrombosis in either lower extremity. Electronically Signed   By: Ulyses Southward M.D.   On: 05/10/2022 15:42   CT Angio Chest PE W and/or Wo Contrast  Result Date: 05/09/2022 CLINICAL DATA:  Chest pain. EXAM: CT ANGIOGRAPHY CHEST WITH CONTRAST TECHNIQUE: Multidetector CT imaging of the chest was performed using the standard protocol during bolus administration of intravenous contrast. Multiplanar CT image reconstructions and MIPs were obtained to evaluate the vascular anatomy. RADIATION DOSE REDUCTION: This exam was performed according to the departmental dose-optimization program which includes automated exposure control, adjustment of the mA and/or kV according to patient size and/or use of iterative reconstruction technique. CONTRAST:  75mL OMNIPAQUE IOHEXOL 350 MG/ML SOLN COMPARISON:  CT angiogram chest 11/09/2021 FINDINGS: Cardiovascular: There subsegmental pulmonary emboli within the bilateral lower lobes and left upper lobe. There is no central pulmonary embolism. Heart and aorta are normal in size. There are atherosclerotic calcifications of the aorta. There is no pericardial effusion. Mediastinum/Nodes: No enlarged mediastinal, hilar, or axillary lymph nodes. Thyroid gland, trachea, and esophagus demonstrate no significant findings. Lungs/Pleura: Mild paraseptal emphysematous changes are present. There is no focal lung infiltrate, pleural effusion or pneumothorax. Upper Abdomen: No acute abnormality. Musculoskeletal: No chest wall abnormality. No acute or significant osseous findings. Bilateral gynecomastia again seen. Review of the MIP images confirms the above findings. IMPRESSION: 1. Subsegmental pulmonary emboli within the  bilateral lower lobes and left upper lobe. No central pulmonary embolism. Positive for acute PE with CT evidence of right heart strain (RV/LV Ratio = 1.1) consistent with at least submassive (intermediate risk) PE. The presence of right heart strain has been associated with an increased risk of morbidity and mortality. Aortic Atherosclerosis (ICD10-I70.0) and Emphysema (ICD10-J43.9). These results were called by telephone at the time of interpretation on 05/09/2022 at 11:59 pm to provider [facility] , who verbally acknowledged these results. Electronically Signed   By: Darliss Cheney M.D.   On: 05/09/2022 23:59   DG Chest Portable 1 View  Result Date: 05/09/2022 CLINICAL DATA:  Shortness of breath. EXAM: PORTABLE CHEST 1 VIEW COMPARISON:  November 09, 2021 FINDINGS: The heart size and mediastinal contours are within normal limits. The lungs are hyperinflated.  Mild, diffusely increased interstitial lung markings are noted. This is slightly more prominent within the bilateral lung bases. There is no evidence of focal consolidation, pleural effusion or pneumothorax. The visualized skeletal structures are unremarkable. IMPRESSION: 1. Findings likely consistent with COPD. 2. Mild bibasilar atelectasis versus early infiltrate. Electronically Signed   By: Aram Candela M.D.   On: 05/09/2022 22:01           LOS: 1 day   Time spent= 35 mins    Alyxis Grippi Joline Maxcy, MD [facility]  If 7PM-7AM, please contact night-coverage  05/11/2022, 8:09 AM

## 2022-05-11 NOTE — Progress Notes (Signed)
ANTICOAGULATION CONSULT NOTE  Pharmacy Consult for IV Heparin  Indication: pulmonary embolus  Patient Measurements: Height: 5\' 8"  (172.7 cm) Weight: 121.2 kg (267 lb 3.2 oz) IBW/kg (Calculated) : 68.4 Heparin Dosing Weight:  96.7 kg   Labs: Recent Labs    05/09/22 2141 05/10/22 0003 05/10/22 0108 05/10/22 0708 05/10/22 1251 05/10/22 1949 05/11/22 0449  HGB 12.8*  --  13.1  --   --   --  13.6  HCT 40.5  --  40.5  --   --   --  42.5  PLT 241  --  232  --   --   --  277  HEPARINUNFRC  --   --   --    < > 0.27* 0.30 0.32  CREATININE 1.13  --  1.10  --   --   --  0.99  TROPONINIHS 4 3  --   --   --   --   --    < > = values in this interval not displayed.    Estimated Creatinine Clearance (by C-G formula based on SCr of 0.99 mg/dL) Male: 16.1 mL/min Male: 105.5 mL/min  Medical History: Past Medical History:  Diagnosis Date   Carpal tunnel syndrome    bilateral   COPD (chronic obstructive pulmonary disease) (HCC)    Coronary artery disease    Diabetes mellitus without complication (HCC)    Endotracheally intubated 09/30/2020   Hyperlipidemia    Hypertension    Neuropathy    On mechanically assisted ventilation (HCC) 09/30/2020   Peptic ulcer    Sleep apnea     Assessment: Pharmacy consulted to dose heparin in this 57 year old male (trans male) admitted with PE. Pt received heparin 4000 units IV x 1 in ED on 4/28 at 0016, no other prior anticoag noted.   0428 0708 HL 0.33, therapeutic x 1; 1600 un/hr 0428 1251 HL 0.27, subtherapeutic; 1600 un/hr 0428 1249 HL 0.30, therapeutic X 1  0429 0449 HL 0.32, therapeutic X 2   Goal of Therapy:  Heparin level 0.3-0.7 units/ml Monitor platelets by anticoagulation protocol: Yes   Plan:  04/29:  HL @ 0449 = 0.32, therapeutic X 2 - Will continue pt on current rate and recheck HL on 4/30 with AM labs.  --Daily CBC per protocol while on IV heparin  Reneka Nebergall D 05/11/2022,5:52 AM

## 2022-05-12 ENCOUNTER — Inpatient Hospital Stay (HOSPITAL_COMMUNITY)
Admit: 2022-05-12 | Discharge: 2022-05-12 | Disposition: A | Discharge status: Home / Self Care | Payer: 59 | Attending: Internal Medicine | Admitting: Internal Medicine

## 2022-05-12 DIAGNOSIS — I2609 Other pulmonary embolism with acute cor pulmonale: Secondary | ICD-10-CM | POA: Diagnosis not present

## 2022-05-12 DIAGNOSIS — I2699 Other pulmonary embolism without acute cor pulmonale: Secondary | ICD-10-CM | POA: Diagnosis not present

## 2022-05-12 LAB — ECHOCARDIOGRAM COMPLETE
AR max vel: 2.3 cm2
AV Area VTI: 2.6 cm2
AV Area mean vel: 2.23 cm2
AV Mean grad: 5 mmHg
AV Peak grad: 9.9 mmHg
Ao pk vel: 1.57 m/s
Area-P 1/2: 3.4 cm2
Height: 68 in
MV VTI: 2.89 cm2
S' Lateral: 3.3 cm
Weight: 4284.8 oz

## 2022-05-12 LAB — CBC
HCT: 41.8 % (ref 39.0–52.0)
Hemoglobin: 13.4 g/dL (ref 13.0–17.0)
MCH: 26.3 pg (ref 26.0–34.0)
MCHC: 32.1 g/dL (ref 30.0–36.0)
MCV: 82 fL (ref 80.0–100.0)
Platelets: 271 10*3/uL (ref 150–400)
RBC: 5.1 MIL/uL (ref 4.22–5.81)
RDW: 15.1 % (ref 11.5–15.5)
WBC: 16.4 10*3/uL — ABNORMAL HIGH (ref 4.0–10.5)
nRBC: 0 % (ref 0.0–0.2)

## 2022-05-12 LAB — MAGNESIUM: Magnesium: 2 mg/dL (ref 1.7–2.4)

## 2022-05-12 LAB — BASIC METABOLIC PANEL
Anion gap: 11 (ref 5–15)
BUN: 22 mg/dL — ABNORMAL HIGH (ref 6–20)
CO2: 23 mmol/L (ref 22–32)
Calcium: 9.3 mg/dL (ref 8.9–10.3)
Chloride: 101 mmol/L (ref 98–111)
Creatinine, Ser: 0.98 mg/dL (ref 0.61–1.24)
GFR, Estimated: >60 mL/min (ref >60)
Glucose, Bld: 145 mg/dL — ABNORMAL HIGH (ref 70–99)
Potassium: 4.6 mmol/L (ref 3.5–5.1)
Sodium: 135 mmol/L (ref 135–145)

## 2022-05-12 LAB — GLUCOSE, CAPILLARY
Glucose-Capillary: 132 mg/dL — ABNORMAL HIGH (ref 70–99)
Glucose-Capillary: 131 mg/dL — ABNORMAL HIGH (ref 70–99)
Glucose-Capillary: 167 mg/dL — ABNORMAL HIGH (ref 70–99)
Glucose-Capillary: 140 mg/dL — ABNORMAL HIGH (ref 70–99)

## 2022-05-12 LAB — HEPARIN LEVEL (UNFRACTIONATED): Heparin Unfractionated: 0.28 IU/mL — ABNORMAL LOW (ref 0.30–0.70)

## 2022-05-12 LAB — BRAIN NATRIURETIC PEPTIDE: B Natriuretic Peptide: 37.3 pg/mL (ref 0.0–100.0)

## 2022-05-12 MED ORDER — HEPARIN BOLUS VIA INFUSION
1500.0000 [IU] | Freq: Once | INTRAVENOUS | Status: AC
  Administered 2022-05-12: 1500 [IU] via INTRAVENOUS
  Filled 2022-05-12: qty 1500

## 2022-05-12 MED ORDER — APIXABAN 5 MG PO TABS: 5.0000 mg | ORAL_TABLET | Freq: Two times a day (BID) | ORAL | Status: DC

## 2022-05-12 MED ORDER — APIXABAN 5 MG PO TABS
10.0000 mg | ORAL_TABLET | Freq: Two times a day (BID) | ORAL | Status: DC
  Administered 2022-05-12 – 2022-05-13 (×3): 10 mg via ORAL
  Filled 2022-05-12 (×3): qty 2

## 2022-05-12 NOTE — TOC Initial Note (Signed)
Transition of Care (TOC) - Initial/Assessment Note    Patient Details  Name: [name]. MRN: [id number] Date of Birth: 1965-02-09  Transition of Care [facility]) CM/SW Contact:    Truddie Hidden, RN Phone Number: 05/12/2022, 11:51 AM  Clinical Narrative:                  Transition of Care [facility]) Screening Note   Patient Details  Name: [name]. Date of Birth: Mar 26, 1965   Transition of Care [facility]) CM/SW Contact:    Truddie Hidden, RN Phone Number: 05/12/2022, 11:51 AM    Transition of Care Department [facility]) has reviewed patient and no TOC needs have been identified at this time. We will continue to monitor patient advancement through interdisciplinary progression rounds. If new patient transition needs arise, please place a TOC consult.          Patient Goals and CMS Choice            Expected Discharge Plan and Services                                              Prior Living Arrangements/Services                       Activities of Daily Living Home Assistive Devices/Equipment: None ADL Screening (condition at time of admission) Patient's cognitive ability adequate to safely complete daily activities?: Yes Is the patient deaf or have difficulty hearing?: No Does the patient have difficulty seeing, even when wearing glasses/contacts?: No Does the patient have difficulty concentrating, remembering, or making decisions?: No Patient able to express need for assistance with ADLs?: Yes Does the patient have difficulty dressing or bathing?: No Independently performs ADLs?: Yes (appropriate for developmental age) Does the patient have difficulty walking or climbing stairs?: No Weakness of Legs: Both Weakness of Arms/Hands: None  Permission Sought/Granted                  Emotional Assessment              Admission diagnosis:  PE (pulmonary thromboembolism) (HCC) [I26.99] COPD exacerbation (HCC) [J44.1] Acute  pulmonary embolism, unspecified pulmonary embolism type, unspecified whether acute cor pulmonale present (HCC) [I26.99] Patient Active Problem List   Diagnosis Date Noted   Major depressive disorder, recurrent severe without psychotic features (HCC) 05/11/2022   PE (pulmonary thromboembolism) (HCC) 05/10/2022   Non-ST elevation (NSTEMI) myocardial infarction (HCC)    Stable angina 11/09/2021   COPD with acute exacerbation (HCC) 08/26/2021   COPD exacerbation (HCC) 08/25/2021   Dyslipidemia 08/25/2021   GERD without esophagitis 08/25/2021   Type 2 diabetes mellitus with peripheral neuropathy (HCC) 08/25/2021   Acute respiratory failure with hypoxia (HCC) 08/25/2021   Overdose 09/30/2020   Suicidal ideation 09/30/2020   Acute on chronic respiratory failure with hypoxia (HCC) 09/30/2020   AKI (acute kidney injury) (HCC) 09/30/2020   Acute encephalopathy 09/30/2020   Lactic acidosis 09/30/2020   Depression 09/30/2020   Venous stasis dermatitis of both lower extremities 08/17/2019   Pain due to onychomycosis of toenails of both feet 05/15/2019   Chronic low back pain (1ry area of Pain) (Bilateral) w/o sciatica 03/09/2019   Bilateral lower leg cellulitis 06/27/2018   Osteoarthritis of facet joint of lumbar spine 02/14/2018   Osteoarthritis involving multiple joints 02/14/2018  Chronic musculoskeletal pain 02/14/2018   Neurogenic pain 02/14/2018   Abnormal MRI, lumbar spine 02/14/2018   History of allergy to shellfish 01/27/2018   Morbid obesity with BMI of 45.0-49.9, adult (HCC) 12/13/2017   Swelling of limb 11/26/2017   Lower limb ulcer, calf, left, limited to breakdown of skin (HCC) 11/26/2017   DM2 (diabetes mellitus, type 2) (HCC) 11/03/2017   Neurogenic bladder 11/03/2017   Spondylosis without myelopathy or radiculopathy, lumbar region 11/03/2017   Lumbar facet syndrome (Bilateral) (R>L) 11/03/2017   DDD (degenerative disc disease), lumbar 11/02/2017   Lumbar facet hypertrophy  (Bilateral) 11/02/2017   Lumbar central spinal stenosis (L4-5) 11/02/2017   Lumbar lateral recess stenosis (L4-5) (Bilateral) (L>R) 11/02/2017   Vitamin D insufficiency 11/02/2017   Lumbar spondylosis 11/02/2017   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   Grief 10/18/2017   Chronic low back pain (Bilateral) (R>L) w/ sciatica (Bilateral) 10/07/2017   Chronic lower extremity pain (Secondary Area of Pain) (Bilateral) (R>L) 10/07/2017   Chronic pain syndrome 10/07/2017   Pharmacologic therapy 10/07/2017   Disorder of skeletal system 10/07/2017   Problems influencing health status 10/07/2017   Barrett's esophagus without dysplasia 03/10/2017   Colon cancer screening 10/14/2016   Essential hypertension 02/11/2016   Hyperlipidemia 02/11/2016   Tobacco dependence 02/11/2016   Obesity 02/11/2016   Lower limb ulcer, ankle, left, with fat layer exposed (HCC) 02/11/2016   Lymphedema 02/11/2016   Cellulitis 01/06/2016   PCP:  Jodi Marble, NP Pharmacy:   [facility] - [place], [place] - [address] [place] [address] Phone: [phone] Fax: [phone]  [facility] [address] [place] - [address] [place] [address] Phone: [phone] Fax: [phone]  [facility] [place], [place] - [address] [place] [address] Phone: [phone] Fax: [phone]     Social Determinants of Health (SDOH) Social History: SDOH Screenings   Food Insecurity: No Food Insecurity (05/10/2022)  Housing: Low Risk  (05/10/2022)  Transportation Needs: Unmet Transportation Needs (05/10/2022)  Utilities: At Risk (05/10/2022)  Depression (PHQ2-9): High Risk (12/09/2020)  Tobacco Use: High Risk (05/09/2022)   SDOH Interventions: Housing Interventions: Intervention Not Indicated   Readmission Risk Interventions     No data to display

## 2022-05-12 NOTE — Progress Notes (Signed)
ANTICOAGULATION CONSULT NOTE  Pharmacy Consult for Apixaban Indication: pulmonary embolus  Patient Measurements: Height: 5\' 8"  (172.7 cm) Weight: 121.5 kg (267 lb 12.8 oz) IBW/kg (Calculated) : 68.4 Heparin Dosing Weight:  96.7 kg   Labs: Recent Labs    05/09/22 2141 05/10/22 0003 05/10/22 0108 05/10/22 0708 05/10/22 1949 05/11/22 0449 05/12/22 0649  HGB 12.8*  --  13.1  --   --  13.6 13.4  HCT 40.5  --  40.5  --   --  42.5 41.8  PLT 241  --  232  --   --  277 271  HEPARINUNFRC  --   --   --    < > 0.30 0.32 0.28*  CREATININE 1.13  --  1.10  --   --  0.99 0.98  TROPONINIHS 4 3  --   --   --   --   --    < > = values in this interval not displayed.    Estimated Creatinine Clearance (by C-G formula based on SCr of 0.98 mg/dL) Male: 13.0 mL/min Male: 106.7 mL/min  Medical History: Past Medical History:  Diagnosis Date   Carpal tunnel syndrome    bilateral   COPD (chronic obstructive pulmonary disease) (HCC)    Coronary artery disease    Diabetes mellitus without complication (HCC)    Endotracheally intubated 09/30/2020   Hyperlipidemia    Hypertension    Neuropathy    On mechanically assisted ventilation (HCC) 09/30/2020   Peptic ulcer    Sleep apnea     Assessment: Pharmacy consulted to dose heparin in this 57 year old male (trans male) admitted with PE. Patient initially started on heparin and now plan is to transition to apixaban. Pharmacy consulted for apixaban dosing for acute PE.   Plan:  --Discontinue IV heparin --Start apixaban 10 mg BID x 7 days followed by apixaban 5 mg BID for remaining duration of therapy --CBC per protocol while inpatient  [name] 05/12/2022,1:28 PM

## 2022-05-12 NOTE — Progress Notes (Signed)
*  PRELIMINARY RESULTS* Echocardiogram 2D Echocardiogram has been performed.  Carolyne Fiscal 05/12/2022, 9:26 AM

## 2022-05-12 NOTE — Progress Notes (Signed)
PROGRESS NOTE    [name].  ZOX:[id number] DOB: 1965/03/27 DOA: 05/09/2022 PCP: Jodi Marble, NP   Brief Narrative:  57 year old with history of COPD, CAD, DM2, HLD, HTN, neuropathy, PUD, OSA comes to the ED with complaints of shortness of breath ongoing for 2 days despite of using home bronchodilators.  In the ER CTA chest showed subsegmental pulmonary embolism and bilateral upper and lower lobes and started on heparin drip.  Patient also positive for rhinovirus infection.  Lower extremity Dopplers negative for DVT.  Echocardiogram is still pending.  Still wheezing therefore on steroids and bronchodilators   Assessment & Plan:  Principal Problem:   PE (pulmonary thromboembolism) (HCC) Active Problems:   DDD (degenerative disc disease), lumbar   Morbid obesity with BMI of 45.0-49.9, adult (HCC)   DM2 (diabetes mellitus, type 2) (HCC)   Hyperlipidemia   Type 2 diabetes mellitus with peripheral neuropathy (HCC)   Major depressive disorder, recurrent severe without psychotic features (HCC)    Acute bilateral pulmonary embolism with cor pulmonale -Suspect in the setting of hormonal therapy.  Estrogen discontinued.  Currently on heparin drip, eventually will be on Eliquis once echocardiogram is resulted - Echocardiogram-still pending - Lower extremity Dopplers-negative  Acute COPD exacerbation Rhinovirus infection - Continues to have expiratory wheezing, continue IV Solu-Medrol.  Bronchodilators.  I-S/flutter valve.  Empiric doxycycline, continue and complete total 5-day course  History of CAD status post DC in 2003 Hyperlipidemia - On aspirin, statin and Zetia  Diabetes mellitus type 2 with peripheral neuropathy - Sliding scale and Accu-Cheks.  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  Peptic ulcer disease -PPI  Obstructive sleep  apnea  DVT prophylaxis: Heparin drip Code Status: Full code Family Communication:   Status is: Inpatient Continue heparin drip, pending echocardiogram and improvement in breath sounds.  Hopefully home in next 1-2 days     Diet Orders (From admission, onward)     Start     Ordered   05/10/22 0048  Diet heart healthy/carb modified Room service appropriate? Yes; Fluid consistency: Thin  Diet effective now       Question Answer Comment  Diet-HS Snack? Nothing   Room service appropriate? Yes   Fluid consistency: Thin      05/10/22 0050            Subjective: Breathing slightly better at rest but with exertion still gets little short of breath.  Admits of coughing and bringing up mucus/phlegm.  Examination: Constitutional: Not in acute distress Respiratory: Diffuse bilateral expiratory wheezing Cardiovascular: Normal sinus rhythm, no rubs Abdomen: Nontender nondistended good bowel sounds Musculoskeletal: No edema noted Skin: No rashes seen Neurologic: CN 2-12 grossly intact.  And nonfocal Psychiatric: Normal judgment and insight. Alert and oriented x 3. Normal mood.   Objective: Vitals:   05/12/22 0324 05/12/22 0447 05/12/22 0806 05/12/22 0842  BP: (!) 169/90   (!) 148/79  Pulse: 78  80 82  Resp: 18  18 (!) 25  Temp: 97.6 F (36.4 C)   97.7 F (36.5 C)  TempSrc: Oral     SpO2: 97%   97%  Weight:  121.5 kg    Height:        Intake/Output Summary (Last 24 hours) at 05/12/2022 0846 Last data filed at 05/12/2022 0324 Gross per 24 hour  Intake 780 ml  Output 2200 ml  Net -1420 ml  Filed Weights   05/10/22 0600 05/11/22 0405 05/12/22 0447  Weight: 120.2 kg 121.2 kg 121.5 kg    Scheduled Meds:  ARIPiprazole  5 mg Oral Daily   aspirin EC  81 mg Oral Daily   atorvastatin  40 mg Oral Daily   budesonide (PULMICORT) nebulizer solution  0.5 mg Nebulization BID   doxycycline  100 mg Oral Q12H   escitalopram  20 mg Oral Daily   ezetimibe  10 mg Oral Daily    famotidine  40 mg Oral Daily   gabapentin  600 mg Oral TID   guaiFENesin  600 mg Oral BID   heparin  1,500 Units Intravenous Once   insulin aspart  0-6 Units Subcutaneous TID WC   ipratropium-albuterol  3 mL Nebulization TID   loratadine  10 mg Oral Daily   methylPREDNISolone (SOLU-MEDROL) injection  40 mg Intravenous BID   oxybutynin  5 mg Oral Q12H   pantoprazole  40 mg Oral BID   Continuous Infusions:  heparin 1,800 Units/hr (05/11/22 1920)    Nutritional status     Body mass index is 40.72 kg/m.  Data Reviewed:   CBC: Recent Labs  Lab 05/09/22 2141 05/10/22 0108 05/11/22 0449 05/12/22 0649  WBC 10.4 12.7* 17.8* 16.4*  NEUTROABS 7.5  --   --   --   HGB 12.8* 13.1 13.6 13.4  HCT 40.5 40.5 42.5 41.8  MCV 81.7 81.8 81.9 82.0  PLT 241 232 277 271   Basic Metabolic Panel: Recent Labs  Lab 05/09/22 2141 05/10/22 0108 05/11/22 0449 05/12/22 0649  NA 134* 135 136 135  K 4.2 4.4 4.8 4.6  CL 101 101 102 101  CO2 24 23 25 23   GLUCOSE 107* 146* 146* 145*  BUN 17 18 20  22*  CREATININE 1.13 1.10 0.99 0.98  CALCIUM 9.1 9.3 9.5 9.3  MG  --   --  2.0 2.0   GFR: Estimated Creatinine Clearance (by C-G formula based on SCr of 0.98 mg/dL) Male: 16.1 mL/min Male: 106.7 mL/min Liver Function Tests: Recent Labs  Lab 05/09/22 2141 05/10/22 0108  AST 22 18  ALT 12 14  ALKPHOS 77 83  BILITOT 0.6 0.4  PROT 6.8 7.3  ALBUMIN 3.4* 3.7   No results for input(s): "LIPASE", "AMYLASE" in the last 168 hours. No results for input(s): "AMMONIA" in the last 168 hours. Coagulation Profile: No results for input(s): "INR", "PROTIME" in the last 168 hours. Cardiac Enzymes: No results for input(s): "CKTOTAL", "CKMB", "CKMBINDEX", "TROPONINI" in the last 168 hours. BNP (last 3 results) No results for input(s): "PROBNP" in the last 8760 hours. HbA1C: Recent Labs    05/10/22 0708  HGBA1C 5.9*   CBG: Recent Labs  Lab 05/11/22 0810 05/11/22 1213 05/11/22 1711  05/11/22 2154 05/12/22 0843  GLUCAP 111* 126* 193* 143* 132*   Lipid Profile: No results for input(s): "CHOL", "HDL", "LDLCALC", "TRIG", "CHOLHDL", "LDLDIRECT" in the last 72 hours. Thyroid Function Tests: No results for input(s): "TSH", "T4TOTAL", "FREET4", "T3FREE", "THYROIDAB" in the last 72 hours. Anemia Panel: No results for input(s): "VITAMINB12", "FOLATE", "FERRITIN", "TIBC", "IRON", "RETICCTPCT" in the last 72 hours. Sepsis Labs: No results for input(s): "PROCALCITON", "LATICACIDVEN" in the last 168 hours.  Recent Results (from the past 240 hour(s))  Respiratory (~20 pathogens) panel by PCR     Status: Abnormal   Collection Time: 05/10/22  6:00 AM   Specimen: Nasopharyngeal Swab; Respiratory  Result Value Ref Range Status   Adenovirus NOT DETECTED NOT DETECTED Final  Coronavirus 229E NOT DETECTED NOT DETECTED Final    Comment: (NOTE) The Coronavirus on the Respiratory Panel, DOES NOT test for the novel  Coronavirus (2019 nCoV)    Coronavirus HKU1 NOT DETECTED NOT DETECTED Final   Coronavirus NL63 NOT DETECTED NOT DETECTED Final   Coronavirus OC43 NOT DETECTED NOT DETECTED Final   Metapneumovirus NOT DETECTED NOT DETECTED Final   Rhinovirus / Enterovirus DETECTED (A) NOT DETECTED Final   Influenza A NOT DETECTED NOT DETECTED Final   Influenza B NOT DETECTED NOT DETECTED Final   Parainfluenza Virus 1 NOT DETECTED NOT DETECTED Final   Parainfluenza Virus 2 NOT DETECTED NOT DETECTED Final   Parainfluenza Virus 3 NOT DETECTED NOT DETECTED Final   Parainfluenza Virus 4 NOT DETECTED NOT DETECTED Final   Respiratory Syncytial Virus NOT DETECTED NOT DETECTED Final   Bordetella pertussis NOT DETECTED NOT DETECTED Final   Bordetella Parapertussis NOT DETECTED NOT DETECTED Final   Chlamydophila pneumoniae NOT DETECTED NOT DETECTED Final   Mycoplasma pneumoniae NOT DETECTED NOT DETECTED Final    Comment: Performed at [facility], [address], [place], [place] [address]          Radiology Studies: US Venous Img Lower Bilateral (DVT)  Result Date: 05/10/2022 CLINICAL DATA:  Pulmonary embolism EXAM: BILATERAL LOWER EXTREMITY VENOUS DOPPLER ULTRASOUND TECHNIQUE: Gray-scale sonography with compression, as well as color and duplex ultrasound, were performed to evaluate the deep venous system(s) from the level of the common femoral vein through the popliteal and proximal calf veins. COMPARISON:  07/22/2020 FINDINGS: VENOUS Normal compressibility of BILATERAL common femoral, superficial femoral, and popliteal veins, as well as the visualized calf veins. Visualized portions of BILATERAL profunda femoral vein and great saphenous vein unremarkable. No filling defects to suggest DVT on grayscale or color Doppler imaging. Doppler waveforms show normal direction of venous flow, normal respiratory plasticity and response to augmentation. OTHER None. Limitations: none IMPRESSION: No evidence of deep venous thrombosis in either lower extremity. Electronically Signed   By: Ulyses Southward M.D.   On: 05/10/2022 15:42           LOS: 2 days   Time spent= 35 mins    Shone Leventhal Joline Maxcy, MD [facility]  If 7PM-7AM, please contact night-coverage  05/12/2022, 8:46 AM

## 2022-05-12 NOTE — Progress Notes (Signed)
ANTICOAGULATION CONSULT NOTE  Pharmacy Consult for IV Heparin  Indication: pulmonary embolus  Patient Measurements: Height: 5\' 8"  (172.7 cm) Weight: 121.5 kg (267 lb 12.8 oz) IBW/kg (Calculated) : 68.4 Heparin Dosing Weight:  96.7 kg   Labs: Recent Labs    05/09/22 2141 05/10/22 0003 05/10/22 0108 05/10/22 0708 05/10/22 1949 05/11/22 0449 05/12/22 0649  HGB 12.8*  --  13.1  --   --  13.6 13.4  HCT 40.5  --  40.5  --   --  42.5 41.8  PLT 241  --  232  --   --  277 271  HEPARINUNFRC  --   --   --    < > 0.30 0.32 0.28*  CREATININE 1.13  --  1.10  --   --  0.99 0.98  TROPONINIHS 4 3  --   --   --   --   --    < > = values in this interval not displayed.    Estimated Creatinine Clearance (by C-G formula based on SCr of 0.98 mg/dL) Male: 53.6 mL/min Male: 106.7 mL/min  Medical History: Past Medical History:  Diagnosis Date   Carpal tunnel syndrome    bilateral   COPD (chronic obstructive pulmonary disease) (HCC)    Coronary artery disease    Diabetes mellitus without complication (HCC)    Endotracheally intubated 09/30/2020   Hyperlipidemia    Hypertension    Neuropathy    On mechanically assisted ventilation (HCC) 09/30/2020   Peptic ulcer    Sleep apnea     Assessment: Pharmacy consulted to dose heparin in this 57 year old male (trans male) admitted with PE. Pt received heparin 4000 units IV x 1 in ED on 4/28 at 0016, no other prior anticoag noted.   0428 0708 HL 0.33, therapeutic x 1; 1600 un/hr 0428 1251 HL 0.27, subtherapeutic; 1600 un/hr 0428 1249 HL 0.30, therapeutic x 1; 1800 un/hr  0429 0449 HL 0.32, therapeutic x 2; 1800 un/hr 0430 0649 HL 0.28, subtherapeutic; 1800 un/hr   Goal of Therapy:  Heparin level 0.3-0.7 units/ml Monitor platelets by anticoagulation protocol: Yes   Plan:  --Heparin level is subtherapeutic --Heparin 1500 unit IV bolus and increase heparin infusion to 2000 units/hr --Re-check HL 6 hours from rate change --Daily CBC  per protocol while on IV heparin --Follow-up transition to DOAC when appropriate  [name] 05/12/2022,7:40 AM

## 2022-05-13 DIAGNOSIS — J441 Chronic obstructive pulmonary disease with (acute) exacerbation: Secondary | ICD-10-CM | POA: Diagnosis not present

## 2022-05-13 DIAGNOSIS — Z6841 Body Mass Index (BMI) 40.0 and over, adult: Secondary | ICD-10-CM

## 2022-05-13 DIAGNOSIS — I2699 Other pulmonary embolism without acute cor pulmonale: Secondary | ICD-10-CM | POA: Diagnosis not present

## 2022-05-13 LAB — BASIC METABOLIC PANEL
Anion gap: 8 (ref 5–15)
BUN: 24 mg/dL — ABNORMAL HIGH (ref 6–20)
CO2: 23 mmol/L (ref 22–32)
Calcium: 9.4 mg/dL (ref 8.9–10.3)
Chloride: 101 mmol/L (ref 98–111)
Creatinine, Ser: 1.09 mg/dL (ref 0.61–1.24)
GFR, Estimated: >60 mL/min (ref >60)
Glucose, Bld: 210 mg/dL — ABNORMAL HIGH (ref 70–99)
Potassium: 4.5 mmol/L (ref 3.5–5.1)
Sodium: 132 mmol/L — ABNORMAL LOW (ref 135–145)

## 2022-05-13 LAB — CBC
HCT: 45.7 % (ref 39.0–52.0)
Hemoglobin: 14.3 g/dL (ref 13.0–17.0)
MCH: 25.9 pg — ABNORMAL LOW (ref 26.0–34.0)
MCHC: 31.3 g/dL (ref 30.0–36.0)
MCV: 82.8 fL (ref 80.0–100.0)
Platelets: 294 10*3/uL (ref 150–400)
RBC: 5.52 MIL/uL (ref 4.22–5.81)
RDW: 14.9 % (ref 11.5–15.5)
WBC: 18.2 10*3/uL — ABNORMAL HIGH (ref 4.0–10.5)
nRBC: 0 % (ref 0.0–0.2)

## 2022-05-13 LAB — GLUCOSE, CAPILLARY
Glucose-Capillary: 146 mg/dL — ABNORMAL HIGH (ref 70–99)
Glucose-Capillary: 133 mg/dL — ABNORMAL HIGH (ref 70–99)

## 2022-05-13 LAB — MAGNESIUM: Magnesium: 2.1 mg/dL (ref 1.7–2.4)

## 2022-05-13 MED ORDER — APIXABAN 5 MG PO TABS: 5.0000 mg | ORAL_TABLET | Freq: Two times a day (BID) | ORAL | 0 refills | Status: DC

## 2022-05-13 MED ORDER — PREDNISONE 10 MG PO TABS: 40.0000 mg | ORAL_TABLET | Freq: Every day | ORAL | 0 refills | Status: AC

## 2022-05-13 MED ORDER — APIXABAN 5 MG PO TABS: 10.0000 mg | ORAL_TABLET | Freq: Two times a day (BID) | ORAL | 0 refills | Status: DC

## 2022-05-13 MED ORDER — IPRATROPIUM-ALBUTEROL 0.5-2.5 (3) MG/3ML IN SOLN: 3.0000 mL | Freq: Two times a day (BID) | RESPIRATORY_TRACT | Status: DC

## 2022-05-13 NOTE — TOC Transition Note (Signed)
Transition of Care [facility]) - CM/SW Discharge Note   Patient Details  Name: [name]. MRN: [id number] Date of Birth: Apr 08, 1965  [facility] CM/SW Contact:  Truddie Hidden, RN Phone Number: 05/13/2022, 3:10 PM   Clinical Narrative:    Patient discharging home.  Per nurse she does not have a ride.  Taxi voucher printed to the floor.   TOC signing off.          Patient Goals and CMS Choice      Discharge Placement                         Discharge Plan and Services Additional resources added to the After Visit Summary for                                       Social Determinants of Health (SDOH) Interventions SDOH Screenings   Food Insecurity: No Food Insecurity (05/10/2022)  Housing: Low Risk  (05/10/2022)  Transportation Needs: Unmet Transportation Needs (05/10/2022)  Utilities: At Risk (05/10/2022)  Depression (PHQ2-9): High Risk (12/09/2020)  Tobacco Use: High Risk (05/09/2022)     Readmission Risk Interventions     No data to display

## 2022-05-13 NOTE — Care Management Important Message (Signed)
Important Message  Patient Details  Name: [name]. MRN: [id number] Date of Birth: 03-17-1965   Medicare Important Message Given:  N/A - LOS <3 / Initial given by admissions     [name] 05/13/2022, 8:53 AM

## 2022-05-13 NOTE — Discharge Summary (Signed)
Physician Discharge Summary   Patient: [name]. MRN: [id number] DOB: [birth year]  Admit date:     05/09/2022  Discharge date: 05/13/22  Discharge Physician: Marrion Coy   PCP: Jodi Marble, NP   Recommendations at discharge:   With PCP in 1 week.  Discharge Diagnoses: Principal Problem:   PE (pulmonary thromboembolism) (HCC) Active Problems:   DDD (degenerative disc disease), lumbar   Morbid obesity with BMI of 45.0-49.9, adult (HCC)   DM2 (diabetes mellitus, type 2) (HCC)   Hyperlipidemia   Type 2 diabetes mellitus with peripheral neuropathy (HCC)   Major depressive disorder, recurrent severe without psychotic features (HCC) COPD exacerbation. Rhinovirus infection. Obstructive sleep apnea. Resolved Problems:   * No resolved hospital problems. *  Hospital Course: 57 year old with history of COPD, CAD, DM2, HLD, HTN, neuropathy, PUD, OSA comes to the ED with complaints of shortness of breath ongoing for 2 days despite of using home bronchodilators.  In the ER CTA chest showed subsegmental pulmonary embolism and bilateral upper and lower lobes and started on heparin drip.  Patient also positive for rhinovirus infection.  Lower extremity Dopplers negative for DVT.  Echocardiogram showed ejection fraction of 60 to 65% with normal diastolic parameters and normal right ventricular function..   Anticoagulation was switched to oral Eliquis on 4/30, short of breath has improved.  No wheezing today.  He was placed on 3 L oxygen at time of admission, but I reviewed the chart, patient did not have documented hypoxemia.  Will obtain oxygen evaluation at this time.  Patient also started on steroids for COPD exacerbation, will continue 3 more days of steroids.  Assessment and Plan: Acute bilateral pulmonary embolism with cor pulmonale Condition had improved, transition to oral Eliquis since yesterday.  Medically stable to be discharged. Discontinue estrogen.   Acute COPD  exacerbation Rhinovirus infection Continue prednisone for another 3 days, home oxygen evaluation. Patient does not seem to have bacterial pneumonia, discontinue antibiotics.   History of CAD status post DC in 2003 Hyperlipidemia - On aspirin, statin and Zetia   Diabetes mellitus type 2 with peripheral neuropathy Resume home treatment.   Essential hypertension Resume home treatment.   Neurogenic bladder -Supportive care   History of Buerger's disease  Follows with dermatology and vascular.   Peptic ulcer disease -PPI   Obstructive sleep apnea        Consultants: None Procedures performed: None  Disposition: Home Diet recommendation:  Discharge Diet Orders (From admission, onward)     Start     Ordered   05/13/22 0000  Diet - low sodium heart healthy        05/13/22 1118           Cardiac diet DISCHARGE MEDICATION: Allergies as of 05/13/2022       Reactions   Shellfish Allergy Swelling   Codeine Rash   Lisinopril Cough        Medication List     STOP taking these medications    estradiol 2 MG tablet Commonly known as: ESTRACE       TAKE these medications    albuterol 108 (90 Base) MCG/ACT inhaler Commonly known as: VENTOLIN HFA Inhale 2 puffs into the lungs every 4 (four) hours as needed for wheezing or shortness of breath.   apixaban 5 MG Tabs tablet Commonly known as: ELIQUIS Take 2 tablets (10 mg total) by mouth 2 (two) times daily for 6 days.   apixaban 5 MG Tabs tablet Commonly known as: ELIQUIS Take  1 tablet (5 mg total) by mouth 2 (two) times daily. Start taking on: May 19, 2022   ARIPiprazole 5 MG tablet Commonly known as: ABILIFY Take 5 mg by mouth daily.   aspirin EC 81 MG tablet Take 1 tablet (81 mg total) by mouth daily. Swallow whole.   aspirin-acetaminophen-caffeine 250-250-65 MG tablet Commonly known as: EXCEDRIN MIGRAINE Take by mouth every 6 (six) hours as needed for headache.   atorvastatin 40 MG  tablet Commonly known as: LIPITOR Take 40 mg by mouth daily.   Breztri Aerosphere 160-9-4.8 MCG/ACT Aero Generic drug: Budeson-Glycopyrrol-Formoterol Inhale 2 inhalations into the lungs 2 (two) times daily for 90 days   escitalopram 20 MG tablet Commonly known as: LEXAPRO Take 20 mg by mouth daily.   ezetimibe 10 MG tablet Commonly known as: ZETIA Take 1 tablet (10 mg total) by mouth daily.   famotidine 40 MG tablet Commonly known as: PEPCID Take 40 mg by mouth daily.   gabapentin 600 MG tablet Commonly known as: NEURONTIN Take 600 mg by mouth 3 (three) times daily.   hydrocortisone 2.5 % lotion APPLY TO AFFECTEDAREA RASH ON BUTTOCKS 3 DAYS PER WEEK. TUESDAY, THURSDAY, SATURDAY, AND AS NEEDED FOR FLARES   ipratropium-albuterol 0.5-2.5 (3) MG/3ML Soln Commonly known as: DUONEB Inhale 3 mLs into the lungs 4 (four) times daily as needed for wheezing.   loratadine 10 MG tablet Commonly known as: CLARITIN Take 10 mg by mouth daily.   meloxicam 15 MG tablet Commonly known as: MOBIC Take 1 tablet (15 mg total) by mouth daily.   metFORMIN 1000 MG tablet Commonly known as: GLUCOPHAGE Take 1,000 mg by mouth daily with breakfast.   oxybutynin 5 MG tablet Commonly known as: DITROPAN Take 5 mg by mouth every 12 (twelve) hours.   pantoprazole 40 MG tablet Commonly known as: Protonix Take 1 tablet (40 mg total) by mouth daily. What changed: when to take this   predniSONE 10 MG tablet Commonly known as: DELTASONE Take 4 tablets (40 mg total) by mouth daily for 3 days.   simethicone 125 MG chewable tablet Commonly known as: MYLICON Chew 125 mg by mouth every 6 (six) hours as needed for flatulence.   spironolactone 50 MG tablet Commonly known as: ALDACTONE Take 1.5 tablets (75 mg total) by mouth 2 (two) times daily. What changed: how much to take        Follow-up Information     Jodi Marble, NP Follow up in 1 week(s).   Specialty: Nurse Practitioner Contact  information: [address] [place] [address] [phone]                Discharge Exam: Filed Weights   05/11/22 0405 05/12/22 0447 05/13/22 0441  Weight: 121.2 kg 121.5 kg 121.4 kg   General exam: Appears calm and comfortable, morbidly obese. Respiratory system: Decreased breath sounds. Respiratory effort normal. Cardiovascular system: S1 & S2 heard, RRR. No JVD, murmurs, rubs, gallops or clicks. No pedal edema. Gastrointestinal system: Abdomen is nondistended, soft and nontender. No organomegaly or masses felt. Normal bowel sounds heard. Central nervous system: Alert and oriented. No focal neurological deficits. Extremities: Symmetric 5 x 5 power. Skin: No rashes, lesions or ulcers Psychiatry: Judgement and insight appear normal. Mood & affect appropriate.    Condition at discharge: good  The results of significant diagnostics from this hospitalization (including imaging, microbiology, ancillary and laboratory) are listed below for reference.   Imaging Studies: ECHOCARDIOGRAM COMPLETE  Result Date: 05/12/2022    ECHOCARDIOGRAM REPORT  Patient Name:   Lionel Woodberry. Date of Exam: 05/12/2022 Medical Rec #:  [id number]            Height:       68.0 in Accession #:    [id number]           Weight:       267.8 lb Date of Birth:  [birth year]            BSA:          2.314 m Patient Age:    56 years             BP:           169/91 mmHg Patient Gender: M                    HR:           78 bpm. Exam Location:  [facility] Procedure: 2D Echo, Cardiac Doppler and Color Doppler Indications:     Pulmonary emboulus  History:         Patient has prior history of Echocardiogram examinations, most                  recent 11/10/2021. Previous Myocardial Infarction and Angina,                  COPD; Risk Factors:Hypertension, Dyslipidemia, Current Smoker                  and Sleep Apnea.  Sonographer:     Mikki Harbor Referring Phys:  [id number] SARA-MAIZ A THOMAS Diagnosing Phys: Yvonne Kendall MD  Sonographer Comments: Patient is obese. IMPRESSIONS  1. Left ventricular ejection fraction, by estimation, is 60 to 65%. The left ventricle has normal function. Left ventricular endocardial border not optimally defined to evaluate regional wall motion. Left ventricular diastolic parameters were normal.  2. Right ventricular systolic function is normal. The right ventricular size is normal. Tricuspid regurgitation signal is inadequate for assessing PA pressure.  3. The mitral valve is grossly normal. No evidence of mitral valve regurgitation. No evidence of mitral stenosis.  4. The aortic valve was not well visualized. There is mild thickening of the aortic valve. Aortic valve regurgitation is not visualized. Aortic valve sclerosis is present, with no evidence of aortic valve stenosis.  5. The inferior vena cava is dilated in size with >50% respiratory variability, suggesting right atrial pressure of 8 mmHg. FINDINGS  Left Ventricle: Left ventricular ejection fraction, by estimation, is 60 to 65%. The left ventricle has normal function. Left ventricular endocardial border not optimally defined to evaluate regional wall motion. The left ventricular internal cavity size was normal in size. There is borderline left ventricular hypertrophy. Left ventricular diastolic parameters were normal. Right Ventricle: The right ventricular size is normal. No increase in right ventricular wall thickness. Right ventricular systolic function is normal. Tricuspid regurgitation signal is inadequate for assessing PA pressure. Left Atrium: Left atrial size was normal in size. Right Atrium: Right atrial size was normal in size. Pericardium: There is no evidence of pericardial effusion. Mitral Valve: The mitral valve is grossly normal. No evidence of mitral valve regurgitation. No evidence of mitral valve stenosis. MV peak gradient, 3.9 mmHg. The mean mitral valve gradient is 2.0 mmHg. Tricuspid Valve: The tricuspid valve is not  well visualized. Tricuspid valve regurgitation is not demonstrated. Aortic Valve: The aortic valve was not well visualized. There is mild thickening of the aortic valve. Aortic  valve regurgitation is not visualized. Aortic valve sclerosis is present, with no evidence of aortic valve stenosis. Aortic valve mean gradient measures 5.0 mmHg. Aortic valve peak gradient measures 9.9 mmHg. Aortic valve area, by VTI measures 2.60 cm. Pulmonic Valve: The pulmonic valve was not well visualized. Pulmonic valve regurgitation is trivial. No evidence of pulmonic stenosis. Aorta: The aortic root is normal in size and structure. Pulmonary Artery: The pulmonary artery is not well seen. Venous: The inferior vena cava is dilated in size with greater than 50% respiratory variability, suggesting right atrial pressure of 8 mmHg. IAS/Shunts: The interatrial septum appears to be lipomatous. No atrial level shunt detected by color flow Doppler.  LEFT VENTRICLE PLAX 2D LVIDd:         5.40 cm   Diastology LVIDs:         3.30 cm   LV e' medial:    8.92 cm/s LV PW:         1.10 cm   LV E/e' medial:  10.3 LV IVS:        1.10 cm   LV e' lateral:   9.46 cm/s LVOT diam:     2.00 cm   LV E/e' lateral: 9.7 LV SV:         77 LV SV Index:   33 LVOT Area:     3.14 cm  RIGHT VENTRICLE RV Basal diam:  3.60 cm RV Mid diam:    3.40 cm TAPSE (M-mode): 2.7 cm LEFT ATRIUM             Index        RIGHT ATRIUM           Index LA diam:        3.90 cm 1.69 cm/m   RA Area:     17.10 cm LA Vol (A2C):   41.4 ml 17.89 ml/m  RA Volume:   46.90 ml  20.27 ml/m LA Vol (A4C):   27.4 ml 11.84 ml/m LA Biplane Vol: 35.4 ml 15.30 ml/m  AORTIC VALVE                     PULMONIC VALVE AV Area (Vmax):    2.30 cm      PV Vmax:       1.54 m/s AV Area (Vmean):   2.23 cm      PV Peak grad:  9.5 mmHg AV Area (VTI):     2.60 cm AV Vmax:           157.00 cm/s AV Vmean:          104.000 cm/s AV VTI:            0.297 m AV Peak Grad:      9.9 mmHg AV Mean Grad:      5.0 mmHg  LVOT Vmax:         115.00 cm/s LVOT Vmean:        73.800 cm/s LVOT VTI:          0.246 m LVOT/AV VTI ratio: 0.83  AORTA Ao Root diam: 3.60 cm MITRAL VALVE MV Area (PHT): 3.40 cm    SHUNTS MV Area VTI:   2.89 cm    Systemic VTI:  0.25 m MV Peak grad:  3.9 mmHg    Systemic Diam: 2.00 cm MV Mean grad:  2.0 mmHg MV Vmax:       0.99 m/s MV Vmean:      69.7 cm/s MV Decel Time:  223 msec MV E velocity: 91.70 cm/s MV A velocity: 88.00 cm/s MV E/A ratio:  1.04 Christopher End MD Electronically signed by Yvonne Kendall MD Signature Date/Time: 05/12/2022/12:26:43 PM    Final    US Venous Img Lower Bilateral (DVT)  Result Date: 05/10/2022 CLINICAL DATA:  Pulmonary embolism EXAM: BILATERAL LOWER EXTREMITY VENOUS DOPPLER ULTRASOUND TECHNIQUE: Gray-scale sonography with compression, as well as color and duplex ultrasound, were performed to evaluate the deep venous system(s) from the level of the common femoral vein through the popliteal and proximal calf veins. COMPARISON:  07/22/2020 FINDINGS: VENOUS Normal compressibility of BILATERAL common femoral, superficial femoral, and popliteal veins, as well as the visualized calf veins. Visualized portions of BILATERAL profunda femoral vein and great saphenous vein unremarkable. No filling defects to suggest DVT on grayscale or color Doppler imaging. Doppler waveforms show normal direction of venous flow, normal respiratory plasticity and response to augmentation. OTHER None. Limitations: none IMPRESSION: No evidence of deep venous thrombosis in either lower extremity. Electronically Signed   By: Ulyses Southward M.D.   On: 05/10/2022 15:42   CT Angio Chest PE W and/or Wo Contrast  Result Date: 05/09/2022 CLINICAL DATA:  Chest pain. EXAM: CT ANGIOGRAPHY CHEST WITH CONTRAST TECHNIQUE: Multidetector CT imaging of the chest was performed using the standard protocol during bolus administration of intravenous contrast. Multiplanar CT image reconstructions and MIPs were obtained to  evaluate the vascular anatomy. RADIATION DOSE REDUCTION: This exam was performed according to the departmental dose-optimization program which includes automated exposure control, adjustment of the mA and/or kV according to patient size and/or use of iterative reconstruction technique. CONTRAST:  75mL OMNIPAQUE IOHEXOL 350 MG/ML SOLN COMPARISON:  CT angiogram chest 11/09/2021 FINDINGS: Cardiovascular: There subsegmental pulmonary emboli within the bilateral lower lobes and left upper lobe. There is no central pulmonary embolism. Heart and aorta are normal in size. There are atherosclerotic calcifications of the aorta. There is no pericardial effusion. Mediastinum/Nodes: No enlarged mediastinal, hilar, or axillary lymph nodes. Thyroid gland, trachea, and esophagus demonstrate no significant findings. Lungs/Pleura: Mild paraseptal emphysematous changes are present. There is no focal lung infiltrate, pleural effusion or pneumothorax. Upper Abdomen: No acute abnormality. Musculoskeletal: No chest wall abnormality. No acute or significant osseous findings. Bilateral gynecomastia again seen. Review of the MIP images confirms the above findings. IMPRESSION: 1. Subsegmental pulmonary emboli within the bilateral lower lobes and left upper lobe. No central pulmonary embolism. Positive for acute PE with CT evidence of right heart strain (RV/LV Ratio = 1.1) consistent with at least submassive (intermediate risk) PE. The presence of right heart strain has been associated with an increased risk of morbidity and mortality. Aortic Atherosclerosis (ICD10-I70.0) and Emphysema (ICD10-J43.9). These results were called by telephone at the time of interpretation on 05/09/2022 at 11:59 pm to provider [facility] , who verbally acknowledged these results. Electronically Signed   By: Darliss Cheney M.D.   On: 05/09/2022 23:59   DG Chest Portable 1 View  Result Date: 05/09/2022 CLINICAL DATA:  Shortness of breath. EXAM: PORTABLE CHEST 1  VIEW COMPARISON:  November 09, 2021 FINDINGS: The heart size and mediastinal contours are within normal limits. The lungs are hyperinflated. Mild, diffusely increased interstitial lung markings are noted. This is slightly more prominent within the bilateral lung bases. There is no evidence of focal consolidation, pleural effusion or pneumothorax. The visualized skeletal structures are unremarkable. IMPRESSION: 1. Findings likely consistent with COPD. 2. Mild bibasilar atelectasis versus early infiltrate. Electronically Signed   By: Demetrius Revel.D.  On: 05/09/2022 22:01    Microbiology: Results for orders placed or performed during the hospital encounter of 05/09/22  Respiratory (~20 pathogens) panel by PCR     Status: Abnormal   Collection Time: 05/10/22  6:00 AM   Specimen: Nasopharyngeal Swab; Respiratory  Result Value Ref Range Status   Adenovirus NOT DETECTED NOT DETECTED Final   Coronavirus 229E NOT DETECTED NOT DETECTED Final    Comment: (NOTE) The Coronavirus on the Respiratory Panel, DOES NOT test for the novel  Coronavirus (2019 nCoV)    Coronavirus HKU1 NOT DETECTED NOT DETECTED Final   Coronavirus NL63 NOT DETECTED NOT DETECTED Final   Coronavirus OC43 NOT DETECTED NOT DETECTED Final   Metapneumovirus NOT DETECTED NOT DETECTED Final   Rhinovirus / Enterovirus DETECTED (A) NOT DETECTED Final   Influenza A NOT DETECTED NOT DETECTED Final   Influenza B NOT DETECTED NOT DETECTED Final   Parainfluenza Virus 1 NOT DETECTED NOT DETECTED Final   Parainfluenza Virus 2 NOT DETECTED NOT DETECTED Final   Parainfluenza Virus 3 NOT DETECTED NOT DETECTED Final   Parainfluenza Virus 4 NOT DETECTED NOT DETECTED Final   Respiratory Syncytial Virus NOT DETECTED NOT DETECTED Final   Bordetella pertussis NOT DETECTED NOT DETECTED Final   Bordetella Parapertussis NOT DETECTED NOT DETECTED Final   Chlamydophila pneumoniae NOT DETECTED NOT DETECTED Final   Mycoplasma pneumoniae NOT DETECTED  NOT DETECTED Final    Comment: Performed at [facility], [address], [place], [place] [address]    Labs: CBC: Recent Labs  Lab 05/09/22 2141 05/10/22 0108 05/11/22 0449 05/12/22 0649 05/13/22 0416  WBC 10.4 12.7* 17.8* 16.4* 18.2*  NEUTROABS 7.5  --   --   --   --   HGB 12.8* 13.1 13.6 13.4 14.3  HCT 40.5 40.5 42.5 41.8 45.7  MCV 81.7 81.8 81.9 82.0 82.8  PLT 241 232 277 271 294   Basic Metabolic Panel: Recent Labs  Lab 05/09/22 2141 05/10/22 0108 05/11/22 0449 05/12/22 0649 05/13/22 0416  NA 134* 135 136 135 132*  K 4.2 4.4 4.8 4.6 4.5  CL 101 101 102 101 101  CO2 24 23 25 23 23   GLUCOSE 107* 146* 146* 145* 210*  BUN 17 18 20  22* 24*  CREATININE 1.13 1.10 0.99 0.98 1.09  CALCIUM 9.1 9.3 9.5 9.3 9.4  MG  --   --  2.0 2.0 2.1   Liver Function Tests: Recent Labs  Lab 05/09/22 2141 05/10/22 0108  AST 22 18  ALT 12 14  ALKPHOS 77 83  BILITOT 0.6 0.4  PROT 6.8 7.3  ALBUMIN 3.4* 3.7   CBG: Recent Labs  Lab 05/12/22 0843 05/12/22 1216 05/12/22 1636 05/12/22 2130 05/13/22 0744  GLUCAP 132* 131* 167* 140* 146*    Discharge time spent: greater than 30 minutes.  Signed: Marrion Coy, MD [facility] 05/13/2022

## 2022-07-07 IMAGING — DX DG CHEST 1V PORT
1 series · 1 of 1 positions shown · non-contrast
Comparison: 07/22/2020

CLINICAL DATA: Endotracheal tube and OG tube placements

EXAM:
PORTABLE CHEST 1 VIEW

[chest ap]
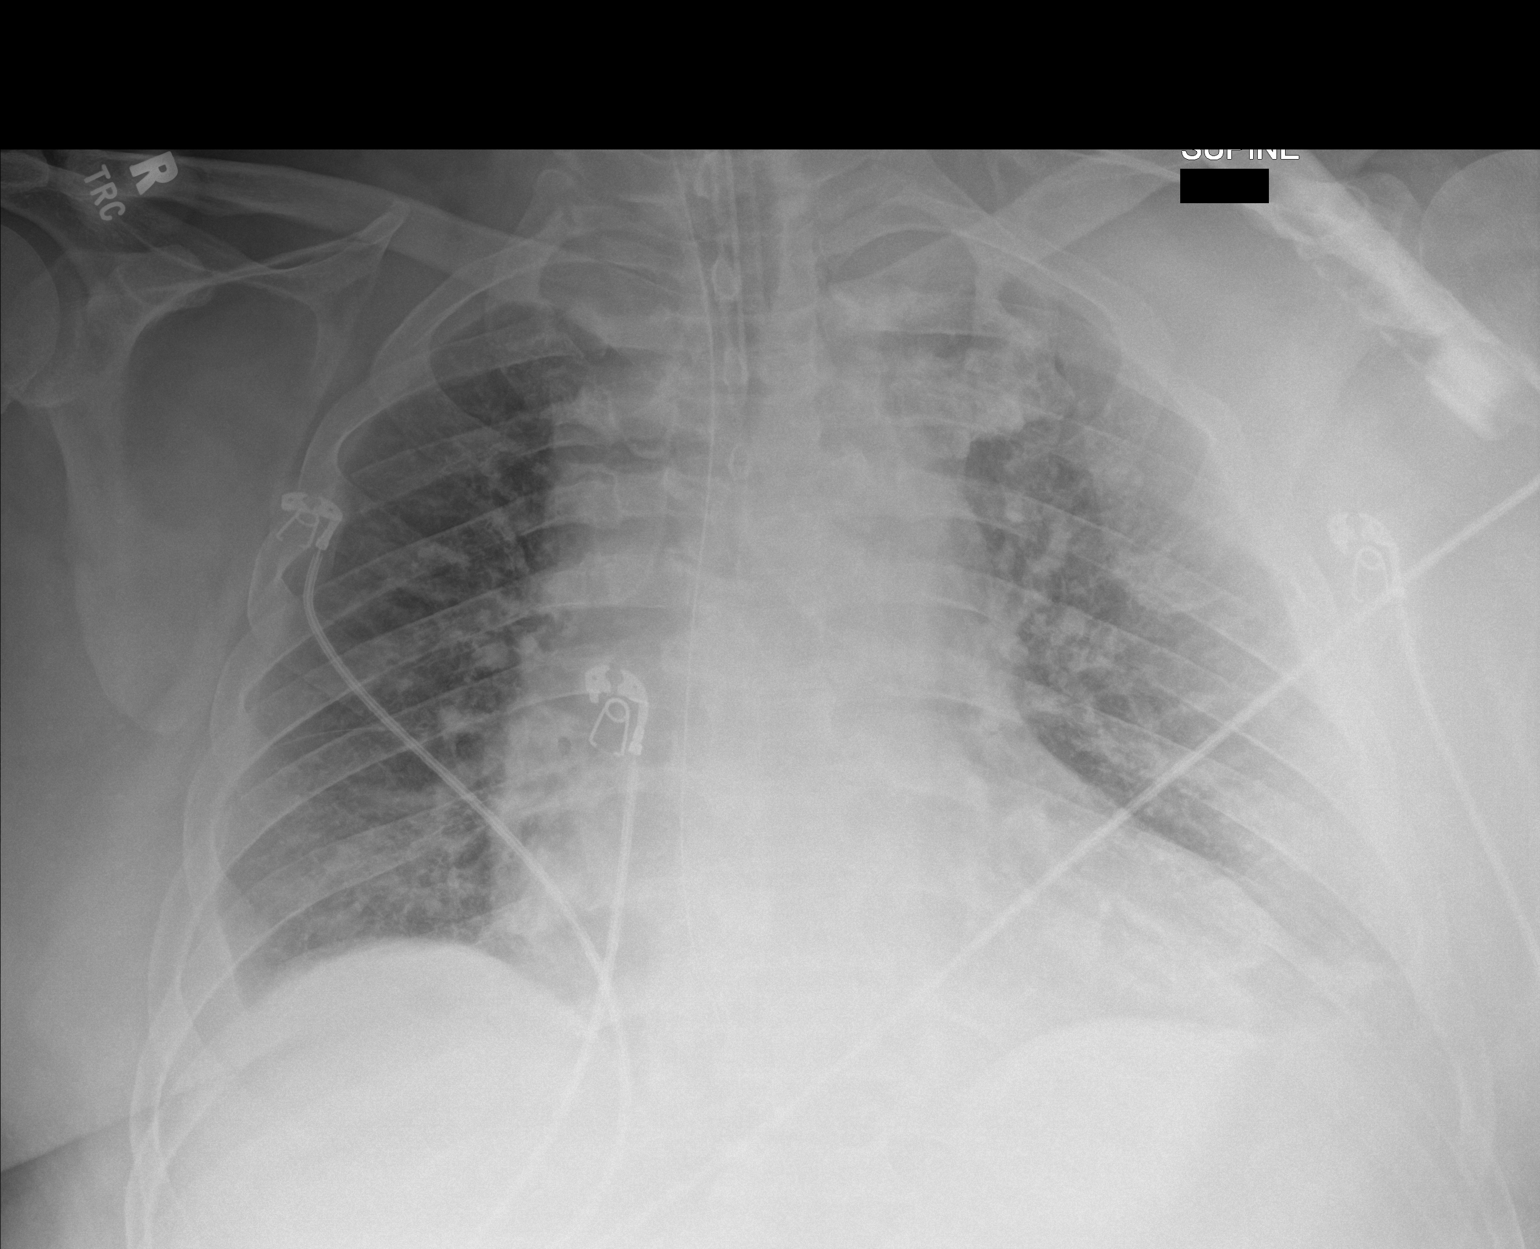

[1 of 1 positions shown; findings below may reference images not displayed]

FINDINGS: Endotracheal tube was placed with tip measuring 5.2 cm above the
carina. Enteric tube tip is not visualized off the field of view.
Shallow inspiration. Cardiac enlargement. No vascular congestion,
edema, or consolidation. Blunting of the costophrenic angles
suggests small effusions. No pneumothorax.
IMPRESSION: Appliances appear in satisfactory position. Cardiac enlargement.
Small effusions.

## 2022-07-07 IMAGING — CT CT HEAD W/O CM
3 series · 16 of 47 positions shown, 19 images · non-contrast
Comparison: None.

CLINICAL DATA: Encephalopathy

EXAM:
CT HEAD WITHOUT CONTRAST
TECHNIQUE: Contiguous axial images were obtained from the base of the skull
through the vertex without intravenous contrast.

[Series 2: head wo · axial · 0.39mm/px · z∈[-204,-54]mm · 10 of 36 slices shown, 13 images]
[im 3/36  brain]
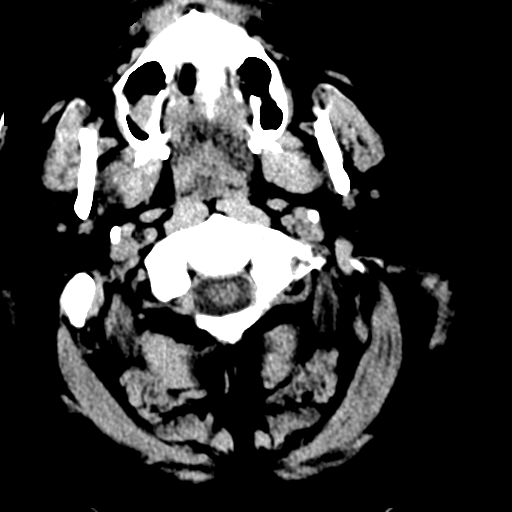
[im 3/36  bone]
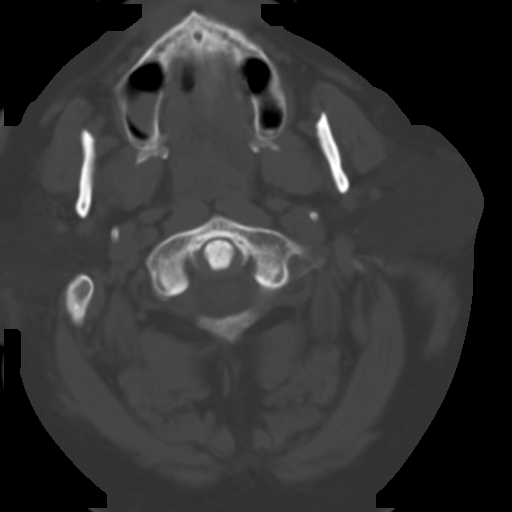
[im 7/36  brain]
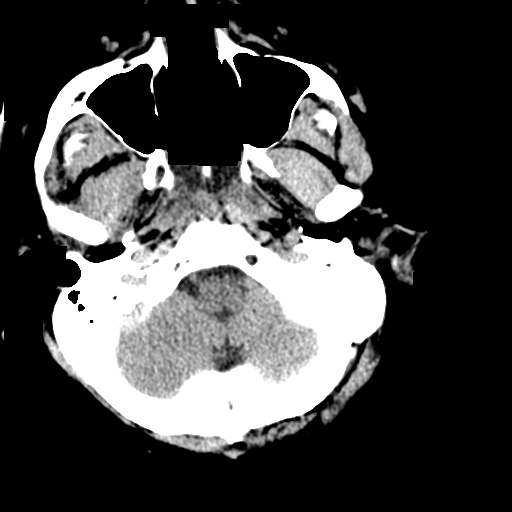
[im 10/36  brain]
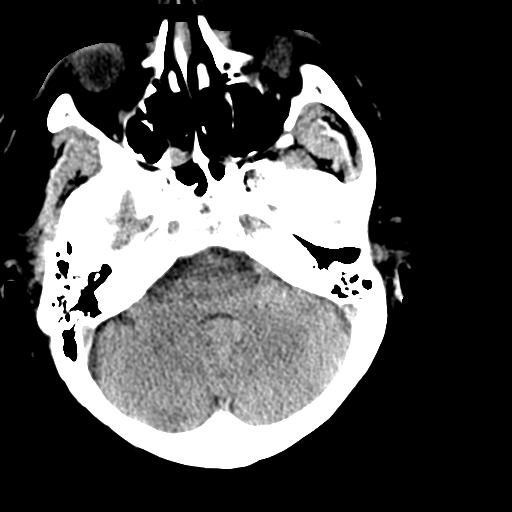
[im 13/36  brain]
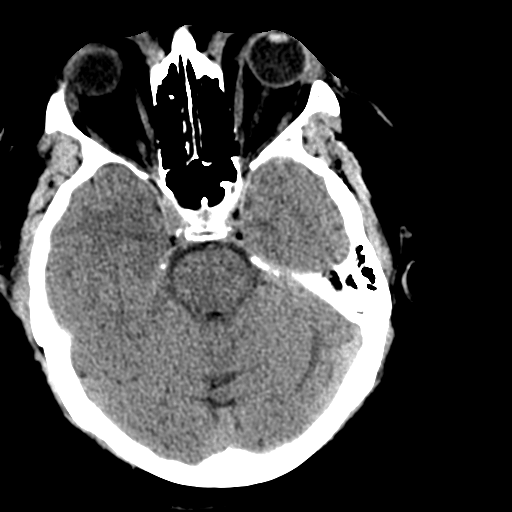
[im 16/36  brain]
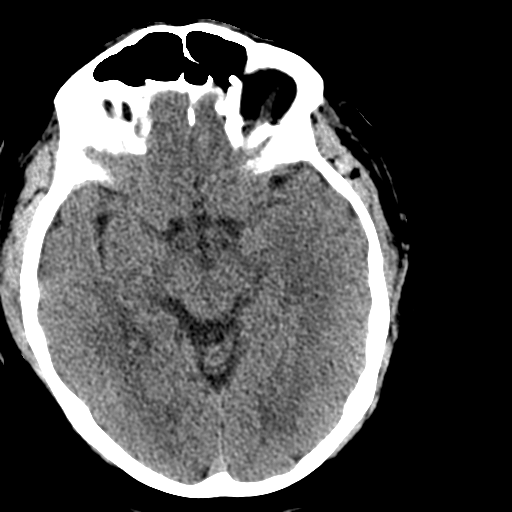
[im 16/36  bone]
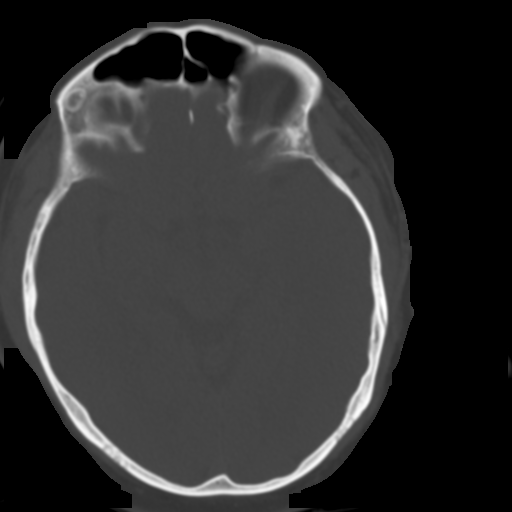
[im 20/36  brain]
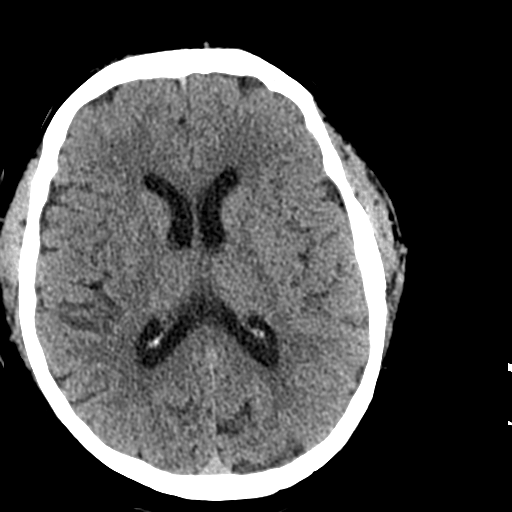
[im 23/36  brain]
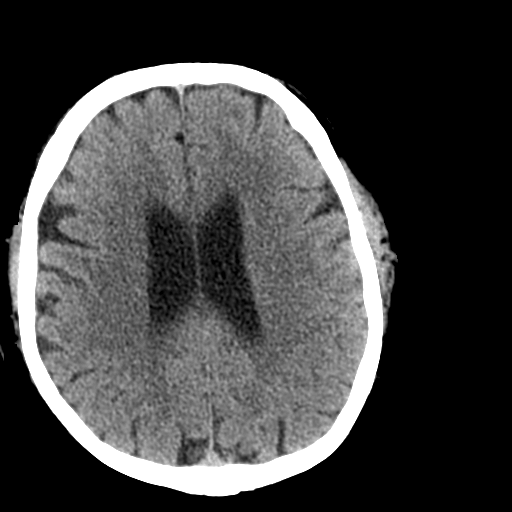
[im 27/36  brain]
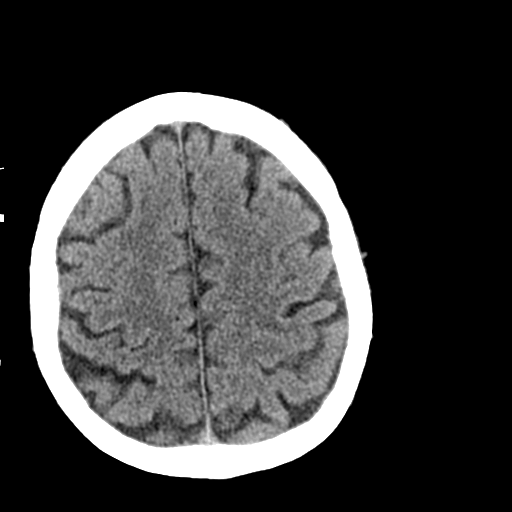
[im 29/36  brain]
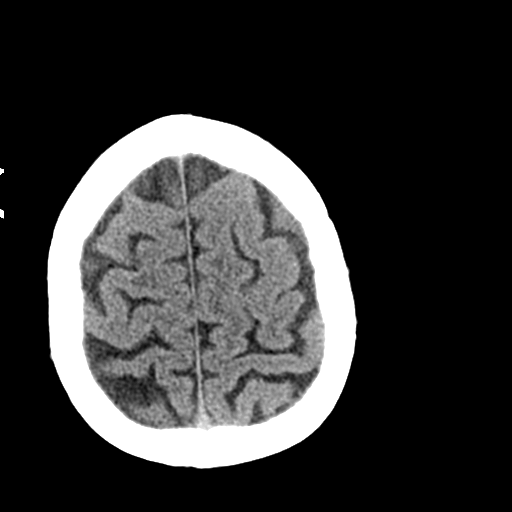
[im 29/36  bone]
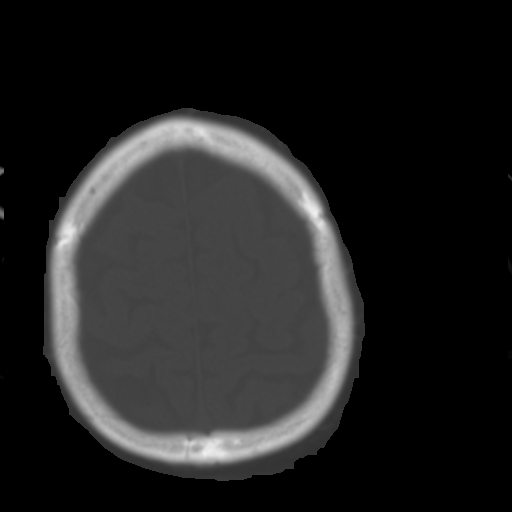
[im 33/36  brain]
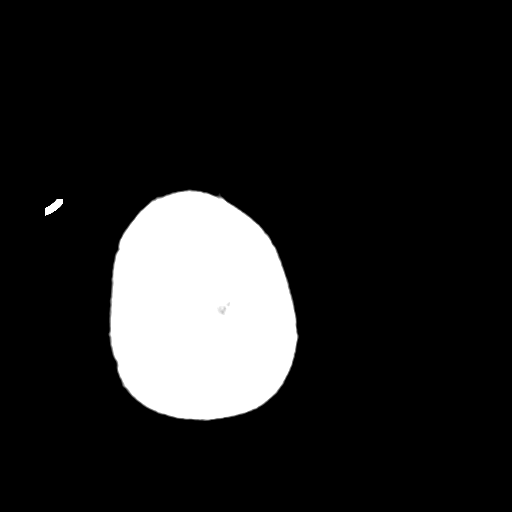

[Series 4: coronal soft tissue · coronal · 0.39mm/px · 3 of 83 slices shown]
[im 28/83  brain]
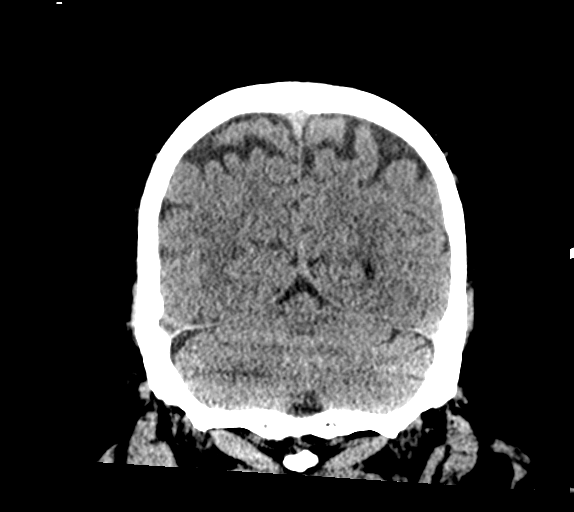
[im 37/83  brain]
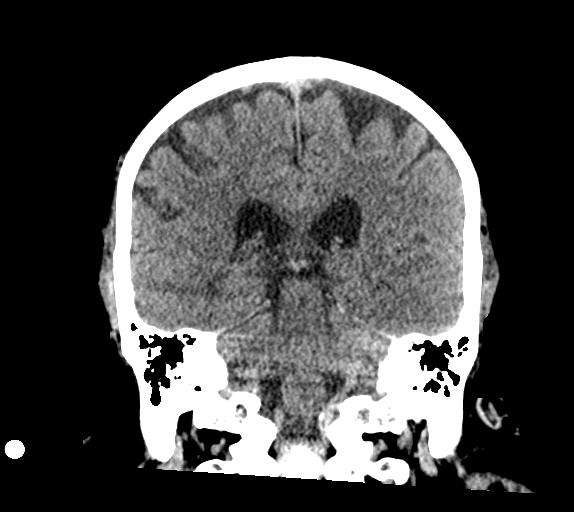
[im 46/83  brain]
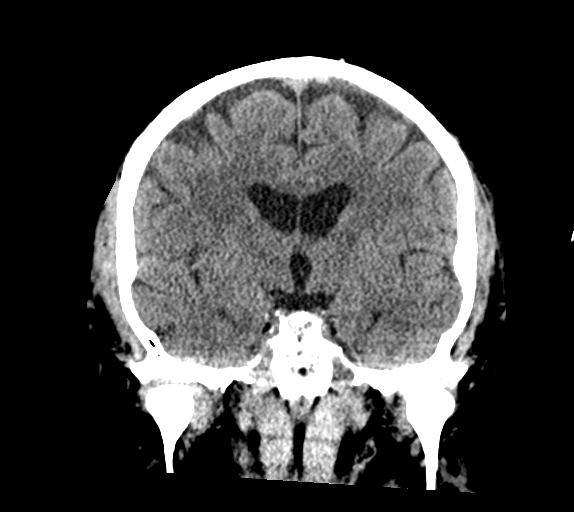

[Series 5: sagittal soft tissue · sagittal · 0.39mm/px · 3 of 73 slices shown]
[im 25/73  brain]
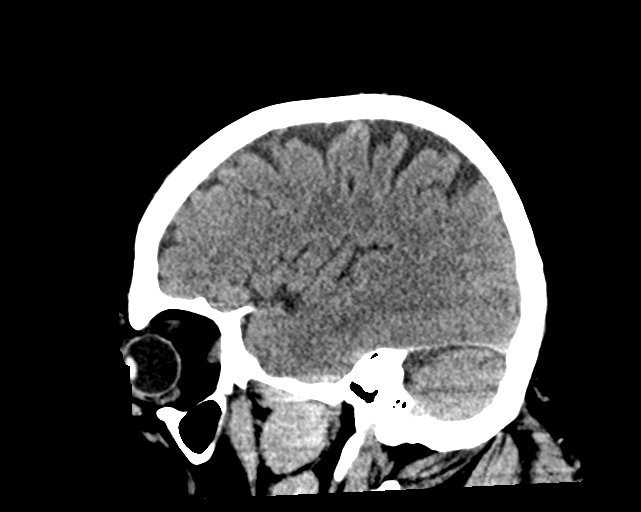
[im 37/73  brain]
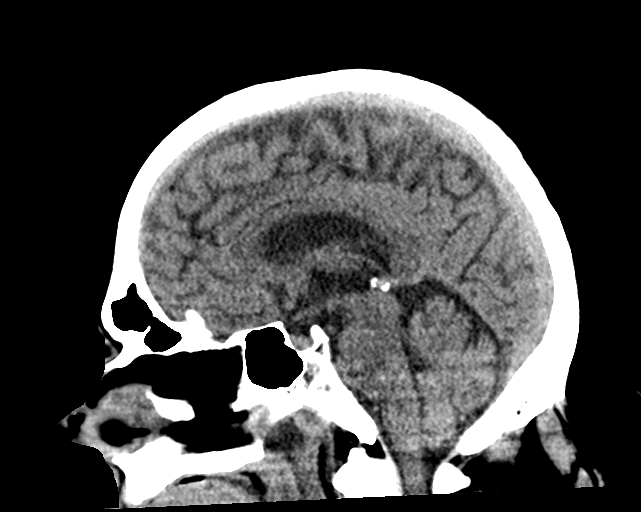
[im 49/73  brain]
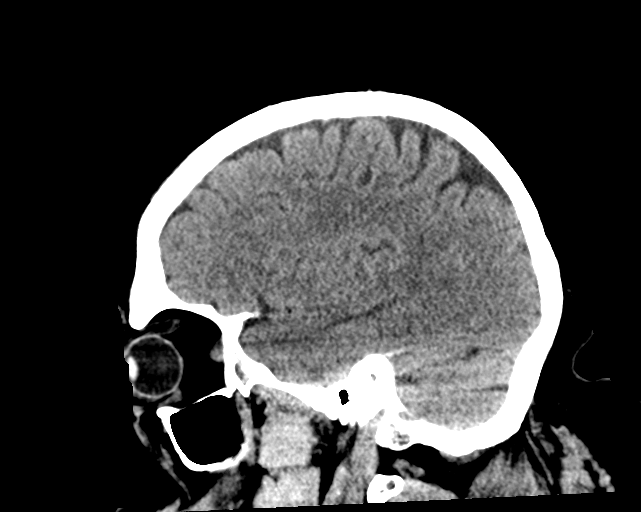

[16 of 47 positions shown; findings below may reference images not displayed]

FINDINGS: Brain: There is no mass, hemorrhage or extra-axial collection. The
size and configuration of the ventricles and extra-axial CSF spaces
are normal. The brain parenchyma is normal, without acute or chronic
infarction.

Vascular: No abnormal hyperdensity of the major intracranial
arteries or dural venous sinuses. No intracranial atherosclerosis.

Skull: The visualized skull base, calvarium and extracranial soft
tissues are normal.

Sinuses/Orbits: No fluid levels or advanced mucosal thickening of
the visualized paranasal sinuses. No mastoid or middle ear effusion.
The orbits are normal.
IMPRESSION: Normal head CT.

## 2022-07-07 IMAGING — DX DG ABDOMEN 1V
1 series · 1 of 1 positions shown · non-contrast
Comparison: None.

CLINICAL DATA: OG tube placement

EXAM:
ABDOMEN - 1 VIEW

[abdomen supine]
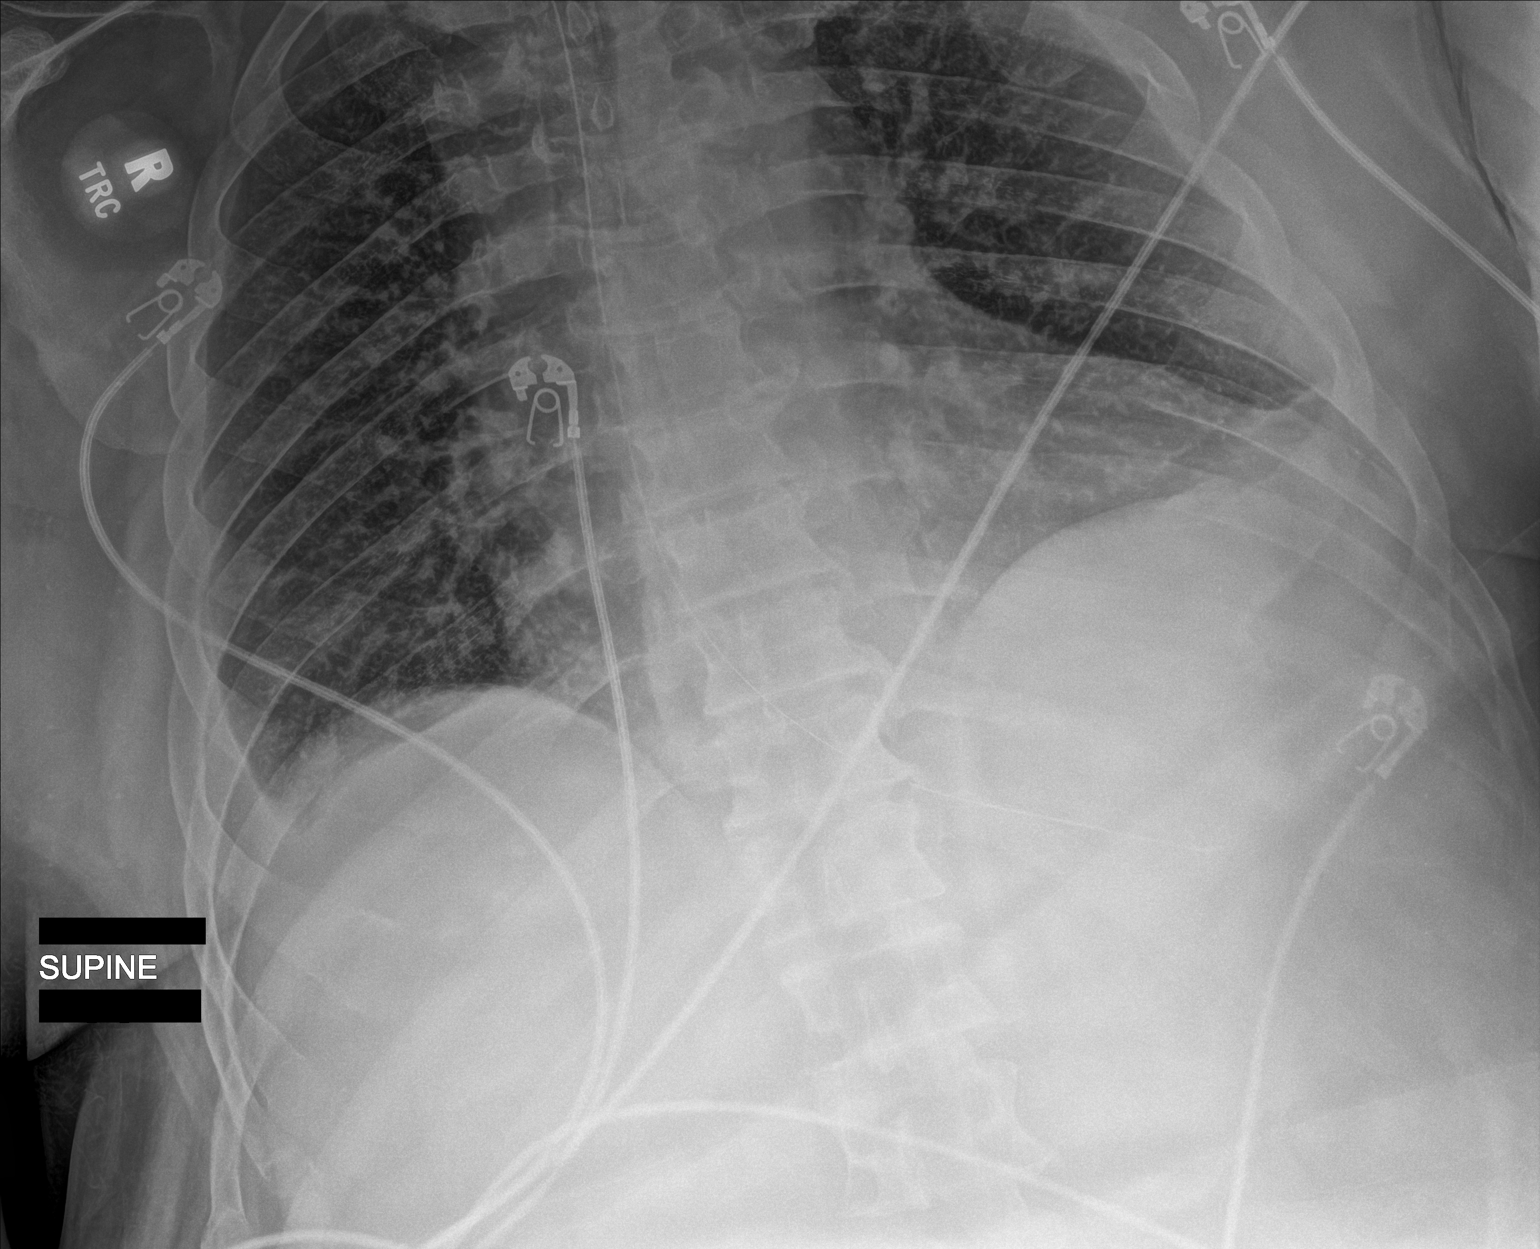

[1 of 1 positions shown; findings below may reference images not displayed]

FINDINGS: Enteric tube tip is in the left upper quadrant consistent with
location in the upper stomach.
IMPRESSION: Enteric tube tip is in the left upper quadrant consistent with
location in the upper stomach.

## 2022-09-21 ENCOUNTER — Encounter: Payer: Self-pay | Admitting: *Deleted

## 2022-10-20 ENCOUNTER — Encounter: Payer: Self-pay | Admitting: *Deleted

## 2022-12-01 ENCOUNTER — Encounter (INDEPENDENT_AMBULATORY_CARE_PROVIDER_SITE_OTHER): Payer: Medicare HMO | Admitting: Nurse Practitioner

## 2023-01-20 ENCOUNTER — Encounter (INDEPENDENT_AMBULATORY_CARE_PROVIDER_SITE_OTHER): Payer: Self-pay | Admitting: Nurse Practitioner

## 2023-03-16 NOTE — Progress Notes (Signed)
 error

## 2023-05-05 ENCOUNTER — Other Ambulatory Visit: Payer: Self-pay

## 2023-05-05 ENCOUNTER — Emergency Department
Admission: EM | Admit: 2023-05-05 | Discharge: 2023-05-05 | Disposition: A | Discharge status: Home / Self Care | Attending: Emergency Medicine | Admitting: Emergency Medicine

## 2023-05-05 ENCOUNTER — Emergency Department

## 2023-05-05 DIAGNOSIS — R0602 Shortness of breath: Secondary | ICD-10-CM | POA: Diagnosis present

## 2023-05-05 DIAGNOSIS — I10 Essential (primary) hypertension: Secondary | ICD-10-CM | POA: Diagnosis not present

## 2023-05-05 DIAGNOSIS — Z7984 Long term (current) use of oral hypoglycemic drugs: Secondary | ICD-10-CM | POA: Diagnosis not present

## 2023-05-05 DIAGNOSIS — J441 Chronic obstructive pulmonary disease with (acute) exacerbation: Secondary | ICD-10-CM | POA: Diagnosis not present

## 2023-05-05 DIAGNOSIS — Z5900 Homelessness unspecified: Secondary | ICD-10-CM | POA: Insufficient documentation

## 2023-05-05 DIAGNOSIS — R252 Cramp and spasm: Secondary | ICD-10-CM | POA: Insufficient documentation

## 2023-05-05 DIAGNOSIS — Z7982 Long term (current) use of aspirin: Secondary | ICD-10-CM | POA: Diagnosis not present

## 2023-05-05 DIAGNOSIS — E119 Type 2 diabetes mellitus without complications: Secondary | ICD-10-CM | POA: Diagnosis not present

## 2023-05-05 DIAGNOSIS — Z7901 Long term (current) use of anticoagulants: Secondary | ICD-10-CM | POA: Insufficient documentation

## 2023-05-05 DIAGNOSIS — I2510 Atherosclerotic heart disease of native coronary artery without angina pectoris: Secondary | ICD-10-CM | POA: Diagnosis not present

## 2023-05-05 LAB — BASIC METABOLIC PANEL WITH GFR
Anion gap: 10 (ref 5–15)
BUN: 19 mg/dL (ref 6–20)
CO2: 25 mmol/L (ref 22–32)
Calcium: 10 mg/dL (ref 8.9–10.3)
Chloride: 98 mmol/L (ref 98–111)
Creatinine, Ser: 1.15 mg/dL (ref 0.61–1.24)
GFR, Estimated: >60 mL/min (ref >60)
Glucose, Bld: 104 mg/dL — ABNORMAL HIGH (ref 70–99)
Potassium: 4.3 mmol/L (ref 3.5–5.1)
Sodium: 133 mmol/L — ABNORMAL LOW (ref 135–145)

## 2023-05-05 LAB — RESP PANEL BY RT-PCR (RSV, FLU A&B, COVID)  RVPGX2
Influenza A by PCR: NEGATIVE
Influenza B by PCR: NEGATIVE
Resp Syncytial Virus by PCR: NEGATIVE
SARS Coronavirus 2 by RT PCR: NEGATIVE

## 2023-05-05 LAB — CBC WITH DIFFERENTIAL/PLATELET
Abs Immature Granulocytes: 0.14 10*3/uL — ABNORMAL HIGH (ref 0.00–0.07)
Basophils Absolute: 0.1 10*3/uL (ref 0.0–0.1)
Basophils Relative: 0 %
Eosinophils Absolute: 0.3 10*3/uL (ref 0.0–0.5)
Eosinophils Relative: 3 %
HCT: 44.8 % (ref 39.0–52.0)
Hemoglobin: 14.5 g/dL (ref 13.0–17.0)
Immature Granulocytes: 1 %
Lymphocytes Relative: 7 %
Lymphs Abs: 0.9 10*3/uL (ref 0.7–4.0)
MCH: 27.7 pg (ref 26.0–34.0)
MCHC: 32.4 g/dL (ref 30.0–36.0)
MCV: 85.7 fL (ref 80.0–100.0)
Monocytes Absolute: 0.8 10*3/uL (ref 0.1–1.0)
Monocytes Relative: 7 %
Neutro Abs: 9.4 10*3/uL — ABNORMAL HIGH (ref 1.7–7.7)
Neutrophils Relative %: 82 %
Platelets: 254 10*3/uL (ref 150–400)
RBC: 5.23 MIL/uL (ref 4.22–5.81)
RDW: 16.1 % — ABNORMAL HIGH (ref 11.5–15.5)
WBC: 11.6 10*3/uL — ABNORMAL HIGH (ref 4.0–10.5)
nRBC: 0 % (ref 0.0–0.2)

## 2023-05-05 LAB — BRAIN NATRIURETIC PEPTIDE: B Natriuretic Peptide: 8.9 pg/mL (ref 0.0–100.0)

## 2023-05-05 LAB — PROCALCITONIN: Procalcitonin: <0.1 ng/mL

## 2023-05-05 LAB — TROPONIN I (HIGH SENSITIVITY)
Troponin I (High Sensitivity): 4 ng/L (ref <18)
Troponin I (High Sensitivity): 4 ng/L (ref <18)

## 2023-05-05 LAB — MAGNESIUM: Magnesium: 2 mg/dL (ref 1.7–2.4)

## 2023-05-05 LAB — D-DIMER, QUANTITATIVE: D-Dimer, Quant: 0.3 ug{FEU}/mL (ref 0.00–0.50)

## 2023-05-05 LAB — CBG MONITORING, ED: Glucose-Capillary: 145 mg/dL — ABNORMAL HIGH (ref 70–99)

## 2023-05-05 MED ORDER — IPRATROPIUM-ALBUTEROL 0.5-2.5 (3) MG/3ML IN SOLN
3.0000 mL | RESPIRATORY_TRACT | Status: AC
  Administered 2023-05-05 (×2): 3 mL via RESPIRATORY_TRACT
  Filled 2023-05-05 (×2): qty 3

## 2023-05-05 MED ORDER — PREDNISONE 20 MG PO TABS: 60.0000 mg | ORAL_TABLET | Freq: Every day | ORAL | 0 refills | Status: DC

## 2023-05-05 MED ORDER — APIXABAN 5 MG PO TABS: 5.0000 mg | ORAL_TABLET | Freq: Two times a day (BID) | ORAL | 1 refills | Status: AC

## 2023-05-05 MED ORDER — ATORVASTATIN CALCIUM 40 MG PO TABS: 40.0000 mg | ORAL_TABLET | Freq: Every day | ORAL | 1 refills | Status: AC

## 2023-05-05 MED ORDER — EZETIMIBE 10 MG PO TABS: 10.0000 mg | ORAL_TABLET | Freq: Every day | ORAL | 1 refills | Status: AC

## 2023-05-05 MED ORDER — METHYLPREDNISOLONE SODIUM SUCC 125 MG IJ SOLR
125.0000 mg | Freq: Once | INTRAMUSCULAR | Status: AC
  Administered 2023-05-05: 125 mg via INTRAVENOUS
  Filled 2023-05-05: qty 2

## 2023-05-05 MED ORDER — ALBUTEROL SULFATE HFA 108 (90 BASE) MCG/ACT IN AERS: 2.0000 | INHALATION_SPRAY | RESPIRATORY_TRACT | 0 refills | Status: DC | PRN

## 2023-05-05 MED ORDER — METFORMIN HCL 1000 MG PO TABS: 1000.0000 mg | ORAL_TABLET | Freq: Every day | ORAL | 1 refills | Status: AC

## 2023-05-05 MED ORDER — METHOCARBAMOL 1000 MG/10ML IJ SOLN
500.0000 mg | Freq: Once | INTRAMUSCULAR | Status: AC
  Administered 2023-05-05: 500 mg via INTRAVENOUS
  Filled 2023-05-05: qty 5

## 2023-05-05 NOTE — ED Provider Notes (Addendum)
 [facility] Provider Note    Event Date/Time   First MD Initiated Contact with Patient 05/05/23 0045     (approximate)   History   Shortness of Breath   HPI  [name]. is a 58 y.o. adult transgender male with history of COPD not on oxygen, hypertension, diabetes, hyperlipidemia, history of PE on Eliquis  who presents to the emergency department with shortness of breath.  Also having chest tightness.  EMS reports has had prior history of a heart attack 2 years ago.  No fevers, cough or congestion.  No lower extremity swelling or discomfort.  No hypoxia with EMS.  Does not wear oxygen chronically.  EMS gave breathing treatment, full dose aspirin .  Patient is on estrogen.   History provided by patient, EMS.    Past Medical History:  Diagnosis Date   Carpal tunnel syndrome    bilateral   COPD (chronic obstructive pulmonary disease) (HCC)    Coronary artery disease    Diabetes mellitus without complication (HCC)    Endotracheally intubated 09/30/2020   Hyperlipidemia    Hypertension    Neuropathy    On mechanically assisted ventilation (HCC) 09/30/2020   Peptic ulcer    Sleep apnea     Past Surgical History:  Procedure Laterality Date   COLONOSCOPY WITH PROPOFOL  N/A 11/09/2016   Procedure: COLONOSCOPY WITH PROPOFOL ;  Surgeon: Deveron Fly, MD;  Location: [facility] ENDOSCOPY;  Service: Endoscopy;  Laterality: N/A;   COLONOSCOPY WITH PROPOFOL  N/A 07/06/2017   Procedure: COLONOSCOPY WITH PROPOFOL ;  Surgeon: Deveron Fly, MD;  Location: [facility] ENDOSCOPY;  Service: Endoscopy;  Laterality: N/A;   CORONARY STENT INTERVENTION N/A 11/11/2021   Procedure: CORONARY STENT INTERVENTION;  Surgeon: Sammy Crisp, MD;  Location: [facility] INVASIVE CV LAB;  Service: Cardiovascular;  Laterality: N/A;   ESOPHAGOGASTRODUODENOSCOPY N/A 05/15/2021   Procedure: ESOPHAGOGASTRODUODENOSCOPY (EGD);  Surgeon: Quintin Buckle, DO;  Location: [facility] ENDOSCOPY;   Service: Gastroenterology;  Laterality: N/A;  DM   ESOPHAGOGASTRODUODENOSCOPY (EGD) WITH PROPOFOL  N/A 11/09/2016   Procedure: ESOPHAGOGASTRODUODENOSCOPY (EGD) WITH PROPOFOL ;  Surgeon: Deveron Fly, MD;  Location: [facility] ENDOSCOPY;  Service: Endoscopy;  Laterality: N/A;   ESOPHAGOGASTRODUODENOSCOPY (EGD) WITH PROPOFOL  N/A 07/06/2017   Procedure: ESOPHAGOGASTRODUODENOSCOPY (EGD) WITH PROPOFOL ;  Surgeon: Deveron Fly, MD;  Location: [facility] ENDOSCOPY;  Service: Endoscopy;  Laterality: N/A;   LEFT HEART CATH AND CORONARY ANGIOGRAPHY N/A 11/11/2021   Procedure: LEFT HEART CATH AND CORONARY ANGIOGRAPHY;  Surgeon: Sammy Crisp, MD;  Location: [facility] INVASIVE CV LAB;  Service: Cardiovascular;  Laterality: N/A;    MEDICATIONS:  Prior to Admission medications   Medication Sig Start Date End Date Taking? Authorizing Provider  albuterol  (VENTOLIN  HFA) 108 (90 Base) MCG/ACT inhaler Inhale 2 puffs into the lungs every 4 (four) hours as needed for wheezing or shortness of breath. 11/14/21 05/11/22  Tiajuana Fluke, MD  apixaban  (ELIQUIS ) 5 MG TABS tablet Take 2 tablets (10 mg total) by mouth 2 (two) times daily for 6 days. 05/13/22 05/19/22  Donaciano Frizzle, MD  apixaban  (ELIQUIS ) 5 MG TABS tablet Take 1 tablet (5 mg total) by mouth 2 (two) times daily. 05/19/22   Donaciano Frizzle, MD  ARIPiprazole  (ABILIFY ) 5 MG tablet Take 5 mg by mouth daily. 10/22/21   [provider]  aspirin  EC 81 MG tablet Take 1 tablet (81 mg total) by mouth daily. Swallow whole. 11/14/21   Tiajuana Fluke, MD  aspirin -acetaminophen -caffeine  (EXCEDRIN  MIGRAINE) 250-250-65 MG tablet Take by mouth every 6 (  six) hours as needed for headache.    [provider]  atorvastatin  (LIPITOR ) 40 MG tablet Take 40 mg by mouth daily. 04/16/22   [provider]  escitalopram  (LEXAPRO ) 20 MG tablet Take 20 mg by mouth daily. 10/06/19 05/11/22  [provider]  ezetimibe  (ZETIA ) 10 MG tablet Take 1 tablet (10 mg total) by  mouth daily. 11/14/21 12/14/21  Tiajuana Fluke, MD  famotidine  (PEPCID ) 40 MG tablet Take 40 mg by mouth daily. 06/20/21   [provider]  gabapentin  (NEURONTIN ) 600 MG tablet Take 600 mg by mouth 3 (three) times daily. 07/23/21   [provider]  hydrocortisone  2.5 % lotion APPLY TO AFFECTEDAREA RASH ON BUTTOCKS 3 DAYS PER WEEK. TUESDAY, THURSDAY, SATURDAY, AND AS NEEDED FOR FLARES 07/16/20   Elta Halter, MD  loratadine  (CLARITIN ) 10 MG tablet Take 10 mg by mouth daily.    [provider]  meloxicam  (MOBIC ) 15 MG tablet Take 1 tablet (15 mg total) by mouth daily. 11/15/19 11/09/21  Renaldo Caroli, MD  metFORMIN  (GLUCOPHAGE ) 1000 MG tablet Take 1,000 mg by mouth daily with breakfast.    [provider]  oxybutynin  (DITROPAN ) 5 MG tablet Take 5 mg by mouth every 12 (twelve) hours.    [provider]  pantoprazole  (PROTONIX ) 40 MG tablet Take 1 tablet (40 mg total) by mouth daily. Patient taking differently: Take 40 mg by mouth 2 (two) times daily. 01/19/21 05/11/22  Cuthriell, Ardath Bears, PA-C  simethicone  (MYLICON) 125 MG chewable tablet Chew 125 mg by mouth every 6 (six) hours as needed for flatulence.    [provider]  spironolactone  (ALDACTONE ) 50 MG tablet Take 1.5 tablets (75 mg total) by mouth 2 (two) times daily. Patient taking differently: Take 100 mg by mouth 2 (two) times daily. 11/14/21 05/11/22  Tiajuana Fluke, MD    Physical Exam   Triage Vital Signs: ED Triage Vitals  Encounter Vitals Group     BP 05/05/23 0051 123/80     Systolic BP Percentile --      Diastolic BP Percentile --      Pulse Rate 05/05/23 0051 97     Resp 05/05/23 0051 (!) 23     Temp 05/05/23 0051 99.7 F (37.6 C)     Temp Source 05/05/23 0051 Oral     SpO2 05/05/23 0050 98 %     Weight 05/05/23 0053 241 lb (109.3 kg)     Height 05/05/23 0053 5\' 8"  (1.727 m)     Head Circumference --      Peak Flow --      Pain Score 05/05/23 0052 2      Pain Loc --      Pain Education --      Exclude from Growth Chart --     Most recent vital signs: Vitals:   05/05/23 0700 05/05/23 0730  BP: 119/78 109/66  Pulse: (!) 109 (!) 102  Resp: 19 20  Temp:    SpO2: 93% 94%    CONSTITUTIONAL: Alert, responds appropriately to questions.  Obese, chronically ill-appearing HEAD: Normocephalic, atraumatic EYES: Conjunctivae clear, pupils appear equal, sclera nonicteric ENT: normal nose; moist mucous membranes NECK: Supple, normal ROM CARD: Regular and tachycardic; S1 and S2 appreciated RESP: Slightly tachypneic.  Speaking full sentences.  No significant distress.  Diffuse inspiratory and expiratory wheezes.  Diminished aeration at bases bilaterally.  No rhonchi or rales. ABD/GI: Non-distended; soft, non-tender, no rebound, no guarding, no peritoneal signs BACK: The  back appears normal EXT: Normal ROM in all joints; no deformity noted, no edema, no calf tenderness or calf swelling SKIN: Normal color for age and race; warm; no rash on exposed skin NEURO: Moves all extremities equally, normal speech PSYCH: The patient's mood and manner are appropriate.   ED Results / Procedures / Treatments   LABS: (all labs ordered are listed, but only abnormal results are displayed) Labs Reviewed  CBC WITH DIFFERENTIAL/PLATELET - Abnormal; Notable for the following components:      Result Value   WBC 11.6 (*)    RDW 16.1 (*)    Neutro Abs 9.4 (*)    Abs Immature Granulocytes 0.14 (*)    All other components within normal limits  BASIC METABOLIC PANEL WITH GFR - Abnormal; Notable for the following components:   Sodium 133 (*)    Glucose, Bld 104 (*)    All other components within normal limits  BLOOD GAS, VENOUS - Abnormal; Notable for the following components:   pCO2, Ven 42 (*)    pO2, Ven 46 (*)    Acid-base deficit 2.4 (*)    All other components within normal limits  CBG MONITORING, ED - Abnormal; Notable for the following components:    Glucose-Capillary 145 (*)    All other components within normal limits  RESP PANEL BY RT-PCR (RSV, FLU A&B, COVID)  RVPGX2  D-DIMER, QUANTITATIVE  BRAIN NATRIURETIC PEPTIDE  MAGNESIUM   PROCALCITONIN  TROPONIN I (HIGH SENSITIVITY)  TROPONIN I (HIGH SENSITIVITY)     EKG:  EKG Interpretation Date/Time:  Wednesday May 05 2023 00:50:55 EDT Ventricular Rate:  107 PR Interval:  155 QRS Duration:  95 QT Interval:  346 QTC Calculation: 462 R Axis:   92  Text Interpretation: Sinus tachycardia Borderline right axis deviation Confirmed by Verneda Golder [phone]) on 05/05/2023 1:06:27 AM         RADIOLOGY: My personal review and interpretation of imaging: Chest shows bronchitic changes.  No infiltrate or edema.  I have personally reviewed all radiology reports.   DG Chest Portable 1 View Result Date: 05/05/2023 CLINICAL DATA:  Shortness of breath and wheezing EXAM: PORTABLE CHEST 1 VIEW COMPARISON:  Radiograph and CT 05/09/2022 FINDINGS: Stable cardiomediastinal silhouette. Abnormal increased interstitial and bronchial markings in the lower lungs, though less conspicuous compared to 05/09/2022. No focal consolidation, pleural effusion, or pneumothorax. No displaced rib fractures. IMPRESSION: Findings suggest lower lobe bronchitis. Electronically Signed   By: Rozell Cornet M.D.   On: 05/05/2023 01:19     PROCEDURES:  Critical Care performed: No     .1-3 Lead EKG Interpretation  Performed by: Sean Macwilliams, Clover Dao, DO Authorized by: Aleighna Wojtas, Clover Dao, DO     Interpretation: abnormal     ECG rate:  103   ECG rate assessment: tachycardic     Rhythm: sinus tachycardia     Ectopy: none     Conduction: normal       IMPRESSION / MDM / ASSESSMENT AND PLAN / ED COURSE  I reviewed the triage vital signs and the nursing notes.    Patient here with shortness of breath, wheezing.  Does have history of PE and reports has not been compliant with Eliquis .  The patient is on the cardiac  monitor to evaluate for evidence of arrhythmia and/or significant heart rate changes.   DIFFERENTIAL DIAGNOSIS (includes but not limited to):   COPD exacerbation, pneumonia, viral URI, CHF, PE, ACS, pneumothorax, symptomatic anemia   Patient's presentation is most consistent with acute  presentation with potential threat to life or bodily function.   PLAN: Will obtain labs including troponin, BNP to rule out ACS, CHF, D-dimer to rule out PE.  Will give breathing treatments, Solu-Medrol .  EKG nonischemic.   MEDICATIONS GIVEN IN ED: Medications  ipratropium-albuterol  (DUONEB) 0.5-2.5 (3) MG/3ML nebulizer solution 3 mL (3 mLs Nebulization Given 05/05/23 0132)  methylPREDNISolone  sodium succinate (SOLU-MEDROL ) 125 mg/2 mL injection 125 mg (125 mg Intravenous Given 05/05/23 0117)  methocarbamol  (ROBAXIN ) injection 500 mg (500 mg Intravenous Given 05/05/23 0339)     ED COURSE: 3:10 AM  Labs show mild leukocytosis.  Normal electrolytes.  Troponin negative.  D-dimer negative.  BNP normal.  Chest x-ray reviewed and interpreted by myself and the radiologist and shows bronchitic changes but no edema or infiltrate.  COVID, flu and RSV negative.  Patient reports feeling much better from a breathing standpoint and lungs are now clear to auscultation but she is not having cramps in her legs likely from electrolyte shifts from albuterol .  Will give Robaxin , check magnesium  level.   4:30 AM  Pt seems more comfortable after Robaxin  but now almost overly somnolent.  Will check CBG, VBG.  Likely all from medication but will continue to monitor closely.  Magnesium  level normal.  Lungs still clear to auscultation.  6:40 AM  Pt's CBG, VBG normal.  Initial temperature was also 99.7 but normal on recheck with negative procalcitonin.  Suspect somnolence secondary to Robaxin  given here in the ED for leg cramps.  Patient has been resting comfortably, hemodynamically stable.  Anticipate discharge home in the morning once  more awake with taxi voucher versus social work evaluation secondary to homelessness.  6:55 AM  Pt still somnolent but arousable to voice.  Unable to discharge home safely at this time.  Will sign out the oncoming ED provider.  Anticipate potential discharge home with outpatient resources.  I have refilled her albuterol  and other home medications.  Will also discharge on prednisone  burst.  8:20 AM Patient now awake, alert with no complaints.  Have offered to allow her to stay to talk to social work, case management given patient is living in her car but she declined stating that she just wants to go back to her car.  Will offer her a taxi voucher to get back to her vehicle.  I have refilled her medications and sent an albuterol  inhaler and a prednisone  burst to her pharmacy.  CONSULTS: Admission considered for COPD exacerbation but patient improved and lungs now clear to auscultation.   OUTSIDE RECORDS REVIEWED: Reviewed last admission for COPD exacerbation in April 2024.       FINAL CLINICAL IMPRESSION(S) / ED DIAGNOSES   Final diagnoses:  COPD exacerbation (HCC)  Leg cramps  Homelessness     Rx / DC Orders   ED Discharge Orders          Ordered    albuterol  (VENTOLIN  HFA) 108 (90 Base) MCG/ACT inhaler  Every 4 hours PRN        05/05/23 0432    predniSONE  (DELTASONE ) 20 MG tablet  Daily        05/05/23 0432    apixaban  (ELIQUIS ) 5 MG TABS tablet  2 times daily        05/05/23 0432    metFORMIN  (GLUCOPHAGE ) 1000 MG tablet  Daily with breakfast        05/05/23 0432    ezetimibe  (ZETIA ) 10 MG tablet  Daily        05/05/23 0432  atorvastatin  (LIPITOR ) 40 MG tablet  Daily        05/05/23 0432             Note:  This document was prepared using Dragon voice recognition software and may include unintentional dictation errors.       Rhapsody Wolven, Clover Dao, DO 05/05/23 [phone]

## 2023-05-05 NOTE — ED Triage Notes (Signed)
 Pt arrives via EMS from home for c/o shortness of breath x 2 days with reproducible chest pain. Hx of COPD; asthma. Received Duoneb and 324 ASA en route with EMS. Reported to have used inhaler 7 times prior to EMS arrival.

## 2023-05-08 ENCOUNTER — Other Ambulatory Visit: Payer: Self-pay

## 2023-05-08 ENCOUNTER — Emergency Department

## 2023-05-08 ENCOUNTER — Inpatient Hospital Stay
Admission: EM | Admit: 2023-05-08 | Discharge: 2023-05-10 | DRG: 190 | Disposition: A | Discharge status: Home / Self Care | Attending: Internal Medicine | Admitting: Internal Medicine

## 2023-05-08 DIAGNOSIS — Z7982 Long term (current) use of aspirin: Secondary | ICD-10-CM

## 2023-05-08 DIAGNOSIS — Z5902 Unsheltered homelessness: Secondary | ICD-10-CM

## 2023-05-08 DIAGNOSIS — Z86711 Personal history of pulmonary embolism: Secondary | ICD-10-CM

## 2023-05-08 DIAGNOSIS — Z800 Family history of malignant neoplasm of digestive organs: Secondary | ICD-10-CM

## 2023-05-08 DIAGNOSIS — J441 Chronic obstructive pulmonary disease with (acute) exacerbation: Principal | ICD-10-CM | POA: Diagnosis present

## 2023-05-08 DIAGNOSIS — Z7984 Long term (current) use of oral hypoglycemic drugs: Secondary | ICD-10-CM

## 2023-05-08 DIAGNOSIS — Z7951 Long term (current) use of inhaled steroids: Secondary | ICD-10-CM

## 2023-05-08 DIAGNOSIS — G4733 Obstructive sleep apnea (adult) (pediatric): Secondary | ICD-10-CM | POA: Diagnosis present

## 2023-05-08 DIAGNOSIS — E669 Obesity, unspecified: Secondary | ICD-10-CM | POA: Diagnosis present

## 2023-05-08 DIAGNOSIS — I2510 Atherosclerotic heart disease of native coronary artery without angina pectoris: Secondary | ICD-10-CM

## 2023-05-08 DIAGNOSIS — Z8711 Personal history of peptic ulcer disease: Secondary | ICD-10-CM

## 2023-05-08 DIAGNOSIS — Z885 Allergy status to narcotic agent status: Secondary | ICD-10-CM

## 2023-05-08 DIAGNOSIS — R0602 Shortness of breath: Principal | ICD-10-CM

## 2023-05-08 DIAGNOSIS — F332 Major depressive disorder, recurrent severe without psychotic features: Secondary | ICD-10-CM | POA: Diagnosis present

## 2023-05-08 DIAGNOSIS — Z8249 Family history of ischemic heart disease and other diseases of the circulatory system: Secondary | ICD-10-CM

## 2023-05-08 DIAGNOSIS — Z955 Presence of coronary angioplasty implant and graft: Secondary | ICD-10-CM

## 2023-05-08 DIAGNOSIS — Z79899 Other long term (current) drug therapy: Secondary | ICD-10-CM

## 2023-05-08 DIAGNOSIS — Z5901 Sheltered homelessness: Secondary | ICD-10-CM

## 2023-05-08 DIAGNOSIS — Z9861 Coronary angioplasty status: Secondary | ICD-10-CM

## 2023-05-08 DIAGNOSIS — Z91013 Allergy to seafood: Secondary | ICD-10-CM

## 2023-05-08 DIAGNOSIS — I10 Essential (primary) hypertension: Secondary | ICD-10-CM | POA: Diagnosis present

## 2023-05-08 DIAGNOSIS — J9601 Acute respiratory failure with hypoxia: Secondary | ICD-10-CM | POA: Diagnosis present

## 2023-05-08 DIAGNOSIS — Z1152 Encounter for screening for COVID-19: Secondary | ICD-10-CM

## 2023-05-08 DIAGNOSIS — Z87891 Personal history of nicotine dependence: Secondary | ICD-10-CM

## 2023-05-08 DIAGNOSIS — B9789 Other viral agents as the cause of diseases classified elsewhere: Secondary | ICD-10-CM | POA: Diagnosis present

## 2023-05-08 DIAGNOSIS — E1151 Type 2 diabetes mellitus with diabetic peripheral angiopathy without gangrene: Secondary | ICD-10-CM | POA: Diagnosis not present

## 2023-05-08 DIAGNOSIS — Z833 Family history of diabetes mellitus: Secondary | ICD-10-CM

## 2023-05-08 DIAGNOSIS — E119 Type 2 diabetes mellitus without complications: Secondary | ICD-10-CM

## 2023-05-08 DIAGNOSIS — Z7901 Long term (current) use of anticoagulants: Secondary | ICD-10-CM

## 2023-05-08 DIAGNOSIS — I2699 Other pulmonary embolism without acute cor pulmonale: Secondary | ICD-10-CM | POA: Diagnosis present

## 2023-05-08 DIAGNOSIS — Z888 Allergy status to other drugs, medicaments and biological substances status: Secondary | ICD-10-CM

## 2023-05-08 DIAGNOSIS — Z811 Family history of alcohol abuse and dependence: Secondary | ICD-10-CM

## 2023-05-08 DIAGNOSIS — E785 Hyperlipidemia, unspecified: Secondary | ICD-10-CM | POA: Diagnosis present

## 2023-05-08 DIAGNOSIS — Z6836 Body mass index (BMI) 36.0-36.9, adult: Secondary | ICD-10-CM

## 2023-05-08 LAB — TROPONIN I (HIGH SENSITIVITY)
Troponin I (High Sensitivity): 5 ng/L (ref <18)
Troponin I (High Sensitivity): 5 ng/L (ref <18)

## 2023-05-08 LAB — CBC
HCT: 45.2 % (ref 39.0–52.0)
Hemoglobin: 15.2 g/dL (ref 13.0–17.0)
MCH: 28.1 pg (ref 26.0–34.0)
MCHC: 33.6 g/dL (ref 30.0–36.0)
MCV: 83.7 fL (ref 80.0–100.0)
Platelets: 238 10*3/uL (ref 150–400)
RBC: 5.4 MIL/uL (ref 4.22–5.81)
RDW: 15.7 % — ABNORMAL HIGH (ref 11.5–15.5)
WBC: 9.7 10*3/uL (ref 4.0–10.5)
nRBC: 0 % (ref 0.0–0.2)

## 2023-05-08 LAB — COMPREHENSIVE METABOLIC PANEL WITH GFR
ALT: 16 U/L (ref 0–44)
AST: 22 U/L (ref 15–41)
Albumin: 3.7 g/dL (ref 3.5–5.0)
Alkaline Phosphatase: 76 U/L (ref 38–126)
Anion gap: 11 (ref 5–15)
BUN: 19 mg/dL (ref 6–20)
CO2: 22 mmol/L (ref 22–32)
Calcium: 9.2 mg/dL (ref 8.9–10.3)
Chloride: 97 mmol/L — ABNORMAL LOW (ref 98–111)
Creatinine, Ser: 1.28 mg/dL — ABNORMAL HIGH (ref 0.61–1.24)
GFR, Estimated: >60 mL/min (ref >60)
Glucose, Bld: 110 mg/dL — ABNORMAL HIGH (ref 70–99)
Potassium: 4 mmol/L (ref 3.5–5.1)
Sodium: 130 mmol/L — ABNORMAL LOW (ref 135–145)
Total Bilirubin: 0.9 mg/dL (ref 0.0–1.2)
Total Protein: 8.1 g/dL (ref 6.5–8.1)

## 2023-05-08 LAB — RESP PANEL BY RT-PCR (RSV, FLU A&B, COVID)  RVPGX2
Influenza A by PCR: NEGATIVE
Influenza B by PCR: NEGATIVE
Resp Syncytial Virus by PCR: NEGATIVE
SARS Coronavirus 2 by RT PCR: NEGATIVE

## 2023-05-08 LAB — BRAIN NATRIURETIC PEPTIDE: B Natriuretic Peptide: 32.6 pg/mL (ref 0.0–100.0)

## 2023-05-08 MED ORDER — IPRATROPIUM-ALBUTEROL 0.5-2.5 (3) MG/3ML IN SOLN
3.0000 mL | Freq: Four times a day (QID) | RESPIRATORY_TRACT | Status: DC
  Administered 2023-05-08 – 2023-05-10 (×8): 3 mL via RESPIRATORY_TRACT
  Filled 2023-05-08 (×8): qty 3

## 2023-05-08 MED ORDER — SODIUM CHLORIDE 0.9 % IV SOLN
1.0000 g | INTRAVENOUS | Status: DC
  Administered 2023-05-08: 1 g via INTRAVENOUS
  Filled 2023-05-08: qty 10

## 2023-05-08 MED ORDER — BUDESONIDE 0.25 MG/2ML IN SUSP
0.2500 mg | Freq: Two times a day (BID) | RESPIRATORY_TRACT | Status: DC
  Administered 2023-05-08 – 2023-05-10 (×4): 0.25 mg via RESPIRATORY_TRACT
  Filled 2023-05-08 (×4): qty 2

## 2023-05-08 MED ORDER — EZETIMIBE 10 MG PO TABS
10.0000 mg | ORAL_TABLET | Freq: Every day | ORAL | Status: DC
  Administered 2023-05-09 – 2023-05-10 (×2): 10 mg via ORAL
  Filled 2023-05-08 (×2): qty 1

## 2023-05-08 MED ORDER — ONDANSETRON HCL 4 MG/2ML IJ SOLN: 4.0000 mg | Freq: Four times a day (QID) | INTRAMUSCULAR | Status: DC | PRN

## 2023-05-08 MED ORDER — ASPIRIN 81 MG PO TBEC
81.0000 mg | DELAYED_RELEASE_TABLET | Freq: Every day | ORAL | Status: DC
  Administered 2023-05-09 – 2023-05-10 (×2): 81 mg via ORAL
  Filled 2023-05-08 (×2): qty 1

## 2023-05-08 MED ORDER — IPRATROPIUM-ALBUTEROL 0.5-2.5 (3) MG/3ML IN SOLN
3.0000 mL | Freq: Once | RESPIRATORY_TRACT | Status: AC
  Administered 2023-05-08: 3 mL via RESPIRATORY_TRACT
  Filled 2023-05-08: qty 3

## 2023-05-08 MED ORDER — GABAPENTIN 300 MG PO CAPS
300.0000 mg | ORAL_CAPSULE | Freq: Three times a day (TID) | ORAL | Status: DC
Start: 2023-05-08 — End: 2023-05-10
  Administered 2023-05-08 – 2023-05-10 (×5): 300 mg via ORAL
  Filled 2023-05-08 (×5): qty 1

## 2023-05-08 MED ORDER — SODIUM CHLORIDE 0.9 % IV SOLN
500.0000 mg | INTRAVENOUS | Status: DC
  Administered 2023-05-08: 500 mg via INTRAVENOUS
  Filled 2023-05-08: qty 5

## 2023-05-08 MED ORDER — IPRATROPIUM-ALBUTEROL 0.5-2.5 (3) MG/3ML IN SOLN
6.0000 mL | Freq: Once | RESPIRATORY_TRACT | Status: AC
  Administered 2023-05-08: 6 mL via RESPIRATORY_TRACT
  Filled 2023-05-08: qty 6

## 2023-05-08 MED ORDER — APIXABAN 5 MG PO TABS
5.0000 mg | ORAL_TABLET | Freq: Two times a day (BID) | ORAL | Status: DC
  Administered 2023-05-08 – 2023-05-10 (×4): 5 mg via ORAL
  Filled 2023-05-08 (×4): qty 1

## 2023-05-08 MED ORDER — LACTATED RINGERS IV BOLUS
500.0000 mL | Freq: Once | INTRAVENOUS | Status: AC
  Administered 2023-05-08: 500 mL via INTRAVENOUS

## 2023-05-08 MED ORDER — PREDNISONE 20 MG PO TABS
40.0000 mg | ORAL_TABLET | Freq: Every day | ORAL | Status: DC
  Administered 2023-05-09 – 2023-05-10 (×2): 40 mg via ORAL
  Filled 2023-05-08 (×2): qty 2

## 2023-05-08 MED ORDER — ARIPIPRAZOLE 5 MG PO TABS
5.0000 mg | ORAL_TABLET | Freq: Every day | ORAL | Status: DC
Start: 2023-05-08 — End: 2023-05-10
  Administered 2023-05-08 – 2023-05-10 (×3): 5 mg via ORAL
  Filled 2023-05-08 (×3): qty 1

## 2023-05-08 MED ORDER — ATORVASTATIN CALCIUM 20 MG PO TABS
40.0000 mg | ORAL_TABLET | Freq: Every day | ORAL | Status: DC
  Administered 2023-05-09 – 2023-05-10 (×2): 40 mg via ORAL
  Filled 2023-05-08 (×2): qty 2

## 2023-05-08 MED ORDER — POLYETHYLENE GLYCOL 3350 17 G PO PACK: 17.0000 g | PACK | Freq: Every day | ORAL | Status: DC | PRN

## 2023-05-08 MED ORDER — ACETAMINOPHEN 325 MG PO TABS
650.0000 mg | ORAL_TABLET | Freq: Four times a day (QID) | ORAL | Status: DC | PRN
  Administered 2023-05-08: 650 mg via ORAL
  Filled 2023-05-08: qty 2

## 2023-05-08 MED ORDER — ESTRADIOL 0.05 MG/24HR TD PTWK
0.1000 mg | MEDICATED_PATCH | TRANSDERMAL | Status: DC
  Administered 2023-05-09: 0.1 mg via TRANSDERMAL
  Filled 2023-05-08: qty 2

## 2023-05-08 MED ORDER — PNEUMOCOCCAL 20-VAL CONJ VACC 0.5 ML IM SUSY: 0.5000 mL | PREFILLED_SYRINGE | INTRAMUSCULAR | Status: DC

## 2023-05-08 MED ORDER — ENSURE ENLIVE PO LIQD
237.0000 mL | Freq: Two times a day (BID) | ORAL | Status: DC
Start: 2023-05-09 — End: 2023-05-10

## 2023-05-08 MED ORDER — ESCITALOPRAM OXALATE 10 MG PO TABS
20.0000 mg | ORAL_TABLET | Freq: Every day | ORAL | Status: DC
  Administered 2023-05-09 – 2023-05-10 (×2): 20 mg via ORAL
  Filled 2023-05-08 (×2): qty 2

## 2023-05-08 NOTE — Assessment & Plan Note (Signed)
 Patient was admitted in April 2024 with acute PE, on Eliquis .  Recent D-dimer negative.  - Continue home Eliquis 

## 2023-05-08 NOTE — Assessment & Plan Note (Signed)
 Given blood pressures on the lower end of normal, will hold home antihypertensives for today and resume when stable.  - Hold home antihypertensives

## 2023-05-08 NOTE — H&P (Signed)
 History and Physical    Patient: [name]. BJY:[id number] DOB: Dec 30, 1965 DOA: 05/08/2023 DOS: the patient was seen and examined on 05/08/2023 PCP: Evette Hoes, NP  Patient coming from: Homeless  Chief Complaint:  Chief Complaint  Patient presents with   Shortness of Breath   HPI: [name]., preferred name [name], is a 58 y.o. adult with medical history significant of COPD, type 2 diabetes, recent PE on Eliquis , hypertension, hyperlipidemia, OSA, PUD, CAD s/p PCI with DES to RCA (October 2023), who presents to the ED due to shortness of breath.  [name] states that for the last several days, she has been experiencing gradually worsening shortness of breath and productive cough.  She states that she has a cough at baseline, however it has increased in intensity and in frequency.  She has been experiencing chest pain only while coughing, no chest pain at rest.  She denies any nausea, vomiting, diarrhea, abdominal pain, lower extremity swelling.  She has tried to use her home inhalers without relief.  She was seen in the ED for the same symptoms a few days ago and started on prednisone , however was unable to pick it up from the pharmacy.  She has been taking Eliquis  without any missed doses  ED course: On arrival to the ED, patient was normotensive at 108/76 with heart rate of 100.  She was saturating at 94% on 2 L.  She was afebrile at 98.5.  Initial workup notable for unremarkable CBC, sodium 130, creatinine 1.28 with GFR above 60.  BNP and troponin negative.  Chest x-ray with no acute opacities.  Patient started on DuoNebs, TRH contacted for admission.  Review of Systems: As mentioned in the history of present illness. All other systems reviewed and are negative.  Past Medical History:  Diagnosis Date   Carpal tunnel syndrome    bilateral   COPD (chronic obstructive pulmonary disease) (HCC)    Coronary artery disease    Diabetes mellitus without complication (HCC)     Endotracheally intubated 09/30/2020   Hyperlipidemia    Hypertension    Neuropathy    On mechanically assisted ventilation (HCC) 09/30/2020   Peptic ulcer    Sleep apnea    Past Surgical History:  Procedure Laterality Date   COLONOSCOPY WITH PROPOFOL  N/A 11/09/2016   Procedure: COLONOSCOPY WITH PROPOFOL ;  Surgeon: Deveron Fly, MD;  Location: [facility] ENDOSCOPY;  Service: Endoscopy;  Laterality: N/A;   COLONOSCOPY WITH PROPOFOL  N/A 07/06/2017   Procedure: COLONOSCOPY WITH PROPOFOL ;  Surgeon: Deveron Fly, MD;  Location: [facility] ENDOSCOPY;  Service: Endoscopy;  Laterality: N/A;   CORONARY STENT INTERVENTION N/A 11/11/2021   Procedure: CORONARY STENT INTERVENTION;  Surgeon: Sammy Crisp, MD;  Location: [facility] INVASIVE CV LAB;  Service: Cardiovascular;  Laterality: N/A;   ESOPHAGOGASTRODUODENOSCOPY N/A 05/15/2021   Procedure: ESOPHAGOGASTRODUODENOSCOPY (EGD);  Surgeon: Quintin Buckle, DO;  Location: [facility] ENDOSCOPY;  Service: Gastroenterology;  Laterality: N/A;  DM   ESOPHAGOGASTRODUODENOSCOPY (EGD) WITH PROPOFOL  N/A 11/09/2016   Procedure: ESOPHAGOGASTRODUODENOSCOPY (EGD) WITH PROPOFOL ;  Surgeon: Deveron Fly, MD;  Location: [facility] ENDOSCOPY;  Service: Endoscopy;  Laterality: N/A;   ESOPHAGOGASTRODUODENOSCOPY (EGD) WITH PROPOFOL  N/A 07/06/2017   Procedure: ESOPHAGOGASTRODUODENOSCOPY (EGD) WITH PROPOFOL ;  Surgeon: Deveron Fly, MD;  Location: [facility] ENDOSCOPY;  Service: Endoscopy;  Laterality: N/A;   LEFT HEART CATH AND CORONARY ANGIOGRAPHY N/A 11/11/2021   Procedure: LEFT HEART CATH AND CORONARY ANGIOGRAPHY;  Surgeon: Sammy Crisp, MD;  Location: [facility] INVASIVE CV LAB;  Service: Cardiovascular;  Laterality: N/A;   Social History:  reports that she quit smoking about 3 years ago. Her smoking use included cigarettes. She started smoking about 33 years ago. She has a 22.5 pack-year smoking history. She has never used smokeless tobacco. She reports that she does not drink  alcohol and does not use drugs.  Allergies  Allergen Reactions   Shellfish Allergy Swelling   Codeine Rash   Lisinopril Cough    Family History  Problem Relation Age of Onset   Diabetes Mother    Diabetes Father    Stomach cancer Father    Heart attack Father    Alcohol abuse Father    Heart attack Sister    Heart attack Brother    Heart attack Paternal Aunt    Heart attack Paternal Uncle     Prior to Admission medications   Medication Sig Start Date End Date Taking? Authorizing Provider  albuterol  (VENTOLIN  HFA) 108 (90 Base) MCG/ACT inhaler Inhale 2 puffs into the lungs every 4 (four) hours as needed for wheezing or shortness of breath. 11/14/21 05/08/23 Yes Sreenath, Sudheer B, MD  apixaban  (ELIQUIS ) 5 MG TABS tablet Take 1 tablet (5 mg total) by mouth 2 (two) times daily. 05/05/23  Yes Ward, Clover Dao, DO  ARIPiprazole  (ABILIFY ) 5 MG tablet Take 5 mg by mouth daily. 10/22/21  Yes [provider]  aspirin -acetaminophen -caffeine  (EXCEDRIN  MIGRAINE) 250-250-65 MG tablet Take 1 tablet by mouth every 6 (six) hours as needed for headache.   Yes [provider]  atorvastatin  (LIPITOR ) 40 MG tablet Take 1 tablet (40 mg total) by mouth daily. 05/05/23  Yes Ward, Clover Dao, DO  BREZTRI AEROSPHERE 160-9-4.8 MCG/ACT AERO inhaler Inhale 2 puffs into the lungs 2 (two) times daily. 04/19/23  Yes [provider]  escitalopram  (LEXAPRO ) 20 MG tablet Take 20 mg by mouth daily. 10/06/19 05/08/23 Yes [provider]  estradiol  (CLIMARA  - DOSED IN MG/24 HR) 0.1 mg/24hr patch Place 0.1 mg onto the skin once a week. 04/23/23  Yes [provider]  famotidine  (PEPCID ) 40 MG tablet Take 40 mg by mouth daily. 06/20/21  Yes [provider]  gabapentin  (NEURONTIN ) 300 MG capsule Take 300 mg by mouth 3 (three) times daily. 07/23/21  Yes [provider]  ipratropium-albuterol  (DUONEB) 0.5-2.5 (3) MG/3ML SOLN Take 3 mLs by nebulization every 4 (four) hours as  needed. 04/19/23  Yes [provider]  simethicone  (MYLICON) 125 MG chewable tablet Chew 125 mg by mouth every 6 (six) hours as needed for flatulence.   Yes [provider]  spironolactone  (ALDACTONE ) 100 MG tablet Take 100 mg by mouth 2 (two) times daily. 200 mg in the morning 100 mg in the evening 04/23/23  Yes [provider]  aspirin  EC 81 MG tablet Take 1 tablet (81 mg total) by mouth daily. Swallow whole. 11/14/21   Tiajuana Fluke, MD  ezetimibe  (ZETIA ) 10 MG tablet Take 1 tablet (10 mg total) by mouth daily. 05/05/23 06/04/23  Ward, Clover Dao, DO  loratadine  (CLARITIN ) 10 MG tablet Take 10 mg by mouth daily.    [provider]  metFORMIN  (GLUCOPHAGE ) 1000 MG tablet Take 1 tablet (1,000 mg total) by mouth daily with breakfast. 05/05/23   Ward, Clover Dao, DO  oxybutynin  (DITROPAN ) 5 MG tablet Take 5 mg by mouth every 12 (twelve) hours.    [provider]  pantoprazole  (PROTONIX ) 40 MG tablet Take 1 tablet (40 mg total) by mouth daily. Patient taking differently: Take 40 mg  by mouth 2 (two) times daily. 01/19/21 05/11/22  Cuthriell, Ardath Bears, PA-C  spironolactone  (ALDACTONE ) 50 MG tablet Take 1.5 tablets (75 mg total) by mouth 2 (two) times daily. Patient taking differently: Take 100 mg by mouth 2 (two) times daily. 11/14/21 05/11/22  Tiajuana Fluke, MD    Physical Exam: Vitals:   05/08/23 1830 05/08/23 1834 05/08/23 1839 05/08/23 1840  BP: (!) 92/51 (!) 87/55 100/65   Pulse: 95     Resp: (!) 22 (!) 22 (!) 23   Temp:    97.8 F (36.6 C)  TempSrc:    Oral  SpO2: 93%     Weight:      Height:       Physical Exam Vitals and nursing note reviewed.  Constitutional:      General: She is not in acute distress.    Appearance: She is obese. She is diaphoretic.  HENT:     Head: Normocephalic and atraumatic.  Cardiovascular:     Rate and Rhythm: Normal rate and regular rhythm.     Heart sounds: No murmur heard. Pulmonary:     Effort:  Pulmonary effort is normal. Tachypnea present. No respiratory distress.     Breath sounds: Examination of the right-middle field reveals rhonchi. Examination of the left-middle field reveals rhonchi. Examination of the right-lower field reveals rhonchi. Examination of the left-lower field reveals rhonchi. Rhonchi present. No wheezing.  Abdominal:     Palpations: Abdomen is soft.     Tenderness: There is no abdominal tenderness.  Musculoskeletal:     Right lower leg: No edema.     Left lower leg: No edema.  Skin:    General: Skin is warm.     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     Mental Status: She is alert and oriented to person, place, and time.  Psychiatric:        Mood and Affect: Mood normal.        Behavior: Behavior normal.    Data Reviewed: CBC with WBC 9.7, hemoglobin of 15.2, platelets of 238 CMP with sodium of 130, potassium 4.0, bicarb 22, glucose 110, BUN 19, creatinine 1.28, GFR above 60.  AST 22, ALT 16 BNP 32 Troponin 5 and then 5  Negative procalcitonin when checked on May 05, 2023 Negative D-dimer when checked on May 05, 2023  Negative COVID-19, influenza and RSV PCR negative on May 05, 2023  EKG per only reviewed.  Sinus rhythm with rate of 93.  Notched P waves concerning for pulmonary pattern.  DG Chest Port 1 View Result Date: 05/08/2023 CLINICAL DATA:  141880 SOB (shortness of breath) 141880 EXAM: PORTABLE CHEST 1 VIEW COMPARISON:  05/05/2023 FINDINGS: The heart size and mediastinal contours are within normal limits. Similar mild hyperinflation. Both lungs are clear. The visualized skeletal structures are unremarkable. IMPRESSION: No active disease. Electronically Signed   By: Melven Stable.  Shick M.D.   On: 05/08/2023 15:03   Results are pending, will review when available.  Assessment and Plan:  * COPD with acute exacerbation (HCC) Patient is presenting with several  day history of worsening shortness of breath and cough, consistent with COPD exacerbation.  - Continue supplemental oxygen to maintain oxygen saturation above 88% - Wean as tolerated - S/p Solu-Medrol  125 mg once - Start prednisone  40 mg tomorrow to complete a 5-day course - Start empiric ceftriaxone  and azithromycin  given severity - RVP and procalcitonin  pending - DuoNebs every 6 hours - Pulmicort  twice daily - Hold home bronchodilators  CAD S/P percutaneous coronary angioplasty History of CAD s/p PCI with DES to mid/distal RCA in 2023.  Chest pain with cough only at this time.  Troponin negative x 2.  - Continue home regimen  Essential hypertension Given blood pressures on the lower end of normal, will hold home antihypertensives for today and resume when stable.  - Hold home antihypertensives  DM2 (diabetes mellitus, type 2) (HCC) Well-controlled type 2 diabetes via diet only.  No indication for SSI at this time, however continue CBG monitoring given high risk for hyperglycemia in the setting of steroid use  PE (pulmonary thromboembolism) (HCC) Patient was admitted in April 2024 with acute PE, on Eliquis .  Recent D-dimer negative.  - Continue home Eliquis   Major depressive disorder, recurrent severe without psychotic features (HCC) - Resume home regimen  Advance Care Planning:   Code Status: Full Code   Consults: None  Family Communication: No family at bedside  Severity of Illness: The appropriate patient status for this patient is OBSERVATION. Observation status is judged to be reasonable and necessary in order to provide the required intensity of service to ensure the patient's safety. The patient's presenting symptoms, physical exam findings, and initial radiographic and laboratory data in the context of their medical condition is felt to place them at decreased risk for further clinical deterioration. Furthermore, it is anticipated that the patient will be medically  stable for discharge from the hospital within 2 midnights of admission.   Author: Avi Body, MD 05/08/2023 7:07 PM  For on call review [email or web address].

## 2023-05-08 NOTE — ED Notes (Signed)
 MD aware of BP trending down. BP 99/54 at this time.

## 2023-05-08 NOTE — Assessment & Plan Note (Signed)
 Patient is presenting with several day history of worsening shortness of breath and cough, consistent with COPD exacerbation.  - Continue supplemental oxygen to maintain oxygen saturation above 88% - Wean as tolerated - S/p Solu-Medrol  125 mg once - Start prednisone  40 mg tomorrow to complete a 5-day course - Start empiric ceftriaxone  and azithromycin  given severity - RVP and procalcitonin pending - DuoNebs every 6 hours - Pulmicort  twice daily - Hold home bronchodilators

## 2023-05-08 NOTE — ED Triage Notes (Signed)
 Pt in via [facility] from side of the road where pt car broke down. Pt seen here 3 days ago for the same thing. Pt has hx of COPD and asthma. Pt attempted to use inhaler before calling 911 with no relief.   Pt given the following X1 albuterol  treatment X1 duo neb 125mg  of solumedrol 324 of aspirin .  Vitals are as followed: HR-98 130/100 98% on RA BS 131

## 2023-05-08 NOTE — Assessment & Plan Note (Signed)
-   Resume home regimen

## 2023-05-08 NOTE — Assessment & Plan Note (Signed)
 Well-controlled type 2 diabetes via diet only.  No indication for SSI at this time, however continue CBG monitoring given high risk for hyperglycemia in the setting of steroid use

## 2023-05-08 NOTE — ED Provider Notes (Signed)
 [facility] Provider Note    Event Date/Time   First MD Initiated Contact with Patient 05/08/23 1325     (approximate)   History   Shortness of Breath   HPI  Jonathan Manalang. is a 58 y.o. adult who presents to the ED for evaluation of Shortness of Breath   I reviewed medical DC summary from 1 year ago.  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  Also history of COPD.  CAD, DM, obesity and OSA. Notably, patient was seen 3 days ago for shortness of breath.  Thought to be due to COPD exacerbation.  Discharged with refills of all medications.  Patient also reported living out of her car, refusing social work consultation.  D-dimer was negative  Patient returns to the ED for evaluation of "the same thing" reporting shortness of breath that persists.  Reports compliance with Eliquis .    Physical Exam   Triage Vital Signs: ED Triage Vitals  Encounter Vitals Group     BP --      Systolic BP Percentile --      Diastolic BP Percentile --      Pulse --      Resp --      Temp --      Temp src --      SpO2 05/08/23 1324 100 %     Weight 05/08/23 1325 240 lb 15.4 oz (109.3 kg)     Height 05/08/23 1325 5\' 8"  (1.727 m)     Head Circumference --      Peak Flow --      Pain Score 05/08/23 1325 6     Pain Loc --      Pain Education --      Exclude from Growth Chart --     Most recent vital signs: Vitals:   05/08/23 1324 05/08/23 1336  BP:  108/76  Pulse:  98  Resp:  (!) 24  Temp:  98.5 F (36.9 C)  SpO2: 100% 94%    General: Awake, no distress.  CV:  Good peripheral perfusion.  Resp:  Mild tachypnea to the mid 20s.  Wheezing throughout. Abd:  No distention.  MSK:  No deformity noted.  Neuro:  No focal deficits appreciated. Other:     ED Results / Procedures / Treatments   Labs (all labs ordered are listed, but only abnormal results are displayed) Labs Reviewed  CBC - Abnormal; Notable  for the following components:      Result Value   RDW 15.7 (*)    All other components within normal limits  COMPREHENSIVE METABOLIC PANEL WITH GFR - Abnormal; Notable for the following components:   Sodium 130 (*)    Chloride 97 (*)    Glucose, Bld 110 (*)    Creatinine, Ser 1.28 (*)    All other components within normal limits  BRAIN NATRIURETIC PEPTIDE  TROPONIN I (HIGH SENSITIVITY)  TROPONIN I (HIGH SENSITIVITY)    EKG Sinus rhythm with a rate of 93 bpm, normal axis and intervals without evidence of acute ischemia.  RADIOLOGY CXR interpreted by me without evidence of acute cardiopulmonary pathology.  Official radiology report(s): DG Chest Port 1 View Result Date: 05/08/2023 CLINICAL DATA:  141880 SOB (shortness of breath) 141880 EXAM: PORTABLE CHEST 1 VIEW COMPARISON:  05/05/2023 FINDINGS: The heart size and mediastinal contours are within normal limits. Similar mild hyperinflation. Both lungs are clear. The visualized  skeletal structures are unremarkable. IMPRESSION: No active disease. Electronically Signed   By: Melven Stable.  Shick M.D.   On: 05/08/2023 15:03    PROCEDURES and INTERVENTIONS:  .1-3 Lead EKG Interpretation  Performed by: Arline Bennett, MD Authorized by: Arline Bennett, MD     Interpretation: normal     ECG rate:  90   ECG rate assessment: normal     Rhythm: sinus rhythm     Ectopy: none     Conduction: normal     Medications  ipratropium-albuterol  (DUONEB) 0.5-2.5 (3) MG/3ML nebulizer solution 6 mL (6 mLs Nebulization Given 05/08/23 1411)     IMPRESSION / MDM / ASSESSMENT AND PLAN / ED COURSE  I reviewed the triage vital signs and the nursing notes.  Differential diagnosis includes, but is not limited to, ACS, PTX, PNA, muscle strain/spasm, PE, dissection, anxiety, pleural effusion  {Patient presents with symptoms of an acute illness or injury that is potentially life-threatening.  Patient presents for evaluation of continued shortness of breath and evidence  of COPD exacerbation.  Negative D-dimer a couple days ago and reports compliance with Eliquis  so I do not feel the need to repeat this or obtain CTA chest.  Clear CXR without infiltrates.  Normal CBC and negative troponin.  Signed out to oncoming physician to follow-up on repeat troponin and reassess the patient      FINAL CLINICAL IMPRESSION(S) / ED DIAGNOSES   Final diagnoses:  Shortness of breath  COPD exacerbation (HCC)     Rx / DC Orders   ED Discharge Orders     None        Note:  This document was prepared using Dragon voice recognition software and may include unintentional dictation errors.   Arline Bennett, MD 05/08/23 [phone]

## 2023-05-08 NOTE — ED Provider Notes (Signed)
 4:20 PM Assumed care for off going team.   Blood pressure 108/76, pulse 98, temperature 98.5 F (36.9 C), temperature source Oral, resp. rate (!) 24, height 5\' 8"  (1.727 m), weight 109.3 kg, SpO2 94%.  See their HPI for full report but in brief pending re-assessment.  4:25 PM patient still has diffuse wheezing noted. Sats intermittently down to 90% will do ambulatory sat.  Patient reports that she was unable to get medications prescribed last time.  [facility] so if we discharge patient she will just be right back with worsening COPD exacerbation.  Patient desatting down to 89% on room air with increased respiratory rate still diffusely wheezy.  Patient placed on 2 L of oxygen therefore will admit to the hospital team.  .Critical Care  Performed by: Lubertha Rush, MD Authorized by: Lubertha Rush, MD   Critical care provider statement:    Critical care time (minutes):  30   Critical care was necessary to treat or prevent imminent or life-threatening deterioration of the following conditions:  Respiratory failure   Critical care was time spent personally by me on the following activities:  Development of treatment plan with patient or surrogate, discussions with consultants, evaluation of patient's response to treatment, examination of patient, ordering and review of laboratory studies, ordering and review of radiographic studies, ordering and performing treatments and interventions, pulse oximetry, re-evaluation of patient's condition and review of old charts           Lubertha Rush, MD 05/08/23 1844

## 2023-05-08 NOTE — Assessment & Plan Note (Signed)
 History of CAD s/p PCI with DES to mid/distal RCA in 2023.  Chest pain with cough only at this time.  Troponin negative x 2.  - Continue home regimen

## 2023-05-09 DIAGNOSIS — Z5902 Unsheltered homelessness: Secondary | ICD-10-CM | POA: Diagnosis not present

## 2023-05-09 DIAGNOSIS — J441 Chronic obstructive pulmonary disease with (acute) exacerbation: Secondary | ICD-10-CM

## 2023-05-09 DIAGNOSIS — Z6836 Body mass index (BMI) 36.0-36.9, adult: Secondary | ICD-10-CM | POA: Diagnosis not present

## 2023-05-09 DIAGNOSIS — G4733 Obstructive sleep apnea (adult) (pediatric): Secondary | ICD-10-CM | POA: Diagnosis present

## 2023-05-09 DIAGNOSIS — Z833 Family history of diabetes mellitus: Secondary | ICD-10-CM | POA: Diagnosis not present

## 2023-05-09 DIAGNOSIS — Z87891 Personal history of nicotine dependence: Secondary | ICD-10-CM | POA: Diagnosis not present

## 2023-05-09 DIAGNOSIS — Z1152 Encounter for screening for COVID-19: Secondary | ICD-10-CM | POA: Diagnosis not present

## 2023-05-09 DIAGNOSIS — E669 Obesity, unspecified: Secondary | ICD-10-CM | POA: Diagnosis present

## 2023-05-09 DIAGNOSIS — Z7982 Long term (current) use of aspirin: Secondary | ICD-10-CM | POA: Diagnosis not present

## 2023-05-09 DIAGNOSIS — J9601 Acute respiratory failure with hypoxia: Secondary | ICD-10-CM | POA: Diagnosis present

## 2023-05-09 DIAGNOSIS — I2510 Atherosclerotic heart disease of native coronary artery without angina pectoris: Secondary | ICD-10-CM | POA: Diagnosis present

## 2023-05-09 DIAGNOSIS — Z7901 Long term (current) use of anticoagulants: Secondary | ICD-10-CM | POA: Diagnosis not present

## 2023-05-09 DIAGNOSIS — Z5901 Sheltered homelessness: Secondary | ICD-10-CM | POA: Diagnosis not present

## 2023-05-09 DIAGNOSIS — Z7951 Long term (current) use of inhaled steroids: Secondary | ICD-10-CM | POA: Diagnosis not present

## 2023-05-09 DIAGNOSIS — Z8249 Family history of ischemic heart disease and other diseases of the circulatory system: Secondary | ICD-10-CM | POA: Diagnosis not present

## 2023-05-09 DIAGNOSIS — B9789 Other viral agents as the cause of diseases classified elsewhere: Secondary | ICD-10-CM | POA: Diagnosis present

## 2023-05-09 DIAGNOSIS — E119 Type 2 diabetes mellitus without complications: Secondary | ICD-10-CM | POA: Diagnosis present

## 2023-05-09 DIAGNOSIS — Z86711 Personal history of pulmonary embolism: Secondary | ICD-10-CM | POA: Diagnosis not present

## 2023-05-09 DIAGNOSIS — E785 Hyperlipidemia, unspecified: Secondary | ICD-10-CM | POA: Diagnosis present

## 2023-05-09 DIAGNOSIS — Z800 Family history of malignant neoplasm of digestive organs: Secondary | ICD-10-CM | POA: Diagnosis not present

## 2023-05-09 DIAGNOSIS — Z955 Presence of coronary angioplasty implant and graft: Secondary | ICD-10-CM | POA: Diagnosis not present

## 2023-05-09 DIAGNOSIS — R0602 Shortness of breath: Secondary | ICD-10-CM | POA: Diagnosis present

## 2023-05-09 DIAGNOSIS — I10 Essential (primary) hypertension: Secondary | ICD-10-CM | POA: Diagnosis present

## 2023-05-09 DIAGNOSIS — F332 Major depressive disorder, recurrent severe without psychotic features: Secondary | ICD-10-CM | POA: Diagnosis present

## 2023-05-09 DIAGNOSIS — Z7984 Long term (current) use of oral hypoglycemic drugs: Secondary | ICD-10-CM | POA: Diagnosis not present

## 2023-05-09 LAB — BASIC METABOLIC PANEL WITH GFR
Anion gap: 10 (ref 5–15)
BUN: 21 mg/dL — ABNORMAL HIGH (ref 6–20)
CO2: 23 mmol/L (ref 22–32)
Calcium: 9.1 mg/dL (ref 8.9–10.3)
Chloride: 100 mmol/L (ref 98–111)
Creatinine, Ser: 0.9 mg/dL (ref 0.61–1.24)
GFR, Estimated: >60 mL/min (ref >60)
Glucose, Bld: 138 mg/dL — ABNORMAL HIGH (ref 70–99)
Potassium: 4.5 mmol/L (ref 3.5–5.1)
Sodium: 133 mmol/L — ABNORMAL LOW (ref 135–145)

## 2023-05-09 LAB — RESPIRATORY PANEL BY PCR

## 2023-05-09 LAB — CBC WITH DIFFERENTIAL/PLATELET
Abs Immature Granulocytes: 0.06 10*3/uL (ref 0.00–0.07)
Basophils Absolute: 0 10*3/uL (ref 0.0–0.1)
Basophils Relative: 0 %
Eosinophils Absolute: 0 10*3/uL (ref 0.0–0.5)
Eosinophils Relative: 0 %
HCT: 41.9 % (ref 39.0–52.0)
Hemoglobin: 14.7 g/dL (ref 13.0–17.0)
Immature Granulocytes: 1 %
Lymphocytes Relative: 5 %
Lymphs Abs: 0.5 10*3/uL — ABNORMAL LOW (ref 0.7–4.0)
MCH: 28.4 pg (ref 26.0–34.0)
MCHC: 35.1 g/dL (ref 30.0–36.0)
MCV: 80.9 fL (ref 80.0–100.0)
Monocytes Absolute: 0.3 10*3/uL (ref 0.1–1.0)
Monocytes Relative: 4 %
Neutro Abs: 8.1 10*3/uL — ABNORMAL HIGH (ref 1.7–7.7)
Neutrophils Relative %: 90 %
Platelets: 237 10*3/uL (ref 150–400)
RBC: 5.18 MIL/uL (ref 4.22–5.81)
RDW: 15.3 % (ref 11.5–15.5)
WBC: 8.9 10*3/uL (ref 4.0–10.5)
nRBC: 0 % (ref 0.0–0.2)

## 2023-05-09 LAB — GLUCOSE, CAPILLARY: Glucose-Capillary: 158 mg/dL — ABNORMAL HIGH (ref 70–99)

## 2023-05-09 LAB — HIV ANTIBODY (ROUTINE TESTING W REFLEX): HIV Screen 4th Generation wRfx: NONREACTIVE

## 2023-05-09 MED ORDER — AZITHROMYCIN 500 MG PO TABS
500.0000 mg | ORAL_TABLET | Freq: Every day | ORAL | Status: DC
  Administered 2023-05-09: 500 mg via ORAL
  Filled 2023-05-09: qty 1

## 2023-05-09 NOTE — Progress Notes (Addendum)
 Progress Note   Patient: [name]. ZOX:[id number] DOB: 1965/08/19 DOA: 05/08/2023     0 DOS: the patient was seen and examined on 05/09/2023   Brief hospital course:  Efe Bourland., preferred name [name], is a 58 y.o. adult with medical history significant of COPD, type 2 diabetes, recent PE on Eliquis , hypertension, hyperlipidemia, OSA, PUD, CAD s/p PCI with DES to RCA (October 2023), who presents to the ED due to shortness of breath.   [name] states that for the last several days, she has been experiencing gradually worsening shortness of breath and productive cough.  She states that she has a cough at baseline, however it has increased in intensity and in frequency.  She has been experiencing chest pain only while coughing, no chest pain at rest.  She denies any nausea, vomiting, diarrhea, abdominal pain, lower extremity swelling.  She has tried to use her home inhalers without relief.  She was seen in the ED for the same symptoms a few days ago and started on prednisone , however was unable to pick it up from the pharmacy.  Assessment and Plan:    COPD with acute exacerbation (HCC) Rhinovirus infection Patient is presenting with several day history of worsening shortness of breath and cough, consistent with COPD exacerbation. Continue oxygen therapy I have reviewed patient's chest x-ray that did not show infiltrate Continue prednisone  to complete 5 days course Ceftriaxone  have been discontinued Continue azithromycin  given severity Viral panel was positive for rhinovirus   CAD S/P percutaneous coronary angioplasty History of CAD s/p PCI with DES to mid/distal RCA in 2023.  Chest pain with cough only at this time.  Troponin negative x 2.   - Continue home regimen   Essential hypertension Given blood pressures on the lower end of normal, will hold home antihypertensives for today and resume when stable.   - Hold home antihypertensives   DM2 (diabetes mellitus, type 2)  (HCC) Well-controlled type 2 diabetes via diet only.  No indication for SSI at this time, however continue CBG monitoring given high risk for hyperglycemia in the setting of steroid use   PE (pulmonary thromboembolism) [facility]) Patient was admitted in April 2024 with acute PE, on Eliquis .  Recent D-dimer negative. Continue home Eliquis    Major depressive disorder, recurrent severe without psychotic features (HCC) - Resume home regimen   Advance Care Planning:   Code Status: Full Code    Consults: None   Family Communication: No family at bedside  Subjective:  Patient laying in bed on 2 L of intranasal oxygen Does not use any oxygen at home Denies nausea vomiting abdominal pain or chest pain Underwent a walk test today that patient desaturated to the mid to high 80s   Physical Exam:  General: She is not in acute distress.  Laying in bed on intranasal oxygen HENT:     Head: Normocephalic and atraumatic.  Cardiovascular:     Rate and Rhythm: Normal rate and regular rhythm.     Heart sounds: No murmur heard. Pulmonary: Clear to auscultation bilaterally Abdominal:     Palpations: Abdomen is soft.     Tenderness: There is no abdominal tenderness.  Musculoskeletal:     Right lower leg: No edema.     Left lower leg: No edema.  Skin:    General: Skin is warm.  Neurological:     General: No focal deficit present.     Mental Status: She is alert and oriented to person, place, and time.  Psychiatric:        Mood and Affect: Mood normal.        Behavior: Behavior normal.   Vitals:   05/09/23 0850 05/09/23 1000 05/09/23 1024 05/09/23 1422  BP:    108/75  Pulse:    98  Resp:    20  Temp:    98 F (36.7 C)  TempSrc:      SpO2: 100% (!) 88% 94% 95%  Weight:      Height:        Data Reviewed:     Latest Ref Rng & Units 05/09/2023    3:34 AM 05/08/2023    1:35 PM 05/05/2023    1:09 AM  CBC  WBC 4.0 - 10.5 K/uL 8.9  9.7  11.6   Hemoglobin 13.0 - 17.0 g/dL 16.1  09.6  04.5    Hematocrit 39.0 - 52.0 % 41.9  45.2  44.8   Platelets 150 - 400 K/uL 237  238  254        Latest Ref Rng & Units 05/09/2023    3:34 AM 05/08/2023    1:35 PM 05/05/2023    1:09 AM  BMP  Glucose 70 - 99 mg/dL 409  811  914   BUN 6 - 20 mg/dL 21  19  19    Creatinine 0.61 - 1.24 mg/dL 7.82  9.56  2.13   Sodium 135 - 145 mmol/L 133  130  133   Potassium 3.5 - 5.1 mmol/L 4.5  4.0  4.3   Chloride 98 - 111 mmol/L 100  97  98   CO2 22 - 32 mmol/L 23  22  25    Calcium  8.9 - 10.3 mg/dL 9.1  9.2  08.6       Disposition: Pending clinical course  Time spent: 56 minutes  Author: Ezzard Holms, MD 05/09/2023 3:51 PM  For on call review [email or web address].

## 2023-05-09 NOTE — Progress Notes (Signed)
 Occupational Therapy Evaluation Patient Details Name: [name]. MRN: [id number] DOB: 11-Jul-1965 Today's Date: 05/09/2023   History of Present Illness   Jonathan Pas., preferred name [name], is a 58 y.o. adult with medical history significant of COPD, type 2 diabetes, recent PE on Eliquis , hypertension, hyperlipidemia, OSA, PUD, CAD s/p PCI with DES to RCA (October 2023), who presents to the ED due to shortness of breath.     Clinical Impressions Pt was seen for OT evaluation this date. Prior to hospital admission, pt was indep in functional mobility, ADLs/IADLs. Pt lives in her car which is currently broken down on the side of the road were EMS found her. Pt states she has some friend support locally, unaware how much support is able to be given. Pt presents to acute OT demonstrating impaired ADL performance and functional mobility 2/2 (See OT problem list for additional functional deficits). Pt found sitting on EOB recovering from hallway amb with RN tech. Pt required ~7 mins to recover on 1L, unable to reach 90%, turned up to 2L still taking extended time before reaching 93% via [place]. Pt educated on pursed lip breathing, poor carryover of technique.  Pt currently requires CGA-supervision during amb, she often reaches for furniture for external support. Pt amb to BR with no DME + supervision, MODI pericare. Pt would benefit from skilled OT services to address noted impairments and functional limitations (see below for any additional details) in order to maximize safety and independence while minimizing falls risk and caregiver burden. Do not anticipate the need for follow up OT services upon acute hospital DC. OT will follow acutely.     If plan is discharge home, recommend the following:   A little help with walking and/or transfers;A little help with bathing/dressing/bathroom     Functional Status Assessment   Patient has had a recent decline in their functional status and  demonstrates the ability to make significant improvements in function in a reasonable and predictable amount of time.     Equipment Recommendations   Other (comment) Best boy)     Recommendations for Other Services         Precautions/Restrictions   Precautions Precautions: Fall Restrictions Weight Bearing Restrictions Per Provider Order: No     Mobility Bed Mobility               General bed mobility comments: NT seated on EOB on arrival to room, retired in Medical illustrator at end of session    Transfers Overall transfer level: Needs assistance   Transfers: Sit to/from Stand, Bed to chair/wheelchair/BSC Sit to Stand: CGA on initial STS           General transfer comment: CGA-supervision during transfers from multiple surfaces throughout session      Balance Overall balance assessment: Needs assistance Sitting-balance support: Feet supported, No upper extremity supported   Sitting balance - Comments: Steady reaching outside BOS   Standing balance support: No upper extremity supported, During functional activity Standing balance-Leahy Scale: Fair Standing balance comment: Limitied by SOB during amb                           ADL either performed or assessed with clinical judgement   ADL Overall ADL's : Needs assistance/impaired                     Lower Body Dressing: Modified independent (Declined LB socks, stated she doesnt wear them, donned  personal slides in sitting)   Toilet Transfer: Regular Toilet;Contact guard assist           Functional mobility during ADLs: Contact guard assist General ADL Comments: Donned personal slides seated at EOB, toilet t/f supervision with side rails, declined UE/LB bathing and underwear changing on this date      Pertinent Vitals/Pain Pain Assessment Pain Assessment: 0-10 Pain Score: 6  Pain Location: Chest Pain Descriptors / Indicators: Pressure Pain Intervention(s): Limited activity within  patient's tolerance, Monitored during session     Extremity/Trunk Assessment Upper Extremity Assessment Upper Extremity Assessment: Overall WFL for tasks assessed   Lower Extremity Assessment Lower Extremity Assessment: Overall WFL for tasks assessed;Generalized weakness   Cervical / Trunk Assessment Cervical / Trunk Assessment: Normal   Communication Communication Communication: No apparent difficulties   Cognition Arousal: Alert Behavior During Therapy: WFL for tasks assessed/performed Cognition: No apparent impairments             OT - Cognition Comments: A/Ox4                 Following commands: Intact       Cueing  General Comments   Cueing Techniques: Verbal cues      Exercises Exercises: Other exercises Other Exercises Other Exercises: Edu: Role of OT eval,   Shoulder Instructions      Home Living Family/patient expects to be discharged to:: Shelter/Homeless                                 Additional Comments: Lives out of car, parked is various places      Prior Functioning/Environment Prior Level of Function : Driving;Independent/Modified Independent;History of Falls (last six months)             Mobility Comments: Indep ADLs Comments: Indep    OT Problem List: Decreased activity tolerance;Impaired balance (sitting and/or standing);Decreased coordination;Decreased safety awareness;Decreased knowledge of use of DME or AE;Decreased knowledge of precautions   OT Treatment/Interventions: Self-care/ADL training;DME and/or AE instruction;Energy conservation;Therapeutic activities;Patient/family education;Balance training      OT Goals(Current goals can be found in the care plan section)   Acute Rehab OT Goals Patient Stated Goal: Get her car fixed OT Goal Formulation: With patient Time For Goal Achievement: 05/23/23 Potential to Achieve Goals: Good ADL Goals Pt Will Perform Grooming: Independently;standing Pt Will  Perform Lower Body Dressing: Independently;sit to/from stand Pt Will Transfer to Toilet: Independently;ambulating Pt Will Perform Toileting - Clothing Manipulation and hygiene: Independently   OT Frequency:  Min 1X/week    Co-evaluation              AM-PAC OT "6 Clicks" Daily Activity     Outcome Measure Help from another person eating meals?: None Help from another person taking care of personal grooming?: A Little Help from another person toileting, which includes using toliet, bedpan, or urinal?: None Help from another person bathing (including washing, rinsing, drying)?: A Little Help from another person to put on and taking off regular upper body clothing?: None Help from another person to put on and taking off regular lower body clothing?: None 6 Click Score: 22   End of Session Equipment Utilized During Treatment: Gait belt Nurse Communication: Mobility status  Activity Tolerance: Patient tolerated treatment well Patient left: in chair;with call bell/phone within reach;with chair alarm set  OT Visit Diagnosis: Unsteadiness on feet (R26.81);Other abnormalities of gait and mobility (R26.89);Muscle weakness (generalized) (M62.81)  Time: 1020-1045 OT Time Calculation (min): 25 min Charges:  OT General Charges $OT Visit: 1 Visit OT Evaluation $OT Eval Low Complexity: 1 Low OT Treatments $Self Care/Home Management : 8-22 mins  Rosaria Common M.S. OTR/L  05/09/23, 2:08 PM

## 2023-05-09 NOTE — Progress Notes (Signed)
 Physical Therapy Evaluation Patient Details Name: [name]. MRN: [id number] DOB: [birth year] Today's Date: 05/09/2023  History of Present Illness  [name] "[name]" is a 58 y/o transgender male admitted secondary to SOB and found to have a COPD exacerbation. PMH including but not limited to COPD, type 2 diabetes, recent PE on Eliquis , hypertension, hyperlipidemia, OSA, PUD, CAD s/p PCI with DES to RCA (October 2023).   Clinical Impression  Pt presented sitting up in recliner chair, awake and willing to participate in therapy session. Prior to admission, pt reported that she was independent with all functional mobility and ADLs. Per pt report, she lives in her car which is currently broken down on the side of the road were EMS found her. Pt states she has some friend support locally, unaware how much support is able to be given at this time. At the time of evaluation, pt able to complete transfers at a supervision to mod I level and ambulated within her room without an AD with supervision from PT for safety. Pt with mild instability and frequently reaching for support surfaces, would like to trial a cane at next session. PT will continue to follow-up with pt acutely to progress mobility as tolerated and to assist with any equipment needs.         If plan is discharge home, recommend the following: A little help with walking and/or transfers;A little help with bathing/dressing/bathroom;Assist for transportation   Can travel by private vehicle        Equipment Recommendations Other (comment) (TBD - trial cane at next session)  Recommendations for Other Services       Functional Status Assessment Patient has had a recent decline in their functional status and demonstrates the ability to make significant improvements in function in a reasonable and predictable amount of time.     Precautions / Restrictions Precautions Precautions: Fall Restrictions Weight Bearing  Restrictions Per Provider Order: No      Mobility  Bed Mobility Overal bed mobility: Modified Independent                  Transfers Overall transfer level: Needs assistance Equipment used: None Transfers: Sit to/from Stand Sit to Stand: Supervision           General transfer comment: supervision to stand from recliner chair x1, mod I to stand from toilet    Ambulation/Gait Ambulation/Gait assistance: Supervision Gait Distance (Feet): 40 Feet (20' x2) Assistive device: None Gait Pattern/deviations: Step-through pattern, Decreased stride length Gait velocity: decreased     General Gait Details: pt with slow, cautious gait without use of an AD but frequently reaching for support surfaces; pt refusing RW this date but agreeable to trial cane at next session. pt with one minor LOB but able to self-correct.  Stairs            Wheelchair Mobility     Tilt Bed    Modified Rankin (Stroke Patients Only)       Balance Overall balance assessment: Needs assistance Sitting-balance support: Feet supported, No upper extremity supported Sitting balance-Leahy Scale: Good     Standing balance support: No upper extremity supported, During functional activity Standing balance-Leahy Scale: Fair                               Pertinent Vitals/Pain Pain Assessment Pain Assessment: No/denies pain Pain Intervention(s): Monitored during session    Home Living  Family/patient expects to be discharged to:: Shelter/Homeless                   Additional Comments: Lives out of car, parked is various places    Prior Function Prior Level of Function : Driving;Independent/Modified Independent;History of Falls (last six months)             Mobility Comments: Indep ADLs Comments: Indep     Extremity/Trunk Assessment   Upper Extremity Assessment Upper Extremity Assessment: Overall WFL for tasks assessed    Lower Extremity Assessment Lower  Extremity Assessment: Overall WFL for tasks assessed    Cervical / Trunk Assessment Cervical / Trunk Assessment: Normal  Communication   Communication Communication: No apparent difficulties    Cognition Arousal: Alert Behavior During Therapy: WFL for tasks assessed/performed   PT - Cognitive impairments: No apparent impairments                         Following commands: Intact       Cueing Cueing Techniques: Verbal cues     General Comments      Exercises     Assessment/Plan    PT Assessment Patient needs continued PT services  PT Problem List Cardiopulmonary status limiting activity;Decreased activity tolerance;Decreased balance;Decreased mobility;Decreased knowledge of use of DME;Decreased safety awareness       PT Treatment Interventions DME instruction;Gait training;Functional mobility training;Stair training;Therapeutic activities;Therapeutic exercise;Balance training;Neuromuscular re-education;Patient/family education    PT Goals (Current goals can be found in the Care Plan section)  Acute Rehab PT Goals Patient Stated Goal: to feel better PT Goal Formulation: With patient Time For Goal Achievement: 05/23/23 Potential to Achieve Goals: Good    Frequency Min 2X/week     Co-evaluation               AM-PAC PT "6 Clicks" Mobility  Outcome Measure Help needed turning from your back to your side while in a flat bed without using bedrails?: None Help needed moving from lying on your back to sitting on the side of a flat bed without using bedrails?: None Help needed moving to and from a bed to a chair (including a wheelchair)?: None Help needed standing up from a chair using your arms (e.g., wheelchair or bedside chair)?: None Help needed to walk in hospital room?: A Little Help needed climbing 3-5 steps with a railing? : A Little 6 Click Score: 22    End of Session Equipment Utilized During Treatment: Oxygen Activity Tolerance: Patient  tolerated treatment well Patient left: in bed;with call bell/phone within reach Nurse Communication: Mobility status PT Visit Diagnosis: Other abnormalities of gait and mobility (R26.89)    Time: 8119-1478 PT Time Calculation (min) (ACUTE ONLY): 17 min   Charges:   PT Evaluation $PT Eval Moderate Complexity: 1 Mod   PT General Charges $$ ACUTE PT VISIT: 1 Visit         Classie Cruise, DPT  Acute Rehabilitation Services Office [phone]   Leory Rands Haydan Mansouri 05/09/2023, 2:18 PM

## 2023-05-09 NOTE — Discharge Instructions (Signed)
 Shelters Resource List  [employer] [facility] PROVIDED BY: [facility] [address] [place], [place], [place] Offers a faith-based shelter for homeless men, usually with substance use disorders. Residents receive counseling, life skills training, and help finding a job.  HOMELESS SHELTER PROVIDED BY: [facility] [address] [place], [place], [place] Offers a shelter for men, women, and families experiencing homelessness. Food, clothing and other items are available for residents. Also offers support and services to help residents become self-sufficient. Offers temporary emergency housing for 30 days. Additional shelter may be available when temperatures drop below freezing but is not guaranteed.  FAMILY ABUSE SERVICES OF [facility] COUNTY PROVIDED BY: FAMILY [facility]BUSE SERVICES OF [facility] [address], [place], [place] Offers services for victims of domestic violence. Offers a 24-hour crisis line and emergency shelter. Offers information and referrals to other community resources. Also offers court advocacy and support groups.  HOUSING CHOICE VOUCHER PROGRAM PROVIDED BY: [facility] [place] [address], [place], [place] Offers vouchers for approved Section 8 properties. Vouchers offer financial help with rent    [employer] [facility] PROVIDED BY: [facility], [place], [place] Offers emergency shelter for victims of domestic violence. Also offers a 24-hour crisis hotline for victims of domestic violence, safety planning, information and referrals, case management, and support groups for victims of domestic violence.   ACT TOGETHER EMERGENCY SHELTER PROVIDED BY: [facility] [address], [place], [place] Offers a 21-day emergency shelter for youth experiencing a family crisis, abuse, or homelessness. Case management, supportive services, healthcare services, and more are available for  residents. SHELTER PROVIDED BY: [facility] [address], [place], [place] Offers a homeless shelter for people and families. Meals, showers, community referrals, case management, and more are available for residents. HEARTH TRANSITIONAL LIVING PROGRAM PROVIDED BY: [facility] [address], [place], [place] Offers an 34-month homeless shelter for younger adults experiencing homelessness. Case management, independent living skills education, and more are available for residents. [facility]NERSHIP [facility]ILLAGE PROVIDED BY: [facility] [address], [place], [place] Offers transitional housing for families and single people experiencing homelessness. Residents meet regularly with a case manager to work towards self-sufficiency TRANSITIONAL HOUSING PROVIDED BY: [facility] [address], [place], [place] Offers transitional housing for male veterans with disabilities. Residents receive meals, transportation, and clothing. Also offers support groups, nutrition classes, and peer support to residents.   WEAVER HOUSE PROVIDED BY: [place] [facility] [address], [place], [place] Offers shelter to adult men and women. Guests receive hot meals and case management. Also offers overnight shelter when temperatures drop during cold winter months  EMERGENCY FAMILY SHELTER PROVIDED BY: [facility] [address], [place],  Offers shelter and support services for families experiencing homelessness.  Rent/Utility/Housing  Agency Name: [facility] Address: [address] [place], [place] [address] Phone: [phone] Email: [email or web address] Website: www.alamanceservices.org Service(s) Offered: Housing services, self-sufficiency, congregate meal program, weatherization program, Field seismologist program, emergency food assistance,  housing counseling, home ownership program, wheels  -towork program.  Agency Name: [facility] Address: [address] [place], [place] [address] Phone: [phone] (8a-4p) [phone] (8p- 10p) Email: [email or web address] Website: [email or web address].[email or web address].org Service(s) Offered: A program for homeless and/or needy men that includes one-on-one counseling, life skills training and job rehabilitation.  Agency Name: [facility] [place] Address: [address] [place], [place] [address] Phone: [phone] Website: www.alliedchurches.org Service(s) Offered: Assistance to needy in emergency with utility bills,  heating fuel, and prescriptions. Shelter for homeless 7pm-7am. May 07, 2016 15  Agency Name: [facility] [place] (Developmentally Disabled) Address: [address], [place], [place] [address] Phone: [phone] Contact Person: Genie Key Email: [email or web address] Website: LinkWedding.ca Service(s) Offered: Helps individuals with developmental disabilities move from housing that is more restrictive to homes where they  can achieve greater independence and have more  opportunities.  Agency Name: [facility] Address: [address], [place], [place] [address] Phone: [phone] Email: [email or web address] Website: www.[facility]authority.org Service(s) Offered: Provides affordable housing for low-income families, elderly, and disabled individuals. Offer a wide range of  programs and services, from financial planning to afterschool and summer programs.  Agency Name: [facility] Address: [address] [place], [place] [address] Phone: [phone] Service(s) Offered: Child support services; child welfare services; food stamps; Medicaid; work first family assistance; and aid with fuel,  rent, food and medicine.  Agency Name: [facility] [place] [employer]. Address: [facility] [address], [place], [place]   [address] Phone: [phone] Website: www.familyabuseservices.org Service(s) Offered: 24 hour Crisis Line: [phone]; 24 hour Emergency Shelter; Transitional Housing; Support Groups; Scientist, physiological; [employer]; Hispanic Outreach: [phone];  Visitation Center: [phone].  Agency Name: [facility] [place]. Address: [address] [place], [place] [address] Phone: [phone] Service(s) Offered: CAP Services; Home and [employer]; Individual or Group Supports; Respite Care Non-Institutional Nursing;  Residential Supports; Respite Care and Personal Care Services; Transportation; Family and Friends Night; Recreational Activities; Three Nutritious Meals/Snacks; Consultation with Registered Dietician; Twenty-four hour Registered Nurse Access; Daily and [employer]; Camp Green Leaves; Winter for the [employer] (During Summer Months) Bingo Night (Every  Wednesday Night); Special Populations Dance Night  (Every Tuesday Night); Professional Hair Care Services.  Agency Name: [facility] Address: [address], [place], [place] [address] Phone: [phone] Contact Person: Richardo Chandler Website: [email or web address].Physicist, medical) Offered: Residential treatment facility for women; food and  clothing, educational & employment development and  transportation to work; Counsellor of financial skills;  parenting and family reunification; emotional and spiritual  support; transitional housing for program graduates.  Agency Name: [facility] Address: [address] [place], [place] [address] Phone: [phone] Email: [email or web address] Website: TaskTown.es Service(s) Offered: Public housing units for elderly, disabled, and low income people; housing choice vouchers for income eligible  applicants; shelter plus care vouchers; and Psychologist, clinical.  Agency Name: [facility]  [employer] Address: [address] [place], [place] [address] Phone: [phone] Email: habitat1@netzero .net Website: www.habitatalamance.org Service(s) Offered: Build houses for families in need of decent housing. Each adult in the family must invest 200 hours of labor on  someone else's house, work with volunteers to build their own house, attend classes on budgeting, home maintenance, yard care, and attend homeowner association meetings.  Agency Name: [facility] Address: [address] [place], [place] [address] Phone: [phone] Website: www.rsli.org Service(s) Offered: Intermediate care facilities for intellectually delayed, Supervised Living in group homes for adults with developmental disabilities, Supervised Living for people who have dual diagnoses (MRMI), Independent Living, Supported Living, respite and a variety of CAP services, pre-vocational services, day supports, and [employer].  Agency Name: [place] [employer] Phone: [phone] Website: www.NCForeclosurePrevention.gov Service(s) Offered: Zero-interest, deferred loans to homeowners struggling to pay their mortgage. Call for more information.

## 2023-05-09 NOTE — Care Management Obs Status (Signed)
 MEDICARE OBSERVATION STATUS NOTIFICATION   Patient Details  Name: [name]. MRN: [id number] Date of Birth: February 21, 1965   Medicare Observation Status Notification Given:  Yes    Holland Lundborg, RN 05/09/2023, 11:02 AM

## 2023-05-10 DIAGNOSIS — J441 Chronic obstructive pulmonary disease with (acute) exacerbation: Secondary | ICD-10-CM | POA: Diagnosis not present

## 2023-05-10 LAB — GLUCOSE, CAPILLARY
Glucose-Capillary: 109 mg/dL — ABNORMAL HIGH (ref 70–99)
Glucose-Capillary: 127 mg/dL — ABNORMAL HIGH (ref 70–99)

## 2023-05-10 MED ORDER — PREDNISONE 20 MG PO TABS: 40.0000 mg | ORAL_TABLET | Freq: Every day | ORAL | 0 refills | Status: AC

## 2023-05-10 MED ORDER — AZITHROMYCIN 500 MG PO TABS: 500.0000 mg | ORAL_TABLET | Freq: Every day | ORAL | 0 refills | Status: AC

## 2023-05-10 NOTE — Plan of Care (Signed)
 Alert and Oriented x4. Denies pain this shift. Afebrile. Ambulates independently to bathroom and in room. O2 94% at rest with no O2. Cough and congestion continue. Audible wheezing noted. Verbalizes knowledge of disease process. No skin breakdown noted. Has remained free from injury this shift. Problem: Education: Goal: Knowledge of General Education information will improve Description: Including pain rating scale, medication(s)/side effects and non-pharmacologic comfort measures Outcome: Progressing   Problem: Health Behavior/Discharge Planning: Goal: Ability to manage health-related needs will improve Outcome: Progressing   Problem: Clinical Measurements: Goal: Ability to maintain clinical measurements within normal limits will improve Outcome: Progressing Goal: Will remain free from infection Outcome: Progressing Goal: Diagnostic test results will improve Outcome: Progressing Goal: Respiratory complications will improve Outcome: Progressing Goal: Cardiovascular complication will be avoided Outcome: Progressing   Problem: Activity: Goal: Risk for activity intolerance will decrease Outcome: Progressing   Problem: Coping: Goal: Level of anxiety will decrease Outcome: Progressing   Problem: Elimination: Goal: Will not experience complications related to bowel motility Outcome: Progressing Goal: Will not experience complications related to urinary retention Outcome: Progressing   Problem: Pain Managment: Goal: General experience of comfort will improve and/or be controlled Outcome: Progressing   Problem: Safety: Goal: Ability to remain free from injury will improve Outcome: Progressing   Problem: Skin Integrity: Goal: Risk for impaired skin integrity will decrease Outcome: Progressing   Problem: Education: Goal: Knowledge of disease or condition will improve Outcome: Progressing   Problem: Respiratory: Goal: Ability to maintain a clear airway will improve Outcome:  Progressing Goal: Levels of oxygenation will improve Outcome: Progressing Goal: Ability to maintain adequate ventilation will improve Outcome: Progressing

## 2023-05-10 NOTE — Discharge Summary (Signed)
 Physician Discharge Summary   Patient: [name]. MRN: [id number] DOB: 10-23-1965  Admit date:     05/08/2023  Discharge date: 05/10/23  Discharge Physician: Ezzard Holms   PCP: Evette Hoes, NP   Recommendations at discharge:    Discharge Diagnoses:  COPD with acute exacerbation (HCC) Rhinovirus infection CAD S/P percutaneous coronary angioplasty Essential hypertension DM2 (diabetes mellitus, type 2) (HCC) PE (pulmonary thromboembolism) (HCC) Major depressive disorder, recurrent severe without psychotic features [facility])   Hospital Course:  Curlee Doss., preferred name [name], is a 58 y.o. adult with medical history significant of COPD, type 2 diabetes, recent PE on Eliquis , hypertension, hyperlipidemia, OSA, PUD, CAD s/p PCI with DES to RCA (October 2023), who presents to the ED due to shortness of breath. [name] states that for the last several days, she has been experiencing gradually worsening shortness of breath and productive cough.  She states that she has a cough at baseline, however it has increased in intensity and in frequency.  Respiratory panel detected rhinovirus infection.  Patient's oxygen requirement improved he was weaned off oxygen to room air today.  Underwent a walk test that showed the patient does not meet criteria for home oxygen.  Patient is therefore being discharged home to homeless shelter today   Consultants: None Procedures performed: None Disposition: Homeless shelter Diet recommendation:  Cardiac diet DISCHARGE MEDICATION: Allergies as of 05/10/2023       Reactions   Shellfish Allergy Swelling   Codeine Rash   Lisinopril Cough        Medication List     STOP taking these medications    spironolactone  100 MG tablet Commonly known as: ALDACTONE    spironolactone  50 MG tablet Commonly known as: ALDACTONE        TAKE these medications    albuterol  108 (90 Base) MCG/ACT inhaler Commonly known as: VENTOLIN  HFA Inhale 2  puffs into the lungs every 4 (four) hours as needed for wheezing or shortness of breath.   apixaban  5 MG Tabs tablet Commonly known as: ELIQUIS  Take 1 tablet (5 mg total) by mouth 2 (two) times daily.   ARIPiprazole  5 MG tablet Commonly known as: ABILIFY  Take 5 mg by mouth daily.   aspirin  EC 81 MG tablet Take 1 tablet (81 mg total) by mouth daily. Swallow whole.   aspirin -acetaminophen -caffeine  250-250-65 MG tablet Commonly known as: EXCEDRIN  MIGRAINE Take 1 tablet by mouth every 6 (six) hours as needed for headache.   atorvastatin  40 MG tablet Commonly known as: LIPITOR  Take 1 tablet (40 mg total) by mouth daily.   azithromycin  500 MG tablet Commonly known as: ZITHROMAX  Take 1 tablet (500 mg total) by mouth daily at 6 PM for 3 days.   Breztri Aerosphere 160-9-4.8 MCG/ACT Aero inhaler Generic drug: budeson-glycopyrrolate-formoterol  Inhale 2 puffs into the lungs 2 (two) times daily.   escitalopram  20 MG tablet Commonly known as: LEXAPRO  Take 20 mg by mouth daily.   estradiol  0.1 mg/24hr patch Commonly known as: CLIMARA  - Dosed in mg/24 hr Place 0.1 mg onto the skin once a week.   ezetimibe  10 MG tablet Commonly known as: ZETIA  Take 1 tablet (10 mg total) by mouth daily.   famotidine  40 MG tablet Commonly known as: PEPCID  Take 40 mg by mouth daily.   gabapentin  300 MG capsule Commonly known as: NEURONTIN  Take 300 mg by mouth 3 (three) times daily.   ipratropium-albuterol  0.5-2.5 (3) MG/3ML Soln Commonly known as: DUONEB Take 3 mLs by nebulization every 4 (  four) hours as needed.   loratadine  10 MG tablet Commonly known as: CLARITIN  Take 10 mg by mouth daily.   metFORMIN  1000 MG tablet Commonly known as: GLUCOPHAGE  Take 1 tablet (1,000 mg total) by mouth daily with breakfast.   oxybutynin  5 MG tablet Commonly known as: DITROPAN  Take 5 mg by mouth every 12 (twelve) hours.   pantoprazole  40 MG tablet Commonly known as: Protonix  Take 1 tablet (40 mg total)  by mouth daily. What changed: when to take this   predniSONE  20 MG tablet Commonly known as: DELTASONE  Take 2 tablets (40 mg total) by mouth daily with breakfast for 3 days. Start taking on: May 11, 2023   simethicone  125 MG chewable tablet Commonly known as: MYLICON Chew 125 mg by mouth every 6 (six) hours as needed for flatulence.        Discharge Exam: Filed Weights   05/08/23 1325  Weight: 109.3 kg   ENT:     Head: Normocephalic and atraumatic.  Cardiovascular:     Rate and Rhythm: Normal rate and regular rhythm.     Heart sounds: No murmur heard. Pulmonary: Clear to auscultation bilaterally Abdominal:     Palpations: Abdomen is soft.     Tenderness: There is no abdominal tenderness.  Musculoskeletal:     Right lower leg: No edema.     Left lower leg: No edema.  Skin:    General: Skin is warm.  Neurological:     General: No focal deficit present.     Mental Status: She is alert and oriented to person, place, and time.  Psychiatric:        Mood and Affect: Mood normal.        Behavior: Behavior normal.   Condition at discharge: good  The results of significant diagnostics from this hospitalization (including imaging, microbiology, ancillary and laboratory) are listed below for reference.   Imaging Studies: DG Chest Port 1 View Result Date: 05/08/2023 CLINICAL DATA:  [id number] SOB (shortness of breath) [id number] EXAM: PORTABLE CHEST 1 VIEW COMPARISON:  05/05/2023 FINDINGS: The heart size and mediastinal contours are within normal limits. Similar mild hyperinflation. Both lungs are clear. The visualized skeletal structures are unremarkable. IMPRESSION: No active disease. Electronically Signed   By: Melven Stable.  Shick M.D.   On: 05/08/2023 15:03   DG Chest Portable 1 View Result Date: 05/05/2023 CLINICAL DATA:  Shortness of breath and wheezing EXAM: PORTABLE CHEST 1 VIEW COMPARISON:  Radiograph and CT 05/09/2022 FINDINGS: Stable cardiomediastinal silhouette. Abnormal increased  interstitial and bronchial markings in the lower lungs, though less conspicuous compared to 05/09/2022. No focal consolidation, pleural effusion, or pneumothorax. No displaced rib fractures. IMPRESSION: Findings suggest lower lobe bronchitis. Electronically Signed   By: Rozell Cornet M.D.   On: 05/05/2023 01:19    Microbiology: Results for orders placed or performed during the hospital encounter of 05/08/23  Resp panel by RT-PCR (RSV, Flu A&B, Covid) Anterior Nasal Swab     Status: None   Collection Time: 05/08/23  5:55 PM   Specimen: Anterior Nasal Swab  Result Value Ref Range Status   SARS Coronavirus 2 by RT PCR NEGATIVE NEGATIVE Final    Comment: (NOTE) SARS-CoV-2 target nucleic acids are NOT DETECTED.  The SARS-CoV-2 RNA is generally detectable in upper respiratory specimens during the acute phase of infection. The lowest concentration of SARS-CoV-2 viral copies this assay can detect is 138 copies/mL. A negative result does not preclude SARS-Cov-2 infection and should not be used as the sole basis  for treatment or other patient management decisions. A negative result may occur with  improper specimen collection/handling, submission of specimen other than nasopharyngeal swab, presence of viral mutation(s) within the areas targeted by this assay, and inadequate number of viral copies(<138 copies/mL). A negative result must be combined with clinical observations, patient history, and epidemiological information. The expected result is Negative.  Fact Sheet for Patients:  BloggerCourse.com  Fact Sheet for Healthcare Providers:  SeriousBroker.it  This test is no t yet approved or cleared by the [place]  FDA and  has been authorized for detection and/or diagnosis of SARS-CoV-2 by FDA under an Emergency Use Authorization (EUA). This EUA will remain  in effect (meaning this test can be used) for the duration of the COVID-19  declaration under Section 564(b)(1) of the Act, 21 U.S.C.section 360bbb-3(b)(1), unless the authorization is terminated  or revoked sooner.       Influenza A by PCR NEGATIVE NEGATIVE Final   Influenza B by PCR NEGATIVE NEGATIVE Final    Comment: (NOTE) The Xpert Xpress SARS-CoV-2/FLU/RSV plus assay is intended as an aid in the diagnosis of influenza from Nasopharyngeal swab specimens and should not be used as a sole basis for treatment. Nasal washings and aspirates are unacceptable for Xpert Xpress SARS-CoV-2/FLU/RSV testing.  Fact Sheet for Patients: BloggerCourse.com  Fact Sheet for Healthcare Providers: SeriousBroker.it  This test is not yet approved or cleared by the [place]  FDA and has been authorized for detection and/or diagnosis of SARS-CoV-2 by FDA under an Emergency Use Authorization (EUA). This EUA will remain in effect (meaning this test can be used) for the duration of the COVID-19 declaration under Section 564(b)(1) of the Act, 21 U.S.C. section 360bbb-3(b)(1), unless the authorization is terminated or revoked.     Resp Syncytial Virus by PCR NEGATIVE NEGATIVE Final    Comment: (NOTE) Fact Sheet for Patients: BloggerCourse.com  Fact Sheet for Healthcare Providers: SeriousBroker.it  This test is not yet approved or cleared by the [place]  FDA and has been authorized for detection and/or diagnosis of SARS-CoV-2 by FDA under an Emergency Use Authorization (EUA). This EUA will remain in effect (meaning this test can be used) for the duration of the COVID-19 declaration under Section 564(b)(1) of the Act, 21 U.S.C. section 360bbb-3(b)(1), unless the authorization is terminated or revoked.  Performed at [facility], [address] [place], [place] [address]   Respiratory (~20 pathogens) panel by PCR     Status: Abnormal   Collection Time:  05/08/23  6:53 PM   Specimen: Nasopharyngeal Swab; Respiratory  Result Value Ref Range Status   Adenovirus NOT DETECTED NOT DETECTED Final   Coronavirus 229E NOT DETECTED NOT DETECTED Final    Comment: (NOTE) The Coronavirus on the Respiratory Panel, DOES NOT test for the novel  Coronavirus (2019 nCoV)    Coronavirus HKU1 NOT DETECTED NOT DETECTED Final   Coronavirus NL63 NOT DETECTED NOT DETECTED Final   Coronavirus OC43 NOT DETECTED NOT DETECTED Final   Metapneumovirus NOT DETECTED NOT DETECTED Final   Rhinovirus / Enterovirus DETECTED (A) NOT DETECTED Final   Influenza A NOT DETECTED NOT DETECTED Final   Influenza B NOT DETECTED NOT DETECTED Final   Parainfluenza Virus 1 NOT DETECTED NOT DETECTED Final   Parainfluenza Virus 2 NOT DETECTED NOT DETECTED Final   Parainfluenza Virus 3 NOT DETECTED NOT DETECTED Final   Parainfluenza Virus 4 NOT DETECTED NOT DETECTED Final   Respiratory Syncytial Virus NOT DETECTED NOT DETECTED Final   Bordetella pertussis  NOT DETECTED NOT DETECTED Final   Bordetella Parapertussis NOT DETECTED NOT DETECTED Final   Chlamydophila pneumoniae NOT DETECTED NOT DETECTED Final   Mycoplasma pneumoniae NOT DETECTED NOT DETECTED Final    Comment: Performed at [facility], [address], [place], [place] [address]    Labs: CBC: Recent Labs  Lab 05/05/23 0109 05/08/23 1335 05/09/23 0334  WBC 11.6* 9.7 8.9  NEUTROABS 9.4*  --  8.1*  HGB 14.5 15.2 14.7  HCT 44.8 45.2 41.9  MCV 85.7 83.7 80.9  PLT 254 238 237   Basic Metabolic Panel: Recent Labs  Lab 05/05/23 0109 05/05/23 0233 05/08/23 1335 05/09/23 0334  NA 133*  --  130* 133*  K 4.3  --  4.0 4.5  CL 98  --  97* 100  CO2 25  --  22 23  GLUCOSE 104*  --  110* 138*  BUN 19  --  19 21*  CREATININE 1.15  --  1.28* 0.90  CALCIUM  10.0  --  9.2 9.1  MG  --  2.0  --   --    Liver Function Tests: Recent Labs  Lab 05/08/23 1335  AST 22  ALT 16  ALKPHOS 76  BILITOT 0.9  PROT 8.1   ALBUMIN 3.7   CBG: Recent Labs  Lab 05/05/23 0455 05/09/23 0543 05/10/23 0454 05/10/23 0832  GLUCAP 145* 158* 109* 127*    Discharge time spent:  .  Signed: Ezzard Holms, MD [facility] 05/10/2023

## 2023-05-10 NOTE — TOC Transition Note (Signed)
 Transition of Care [facility]) - Discharge Note   Patient Details  Name: [name]. MRN: [id number] Date of Birth: 04/02/1965  Transition of Care [facility]) CM/SW Contact:  Elsie Halo, RN Phone Number: 05/10/2023, 2:56 PM   Clinical Narrative:    Patient is medically clear for dc to home. TOC provided a taxi voucher.    Final next level of care: Home/Self Care Barriers to Discharge: No Barriers Identified   Patient Goals and CMS Choice            Discharge Placement                       Discharge Plan and Services Additional resources added to the After Visit Summary for                                       Social Drivers of Health (SDOH) Interventions SDOH Screenings   Food Insecurity: Food Insecurity Present (05/08/2023)  Housing: High Risk (05/08/2023)  Transportation Needs: Unmet Transportation Needs (05/08/2023)  Utilities: Not At Risk (05/08/2023)  Recent Concern: Utilities - At Risk (05/08/2023)  Depression (PHQ2-9): High Risk (12/09/2020)  Social Connections: Socially Isolated (05/08/2023)  Tobacco Use: Medium Risk (05/08/2023)     Readmission Risk Interventions     No data to display

## 2023-05-10 NOTE — TOC Initial Note (Signed)
 Transition of Care (TOC) - Initial/Assessment Note    Patient Details  Name: [name]. MRN: [id number] Date of Birth: Nov 30, 1965  Transition of Care [facility]) CM/SW Contact:    Elsie Halo, RN Phone Number: 05/10/2023, 1:33 PM  Clinical Narrative:                  Transition of Care Department [facility]) has reviewed patient and no TOC needs have been identified at this time.  Resources for housing, food, and transportation added to the patient's AVS.  If new transition needs arise, please outreach to [facility].       Patient Goals and CMS Choice            Expected Discharge Plan and Services                                              Prior Living Arrangements/Services                       Activities of Daily Living   ADL Screening (condition at time of admission) Independently performs ADLs?: Yes (appropriate for developmental age) Is the patient deaf or have difficulty hearing?: Yes Does the patient have difficulty seeing, even when wearing glasses/contacts?: No Does the patient have difficulty concentrating, remembering, or making decisions?: No  Permission Sought/Granted                  Emotional Assessment              Admission diagnosis:  Shortness of breath [R06.02] COPD exacerbation (HCC) [J44.1] Acute respiratory failure with hypoxia (HCC) [J96.01] COPD with acute exacerbation (HCC) [J44.1] Acute hypoxic respiratory failure (HCC) [J96.01] Patient Active Problem List   Diagnosis Date Noted   Acute hypoxic respiratory failure (HCC) 05/09/2023   CAD S/P percutaneous coronary angioplasty 05/08/2023   Major depressive disorder, recurrent severe without psychotic features (HCC) 05/11/2022   PE (pulmonary thromboembolism) (HCC) 05/10/2022   Non-ST elevation (NSTEMI) myocardial infarction (HCC)    Stable angina (HCC) 11/09/2021   COPD with acute exacerbation (HCC) 08/26/2021   COPD exacerbation (HCC) 08/25/2021    Dyslipidemia 08/25/2021   GERD without esophagitis 08/25/2021   Type 2 diabetes mellitus with peripheral neuropathy (HCC) 08/25/2021   Acute respiratory failure with hypoxia (HCC) 08/25/2021   Overdose 09/30/2020   Suicidal ideation 09/30/2020   Acute on chronic respiratory failure with hypoxia (HCC) 09/30/2020   AKI (acute kidney injury) (HCC) 09/30/2020   Acute encephalopathy 09/30/2020   Lactic acidosis 09/30/2020   Depression 09/30/2020   Venous stasis dermatitis of both lower extremities 08/17/2019   Pain due to onychomycosis of toenails of both feet 05/15/2019   Chronic low back pain (1ry area of Pain) (Bilateral) w/o sciatica 03/09/2019   Bilateral lower leg cellulitis 06/27/2018   Osteoarthritis of facet joint of lumbar spine 02/14/2018   Osteoarthritis involving multiple joints 02/14/2018   Chronic musculoskeletal pain 02/14/2018   Neurogenic pain 02/14/2018   Abnormal MRI, lumbar spine 02/14/2018   History of allergy to shellfish 01/27/2018   Morbid obesity with BMI of 45.0-49.9, adult (HCC) 12/13/2017   Swelling of limb 11/26/2017   Lower limb ulcer, calf, left, limited to breakdown of skin (HCC) 11/26/2017   DM2 (diabetes mellitus, type 2) (HCC) 11/03/2017   Neurogenic bladder 11/03/2017   Spondylosis without myelopathy or  radiculopathy, lumbar region 11/03/2017   Lumbar facet syndrome (Bilateral) (R>L) 11/03/2017   DDD (degenerative disc disease), lumbar 11/02/2017   Lumbar facet hypertrophy (Bilateral) 11/02/2017   Lumbar central spinal stenosis (L4-5) 11/02/2017   Lumbar lateral recess stenosis (L4-5) (Bilateral) (L>R) 11/02/2017   Vitamin D  insufficiency 11/02/2017   Lumbar spondylosis 11/02/2017   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   Grief 10/18/2017   Chronic low back pain (Bilateral) (R>L) w/ sciatica (Bilateral) 10/07/2017   Chronic lower extremity pain (Secondary Area of Pain) (Bilateral) (R>L) 10/07/2017   Chronic  pain syndrome 10/07/2017   Pharmacologic therapy 10/07/2017   Disorder of skeletal system 10/07/2017   Problems influencing health status 10/07/2017   Barrett's esophagus without dysplasia 03/10/2017   Colon cancer screening 10/14/2016   Essential hypertension 02/11/2016   Hyperlipidemia 02/11/2016   Tobacco dependence 02/11/2016   Obesity 02/11/2016   Lower limb ulcer, ankle, left, with fat layer exposed (HCC) 02/11/2016   Lymphedema 02/11/2016   Cellulitis 01/06/2016   PCP:  Evette Hoes, NP Pharmacy:   [facility] - [place], [place] - [address] [place] [address] Phone: [phone] Fax: [phone]  [facility] [address],  - [address] [place] [address] Phone: [phone] Fax: [phone]  [facility] - [place], [place] - [address] [place] [address] Phone: [phone] Fax: [phone]  [facility] [address] [place] [address] Phone: [phone] Fax: [phone]     Social Drivers of Health (SDOH) Social History: SDOH Screenings   Food Insecurity: Food Insecurity Present (05/08/2023)  Housing: High Risk (05/08/2023)  Transportation Needs: Unmet Transportation Needs (05/08/2023)  Utilities: Not At Risk (05/08/2023)  Recent Concern: Utilities - At Risk (05/08/2023)  Depression (PHQ2-9): High Risk (12/09/2020)  Social Connections: Socially Isolated (05/08/2023)  Tobacco Use: Medium Risk (05/08/2023)   SDOH Interventions:     Readmission Risk Interventions     No data to display

## 2023-05-10 NOTE — Progress Notes (Signed)
 Nutrition Brief Note  RD received consult for nutritional assessment   58 y/o transgender male with h/o COPD, HTN, CAD, DM, PE and MDD who is admitted with COPD  exacerbation and rhinovirus infection.   Wt Readings from Last 15 Encounters:  05/08/23 109.3 kg  05/05/23 109.3 kg  05/13/22 121.4 kg  11/10/21 115.3 kg  08/25/21 133.4 kg  05/15/21 135.2 kg  03/21/21 132.9 kg  01/19/21 133.4 kg  09/30/20 (!) 143.9 kg  07/22/20 (!) 147 kg  03/22/20 (!) 138.3 kg  04/21/19 (!) 143.3 kg  03/31/19 (!) 141.2 kg  03/21/19 (!) 140.6 kg  03/09/19 (!) 141.5 kg    Body mass index is 36.64 kg/m. Patient meets criteria for obesity based on current BMI.   Current diet order is regular, patient is consuming approximately 90-100% of meals at this time. Labs and medications reviewed. Pt to discharge today.   No nutrition interventions warranted at this time. If nutrition issues arise, please consult RD.   Jonathan Freestone MS, RD, LDN If unable to be reached, please send secure chat to "RD inpatient" available from 8:00a-4:00p daily

## 2023-05-10 NOTE — Plan of Care (Signed)
  Problem: Clinical Measurements: Goal: Respiratory complications will improve Outcome: Progressing   Problem: Education: Goal: Knowledge of General Education information will improve Description: Including pain rating scale, medication(s)/side effects and non-pharmacologic comfort measures Outcome: Progressing   Problem: Coping: Goal: Level of anxiety will decrease Outcome: Progressing

## 2023-05-12 LAB — BLOOD GAS, VENOUS
Acid-base deficit: 2.4 mmol/L — ABNORMAL HIGH (ref 0.0–2.0)
Bicarbonate: 23.2 mmol/L (ref 20.0–28.0)
Patient temperature: 37
pCO2, Ven: 42 mmHg — ABNORMAL LOW (ref 44–60)
pH, Ven: 7.35 (ref 7.25–7.43)
pO2, Ven: 46 mmHg — ABNORMAL HIGH (ref 32–45)

## 2023-06-01 ENCOUNTER — Encounter (INDEPENDENT_AMBULATORY_CARE_PROVIDER_SITE_OTHER): Payer: Self-pay

## 2024-01-26 ENCOUNTER — Telehealth (HOSPITAL_COMMUNITY): Payer: Self-pay

## 2024-01-26 ENCOUNTER — Telehealth (HOSPITAL_COMMUNITY): Payer: Self-pay | Admitting: Licensed Clinical Social Worker

## 2024-01-26 NOTE — Telephone Encounter (Signed)
 The therapist receives a message from this patient saying that she is inpatient in a psychiatric ward at [facility] and is interested in substance use treatment.  The therapist returns her call leaving a HIPAA-compliant message with the staff person who answers the phone who indicates that Delon is currently getting her medications so will call back.  Zell Maier, MA, LCSW, [facility], LCAS 01/26/2024

## 2024-01-26 NOTE — Telephone Encounter (Signed)
 Therapist attempts to call [name] again and this time reaches a voice mail. Therapist leaves a HIPAA compliance voice mail requesting a return call.  Darice Simpler, MS, LMFT, LCAS

## 2024-01-26 NOTE — Telephone Encounter (Signed)
 [name] left a message saying she was returning [name]. She leaves the number of [phone]. This therapist calls her back, however the number rings a fast busy.  Jonathan Simpler, MS, LMFT, LCAS

## 2024-02-11 ENCOUNTER — Encounter (HOSPITAL_COMMUNITY): Payer: Self-pay | Admitting: Licensed Clinical Social Worker

## 2024-02-11 ENCOUNTER — Telehealth (HOSPITAL_COMMUNITY): Payer: Self-pay | Admitting: Licensed Clinical Social Worker

## 2024-02-11 NOTE — Telephone Encounter (Signed)
 The therapist attempts to reach [facility] in response to her call about detox. The therapist is unable to leave a HIPAA-compliant voicemail as her voicemail box is full so sends correspondence via MyChart.  Zell Maier, MA, LCSW, [facility], LCAS 02/11/2024

## 2024-02-14 ENCOUNTER — Telehealth (HOSPITAL_COMMUNITY): Payer: Self-pay | Admitting: Licensed Clinical Social Worker

## 2024-02-14 NOTE — Telephone Encounter (Signed)
 This patient leaves another voicemail about wanting to enter our detox problem. The therapist attempts to reach this patient but is unable to do so at this time because of calling restrictions on the number. The therapist has sent correspondence to this patient via MyChart explaining how to access detox 24/7 at the [facility].  Zell Maier, MA, LCSW, [facility], LCAS 02/14/2024
